# Patient Record
Sex: Male | Born: 1945 | Race: White | Hispanic: No | Marital: Married | Smoking: Former smoker
Health system: Southern US, Community
[De-identification: ages and names within clinical notes are randomized; demographics above are authoritative.]

## PROBLEM LIST (undated history)

## (undated) DIAGNOSIS — I2510 Atherosclerotic heart disease of native coronary artery without angina pectoris: Secondary | ICD-10-CM

## (undated) DIAGNOSIS — R739 Hyperglycemia, unspecified: Secondary | ICD-10-CM

## (undated) DIAGNOSIS — R7612 Nonspecific reaction to cell mediated immunity measurement of gamma interferon antigen response without active tuberculosis: Secondary | ICD-10-CM

## (undated) DIAGNOSIS — E785 Hyperlipidemia, unspecified: Secondary | ICD-10-CM

## (undated) DIAGNOSIS — R300 Dysuria: Secondary | ICD-10-CM

## (undated) DIAGNOSIS — M25552 Pain in left hip: Secondary | ICD-10-CM

## (undated) DIAGNOSIS — M353 Polymyalgia rheumatica: Secondary | ICD-10-CM

## (undated) DIAGNOSIS — E119 Type 2 diabetes mellitus without complications: Secondary | ICD-10-CM

## (undated) DIAGNOSIS — D689 Coagulation defect, unspecified: Principal | ICD-10-CM

## (undated) DIAGNOSIS — F419 Anxiety disorder, unspecified: Secondary | ICD-10-CM

## (undated) DIAGNOSIS — T7840XA Allergy, unspecified, initial encounter: Secondary | ICD-10-CM

## (undated) DIAGNOSIS — I82409 Acute embolism and thrombosis of unspecified deep veins of unspecified lower extremity: Secondary | ICD-10-CM

## (undated) DIAGNOSIS — Z7901 Long term (current) use of anticoagulants: Principal | ICD-10-CM

## (undated) DIAGNOSIS — G4700 Insomnia, unspecified: Secondary | ICD-10-CM

## (undated) DIAGNOSIS — G4730 Sleep apnea, unspecified: Secondary | ICD-10-CM

## (undated) DIAGNOSIS — E559 Vitamin D deficiency, unspecified: Secondary | ICD-10-CM

## (undated) DIAGNOSIS — E041 Nontoxic single thyroid nodule: Secondary | ICD-10-CM

## (undated) DIAGNOSIS — R05 Cough: Secondary | ICD-10-CM

## (undated) DIAGNOSIS — J3489 Other specified disorders of nose and nasal sinuses: Secondary | ICD-10-CM

## (undated) DIAGNOSIS — N529 Male erectile dysfunction, unspecified: Secondary | ICD-10-CM

## (undated) DIAGNOSIS — D6859 Other primary thrombophilia: Secondary | ICD-10-CM

## (undated) DIAGNOSIS — R3911 Hesitancy of micturition: Secondary | ICD-10-CM

## (undated) DIAGNOSIS — I739 Peripheral vascular disease, unspecified: Secondary | ICD-10-CM

## (undated) DIAGNOSIS — B029 Zoster without complications: Secondary | ICD-10-CM

## (undated) DIAGNOSIS — L089 Local infection of the skin and subcutaneous tissue, unspecified: Secondary | ICD-10-CM

## (undated) DIAGNOSIS — M359 Systemic involvement of connective tissue, unspecified: Secondary | ICD-10-CM

## (undated) DIAGNOSIS — G3184 Mild cognitive impairment, so stated: Secondary | ICD-10-CM

## (undated) DIAGNOSIS — K219 Gastro-esophageal reflux disease without esophagitis: Secondary | ICD-10-CM

## (undated) DIAGNOSIS — E039 Hypothyroidism, unspecified: Secondary | ICD-10-CM

## (undated) DIAGNOSIS — Z0000 Encounter for general adult medical examination without abnormal findings: Secondary | ICD-10-CM

## (undated) DIAGNOSIS — B958 Unspecified staphylococcus as the cause of diseases classified elsewhere: Secondary | ICD-10-CM

## (undated) HISTORY — DX: Unspecified staphylococcus as the cause of diseases classified elsewhere: B95.8

## (undated) HISTORY — DX: Other primary thrombophilia: D68.59

## (undated) HISTORY — DX: Hypothyroidism, unspecified: E03.9

## (undated) HISTORY — DX: Mild cognitive impairment, so stated: G31.84

## (undated) HISTORY — DX: Anxiety disorder, unspecified: F41.9

## (undated) HISTORY — DX: Nontoxic single thyroid nodule: E04.1

## (undated) HISTORY — DX: Insomnia, unspecified: G47.00

## (undated) HISTORY — DX: Polymyalgia rheumatica: M35.3

## (undated) HISTORY — DX: Coagulation defect, unspecified: D68.9

## (undated) HISTORY — DX: Dysuria: R30.0

## (undated) HISTORY — PX: OTHER SURGICAL HISTORY: SHX169

## (undated) HISTORY — DX: Pain in left hip: M25.552

## (undated) HISTORY — DX: Allergy, unspecified, initial encounter: T78.40XA

## (undated) HISTORY — DX: Nonspecific reaction to cell mediated immunity measurement of gamma interferon antigen response without active tuberculosis: R76.12

## (undated) HISTORY — DX: Hesitancy of micturition: R39.11

## (undated) HISTORY — DX: Hyperlipidemia, unspecified: E78.5

## (undated) HISTORY — DX: Zoster without complications: B02.9

## (undated) HISTORY — DX: Local infection of the skin and subcutaneous tissue, unspecified: L08.9

## (undated) HISTORY — DX: Encounter for general adult medical examination without abnormal findings: Z00.00

## (undated) HISTORY — DX: Cough: R05

## (undated) HISTORY — DX: Long term (current) use of anticoagulants: Z79.01

## (undated) HISTORY — DX: Acute embolism and thrombosis of unspecified deep veins of unspecified lower extremity: I82.409

## (undated) HISTORY — DX: Gastro-esophageal reflux disease without esophagitis: K21.9

## (undated) HISTORY — DX: Other specified disorders of nose and nasal sinuses: J34.89

## (undated) HISTORY — DX: Vitamin D deficiency, unspecified: E55.9

## (undated) HISTORY — PX: COLONOSCOPY: SHX174

## (undated) HISTORY — DX: Male erectile dysfunction, unspecified: N52.9

## (undated) HISTORY — PX: CATARACT EXTRACTION: SUR2

## (undated) HISTORY — DX: Hyperglycemia, unspecified: R73.9

---

## 1980-03-03 DIAGNOSIS — B029 Zoster without complications: Secondary | ICD-10-CM

## 1980-03-03 HISTORY — DX: Zoster without complications: B02.9

## 2003-02-27 ENCOUNTER — Ambulatory Visit (HOSPITAL_COMMUNITY): Admission: RE | Admit: 2003-02-27 | Discharge: 2003-02-27 | Payer: Self-pay | Admitting: Orthopedic Surgery

## 2003-03-23 ENCOUNTER — Ambulatory Visit (HOSPITAL_BASED_OUTPATIENT_CLINIC_OR_DEPARTMENT_OTHER): Admission: RE | Admit: 2003-03-23 | Discharge: 2003-03-23 | Payer: Self-pay | Admitting: Orthopedic Surgery

## 2003-03-23 ENCOUNTER — Ambulatory Visit (HOSPITAL_COMMUNITY): Admission: RE | Admit: 2003-03-23 | Discharge: 2003-03-23 | Payer: Self-pay | Admitting: Orthopedic Surgery

## 2003-03-24 ENCOUNTER — Encounter (INDEPENDENT_AMBULATORY_CARE_PROVIDER_SITE_OTHER): Payer: Self-pay | Admitting: *Deleted

## 2005-11-19 ENCOUNTER — Encounter: Admission: RE | Admit: 2005-11-19 | Discharge: 2005-11-19 | Payer: Self-pay | Admitting: Family Medicine

## 2007-11-16 ENCOUNTER — Encounter: Admission: RE | Admit: 2007-11-16 | Discharge: 2007-11-16 | Payer: Self-pay | Admitting: Unknown Physician Specialty

## 2008-08-29 ENCOUNTER — Encounter: Admission: RE | Admit: 2008-08-29 | Discharge: 2008-08-29 | Payer: Self-pay | Admitting: Orthopedic Surgery

## 2008-09-08 ENCOUNTER — Ambulatory Visit: Payer: Self-pay | Admitting: Oncology

## 2008-10-20 ENCOUNTER — Encounter: Admission: RE | Admit: 2008-10-20 | Discharge: 2008-10-20 | Payer: Self-pay | Admitting: Internal Medicine

## 2008-10-23 ENCOUNTER — Ambulatory Visit: Payer: Self-pay | Admitting: Oncology

## 2008-10-24 LAB — CBC & DIFF AND RETIC
Basophils Absolute: 0 10*3/uL (ref 0.0–0.1)
HCT: 44.3 % (ref 38.4–49.9)
HGB: 15.3 g/dL (ref 13.0–17.1)
Immature Retic Fract: 8.5 % (ref 0.00–13.40)
LYMPH%: 38.1 % (ref 14.0–49.0)
MCHC: 34.5 g/dL (ref 32.0–36.0)
MONO#: 0.3 10*3/uL (ref 0.1–0.9)
NEUT%: 54.9 % (ref 39.0–75.0)
Platelets: 180 10*3/uL (ref 140–400)
Retic Ct Abs: 54.17 10*3/uL (ref 24.10–77.50)
WBC: 5.6 10*3/uL (ref 4.0–10.3)
lymph#: 2.2 10*3/uL (ref 0.9–3.3)

## 2008-10-24 LAB — CHCC SMEAR

## 2008-10-24 LAB — MORPHOLOGY

## 2008-10-28 LAB — LUPUS ANTICOAGULANT PANEL
DRVVT 1:1 Mix: 38.7 secs (ref 36.1–47.0)
DRVVT: 75.7 secs — ABNORMAL HIGH (ref 36.1–47.0)
PTT Lupus Anticoagulant: 54.8 secs — ABNORMAL HIGH (ref 36.3–48.8)

## 2008-10-28 LAB — BETA-2 GLYCOPROTEIN ANTIBODIES: Beta-2-Glycoprotein I IgA: <10

## 2008-10-28 LAB — FACTOR 5 LEIDEN

## 2008-10-28 LAB — ANTITHROMBIN III: AntiThromb III Func: 102 % (ref 76–126)

## 2008-10-28 LAB — CARDIOLIPIN ANTIBODIES, IGG, IGM, IGA
Anticardiolipin IgA: <10
Anticardiolipin IgM: <10

## 2008-10-28 LAB — PROTHROMBIN GENE MUTATION

## 2009-04-19 ENCOUNTER — Ambulatory Visit: Payer: Self-pay | Admitting: Psychology

## 2009-04-20 ENCOUNTER — Ambulatory Visit: Payer: Self-pay | Admitting: Oncology

## 2009-04-24 LAB — CBC WITH DIFFERENTIAL/PLATELET
BASO%: 0.2 % (ref 0.0–2.0)
Basophils Absolute: 0 10*3/uL (ref 0.0–0.1)
EOS%: 2.1 % (ref 0.0–7.0)
Eosinophils Absolute: 0.1 10*3/uL (ref 0.0–0.5)
HCT: 45.2 % (ref 38.4–49.9)
HGB: 15.3 g/dL (ref 13.0–17.1)
LYMPH%: 19.6 % (ref 14.0–49.0)
MCH: 30.4 pg (ref 27.2–33.4)
MCHC: 33.7 g/dL (ref 32.0–36.0)
MCV: 90.1 fL (ref 79.3–98.0)
MONO#: 0.7 10*3/uL (ref 0.1–0.9)
MONO%: 9.5 % (ref 0.0–14.0)
NEUT#: 4.8 10*3/uL (ref 1.5–6.5)
NEUT%: 68.6 % (ref 39.0–75.0)
Platelets: 182 10*3/uL (ref 140–400)
RBC: 5.02 10*6/uL (ref 4.20–5.82)
RDW: 14.4 % (ref 11.0–14.6)
WBC: 7 10*3/uL (ref 4.0–10.3)
lymph#: 1.4 10*3/uL (ref 0.9–3.3)

## 2009-04-24 LAB — PROTIME-INR
INR: 2.7 (ref 2.00–3.50)
Protime: 32.4 Seconds — ABNORMAL HIGH (ref 10.6–13.4)

## 2009-04-25 LAB — FACTOR 8 ASSAY: Coagulation Factor VIII: 203 % — ABNORMAL HIGH (ref 73–140)

## 2009-04-25 LAB — D-DIMER, QUANTITATIVE: D-Dimer, Quant: 1.15 ug/mL-FEU — ABNORMAL HIGH (ref 0.00–0.48)

## 2009-06-21 ENCOUNTER — Ambulatory Visit: Payer: Self-pay | Admitting: Oncology

## 2009-06-22 LAB — CBC WITH DIFFERENTIAL/PLATELET
BASO%: 0.3 % (ref 0.0–2.0)
Basophils Absolute: 0 10*3/uL (ref 0.0–0.1)
EOS%: 3.2 % (ref 0.0–7.0)
Eosinophils Absolute: 0.2 10*3/uL (ref 0.0–0.5)
HCT: 45.8 % (ref 38.4–49.9)
HGB: 15.6 g/dL (ref 13.0–17.1)
LYMPH%: 28.7 % (ref 14.0–49.0)
MCH: 30.9 pg (ref 27.2–33.4)
MCHC: 34.1 g/dL (ref 32.0–36.0)
MCV: 90.7 fL (ref 79.3–98.0)
MONO#: 0.4 10*3/uL (ref 0.1–0.9)
MONO%: 5.5 % (ref 0.0–14.0)
NEUT#: 4.8 10*3/uL (ref 1.5–6.5)
NEUT%: 62.3 % (ref 39.0–75.0)
Platelets: 192 10*3/uL (ref 140–400)
RBC: 5.06 10*6/uL (ref 4.20–5.82)
RDW: 14.4 % (ref 11.0–14.6)
WBC: 7.7 10*3/uL (ref 4.0–10.3)
lymph#: 2.2 10*3/uL (ref 0.9–3.3)

## 2009-06-22 LAB — PROTIME-INR
INR: 2.3 (ref 2.00–3.50)
Protime: 27.6 Seconds — ABNORMAL HIGH (ref 10.6–13.4)

## 2009-06-23 LAB — D-DIMER, QUANTITATIVE: D-Dimer, Quant: 0.92 ug/mL-FEU — ABNORMAL HIGH (ref 0.00–0.48)

## 2009-07-23 ENCOUNTER — Ambulatory Visit: Payer: Self-pay | Admitting: Oncology

## 2009-07-24 LAB — PROTIME-INR
INR: 1.4 — ABNORMAL LOW (ref 2.00–3.50)
Protime: 16.8 Seconds — ABNORMAL HIGH (ref 10.6–13.4)

## 2009-08-02 LAB — PROTIME-INR
INR: 2 (ref 2.00–3.50)
Protime: 24 Seconds — ABNORMAL HIGH (ref 10.6–13.4)

## 2009-09-27 ENCOUNTER — Ambulatory Visit: Payer: Self-pay | Admitting: Oncology

## 2009-10-12 LAB — CBC WITH DIFFERENTIAL/PLATELET
BASO%: 0.4 % (ref 0.0–2.0)
Basophils Absolute: 0 10*3/uL (ref 0.0–0.1)
EOS%: 2.2 % (ref 0.0–7.0)
Eosinophils Absolute: 0.1 10*3/uL (ref 0.0–0.5)
HCT: 42.7 % (ref 38.4–49.9)
HGB: 14.5 g/dL (ref 13.0–17.1)
LYMPH%: 31.5 % (ref 14.0–49.0)
MCH: 31.5 pg (ref 27.2–33.4)
MCHC: 33.9 g/dL (ref 32.0–36.0)
MCV: 92.9 fL (ref 79.3–98.0)
MONO#: 0.4 10*3/uL (ref 0.1–0.9)
MONO%: 6.2 % (ref 0.0–14.0)
NEUT#: 3.7 10*3/uL (ref 1.5–6.5)
NEUT%: 59.7 % (ref 39.0–75.0)
Platelets: 182 10*3/uL (ref 140–400)
RBC: 4.6 10*6/uL (ref 4.20–5.82)
RDW: 14.6 % (ref 11.0–14.6)
WBC: 6.3 10*3/uL (ref 4.0–10.3)
lymph#: 2 10*3/uL (ref 0.9–3.3)

## 2009-10-16 LAB — FIBRINOGEN: Fibrinogen: 270 mg/dL (ref 204–475)

## 2009-10-16 LAB — D-DIMER, QUANTITATIVE: D-Dimer, Quant: 0.68 ug/mL-FEU — ABNORMAL HIGH (ref 0.00–0.48)

## 2009-10-26 ENCOUNTER — Ambulatory Visit (HOSPITAL_BASED_OUTPATIENT_CLINIC_OR_DEPARTMENT_OTHER): Admission: RE | Admit: 2009-10-26 | Discharge: 2009-10-26 | Payer: Self-pay | Admitting: Orthopedic Surgery

## 2009-11-07 ENCOUNTER — Ambulatory Visit: Payer: Self-pay | Admitting: Oncology

## 2009-11-16 LAB — PROTEIN C, TOTAL: Protein C, Total: 114 % (ref 70–140)

## 2009-11-16 LAB — PROTEIN C ACTIVITY: Protein C Activity: 154 % — ABNORMAL HIGH (ref 75–133)

## 2009-11-16 LAB — PROTEIN S, ANTIGEN, FREE: Protein S Ag, Free: 60 % normal (ref 57–171)

## 2009-11-16 LAB — PROTEIN S, TOTAL: Protein S Ag, Total: 82 % (ref 70–140)

## 2009-11-16 LAB — PROTEIN S ACTIVITY: Protein S Activity: 48 % — ABNORMAL LOW (ref 69–129)

## 2009-11-16 LAB — FACTOR 8 ASSAY: Coagulation Factor VIII: 201 % — ABNORMAL HIGH (ref 73–140)

## 2009-12-28 ENCOUNTER — Ambulatory Visit: Payer: Self-pay | Admitting: Oncology

## 2010-01-04 LAB — PROTEIN S, TOTAL: Protein S Ag, Total: 87 % (ref 70–140)

## 2010-01-04 LAB — PROTEIN S, ANTIGEN, FREE: Protein S Ag, Free: 60 % normal (ref 57–171)

## 2010-01-04 LAB — PROTEIN S ACTIVITY: Protein S Activity: 48 % — ABNORMAL LOW (ref 69–129)

## 2010-01-04 LAB — D-DIMER, QUANTITATIVE: D-Dimer, Quant: 1.15 ug/mL-FEU — ABNORMAL HIGH (ref 0.00–0.48)

## 2010-02-01 ENCOUNTER — Ambulatory Visit: Payer: Self-pay | Admitting: Oncology

## 2010-02-04 LAB — PROTIME-INR
INR: 1.4 — ABNORMAL LOW (ref 2.00–3.50)
Protime: 16.8 Seconds — ABNORMAL HIGH (ref 10.6–13.4)

## 2010-04-26 ENCOUNTER — Other Ambulatory Visit (HOSPITAL_COMMUNITY): Payer: Self-pay | Admitting: Orthopedic Surgery

## 2010-04-26 DIAGNOSIS — Q2730 Arteriovenous malformation, site unspecified: Secondary | ICD-10-CM

## 2010-05-01 ENCOUNTER — Ambulatory Visit (HOSPITAL_COMMUNITY)
Admission: RE | Admit: 2010-05-01 | Discharge: 2010-05-01 | Disposition: A | Discharge status: Home / Self Care | Payer: BC Managed Care – PPO | Source: Ambulatory Visit | Attending: Orthopedic Surgery | Admitting: Orthopedic Surgery

## 2010-05-01 DIAGNOSIS — I770 Arteriovenous fistula, acquired: Secondary | ICD-10-CM | POA: Insufficient documentation

## 2010-05-01 DIAGNOSIS — Q2730 Arteriovenous malformation, site unspecified: Secondary | ICD-10-CM

## 2010-05-01 DIAGNOSIS — S68118A Complete traumatic metacarpophalangeal amputation of other finger, initial encounter: Secondary | ICD-10-CM | POA: Insufficient documentation

## 2010-05-01 MED ORDER — GADOBENATE DIMEGLUMINE 529 MG/ML IV SOLN
10.0000 mL | Freq: Once | INTRAVENOUS | Status: AC
  Administered 2010-05-01: 10 mL via INTRAVENOUS

## 2010-05-17 LAB — PROTIME-INR
INR: 0.96 (ref 0.00–1.49)
Prothrombin Time: 13 seconds (ref 11.6–15.2)

## 2010-05-17 LAB — APTT: aPTT: 27 s (ref 24–37)

## 2010-05-17 LAB — POCT HEMOGLOBIN-HEMACUE: Hemoglobin: 15.6 g/dL (ref 13.0–17.0)

## 2010-07-05 ENCOUNTER — Other Ambulatory Visit: Payer: Self-pay | Admitting: Oncology

## 2010-07-05 ENCOUNTER — Ambulatory Visit (HOSPITAL_COMMUNITY)
Admission: RE | Admit: 2010-07-05 | Discharge: 2010-07-05 | Disposition: A | Discharge status: Home / Self Care | Payer: BC Managed Care – PPO | Source: Ambulatory Visit | Attending: Oncology | Admitting: Oncology

## 2010-07-05 ENCOUNTER — Encounter (HOSPITAL_BASED_OUTPATIENT_CLINIC_OR_DEPARTMENT_OTHER): Payer: BC Managed Care – PPO | Admitting: Oncology

## 2010-07-05 DIAGNOSIS — Z86718 Personal history of other venous thrombosis and embolism: Secondary | ICD-10-CM

## 2010-07-05 DIAGNOSIS — I82409 Acute embolism and thrombosis of unspecified deep veins of unspecified lower extremity: Secondary | ICD-10-CM

## 2010-07-05 DIAGNOSIS — M79609 Pain in unspecified limb: Secondary | ICD-10-CM | POA: Insufficient documentation

## 2010-07-05 DIAGNOSIS — Z7901 Long term (current) use of anticoagulants: Secondary | ICD-10-CM

## 2010-07-05 LAB — CBC WITH DIFFERENTIAL/PLATELET
BASO%: 0.3 % (ref 0.0–2.0)
Basophils Absolute: 0 10*3/uL (ref 0.0–0.1)
EOS%: 2.5 % (ref 0.0–7.0)
Eosinophils Absolute: 0.2 10*3/uL (ref 0.0–0.5)
HCT: 45.2 % (ref 38.4–49.9)
HGB: 15.3 g/dL (ref 13.0–17.1)
LYMPH%: 24.7 % (ref 14.0–49.0)
MCH: 31.2 pg (ref 27.2–33.4)
MCHC: 33.8 g/dL (ref 32.0–36.0)
MCV: 92.3 fL (ref 79.3–98.0)
MONO#: 0.3 10*3/uL (ref 0.1–0.9)
MONO%: 4.9 % (ref 0.0–14.0)
NEUT#: 4.1 10*3/uL (ref 1.5–6.5)
NEUT%: 67.6 % (ref 39.0–75.0)
Platelets: 186 10*3/uL (ref 140–400)
RBC: 4.9 10*6/uL (ref 4.20–5.82)
RDW: 14.1 % (ref 11.0–14.6)
WBC: 6.1 10*3/uL (ref 4.0–10.3)
lymph#: 1.5 10*3/uL (ref 0.9–3.3)

## 2010-07-05 LAB — PROTIME-INR
INR: 2.5 (ref 2.00–3.50)
Protime: 30 Seconds — ABNORMAL HIGH (ref 10.6–13.4)

## 2010-07-05 LAB — D-DIMER, QUANTITATIVE: D-Dimer, Quant: 1 ug/mL-FEU — ABNORMAL HIGH (ref 0.00–0.48)

## 2010-07-19 NOTE — Op Note (Signed)
NAMECLAIR, Paul Ryan                               ACCOUNT NO.:  [id number]   MEDICAL RECORD NO.:  [id number]                   PATIENT TYPE:  AMB   LOCATION:  DSC                                  FACILITY:  [facility]   PHYSICIAN:  Katy Fitch. Naaman Plummer., M.D.          DATE OF BIRTH:  [birth year]   DATE OF PROCEDURE:  03/23/2003  DATE OF DISCHARGE:                                 OPERATIVE REPORT   PREOPERATIVE DIAGNOSIS:  Persistent/recurrent arteriovenous malformation  left hand involving radial aspect of the long finger metacarpal phalangeal  joint and long thumb web space status post ray resection for the same lesion  performed in 1971 in [place], [place].   POSTOPERATIVE DIAGNOSIS:  Persistent/recurrent arteriovenous malformation  left hand involving radial aspect of the long finger metacarpal phalangeal  joint and long thumb web space status post ray resection for the same lesion  performed in 1971 in [place], [place], with identification of extensive  dermal infiltration.   PROCEDURE:  1. Subtotal resection of skin overlying AV malformation as well as excision     of a portion of the former ulnar proper digital artery to the excised     index finger as well as near total marginal resection of visible     arteriovenous malformation.  2. Multiple Z-plasties for thumb and long finger web deepening and release     of scar web contracture.   SURGEON:  Katy Fitch. Sypher, M.D.   ASSISTANT:  Annye Rusk, P.A.   ANESTHESIA:  Axillary block.   ANESTHESIOLOGIST:  Quita Skye. Krista Blue, M.D.   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  This apparently was a predicament that was growing and causing symptomatic  impairment of the left hand.  He was initially treated for this lesion in  [place], [place], with a ray  resection at the level of the diaphysis of his  left index finger performed in 1971.  In the early postoperative period, he  had good function of his hand.  During the ensuing 30 years, he began to  notice some discoloration of the skin on the radial aspect of the long  finger just distal to the metacarpal phalangeal joint.  During the past  year, he had noted the progressive enlargement of what appeared to be a  vascular soft tissue mass on the radial aspect of his long finger metacarpal  phalangeal joint and T1 segment.  This was nontender but pulsatile.  He  brought this to the attention of his family physician, Dr. Smith Mince, and is  referred for an upper extremity orthopedic consult.   On February 13, 2003, he was seen in consultation and noted to have what  appeared to be a complex arteriovenous malformation.  We  had a lengthy  discussion regarding the difficulties in managing these lesions.  They are  often highly infiltrative and essentially dispersed throughout a limb.  On  exam, his appeared to be fairly discrete but clearly involved the common  digital artery and proper digital artery region to the radial aspect of the  long finger.  He was referred for an MRI of the hand with contrast and an MR  angiogram.  These studies were interpreted by Dr. Jena Gauss, radiologist, on  February 27, 2003.  Dr. Jena Gauss noted a relatively well circumscribed  vascular lesion that had clear arterial supply from the palmar digital  artery on the radial aspect of the third metacarpal and also some  contributions from the dorsal and palmar digital arteries of the second  metacarpal region.  Prominent draining veins were noted.   Clinically, this appeared to be a lesion that did infiltrate the dermis,  however, it appeared that a portion of the skin on the palmar radial aspect  of the long finger could be sacrificed with the lesion and allow for a near  total resection.  After a lengthy discussion on  March 01, 2003, Paul Ryan  elects to come to the operating room at this time for an attempt to debulk  and/or excise his arteriovenous malformation.  After informed consent, we  proceed.   PROCEDURE:  Paul Ryan is brought to the operating room and placed on supine  position on the operating table.  Following induction of axillary block by  Dr. Krista Blue in the holding area, anesthesia was complete in the left arm.  Paul Ryan was transported to the operating room and placed in supine position  on the operating table.  The left arm was placed on an arm table and a  pneumatic tourniquet was applied to the proximal brachium.  1 gram of Ancef  was administered as an IV prophylactic antibiotic.  Following exsanguination  of the limb with an Esmarch bandage, the arterial tourniquet on the proximal  brachium was inflated to 220 mmHg.  The procedure commenced with planning an  elliptical skin excision overlying the mass in an effort to remove the  abnormal vessels that had grown within the dermis.  The proximal and distal  limits of the prior surgical incision were extended to provide dissection in  a virgin plane to identify the radial proper digital artery and nerve of the  long finger and the common digital artery serving the index and long fingers  in the palm.  The skin incisions were taken sharply and immediately it was  apparent that the AV malformation diffusely involved the deep surface of the  dermis on a relatively extensive portion of the palmar skin overlying the  long finger metacarpal.  The radial carpal digital artery and nerve were  identified in the long finger distally and the thumb long finger web space  was carefully explored with release of multiple scar webs and the ligament  remnants allowing identification of the common digital vessel.  A very dense  scar was meticulously dissected with fine tenotomy scissors, 64 beaver blade, loupe magnification, and considerable use of a fine  hemostat for  spreading.  I was ultimately able to follow the radial proper digital artery  and nerve of the long finger down to the branch point where the common  digital vessels formally extending to the index finger.  It is clear that  the arteriovenous malformation involved the bifurcation of this region  rendering complete  eradication not a technically feasible option unless  sacrifice of the digital artery to the radial aspect of the long finger was  anticipated or bypassed grafting with the vein graft anticipated.  Given the  fact that the bulk of the lesion appeared to be confined to the subcutaneous  space and the subdermal and intradermal area, I performed a near 100%  resection by undermining the dermis with sharp dissection and a rongeur  followed by careful stripping of all the abnormal appearing vessels leaving  the proper digital artery to the long finger intact.  There was a small  plexus of vessels adjacent to the lumbrical to the long finger that was  resected.  The proximal feeding vessels were identified and  electrocauterized.  After removal of the lesion, the thumb, long finger web  was rather tight.  A full flap Z-plasty was created deepening the web and  lengthening the scar.  A small dart was taken distally to smooth the  proximal finger flexion crease.  The Z-plasty flaps were transposed and the  tourniquet released.  There was good capillary refill in the skin as well as  minimal bleeding.  There was noted to be some bleeding from the marginal  resection along the palmar aspect of the long finger which was due to  abnormal vessels within the dermis.  In my judgment, sacrifice of the skin  was not indicated given the nature of this lesion.  Electrocautery was used  to obtain hemostasis and coagulation of the abnormal vessels was  accomplished.  After thorough irrigation of the wound with sterile saline,  no pathologic bleeding points were identified.  The wound was  then closed  with intradermal 3-0 Prolene along the radial mid lateral incision on the  long finger followed by inset of the Z-plasties with multiple mattress  sutures of 5-0 nylon.  A very satisfactory thumb long finger web deepening  was accomplished.  The wounds were dressed with Silvadene, Xeroform, sterile  gauze, acrylic fluff, and Ace wrap.  There were no apparent complications.   Paul Ryan is advised to return for follow up at our office in approximately  six days for dressing change and examination of his wound margins.  His  lesion was passed off in formalin for pathologic evaluation.  We will  converse with the pathologist regarding the nature of the arteriovenous  malformation and suggestions for further care.                                               Katy Fitch Naaman Plummer., M.D.    RVS/MEDQ  D:  03/23/2003  T:  03/23/2003  Job:  [id number]   cc:   Talmadge Coventry, M.D.  [address]  [place] [address]  Fax: [phone]

## 2010-10-11 ENCOUNTER — Other Ambulatory Visit: Payer: Self-pay | Admitting: Oncology

## 2010-10-11 ENCOUNTER — Encounter (HOSPITAL_BASED_OUTPATIENT_CLINIC_OR_DEPARTMENT_OTHER): Payer: BC Managed Care – PPO | Admitting: Oncology

## 2010-10-11 DIAGNOSIS — Z7901 Long term (current) use of anticoagulants: Secondary | ICD-10-CM

## 2010-10-11 DIAGNOSIS — I82409 Acute embolism and thrombosis of unspecified deep veins of unspecified lower extremity: Secondary | ICD-10-CM

## 2010-10-11 DIAGNOSIS — Z86718 Personal history of other venous thrombosis and embolism: Secondary | ICD-10-CM

## 2010-10-11 LAB — CBC WITH DIFFERENTIAL/PLATELET
BASO%: 0.3 % (ref 0.0–2.0)
Basophils Absolute: 0 10*3/uL (ref 0.0–0.1)
EOS%: 2.9 % (ref 0.0–7.0)
Eosinophils Absolute: 0.2 10*3/uL (ref 0.0–0.5)
HCT: 44 % (ref 38.4–49.9)
HGB: 14.8 g/dL (ref 13.0–17.1)
LYMPH%: 30.8 % (ref 14.0–49.0)
MCH: 30.7 pg (ref 27.2–33.4)
MCHC: 33.6 g/dL (ref 32.0–36.0)
MCV: 91.3 fL (ref 79.3–98.0)
MONO#: 0.5 10*3/uL (ref 0.1–0.9)
MONO%: 7.3 % (ref 0.0–14.0)
NEUT#: 3.6 10*3/uL (ref 1.5–6.5)
NEUT%: 58.7 % (ref 39.0–75.0)
Platelets: 173 10*3/uL (ref 140–400)
RBC: 4.82 10*6/uL (ref 4.20–5.82)
RDW: 14.3 % (ref 11.0–14.6)
WBC: 6.1 10*3/uL (ref 4.0–10.3)
lymph#: 1.9 10*3/uL (ref 0.9–3.3)
nRBC: 0 % (ref 0–0)

## 2010-10-11 LAB — PROTIME-INR
INR: 2 (ref 2.00–3.50)
Protime: 24 Seconds — ABNORMAL HIGH (ref 10.6–13.4)

## 2010-10-12 LAB — D-DIMER, QUANTITATIVE: D-Dimer, Quant: 0.89 ug/mL-FEU — ABNORMAL HIGH (ref 0.00–0.48)

## 2010-10-29 ENCOUNTER — Encounter (HOSPITAL_BASED_OUTPATIENT_CLINIC_OR_DEPARTMENT_OTHER): Payer: BC Managed Care – PPO | Admitting: Oncology

## 2010-10-29 ENCOUNTER — Other Ambulatory Visit: Payer: Self-pay | Admitting: Oncology

## 2010-10-29 DIAGNOSIS — Z86718 Personal history of other venous thrombosis and embolism: Secondary | ICD-10-CM

## 2010-10-29 DIAGNOSIS — Z7901 Long term (current) use of anticoagulants: Secondary | ICD-10-CM

## 2010-10-29 DIAGNOSIS — I82409 Acute embolism and thrombosis of unspecified deep veins of unspecified lower extremity: Secondary | ICD-10-CM

## 2010-10-29 LAB — PROTIME-INR

## 2010-11-08 ENCOUNTER — Encounter (HOSPITAL_BASED_OUTPATIENT_CLINIC_OR_DEPARTMENT_OTHER): Payer: BC Managed Care – PPO | Admitting: Oncology

## 2010-11-08 ENCOUNTER — Other Ambulatory Visit: Payer: Self-pay | Admitting: Oncology

## 2010-11-08 DIAGNOSIS — Z5181 Encounter for therapeutic drug level monitoring: Secondary | ICD-10-CM

## 2010-11-08 DIAGNOSIS — Z86718 Personal history of other venous thrombosis and embolism: Secondary | ICD-10-CM

## 2010-11-08 DIAGNOSIS — Z7901 Long term (current) use of anticoagulants: Secondary | ICD-10-CM

## 2010-11-08 LAB — PROTIME-INR: Protime: 21.6 Seconds — ABNORMAL HIGH (ref 10.6–13.4)

## 2010-11-29 ENCOUNTER — Other Ambulatory Visit: Payer: Self-pay | Admitting: Oncology

## 2010-11-29 ENCOUNTER — Encounter (HOSPITAL_BASED_OUTPATIENT_CLINIC_OR_DEPARTMENT_OTHER): Payer: BC Managed Care – PPO | Admitting: Oncology

## 2010-11-29 DIAGNOSIS — I82409 Acute embolism and thrombosis of unspecified deep veins of unspecified lower extremity: Secondary | ICD-10-CM

## 2010-11-29 DIAGNOSIS — Z7901 Long term (current) use of anticoagulants: Secondary | ICD-10-CM

## 2010-11-29 DIAGNOSIS — Z86718 Personal history of other venous thrombosis and embolism: Secondary | ICD-10-CM

## 2010-11-29 LAB — PROTIME-INR
INR: 2.8 (ref 2.00–3.50)
Protime: 33.6 Seconds — ABNORMAL HIGH (ref 10.6–13.4)

## 2010-12-17 ENCOUNTER — Encounter: Payer: Self-pay | Admitting: Family Medicine

## 2010-12-17 ENCOUNTER — Ambulatory Visit (INDEPENDENT_AMBULATORY_CARE_PROVIDER_SITE_OTHER): Payer: BC Managed Care – PPO | Admitting: Family Medicine

## 2010-12-17 VITALS — BP 98/62 | HR 102 | Temp 97.8°F | Ht 71.0 in | Wt 157.8 lb

## 2010-12-17 DIAGNOSIS — Z719 Counseling, unspecified: Secondary | ICD-10-CM | POA: Insufficient documentation

## 2010-12-17 DIAGNOSIS — B029 Zoster without complications: Secondary | ICD-10-CM | POA: Insufficient documentation

## 2010-12-17 DIAGNOSIS — T7840XA Allergy, unspecified, initial encounter: Secondary | ICD-10-CM

## 2010-12-17 DIAGNOSIS — M25552 Pain in left hip: Secondary | ICD-10-CM | POA: Insufficient documentation

## 2010-12-17 DIAGNOSIS — I82409 Acute embolism and thrombosis of unspecified deep veins of unspecified lower extremity: Secondary | ICD-10-CM

## 2010-12-17 DIAGNOSIS — Z23 Encounter for immunization: Secondary | ICD-10-CM

## 2010-12-17 DIAGNOSIS — E785 Hyperlipidemia, unspecified: Secondary | ICD-10-CM | POA: Insufficient documentation

## 2010-12-17 DIAGNOSIS — M25559 Pain in unspecified hip: Secondary | ICD-10-CM

## 2010-12-17 DIAGNOSIS — Z0000 Encounter for general adult medical examination without abnormal findings: Secondary | ICD-10-CM

## 2010-12-17 HISTORY — DX: Encounter for general adult medical examination without abnormal findings: Z00.00

## 2010-12-17 HISTORY — DX: Pain in left hip: M25.552

## 2010-12-17 MED ORDER — TETANUS-DIPHTH-ACELL PERTUSSIS 5-2.5-18.5 LF-MCG/0.5 IM SUSP: 0.5000 mL | Freq: Once | INTRAMUSCULAR | Status: DC

## 2010-12-17 NOTE — Assessment & Plan Note (Signed)
Had an episode years ago and since then has had a Zostavax, we will request records from his previous PMDs

## 2010-12-17 NOTE — Assessment & Plan Note (Signed)
Uses Zyrtec prn, may take daily and a second dose prn for severe allergies, saline nasal spray prn

## 2010-12-17 NOTE — Assessment & Plan Note (Signed)
Pain is worst when he sleeps on that hip, is pained for roughly an hour upon arising then dissipates for the day, likely related to arthritic changes. May consider Glucosamine Chondroitin and Tylenol prn

## 2010-12-17 NOTE — Assessment & Plan Note (Signed)
Has been maintained on Vytorin 10/40 for 20 years, his cholesterol was never strikingly hi, maybe 210 and his most recent cholesterol was 138 as a result he has dropped his Vytorin to 1/2 tab, we will check a lipid panel in 6-8 weeks, continue fish oil and avoid trans fats.

## 2010-12-17 NOTE — Progress Notes (Signed)
Paul Ryan [id number] 1945-07-19 12/17/2010      Progress Note New Patient  Subjective  Chief Complaint  Chief Complaint  Patient presents with  . Establish Care    new patient    HPI  Patient is a 65 yo male in today for new patient appt. He is doing well at the present time. He has not had any recent acute illness, fevers, chills, HA, CP, palpitations, GI or GU c/o. Had colonoscopy in 2007. He has been struggling with left hip pain in am when he lies on it, pain last about an hour and then resolves. 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 He had a congenital vascular malformation in his left 2nd finger which resulted in an amputation and then several vascular revisions at the stump. 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  Past Medical History  Diagnosis Date  . DVT (deep venous thrombosis)     takes coumadin  . Shingles 1982    right abdominal wall  . Allergy     seasonal  . Hyperlipidemia   . Left hip pain 12/17/2010  . Preventative health care 12/17/2010    Past Surgical History  Procedure Date  . 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 Cancer Mother     unknown- everywhere  . Cancer Sister     breast    History   Social History  . Marital Status: Married    Spouse Name: N/A    Number of Children: N/A  . Years of Education: N/A   Occupational History  . Not on file.   Social History Main Topics  . Smoking status: Former Smoker -- 1.0 packs/day for 20 years    Types: Cigarettes    Quit date: 03/03/1980  . Smokeless tobacco: Never Used  . Alcohol Use: No  . Drug Use: No  . Sexually Active: Yes -- Male partner(s)   Other Topics Concern  . Not on file   Social History Narrative  . No narrative on file    No current outpatient  prescriptions on file prior to visit.   No current facility-administered medications on file prior to visit.    No Known Allergies  Review of Systems  Review of Systems  Constitutional: Negative for fever, chills and malaise/fatigue.  HENT: Negative for hearing loss, nosebleeds and congestion.   Eyes: Negative for discharge.  Respiratory: Negative for cough, sputum production, shortness of breath and wheezing.   Cardiovascular: Negative for chest pain, palpitations, claudication and leg swelling.       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 Had a congenital abnormality of the vasculature in his left second finger at birth and has had 4 surgeries to amputate the finger and control varicosities.  Gastrointestinal: Negative for heartburn, nausea, vomiting, abdominal pain, diarrhea, constipation and blood in stool.  Genitourinary: Negative for dysuria, urgency, frequency and hematuria.  Musculoskeletal: Negative for myalgias, back pain and falls.  Skin: Negative for rash.  Neurological: Negative for dizziness, tremors, sensory change, focal weakness, loss of consciousness, weakness and headaches.  Endo/Heme/Allergies: Negative for polydipsia. Does not bruise/bleed easily.  Psychiatric/Behavioral: Negative for depression and suicidal ideas. The patient is not nervous/anxious and does not have insomnia.  Objective  BP 98/62  Pulse 102  Temp(Src) 97.8 F (36.6 C) (Oral)  Ht 5\' 11"  (1.803 m)  Wt 157 lb 12.8 oz (71.578 kg)  BMI 22.01 kg/m2  SpO2 97%  Physical Exam  Physical Exam  Constitutional: He is oriented to person, place, and time and well-developed, well-nourished, and in no distress. No distress.  HENT:  Head: Normocephalic and atraumatic.  Right Ear: External ear normal.  Left Ear: External ear normal.  Nose: Nose normal.  Mouth/Throat: Oropharynx is clear and moist. No oropharyngeal exudate.  Eyes: Conjunctivae and EOM are normal. Pupils are equal, round, and  reactive to light. Right eye exhibits no discharge. Left eye exhibits no discharge. No scleral icterus.  Neck: Neck supple. No thyromegaly present.  Cardiovascular: Normal rate, regular rhythm and normal heart sounds.   No murmur heard.      Nontender, soft, nonerythematous varicosities on arms and trunk  Pulmonary/Chest: Effort normal and breath sounds normal. No respiratory distress.  Abdominal: He exhibits no distension and no mass. There is no tenderness.  Musculoskeletal: Normal range of motion. He exhibits no edema and no tenderness.  Lymphadenopathy:    He has no cervical adenopathy.  Neurological: He is alert and oriented to person, place, and time. He has normal reflexes. He displays normal reflexes. No cranial nerve deficit. Gait normal. Coordination normal.  Skin: Skin is warm.  Psychiatric: Memory, affect and judgment normal.       Assessment & Plan  Preventative health care Tdap given today, patient refuses flu shot. Encouraged heart healthy diet such as DASH diet  Shingles Had an episode years ago and since then has had a Zostavax, we will request records from his previous PMDs  Allergic state Uses Zyrtec prn, may take daily and a second dose prn for severe allergies, saline nasal spray prn  Left hip pain Pain is worst when he sleeps on that hip, is pained for roughly an hour upon arising then dissipates for the day, likely related to arthritic changes. May consider Glucosamine Chondroitin and Tylenol prn  Hyperlipidemia Has been maintained on Vytorin 10/40 for 20 years, his cholesterol was never strikingly hi, maybe 210 and his most recent cholesterol was 138 as a result he has dropped his Vytorin to 1/2 tab, we will check a lipid panel in 6-8 weeks, continue fish oil and avoid trans fats.  DVT (deep venous thrombosis) Has been maintained on Coumadin for past 2 years s/p a right Femoral artery DVT after a long plane ride. He will continue to have his PT/INR checked  by Dr Ileene Hutchinson for now.

## 2010-12-17 NOTE — Patient Instructions (Addendum)
Preventative Care for Adults, Male MAINTAIN REGULAR HEALTH EXAMS  A routine yearly physical is a good way to check in with your primary care provider about your health and preventive screening. It is also an opportunity to share updates about your health and any concerns you have and receive a thorough all-over exam.   If you smoke or chew tobacco, find out from your caregiver how to quit. It can literally save your life, no matter how long you have been a tobacco user. If you do not use tobacco, never begin.   Maintain a healthy diet and normal weight. Increased weight leads to problems with blood pressure and diabetes. Decrease saturated fat in the diet and increase regular exercise. Get information about proper diet from your caregiver if necessary. Eat a variety of foods, including fruit, vegetables, animal or vegetable protein, such as meat, fish, chicken, and eggs, or beans, lentils, tofu, and grains, such as rice.   High blood pressure causes heart and blood vessel problems. Fat leaves deposits in your arteries that can block them. This causes heart disease and vessel disease elsewhere in your body. Check your blood pressure regularly and keep it within normal limits. Men over age 51 or those who have a family history of high blood pressure should have it checked at least every year.   Aerobic exercise helps maintain good heart health. 30 minutes of moderate-intensity exercise is recommended. For example, a brisk walk that increases your heart rate and breathing. This walk should be done on most days of the week. Persistent high blood pressure should be treated with medicine if weight loss and exercise do not work.   For many men aged 49 and older, having a cholesterol test of the blood every 5 years is recommended. If your cholesterol is found to be borderline high, or if you have heart disease or certain other medical conditions, then you may need to have it monitored more frequently.   Avoid  smoking, drinking alcohol in excess (more than two drinks per day) or use of street drugs. Do not share needles with anyone. Ask for professional help if you need assistance or instructions on stopping the use of alcohol, cigarettes, and/or drugs.   Maintain normal blood lipids and cholesterol by minimizing your intake of saturated fat. Eat a well rounded diet otherwise, with plenty of fruit and vegetables. The [employer] encourage men to eat 5-9 servings of fruit and vegetables each day. Your caregiver can help you keep your risk of heart disease or stroke at a lower level.   Ask your caregiver if you are in need of earlier testing because of: a strong family history of heart disease, or you have signs of elevated testosterone (male sex hormone) levels. This can predispose you to earlier heart disease. Ask if you should have a stress test if your history suggests this. A stress test is a test done on a treadmill that looks for heart disease. This test can find disease prior to there being a problem.   Diabetes screening assesses your blood sugar level after a fasting once every 3 years after age 53 if previous tests were normal.   Most routine colon cancer screening begins at the age of 67. On a yearly basis, doctors may provide special easy to use take-home tests to check for hidden blood in the stool. Sigmoidoscopy or colonoscopy can detect the earliest forms of colon cancer and is life saving. These test use a small camera at  the end of a tube to directly examine the colon. Speak to your caregiver about this at age 33, when routine screening begins (and is repeated every 5 years unless early forms of pre-cancerous polyps or small growths are found).   At the age of 43 men usually start screening for prostate cancer every year. Screening may begin at a younger age for those with higher risk. Those at higher risk include African-Americans or having a family history of prostate cancer.  There are two types of tests for prostate cancer:   Prostate-specific antigen (PSA) testing. Recent studies raise questions about prostate cancer using PSA and you should discuss this with your caregiver.   Digital rectal exam (in which your doctor's lubricated and gloved finger feels for enlargement of the prostate through the anus).   Practice safe sex. Use condoms. Condoms are used for birth control and to help reduce the spread of sexually transmitted infections (or STIs). Unsafe sex is having an unprotected physical relationship with someone who is bisexual, homosexual, uses intravenous street drugs, or going with someone who has sexual relations with high-risk groups. Practicing safe sex helps you avoid getting an STI. Some of the STIs are gonorrhea (the clap), chlamydia, syphilis, trichimonas, herpes, HPV (human papiloma virus) and HIV (human immunodeficiency virus) which causes AIDS. The herpes, HIV and HPV are viral illnesses that have no cure. These can result in disability, cancer and death.   It is not safe for someone who has AIDS or is HIV positive to have unprotected sex with someone else who is positive. The reason for this is the fact that there are many different strains of HIV. If you have a strain that is readily treated with medications and then suddenly introduce a strain from a partner that has no further treatment options, you may suddenly have a strain of HIV that is untreatable. Even if you are both positive for HIV, it is still necessary to practice safe sex.   Use sunscreen with a SPF (or skin protection factor) of 15 or greater. Apply sunscreen liberally and repeatedly throughout the day. Being outside in the sun when your shadow caused by the sun is shorter than you are, means you are being exposed to sun at greater intensity. Lighter skinned people are at a greater risk of skin cancer. Don't forget to also wear sunglasses in order to protect your eyes from too much damaging  sunlight. Damaging sunlight can accelerate cataract formation.   Once a month do a whole body skin exam or review, using a mirror to look at your back. Notify your caregivers of changes in moles, especially if there are changes in shapes, colors, a size larger than a pencil eraser, an irregular border, or development of new moles.   Keep carbon monoxide and smoke detectors in your home functioning at all times. Change the batteries every 6 months or use a model that plugs into the wall.   Do a monthly exam of your testicles. Gently roll each testicle between your thumb and fingers, feeling for any abnormal lumps. The best time to do this is after a hot shower or bath when the tissues are looser. Notify your caregivers of any lumps, tenderness or changes in size or shape immediately.   Stay up to date with your tetanus shots and other required immunizations. You should have a booster for tetanus every 10 years. Be sure to get your flu shot every year, since 5%-20% of the [place] population comes down with  the flu. The flu vaccine changes each year, so being vaccinated once is not enough. Get your shot in the fall, before the flu season peaks. The table below lists important vaccines to get. Other vaccines to consider include:   Hepatitis A virus (to prevent a form of infection of the liver by a virus acquired from food), Varicella Zoster (a virus that causes shingles).   Meningococcal (against bacteria which cause a form of meningitis).   Brush your teeth twice a day with fluoride toothpaste, and floss once a day. Good oral hygiene prevents tooth decay and gum disease. The problems can be painful, unattractive, and can cause other health problems. Visit your dentist for a routine oral and dental check up and preventive care every 6-12 months.   The Body Mass Index or BMI is a way of measuring how much of your body is fat. Having a BMI above 27 increases the risk of heart disease, diabetes, hypertension,  stroke and other problems related to obesity. Your caregiver can help determine your BMI and based on it develop an exercise and dietary program to help you achieve or maintain this important measurement at a healthful level.   Wear seat belts whenever in a vehicle, whether a passenger or driver, and even for very short drives of a few minutes.   If you bicycle, wear a helmet at all times.   Below is a summary of the most important preventative healthcare services that adult males should seek on a regular basis throughout their lives:  Preventative Care for Adult Males  Preventative Service Ages 74-39 Ages 38-64 Ages 68 and over  Schedule of medical visits Every 5 years Every 5 years   Schedule of dental visits Every 6-12 months Every 6-12 months   Health risk assessment and lifestyle counseling Every 3-5 years Every 3-5 years Every 3-5 years  Blood pressure check** Every 2 years Every 2 years Every 2 years  Total cholesterol check including HDL** Every 5 years beginning at age 69 Every 5 years Every 5 years through age 81, then optional.  Flexible sigmoidoscopy or colonoscopy**  Every 5 years beginning at age 76 Every 5 years through age 67, then optional.  Prostate screening  Every year beginning at age 60 Every Year  Testicular exam Monthly Monthly Monthly  FOBT (fecal occult blood test)  Every year beginning at age 13 Every year until 76, then optional.  Skin self-exam Monthly Monthly Monthly  Tetanus-diphtheria (Td) immunization Every 10 years Every 10 years Every 10 years  Influenza immunization** Every year Every year Every year  Pneumococcal immunization** Optional Optional Every 5 years  Hepatitis B immunization** Series of 3 immunizations (if not done previously, usually given at 0, 1 to 2, and 4 to 6 months) Check with your caregiver if vaccination not previously given.  Check with your caregiver if vaccination not previously given.   **Family history and personal history of risk  and conditions may change your physician's recommendations.  Document Released: 04/15/2001 Document Re-Released: 05/14/2009 [facility] Patient Information 2011 [place], [place].   For allergies try Zyrtec daily as needed and on a very bad day may take a second dose, nasal saline as needed

## 2010-12-17 NOTE — Assessment & Plan Note (Signed)
Has been maintained on Coumadin for past 2 years s/p a right Femoral artery DVT after a long plane ride. He will continue to have his PT/INR checked by Dr Ileene Hutchinson for now.

## 2010-12-17 NOTE — Assessment & Plan Note (Signed)
Tdap given today, patient refuses flu shot. Encouraged heart healthy diet such as DASH diet

## 2011-01-21 ENCOUNTER — Ambulatory Visit: Payer: BC Managed Care – PPO | Admitting: Family Medicine

## 2011-01-28 ENCOUNTER — Ambulatory Visit: Payer: BC Managed Care – PPO | Admitting: Family Medicine

## 2011-01-28 ENCOUNTER — Other Ambulatory Visit (INDEPENDENT_AMBULATORY_CARE_PROVIDER_SITE_OTHER): Payer: BC Managed Care – PPO

## 2011-01-28 DIAGNOSIS — Z0000 Encounter for general adult medical examination without abnormal findings: Secondary | ICD-10-CM

## 2011-01-28 DIAGNOSIS — E785 Hyperlipidemia, unspecified: Secondary | ICD-10-CM

## 2011-01-28 LAB — CBC
HCT: 45.1 % (ref 39.0–52.0)
RBC: 4.81 Mil/uL (ref 4.22–5.81)
WBC: 7.1 10*3/uL (ref 4.5–10.5)

## 2011-01-28 LAB — RENAL FUNCTION PANEL
Albumin: 3.9 g/dL (ref 3.5–5.2)
BUN: 20 mg/dL (ref 6–23)
CO2: 28 mEq/L (ref 19–32)
Calcium: 9.1 mg/dL (ref 8.4–10.5)
Creatinine, Ser: 1.1 mg/dL (ref 0.4–1.5)

## 2011-01-28 LAB — HEPATIC FUNCTION PANEL
AST: 29 U/L (ref 0–37)
Albumin: 3.9 g/dL (ref 3.5–5.2)
Alkaline Phosphatase: 44 U/L (ref 39–117)
Total Bilirubin: 0.8 mg/dL (ref 0.3–1.2)

## 2011-01-30 LAB — NMR LIPOPROFILE WITH LIPIDS
Cholesterol, Total: 224 mg/dL — ABNORMAL HIGH (ref <200)
LDL (calc): 155 mg/dL — ABNORMAL HIGH (ref <100)
LDL Particle Number: 1679 nmol/L — ABNORMAL HIGH (ref <1000)
LP-IR Score: 41 (ref <=45)

## 2011-01-31 ENCOUNTER — Other Ambulatory Visit: Payer: Self-pay | Admitting: Oncology

## 2011-01-31 ENCOUNTER — Other Ambulatory Visit (HOSPITAL_BASED_OUTPATIENT_CLINIC_OR_DEPARTMENT_OTHER): Payer: BC Managed Care – PPO | Admitting: Lab

## 2011-01-31 DIAGNOSIS — Z7901 Long term (current) use of anticoagulants: Secondary | ICD-10-CM

## 2011-01-31 DIAGNOSIS — Z86718 Personal history of other venous thrombosis and embolism: Secondary | ICD-10-CM

## 2011-01-31 DIAGNOSIS — I82409 Acute embolism and thrombosis of unspecified deep veins of unspecified lower extremity: Secondary | ICD-10-CM

## 2011-01-31 LAB — PROTIME-INR
INR: 2.8 (ref 2.00–3.50)
Protime: 33.6 Seconds — ABNORMAL HIGH (ref 10.6–13.4)

## 2011-02-04 ENCOUNTER — Encounter: Payer: Self-pay | Admitting: Family Medicine

## 2011-02-04 ENCOUNTER — Ambulatory Visit (INDEPENDENT_AMBULATORY_CARE_PROVIDER_SITE_OTHER): Payer: BC Managed Care – PPO | Admitting: Family Medicine

## 2011-02-04 VITALS — BP 97/60 | HR 52 | Temp 97.7°F | Ht 71.0 in | Wt 162.0 lb

## 2011-02-04 DIAGNOSIS — I82409 Acute embolism and thrombosis of unspecified deep veins of unspecified lower extremity: Secondary | ICD-10-CM

## 2011-02-04 DIAGNOSIS — E785 Hyperlipidemia, unspecified: Secondary | ICD-10-CM

## 2011-02-04 MED ORDER — ROSUVASTATIN CALCIUM 20 MG PO TABS: 20.0000 mg | ORAL_TABLET | Freq: Every day | ORAL | Status: DC

## 2011-02-04 NOTE — Progress Notes (Signed)
Paul Ryan [id number] [birth year] 02/04/2011      Progress Note-Follow Up  Subjective  Chief Complaint  Chief Complaint  Patient presents with  . Follow-up    follow up    HPI  Patient is a 65 year old male in today for followup. Overall he reports he's doing well but he is disturbed here his cholesterols come out. He reports having his cholesterol checked before she left [place] in August and his total cholesterol was 135. Cut back on his Vytorin to half a tablet and today his numbers are up once again. He denies any recent illness, fevers, chills, chest pain, palpitations, shortness of breath, GI or GU complaints. He continues to follow with hematology for Coumadin checks and reports that his INRs have been somewhat labile but they have thus far not need to change his medications.  Past Medical History  Diagnosis Date  . DVT (deep venous thrombosis)     takes coumadin  . Shingles 1982    right abdominal wall  . Allergy     seasonal  . Hyperlipidemia   . Left hip pain 12/17/2010  . Preventative health care 12/17/2010    Past Surgical History  Procedure Date  . 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 Cancer Mother     unknown- everywhere  . Cancer Sister     breast    History   Social History  . Marital Status: Married    Spouse Name: N/A    Number of Children: N/A  . Years of Education: N/A   Occupational History  . Not on file.   Social History Main Topics  . Smoking status: Former Smoker -- 1.0 packs/day for 20 years    Types: Cigarettes    Quit date: 03/03/1980  . Smokeless tobacco: Never Used  . Alcohol Use: No  . Drug Use: No  . Sexually Active: Yes -- Male partner(s)   Other Topics Concern  . Not on file   Social History Narrative  . No narrative on file    Current Outpatient Prescriptions on File Prior to Visit  Medication Sig Dispense Refill  . Ascorbic Acid (VITAMIN C)  1000 MG tablet Take 1,000 mg by mouth daily.        [name] Kitchen b complex vitamins tablet Take 1 tablet by mouth daily.        . Cholecalciferol (VITAMIN D3 SUPER STRENGTH) 2000 UNITS TABS Take 2 tablets by mouth daily.        . Coenzyme Q10 (CO Q10) 100 MG CAPS Take 2 capsules by mouth daily.        [name] Kitchen DHEA 10 MG CAPS Take 1 capsule by mouth daily.        [name] Kitchen ezetimibe-simvastatin (VYTORIN) 10-40 MG per tablet Take 1 tablet by mouth at bedtime.        . NON FORMULARY 2 capsules by Per post-pyloric tube route daily. Neuro ps gold       . Omega-3 Fatty Acids (FISH OIL) 1200 MG CAPS Take 1 capsule by mouth 3 (three) times daily.        [name] Kitchen thyroid (ARMOUR THYROID) 90 MG tablet Take 90 mg by mouth as directed. Alternate with the 60 mg       . thyroid (ARMOUR) 60 MG tablet Take 60 mg by mouth as directed. Alternate with the 90 mg       . warfarin (COUMADIN)  2.5 MG tablet Take 2.5 mg by mouth as directed. Monday and Friday        . warfarin (COUMADIN) 5 MG tablet Take 5 mg by mouth as directed. Except Monday and Friday is 2.5 mg         No Known Allergies  Review of Systems  Review of Systems  Constitutional: Negative for fever and malaise/fatigue.  HENT: Negative for congestion.   Eyes: Negative for discharge.  Respiratory: Negative for shortness of breath.   Cardiovascular: Negative for chest pain, palpitations and leg swelling.  Gastrointestinal: Negative for nausea, abdominal pain and diarrhea.  Genitourinary: Negative for dysuria.  Musculoskeletal: Negative for falls.  Skin: Negative for rash.  Neurological: Negative for loss of consciousness and headaches.  Endo/Heme/Allergies: Negative for polydipsia.  Psychiatric/Behavioral: Negative for depression and suicidal ideas. The patient is not nervous/anxious and does not have insomnia.     Objective  BP 97/60  Pulse 52  Temp(Src) 97.7 F (36.5 C) (Oral)  Ht 5\' 11"  (1.803 m)  Wt 162 lb (73.483 kg)  BMI 22.59 kg/m2  SpO2 99%  Physical  Exam  Physical Exam  Constitutional: He is oriented to person, place, and time and well-developed, well-nourished, and in no distress. No distress.  HENT:  Head: Normocephalic and atraumatic.  Eyes: Conjunctivae are normal.  Neck: Neck supple. No thyromegaly present.  Cardiovascular: Normal rate, regular rhythm and normal heart sounds.   No murmur heard. Pulmonary/Chest: Effort normal and breath sounds normal. No respiratory distress.  Abdominal: He exhibits no distension and no mass. There is no tenderness.  Musculoskeletal: He exhibits no edema.  Neurological: He is alert and oriented to person, place, and time.  Skin: Skin is warm.  Psychiatric: Memory, affect and judgment normal.    Lab Results  Component Value Date   TSH 2.21 01/28/2011   Lab Results  Component Value Date   WBC 7.1 01/28/2011   HGB 15.1 01/28/2011   HCT 45.1 01/28/2011   MCV 93.6 01/28/2011   PLT 192.0 01/28/2011   Lab Results  Component Value Date   CREATININE 1.1 01/28/2011   BUN 20 01/28/2011   NA 141 01/28/2011   K 5.0 01/28/2011   CL 107 01/28/2011   CO2 28 01/28/2011   Lab Results  Component Value Date   ALT 39 01/28/2011   AST 29 01/28/2011   ALKPHOS 44 01/28/2011   BILITOT 0.8 01/28/2011   No results found for this basename: CHOL   No results found for this basename: HDL   Lab Results  Component Value Date   LDLCALC 155* 01/28/2011   Lab Results  Component Value Date   TRIG 59 01/28/2011   No results found for this basename: CHOLHDL     Assessment & Plan  Hyperlipidemia Patient was taking 1/2 of his Vytorin 10/40 daily. Will d/c this and he is given samples of Crestor 20mg  to try daily, avoid trans fats and continue fish oil  DVT (deep venous thrombosis) Numbers followed at [facility] no recent med changes

## 2011-02-04 NOTE — Assessment & Plan Note (Signed)
Patient was taking 1/2 of his Vytorin 10/40 daily. Will d/c this and he is given samples of Crestor 20mg  to try daily, avoid trans fats and continue fish oil

## 2011-02-04 NOTE — Patient Instructions (Signed)

## 2011-02-04 NOTE — Assessment & Plan Note (Signed)
Numbers followed at [facility] no recent med changes

## 2011-02-27 ENCOUNTER — Other Ambulatory Visit: Payer: Self-pay | Admitting: *Deleted

## 2011-02-27 DIAGNOSIS — I82409 Acute embolism and thrombosis of unspecified deep veins of unspecified lower extremity: Secondary | ICD-10-CM

## 2011-02-28 ENCOUNTER — Other Ambulatory Visit (HOSPITAL_BASED_OUTPATIENT_CLINIC_OR_DEPARTMENT_OTHER): Payer: BC Managed Care – PPO | Admitting: Lab

## 2011-02-28 ENCOUNTER — Other Ambulatory Visit: Payer: Self-pay | Admitting: Oncology

## 2011-02-28 DIAGNOSIS — Z86718 Personal history of other venous thrombosis and embolism: Secondary | ICD-10-CM

## 2011-02-28 DIAGNOSIS — I82409 Acute embolism and thrombosis of unspecified deep veins of unspecified lower extremity: Secondary | ICD-10-CM

## 2011-02-28 DIAGNOSIS — Z7901 Long term (current) use of anticoagulants: Secondary | ICD-10-CM

## 2011-02-28 LAB — PROTIME-INR
INR: 2.9 (ref 2.00–3.50)
Protime: 34.8 Seconds — ABNORMAL HIGH (ref 10.6–13.4)

## 2011-03-05 ENCOUNTER — Other Ambulatory Visit: Payer: Self-pay

## 2011-03-05 ENCOUNTER — Telehealth: Payer: Self-pay

## 2011-03-05 DIAGNOSIS — I82409 Acute embolism and thrombosis of unspecified deep veins of unspecified lower extremity: Secondary | ICD-10-CM

## 2011-03-05 NOTE — Telephone Encounter (Signed)
Pt notified by phone per Dr Cyndie Chime to continue current Coumadin dose.  Pt is taking 2.5mg  on M & Friday, and 5mg  all other days. Schedulers to contact pt for recheck appt in 1 month.  dph

## 2011-03-15 ENCOUNTER — Telehealth: Payer: Self-pay | Admitting: Oncology

## 2011-03-15 NOTE — Telephone Encounter (Signed)
S/w pt today re appts for 2/26 and 3/15. Also confirmed 1/29.

## 2011-04-01 ENCOUNTER — Other Ambulatory Visit (HOSPITAL_BASED_OUTPATIENT_CLINIC_OR_DEPARTMENT_OTHER): Payer: BC Managed Care – PPO | Admitting: Lab

## 2011-04-01 DIAGNOSIS — Z7901 Long term (current) use of anticoagulants: Secondary | ICD-10-CM

## 2011-04-01 DIAGNOSIS — Z5181 Encounter for therapeutic drug level monitoring: Secondary | ICD-10-CM

## 2011-04-01 DIAGNOSIS — I82409 Acute embolism and thrombosis of unspecified deep veins of unspecified lower extremity: Secondary | ICD-10-CM

## 2011-04-02 ENCOUNTER — Other Ambulatory Visit: Payer: Self-pay | Admitting: *Deleted

## 2011-04-02 ENCOUNTER — Telehealth: Payer: Self-pay | Admitting: *Deleted

## 2011-04-02 ENCOUNTER — Other Ambulatory Visit: Payer: BC Managed Care – PPO

## 2011-04-02 DIAGNOSIS — I82409 Acute embolism and thrombosis of unspecified deep veins of unspecified lower extremity: Secondary | ICD-10-CM

## 2011-04-02 NOTE — Telephone Encounter (Signed)
Notified pt to stay on same dose of coumadin which is 2.5mg  M & W, 5 mg other days & repeat PT/INR/CBC in 2 mo.  Orders routed to Schedulers to cancel Feb appt & add lab to next MD visit in March.

## 2011-04-03 ENCOUNTER — Telehealth: Payer: Self-pay | Admitting: Oncology

## 2011-04-03 NOTE — Telephone Encounter (Signed)
Talked to pt, gave him appt on March 2013, lab and MD

## 2011-04-29 ENCOUNTER — Other Ambulatory Visit: Payer: BC Managed Care – PPO | Admitting: Lab

## 2011-05-16 ENCOUNTER — Telehealth: Payer: Self-pay | Admitting: Oncology

## 2011-05-16 ENCOUNTER — Encounter: Payer: Self-pay | Admitting: Oncology

## 2011-05-16 ENCOUNTER — Ambulatory Visit (HOSPITAL_BASED_OUTPATIENT_CLINIC_OR_DEPARTMENT_OTHER): Payer: BC Managed Care – PPO | Admitting: Oncology

## 2011-05-16 ENCOUNTER — Other Ambulatory Visit (HOSPITAL_BASED_OUTPATIENT_CLINIC_OR_DEPARTMENT_OTHER): Payer: BC Managed Care – PPO

## 2011-05-16 VITALS — BP 116/65 | HR 55 | Temp 97.1°F | Ht 71.0 in | Wt 163.5 lb

## 2011-05-16 DIAGNOSIS — D6859 Other primary thrombophilia: Secondary | ICD-10-CM

## 2011-05-16 DIAGNOSIS — D689 Coagulation defect, unspecified: Secondary | ICD-10-CM

## 2011-05-16 DIAGNOSIS — Z86718 Personal history of other venous thrombosis and embolism: Secondary | ICD-10-CM

## 2011-05-16 DIAGNOSIS — I82409 Acute embolism and thrombosis of unspecified deep veins of unspecified lower extremity: Secondary | ICD-10-CM

## 2011-05-16 DIAGNOSIS — H1130 Conjunctival hemorrhage, unspecified eye: Secondary | ICD-10-CM

## 2011-05-16 HISTORY — DX: Other primary thrombophilia: D68.59

## 2011-05-16 HISTORY — DX: Coagulation defect, unspecified: D68.9

## 2011-05-16 LAB — CBC WITH DIFFERENTIAL/PLATELET
Basophils Absolute: 0 10*3/uL (ref 0.0–0.1)
EOS%: 2.7 % (ref 0.0–7.0)
HCT: 45.6 % (ref 38.4–49.9)
HGB: 15.8 g/dL (ref 13.0–17.1)
MCH: 30.4 pg (ref 27.2–33.4)
MCV: 87.7 fL (ref 79.3–98.0)
MONO%: 5.9 % (ref 0.0–14.0)
NEUT%: 59.7 % (ref 39.0–75.0)
lymph#: 2.1 10*3/uL (ref 0.9–3.3)

## 2011-05-16 LAB — PROTIME-INR: INR: 2.3 (ref 2.00–3.50)

## 2011-05-16 NOTE — Telephone Encounter (Signed)
LAB/MD VISIT MADE FOR 11/2011 BUT LABS ARE 5/6 AND 8/13 AS PT WILL BE [place] OF [place] .

## 2011-05-16 NOTE — Progress Notes (Signed)
Hematology and Oncology Follow Up Visit  Paul Ryan [id number] 19-Jan-1946 66 y.o. 05/16/2011 2:42 PM   Principle Diagnosis: Encounter Diagnoses  Name Primary?  . Coagulopathy Yes  . DVT, lower extremity, distal   . 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  Preliminary hypercoagulation evaluation unremarkable, except for persistent elevation of D-dimer.  He was kept on Coumadin for about 1 year due to the fact that he travels frequently to [place].  When he stopped the Coumadin,   I checked protein S and C levels.  He had reproducible decrease in functional protein S with borderline decrease in free protein S.  I elected to put him back on Coumadin.  Current dose is 2. 5 mg Monday, Wednesday, Friday  and  5  mg  on the other days of the week.  he now has a reproducibly stable INR which is 2.3 today.  He has developed problems with recurrent bilateral sub-conjunctival hemorrhages.he has seen a eye doctor and was put on restasis drops.  Other than being  on the Coumadin he has no other risk factors for bleeding.over the last 6 months his INRs have been low therapeutic so it is not clear to me that it is the Coumadin causing the bleeding.      Medications: reviewed  Allergies: No Known Allergies  Review of Systems: Constitutional:    no constitutional symptoms  Respiratory: no cough or dyspnea  Cardiovascular:   no chest pain or palpitations  Gastrointestinal: no change in bowel habit no hematochezia or melena  Genito-Urinary:  no hematuria  Musculoskeletal: no muscle or bone pain  Neurologic: no change in vision no headache  Skin: no rash or ecchymosis Remaining ROS negative.  Physical Exam: Blood pressure 116/65, pulse 55, temperature 97.1 F (36.2 C), temperature source Oral, height 5\' 11"  (1.803 m), weight 163 lb 8 oz (74.163 kg). Wt  Readings from Last 3 Encounters:  05/16/11 163 lb 8 oz (74.163 kg)  02/04/11 162 lb (73.483 kg)  12/17/10 157 lb 12.8 oz (71.578 kg)     General appearance:  well-nourished  HENNT: Left conjunctival hemorrhage  Lymph nodes: no adenopathy  Breasts: Lungs clear to auscultation resonant to percussion: Heart: No murmur regular rhythm Abdomen: soft nontender  Extremities: previous amputation and left index finger  Vascular: no cyanosis  Neurologic: no focal deficit  Skin: no rash or ecchymosis  Lab Results: Lab Results  Component Value Date   WBC 6.6 05/16/2011   HGB 15.8 05/16/2011   HCT 45.6 05/16/2011   MCV 87.7 05/16/2011   PLT 183 05/16/2011     Chemistry      Component Value Date/Time   NA 141 01/28/2011 0758   K 5.0 01/28/2011 0758   CL 107 01/28/2011 0758   CO2 28 01/28/2011 0758   BUN 20 01/28/2011 0758   CREATININE 1.1 01/28/2011 0758      Component Value Date/Time   CALCIUM 9.1 01/28/2011 0758   ALKPHOS 44 01/28/2011 0758   AST 29 01/28/2011 0758   ALT 39 01/28/2011 0758   BILITOT 0.8 01/28/2011 0758       Impression and Plan: #1. 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 #3. Unexplained recurrent subconjunctival hemorrhages. He will schedule another followup with his eye doctor. For now I will  continue the Coumadin at his current dose. Continue to monitor lab every 2 months.    CC:. Dr. Shary Decamp; Josephine Igo; Sharrell Ku   Levert Feinstein, MD 3/15/20132:42 PM

## 2011-05-22 ENCOUNTER — Other Ambulatory Visit (INDEPENDENT_AMBULATORY_CARE_PROVIDER_SITE_OTHER): Payer: BC Managed Care – PPO

## 2011-05-22 DIAGNOSIS — E782 Mixed hyperlipidemia: Secondary | ICD-10-CM

## 2011-05-22 DIAGNOSIS — E079 Disorder of thyroid, unspecified: Secondary | ICD-10-CM

## 2011-05-22 LAB — T4, FREE: Free T4: 0.74 ng/dL (ref 0.60–1.60)

## 2011-05-22 LAB — RENAL FUNCTION PANEL
BUN: 17 mg/dL (ref 6–23)
CO2: 27 mEq/L (ref 19–32)
Calcium: 8.7 mg/dL (ref 8.4–10.5)
Creatinine, Ser: 1.1 mg/dL (ref 0.4–1.5)

## 2011-05-22 LAB — CBC
HCT: 45.8 % (ref 39.0–52.0)
Hemoglobin: 15 g/dL (ref 13.0–17.0)
MCHC: 32.8 g/dL (ref 30.0–36.0)
MCV: 93.1 fl (ref 78.0–100.0)
RDW: 14.1 % (ref 11.5–14.6)

## 2011-05-22 LAB — LIPID PANEL
HDL: 48.5 mg/dL (ref >39.00)
Triglycerides: 70 mg/dL (ref 0.0–149.0)

## 2011-05-22 LAB — HEPATIC FUNCTION PANEL
Albumin: 3.9 g/dL (ref 3.5–5.2)
Bilirubin, Direct: 0.1 mg/dL (ref 0.0–0.3)
Total Protein: 7.1 g/dL (ref 6.0–8.3)

## 2011-05-22 LAB — T3, FREE: T3, Free: 2.9 pg/mL (ref 2.3–4.2)

## 2011-05-25 LAB — NMR LIPOPROFILE WITH LIPIDS
HDL-C: 49 mg/dL (ref >=40)
LDL Particle Number: 1412 nmol/L — ABNORMAL HIGH (ref <1000)
LDL Size: 21.7 nm (ref >20.5)
Small LDL Particle Number: 471 nmol/L (ref <=527)

## 2011-05-29 ENCOUNTER — Ambulatory Visit (INDEPENDENT_AMBULATORY_CARE_PROVIDER_SITE_OTHER): Payer: BC Managed Care – PPO | Admitting: Family Medicine

## 2011-05-29 ENCOUNTER — Encounter: Payer: Self-pay | Admitting: Family Medicine

## 2011-05-29 VITALS — BP 111/69 | HR 57 | Temp 98.2°F | Ht 71.0 in | Wt 163.0 lb

## 2011-05-29 DIAGNOSIS — E785 Hyperlipidemia, unspecified: Secondary | ICD-10-CM

## 2011-05-29 DIAGNOSIS — Z0000 Encounter for general adult medical examination without abnormal findings: Secondary | ICD-10-CM

## 2011-05-29 DIAGNOSIS — E039 Hypothyroidism, unspecified: Secondary | ICD-10-CM

## 2011-05-29 DIAGNOSIS — T7840XA Allergy, unspecified, initial encounter: Secondary | ICD-10-CM

## 2011-05-29 DIAGNOSIS — D6859 Other primary thrombophilia: Secondary | ICD-10-CM

## 2011-05-29 MED ORDER — THYROID 90 MG PO TABS: 90.0000 mg | ORAL_TABLET | ORAL | Status: DC

## 2011-05-29 MED ORDER — THYROID 60 MG PO TABS: 60.0000 mg | ORAL_TABLET | ORAL | Status: DC

## 2011-05-29 NOTE — Patient Instructions (Signed)

## 2011-06-03 ENCOUNTER — Telehealth: Payer: Self-pay

## 2011-06-03 DIAGNOSIS — D689 Coagulation defect, unspecified: Secondary | ICD-10-CM

## 2011-06-03 MED ORDER — ENOXAPARIN SODIUM 100 MG/ML ~~LOC~~ SOLN: 100.0000 mg | Freq: Every day | SUBCUTANEOUS | Status: DC

## 2011-06-03 NOTE — Telephone Encounter (Signed)
Received VM from pt stating that he is scheduled for a colonoscopy on 4/25.  GI is recommending pt hold his Coumadin for 7 days prior to procedure.  Pt requests Dr Patsy Lager advice.    Note to Dr Cyndie Chime. dph

## 2011-06-03 NOTE — Telephone Encounter (Signed)
Per Dr Cyndie Chime - pt notified that he should hold his Coumadin x 3 days prior to colonoscopy, then resume the night off procedure.  Pt to take Lovenox 1.5mg /kg (100mg ) daily x 3 days beginning the day after procedure.  Script to [employer] per pt request.  Pt verifies that he is educated on self injections as he has used Lovenox in the past.   Pt verbalizes understanding and appreciation.  This note, along with Dr Patsy Lager written note faxed to Dr Carlota Raspberry office. dph

## 2011-06-10 ENCOUNTER — Encounter: Payer: Self-pay | Admitting: Family Medicine

## 2011-06-10 DIAGNOSIS — E039 Hypothyroidism, unspecified: Secondary | ICD-10-CM

## 2011-06-10 HISTORY — DX: Hypothyroidism, unspecified: E03.9

## 2011-06-10 NOTE — Assessment & Plan Note (Signed)
Avoid trans fats, tolerating Crestor continue this and recheck again in 4-6 months.

## 2011-06-10 NOTE — Progress Notes (Signed)
Patient ID: Paul Ryan, male   DOB: 1945-11-14, 66 y.o.   MRN: [id number] Ashlee Bewley [id number] 09-12-1945 06/10/2011      Progress Note-Follow Up  Subjective  Chief Complaint  Chief Complaint  Patient presents with  . Follow-up    4 month follow up    HPI  Patient is a 66 year old male in today for routine followup. Overall he is doing well. He reports he has been contacted by his gastroenterologist Dr. Kinnie Scales and is due for repeat colonoscopy. Denies any GI complaints. Overall he feels well. His Coumadin therapy has been stable. He still had a recent febrile illness. He denies any headaches, chest pain or palpitations, shortness of breath, GI or GU complaints. 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 DVT (deep venous thrombosis)     takes coumadin  . Shingles 1982    right abdominal wall  . Allergy     seasonal  . Hyperlipidemia   . Left hip pain 12/17/2010  . Preventative health care 12/17/2010  . 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 Hypothyroid 06/10/2011    Past Surgical History  Procedure Date  . 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 Cancer Mother     unknown- everywhere  . Cancer Sister     breast    History   Social History  . Marital Status: Married    Spouse Name: N/A    Number of Children: N/A  . Years of Education: N/A   Occupational History  . Not on file.   Social History Main Topics  . Smoking status: Former Smoker -- 1.0 packs/day for 20 years    Types: Cigarettes    Quit date: 03/03/1980  . Smokeless tobacco: Never Used  . Alcohol Use: No  . Drug Use: No  . Sexually Active: Yes -- Male partner(s)   Other Topics Concern  . Not on file   Social History Narrative  . No narrative on file    Current Outpatient Prescriptions on File Prior to Visit  Medication Sig Dispense  Refill  . Ascorbic Acid (VITAMIN C) 1000 MG tablet Take 1,000 mg by mouth daily.        [name] Kitchen b complex vitamins tablet Take 1 tablet by mouth daily.        . Cholecalciferol (VITAMIN D3 SUPER STRENGTH) 2000 UNITS TABS Take 2 tablets by mouth daily.        . Coenzyme Q10 (CO Q10) 100 MG CAPS Take 2 capsules by mouth daily.        . cycloSPORINE (RESTASIS) 0.05 % ophthalmic emulsion Place 1 drop into both eyes 2 (two) times daily.      [name] Kitchen DHEA 10 MG CAPS Take 1 capsule by mouth daily.        . NON FORMULARY Take 2 capsules by mouth daily. Neuro ps gold      . Omega-3 Fatty Acids (FISH OIL) 1200 MG CAPS Take 1 capsule by mouth 3 (three) times daily.        . rosuvastatin (CRESTOR) 20 MG tablet Take 1 tablet (20 mg total) by mouth at bedtime.  30 tablet  3  . thyroid (ARMOUR THYROID) 90 MG tablet Take 1 tablet (90 mg total) by mouth as directed. Alternate with the 60  mg  45 tablet  3  . warfarin (COUMADIN) 2.5 MG tablet Take 2.5 mg by mouth as directed. MondayWed and Friday       . warfarin (COUMADIN) 5 MG tablet Take 5 mg by mouth as directed. Except Monday,  Wed and Friday is 2.5 mg      . enoxaparin (LOVENOX) 100 MG/ML injection Inject 1 mL (100 mg total) into the skin daily.  5 mL  0    No Known Allergies  Review of Systems  Review of Systems  Constitutional: Negative for fever and malaise/fatigue.  HENT: Negative for congestion.   Eyes: Negative for discharge.  Respiratory: Negative for shortness of breath.   Cardiovascular: Negative for chest pain, palpitations and leg swelling.  Gastrointestinal: Negative for nausea, abdominal pain and diarrhea.  Genitourinary: Negative for dysuria.  Musculoskeletal: Negative for falls.  Skin: Negative for rash.  Neurological: Negative for loss of consciousness and headaches.  Endo/Heme/Allergies: Negative for polydipsia.  Psychiatric/Behavioral: Negative for depression and suicidal ideas. The patient is not nervous/anxious and does not have  insomnia.     Objective  BP 111/69  Pulse 57  Temp(Src) 98.2 F (36.8 C) (Temporal)  Ht 5\' 11"  (1.803 m)  Wt 163 lb (73.936 kg)  BMI 22.73 kg/m2  SpO2 95%  Physical Exam  Physical Exam  Constitutional: He is oriented to person, place, and time and well-developed, well-nourished, and in no distress. No distress.  HENT:  Head: Normocephalic and atraumatic.  Eyes: Conjunctivae are normal.  Neck: Neck supple. No thyromegaly present.  Cardiovascular: Normal rate, regular rhythm and normal heart sounds.   No murmur heard. Pulmonary/Chest: Effort normal and breath sounds normal. No respiratory distress.  Abdominal: He exhibits no distension and no mass. There is no tenderness.  Musculoskeletal: He exhibits no edema.  Neurological: He is alert and oriented to person, place, and time.  Skin: Skin is warm.  Psychiatric: Memory, affect and judgment normal.    Lab Results  Component Value Date   TSH 0.15* 05/22/2011   Lab Results  Component Value Date   WBC 5.4 05/22/2011   HGB 15.0 05/22/2011   HCT 45.8 05/22/2011   MCV 93.1 05/22/2011   PLT 179.0 05/22/2011   Lab Results  Component Value Date   CREATININE 1.1 05/22/2011   BUN 17 05/22/2011   NA 137 05/22/2011   K 4.3 05/22/2011   CL 102 05/22/2011   CO2 27 05/22/2011   Lab Results  Component Value Date   ALT 25 05/22/2011   AST 21 05/22/2011   ALKPHOS 38* 05/22/2011   BILITOT 1.0 05/22/2011   Lab Results  Component Value Date   CHOL 178 05/22/2011   Lab Results  Component Value Date   HDL 48.50 05/22/2011   Lab Results  Component Value Date   LDLCALC 113* 05/22/2011   LDLCALC 116* 05/22/2011   Lab Results  Component Value Date   TRIG 57 05/22/2011   TRIG 70.0 05/22/2011   Lab Results  Component Value Date   CHOLHDL 4 05/22/2011     Assessment & Plan  Hereditary protein S deficiency Doing well continues to follow with hematology and manages his Coumadin well  Hyperlipidemia Avoid trans fats, tolerating Crestor  continue this and recheck again in 4-6 months.  Allergic state No significant flares this year, may take OTC antihistamines as neede  Hypothyroid Has been stable on Armour Thyroid daily, will continue

## 2011-06-10 NOTE — Assessment & Plan Note (Signed)
No significant flares this year, may take OTC antihistamines as neede

## 2011-06-10 NOTE — Assessment & Plan Note (Signed)
Doing well continues to follow with hematology and manages his Coumadin well

## 2011-06-10 NOTE — Assessment & Plan Note (Signed)
Has been stable on Armour Thyroid daily, will continue

## 2011-06-11 ENCOUNTER — Telehealth: Payer: Self-pay | Admitting: *Deleted

## 2011-06-11 NOTE — Telephone Encounter (Signed)
Pt called stating that his family Dr. wants to switch him from fish oil to mega red & he just wanted to check with Dr. Cyndie Chime to see if this would be a problem with his coumadin.  Discussed with [place] Pharmacist & was told that it can increase bleeding but can't find any info stating that it effects INR.  Suggested that he have his PT/INR check 1 wk after starting it just to be safe.  This message was left on pt's home vm per his instructions.  He also wanted to verify how long he was to hold his coumadin & take lovenox after colonoscopy.  Read previous note to pt re instructions per Dr. Cyndie Chime.

## 2011-06-24 ENCOUNTER — Other Ambulatory Visit: Payer: Self-pay | Admitting: Family Medicine

## 2011-06-24 DIAGNOSIS — E785 Hyperlipidemia, unspecified: Secondary | ICD-10-CM

## 2011-06-24 MED ORDER — ROSUVASTATIN CALCIUM 20 MG PO TABS: 20.0000 mg | ORAL_TABLET | Freq: Every day | ORAL | Status: DC

## 2011-06-24 NOTE — Telephone Encounter (Signed)
RX sent to pharmacy  

## 2011-06-26 LAB — HM COLONOSCOPY

## 2011-07-02 ENCOUNTER — Telehealth: Payer: Self-pay | Admitting: Family Medicine

## 2011-07-04 NOTE — Telephone Encounter (Signed)
Patient informed. 

## 2011-07-04 NOTE — Telephone Encounter (Signed)
Tell him he was testing my old biochemistry knowledge. So I double checked it. Niacinamide is basically a form of Niacin which is vitamin B3. So his supplement sounds fine but could cause the flushing we discussed. Start with 500mg  as instructed and can titrate up to a max of 2000 mg as tolerated, qhs

## 2011-07-04 NOTE — Telephone Encounter (Signed)
Please advise 

## 2011-07-07 ENCOUNTER — Other Ambulatory Visit: Payer: Self-pay

## 2011-07-07 ENCOUNTER — Other Ambulatory Visit (HOSPITAL_BASED_OUTPATIENT_CLINIC_OR_DEPARTMENT_OTHER): Payer: BC Managed Care – PPO | Admitting: Lab

## 2011-07-07 DIAGNOSIS — D6859 Other primary thrombophilia: Secondary | ICD-10-CM

## 2011-07-07 DIAGNOSIS — D689 Coagulation defect, unspecified: Secondary | ICD-10-CM

## 2011-07-07 LAB — CBC WITH DIFFERENTIAL/PLATELET
BASO%: 0.3 % (ref 0.0–2.0)
Basophils Absolute: 0 10e3/uL (ref 0.0–0.1)
EOS%: 2.9 % (ref 0.0–7.0)
Eosinophils Absolute: 0.2 10e3/uL (ref 0.0–0.5)
HCT: 43.5 % (ref 38.4–49.9)
HGB: 15 g/dL (ref 13.0–17.1)
LYMPH%: 30.2 % (ref 14.0–49.0)
MCH: 30.1 pg (ref 27.2–33.4)
MCHC: 34.5 g/dL (ref 32.0–36.0)
MCV: 87.3 fL (ref 79.3–98.0)
MONO#: 0.4 10e3/uL (ref 0.1–0.9)
MONO%: 5.7 % (ref 0.0–14.0)
NEUT#: 4 10e3/uL (ref 1.5–6.5)
NEUT%: 60.9 % (ref 39.0–75.0)
Platelets: 174 10e3/uL (ref 140–400)
RBC: 4.98 10e6/uL (ref 4.20–5.82)
RDW: 13.8 % (ref 11.0–14.6)
WBC: 6.5 10e3/uL (ref 4.0–10.3)
lymph#: 2 10e3/uL (ref 0.9–3.3)
nRBC: 0 % (ref 0–0)

## 2011-07-07 LAB — PROTIME-INR
INR: 3 (ref 2.00–3.50)
Protime: 36 s — ABNORMAL HIGH (ref 10.6–13.4)

## 2011-10-08 ENCOUNTER — Other Ambulatory Visit: Payer: Self-pay | Admitting: Family Medicine

## 2011-10-14 ENCOUNTER — Other Ambulatory Visit (HOSPITAL_BASED_OUTPATIENT_CLINIC_OR_DEPARTMENT_OTHER): Payer: BC Managed Care – PPO | Admitting: Lab

## 2011-10-14 ENCOUNTER — Encounter: Payer: Self-pay | Admitting: Family Medicine

## 2011-10-14 ENCOUNTER — Ambulatory Visit (INDEPENDENT_AMBULATORY_CARE_PROVIDER_SITE_OTHER): Payer: BC Managed Care – PPO | Admitting: Family Medicine

## 2011-10-14 ENCOUNTER — Telehealth: Payer: Self-pay | Admitting: *Deleted

## 2011-10-14 VITALS — BP 119/80 | HR 53 | Temp 97.2°F | Ht 71.0 in | Wt 155.8 lb

## 2011-10-14 DIAGNOSIS — Z5181 Encounter for therapeutic drug level monitoring: Secondary | ICD-10-CM

## 2011-10-14 DIAGNOSIS — E039 Hypothyroidism, unspecified: Secondary | ICD-10-CM

## 2011-10-14 DIAGNOSIS — D689 Coagulation defect, unspecified: Secondary | ICD-10-CM

## 2011-10-14 DIAGNOSIS — D6859 Other primary thrombophilia: Secondary | ICD-10-CM

## 2011-10-14 DIAGNOSIS — Z7901 Long term (current) use of anticoagulants: Secondary | ICD-10-CM

## 2011-10-14 DIAGNOSIS — E559 Vitamin D deficiency, unspecified: Secondary | ICD-10-CM | POA: Insufficient documentation

## 2011-10-14 DIAGNOSIS — E785 Hyperlipidemia, unspecified: Secondary | ICD-10-CM

## 2011-10-14 DIAGNOSIS — D569 Thalassemia, unspecified: Secondary | ICD-10-CM

## 2011-10-14 DIAGNOSIS — I82409 Acute embolism and thrombosis of unspecified deep veins of unspecified lower extremity: Secondary | ICD-10-CM

## 2011-10-14 DIAGNOSIS — Z0000 Encounter for general adult medical examination without abnormal findings: Secondary | ICD-10-CM

## 2011-10-14 HISTORY — DX: Vitamin D deficiency, unspecified: E55.9

## 2011-10-14 LAB — CBC WITH DIFFERENTIAL/PLATELET
Basophils Absolute: 0 10*3/uL (ref 0.0–0.1)
EOS%: 3.7 % (ref 0.0–7.0)
Eosinophils Absolute: 0.2 10*3/uL (ref 0.0–0.5)
HCT: 45.4 % (ref 38.4–49.9)
HGB: 15.8 g/dL (ref 13.0–17.1)
MCH: 30.9 pg (ref 27.2–33.4)
NEUT#: 3 10*3/uL (ref 1.5–6.5)
NEUT%: 52.2 % (ref 39.0–75.0)
RDW: 14.1 % (ref 11.0–14.6)
lymph#: 2.2 10*3/uL (ref 0.9–3.3)

## 2011-10-14 LAB — CBC
Hemoglobin: 15.9 g/dL (ref 13.0–17.0)
MCHC: 32.7 g/dL (ref 30.0–36.0)
MCV: 94.3 fl (ref 78.0–100.0)
Platelets: 178 10*3/uL (ref 150.0–400.0)
RBC: 5.16 Mil/uL (ref 4.22–5.81)

## 2011-10-14 LAB — PROTIME-INR: INR: 2.6 (ref 2.00–3.50)

## 2011-10-14 MED ORDER — WARFARIN SODIUM 2.5 MG PO TABS: 2.5000 mg | ORAL_TABLET | ORAL | Status: DC

## 2011-10-14 MED ORDER — THYROID 60 MG PO TABS: 60.0000 mg | ORAL_TABLET | ORAL | Status: DC

## 2011-10-14 MED ORDER — NIACIN ER 500 MG PO CPCR: 500.0000 mg | ORAL_CAPSULE | Freq: Every day | ORAL | Status: DC

## 2011-10-14 MED ORDER — WARFARIN SODIUM 5 MG PO TABS: 5.0000 mg | ORAL_TABLET | ORAL | Status: DC

## 2011-10-14 MED ORDER — ROSUVASTATIN CALCIUM 20 MG PO TABS: 20.0000 mg | ORAL_TABLET | Freq: Every day | ORAL | Status: DC

## 2011-10-14 MED ORDER — THYROID 90 MG PO TABS: 90.0000 mg | ORAL_TABLET | ORAL | Status: DC

## 2011-10-14 NOTE — Assessment & Plan Note (Signed)
Alternates his Armour Thyroid 60 mg dose with 90 mg dose every other day and doing well

## 2011-10-14 NOTE — Progress Notes (Signed)
Patient ID: Paul Ryan, male   DOB: [birth year], 66 y.o.   MRN: [id number] Paul Ryan [id number] Sep 08, 1945 10/14/2011      Progress Note-Follow Up  Subjective  Chief Complaint  Chief Complaint  Patient presents with  . Follow-up    4 month    HPI  Patient is a 65 year old male who is in today for followup. 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 Says he feels well. No recent illness, fevers, chills, chest pain, palpitations, shortness of breath, GI or GU complaints noted today. Continues to follow with hematology for his coagulopathy and reports his INR is within normal limits in reviewing the chart confirms this. He denies any bloody or tarry stool or any other complaints at this time.  Past Medical History  Diagnosis Date  . DVT (deep venous thrombosis)     takes coumadin  . Shingles 1982    right abdominal wall  . Allergy     seasonal  . Hyperlipidemia   . Left hip pain 12/17/2010  . Preventative health care 12/17/2010  . 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 Hypothyroid 06/10/2011  . Vitamin d deficiency 10/14/2011    Past Surgical History  Procedure Date  . Left index finger amputated 67 yrs old    4 surgeries    Family History  Problem Relation Age of Onset  . Cancer Mother     unknown- everywhere  . Cancer Sister     breast    History   Social History  . Marital Status: Married    Spouse Name: N/A    Number of Children: N/A  . Years of Education: N/A   Occupational History  . Not on file.   Social History Main Topics  . Smoking status: Former Smoker -- 1.0 packs/day for 20 years    Types: Cigarettes    Quit date: 03/03/1980  . Smokeless tobacco: Never Used  . Alcohol Use: No  . Drug Use: No  . Sexually Active: Yes -- Male partner(s)   Other Topics Concern  . Not on file   Social History Narrative  . No narrative on file    Current Outpatient  Prescriptions on File Prior to Visit  Medication Sig Dispense Refill  . Ascorbic Acid (VITAMIN C) 1000 MG tablet Take 1,000 mg by mouth daily.        [name] Kitchen b complex vitamins tablet Take 1 tablet by mouth daily.        . Cholecalciferol (VITAMIN D3 SUPER STRENGTH) 2000 UNITS TABS Take 2 tablets by mouth daily.        . Coenzyme Q10 (CO Q10) 100 MG CAPS Take 2 capsules by mouth daily.        . cycloSPORINE (RESTASIS) 0.05 % ophthalmic emulsion Place 1 drop into both eyes 2 (two) times daily.      . Omega-3 Fatty Acids (FISH OIL) 1200 MG CAPS Take 1 capsule by mouth 3 (three) times daily.        [name] Kitchen DISCONTD: CRESTOR 20 MG tablet TAKE  ONE TABLET BY MOUTH NIGHTLY AT BEDTIME  90 each  1  . DISCONTD: thyroid (ARMOUR THYROID) 90 MG tablet Take 1 tablet (90 mg total) by mouth as directed. Alternate with the 60 mg  45 tablet  3  . DISCONTD: thyroid (ARMOUR) 60 MG tablet Take 1 tablet (60 mg total) by mouth as directed. Alternate with the 90 mg  45  tablet  3  . DISCONTD: warfarin (COUMADIN) 2.5 MG tablet Take 2.5 mg by mouth as directed. MondayWed and Friday       . DISCONTD: warfarin (COUMADIN) 5 MG tablet Take 5 mg by mouth as directed. Except Monday,  Wed and Friday is 2.5 mg        No Known Allergies  Review of Systems  Review of Systems  Constitutional: Negative for fever and malaise/fatigue.  HENT: Negative for congestion.   Eyes: Negative for discharge.  Respiratory: Negative for shortness of breath.   Cardiovascular: Negative for chest pain, palpitations and leg swelling.  Gastrointestinal: Negative for nausea, abdominal pain and diarrhea.  Genitourinary: Negative for dysuria.  Musculoskeletal: Negative for falls.  Skin: Negative for rash.  Neurological: Negative for loss of consciousness and headaches.  Endo/Heme/Allergies: Negative for polydipsia.  Psychiatric/Behavioral: Negative for depression and suicidal ideas. The patient is not nervous/anxious and does not have insomnia.      Objective  BP 119/80  Pulse 53  Temp 97.2 F (36.2 C) (Temporal)  Ht 5\' 11"  (1.803 m)  Wt 155 lb 12.8 oz (70.67 kg)  BMI 21.73 kg/m2  SpO2 98%  Physical Exam  Physical Exam  Constitutional: He is oriented to person, place, and time and well-developed, well-nourished, and in no distress. No distress.  HENT:  Head: Normocephalic and atraumatic.  Eyes: Conjunctivae are normal.  Neck: Neck supple. No thyromegaly present.  Cardiovascular: Normal rate, regular rhythm and normal heart sounds.   No murmur heard. Pulmonary/Chest: Effort normal and breath sounds normal. No respiratory distress.  Abdominal: He exhibits no distension and no mass. There is no tenderness.  Musculoskeletal: He exhibits no edema.  Neurological: He is alert and oriented to person, place, and time.  Skin: Skin is warm.  Psychiatric: Memory, affect and judgment normal.    Lab Results  Component Value Date   TSH 0.15* 05/22/2011   Lab Results  Component Value Date   WBC 5.7 10/14/2011   HGB 15.8 10/14/2011   HCT 45.4 10/14/2011   MCV 88.8 10/14/2011   PLT 165 10/14/2011   Lab Results  Component Value Date   CREATININE 1.1 05/22/2011   BUN 17 05/22/2011   NA 137 05/22/2011   K 4.3 05/22/2011   CL 102 05/22/2011   CO2 27 05/22/2011   Lab Results  Component Value Date   ALT 25 05/22/2011   AST 21 05/22/2011   ALKPHOS 38* 05/22/2011   BILITOT 1.0 05/22/2011   Lab Results  Component Value Date   CHOL 178 05/22/2011   Lab Results  Component Value Date   HDL 48.50 05/22/2011   Lab Results  Component Value Date   LDLCALC 113* 05/22/2011   LDLCALC 116* 05/22/2011   Lab Results  Component Value Date   TRIG 57 05/22/2011   TRIG 70.0 05/22/2011   Lab Results  Component Value Date   CHOLHDL 4 05/22/2011     Assessment & Plan  Vitamin d deficiency He is taking supplements, has had deficiency in past, will recheck level today  Preventative health care Discussed risk benefit of PSA testing and he  will proceed with this test today. Had colonoscopy last year with GI and reports no concerns. Otherwise doing well, exercising 2 or more times a week and eating a heart healthy diet  Hypothyroid Alternates his Armour Thyroid 60 mg dose with 90 mg dose every other day and doing well  Hyperlipidemia Tolerating Niacin without flushing, check levels today, He is going  to start MegaRed caps 2 weeks prior to his next Coumadin check  Hereditary protein S deficiency Follows with Dr Cyndie Chime and his INR is wnl.

## 2011-10-14 NOTE — Assessment & Plan Note (Signed)
He is taking supplements, has had deficiency in past, will recheck level today

## 2011-10-14 NOTE — Assessment & Plan Note (Signed)
Follows with Dr Cyndie Chime and his INR is wnl.

## 2011-10-14 NOTE — Assessment & Plan Note (Signed)
Discussed risk benefit of PSA testing and he will proceed with this test today. Had colonoscopy last year with GI and reports no concerns. Otherwise doing well, exercising 2 or more times a week and eating a heart healthy diet

## 2011-10-14 NOTE — Assessment & Plan Note (Signed)
Tolerating Niacin without flushing, check levels today, He is going to start MegaRed caps 2 weeks prior to his next Coumadin check

## 2011-10-14 NOTE — Telephone Encounter (Signed)
Spoke with pt confirmed that he takes "Coumadin 2.5mg  every MWF and then 5mg  all other days."  Per Dr. Cyndie Chime, pt informed to continue same dosage.  Pt verbalized understanding.

## 2011-10-14 NOTE — Patient Instructions (Addendum)

## 2011-10-15 LAB — RENAL FUNCTION PANEL
Albumin: 4.2 g/dL (ref 3.5–5.2)
BUN: 15 mg/dL (ref 6–23)
Calcium: 9.3 mg/dL (ref 8.4–10.5)
Chloride: 105 mEq/L (ref 96–112)
Phosphorus: 2.7 mg/dL (ref 2.3–4.6)

## 2011-10-15 LAB — LIPID PANEL
Cholesterol: 214 mg/dL — ABNORMAL HIGH (ref 0–200)
HDL: 54.1 mg/dL (ref >39.00)
Total CHOL/HDL Ratio: 4
Triglycerides: 85 mg/dL (ref 0.0–149.0)
VLDL: 17 mg/dL (ref 0.0–40.0)

## 2011-10-15 LAB — T3, FREE: T3, Free: 3.9 pg/mL (ref 2.3–4.2)

## 2011-10-15 LAB — HEPATIC FUNCTION PANEL
ALT: 39 U/L (ref 0–53)
Albumin: 4.2 g/dL (ref 3.5–5.2)
Alkaline Phosphatase: 50 U/L (ref 39–117)
Total Protein: 7.5 g/dL (ref 6.0–8.3)

## 2011-10-15 NOTE — Progress Notes (Signed)
Quick Note:  Patient Informed and voiced understanding ______ 

## 2011-10-15 NOTE — Addendum Note (Signed)
Addended by: Court Joy on: 10/15/2011 12:20 PM   Modules accepted: Orders

## 2011-10-16 ENCOUNTER — Other Ambulatory Visit: Payer: Self-pay

## 2011-10-16 DIAGNOSIS — I82409 Acute embolism and thrombosis of unspecified deep veins of unspecified lower extremity: Secondary | ICD-10-CM

## 2011-10-16 MED ORDER — WARFARIN SODIUM 5 MG PO TABS: 5.0000 mg | ORAL_TABLET | ORAL | Status: DC

## 2011-10-16 NOTE — Telephone Encounter (Signed)
Pt states that [facility] didn't have his Warfarin 5 mg.  I informed patient I will resend this

## 2011-10-17 LAB — VITAMIN D 1,25 DIHYDROXY: Vitamin D2 1, 25 (OH)2: <8 pg/mL

## 2011-10-21 NOTE — Progress Notes (Signed)
Quick Note:  Patient Informed and voiced understanding ______ 

## 2011-11-11 ENCOUNTER — Ambulatory Visit (HOSPITAL_BASED_OUTPATIENT_CLINIC_OR_DEPARTMENT_OTHER): Payer: BC Managed Care – PPO

## 2011-11-11 ENCOUNTER — Other Ambulatory Visit: Payer: BC Managed Care – PPO | Admitting: Lab

## 2011-11-11 ENCOUNTER — Ambulatory Visit (HOSPITAL_BASED_OUTPATIENT_CLINIC_OR_DEPARTMENT_OTHER): Payer: BC Managed Care – PPO | Admitting: Oncology

## 2011-11-11 ENCOUNTER — Telehealth: Payer: Self-pay | Admitting: Oncology

## 2011-11-11 VITALS — BP 101/64 | HR 56 | Temp 98.1°F | Resp 20 | Ht 71.0 in | Wt 158.2 lb

## 2011-11-11 DIAGNOSIS — D689 Coagulation defect, unspecified: Secondary | ICD-10-CM

## 2011-11-11 DIAGNOSIS — I82409 Acute embolism and thrombosis of unspecified deep veins of unspecified lower extremity: Secondary | ICD-10-CM

## 2011-11-11 DIAGNOSIS — D6859 Other primary thrombophilia: Secondary | ICD-10-CM

## 2011-11-11 DIAGNOSIS — Z7901 Long term (current) use of anticoagulants: Secondary | ICD-10-CM

## 2011-11-11 LAB — PROTIME-INR: INR: 2.2 (ref 2.00–3.50)

## 2011-11-11 NOTE — Progress Notes (Signed)
Hematology and Oncology Follow Up Visit  Paul Ryan [id number] November 16, 1945 66 y.o. 11/11/2011 2:15 PM   Principle Diagnosis: Encounter Diagnoses  Name Primary?  . Coagulopathy Yes  . DVT (deep venous thrombosis)   . 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 Preliminary hypercoagulation evaluation unremarkable, except for persistent elevation of D-dimer. He was kept on Coumadin for about 1 year due to the fact that he travels frequently to [place]. When he stopped the Coumadin, I checked protein S and C levels. He had reproducible decrease in functional protein S with borderline decrease in free protein S. I elected to put him back on Coumadin. Current dose is 2. 5 mg Monday, Wednesday, Friday and 5 mg on the other days of the week. he now has a reproducibly stable INR which is 2.4 today.  He has had no interim problems. No signs of bleeding on Coumadin. He denies epistaxis, melena, hematuria. No chest pain, dyspnea, or palpitations. No problems with leg pain or swelling. Chronic mild swelling of his right calf. 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    Medications: reviewed  Allergies: No Known Allergies  Review of Systems: Constitutional:   No constitutional symptoms Respiratory: See above Cardiovascular:  See above Gastrointestinal: See above Genito-Urinary: No urinary tract symptoms Musculoskeletal: No muscle or bone pain Neurologic: No headache or change in vision Skin: No rash or ecchymosis Remaining ROS negative.  Physical Exam: Blood pressure 101/64, pulse 56, temperature 98.1 F (36.7 C), temperature source Oral, resp. rate 20, height 5\' 11"  (1.803 m), weight 158 lb 3.2 oz (71.759 kg). Wt Readings from Last 3  Encounters:  11/11/11 158 lb 3.2 oz (71.759 kg)  10/14/11 155 lb 12.8 oz (70.67 kg)  05/29/11 163 lb (73.936 kg)     General appearance: Thin man HENNT: Pharynx no erythema or exudate Lymph nodes: No adenopathy Breasts: Lungs: Clear to auscultation resonant to percussion Heart: Regular rhythm no murmur Abdomen: Soft nontender no mass no organomegaly Extremities: No edema. No calf tenderness. Mild discrepancy calf girth right versus left Vascular: No cyanosis Neurologic: Motor strength 5 over 5, reflexes 1+ symmetric Skin: No rash or ecchymosis  Lab Results: Lab Results  Component Value Date   WBC 6.1 10/14/2011   HGB 15.9 10/14/2011   HCT 48.7 10/14/2011   MCV 94.3 10/14/2011   PLT 178.0 10/14/2011     Chemistry      Component Value Date/Time   NA 142 10/14/2011 1127   K 4.1 10/14/2011 1127   CL 105 10/14/2011 1127   CO2 28 10/14/2011 1127   BUN 15 10/14/2011 1127   CREATININE 0.9 10/14/2011 1127      Component Value Date/Time   CALCIUM 9.3 10/14/2011 1127   ALKPHOS 50 10/14/2011 1127   AST 29 10/14/2011 1127   ALT 39 10/14/2011 1127   BILITOT 0.7 10/14/2011 1127       Impression and Plan: #1. 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  He remains stable on chronic Coumadin anticoagulation. Plan: Continue Coumadin. We discussed again the new oral anticoagulants. They're still not ready for prime time and the majority of patients but I anticipate  that once the appropriate antigens are developed in the next few years we will change most patients to these drugs.   CC:. Dr. Joaquin Courts   Levert Feinstein, MD 9/10/20132:15 PM

## 2011-11-11 NOTE — Telephone Encounter (Signed)
Gave pt appt for lab in January 2014 then MD and lab in March 2014

## 2011-11-12 ENCOUNTER — Telehealth: Payer: Self-pay | Admitting: *Deleted

## 2011-11-12 NOTE — Telephone Encounter (Signed)
Called patient and let him know that INR was 2.2 yesterday.  Discussed his schedule.   He needs labs Nov '13 - CBC, PT/INR                            Jan - "14 - PT/INR                             March'14 - CBC, PT/INR  MD visit.    Will discuss with Rose.

## 2012-03-01 ENCOUNTER — Other Ambulatory Visit: Payer: Self-pay | Admitting: *Deleted

## 2012-03-01 ENCOUNTER — Ambulatory Visit (HOSPITAL_BASED_OUTPATIENT_CLINIC_OR_DEPARTMENT_OTHER): Payer: BC Managed Care – PPO | Admitting: Lab

## 2012-03-01 DIAGNOSIS — I82409 Acute embolism and thrombosis of unspecified deep veins of unspecified lower extremity: Secondary | ICD-10-CM

## 2012-03-01 LAB — CBC WITH DIFFERENTIAL/PLATELET
BASO%: 0.2 % (ref 0.0–2.0)
EOS%: 1.9 % (ref 0.0–7.0)
HGB: 15.5 g/dL (ref 13.0–17.1)
MCH: 30.8 pg (ref 27.2–33.4)
MCHC: 34.4 g/dL (ref 32.0–36.0)
MONO#: 0.3 10*3/uL (ref 0.1–0.9)
RDW: 13.7 % (ref 11.0–14.6)
WBC: 5.7 10*3/uL (ref 4.0–10.3)
lymph#: 1.7 10*3/uL (ref 0.9–3.3)

## 2012-03-01 LAB — PROTIME-INR: INR: 3.4 (ref 2.00–3.50)

## 2012-03-01 NOTE — Progress Notes (Signed)
Patient called.  He will be on a 15hr. Flight to [place], early on Wed. Am.  Hx of DVT, currently on coumadin.  Right leg/hamstring has had a dull ache for last 3 days,  This is sight of previous DVT.  Pt. Wanting to check PT/INR before flight.  Pt. Next lab appt is 03/12/12 and he will still be in [place].  Will have pt. Come in this am to have lab checked before flight on Wed.

## 2012-03-02 ENCOUNTER — Telehealth: Payer: Self-pay | Admitting: *Deleted

## 2012-03-02 ENCOUNTER — Other Ambulatory Visit: Payer: Self-pay | Admitting: *Deleted

## 2012-03-02 DIAGNOSIS — I82409 Acute embolism and thrombosis of unspecified deep veins of unspecified lower extremity: Secondary | ICD-10-CM

## 2012-03-02 NOTE — Telephone Encounter (Signed)
Message copied by Sabino Snipes on Tue Mar 02, 2012  3:17 PM ------      Message from: Levert Feinstein      Created: Mon Mar 01, 2012  3:29 PM       Call pt - INR running a little high at 3.4  Stay on same dose  Repeat PT/INR in 2 weeks

## 2012-03-02 NOTE — Telephone Encounter (Signed)
Talked to patient and gave him appt for lab on 03/15/12

## 2012-03-02 NOTE — Telephone Encounter (Signed)
Pt notified to stay on same dose of coumadin & repeat PT/INR in 2 wks.  He reports that he will be back in town 03/14/11 & would like to come on mon 03/15/12 & voices understanding of instructions.

## 2012-03-12 ENCOUNTER — Other Ambulatory Visit: Payer: BC Managed Care – PPO | Admitting: Lab

## 2012-03-15 ENCOUNTER — Telehealth: Payer: Self-pay | Admitting: *Deleted

## 2012-03-15 ENCOUNTER — Other Ambulatory Visit (HOSPITAL_BASED_OUTPATIENT_CLINIC_OR_DEPARTMENT_OTHER): Payer: BC Managed Care – PPO

## 2012-03-15 DIAGNOSIS — I82409 Acute embolism and thrombosis of unspecified deep veins of unspecified lower extremity: Secondary | ICD-10-CM

## 2012-03-15 LAB — PROTIME-INR
INR: 2 (ref 2.00–3.50)
Protime: 24 Seconds — ABNORMAL HIGH (ref 10.6–13.4)

## 2012-03-15 NOTE — Telephone Encounter (Signed)
Message copied by Sabino Snipes on Mon Mar 15, 2012  5:14 PM ------      Message from: Levert Feinstein      Created: Mon Mar 15, 2012  2:12 PM       Call pt INR 2.0  OK to continue current dose coumadin; check PT when he returns from [place]

## 2012-03-15 NOTE — Telephone Encounter (Signed)
Left message at home # to call back tomorrow.

## 2012-03-16 ENCOUNTER — Telehealth: Payer: Self-pay | Admitting: *Deleted

## 2012-03-16 ENCOUNTER — Other Ambulatory Visit: Payer: Self-pay | Admitting: Family Medicine

## 2012-03-16 NOTE — Telephone Encounter (Signed)
Called patient and let him know to stay on the same dose of coumadin.  He is back from [place] and had a good trip.   Dr. Cyndie Chime wants him to stay on the same dose of coumadin and we will recheck in 2 months.  He already has an appt. For March.  He will be out of town on 3/14 for the lab appt.  Will change lab appt. To 12noon on 3/18 right before his appt. At 1230 with Dr. Cyndie Chime.  Pt. Has CBC and PT ordered so results should be readily available.

## 2012-04-09 ENCOUNTER — Other Ambulatory Visit (INDEPENDENT_AMBULATORY_CARE_PROVIDER_SITE_OTHER): Payer: BC Managed Care – PPO

## 2012-04-09 ENCOUNTER — Telehealth: Payer: Self-pay

## 2012-04-09 DIAGNOSIS — E785 Hyperlipidemia, unspecified: Secondary | ICD-10-CM

## 2012-04-09 DIAGNOSIS — E039 Hypothyroidism, unspecified: Secondary | ICD-10-CM

## 2012-04-09 LAB — LIPID PANEL
Total CHOL/HDL Ratio: 5
VLDL: 27.4 mg/dL (ref 0.0–40.0)

## 2012-04-09 LAB — CBC
Hemoglobin: 15 g/dL (ref 13.0–17.0)
Platelets: 161 10*3/uL (ref 150.0–400.0)
RDW: 14.1 % (ref 11.5–14.6)
WBC: 6.1 10*3/uL (ref 4.5–10.5)

## 2012-04-09 LAB — HEPATIC FUNCTION PANEL
ALT: 30 U/L (ref 0–53)
AST: 23 U/L (ref 0–37)
Albumin: 3.9 g/dL (ref 3.5–5.2)
Total Protein: 7.1 g/dL (ref 6.0–8.3)

## 2012-04-09 LAB — RENAL FUNCTION PANEL
Creatinine, Ser: 1.1 mg/dL (ref 0.4–1.5)
GFR: 70.83 mL/min (ref >60.00)
Glucose, Bld: 84 mg/dL (ref 70–99)
Sodium: 139 mEq/L (ref 135–145)

## 2012-04-09 LAB — TSH: TSH: 0.39 u[IU]/mL (ref 0.35–5.50)

## 2012-04-09 NOTE — Telephone Encounter (Signed)
Message copied by Court Joy on Fri Apr 09, 2012 12:17 PM ------      Message from: Danise Edge A      Created: Fri Apr 09, 2012 12:02 PM      Regarding: RE: psa       Would not run PSA, insurance will only pay once a year. It is not due til August. Please let him know      ----- Message -----         From: Court Joy, RMA         Sent: 04/09/2012  11:05 AM           To: [name], MD      Subject: [name]: psa                                                              ----- Message -----         From: [name]         Sent: 04/09/2012  10:57 AM           To: Court Joy, RMA      Subject: psa                                                      Patient had labs today and is leaving the country until [place] wants his PSA checked. It was checked in Aug 2013 and appears to be normal, do you want this added? He was informed of last last date.

## 2012-04-09 NOTE — Telephone Encounter (Signed)
Patient informed. 

## 2012-04-13 ENCOUNTER — Encounter: Payer: Self-pay | Admitting: Family Medicine

## 2012-04-13 ENCOUNTER — Ambulatory Visit (INDEPENDENT_AMBULATORY_CARE_PROVIDER_SITE_OTHER): Payer: BC Managed Care – PPO | Admitting: Family Medicine

## 2012-04-13 VITALS — BP 110/70 | HR 56 | Temp 97.7°F | Ht 71.0 in | Wt 166.0 lb

## 2012-04-13 DIAGNOSIS — E039 Hypothyroidism, unspecified: Secondary | ICD-10-CM

## 2012-04-13 DIAGNOSIS — E785 Hyperlipidemia, unspecified: Secondary | ICD-10-CM

## 2012-04-13 MED ORDER — NIACIN ER 500 MG PO CPCR: 2000.0000 mg | ORAL_CAPSULE | Freq: Every day | ORAL | Status: AC

## 2012-04-13 NOTE — Patient Instructions (Addendum)
Next visit and labs after 10/13/12, labs include psa, lipid, renal, cbc, hepatic, tsh, free t4, free t3

## 2012-04-14 ENCOUNTER — Encounter: Payer: Self-pay | Admitting: Family Medicine

## 2012-04-15 ENCOUNTER — Ambulatory Visit: Payer: BC Managed Care – PPO | Admitting: Family Medicine

## 2012-04-17 ENCOUNTER — Other Ambulatory Visit: Payer: Self-pay

## 2012-04-18 ENCOUNTER — Encounter: Payer: Self-pay | Admitting: Family Medicine

## 2012-04-18 NOTE — Assessment & Plan Note (Signed)
Well treated on Armour thyroid no changes to dosing

## 2012-04-18 NOTE — Assessment & Plan Note (Signed)
Tolerating statin, continue to avoid trans fats and restart Krill oil caps. Maintain heart healthy diet and exercise

## 2012-04-18 NOTE — Progress Notes (Signed)
Patient ID: Paul Ryan, male   DOB: 10-Dec-1945, 67 y.o.   MRN: [id number] Carzell Saldivar [id number] 10-18-1945 04/18/2012      Progress Note-Follow Up  Subjective  Chief Complaint  Chief Complaint  Patient presents with  . Follow-up    6 month    HPI  Patient is a 67 year old male who is in today for followup. Overall he is doing well. He's just returned from a trip to [place] for an art instillation. Recent illness. No fevers, chills, headache, chest pain, palpitations, shortness of breath, GI or GU concerns. He does note he struggles with dyslexia and does feel as if his dyspepsia this memory are slightly worse at this time. No other acute complaints noted  Past Medical History  Diagnosis Date  . DVT (deep venous thrombosis)     takes coumadin  . Shingles 1982    right abdominal wall  . Allergy     seasonal  . Hyperlipidemia   . Left hip pain 12/17/2010  . Preventative health care 12/17/2010  . 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 Hypothyroid 06/10/2011  . Vitamin D deficiency 10/14/2011    Past Surgical History  Procedure Laterality Date  . 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 Cancer Mother     unknown- everywhere  . Cancer Sister     breast    History   Social History  . Marital Status: Married    Spouse Name: N/A    Number of Children: N/A  . Years of Education: N/A   Occupational History  . Not on file.   Social History Main Topics  . Smoking status: Former Smoker -- 1.00 packs/day for 20 years    Types: Cigarettes    Quit date: 03/03/1980  . Smokeless tobacco: Never Used  . Alcohol Use: No  . Drug Use: No  . Sexually Active: Yes -- Male partner(s)   Other Topics Concern  . Not on file   Social History Narrative  . No narrative on file    Current Outpatient Prescriptions on File Prior to Visit  Medication Sig Dispense Refill  . Ascorbic Acid  (VITAMIN C) 1000 MG tablet Take 1,000 mg by mouth daily.        [name] Kitchen b complex vitamins tablet Take 1 tablet by mouth daily.        . Cholecalciferol (VITAMIN D3 SUPER STRENGTH) 2000 UNITS TABS Take 2 tablets by mouth daily.        . Coenzyme Q10 (CO Q10) 100 MG CAPS Take 2 capsules by mouth daily.        . CRESTOR 20 MG tablet TAKE  ONE TABLET BY MOUTH NIGHTLY AT BEDTIME  90 tablet  0  . cycloSPORINE (RESTASIS) 0.05 % ophthalmic emulsion Place 1 drop into both eyes 2 (two) times daily.      . Omega-3 Fatty Acids (FISH OIL) 1200 MG CAPS Take 1 capsule by mouth 3 (three) times daily.        [name] Kitchen thyroid (ARMOUR THYROID) 90 MG tablet Take 1 tablet (90 mg total) by mouth as directed. Alternate with the 60 mg  45 tablet  3  . thyroid (ARMOUR) 60 MG tablet Take 1 tablet (60 mg total) by mouth as directed. Alternate with the 90 mg  45 tablet  3  . warfarin (COUMADIN) 2.5 MG tablet Take 1 tablet (2.5  mg total) by mouth as directed. MondayWed and Friday  45 tablet  3  . warfarin (COUMADIN) 5 MG tablet Take 1 tablet (5 mg total) by mouth as directed. Except Monday,  Wed and Friday is 2.5 mg  45 tablet  3   No current facility-administered medications on file prior to visit.    No Known Allergies  Review of Systems  Review of Systems  Constitutional: Negative for fever and malaise/fatigue.  HENT: Negative for congestion.   Eyes: Negative for discharge.  Respiratory: Negative for shortness of breath.   Cardiovascular: Negative for chest pain, palpitations and leg swelling.  Gastrointestinal: Negative for nausea, abdominal pain and diarrhea.  Genitourinary: Negative for dysuria.  Musculoskeletal: Negative for falls.  Skin: Negative for rash.  Neurological: Negative for loss of consciousness and headaches.  Endo/Heme/Allergies: Negative for polydipsia.  Psychiatric/Behavioral: Negative for depression and suicidal ideas. The patient is not nervous/anxious and does not have insomnia.      Objective  BP 110/70  Pulse 47  Temp(Src) 97.7 F (36.5 C) (Oral)  Ht 5\' 11"  (1.803 m)  Wt 166 lb 0.6 oz (75.315 kg)  BMI 23.17 kg/m2  SpO2 97%  Physical Exam  Physical Exam  Constitutional: He is oriented to person, place, and time and well-developed, well-nourished, and in no distress. No distress.  HENT:  Head: Normocephalic and atraumatic.  Eyes: Conjunctivae are normal.  Neck: Neck supple. No thyromegaly present.  Cardiovascular: Normal rate, regular rhythm and normal heart sounds.   No murmur heard. Pulmonary/Chest: Effort normal and breath sounds normal. No respiratory distress.  Abdominal: He exhibits no distension and no mass. There is no tenderness.  Musculoskeletal: He exhibits no edema.  Neurological: He is alert and oriented to person, place, and time.  Skin: Skin is warm.  Psychiatric: Memory, affect and judgment normal.    Lab Results  Component Value Date   TSH 0.39 04/09/2012   Lab Results  Component Value Date   WBC 6.1 04/09/2012   HGB 15.0 04/09/2012   HCT 45.2 04/09/2012   MCV 91.9 04/09/2012   PLT 161.0 04/09/2012   Lab Results  Component Value Date   CREATININE 1.1 04/09/2012   BUN 17 04/09/2012   NA 139 04/09/2012   K 4.1 04/09/2012   CL 108 04/09/2012   CO2 26 04/09/2012   Lab Results  Component Value Date   ALT 30 04/09/2012   AST 23 04/09/2012   ALKPHOS 35* 04/09/2012   BILITOT 0.8 04/09/2012   Lab Results  Component Value Date   CHOL 201* 04/09/2012   Lab Results  Component Value Date   HDL 38.10* 04/09/2012   Lab Results  Component Value Date   LDLCALC 113* 05/22/2011   LDLCALC 116* 05/22/2011   Lab Results  Component Value Date   TRIG 137.0 04/09/2012   Lab Results  Component Value Date   CHOLHDL 5 04/09/2012     Assessment & Plan  Hyperlipidemia Tolerating statin, continue to avoid trans fats and restart Krill oil caps. Maintain heart healthy diet and exercise  Hypothyroid Well treated on Armour thyroid no changes to dosing

## 2012-04-19 NOTE — Telephone Encounter (Signed)
Please advise 

## 2012-04-29 ENCOUNTER — Other Ambulatory Visit: Payer: Self-pay | Admitting: Family Medicine

## 2012-04-29 ENCOUNTER — Telehealth: Payer: Self-pay | Admitting: *Deleted

## 2012-04-29 NOTE — Telephone Encounter (Signed)
I went to refill the Coumadin and this message came up:  Significance: Major  Warning: The hypoprothrombinemic effect of warfarin may be increased by thyroid.  Please advise refill?

## 2012-04-29 NOTE — Telephone Encounter (Signed)
Patient's daughter is going to have a physical and has told her doctor her Dad has blood clots.  Asked if his disease is genetic in order for his daughter to notify her PCP.  Informed Paul Ryan he has a congenital protein S deficiency.  Paul Ryan will let his daughter know.

## 2012-05-12 ENCOUNTER — Telehealth: Payer: Self-pay | Admitting: Oncology

## 2012-05-12 NOTE — Telephone Encounter (Signed)
Pt called and r/s lab from 3/14 to 3/18

## 2012-05-14 ENCOUNTER — Other Ambulatory Visit: Payer: BC Managed Care – PPO | Admitting: Lab

## 2012-05-14 ENCOUNTER — Other Ambulatory Visit: Payer: Self-pay | Admitting: Family Medicine

## 2012-05-18 ENCOUNTER — Other Ambulatory Visit (HOSPITAL_BASED_OUTPATIENT_CLINIC_OR_DEPARTMENT_OTHER): Payer: BC Managed Care – PPO | Admitting: Lab

## 2012-05-18 ENCOUNTER — Ambulatory Visit (HOSPITAL_BASED_OUTPATIENT_CLINIC_OR_DEPARTMENT_OTHER): Payer: BC Managed Care – PPO | Admitting: Oncology

## 2012-05-18 ENCOUNTER — Telehealth: Payer: Self-pay | Admitting: Oncology

## 2012-05-18 VITALS — BP 125/79 | HR 57 | Temp 97.0°F | Resp 18 | Ht 71.0 in | Wt 167.7 lb

## 2012-05-18 DIAGNOSIS — I82409 Acute embolism and thrombosis of unspecified deep veins of unspecified lower extremity: Secondary | ICD-10-CM

## 2012-05-18 DIAGNOSIS — Z7901 Long term (current) use of anticoagulants: Secondary | ICD-10-CM

## 2012-05-18 DIAGNOSIS — D689 Coagulation defect, unspecified: Secondary | ICD-10-CM

## 2012-05-18 DIAGNOSIS — I82401 Acute embolism and thrombosis of unspecified deep veins of right lower extremity: Secondary | ICD-10-CM

## 2012-05-18 DIAGNOSIS — D6859 Other primary thrombophilia: Secondary | ICD-10-CM

## 2012-05-18 LAB — PROTIME-INR
INR: 2.6 (ref 2.00–3.50)
Protime: 31.2 Seconds — ABNORMAL HIGH (ref 10.6–13.4)

## 2012-05-18 NOTE — Telephone Encounter (Signed)
Gave pt appt for lab and MD on September 2014 °

## 2012-05-19 NOTE — Progress Notes (Signed)
Hematology and Oncology Follow Up Visit  Paul Ryan [id number] 1945/11/19 67 y.o. 05/19/2012 10:50 AM   Principle Diagnosis: Encounter Diagnoses  Name Primary?  . Hereditary protein S deficiency Yes  . 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 Preliminary hypercoagulation evaluation unremarkable, except for persistent elevation of D-dimer. He was kept on Coumadin for about 1 year due to the fact that he travels frequently to [place]. When he stopped the Coumadin, I checked protein S and C levels. He had reproducible decrease in functional protein S with borderline decrease in free protein S. I elected to put him back on Coumadin. Current dose is 2. 5 mg Monday, Wednesday, Friday and 5 mg on the other days of the week. he now has a reproducibly stable INR which is 2.6 today. He'll be traveling to [place] again next month for a prolonged 4 month stay. 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  He has had no interim medical problems. He denies any dyspnea or chest pain. No palpitations. No chronic leg swelling or pain.   Medications: reviewed  Allergies: No Known Allergies  Review of Systems: Constitutional:   No constitutional symptoms Respiratory: See above Cardiovascular:  See above Gastrointestinal: No change in bowel habit Genito-Urinary: Not questioned Musculoskeletal: See above Neurologic: No headache or change in vision Skin: No rash or ecchymosis Remaining ROS negative.  Physical Exam: Blood pressure 125/79, pulse 57, temperature 97 F (36.1 C), temperature source Oral, resp. rate 18, height 5\' 11"  (1.803 m), weight 167 lb 11.2 oz (76.068 kg). Wt Readings from Last 3 Encounters:  05/18/12 167 lb 11.2 oz (76.068 kg)  04/13/12 166 lb 0.6 oz (75.315 kg)  11/11/11 158 lb 3.2  oz (71.759 kg)     General appearance: Thin Asian man HENNT: Pharynx no erythema or exudate Lymph nodes: No adenopathy Breasts: Lungs: Clear to auscultation resonant to percussion Heart: Regular rhythm no murmur Abdomen: Soft, nontender, no mass, no organomegaly Extremities: No edema, no calf tenderness Vascular: No cyanosis Neurologic: Motor strength 5 over 5, reflexes 1+ symmetric Skin: No rash or ecchymosis  Lab Results: Lab Results  Component Value Date   WBC 6.1 04/09/2012   HGB 15.0 04/09/2012   HCT 45.2 04/09/2012   MCV 91.9 04/09/2012   PLT 161.0 04/09/2012     Chemistry      Component Value Date/Time   NA 139 04/09/2012 0938   K 4.1 04/09/2012 0938   CL 108 04/09/2012 0938   CO2 26 04/09/2012 0938   BUN 17 04/09/2012 0938   CREATININE 1.1 04/09/2012 0938      Component Value Date/Time   CALCIUM 8.8 04/09/2012 0938   ALKPHOS 35* 04/09/2012 0938   AST 23 04/09/2012 0938   ALT 30 04/09/2012 0938   BILITOT 0.8 04/09/2012 0938        Impression and Plan:  #1. 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  He remains stable on chronic Coumadin anticoagulation.  Plan: Continue Coumadin.  We  again discussed the potential use of one of the new oral anticoagulants. The drugs have now been approved for 2 years. We have more data from the [facility] trial which has lead to FDA approval of Xarelto for acute  DVT and PE. Some experts still recommend caution in using these drugs in the acute setting until there is longer followup data. However, for somebody like Paul Ryan, who will be on long-term anticoagulation, I think it would be reasonable to transition him to Xarelto. I will see him again in 6 months. I do want to make any changes just before he goes out of the country.   CC:. Dr. Gwendolyn Fill, MD 3/19/201410:50 AM

## 2012-05-29 ENCOUNTER — Other Ambulatory Visit: Payer: Self-pay | Admitting: Family Medicine

## 2012-05-31 NOTE — Telephone Encounter (Signed)
Rx request to pharmacy/[facility]  

## 2012-06-01 ENCOUNTER — Other Ambulatory Visit: Payer: Self-pay | Admitting: Family Medicine

## 2012-06-12 ENCOUNTER — Other Ambulatory Visit: Payer: Self-pay | Admitting: Family Medicine

## 2012-06-14 ENCOUNTER — Other Ambulatory Visit: Payer: Self-pay | Admitting: Family Medicine

## 2012-10-06 ENCOUNTER — Other Ambulatory Visit: Payer: Self-pay

## 2012-10-06 ENCOUNTER — Telehealth: Payer: Self-pay | Admitting: Family Medicine

## 2012-10-06 DIAGNOSIS — Z0000 Encounter for general adult medical examination without abnormal findings: Secondary | ICD-10-CM

## 2012-10-06 DIAGNOSIS — E039 Hypothyroidism, unspecified: Secondary | ICD-10-CM

## 2012-10-06 DIAGNOSIS — E785 Hyperlipidemia, unspecified: Secondary | ICD-10-CM

## 2012-10-06 NOTE — Telephone Encounter (Signed)
labs include psa, lipid, renal, cbc, hepatic, tsh, free t4, free t3   Patient states that he will be going to [facility] on [address] tomorrow morning.

## 2012-10-06 NOTE — Telephone Encounter (Signed)
Lab order placed and faxed.

## 2012-10-12 ENCOUNTER — Encounter: Payer: Self-pay | Admitting: Family Medicine

## 2012-10-12 ENCOUNTER — Ambulatory Visit (INDEPENDENT_AMBULATORY_CARE_PROVIDER_SITE_OTHER): Payer: BC Managed Care – PPO | Admitting: Family Medicine

## 2012-10-12 VITALS — BP 100/60 | HR 51 | Temp 98.0°F | Ht 71.0 in | Wt 158.0 lb

## 2012-10-12 DIAGNOSIS — D6859 Other primary thrombophilia: Secondary | ICD-10-CM

## 2012-10-12 DIAGNOSIS — Z0000 Encounter for general adult medical examination without abnormal findings: Secondary | ICD-10-CM

## 2012-10-12 DIAGNOSIS — E785 Hyperlipidemia, unspecified: Secondary | ICD-10-CM

## 2012-10-12 DIAGNOSIS — E039 Hypothyroidism, unspecified: Secondary | ICD-10-CM

## 2012-10-12 MED ORDER — THYROID 60 MG PO TABS: 60.0000 mg | ORAL_TABLET | Freq: Every day | ORAL | Status: DC

## 2012-10-12 MED ORDER — WARFARIN SODIUM 2.5 MG PO TABS: 2.5000 mg | ORAL_TABLET | ORAL | Status: DC

## 2012-10-12 MED ORDER — THYROID 90 MG PO TABS: 90.0000 mg | ORAL_TABLET | Freq: Every day | ORAL | Status: DC

## 2012-10-12 MED ORDER — WARFARIN SODIUM 5 MG PO TABS: 5.0000 mg | ORAL_TABLET | ORAL | Status: DC

## 2012-10-12 MED ORDER — KRILL OIL 300 MG PO CAPS: ORAL_CAPSULE | ORAL | Status: DC

## 2012-10-12 MED ORDER — ROSUVASTATIN CALCIUM 20 MG PO TABS: 20.0000 mg | ORAL_TABLET | Freq: Every day | ORAL | Status: DC

## 2012-10-12 NOTE — Progress Notes (Signed)
Patient ID: Paul Ryan, male   DOB: 16-Aug-1945, 67 y.o.   MRN: [id number] Paul Ryan [id number] 1945-03-05 10/12/2012      Progress Note-Follow Up  Subjective  Chief Complaint  Chief Complaint  Patient presents with  . Follow-up    6 month    HPI  Patient is a 67 year old male in today for followup. He is just returned from a summer in [place] and is feeling well. Has not had any recent illness. 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 Is taking medications as prescribed. No complaints of headache, chest pain, palpitations, shortness of breath, GI or GU complaints. Has an appointment with his hematologist next month his Coumadin therapy has been stable  Past Medical History  Diagnosis Date  . DVT (deep venous thrombosis)     takes coumadin  . Shingles 1982    right abdominal wall  . Allergy     seasonal  . Hyperlipidemia   . Left hip pain 12/17/2010  . Preventative health care 12/17/2010  . 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 Hypothyroid 06/10/2011  . Vitamin D deficiency 10/14/2011    Past Surgical History  Procedure Laterality Date  . 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 Cancer Mother     unknown- everywhere  . Cancer Sister     breast    History   Social History  . Marital Status: Married    Spouse Name: N/A    Number of Children: N/A  . Years of Education: N/A   Occupational History  . Not on file.   Social History Main Topics  . Smoking status: Former Smoker -- 1.00 packs/day for 20 years    Types: Cigarettes    Quit date: 03/03/1980  . Smokeless tobacco: Never Used  . Alcohol Use: No  . Drug Use: No  . Sexually Active: Yes -- Male partner(s)   Other Topics Concern  . Not on file   Social History Narrative  . No narrative on file    Current Outpatient Prescriptions on File Prior to Visit  Medication Sig Dispense Refill  . Ascorbic Acid  (VITAMIN C) 1000 MG tablet Take 1,000 mg by mouth daily.        [name] Kitchen b complex vitamins tablet Take 1 tablet by mouth daily.        . Cholecalciferol (VITAMIN D3 SUPER STRENGTH) 2000 UNITS TABS Take 2 tablets by mouth daily.        . Coenzyme Q10 (CO Q10) 100 MG CAPS Take 2 capsules by mouth daily.        . cycloSPORINE (RESTASIS) 0.05 % ophthalmic emulsion Place 1 drop into both eyes 2 (two) times daily.      . niacin 500 MG CR capsule Take 4 capsules (2,000 mg total) by mouth at bedtime.       No current facility-administered medications on file prior to visit.    No Known Allergies  Review of Systems  Review of Systems  Constitutional: Negative for fever and malaise/fatigue.  HENT: Negative for congestion.   Eyes: Negative for pain and discharge.  Respiratory: Negative for shortness of breath.   Cardiovascular: Negative for chest pain, palpitations and leg swelling.  Gastrointestinal: Negative for nausea, abdominal pain and diarrhea.  Genitourinary: Negative for dysuria.  Musculoskeletal: Negative for falls.  Skin: Negative for rash.  Neurological: Negative for loss of  consciousness and headaches.  Endo/Heme/Allergies: Negative for polydipsia.  Psychiatric/Behavioral: Negative for depression and suicidal ideas. The patient is not nervous/anxious and does not have insomnia.     Objective  BP 100/60  Pulse 51  Temp(Src) 98 F (36.7 C) (Oral)  Ht 5\' 11"  (1.803 m)  Wt 158 lb 0.6 oz (71.686 kg)  BMI 22.05 kg/m2  SpO2 97%  Physical Exam  Physical Exam  Lab Results  Component Value Date   TSH 0.39 04/09/2012   Lab Results  Component Value Date   WBC 6.1 04/09/2012   HGB 15.0 04/09/2012   HCT 45.2 04/09/2012   MCV 91.9 04/09/2012   PLT 161.0 04/09/2012   Lab Results  Component Value Date   CREATININE 1.1 04/09/2012   BUN 17 04/09/2012   NA 139 04/09/2012   K 4.1 04/09/2012   CL 108 04/09/2012   CO2 26 04/09/2012   Lab Results  Component Value Date   ALT 30 04/09/2012   AST 23  04/09/2012   ALKPHOS 35* 04/09/2012   BILITOT 0.8 04/09/2012   Lab Results  Component Value Date   CHOL 201* 04/09/2012   Lab Results  Component Value Date   HDL 38.10* 04/09/2012   Lab Results  Component Value Date   LDLCALC 113* 05/22/2011   LDLCALC 116* 05/22/2011   Lab Results  Component Value Date   TRIG 137.0 04/09/2012   Lab Results  Component Value Date   CHOLHDL 5 04/09/2012     Assessment & Plan  Preventative health care Needs immunizations updated, have requested shot record from previous PMD, recalls pneumonia and Zostavax being done  Hyperlipidemia Slightly improved, will continue Crestor and krill oil caps daily. Avoid trans fats and maintain activity level  Hereditary protein S deficiency Is still maintained on Coumadin but they are considering switching to a different medication in fall  Hypothyroid TSH mildly suppressed but T4 normal, no changes to therapy at this time.

## 2012-10-12 NOTE — Assessment & Plan Note (Signed)
Slightly improved, will continue Crestor and krill oil caps daily. Avoid trans fats and maintain activity level

## 2012-10-12 NOTE — Assessment & Plan Note (Signed)
Is still maintained on Coumadin but they are considering switching to a different medication in fall

## 2012-10-12 NOTE — Assessment & Plan Note (Signed)
Needs immunizations updated, have requested shot record from previous PMD, recalls pneumonia and Zostavax being done

## 2012-10-12 NOTE — Patient Instructions (Addendum)

## 2012-10-12 NOTE — Assessment & Plan Note (Signed)
TSH mildly suppressed but T4 normal, no changes to therapy at this time.

## 2012-10-26 ENCOUNTER — Encounter: Payer: Self-pay | Admitting: Family Medicine

## 2012-11-02 ENCOUNTER — Telehealth: Payer: Self-pay | Admitting: Family Medicine

## 2012-11-02 NOTE — Telephone Encounter (Signed)
Received medical records from Dr. Thayer Headings

## 2012-11-16 ENCOUNTER — Other Ambulatory Visit (HOSPITAL_BASED_OUTPATIENT_CLINIC_OR_DEPARTMENT_OTHER): Payer: BC Managed Care – PPO | Admitting: Lab

## 2012-11-16 ENCOUNTER — Telehealth: Payer: Self-pay | Admitting: Oncology

## 2012-11-16 ENCOUNTER — Ambulatory Visit (HOSPITAL_BASED_OUTPATIENT_CLINIC_OR_DEPARTMENT_OTHER): Payer: BC Managed Care – PPO | Admitting: Oncology

## 2012-11-16 VITALS — BP 110/63 | HR 64 | Temp 98.3°F | Resp 20 | Ht 71.0 in | Wt 160.4 lb

## 2012-11-16 DIAGNOSIS — D6859 Other primary thrombophilia: Secondary | ICD-10-CM

## 2012-11-16 DIAGNOSIS — I82402 Acute embolism and thrombosis of unspecified deep veins of left lower extremity: Secondary | ICD-10-CM

## 2012-11-16 DIAGNOSIS — D689 Coagulation defect, unspecified: Secondary | ICD-10-CM

## 2012-11-16 DIAGNOSIS — I82409 Acute embolism and thrombosis of unspecified deep veins of unspecified lower extremity: Secondary | ICD-10-CM

## 2012-11-16 DIAGNOSIS — I82401 Acute embolism and thrombosis of unspecified deep veins of right lower extremity: Secondary | ICD-10-CM

## 2012-11-16 LAB — CBC WITH DIFFERENTIAL/PLATELET
EOS%: 2.3 % (ref 0.0–7.0)
MCH: 30.9 pg (ref 27.2–33.4)
MCV: 93.5 fL (ref 79.3–98.0)
MONO%: 4.7 % (ref 0.0–14.0)
NEUT#: 5.1 10*3/uL (ref 1.5–6.5)
RBC: 5.03 10*6/uL (ref 4.20–5.82)
RDW: 13.9 % (ref 11.0–14.6)

## 2012-11-16 LAB — COMPREHENSIVE METABOLIC PANEL (CC13)
ALT: 40 U/L (ref 0–55)
Albumin: 3.8 g/dL (ref 3.5–5.0)
CO2: 27 mEq/L (ref 22–29)
Calcium: 9.3 mg/dL (ref 8.4–10.4)
Chloride: 109 mEq/L (ref 98–109)
Glucose: 99 mg/dl (ref 70–140)
Potassium: 4.4 mEq/L (ref 3.5–5.1)
Sodium: 143 mEq/L (ref 136–145)
Total Protein: 7.8 g/dL (ref 6.4–8.3)

## 2012-11-16 NOTE — Telephone Encounter (Signed)
Gave pt appt for labs every 3 months and see MD on September 2015

## 2012-11-18 NOTE — Progress Notes (Signed)
Hematology and Oncology Follow Up Visit  Paul Ryan [id number] [birth year] 67 y.o. 11/18/2012 3:06 PM   Principle Diagnosis: Encounter Diagnoses  Name Primary?  . DVT (deep venous thrombosis), left Yes  . Coagulopathy   . 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 Preliminary hypercoagulation evaluation unremarkable, except for persistent elevation of D-dimer. He was kept on Coumadin for about 1 year due to the fact that he travels frequently to [place]. When he stopped the Coumadin, I checked protein S and C levels. He had reproducible decrease in functional protein S with borderline decrease in free protein S. I elected to put him back on Coumadin. Current dose is 2. 5 mg Monday, Wednesday, Friday and 5 mg on the other days of the week. he now has a  stable INR. He is running a little low today at INR 1.9.  He's had no subsequent problems with his leg. He denies any cough, dyspnea, chest pain, or palpitations. He just got back from a long trip to [place].  His daughters wanted be getting married soon. She is a Engineer, production who makes specialty cakes. He has put in for his retirement. He will work for another year.    Medications: reviewed  Allergies: No Known Allergies  Review of Systems: Constitutional:   No constitutional symptoms HEENT no sore throat Respiratory: See above Cardiovascular: See above  Gastrointestinal: No change in bowel habit Genito-Urinary: No urinary tract symptoms Musculoskeletal: No muscle bone or joint pain Neurologic: No headache or change in vision Skin: No rash or ecchymosis  Remaining ROS negative.     Physical Exam: Blood pressure 110/63, pulse 64, temperature 98.3 F (36.8 C), temperature source Oral, resp. rate 20, height 5\' 11"  (1.803 m), weight 160 lb 6.4 oz (72.757 kg). Wt Readings from  Last 3 Encounters:  11/16/12 160 lb 6.4 oz (72.757 kg)  10/12/12 158 lb 0.6 oz (71.686 kg)  05/18/12 167 lb 11.2 oz (76.068 kg)     General appearance: Thin Asian man who is adequately nourished HENNT: Pharynx no erythema, exudate, ulcer, or mass Lymph nodes: No cervical, supraclavicular, or axillary adenopathy Breasts: Lungs: Clear to auscultation resonant to percussion Heart: Regular rhythm no murmur Abdomen: Soft, nontender, no mass, no organomegaly Extremities: No edema, no calf tenderness Musculoskeletal: No joint deformities GU: Vascular: No carotid bruits, no cyanosis Neurologic: Mental status intact, cranial nerves grossly normal, motor strength 5 over 5, reflexes 1+ symmetric Skin: No rash or ecchymosis  Lab Results: Lab Results  Component Value Date   WBC 7.3 11/16/2012   HGB 15.6 11/16/2012   HCT 47.0 11/16/2012   MCV 93.5 11/16/2012   PLT 185 11/16/2012     Chemistry      Component Value Date/Time   NA 143 11/16/2012 1145   NA 139 04/09/2012 0938   K 4.4 11/16/2012 1145   K 4.1 04/09/2012 0938   CL 108 04/09/2012 0938   CO2 27 11/16/2012 1145   CO2 26 04/09/2012 0938   BUN 16.2 11/16/2012 1145   BUN 17 04/09/2012 0938   CREATININE 1.1 11/16/2012 1145   CREATININE 1.1 04/09/2012 0938      Component Value Date/Time   CALCIUM 9.3 11/16/2012 1145   CALCIUM 8.8 04/09/2012 0938   ALKPHOS 48 11/16/2012 1145   ALKPHOS 35* 04/09/2012 0938   AST 23 11/16/2012 1145  AST 23 04/09/2012 0938   ALT 40 11/16/2012 1145   ALT 30 04/09/2012 0938   BILITOT 0.75 11/16/2012 1145   BILITOT 0.8 04/09/2012 4098      Impression: Hematology and Oncology Follow Up Visit  Paul Ryan  [id number]  April 10, 1945 67 y.o.  05/19/2012 10:50 AM  Principle Diagnosis:  Encounter Diagnoses   Name  Primary?   .  Hereditary protein S deficiency  Yes   .  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 Preliminary hypercoagulation evaluation unremarkable, except for persistent elevation of D-dimer. He was kept on Coumadin for about 1 year due to the fact that he travels frequently to [place]. When he stopped the Coumadin, I checked protein S and C levels. He had reproducible decrease in functional protein S with borderline decrease in free protein S. I elected to put him back on Coumadin. Current dose is 2. 5 mg Monday, Wednesday, Friday and 5 mg on the other days of the week. he now has a reproducibly stable INR which is 2.6 today.  He'll be traveling to [place] again next month for a prolonged 4 month stay. 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  He has had no interim medical problems.  He denies any dyspnea or chest pain. No palpitations. No chronic leg swelling or pain.  Medications: reviewed  Allergies: No Known Allergies  Review of Systems:  Constitutional: No constitutional symptoms  Respiratory: See above  Cardiovascular: See above  Gastrointestinal: No change in bowel habit  Genito-Urinary: Not questioned  Musculoskeletal: See above  Neurologic: No headache or change in vision  Skin: No rash or ecchymosis  Remaining ROS negative.  Physical Exam:  Blood pressure 125/79, pulse 57, temperature 97 F (36.1 C), temperature source Oral, resp. rate 18, height 5\' 11"  (1.803 m), weight 167 lb 11.2 oz (76.068 kg).  Wt Readings from Last 3 Encounters:   05/18/12  167 lb 11.2 oz (76.068 kg)   04/13/12  166 lb 0.6 oz (75.315 kg)   11/11/11  158 lb 3.2 oz (71.759 kg)   General appearance: Thin Asian man  HENNT: Pharynx no erythema or exudate  Lymph nodes: No adenopathy  Breasts:  Lungs: Clear to auscultation resonant to percussion  Heart: Regular rhythm no murmur  Abdomen: Soft, nontender, no mass, no organomegaly  Extremities: No edema, no calf tenderness  Vascular: No cyanosis  Neurologic: Motor strength 5  over 5, reflexes 1+ symmetric  Skin: No rash or ecchymosis    Lab Results:  Lab Results   Component  Value  Date    WBC  6.1  04/09/2012    HGB  15.0  04/09/2012    HCT  45.2  04/09/2012    MCV  91.9  04/09/2012    PLT  161.0  04/09/2012    Chemistry       Component  Value  Date/Time  Component  Value  Date/Time    NA  139  04/09/2012 0938   CALCIUM  8.8  04/09/2012 0938    K  4.1  04/09/2012 0938   ALKPHOS  35*  04/09/2012 0938    CL  108  04/09/2012 0938   AST  23  04/09/2012 0938    CO2  26  04/09/2012 0938   ALT  30  04/09/2012 0938    BUN  17  04/09/2012 0938   BILITOT  0.8  04/09/2012 0938    CREATININE  1.1  04/09/2012 0938      Impression and Plan:   #1. 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  He remains stable on chronic Coumadin anticoagulation.   [name] Kitchen    Levert Feinstein, MD 9/18/20143:06 PM

## 2012-12-09 ENCOUNTER — Telehealth: Payer: Self-pay | Admitting: *Deleted

## 2012-12-09 NOTE — Telephone Encounter (Signed)
Received message from pt requesting script for Xarelto.  Pt states at last office visit 11/16/12 MD spoke to him about changing from Coumadin to Xarelto.  Left vm on pt cell # that MD out of office until next week and will clarify with MD and call pt back next week; call office if any questions.  Note to MD.

## 2012-12-13 ENCOUNTER — Other Ambulatory Visit: Payer: Self-pay | Admitting: *Deleted

## 2012-12-13 MED ORDER — RIVAROXABAN 20 MG PO TABS: 20.0000 mg | ORAL_TABLET | Freq: Every day | ORAL | Status: DC

## 2012-12-13 NOTE — Telephone Encounter (Signed)
Notified pt that Xarelto script sent in to pharmacy per request with instructions from MD to start 2 days after stopping Coumadin.  Pt verbalized understanding of instructions.

## 2012-12-14 ENCOUNTER — Encounter: Payer: Self-pay | Admitting: Oncology

## 2012-12-15 ENCOUNTER — Telehealth: Payer: Self-pay | Admitting: *Deleted

## 2012-12-15 NOTE — Telephone Encounter (Signed)
Left pt a message to call Triage for response to his email question for Dr Cyndie Chime

## 2013-02-15 ENCOUNTER — Other Ambulatory Visit (HOSPITAL_BASED_OUTPATIENT_CLINIC_OR_DEPARTMENT_OTHER): Payer: BC Managed Care – PPO

## 2013-02-15 DIAGNOSIS — I82409 Acute embolism and thrombosis of unspecified deep veins of unspecified lower extremity: Secondary | ICD-10-CM

## 2013-02-15 DIAGNOSIS — I82402 Acute embolism and thrombosis of unspecified deep veins of left lower extremity: Secondary | ICD-10-CM

## 2013-02-15 DIAGNOSIS — D6859 Other primary thrombophilia: Secondary | ICD-10-CM

## 2013-02-15 DIAGNOSIS — D689 Coagulation defect, unspecified: Secondary | ICD-10-CM

## 2013-02-15 LAB — PROTIME-INR
INR: 2.9 (ref 2.00–3.50)
Protime: 34.8 Seconds — ABNORMAL HIGH (ref 10.6–13.4)

## 2013-02-22 ENCOUNTER — Other Ambulatory Visit (HOSPITAL_COMMUNITY)
Admission: RE | Admit: 2013-02-22 | Discharge: 2013-02-22 | Disposition: A | Discharge status: Home / Self Care | Payer: BC Managed Care – PPO | Source: Ambulatory Visit | Attending: Family Medicine | Admitting: Family Medicine

## 2013-02-22 ENCOUNTER — Encounter: Payer: Self-pay | Admitting: Family Medicine

## 2013-02-22 ENCOUNTER — Telehealth: Payer: Self-pay | Admitting: Family Medicine

## 2013-02-22 ENCOUNTER — Ambulatory Visit (INDEPENDENT_AMBULATORY_CARE_PROVIDER_SITE_OTHER): Payer: BC Managed Care – PPO | Admitting: Family Medicine

## 2013-02-22 VITALS — BP 108/70 | HR 65 | Temp 97.8°F | Ht 71.0 in | Wt 166.1 lb

## 2013-02-22 DIAGNOSIS — R81 Glycosuria: Secondary | ICD-10-CM

## 2013-02-22 DIAGNOSIS — E039 Hypothyroidism, unspecified: Secondary | ICD-10-CM

## 2013-02-22 DIAGNOSIS — R300 Dysuria: Secondary | ICD-10-CM

## 2013-02-22 DIAGNOSIS — R369 Urethral discharge, unspecified: Secondary | ICD-10-CM

## 2013-02-22 DIAGNOSIS — E785 Hyperlipidemia, unspecified: Secondary | ICD-10-CM

## 2013-02-22 DIAGNOSIS — R350 Frequency of micturition: Secondary | ICD-10-CM

## 2013-02-22 DIAGNOSIS — Z113 Encounter for screening for infections with a predominantly sexual mode of transmission: Secondary | ICD-10-CM | POA: Insufficient documentation

## 2013-02-22 DIAGNOSIS — Z0000 Encounter for general adult medical examination without abnormal findings: Secondary | ICD-10-CM

## 2013-02-22 DIAGNOSIS — E559 Vitamin D deficiency, unspecified: Secondary | ICD-10-CM

## 2013-02-22 LAB — POCT URINALYSIS DIPSTICK
Ketones, UA: NEGATIVE
Protein, UA: NEGATIVE
Spec Grav, UA: 1.01
Urobilinogen, UA: 0.2

## 2013-02-22 MED ORDER — TACROLIMUS 0.1 % EX OINT: TOPICAL_OINTMENT | Freq: Two times a day (BID) | CUTANEOUS | Status: DC

## 2013-02-22 NOTE — Telephone Encounter (Signed)
Lab order placed.

## 2013-02-22 NOTE — Progress Notes (Signed)
Pre visit review using our clinic review tool, if applicable. No additional management support is needed unless otherwise documented below in the visit note. 

## 2013-02-22 NOTE — Telephone Encounter (Signed)
Lab order week of 05-17-2013 at [facility] prior to visit, lipid, renal, cbc, tsh, hepatic, hgba1c

## 2013-02-22 NOTE — Patient Instructions (Signed)
Candida Infection, Adult A candida infection (also called yeast, fungus and Monilia infection) is an overgrowth of yeast that can occur anywhere on the body. A yeast infection commonly occurs in warm, moist body areas. Usually, the infection remains localized but can spread to become a systemic infection. A yeast infection may be a sign of a more severe disease such as diabetes, leukemia, or AIDS. A yeast infection can occur in both men and women. In women, Candida vaginitis is a vaginal infection. It is one of the most common causes of vaginitis. Men usually do not have symptoms or know they have an infection until other problems develop. Men may find out they have a yeast infection because their sex partner has a yeast infection. Uncircumcised men are more likely to get a yeast infection than circumcised men. This is because the uncircumcised glans is not exposed to air and does not remain as dry as that of a circumcised glans. Older adults may develop yeast infections around dentures. CAUSES  Women  Antibiotics.  Steroid medication taken for a long time.  Being overweight (obese).  Diabetes.  Poor immune condition.  Certain serious medical conditions.  Immune suppressive medications for organ transplant patients.  Chemotherapy.  Pregnancy.  Menstration.  Stress and fatigue.  Intravenous drug use.  Oral contraceptives.  Wearing tight-fitting clothes in the crotch area.  Catching it from a sex partner who has a yeast infection.  Spermicide.  Intravenous, urinary, or other catheters. Men  Catching it from a sex partner who has a yeast infection.  Having oral or anal sex with a person who has the infection.  Spermicide.  Diabetes.  Antibiotics.  Poor immune system.  Medications that suppress the immune system.  Intravenous drug use.  Intravenous, urinary, or other catheters. SYMPTOMS  Women  Thick, white vaginal discharge.  Vaginal itching.  Redness and  swelling in and around the vagina.  Irritation of the lips of the vagina and perineum.  Blisters on the vaginal lips and perineum.  Painful sexual intercourse.  Low blood sugar (hypoglycemia).  Painful urination.  Bladder infections.  Intestinal problems such as constipation, indigestion, bad breath, bloating, increase in gas, diarrhea, or loose stools. Men  Men may develop intestinal problems such as constipation, indigestion, bad breath, bloating, increase in gas, diarrhea, or loose stools.  Dry, cracked skin on the penis with itching or discomfort.  Jock itch.  Dry, flaky skin.  Athlete's foot.  Hypoglycemia. DIAGNOSIS  Women  A history and an exam are performed.  The discharge may be examined under a microscope.  A culture may be taken of the discharge. Men  A history and an exam are performed.  Any discharge from the penis or areas of cracked skin will be looked at under the microscope and cultured.  Stool samples may be cultured. TREATMENT  Women  Vaginal antifungal suppositories and creams.  Medicated creams to decrease irritation and itching on the outside of the vagina.  Warm compresses to the perineal area to decrease swelling and discomfort.  Oral antifungal medications.  Medicated vaginal suppositories or cream for repeated or recurrent infections.  Wash and dry the irritation areas before applying the cream.  Eating yogurt with lactobacillus may help with prevention and treatment.  Sometimes painting the vagina with gentian violet solution may help if creams and suppositories do not work. Men  Antifungal creams and oral antifungal medications.  Sometimes treatment must continue for 30 days after the symptoms go away to prevent recurrence. HOME CARE   INSTRUCTIONS  Women  Use cotton underwear and avoid tight-fitting clothing.  Avoid colored, scented toilet paper and deodorant tampons or pads.  Do not douche.  Keep your diabetes  under control.  Finish all the prescribed medications.  Keep your skin clean and dry.  Consume milk or yogurt with lactobacillus active culture regularly. If you get frequent yeast infections and think that is what the infection is, there are over-the-counter medications that you can get. If the infection does not show healing in 3 days, talk to your caregiver.  Tell your sex partner you have a yeast infection. Your partner may need treatment also, especially if your infection does not clear up or recurs. Men  Keep your skin clean and dry.  Keep your diabetes under control.  Finish all prescribed medications.  Tell your sex partner that you have a yeast infection so they can be treated if necessary. SEEK MEDICAL CARE IF:   Your symptoms do not clear up or worsen in one week after treatment.  You have an oral temperature above 102 F (38.9 C).  You have trouble swallowing or eating for a prolonged time.  You develop blisters on and around your vagina.  You develop vaginal bleeding and it is not your menstrual period.  You develop abdominal pain.  You develop intestinal problems as mentioned above.  You get weak or lightheaded.  You have painful or increased urination.  You have pain during sexual intercourse. MAKE SURE YOU:   Understand these instructions.  Will watch your condition.  Will get help right away if you are not doing well or get worse. Document Released: 03/27/2004 Document Revised: 05/12/2011 Document Reviewed: 07/09/2009 Exit[employer] Patient Information 2014 [employer].  

## 2013-02-23 ENCOUNTER — Encounter: Payer: Self-pay | Admitting: Family Medicine

## 2013-02-27 ENCOUNTER — Encounter: Payer: Self-pay | Admitting: Family Medicine

## 2013-02-27 DIAGNOSIS — B379 Candidiasis, unspecified: Secondary | ICD-10-CM | POA: Insufficient documentation

## 2013-02-27 DIAGNOSIS — R300 Dysuria: Secondary | ICD-10-CM

## 2013-02-27 HISTORY — DX: Dysuria: R30.0

## 2013-02-27 NOTE — Progress Notes (Signed)
Patient ID: Paul Ryan, male   DOB: [birth year], 67 y.o.   MRN: [id number] Paul Ryan [id number] 1945-04-17 02/27/2013      Progress Note-Follow Up  Subjective  Chief Complaint  Chief Complaint  Patient presents with  . Urinary Tract Infection    HPI  Is a 67 year old male who is in today for dysuria. Has a history of yeast in the past. Is describing some burning and frequency for the last week. No fevers or chills. No abdominal or back pain. No malaise, myalgias, anorexia, chest pain or palpitations  Past Medical History  Diagnosis Date  . DVT (deep venous thrombosis)     takes coumadin  . Shingles 1982    right abdominal wall  . Allergy     seasonal  . Hyperlipidemia   . Left hip pain 12/17/2010  . Preventative health care 12/17/2010  . 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 Hypothyroid 06/10/2011  . Vitamin D deficiency 10/14/2011  . Dysuria 02/27/2013    Past Surgical History  Procedure Laterality Date  . 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 Cancer Mother     unknown- everywhere  . Cancer Sister     breast    History   Social History  . Marital Status: Married    Spouse Name: N/A    Number of Children: N/A  . Years of Education: N/A   Occupational History  . Not on file.   Social History Main Topics  . Smoking status: Former Smoker -- 1.00 packs/day for 20 years    Types: Cigarettes    Quit date: 03/03/1980  . Smokeless tobacco: Never Used  . Alcohol Use: No  . Drug Use: No  . Sexual Activity: Yes    Partners: Female   Other Topics Concern  . Not on file   Social History Narrative  . No narrative on file    Current Outpatient Prescriptions on File Prior to Visit  Medication Sig Dispense Refill  . Ascorbic Acid (VITAMIN C) 1000 MG tablet Take 1,000 mg by mouth daily.        [name] Kitchen b complex vitamins tablet Take 1 tablet by mouth daily.        .  Cholecalciferol (VITAMIN D3 SUPER STRENGTH) 2000 UNITS TABS Take 2 tablets by mouth daily.        . Coenzyme Q10 (CO Q10) 100 MG CAPS Take 2 capsules by mouth daily.        Boris Lown Oil 300 MG CAPS 1 cap daily      . niacin 500 MG CR capsule Take 4 capsules (2,000 mg total) by mouth at bedtime.      . Rivaroxaban (XARELTO) 20 MG TABS tablet Take 1 tablet (20 mg total) by mouth daily.  30 tablet  6  . rosuvastatin (CRESTOR) 20 MG tablet Take 1 tablet (20 mg total) by mouth daily.  90 tablet  1  . thyroid (ARMOUR THYROID) 60 MG tablet Take 1 tablet (60 mg total) by mouth daily before breakfast.  45 tablet  2  . thyroid (ARMOUR THYROID) 90 MG tablet Take 1 tablet (90 mg total) by mouth daily.  45 tablet  2  . warfarin (COUMADIN) 2.5 MG tablet Take 1 tablet (2.5 mg total) by mouth as directed. On Monday, Wed and Friday  15 tablet  6  . warfarin (COUMADIN) 5  MG tablet Take 1 tablet (5 mg total) by mouth as directed. Tues, Thurs, Sat, Sun  20 tablet  6   No current facility-administered medications on file prior to visit.    No Known Allergies  Review of Systems  Review of Systems  Constitutional: Negative for fever and malaise/fatigue.  HENT: Negative for congestion.   Eyes: Negative for discharge.  Respiratory: Negative for shortness of breath.   Cardiovascular: Negative for chest pain, palpitations and leg swelling.  Gastrointestinal: Negative for nausea, abdominal pain and diarrhea.  Genitourinary: Positive for dysuria.  Musculoskeletal: Negative for falls.  Skin: Negative for rash.  Neurological: Negative for loss of consciousness and headaches.  Endo/Heme/Allergies: Negative for polydipsia.  Psychiatric/Behavioral: Negative for depression and suicidal ideas. The patient is not nervous/anxious and does not have insomnia.     Objective  BP 108/70  Pulse 65  Temp(Src) 97.8 F (36.6 C) (Oral)  Ht 5\' 11"  (1.803 m)  Wt 166 lb 1.3 oz (75.333 kg)  BMI 23.17 kg/m2  SpO2  98%  Physical Exam  Physical Exam  Constitutional: He is oriented to person, place, and time and well-developed, well-nourished, and in no distress. No distress.  HENT:  Head: Normocephalic and atraumatic.  Eyes: Conjunctivae are normal.  Neck: Neck supple. No thyromegaly present.  Cardiovascular: Normal rate, regular rhythm and normal heart sounds.   No murmur heard. Pulmonary/Chest: Effort normal and breath sounds normal. No respiratory distress.  Abdominal: He exhibits no distension and no mass. There is no tenderness.  Musculoskeletal: He exhibits no edema.  Neurological: He is alert and oriented to person, place, and time.  Skin: Skin is warm.  Psychiatric: Memory, affect and judgment normal.    Lab Results  Component Value Date   TSH 0.39 04/09/2012   Lab Results  Component Value Date   WBC 7.3 11/16/2012   HGB 15.6 11/16/2012   HCT 47.0 11/16/2012   MCV 93.5 11/16/2012   PLT 185 11/16/2012   Lab Results  Component Value Date   CREATININE 1.1 11/16/2012   BUN 16.2 11/16/2012   NA 143 11/16/2012   K 4.4 11/16/2012   CL 108 04/09/2012   CO2 27 11/16/2012   Lab Results  Component Value Date   ALT 40 11/16/2012   AST 23 11/16/2012   ALKPHOS 48 11/16/2012   BILITOT 0.75 11/16/2012   Lab Results  Component Value Date   CHOL 201* 04/09/2012   Lab Results  Component Value Date   HDL 38.10* 04/09/2012   Lab Results  Component Value Date   LDLCALC 113* 05/22/2011   LDLCALC 116* 05/22/2011   Lab Results  Component Value Date   TRIG 137.0 04/09/2012   Lab Results  Component Value Date   CHOLHDL 5 04/09/2012     Assessment & Plan  Dysuria uncirc male has had trouble with candida in past, has responded to Tacrolimus in past. Given refill, testing negative. Increase hydration and take daily antibiotic

## 2013-02-27 NOTE — Assessment & Plan Note (Signed)
uncirc male has had trouble with candida in past, has responded to Tacrolimus in past. Given refill, testing negative. Increase hydration and take daily antibiotic

## 2013-03-01 ENCOUNTER — Encounter: Payer: Self-pay | Admitting: Family Medicine

## 2013-03-01 DIAGNOSIS — Z0000 Encounter for general adult medical examination without abnormal findings: Secondary | ICD-10-CM

## 2013-03-01 DIAGNOSIS — Z113 Encounter for screening for infections with a predominantly sexual mode of transmission: Secondary | ICD-10-CM

## 2013-03-07 NOTE — Telephone Encounter (Signed)
Urinalysis was done on 02-22-13 and came back no growth?

## 2013-03-11 ENCOUNTER — Telehealth: Payer: Self-pay

## 2013-03-11 ENCOUNTER — Other Ambulatory Visit (INDEPENDENT_AMBULATORY_CARE_PROVIDER_SITE_OTHER): Payer: BC Managed Care – PPO

## 2013-03-11 DIAGNOSIS — Z0000 Encounter for general adult medical examination without abnormal findings: Secondary | ICD-10-CM

## 2013-03-11 DIAGNOSIS — E785 Hyperlipidemia, unspecified: Secondary | ICD-10-CM

## 2013-03-11 DIAGNOSIS — E039 Hypothyroidism, unspecified: Secondary | ICD-10-CM

## 2013-03-11 LAB — RENAL FUNCTION PANEL
ALBUMIN: 3.9 g/dL (ref 3.5–5.2)
BUN: 16 mg/dL (ref 6–23)
CALCIUM: 8.9 mg/dL (ref 8.4–10.5)
CHLORIDE: 107 meq/L (ref 96–112)
CO2: 28 meq/L (ref 19–32)
CREATININE: 1.2 mg/dL (ref 0.4–1.5)
GFR: 62.68 mL/min (ref >60.00)
Glucose, Bld: 107 mg/dL — ABNORMAL HIGH (ref 70–99)
Phosphorus: 2.6 mg/dL (ref 2.3–4.6)
Potassium: 4.5 mEq/L (ref 3.5–5.1)
Sodium: 140 mEq/L (ref 135–145)

## 2013-03-11 LAB — LDL CHOLESTEROL, DIRECT: LDL DIRECT: 158 mg/dL

## 2013-03-11 LAB — HEPATIC FUNCTION PANEL
ALBUMIN: 3.9 g/dL (ref 3.5–5.2)
ALK PHOS: 44 U/L (ref 39–117)
ALT: 33 U/L (ref 0–53)
AST: 23 U/L (ref 0–37)
Bilirubin, Direct: 0.1 mg/dL (ref 0.0–0.3)
TOTAL PROTEIN: 7.3 g/dL (ref 6.0–8.3)
Total Bilirubin: 0.8 mg/dL (ref 0.3–1.2)

## 2013-03-11 LAB — LIPID PANEL
CHOL/HDL RATIO: 5
Cholesterol: 218 mg/dL — ABNORMAL HIGH (ref 0–200)
HDL: 43.6 mg/dL (ref >39.00)
TRIGLYCERIDES: 80 mg/dL (ref 0.0–149.0)
VLDL: 16 mg/dL (ref 0.0–40.0)

## 2013-03-11 LAB — CBC
HEMATOCRIT: 45.9 % (ref 39.0–52.0)
HEMOGLOBIN: 15.4 g/dL (ref 13.0–17.0)
MCHC: 33.5 g/dL (ref 30.0–36.0)
MCV: 91 fl (ref 78.0–100.0)
PLATELETS: 190 10*3/uL (ref 150.0–400.0)
RBC: 5.04 Mil/uL (ref 4.22–5.81)
RDW: 14.3 % (ref 11.5–14.6)
WBC: 6.3 10*3/uL (ref 4.5–10.5)

## 2013-03-11 LAB — TSH: TSH: 0.59 u[IU]/mL (ref 0.35–5.50)

## 2013-03-11 LAB — HEMOGLOBIN A1C: Hgb A1c MFr Bld: 6.3 % (ref 4.6–6.5)

## 2013-03-11 NOTE — Telephone Encounter (Signed)
Labs ordered.

## 2013-03-12 LAB — RPR

## 2013-03-12 LAB — HIV ANTIBODY (ROUTINE TESTING W REFLEX): HIV: NONREACTIVE

## 2013-03-14 LAB — HSV(HERPES SIMPLEX VRS) I + II AB-IGG
HSV 1 GLYCOPROTEIN G AB, IGG: 7.99 IV — AB
HSV 2 Glycoprotein G Ab, IgG: <0.1 IV

## 2013-03-17 ENCOUNTER — Encounter: Payer: Self-pay | Admitting: Family Medicine

## 2013-03-17 ENCOUNTER — Other Ambulatory Visit: Payer: Self-pay | Admitting: Family Medicine

## 2013-03-17 DIAGNOSIS — E785 Hyperlipidemia, unspecified: Secondary | ICD-10-CM

## 2013-03-17 MED ORDER — ROSUVASTATIN CALCIUM 20 MG PO TABS: 20.0000 mg | ORAL_TABLET | Freq: Every day | ORAL | Status: DC

## 2013-03-18 NOTE — Telephone Encounter (Signed)
Please advise what is supposed to be sent in? And advise other mychart message

## 2013-03-19 ENCOUNTER — Other Ambulatory Visit: Payer: Self-pay | Admitting: Family Medicine

## 2013-03-19 DIAGNOSIS — B009 Herpesviral infection, unspecified: Secondary | ICD-10-CM

## 2013-03-19 MED ORDER — ACYCLOVIR 400 MG PO TABS: 400.0000 mg | ORAL_TABLET | Freq: Three times a day (TID) | ORAL | Status: DC

## 2013-04-06 ENCOUNTER — Telehealth: Payer: Self-pay | Admitting: *Deleted

## 2013-04-06 NOTE — Telephone Encounter (Addendum)
Received vm call from pt stating that he is having some hip pain & wants to see [name] but this is not an emergency but tues & thurs are good days for him. Returned call to pt & he states his right hip is bothering him off & on & mostly at night & wakes him up.  This has been going on for @ 6 mo.  There is no heat, redness, or swelling.  He describes as a dull ache.  He has tried heat & cold but this doesn't seem to help.  He is reluctant to take anything for pain & therefore has not.  He does have a PCP, Dr. Charlett Blake.  He would like to know who [name] would recommend if he needs to see an ortho MD.   Note to [name].  Reached pt today 04/07/13 & informed per [name] that he should really see his PCP for this problem & would probably need a regular x-ray of hip then decide if he need ortho referral but if needed, Dr. Tonita Cong, Dr Alvan Dame, & Dr. Maureen Ralphs were recommended @ [facility].

## 2013-04-11 ENCOUNTER — Other Ambulatory Visit: Payer: Self-pay | Admitting: Family Medicine

## 2013-04-12 ENCOUNTER — Encounter: Payer: BC Managed Care – PPO | Admitting: Family Medicine

## 2013-04-19 ENCOUNTER — Encounter: Payer: BC Managed Care – PPO | Admitting: Family Medicine

## 2013-04-27 ENCOUNTER — Encounter: Payer: Self-pay | Admitting: Family Medicine

## 2013-04-27 ENCOUNTER — Ambulatory Visit (INDEPENDENT_AMBULATORY_CARE_PROVIDER_SITE_OTHER): Payer: BC Managed Care – PPO | Admitting: Family Medicine

## 2013-04-27 VITALS — BP 112/80 | HR 57 | Temp 98.1°F | Ht 71.0 in | Wt 166.0 lb

## 2013-04-27 DIAGNOSIS — M25559 Pain in unspecified hip: Secondary | ICD-10-CM

## 2013-04-27 DIAGNOSIS — E785 Hyperlipidemia, unspecified: Secondary | ICD-10-CM

## 2013-04-27 DIAGNOSIS — Z0000 Encounter for general adult medical examination without abnormal findings: Secondary | ICD-10-CM

## 2013-04-27 DIAGNOSIS — B379 Candidiasis, unspecified: Secondary | ICD-10-CM

## 2013-04-27 DIAGNOSIS — M25551 Pain in right hip: Secondary | ICD-10-CM

## 2013-04-27 DIAGNOSIS — E039 Hypothyroidism, unspecified: Secondary | ICD-10-CM

## 2013-04-27 DIAGNOSIS — D689 Coagulation defect, unspecified: Secondary | ICD-10-CM

## 2013-04-27 MED ORDER — RIVAROXABAN 20 MG PO TABS: 20.0000 mg | ORAL_TABLET | Freq: Every day | ORAL | Status: DC

## 2013-04-27 NOTE — Progress Notes (Signed)
Pre visit review using our clinic review tool, if applicable. No additional management support is needed unless otherwise documented below in the visit note. 

## 2013-04-27 NOTE — Patient Instructions (Signed)

## 2013-04-28 ENCOUNTER — Encounter: Payer: BC Managed Care – PPO | Admitting: Family Medicine

## 2013-05-01 ENCOUNTER — Encounter: Payer: Self-pay | Admitting: Oncology

## 2013-05-01 DIAGNOSIS — M25551 Pain in right hip: Secondary | ICD-10-CM | POA: Insufficient documentation

## 2013-05-01 NOTE — Assessment & Plan Note (Signed)
Ongoing for years but worsening and more persistent. Referred to sports medicine for further consideration.

## 2013-05-01 NOTE — Progress Notes (Signed)
Patient ID: Paul Ryan, male   DOB: 05-25-1945, 68 y.o.   MRN: [id number] Paul Ryan [id number] [birth year] 05/01/2013      Progress Note-Follow Up  Subjective  Chief Complaint  Chief Complaint  Patient presents with  . Annual Exam    physical    HPI  Patient is a 68 year old male who is in today for annual exam. He is to return well but does have a few concerns. One is of some worsening right hip pain. This had minimal trouble off and on for years over the last several months it's become more persistent. Does have some intermittent low back pain as well. Denies any incontinence or recent injury. Continues to have some mild urinary symptoms. Notes it started with a cheesy substance unit he developed some irritation intermittently. STD testing has been negative. No GI complaints. No chest pain, palpitations or shortness of breath.  Past Medical History  Diagnosis Date  . DVT (deep venous thrombosis)     takes coumadin  . Shingles 1982    right abdominal wall  . Allergy     seasonal  . Hyperlipidemia   . Left hip pain 12/17/2010  . Preventative health care 12/17/2010  . 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 Hypothyroid 06/10/2011  . Vitamin D deficiency 10/14/2011  . Dysuria 02/27/2013    Past Surgical History  Procedure Laterality Date  . 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 Cancer Mother     unknown- everywhere  . Cancer Sister     breast    History   Social History  . Marital Status: Married    Spouse Name: N/A    Number of Children: N/A  . Years of Education: N/A   Occupational History  . Not on file.   Social History Main Topics  . Smoking status: Former Smoker -- 1.00 packs/day for 20 years    Types: Cigarettes    Quit date: 03/03/1980  . Smokeless tobacco: Never Used  . Alcohol Use: No  . Drug Use: No  . Sexual Activity: Yes    Partners: Female    Other Topics Concern  . Not on file   Social History Narrative  . No narrative on file    Current Outpatient Prescriptions on File Prior to Visit  Medication Sig Dispense Refill  . ARMOUR THYROID 90 MG tablet TAKE ONE TABLET BY MOUTH ONE TIME DAILY   45 tablet  1  . Ascorbic Acid (VITAMIN C) 1000 MG tablet Take 1,000 mg by mouth daily.        [name] Kitchen b complex vitamins tablet Take 1 tablet by mouth daily.        . Cholecalciferol (VITAMIN D3 SUPER STRENGTH) 2000 UNITS TABS Take 2 tablets by mouth daily.        . Coenzyme Q10 (CO Q10) 100 MG CAPS Take 2 capsules by mouth daily.        Javier Docker Oil 300 MG CAPS 1 cap daily      . Rivaroxaban (XARELTO) 20 MG TABS tablet Take 1 tablet (20 mg total) by mouth daily.  30 tablet  6  . rosuvastatin (CRESTOR) 20 MG tablet Take 1 tablet (20 mg total) by mouth daily.  90 tablet  1  . tacrolimus (PROTOPIC) 0.1 % ointment Apply topically 2 (two) times daily.  100 g  1  .  thyroid (ARMOUR THYROID) 60 MG tablet Take 1 tablet (60 mg total) by mouth daily before breakfast.  45 tablet  2  . warfarin (COUMADIN) 2.5 MG tablet Take 1 tablet (2.5 mg total) by mouth as directed. On Monday, Wed and Friday  15 tablet  6  . warfarin (COUMADIN) 5 MG tablet Take 1 tablet (5 mg total) by mouth as directed. Tues, Thurs, Sat, Sun  20 tablet  6   No current facility-administered medications on file prior to visit.    No Known Allergies  Review of Systems  Review of Systems  Constitutional: Negative for fever, chills and malaise/fatigue.  HENT: Negative for congestion, hearing loss and nosebleeds.   Eyes: Negative for discharge.  Respiratory: Negative for cough, sputum production, shortness of breath and wheezing.   Cardiovascular: Negative for chest pain, palpitations and leg swelling.  Gastrointestinal: Negative for heartburn, nausea, vomiting, abdominal pain, diarrhea, constipation and blood in stool.  Genitourinary: Negative for dysuria, urgency, frequency and  hematuria.  Musculoskeletal: Positive for joint pain. Negative for back pain, falls and myalgias.       Right hip pain  Skin: Negative for rash.  Neurological: Negative for dizziness, tremors, sensory change, focal weakness, loss of consciousness, weakness and headaches.  Endo/Heme/Allergies: Negative for polydipsia. Does not bruise/bleed easily.  Psychiatric/Behavioral: Negative for depression and suicidal ideas. The patient is not nervous/anxious and does not have insomnia.     Objective  BP 112/80  Pulse 57  Temp(Src) 98.1 F (36.7 C) (Oral)  Ht 5\' 11"  (1.803 m)  Wt 166 lb 0.6 oz (75.315 kg)  BMI 23.17 kg/m2  SpO2 98%  Physical Exam  Physical Exam  Constitutional: He is oriented to person, place, and time and well-developed, well-nourished, and in no distress. No distress.  HENT:  Head: Normocephalic and atraumatic.  Eyes: Conjunctivae are normal.  Neck: Neck supple. No thyromegaly present.  Cardiovascular: Normal rate, regular rhythm and normal heart sounds.   No murmur heard. Pulmonary/Chest: Effort normal and breath sounds normal. No respiratory distress.  Abdominal: He exhibits no distension and no mass. There is no tenderness.  Musculoskeletal: He exhibits no edema.  Neurological: He is alert and oriented to person, place, and time.  Skin: Skin is warm.  Psychiatric: Memory, affect and judgment normal.    Lab Results  Component Value Date   TSH 0.59 03/11/2013   Lab Results  Component Value Date   WBC 6.3 03/11/2013   HGB 15.4 03/11/2013   HCT 45.9 03/11/2013   MCV 91.0 03/11/2013   PLT 190.0 03/11/2013   Lab Results  Component Value Date   CREATININE 1.2 03/11/2013   BUN 16 03/11/2013   NA 140 03/11/2013   K 4.5 03/11/2013   CL 107 03/11/2013   CO2 28 03/11/2013   Lab Results  Component Value Date   ALT 33 03/11/2013   AST 23 03/11/2013   ALKPHOS 44 03/11/2013   BILITOT 0.8 03/11/2013   Lab Results  Component Value Date   CHOL 218* 03/11/2013   Lab Results  Component  Value Date   HDL 43.60 03/11/2013   Lab Results  Component Value Date   LDLCALC 113* 05/22/2011   LDLCALC 116* 05/22/2011   Lab Results  Component Value Date   TRIG 80.0 03/11/2013   Lab Results  Component Value Date   CHOLHDL 5 03/11/2013     Assessment & Plan  Coagulopathy Patient travels extensively each year and has trouble maintaining his Coumadin, given samples of Xarelto  20 mg daily, and given an rx to start after that.   Hyperlipidemia tolerating Crestor, avoid trans fats.  Preventative health care No recent illness, depression or falls, performing ADLs well. Eats a heart healthy diet, encouraged to stay physically active and maintain adequate sleep  Candida infection Minimize simple carbs, cleanse with witch hazel and mild soap. Add a probiotic  Hip pain, right Ongoing for years but worsening and more persistent. Referred to sports medicine for further consideration.   Hypothyroid Tolerating Armour thyroid

## 2013-05-01 NOTE — Assessment & Plan Note (Signed)
Tolerating Armour thyroid 

## 2013-05-01 NOTE — Assessment & Plan Note (Signed)
Minimize simple carbs, cleanse with witch hazel and mild soap. Add a probiotic

## 2013-05-01 NOTE — Assessment & Plan Note (Signed)
tolerating Crestor, avoid trans fats.

## 2013-05-01 NOTE — Assessment & Plan Note (Addendum)
No recent illness, depression or falls, performing ADLs well. Eats a heart healthy diet, encouraged to stay physically active and maintain adequate sleep

## 2013-05-01 NOTE — Assessment & Plan Note (Signed)
Patient travels extensively each year and has trouble maintaining his Coumadin, given samples of Xarelto 20 mg daily, and given an rx to start after that.

## 2013-05-06 ENCOUNTER — Telehealth: Payer: Self-pay | Admitting: *Deleted

## 2013-05-06 NOTE — Telephone Encounter (Signed)
Pt reports that his wife has been seeing a lot on TV about law suits with xarelto & he has some questions.  He wanted to know how he knows the drug is working & what does he do if he gets in an accident. Informed that [name] has discussed this drug with him & he knows what the possible side effects are & he has a choice to take it or not.  Reiterated importance of calling if he has any signs of bleeding or bruising.  Informed that if he were in an accident & had bleeding, he would be taken care of with IVF, plasma, platelets; whatever was needed to support him.  Informed that he could always go back on the coumadin if he has concerns/fears.  Discussed with [name] & although no antidote for these drugs at this time, studies are being done & should have an antidote soon.  The major difference is that you have to do PT/INR lab with coumadin. Per [name] states that we know what the therapeutic level is but with either drug there is still a 4% chance of a bleed. This pt also wanted to know about further appts with [name].  Informed that he can go with [name] to [facility] & the phone # for contact was given to him to touch base after 06/01/13.  He expressed understanding & appreciated answers to his questions.

## 2013-05-16 ENCOUNTER — Encounter: Payer: Self-pay | Admitting: Family Medicine

## 2013-05-16 ENCOUNTER — Other Ambulatory Visit (INDEPENDENT_AMBULATORY_CARE_PROVIDER_SITE_OTHER): Payer: BC Managed Care – PPO

## 2013-05-16 ENCOUNTER — Ambulatory Visit (INDEPENDENT_AMBULATORY_CARE_PROVIDER_SITE_OTHER): Payer: BC Managed Care – PPO | Admitting: Family Medicine

## 2013-05-16 VITALS — BP 102/68 | HR 63 | Wt 164.0 lb

## 2013-05-16 DIAGNOSIS — M7061 Trochanteric bursitis, right hip: Secondary | ICD-10-CM | POA: Insufficient documentation

## 2013-05-16 DIAGNOSIS — M25559 Pain in unspecified hip: Secondary | ICD-10-CM

## 2013-05-16 DIAGNOSIS — M25551 Pain in right hip: Secondary | ICD-10-CM

## 2013-05-16 DIAGNOSIS — M76899 Other specified enthesopathies of unspecified lower limb, excluding foot: Secondary | ICD-10-CM

## 2013-05-16 NOTE — Assessment & Plan Note (Signed)
Patient had injection as described above. Patient tolerated the procedure well with near complete resolution of pain immediately. Patient is a home exercise program working on muscle imbalances and working on hip abductor strengthening. Patient will try these interventions as well as icing after activity and come back and see me again in 4 weeks for further evaluation.

## 2013-05-16 NOTE — Patient Instructions (Signed)
Good to meet you Try exercises most days of the week.  Hip abductors exercise on side, bring leg up 6 inches hold 2 seconds down slow for count of 4 seconds, repeat 30 reps 1 set daily for first week then 2 sets daily for second week, then 3 sets daily.  Look at handout as well.  Turmeric 500mg  twice daily.  Come back in 3-4 weeks if not perfect

## 2013-05-16 NOTE — Progress Notes (Signed)
[name] [address], [place] [address] Phone: [phone] Subjective:    I'm seeing this patient by the request  of:  Penni Homans, MD   CC: Right hip pain  ELF:[id number] Paul Ryan is a 68 y.o. male coming in with complaint of right hip pain. Patient states he has had this pain intermittent for years but over the course of the last year it seems to be increasing in duration. Patient states that he notices it at night and it seems to wake him up. Patient states that it is always on the right side and on the lateral posterior aspect of the hip. Patient denies any radiation down the leg or any numbness. Patient describes as more of a dull aching sensation that can be sharp and seems to get better with ambulation. Patient describes the severity of sometimes getting as bad as 7/10. Patient has not tried any significant home remedies. 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     Past medical history, social, surgical and family history all reviewed in electronic medical record.   Review of Systems: No headache, visual changes, nausea, vomiting, diarrhea, constipation, dizziness, abdominal pain, skin rash, fevers, chills, night sweats, weight loss, swollen lymph nodes, body aches, joint swelling, muscle aches, chest pain, shortness of breath, mood changes.   Objective Blood pressure 102/68, pulse 63, weight 164 lb (74.39 kg), SpO2 96.00%.  General: No apparent distress alert and oriented x3 mood and affect normal, dressed appropriately.  HEENT: Pupils equal, extraocular movements intact  Respiratory: Patient's speak in full sentences and does not appear short of breath  Cardiovascular: No lower extremity edema, non tender, no erythema  Skin: Warm dry intact with no signs of infection or rash on extremities or on axial skeleton.  Abdomen: Soft nontender  Neuro: Cranial nerves II through XII are intact,  neurovascularly intact in all extremities with 2+ DTRs and 2+ pulses.  Lymph: No lymphadenopathy of posterior or anterior cervical chain or axillae bilaterally.  Gait normal with good balance and coordination.  MSK:  Non tender with full range of motion and good stability and symmetric strength and tone of shoulders, elbows, wrist,  knee and ankles bilaterally.  Hip: Right ROM IR: 35 Deg, ER: 45 Deg, Flexion: 120 Deg, Extension: 100 Deg, Abduction: 45 Deg, Adduction: 45 Deg Strength IR: 5/5, ER: 5/5, Flexion: 5/5, Extension: 5/5, Abduction: 3/5, Adduction: 5/5 Pelvic alignment unremarkable to inspection and palpation. Standing hip rotation and gait without trendelenburg sign / unsteadiness. Greater trochanter with tenderness posterior No tenderness over piriformis . No pain with FABER or FADIR. No SI joint tenderness and normal minimal SI movement. Contralateral hip unremarkable  MSK US performed of: Right This study was ordered, performed, and interpreted by Charlann Boxer D.O.  Hip: Trochanteric bursa with swelling. Patient's posterior greater trochanteric bursa though has the significant amount of swelling as well as calcific changes. Acetabular labrum visualized and without tears, displacement, or effusion in joint. Femoral neck appears unremarkable without increased power doppler signal along Cortex.  IMPRESSION:  Calcific greater trochanteric bursitis   Procedure: Real-time Ultrasound Guided Injection of right greater trochanteric bursitis secondary to patient's body habitus Device: GE Logiq E  Ultrasound guided injection is preferred based studies that show increased duration, increased effect, greater accuracy, decreased procedural pain, increased response rate, and decreased cost with ultrasound guided versus blind injection.  Verbal informed consent obtained.  Time-out conducted.  Noted no overlying erythema, induration, or other signs of local infection.  Skin prepped in a  sterile fashion.  Local anesthesia: Topical Ethyl chloride.  With sterile technique and under real time ultrasound guidance:  Greater trochanteric area was visualized and patient's bursa was noted. A 22-gauge 3 inch needle was inserted and 4 cc of 0.5% Marcaine and 1 cc of Kenalog 40 mg/dL was injected. Pictures taken Completed without difficulty  Pain immediately resolved suggesting accurate placement of the medication.  Advised to call if fevers/chills, erythema, induration, drainage, or persistent bleeding.  Images permanently stored and available for review in the ultrasound unit.  Impression: Technically successful ultrasound guided injection.    Impression and Recommendations:     This case required medical decision making of moderate complexity.

## 2013-05-17 ENCOUNTER — Other Ambulatory Visit (HOSPITAL_BASED_OUTPATIENT_CLINIC_OR_DEPARTMENT_OTHER): Payer: BC Managed Care – PPO

## 2013-05-17 DIAGNOSIS — D689 Coagulation defect, unspecified: Secondary | ICD-10-CM

## 2013-05-17 DIAGNOSIS — D6859 Other primary thrombophilia: Secondary | ICD-10-CM

## 2013-05-17 DIAGNOSIS — I82402 Acute embolism and thrombosis of unspecified deep veins of left lower extremity: Secondary | ICD-10-CM

## 2013-05-17 LAB — PROTIME-INR
INR: 1.3 — AB (ref 2.00–3.50)
Protime: 15.6 Seconds — ABNORMAL HIGH (ref 10.6–13.4)

## 2013-05-17 LAB — CBC WITH DIFFERENTIAL/PLATELET
BASO%: 0.1 % (ref 0.0–2.0)
BASOS ABS: 0 10*3/uL (ref 0.0–0.1)
EOS%: 0.1 % (ref 0.0–7.0)
Eosinophils Absolute: 0 10*3/uL (ref 0.0–0.5)
HEMATOCRIT: 44.6 % (ref 38.4–49.9)
HGB: 15.2 g/dL (ref 13.0–17.1)
LYMPH%: 16.3 % (ref 14.0–49.0)
MCH: 30.5 pg (ref 27.2–33.4)
MCHC: 34.1 g/dL (ref 32.0–36.0)
MCV: 89.4 fL (ref 79.3–98.0)
MONO#: 0.4 10*3/uL (ref 0.1–0.9)
MONO%: 3.4 % (ref 0.0–14.0)
NEUT#: 8.4 10*3/uL — ABNORMAL HIGH (ref 1.5–6.5)
NEUT%: 80.1 % — AB (ref 39.0–75.0)
Platelets: 181 10*3/uL (ref 140–400)
RBC: 4.99 10*6/uL (ref 4.20–5.82)
RDW: 13.7 % (ref 11.0–14.6)
WBC: 10.5 10*3/uL — ABNORMAL HIGH (ref 4.0–10.3)
lymph#: 1.7 10*3/uL (ref 0.9–3.3)
nRBC: 0 % (ref 0–0)

## 2013-05-18 ENCOUNTER — Other Ambulatory Visit: Payer: Self-pay | Admitting: Oncology

## 2013-05-20 ENCOUNTER — Other Ambulatory Visit (INDEPENDENT_AMBULATORY_CARE_PROVIDER_SITE_OTHER): Payer: BC Managed Care – PPO

## 2013-05-20 ENCOUNTER — Telehealth: Payer: Self-pay

## 2013-05-20 DIAGNOSIS — D6859 Other primary thrombophilia: Secondary | ICD-10-CM

## 2013-05-20 DIAGNOSIS — D689 Coagulation defect, unspecified: Secondary | ICD-10-CM

## 2013-05-20 DIAGNOSIS — I82402 Acute embolism and thrombosis of unspecified deep veins of left lower extremity: Secondary | ICD-10-CM

## 2013-05-20 LAB — CBC WITH DIFFERENTIAL/PLATELET
BASOS PCT: 0.2 % (ref 0.0–3.0)
Basophils Absolute: 0 10*3/uL (ref 0.0–0.1)
Eosinophils Absolute: 0 10*3/uL (ref 0.0–0.7)
Eosinophils Relative: 0 % (ref 0.0–5.0)
HCT: 47.8 % (ref 39.0–52.0)
HEMOGLOBIN: 15.9 g/dL (ref 13.0–17.0)
LYMPHS PCT: 16.8 % (ref 12.0–46.0)
Lymphs Abs: 2.1 10*3/uL (ref 0.7–4.0)
MCHC: 33.2 g/dL (ref 30.0–36.0)
MCV: 92.3 fl (ref 78.0–100.0)
Monocytes Absolute: 0.5 10*3/uL (ref 0.1–1.0)
Monocytes Relative: 4.1 % (ref 3.0–12.0)
Neutro Abs: 9.7 10*3/uL — ABNORMAL HIGH (ref 1.4–7.7)
Neutrophils Relative %: 78.9 % — ABNORMAL HIGH (ref 43.0–77.0)
Platelets: 223 10*3/uL (ref 150.0–400.0)
RBC: 5.18 Mil/uL (ref 4.22–5.81)
RDW: 14.6 % (ref 11.5–14.6)
WBC: 12.3 10*3/uL — ABNORMAL HIGH (ref 4.5–10.5)

## 2013-05-20 NOTE — Telephone Encounter (Signed)
Lab needed to be ordered under [place] [facility]

## 2013-05-24 ENCOUNTER — Telehealth: Payer: Self-pay | Admitting: *Deleted

## 2013-05-24 ENCOUNTER — Ambulatory Visit (INDEPENDENT_AMBULATORY_CARE_PROVIDER_SITE_OTHER): Payer: BC Managed Care – PPO | Admitting: Family Medicine

## 2013-05-24 ENCOUNTER — Encounter: Payer: Self-pay | Admitting: Family Medicine

## 2013-05-24 VITALS — BP 108/68 | HR 51 | Temp 98.5°F | Ht 71.0 in | Wt 163.0 lb

## 2013-05-24 DIAGNOSIS — B379 Candidiasis, unspecified: Secondary | ICD-10-CM

## 2013-05-24 DIAGNOSIS — M76899 Other specified enthesopathies of unspecified lower limb, excluding foot: Secondary | ICD-10-CM

## 2013-05-24 DIAGNOSIS — M7061 Trochanteric bursitis, right hip: Secondary | ICD-10-CM

## 2013-05-24 DIAGNOSIS — D6859 Other primary thrombophilia: Secondary | ICD-10-CM

## 2013-05-24 DIAGNOSIS — D72829 Elevated white blood cell count, unspecified: Secondary | ICD-10-CM

## 2013-05-24 MED ORDER — FLUCONAZOLE 150 MG PO TABS: ORAL_TABLET | ORAL | Status: DC

## 2013-05-24 NOTE — Progress Notes (Signed)
Pre visit review using our clinic review tool, if applicable. No additional management support is needed unless otherwise documented below in the visit note. 

## 2013-05-24 NOTE — Telephone Encounter (Signed)
Per Dr. Beryle Beams; confirmed with pt that he did receive MD message last night and stated he is taking his Xarelto 20 mg daily.  Note to Dr. Beryle Beams.

## 2013-05-24 NOTE — Patient Instructions (Addendum)
Encouraged increased hydration and fiber in diet. Daily probiotics. If bowels not moving can use MOM 2 tbls po in 4 oz of warm prune juice by mouth every 2-3 days. If no results then repeat in 4 hours with  Dulcolax suppository pr, may repeat again in 4 more hours as needed. Seek care if symptoms worsen. Consider daily Miralax and/or Dulcolax if symptoms persist.   Candida Infection, Adult A candida infection (also called yeast, fungus and Monilia infection) is an overgrowth of yeast that can occur anywhere on the body. A yeast infection commonly occurs in warm, moist body areas. Usually, the infection remains localized but can spread to become a systemic infection. A yeast infection may be a sign of a more severe disease such as diabetes, leukemia, or AIDS. A yeast infection can occur in both men and women. In women, Candida vaginitis is a vaginal infection. It is one of the most common causes of vaginitis. Men usually do not have symptoms or know they have an infection until other problems develop. Men may find out they have a yeast infection because their sex partner has a yeast infection. Uncircumcised men are more likely to get a yeast infection than circumcised men. This is because the uncircumcised glans is not exposed to air and does not remain as dry as that of a circumcised glans. Older adults may develop yeast infections around dentures. CAUSES  Women  Antibiotics.  Steroid medication taken for a long time.  Being overweight (obese).  Diabetes.  Poor immune condition.  Certain serious medical conditions.  Immune suppressive medications for organ transplant patients.  Chemotherapy.  Pregnancy.  Menstration.  Stress and fatigue.  Intravenous drug use.  Oral contraceptives.  Wearing tight-fitting clothes in the crotch area.  Catching it from a sex partner who has a yeast infection.  Spermicide.  Intravenous, urinary, or other catheters. Men  Catching it from a sex  partner who has a yeast infection.  Having oral or anal sex with a person who has the infection.  Spermicide.  Diabetes.  Antibiotics.  Poor immune system.  Medications that suppress the immune system.  Intravenous drug use.  Intravenous, urinary, or other catheters. SYMPTOMS  Women  Thick, white vaginal discharge.  Vaginal itching.  Redness and swelling in and around the vagina.  Irritation of the lips of the vagina and perineum.  Blisters on the vaginal lips and perineum.  Painful sexual intercourse.  Low blood sugar (hypoglycemia).  Painful urination.  Bladder infections.  Intestinal problems such as constipation, indigestion, bad breath, bloating, increase in gas, diarrhea, or loose stools. Men  Men may develop intestinal problems such as constipation, indigestion, bad breath, bloating, increase in gas, diarrhea, or loose stools.  Dry, cracked skin on the penis with itching or discomfort.  Jock itch.  Dry, flaky skin.  Athlete's foot.  Hypoglycemia. DIAGNOSIS  Women  A history and an exam are performed.  The discharge may be examined under a microscope.  A culture may be taken of the discharge. Men  A history and an exam are performed.  Any discharge from the penis or areas of cracked skin will be looked at under the microscope and cultured.  Stool samples may be cultured. TREATMENT  Women  Vaginal antifungal suppositories and creams.  Medicated creams to decrease irritation and itching on the outside of the vagina.  Warm compresses to the perineal area to decrease swelling and discomfort.  Oral antifungal medications.  Medicated vaginal suppositories or cream for repeated or  recurrent infections.  Wash and dry the irritation areas before applying the cream.  Eating yogurt with lactobacillus may help with prevention and treatment.  Sometimes painting the vagina with gentian violet solution may help if creams and suppositories do  not work. Men  Antifungal creams and oral antifungal medications.  Sometimes treatment must continue for 30 days after the symptoms go away to prevent recurrence. HOME CARE INSTRUCTIONS  Women  Use cotton underwear and avoid tight-fitting clothing.  Avoid colored, scented toilet paper and deodorant tampons or pads.  Do not douche.  Keep your diabetes under control.  Finish all the prescribed medications.  Keep your skin clean and dry.  Consume milk or yogurt with lactobacillus active culture regularly. If you get frequent yeast infections and think that is what the infection is, there are over-the-counter medications that you can get. If the infection does not show healing in 3 days, talk to your caregiver.  Tell your sex partner you have a yeast infection. Your partner may need treatment also, especially if your infection does not clear up or recurs. Men  Keep your skin clean and dry.  Keep your diabetes under control.  Finish all prescribed medications.  Tell your sex partner that you have a yeast infection so they can be treated if necessary. SEEK MEDICAL CARE IF:   Your symptoms do not clear up or worsen in one week after treatment.  You have an oral temperature above 102 F (38.9 C).  You have trouble swallowing or eating for a prolonged time.  You develop blisters on and around your vagina.  You develop vaginal bleeding and it is not your menstrual period.  You develop abdominal pain.  You develop intestinal problems as mentioned above.  You get weak or lightheaded.  You have painful or increased urination.  You have pain during sexual intercourse. MAKE SURE YOU:   Understand these instructions.  Will watch your condition.  Will get help right away if you are not doing well or get worse. Document Released: 03/27/2004 Document Revised: 05/12/2011 Document Reviewed: 07/09/2009 [facility] Patient Information 2014 [place].

## 2013-05-29 ENCOUNTER — Encounter: Payer: Self-pay | Admitting: Family Medicine

## 2013-05-29 DIAGNOSIS — D72829 Elevated white blood cell count, unspecified: Secondary | ICD-10-CM | POA: Insufficient documentation

## 2013-05-29 NOTE — Progress Notes (Signed)
Patient ID: Paul Ryan, male   DOB: 07/20/1945, 68 y.o.   MRN: [id number] Paul Ryan [id number] 1945/10/21 05/29/2013      Progress Note-Follow Up  Subjective  Chief Complaint  Chief Complaint  Patient presents with  . Follow-up    3 month    HPI  Patient is a 68 year old male in today for routine medical care. He notes his superficial irritation has improved somewhat with witch hazel cleansing but it does continue. No lesions or pain. No fevers or chills. No abdominal or back pain. His hip pain is greatly improved with a steroid injection and stretching. He offers no acute complaints. Denies any signs of upper respiratory infection with gastroenteritis. Denies CP/palp/SOB/HA/congestion/fevers/GI or GU c/o. Taking meds as prescribed  Past Medical History  Diagnosis Date  . DVT (deep venous thrombosis)     takes coumadin  . Shingles 1982    right abdominal wall  . Allergy     seasonal  . Hyperlipidemia   . Left hip pain 12/17/2010  . Preventative health care 12/17/2010  . 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 Hypothyroid 06/10/2011  . Vitamin D deficiency 10/14/2011  . Dysuria 02/27/2013    Past Surgical History  Procedure Laterality Date  . 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 Cancer Mother     unknown- everywhere  . Cancer Sister     breast    History   Social History  . Marital Status: Married    Spouse Name: N/A    Number of Children: N/A  . Years of Education: N/A   Occupational History  . Not on file.   Social History Main Topics  . Smoking status: Former Smoker -- 1.00 packs/day for 20 years    Types: Cigarettes    Quit date: 03/03/1980  . Smokeless tobacco: Never Used  . Alcohol Use: No  . Drug Use: No  . Sexual Activity: Yes    Partners: Female   Other Topics Concern  . Not on file   Social History Narrative  . No narrative on file     Current Outpatient Prescriptions on File Prior to Visit  Medication Sig Dispense Refill  . ARMOUR THYROID 90 MG tablet TAKE ONE TABLET BY MOUTH ONE TIME DAILY   45 tablet  1  . Ascorbic Acid (VITAMIN C) 1000 MG tablet Take 1,000 mg by mouth daily.        [name] Kitchen b complex vitamins tablet Take 1 tablet by mouth daily.        . Cholecalciferol (VITAMIN D3 SUPER STRENGTH) 2000 UNITS TABS Take 2 tablets by mouth daily.        . Coenzyme Q10 (CO Q10) 100 MG CAPS Take 2 capsules by mouth daily.        Javier Docker Oil 300 MG CAPS 1 cap daily      . Rivaroxaban (XARELTO) 20 MG TABS tablet Take 1 tablet (20 mg total) by mouth daily.  30 tablet  6  . Rivaroxaban (XARELTO) 20 MG TABS tablet Take 1 tablet (20 mg total) by mouth daily with supper.  30 tablet  0  . rosuvastatin (CRESTOR) 20 MG tablet Take 1 tablet (20 mg total) by mouth daily.  90 tablet  1  . tacrolimus (PROTOPIC) 0.1 % ointment Apply topically 2 (two) times daily.  100 g  1  .  thyroid (ARMOUR THYROID) 60 MG tablet Take 1 tablet (60 mg total) by mouth daily before breakfast.  45 tablet  2   No current facility-administered medications on file prior to visit.    No Known Allergies  Review of Systems  Review of Systems  Constitutional: Negative for fever and malaise/fatigue.  HENT: Negative for congestion.   Eyes: Negative for discharge.  Respiratory: Negative for shortness of breath.   Cardiovascular: Negative for chest pain, palpitations and leg swelling.  Gastrointestinal: Negative for nausea, abdominal pain and diarrhea.  Genitourinary: Negative for dysuria.  Musculoskeletal: Positive for joint pain. Negative for falls.  Skin: Negative for rash.  Neurological: Negative for loss of consciousness and headaches.  Endo/Heme/Allergies: Negative for polydipsia.  Psychiatric/Behavioral: Negative for depression and suicidal ideas. The patient is not nervous/anxious and does not have insomnia.     Objective  BP 108/68  Pulse 51   Temp(Src) 98.5 F (36.9 C) (Oral)  Ht 5\' 11"  (1.803 m)  Wt 163 lb (73.936 kg)  BMI 22.74 kg/m2  SpO2 98%  Physical Exam  Physical Exam  Constitutional: He is oriented to person, place, and time and well-developed, well-nourished, and in no distress. No distress.  HENT:  Head: Normocephalic and atraumatic.  Eyes: Conjunctivae are normal.  Neck: Neck supple. No thyromegaly present.  Cardiovascular: Normal rate, regular rhythm and normal heart sounds.   No murmur heard. Pulmonary/Chest: Effort normal and breath sounds normal. No respiratory distress.  Abdominal: He exhibits no distension and no mass. There is no tenderness.  Musculoskeletal: He exhibits no edema.  Neurological: He is alert and oriented to person, place, and time.  Skin: Skin is warm.  Psychiatric: Memory, affect and judgment normal.    Lab Results  Component Value Date   TSH 0.59 03/11/2013   Lab Results  Component Value Date   WBC 12.3* 05/20/2013   HGB 15.9 05/20/2013   HCT 47.8 05/20/2013   MCV 92.3 05/20/2013   PLT 223.0 05/20/2013   Lab Results  Component Value Date   CREATININE 1.2 03/11/2013   BUN 16 03/11/2013   NA 140 03/11/2013   K 4.5 03/11/2013   CL 107 03/11/2013   CO2 28 03/11/2013   Lab Results  Component Value Date   ALT 33 03/11/2013   AST 23 03/11/2013   ALKPHOS 44 03/11/2013   BILITOT 0.8 03/11/2013   Lab Results  Component Value Date   CHOL 218* 03/11/2013   Lab Results  Component Value Date   HDL 43.60 03/11/2013   Lab Results  Component Value Date   LDLCALC 113* 05/22/2011   LDLCALC 116* 05/22/2011   Lab Results  Component Value Date   TRIG 80.0 03/11/2013   Lab Results  Component Value Date   CHOLHDL 5 03/11/2013     Assessment & Plan  Hereditary protein S deficiency Is pleased with switch to Xarelto from Coumadin.   Greater trochanteric bursitis of right hip Good relief with steroid injection. Is working on new stretches and feels it is helping  Candida infection Encouraged  minimize carbohydrates and given rx for Diflucan  Leukocytosis, unspecified Likely exacerbated by recent steroid injection and candida, will monitor

## 2013-05-29 NOTE — Assessment & Plan Note (Signed)
Likely exacerbated by recent steroid injection and candida, will monitor

## 2013-05-29 NOTE — Assessment & Plan Note (Signed)
Encouraged minimize carbohydrates and given rx for Diflucan

## 2013-05-29 NOTE — Assessment & Plan Note (Signed)
Good relief with steroid injection. Is working on new stretches and feels it is helping

## 2013-05-29 NOTE — Assessment & Plan Note (Signed)
Is pleased with switch to Xarelto from Coumadin.

## 2013-06-01 ENCOUNTER — Other Ambulatory Visit: Payer: Self-pay | Admitting: Family Medicine

## 2013-06-01 ENCOUNTER — Encounter: Payer: Self-pay | Admitting: Family Medicine

## 2013-06-01 DIAGNOSIS — E785 Hyperlipidemia, unspecified: Secondary | ICD-10-CM

## 2013-06-01 MED ORDER — ROSUVASTATIN CALCIUM 20 MG PO TABS: 20.0000 mg | ORAL_TABLET | Freq: Every day | ORAL | Status: DC

## 2013-06-01 MED ORDER — RIVAROXABAN 20 MG PO TABS: 20.0000 mg | ORAL_TABLET | Freq: Every day | ORAL | Status: DC

## 2013-06-07 ENCOUNTER — Ambulatory Visit: Payer: BC Managed Care – PPO | Admitting: Family Medicine

## 2013-06-14 ENCOUNTER — Encounter: Payer: Self-pay | Admitting: Oncology

## 2013-06-14 ENCOUNTER — Ambulatory Visit (INDEPENDENT_AMBULATORY_CARE_PROVIDER_SITE_OTHER): Payer: BC Managed Care – PPO | Admitting: Oncology

## 2013-06-14 VITALS — BP 106/63 | HR 60 | Temp 98.3°F | Ht 71.0 in | Wt 161.7 lb

## 2013-06-14 DIAGNOSIS — I82409 Acute embolism and thrombosis of unspecified deep veins of unspecified lower extremity: Secondary | ICD-10-CM

## 2013-06-14 DIAGNOSIS — D689 Coagulation defect, unspecified: Secondary | ICD-10-CM

## 2013-06-14 DIAGNOSIS — D72829 Elevated white blood cell count, unspecified: Secondary | ICD-10-CM

## 2013-06-14 DIAGNOSIS — Z7901 Long term (current) use of anticoagulants: Secondary | ICD-10-CM

## 2013-06-14 HISTORY — DX: Long term (current) use of anticoagulants: Z79.01

## 2013-06-14 LAB — CBC WITH DIFFERENTIAL/PLATELET
BASOS PCT: 0 % (ref 0–1)
Basophils Absolute: 0 10*3/uL (ref 0.0–0.1)
EOS ABS: 0.1 10*3/uL (ref 0.0–0.7)
Eosinophils Relative: 1 % (ref 0–5)
HCT: 45 % (ref 39.0–52.0)
Hemoglobin: 15.5 g/dL (ref 13.0–17.0)
Lymphocytes Relative: 34 % (ref 12–46)
Lymphs Abs: 2.1 10*3/uL (ref 0.7–4.0)
MCH: 31.9 pg (ref 26.0–34.0)
MCHC: 34.4 g/dL (ref 30.0–36.0)
MCV: 92.6 fL (ref 78.0–100.0)
MONOS PCT: 8 % (ref 3–12)
Monocytes Absolute: 0.5 10*3/uL (ref 0.1–1.0)
NEUTROS PCT: 57 % (ref 43–77)
Neutro Abs: 3.5 10*3/uL (ref 1.7–7.7)
Platelets: 159 10*3/uL (ref 150–400)
RBC: 4.86 MIL/uL (ref 4.22–5.81)
RDW: 14.3 % (ref 11.5–15.5)
WBC: 6.1 10*3/uL (ref 4.0–10.5)

## 2013-06-14 LAB — SAVE SMEAR

## 2013-06-14 NOTE — Patient Instructions (Signed)
Continue Xarelto at current dose of 20 mg daily\ Blood count today to re-assess elevated white cell count Visit with Dr Darnell Level in 1 year 30 minutes  blood count day of visit Call if any planned surgical procedures for advice on what to do with blood thinner [employer]!!

## 2013-06-14 NOTE — Progress Notes (Signed)
Patient ID: Paul Ryan, male   DOB: 02-26-1946, 68 y.o.   MRN: [id number] Hematology and Oncology Follow Up Visit  Paul Ryan [id number] 05-17-1945 68 y.o. 06/14/2013 9:44 AM   Principle Diagnosis: Encounter Diagnoses  Name Primary?  . Chronic anticoagulation Yes  . 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 Preliminary hypercoagulation evaluation unremarkable, except for persistent elevation of D-dimer. He was kept on Coumadin for about 1 year due to the fact that he travels frequently to [place]. When he stopped the Coumadin, I checked protein S and C levels. He had a reproducible decrease in functional protein S with borderline decrease in free protein S. I elected to put him back on Coumadin.  We have had a number of  lengthy discussions about risks versus benefits of changing over to one of the new oral anticoagulants. Now that we have more experience with these agents and they appear to be as safe as Coumadin, despite the fact that we still don't have any specific antidotes, I recommended that he change over to Rivaroxaban and 20 mg daily. He made this transition about 2 months ago. So far he is tolerating the drug well. Curiously, when his blood counts were checked and his primary care physician's office back in March he had an unexplained mild elevation of his total white count 10,500 with a normal differential. Lab was repeated and white count was reproducibly high at 12,300 on March 20. Normal hemoglobin and platelet count. No interim infection. No other new medication. In fact he stopped tacrolimus eyedrops. He was on a brief course of Diflucan which he also stopped. He remains asymptomatic. He has had no interim medical problems. He is planning another long trip to [place] next month. He denies any dyspnea chest pain or palpitations. No  leg swelling or pain. His family is doing well. His daughter will be getting married next June in [place].     Medications: reviewed  Allergies: No Known Allergies  Review of Systems: Hematology:  No bleeding or bruising ENT ROS: No sore throat Breast ROS:  Respiratory ROS: No cough or dyspnea Cardiovascular ROS:  No chest pain or palpitations Gastrointestinal ROS:  No abdominal pain or change in bowel habit  Genito-Urinary ROS: Not questioned Musculoskeletal ROS: He still getting some occasional muscle cramps Neurological ROS: No headache or change in vision Dermatological ROS: No rash or ecchymosis Remaining ROS negative:   Physical Exam: Blood pressure 106/63, pulse 60, temperature 98.3 F (36.8 C), temperature source Oral, height 5\' 11"  (1.803 m), weight 161 lb 11.2 oz (73.347 kg), SpO2 97.00%. Wt Readings from Last 3 Encounters:  06/14/13 161 lb 11.2 oz (73.347 kg)  05/24/13 163 lb (73.936 kg)  05/16/13 164 lb (74.39 kg)     General appearance: Thin Asian man HENNT: Pharynx no erythema, exudate, mass, or ulcer. No thyromegaly or thyroid nodules Lymph nodes: No cervical, supraclavicular, or axillary lymphadenopathy Breasts:  Lungs: Clear to auscultation, resonant to percussion throughout Heart: Regular rhythm, no murmur, no gallop, no rub, no click, no edema Abdomen: Soft, nontender, normal bowel sounds, no mass, no organomegaly Extremities: No edema, no calf tenderness Musculoskeletal: no joint deformities GU:  Vascular: Carotid pulses 2+, no bruits,  Neurologic: Alert, oriented, PERRLA,  cranial nerves grossly normal, motor strength 5 over 5, reflexes 1+ symmetric, upper body coordination normal, gait normal, Skin:  No rash or ecchymosis  Lab Results: CBC W/Diff    Component Value Date/Time   WBC 12.3* 05/20/2013 1007   WBC 10.5* 05/17/2013 0902   RBC 5.18 05/20/2013 1007   RBC 4.99 05/17/2013 0902   HGB 15.9 05/20/2013 1007   HGB 15.2 05/17/2013 0902   HCT 47.8  05/20/2013 1007   HCT 44.6 05/17/2013 0902   PLT 223.0 05/20/2013 1007   PLT 181 05/17/2013 0902   MCV 92.3 05/20/2013 1007   MCV 89.4 05/17/2013 0902   MCH 30.5 05/17/2013 0902   MCHC 33.2 05/20/2013 1007   MCHC 34.1 05/17/2013 0902   RDW 14.6 05/20/2013 1007   RDW 13.7 05/17/2013 0902   LYMPHSABS 2.1 05/20/2013 1007   LYMPHSABS 1.7 05/17/2013 0902   MONOABS 0.5 05/20/2013 1007   MONOABS 0.4 05/17/2013 0902   EOSABS 0.0 05/20/2013 1007   EOSABS 0.0 05/17/2013 0902   BASOSABS 0.0 05/20/2013 1007   BASOSABS 0.0 05/17/2013 0902     Chemistry      Component Value Date/Time   NA 140 03/11/2013 0905   NA 143 11/16/2012 1145   K 4.5 03/11/2013 0905   K 4.4 11/16/2012 1145   CL 107 03/11/2013 0905   CO2 28 03/11/2013 0905   CO2 27 11/16/2012 1145   BUN 16 03/11/2013 0905   BUN 16.2 11/16/2012 1145   CREATININE 1.2 03/11/2013 0905   CREATININE 1.1 11/16/2012 1145      Component Value Date/Time   CALCIUM 8.9 03/11/2013 0905   CALCIUM 9.3 11/16/2012 1145   ALKPHOS 44 03/11/2013 0905   ALKPHOS 48 11/16/2012 1145   AST 23 03/11/2013 0905   AST 23 11/16/2012 1145   ALT 33 03/11/2013 0905   ALT 40 11/16/2012 1145   BILITOT 0.8 03/11/2013 0905   BILITOT 0.75 11/16/2012 1145       Radiological Studies: [place] Extrem Low Right Ltd  05/17/2013   [facility] US performed of: Right  This study was ordered, performed, and interpreted by Charlann Boxer D.O.  Hip:  Trochanteric bursa with swelling. Patient's posterior greater trochanteric  bursa though has the significant amount of swelling as well as calcific  changes.  Acetabular labrum visualized and without tears, displacement, or effusion  in joint.  Femoral neck appears unremarkable without increased power doppler signal  along Cortex.   05/17/2013   Calcific greater trochanteric bursitis    Procedure: Real-time Ultrasound Guided Injection of right greater  trochanteric bursitis secondary to patient's body habitus  Device: GE Logiq E  Ultrasound guided injection is preferred based studies that  show increased  duration, increased effect, greater accuracy, decreased procedural pain,  increased response rate, and decreased cost with ultrasound guided versus  blind injection.  Verbal informed consent obtained.  Time-out conducted.  Noted no overlying erythema, induration, or other signs of local  infection.  Skin prepped in a sterile fashion.  Local anesthesia: Topical Ethyl chloride.  With sterile technique and under real time ultrasound guidance: Greater  trochanteric area was visualized and patient's bursa was noted. A 22-gauge  3 inch needle was inserted and 4 cc of 0.5% Marcaine and 1 cc of Kenalog  40 mg/dL was injected. Pictures taken  Completed without difficulty  Pain immediately resolved suggesting accurate placement of the medication.   Advised to call if fevers/chills, erythema, induration, drainage, or  persistent bleeding.  Images permanently stored and available for review in the ultrasound unit.   Impression: Technically successful ultrasound guided injection.    Impression:  #  1. 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 Unprovoked right lower extremity DVT secondary to #1.  #3. Chronic anticoagulation now changed over to Xarelto.  #4. Nonspecific mild elevation of white blood cell count I will repeat a CBC today. If his white count has returned to normal, I'll see him again in one year, if not, repeat interim blood count and consider additional studies including PPD and chest x-ray.   CC: Patient Care Team: [name], MD as PCP - General (Family Medicine)   Annia Belt, MD 4/14/20159:44 AM

## 2013-06-15 ENCOUNTER — Telehealth: Payer: Self-pay | Admitting: *Deleted

## 2013-06-15 NOTE — Telephone Encounter (Signed)
Message copied by Ebbie Latus on Wed Jun 15, 2013 10:00 AM ------      Message from: Annia Belt      Created: Tue Jun 14, 2013  2:30 PM       Call pt: white count back to normal ------

## 2013-06-15 NOTE — Telephone Encounter (Signed)
Called pt - informed pt white count back to normal per [name].

## 2013-06-23 ENCOUNTER — Other Ambulatory Visit (INDEPENDENT_AMBULATORY_CARE_PROVIDER_SITE_OTHER): Payer: BC Managed Care – PPO

## 2013-06-23 DIAGNOSIS — D72829 Elevated white blood cell count, unspecified: Secondary | ICD-10-CM

## 2013-06-23 LAB — CBC
HCT: 46.1 % (ref 39.0–52.0)
HEMOGLOBIN: 15.3 g/dL (ref 13.0–17.0)
MCHC: 33.1 g/dL (ref 30.0–36.0)
MCV: 93.9 fl (ref 78.0–100.0)
Platelets: 204 10*3/uL (ref 150.0–400.0)
RBC: 4.91 Mil/uL (ref 4.22–5.81)
RDW: 15.1 % — ABNORMAL HIGH (ref 11.5–14.6)
WBC: 7.4 10*3/uL (ref 4.5–10.5)

## 2013-06-29 ENCOUNTER — Encounter: Payer: Self-pay | Admitting: Oncology

## 2013-06-29 ENCOUNTER — Encounter: Payer: Self-pay | Admitting: Family Medicine

## 2013-06-29 ENCOUNTER — Telehealth: Payer: Self-pay | Admitting: Family Medicine

## 2013-06-29 DIAGNOSIS — E039 Hypothyroidism, unspecified: Secondary | ICD-10-CM

## 2013-06-29 NOTE — Telephone Encounter (Signed)
Refill- armour

## 2013-06-30 ENCOUNTER — Other Ambulatory Visit: Payer: Self-pay | Admitting: Family Medicine

## 2013-06-30 MED ORDER — THYROID 60 MG PO TABS: 60.0000 mg | ORAL_TABLET | Freq: Every day | ORAL | Status: DC

## 2013-06-30 NOTE — Telephone Encounter (Signed)
Already done

## 2013-07-01 MED ORDER — THYROID 90 MG PO TABS: ORAL_TABLET | ORAL | Status: DC

## 2013-07-01 NOTE — Addendum Note (Signed)
Addended by: Varney Daily on: 07/01/2013 06:57 AM   Modules accepted: Orders

## 2013-08-16 ENCOUNTER — Other Ambulatory Visit: Payer: BC Managed Care – PPO

## 2013-09-26 ENCOUNTER — Other Ambulatory Visit: Payer: Self-pay | Admitting: Family Medicine

## 2013-10-10 ENCOUNTER — Encounter: Payer: Self-pay | Admitting: Family

## 2013-10-10 ENCOUNTER — Ambulatory Visit (INDEPENDENT_AMBULATORY_CARE_PROVIDER_SITE_OTHER): Payer: Medicare Other | Admitting: Family

## 2013-10-10 VITALS — BP 100/66 | HR 48 | Temp 97.7°F | Resp 16 | Ht 71.0 in | Wt 154.1 lb

## 2013-10-10 DIAGNOSIS — E039 Hypothyroidism, unspecified: Secondary | ICD-10-CM

## 2013-10-10 DIAGNOSIS — S0510XA Contusion of eyeball and orbital tissues, unspecified eye, initial encounter: Secondary | ICD-10-CM

## 2013-10-10 DIAGNOSIS — E785 Hyperlipidemia, unspecified: Secondary | ICD-10-CM

## 2013-10-10 DIAGNOSIS — S0591XA Unspecified injury of right eye and orbit, initial encounter: Secondary | ICD-10-CM | POA: Insufficient documentation

## 2013-10-10 DIAGNOSIS — S058X9A Other injuries of unspecified eye and orbit, initial encounter: Secondary | ICD-10-CM | POA: Diagnosis not present

## 2013-10-10 LAB — HEPATIC FUNCTION PANEL
ALT: 59 U/L — ABNORMAL HIGH (ref 0–53)
AST: 36 U/L (ref 0–37)
Albumin: 4.3 g/dL (ref 3.5–5.2)
Alkaline Phosphatase: 57 U/L (ref 39–117)
BILIRUBIN DIRECT: 0.1 mg/dL (ref 0.0–0.3)
BILIRUBIN TOTAL: 0.7 mg/dL (ref 0.2–1.2)
Indirect Bilirubin: 0.6 mg/dL (ref 0.2–1.2)
Total Protein: 7.1 g/dL (ref 6.0–8.3)

## 2013-10-10 LAB — CBC
HCT: 45.8 % (ref 39.0–52.0)
Hemoglobin: 15.8 g/dL (ref 13.0–17.0)
MCH: 31.1 pg (ref 26.0–34.0)
MCHC: 34.5 g/dL (ref 30.0–36.0)
MCV: 90.2 fL (ref 78.0–100.0)
Platelets: 202 10*3/uL (ref 150–400)
RBC: 5.08 MIL/uL (ref 4.22–5.81)
RDW: 13.8 % (ref 11.5–15.5)
WBC: 6.1 10*3/uL (ref 4.0–10.5)

## 2013-10-10 LAB — RENAL FUNCTION PANEL
Albumin: 4.3 g/dL (ref 3.5–5.2)
BUN: 13 mg/dL (ref 6–23)
CO2: 27 mEq/L (ref 19–32)
CREATININE: 1 mg/dL (ref 0.50–1.35)
Calcium: 9.5 mg/dL (ref 8.4–10.5)
Chloride: 106 mEq/L (ref 96–112)
Glucose, Bld: 92 mg/dL (ref 70–99)
PHOSPHORUS: 2.9 mg/dL (ref 2.3–4.6)
Potassium: 5.3 mEq/L (ref 3.5–5.3)
Sodium: 141 mEq/L (ref 135–145)

## 2013-10-10 LAB — LIPID PANEL
Cholesterol: 201 mg/dL — ABNORMAL HIGH (ref 0–200)
HDL: 50 mg/dL (ref >39)
LDL Cholesterol: 132 mg/dL — ABNORMAL HIGH (ref 0–99)
Total CHOL/HDL Ratio: 4 Ratio
Triglycerides: 96 mg/dL (ref <150)
VLDL: 19 mg/dL (ref 0–40)

## 2013-10-10 LAB — TSH: TSH: 1.229 u[IU]/mL (ref 0.350–4.500)

## 2013-10-10 NOTE — Progress Notes (Signed)
Subjective:    Patient ID: Paul Ryan, male    DOB: 1945/06/15, 68 y.o.   MRN: [id number]  HPI  Paul Ryan is a 68 yr old male who presents today with chief complaint of eye injury. Reports that on Friday a branch hit his eye on Friday while doing yard work. Reports that the eye continues to weep. + blurring.  + discomfort, "like something is in my eye."     Review of Systems    see HPI  Past Medical History  Diagnosis Date  . DVT (deep venous thrombosis)     takes coumadin  . Shingles 1982    right abdominal wall  . Allergy     seasonal  . Hyperlipidemia   . Left hip pain 12/17/2010  . Preventative health care 12/17/2010  . 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 Hypothyroid 06/10/2011  . Vitamin D deficiency 10/14/2011  . Dysuria 02/27/2013  . Chronic anticoagulation 06/14/2013    History   Social History  . Marital Status: Married    Spouse Name: N/A    Number of Children: N/A  . Years of Education: N/A   Occupational History  . Not on file.   Social History Main Topics  . Smoking status: Former Smoker -- 1.00 packs/day for 20 years    Types: Cigarettes    Quit date: 03/03/1980  . Smokeless tobacco: Never Used  . Alcohol Use: No  . Drug Use: No  . Sexual Activity: Not on file   Other Topics Concern  . Not on file   Social History Narrative  . No narrative on file    Past Surgical History  Procedure Laterality Date  . 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 Cancer Mother     unknown- everywhere  . Cancer Sister     breast    No Known Allergies  Current Outpatient Prescriptions on File Prior to Visit  Medication Sig Dispense Refill  . Ascorbic Acid (VITAMIN C) 1000 MG tablet Take 1,000 mg by mouth daily.        [name] Kitchen b complex vitamins tablet Take 1 tablet by mouth daily.        . Cholecalciferol (VITAMIN D3 SUPER STRENGTH) 2000 UNITS TABS Take 2  tablets by mouth daily.        . Coenzyme Q10 (CO Q10) 100 MG CAPS Take 2 capsules by mouth daily.        . CRESTOR 20 MG tablet TAKE ONE TABLET BY MOUTH ONE TIME DAILY   60 tablet  0  . Krill Oil 300 MG CAPS 1 cap daily      . Rivaroxaban (XARELTO) 20 MG TABS tablet Take 1 tablet (20 mg total) by mouth daily with supper.  90 tablet  1  . tacrolimus (PROTOPIC) 0.1 % ointment Apply topically 2 (two) times daily.  100 g  1  . thyroid (ARMOUR THYROID) 60 MG tablet Take 1 tablet (60 mg total) by mouth daily before breakfast.  45 tablet  1  . thyroid (ARMOUR THYROID) 90 MG tablet Alternate this with Armour 60 mg  45 tablet  1   No current facility-administered medications on file prior to visit.    BP 100/66  Pulse 48  Temp(Src) 97.7 F (36.5 C) (Oral)  Resp 16  Ht 5\' 11"  (1.803 m)  Wt 154 lb 1.9  oz (69.908 kg)  BMI 21.50 kg/m2  SpO2 99%    Objective:   Physical Exam  Constitutional: He appears well-developed and well-nourished. No distress.  HENT:  Head: Normocephalic and atraumatic.  Eyes: Pupils are equal, round, and reactive to light. Right eye exhibits no discharge. No foreign body present in the right eye. Right conjunctiva is injected. Left conjunctiva is not injected. No scleral icterus.  Right lid is mildly swollen.    Psychiatric: He has a normal mood and affect. His behavior is normal. Judgment and thought content normal.          Assessment & Plan:

## 2013-10-10 NOTE — Assessment & Plan Note (Signed)
Will refer to opthalmology today for further evaluation.

## 2013-10-10 NOTE — Patient Instructions (Signed)
Please stop by the front desk to obtain appointment info for the eye doctor today. Complete fasting labs prior to leaving. Keep upcoming appointment with Dr. Charlett Blake.

## 2013-10-10 NOTE — Progress Notes (Signed)
Pre visit review using our clinic review tool, if applicable. No additional management support is needed unless otherwise documented below in the visit note. 

## 2013-10-12 DIAGNOSIS — S058X9A Other injuries of unspecified eye and orbit, initial encounter: Secondary | ICD-10-CM | POA: Diagnosis not present

## 2013-10-21 DIAGNOSIS — S058X9A Other injuries of unspecified eye and orbit, initial encounter: Secondary | ICD-10-CM | POA: Diagnosis not present

## 2013-10-25 ENCOUNTER — Encounter: Payer: Self-pay | Admitting: Family Medicine

## 2013-10-25 ENCOUNTER — Ambulatory Visit (INDEPENDENT_AMBULATORY_CARE_PROVIDER_SITE_OTHER): Payer: Medicare Other | Admitting: Family Medicine

## 2013-10-25 VITALS — BP 110/62 | HR 59 | Temp 97.8°F | Ht 71.0 in | Wt 156.1 lb

## 2013-10-25 DIAGNOSIS — D6859 Other primary thrombophilia: Secondary | ICD-10-CM | POA: Diagnosis not present

## 2013-10-25 DIAGNOSIS — E038 Other specified hypothyroidism: Secondary | ICD-10-CM

## 2013-10-25 DIAGNOSIS — E785 Hyperlipidemia, unspecified: Secondary | ICD-10-CM | POA: Diagnosis not present

## 2013-10-25 DIAGNOSIS — Z23 Encounter for immunization: Secondary | ICD-10-CM | POA: Diagnosis not present

## 2013-10-25 DIAGNOSIS — Z7901 Long term (current) use of anticoagulants: Secondary | ICD-10-CM

## 2013-10-25 MED ORDER — ROSUVASTATIN CALCIUM 20 MG PO TABS: ORAL_TABLET | ORAL | Status: DC

## 2013-10-25 NOTE — Progress Notes (Signed)
Pre visit review using our clinic review tool, if applicable. No additional management support is needed unless otherwise documented below in the visit note. 

## 2013-10-25 NOTE — Patient Instructions (Addendum)
Needs medicare PSA, liver, acute hep panel in 1 month for elevated LFTs and preventative, labs always at [facility]  Curcumen/Turmeric daily for inflammation [email or web address], NOWCholesterol Cholesterol is a white, waxy, fat-like substance needed by your body in small amounts. The liver makes all the cholesterol you need. Cholesterol is carried from the liver by the blood through the blood vessels. Deposits of cholesterol (plaque) may build up on blood vessel walls. These make the arteries narrower and stiffer. Cholesterol plaques increase the risk for heart attack and stroke.  You cannot feel your cholesterol level even if it is very high. The only way to know it is high is with a blood test. Once you know your cholesterol levels, you should keep a record of the test results. Work with your health care provider to keep your levels in the desired range.  WHAT DO THE RESULTS MEAN?  Total cholesterol is a rough measure of all the cholesterol in your blood.   LDL is the so-called bad cholesterol. This is the type that deposits cholesterol in the walls of the arteries. You want this level to be low.   HDL is the good cholesterol because it cleans the arteries and carries the LDL away. You want this level to be high.  Triglycerides are fat that the body can either burn for energy or store. High levels are closely linked to heart disease.  WHAT ARE THE DESIRED LEVELS OF CHOLESTEROL?  Total cholesterol below 200.   LDL below 100 for people at risk, below 70 for those at very high risk.   HDL above 50 is good, above 60 is best.   Triglycerides below 150.  HOW CAN I LOWER MY CHOLESTEROL?  Diet. Follow your diet programs as directed by your health care provider.   Choose fish or white meat chicken and [place], roasted or baked. Limit fatty cuts of red meat, fried foods, and processed meats, such as sausage and lunch meats.   Eat lots of fresh fruits and vegetables.  Choose whole grains,  beans, pasta, potatoes, and cereals.   Use only small amounts of olive, corn, or canola oils.   Avoid butter, mayonnaise, shortening, or palm kernel oils.  Avoid foods with trans fats.   Drink skim or nonfat milk and eat low-fat or nonfat yogurt and cheeses. Avoid whole milk, cream, ice cream, egg yolks, and full-fat cheeses.   Healthy desserts include angel food cake, ginger snaps, animal crackers, hard candy, popsicles, and low-fat or nonfat frozen yogurt. Avoid pastries, cakes, pies, and cookies.   Exercise. Follow your exercise programs as directed by your health care provider.   A regular program helps decrease LDL and raise HDL.   A regular program helps with weight control.   Do things that increase your activity level like gardening, walking, or taking the stairs. Ask your health care provider about how you can be more active in your daily life.   Medicine. Take medicine only as directed by your health care provider.   Medicine may be prescribed by your health care provider to help lower cholesterol and decrease the risk for heart disease.   If you have several risk factors, you may need medicine even if your levels are normal. Document Released: 11/12/2000 Document Revised: 07/04/2013 Document Reviewed: 12/01/2012 [facility] Patient Information 2015 [place], [place]. This information is not intended to replace advice given to you by your health care provider. Make sure you discuss any questions you have with your health care provider.

## 2013-10-25 NOTE — Progress Notes (Signed)
Patient ID: Paul Ryan, male   DOB: 1945-04-18, 68 y.o.   MRN: [id number] Paul Ryan [id number] 1945/06/17 10/25/2013      Progress Note-Follow Up  Subjective  Chief Complaint  Chief Complaint  Patient presents with  . Follow-up    5 month  . Injections    prevnar and flu    HPI  Patient is a 68 year old male in today for routine medical care. He is doing well. Has just returned from a trip to [place]. No recent illness. Denies CP/palp/SOB/HA/congestion/fevers/GI or GU c/o. Taking meds as prescribed  Past Medical History  Diagnosis Date  . DVT (deep venous thrombosis)     takes coumadin  . Shingles 1982    right abdominal wall  . Allergy     seasonal  . Hyperlipidemia   . Left hip pain 12/17/2010  . Preventative health care 12/17/2010  . 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 Hypothyroid 06/10/2011  . Vitamin D deficiency 10/14/2011  . Dysuria 02/27/2013  . Chronic anticoagulation 06/14/2013    Past Surgical History  Procedure Laterality Date  . 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 Cancer Mother     unknown- everywhere  . Cancer Sister     breast    History   Social History  . Marital Status: Married    Spouse Name: N/A    Number of Children: N/A  . Years of Education: N/A   Occupational History  . Not on file.   Social History Main Topics  . Smoking status: Former Smoker -- 1.00 packs/day for 20 years    Types: Cigarettes    Quit date: 03/03/1980  . Smokeless tobacco: Never Used  . Alcohol Use: No  . Drug Use: No  . Sexual Activity: Not on file   Other Topics Concern  . Not on file   Social History Narrative  . No narrative on file    Current Outpatient Prescriptions on File Prior to Visit  Medication Sig Dispense Refill  . Ascorbic Acid (VITAMIN C) 1000 MG tablet Take 1,000 mg by mouth daily.        [name] Kitchen b complex vitamins tablet Take 1 tablet  by mouth daily.        . Cholecalciferol (VITAMIN D3 SUPER STRENGTH) 2000 UNITS TABS Take 2 tablets by mouth daily.        . Coenzyme Q10 (CO Q10) 100 MG CAPS Take 2 capsules by mouth daily.        . CRESTOR 20 MG tablet TAKE ONE TABLET BY MOUTH ONE TIME DAILY   60 tablet  0  . Krill Oil 300 MG CAPS 1 cap daily      . Rivaroxaban (XARELTO) 20 MG TABS tablet Take 1 tablet (20 mg total) by mouth daily with supper.  90 tablet  1  . tacrolimus (PROTOPIC) 0.1 % ointment Apply topically 2 (two) times daily.  100 g  1  . thyroid (ARMOUR THYROID) 60 MG tablet Take 1 tablet (60 mg total) by mouth daily before breakfast.  45 tablet  1  . thyroid (ARMOUR THYROID) 90 MG tablet Alternate this with Armour 60 mg  45 tablet  1   No current facility-administered medications on file prior to visit.    No Known Allergies  Review of Systems  Review of Systems  Constitutional: Negative for fever and  malaise/fatigue.  HENT: Negative for congestion.   Eyes: Negative for discharge.  Respiratory: Negative for shortness of breath.   Cardiovascular: Negative for chest pain, palpitations and leg swelling.  Gastrointestinal: Negative for nausea, abdominal pain and diarrhea.  Genitourinary: Negative for dysuria.  Musculoskeletal: Negative for falls.  Skin: Negative for rash.  Neurological: Negative for loss of consciousness and headaches.  Endo/Heme/Allergies: Negative for polydipsia.  Psychiatric/Behavioral: Negative for depression and suicidal ideas. The patient is not nervous/anxious and does not have insomnia.     Objective  BP 110/62  Pulse 59  Temp(Src) 97.8 F (36.6 C) (Oral)  Ht 5\' 11"  (1.803 m)  Wt 156 lb 1.9 oz (70.816 kg)  BMI 21.78 kg/m2  SpO2 96%  Physical Exam  Physical Exam  Constitutional: He is oriented to person, place, and time and well-developed, well-nourished, and in no distress. No distress.  HENT:  Head: Normocephalic and atraumatic.  Eyes: Conjunctivae are normal.  Neck:  Neck supple. No thyromegaly present.  Cardiovascular: Normal rate, regular rhythm and normal heart sounds.   No murmur heard. Pulmonary/Chest: Effort normal and breath sounds normal. No respiratory distress.  Abdominal: He exhibits no distension and no mass. There is no tenderness.  Musculoskeletal: He exhibits no edema.  Neurological: He is alert and oriented to person, place, and time.  Skin: Skin is warm.  Psychiatric: Memory, affect and judgment normal.    Lab Results  Component Value Date   TSH 1.229 10/10/2013   Lab Results  Component Value Date   WBC 6.1 10/10/2013   HGB 15.8 10/10/2013   HCT 45.8 10/10/2013   MCV 90.2 10/10/2013   PLT 202 10/10/2013   Lab Results  Component Value Date   CREATININE 1.00 10/10/2013   BUN 13 10/10/2013   NA 141 10/10/2013   K 5.3 10/10/2013   CL 106 10/10/2013   CO2 27 10/10/2013   Lab Results  Component Value Date   ALT 59* 10/10/2013   AST 36 10/10/2013   ALKPHOS 57 10/10/2013   BILITOT 0.7 10/10/2013   Lab Results  Component Value Date   CHOL 201* 10/10/2013   Lab Results  Component Value Date   HDL 50 10/10/2013   Lab Results  Component Value Date   LDLCALC 132* 10/10/2013   Lab Results  Component Value Date   TRIG 96 10/10/2013   Lab Results  Component Value Date   CHOLHDL 4.0 10/10/2013     Assessment & Plan  Hyperlipidemia Increase Crestor by 1 tab a week. Tolerating statin, encouraged heart healthy diet, avoid trans fats, minimize simple carbs and saturated fats. Increase exercise as tolerated  Hypothyroid On Levothyroxine, continue to monitor  Hereditary protein S deficiency Tolerating Xarelto, no changes

## 2013-10-30 ENCOUNTER — Encounter: Payer: Self-pay | Admitting: Family Medicine

## 2013-10-30 NOTE — Assessment & Plan Note (Signed)
On Levothyroxine, continue to monitor 

## 2013-10-30 NOTE — Assessment & Plan Note (Signed)
Increase Crestor by 1 tab a week. Tolerating statin, encouraged heart healthy diet, avoid trans fats, minimize simple carbs and saturated fats. Increase exercise as tolerated

## 2013-10-30 NOTE — Assessment & Plan Note (Signed)
Tolerating Xarelto, no changes 

## 2013-11-15 ENCOUNTER — Other Ambulatory Visit: Payer: BC Managed Care – PPO

## 2013-11-15 ENCOUNTER — Ambulatory Visit: Payer: BC Managed Care – PPO | Admitting: Oncology

## 2013-11-22 DIAGNOSIS — H11159 Pinguecula, unspecified eye: Secondary | ICD-10-CM | POA: Diagnosis not present

## 2013-11-22 DIAGNOSIS — H2510 Age-related nuclear cataract, unspecified eye: Secondary | ICD-10-CM | POA: Diagnosis not present

## 2013-11-22 DIAGNOSIS — H43819 Vitreous degeneration, unspecified eye: Secondary | ICD-10-CM | POA: Diagnosis not present

## 2013-12-02 ENCOUNTER — Encounter: Payer: Self-pay | Admitting: Family Medicine

## 2013-12-10 ENCOUNTER — Encounter: Payer: Self-pay | Admitting: Family Medicine

## 2013-12-27 ENCOUNTER — Ambulatory Visit (INDEPENDENT_AMBULATORY_CARE_PROVIDER_SITE_OTHER): Payer: Medicare Other | Admitting: Family Medicine

## 2013-12-27 ENCOUNTER — Encounter: Payer: Self-pay | Admitting: Family Medicine

## 2013-12-27 ENCOUNTER — Other Ambulatory Visit (INDEPENDENT_AMBULATORY_CARE_PROVIDER_SITE_OTHER): Payer: Medicare Other

## 2013-12-27 VITALS — BP 104/76 | HR 57 | Ht 71.0 in | Wt 157.0 lb

## 2013-12-27 DIAGNOSIS — M25551 Pain in right hip: Secondary | ICD-10-CM

## 2013-12-27 DIAGNOSIS — M7061 Trochanteric bursitis, right hip: Secondary | ICD-10-CM | POA: Diagnosis not present

## 2013-12-27 NOTE — Patient Instructions (Signed)
Good to see you New exercises 3 times a week, alternate the shoulder and the hip Ice 20 minutes 2 times daily. Usually after activity and before bed. Continue the over the counter vitamins Never have your hands to come out your peripheral vision.  See me again in 3 weeks.

## 2013-12-27 NOTE — Progress Notes (Signed)
[name] [place], [place] [address] Phone: [phone] Subjective:     CC: Right hip pain follow up  [id number] Paul Ryan is a 68 y.o. male coming in with complaint of right hip pain. Patient was seen previously and did have a calcific changes of the greater trochanteric bursa. Patient did have a steroid injection and states that he had multiple months of being pain-free. Patient was last seen in had the injection in March 2015. Patient states over the course last several weeks he started having increasing pain again. Patient does walk a lot and he states after walking and sitting a long amount of time he is severe pain on the lateral aspect of the hip. Denies any radiation down the leg or any numbness. Patient denies any weakness either, can wake him up at nighttime.     Past medical history, social, surgical and family history all reviewed in electronic medical record.   Review of Systems: No headache, visual changes, nausea, vomiting, diarrhea, constipation, dizziness, abdominal pain, skin rash, fevers, chills, night sweats, weight loss, swollen lymph nodes, body aches, joint swelling, muscle aches, chest pain, shortness of breath, mood changes.   Objective Blood pressure 104/76, pulse 57, height 5\' 11"  (1.803 m), weight 157 lb (71.215 kg), SpO2 96.00%.  General: No apparent distress alert and oriented x3 mood and affect normal, dressed appropriately.  HEENT: Pupils equal, extraocular movements intact  Respiratory: Patient's speak in full sentences and does not appear short of breath  Cardiovascular: No lower extremity edema, non tender, no erythema  Skin: Warm dry intact with no signs of infection or rash on extremities or on axial skeleton.  Abdomen: Soft nontender  Neuro: Cranial nerves II through XII are intact, neurovascularly intact in all extremities with 2+ DTRs and 2+ pulses.  Lymph: No lymphadenopathy of posterior or anterior  cervical chain or axillae bilaterally.  Gait normal with good balance and coordination.  MSK:  Non tender with full range of motion and good stability and symmetric strength and tone of shoulders, elbows, wrist,  knee and ankles bilaterally.  Hip: Right ROM IR: 35 Deg, ER: 45 Deg, Flexion: 120 Deg, Extension: 100 Deg, Abduction: 45 Deg, Adduction: 45 Deg Strength IR: 5/5, ER: 5/5, Flexion: 5/5, Extension: 5/5, Abduction: 3/5, Adduction: 5/5 Pelvic alignment unremarkable to inspection and palpation. Standing hip rotation and gait without trendelenburg sign / unsteadiness. Greater trochanter with tenderness posterior No tenderness over piriformis . No pain with FABER or FADIR. No SI joint tenderness and normal minimal SI movement. Contralateral hip unremarkable  MSK US performed of: Right This study was ordered, performed, and interpreted by Charlann Boxer D.O.  Hip: Trochanteric bursa with swelling. Patient's posterior greater trochanteric bursa though has the significant amount of swelling but less calcific changes as stated from previous imaging Acetabular labrum visualized and without tears, displacement, or effusion in joint. Femoral neck appears unremarkable without increased power doppler signal along Cortex.  IMPRESSION:  Decrease in calcific changes continued bursitis   Procedure: Real-time Ultrasound Guided Injection of right greater trochanteric bursitis secondary to patient's body habitus Device: GE Logiq E  Ultrasound guided injection is preferred based studies that show increased duration, increased effect, greater accuracy, decreased procedural pain, increased response rate, and decreased cost with ultrasound guided versus blind injection.  Verbal informed consent obtained.  Time-out conducted.  Noted no overlying erythema, induration, or other signs of local infection.  Skin prepped in a sterile fashion.  Local anesthesia:  Topical Ethyl chloride.  With sterile technique  and under real time ultrasound guidance:  Greater trochanteric area was visualized and patient's bursa was noted. A 22-gauge 3 inch needle was inserted and 4 cc of 0.5% Marcaine and 1 cc of Kenalog 40 mg/dL was injected. Pictures taken Completed without difficulty  Pain immediately resolved suggesting accurate placement of the medication.  Advised to call if fevers/chills, erythema, induration, drainage, or persistent bleeding.  Images permanently stored and available for review in the ultrasound unit.  Impression: Technically successful ultrasound guided injection.    Impression and Recommendations:     This case required medical decision making of moderate complexity.

## 2013-12-27 NOTE — Assessment & Plan Note (Addendum)
Patient was given another injection today. We discussed about 3 being the total amount in a calendar year. Patient encouraged to do the home exercises on a more regular basis and was given a trial of some topical anti-inflammatories but will avoid oral anti-inflammatories secondary to patient's chronic anticoagulation. We discussed other natural medications he can be beneficial as well. Encourage patient to do the exercises on a regular basis. Patient declined formal physical therapy. Patient will do and icing protocol. Patient and will come back and see me again in 3 weeks for further evaluation.  Spent greater than 25 minutes with patient face-to-face and had greater than 50% of counseling including as described above in assessment and plan.

## 2013-12-29 ENCOUNTER — Other Ambulatory Visit: Payer: Self-pay

## 2013-12-29 DIAGNOSIS — E039 Hypothyroidism, unspecified: Secondary | ICD-10-CM

## 2013-12-29 MED ORDER — THYROID 60 MG PO TABS: 60.0000 mg | ORAL_TABLET | Freq: Every day | ORAL | Status: DC

## 2013-12-29 MED ORDER — THYROID 90 MG PO TABS
ORAL_TABLET | ORAL | Status: DC
Start: 2013-12-29 — End: 2014-03-27

## 2014-01-01 ENCOUNTER — Encounter: Payer: Self-pay | Admitting: Family Medicine

## 2014-01-02 MED ORDER — RIVAROXABAN 20 MG PO TABS: 20.0000 mg | ORAL_TABLET | Freq: Every day | ORAL | Status: DC

## 2014-01-17 ENCOUNTER — Ambulatory Visit: Payer: Medicare Other | Admitting: Family Medicine

## 2014-02-21 ENCOUNTER — Encounter: Payer: Self-pay | Admitting: Family Medicine

## 2014-02-21 DIAGNOSIS — Z0000 Encounter for general adult medical examination without abnormal findings: Secondary | ICD-10-CM

## 2014-02-21 DIAGNOSIS — E039 Hypothyroidism, unspecified: Secondary | ICD-10-CM

## 2014-02-21 DIAGNOSIS — E785 Hyperlipidemia, unspecified: Secondary | ICD-10-CM

## 2014-03-01 ENCOUNTER — Ambulatory Visit (INDEPENDENT_AMBULATORY_CARE_PROVIDER_SITE_OTHER): Payer: Medicare Other | Admitting: Family Medicine

## 2014-03-01 ENCOUNTER — Encounter: Payer: Self-pay | Admitting: Family Medicine

## 2014-03-01 ENCOUNTER — Ambulatory Visit (INDEPENDENT_AMBULATORY_CARE_PROVIDER_SITE_OTHER)
Admission: RE | Admit: 2014-03-01 | Discharge: 2014-03-01 | Disposition: A | Discharge status: Home / Self Care | Payer: Medicare Other | Source: Ambulatory Visit | Attending: Family Medicine | Admitting: Family Medicine

## 2014-03-01 VITALS — BP 98/68 | HR 66 | Ht 71.0 in | Wt 161.0 lb

## 2014-03-01 DIAGNOSIS — M25551 Pain in right hip: Secondary | ICD-10-CM

## 2014-03-01 MED ORDER — TRAMADOL HCL 50 MG PO TABS: 50.0000 mg | ORAL_TABLET | Freq: Two times a day (BID) | ORAL | Status: DC | PRN

## 2014-03-01 NOTE — Assessment & Plan Note (Addendum)
I believe the patient's right hip pain is likely more secondary to hip arthritis. X-rays ordered today. Patient will be sent to formal physical therapy, we discussed home exercises, we discussed over-the-counter medication and tramadol prescription written for. Patient elected to.have a intra-articular injection today. Patient will come back in 2 weeks and if continuing to have difficulty will consider other treatment options. Patient's hip arthritis is severe hip replacement may be necessary. Differential also includes lumbar radiculopathy and we will wait for x-rays to further evaluate.  Spent greater than 25 minutes with patient face-to-face and had greater than 50% of counseling including as described above in assessment and plan.

## 2014-03-01 NOTE — Progress Notes (Signed)
  [name] [place], [place] [address] Phone: [phone] Subjective:     CC: Right hip pain follow up  [id number] Paul Ryan is a 68 y.o. male coming in with complaint of right hip pain. Patient was seen previously and did have a calcific changes of the greater trochanteric bursa. Patient did have a steroid injection and states that he had multiple months of being pain-free. Patient had an injection 2 months ago but states that this time and did not seem to be very beneficial. Patient states that he continues to have discomfort on the lateral aspect of his hip pain is worse with significant amount walking. Patient is also notices some more groin pain which is new. Denies any radiation down the leg but states that it does have almost a burning sensation from time to time. Patient is tried some of the over-the-counter medicines but not very regularly and has not been doing the home exercises regularly. [place] the [place] 3 [place] and continued to have worsening pain this is starting to stop him from some daily activities.      Past medical history, social, surgical and family history all reviewed in electronic medical record.   Review of Systems: No headache, visual changes, nausea, vomiting, diarrhea, constipation, dizziness, abdominal pain, skin rash, fevers, chills, night sweats, weight loss, swollen lymph nodes, body aches, joint swelling, muscle aches, chest pain, shortness of breath, mood changes.   Objective Blood pressure 98/68, pulse 66, height 5\' 11"  (1.803 m), weight 161 lb (73.029 kg), SpO2 99 %.  General: No apparent distress alert and oriented x3 mood and affect normal, dressed appropriately.  HEENT: Pupils equal, extraocular movements intact  Respiratory: Patient's speak in full sentences and does not appear short of breath  Cardiovascular: No lower extremity edema, non tender, no erythema  Skin: Warm dry  intact with no signs of infection or rash on extremities or on axial skeleton.  Abdomen: Soft nontender  Neuro: Cranial nerves II through XII are intact, neurovascularly intact in all extremities with 2+ DTRs and 2+ pulses.  Lymph: No lymphadenopathy of posterior or anterior cervical chain or axillae bilaterally.  Gait normal with good balance and coordination.  MSK:  Non tender with full range of motion and good stability and symmetric strength and tone of shoulders, elbows, wrist,  knee and ankles bilaterally.  Hip: Right ROM IR: 25 Deg, ER: 45 Deg, Flexion: 120 Deg, Extension: 100 Deg, Abduction: 45 Deg, Adduction: 45 Deg Strength IR: 5/5, ER: 5/5, Flexion: 5/5, Extension: 5/5, Abduction: 3/5, Adduction: 5/5 Pelvic alignment unremarkable to inspection and palpation. Standing hip rotation and gait without trendelenburg sign / unsteadiness. Greater trochanter with tenderness posterior No tenderness over piriformis . Severe pain with FABER and FADIR. No SI joint tenderness and normal minimal SI movement. Contralateral hip unremarkable   back exam shows some mild right-sided paraspinal musculature tenderness. Negative straight leg test.     Impression and Recommendations:     This case required medical decision making of moderate complexity.

## 2014-03-01 NOTE — Patient Instructions (Addendum)
Good to see you Tylenol 650 mg 3 times a day Tramadol 50mg  up to 2 times a day.  Physical therapy will be calling you.  Ice after activity When walkking try to walk on softer surfaces Get xrays downstairs today.  Turmeric 500mg  daily.  Glucosamine 1500mg  daily.  Saffron could help.  See me again in 2 weeks and we can consider an injection.

## 2014-03-02 ENCOUNTER — Ambulatory Visit: Payer: BC Managed Care – PPO | Admitting: Family Medicine

## 2014-03-08 ENCOUNTER — Telehealth: Payer: Self-pay | Admitting: *Deleted

## 2014-03-08 ENCOUNTER — Ambulatory Visit: Payer: Medicare Other | Attending: Family Medicine

## 2014-03-08 DIAGNOSIS — R29898 Other symptoms and signs involving the musculoskeletal system: Secondary | ICD-10-CM | POA: Insufficient documentation

## 2014-03-08 DIAGNOSIS — M25551 Pain in right hip: Secondary | ICD-10-CM | POA: Insufficient documentation

## 2014-03-08 DIAGNOSIS — M25651 Stiffness of right hip, not elsewhere classified: Secondary | ICD-10-CM

## 2014-03-08 NOTE — Therapy (Signed)
[place], [place], [address] Phone: [phone]   Fax:  [phone]  Physical Therapy Evaluation  Patient Details  Name: Paul Ryan MRN: [id number] Date of Birth: 28-Dec-1945  Encounter Date: 03/08/2014      PT End of Session - 03/08/14 1057    Visit Number 1   Number of Visits 12   Date for PT Re-Evaluation 04/20/14   PT Start Time 9242   PT Stop Time 1100   PT Time Calculation (min) 45 min   Activity Tolerance Patient tolerated treatment well   Behavior During Therapy [facility] for tasks assessed/performed      Past Medical History  Diagnosis Date  . DVT (deep venous thrombosis)     takes coumadin  . Shingles 1982    right abdominal wall  . Allergy     seasonal  . Hyperlipidemia   . Left hip pain 12/17/2010  . Preventative health care 12/17/2010  . 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 Hypothyroid 06/10/2011  . Vitamin D deficiency 10/14/2011  . Dysuria 02/27/2013  . Chronic anticoagulation 06/14/2013    Past Surgical History  Procedure Laterality Date  . 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  Visit Diagnosis:  Hip pain, right - Plan: PT plan of care cert/re-cert  Stiffness of right hip joint - Plan: PT plan of care cert/re-cert  Weakness of right hip - Plan: PT plan of care cert/re-cert      Subjective Assessment - 03/08/14 1020    Symptoms RT hip pain that he report is bursitis. He received Cortisone injection 12/2013 with benefit and received second injection 12/15 without benefit.    Pertinent History No specific injury.     Limitations --  He continues with all activity but with pain   Diagnostic tests xray: negative   Patient Stated Goals Eliminate pain   Currently in Pain? Yes   Pain Score 5    Pain Location Hip   Pain Orientation Right   Pain Descriptors / Indicators Nagging;Dull    Pain Type Chronic pain   Pain Onset More than a month ago   Pain Frequency Constant   Aggravating Factors  Wakes with pain , worse at night   Pain Relieving Factors Use cold occasionally   Multiple Pain Sites No          [facility] PT Assessment - 03/08/14 1025    Assessment   Medical Diagnosis RT hip pain   Onset Date --  6 months ago   Next MD Visit 03/15/14   Prior Therapy No   Precautions   Precautions None   Restrictions   Weight Bearing Restrictions No   Balance Screen   Has the patient fallen in the past 6 months No   Has the patient had a decrease in activity level because of a fear of falling?  No   Is the patient reluctant to leave their home because of a fear of falling?  No   AROM   Right Hip Flexion 120   Right Hip External Rotation  35   Right Hip Internal Rotation  28   Right Hip ABduction 43   Right Hip ADduction 25   Left Hip Flexion 135   Left Hip External Rotation  33   Left Hip Internal Rotation  23   Left Hip ABduction 40  Left Hip ADduction 35   Strength   Overall Strength Within functional limits for tasks performed  except gludeus medius 4/5 LT 4-/5 RT   Ambulation/Gait   Ambulation/Gait --  WNL today                          PT Education - Mar 16, 2014 1040    Education provided Yes   Education Details stretch and strength Rt hip rotation, use of cold   Person(s) Educated Patient   Methods Explanation;Handout   Comprehension Verbalized understanding;Returned demonstration          PT Short Term Goals - 2014-03-16 1100    PT SHORT TERM GOAL #1   Title He will be independent with initial HEP   Time 3   Period Weeks   Status New   PT SHORT TERM GOAL #2   Title He will report pain30% or mrore improved   Time 3   Period Weeks   Status New   PT SHORT TERM GOAL #3   Title Imporve RT hip flexion to 120 degrees passive   Time 3   Period Weeks   Status New           PT Long Term Goals - 03/16/14 1101    PT LONG  TERM GOAL #1   Title He will be independent in HEP issued as of last visit   Time 6   Period Weeks   Status New   PT LONG TERM GOAL #2   Title He will report 75% decr pain with exercise and home tasks   Time 6   Period Weeks   Status New               Plan - 03/16/14 1058    Clinical Impression Statement RT trochanteric hip pain with some mild hamstring and hip flexion/adduction tightness and glut med weakness.    Pt will benefit from skilled therapeutic intervention in order to improve on the following deficits Decreased strength;Decreased range of motion;Pain   Rehab Potential Good   PT Frequency 2x / week   PT Duration 6 weeks   PT Treatment/Interventions Cryotherapy;Ultrasound;Dry needling;Passive range of motion;Manual techniques;Moist Heat;Patient/family education;Therapeutic exercise   PT Next Visit Plan Review HEP and add hamstring stretch, modalities PRN   PT Home Exercise Plan steching and glute strength   Consulted and Agree with Plan of Care Patient          G-Codes - 2014/03/16 1107    Functional Assessment Tool Used clinical judgement   Functional Limitation Mobility: Walking and moving around   Mobility: Walking and Moving Around Current Status [phone]) At least 20 percent but less than 40 percent impaired, limited or restricted   Mobility: Walking and Moving Around Goal Status [phone]) At least 20 percent but less than 40 percent impaired, limited or restricted       Problem List Patient Active Problem List   Diagnosis Date Noted  . Right eye injury 10/10/2013  . Chronic anticoagulation 06/14/2013  . Leukocytosis, unspecified 05/29/2013  . Greater trochanteric bursitis of right hip 05/16/2013  . Hip pain, right 05/01/2013  . Candida infection 02/27/2013  . Vitamin d deficiency 10/14/2011  . Hypothyroid 06/10/2011  . 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 Left hip pain 12/17/2010  . Preventative health care  12/17/2010  . DVT (deep venous thrombosis)   . Allergic state   . Hyperlipidemia   . Shingles  Darrel Hoover PT 03/08/2014, 11:09 AM  [place], [place], [address] Phone: [phone]   Fax:  [phone]    By signing I understand that I am ordering/authorizing the use of Iontophoresis using 4 mg/mL of dexamethasone as a component of this plan of care.

## 2014-03-08 NOTE — Telephone Encounter (Signed)
appts made and printed...td 

## 2014-03-08 NOTE — Patient Instructions (Signed)
Extensors / Rotators, Supine   Lie supine, one leg straight, other leg bent, knee held by opposite hand. Gently pull knee toward opposite shoulder. Feel stretch in buttocks and outside of hip. Hold ___ seconds. Repeat ___ times per session. Do ___ sessions per day.  Copyright  VHI. All rights reserved.  Clam Shell 45 Degrees   Lying with hips and knees bent 45, one pillow between knees and ankles. Lift knee. Be sure pelvis does not roll backward. Do not arch back. Do ___ times, each leg, ___ times per day.  http://ss.exer.us/74   Copyright  VHI. All rights reserved.

## 2014-03-15 ENCOUNTER — Ambulatory Visit: Payer: Medicare Other | Admitting: Family Medicine

## 2014-03-17 ENCOUNTER — Ambulatory Visit: Payer: Medicare Other

## 2014-03-17 DIAGNOSIS — M25651 Stiffness of right hip, not elsewhere classified: Secondary | ICD-10-CM

## 2014-03-17 DIAGNOSIS — M25551 Pain in right hip: Secondary | ICD-10-CM

## 2014-03-17 DIAGNOSIS — R29898 Other symptoms and signs involving the musculoskeletal system: Secondary | ICD-10-CM

## 2014-03-17 NOTE — Patient Instructions (Signed)
Brettzel stretch with deep breathing and rotation back. Issued for HEP and pt agreed and demo understanding. 1-2 x/day 1-2 reps

## 2014-03-17 NOTE — Therapy (Addendum)
[facility] [address] [place], [place], [address] Phone: [phone]   Fax:  [phone]  Physical Therapy Treatment  Patient Details  Name: Paul Ryan MRN: [id number] Date of Birth: [birth year] Referring Provider:  [name], MD  Encounter Date: 03/17/2014      PT End of Session - 03/17/14 0849    Visit Number 2   Number of Visits 12   Date for PT Re-Evaluation 04/20/14   PT Start Time 0800   PT Stop Time 0845   PT Time Calculation (min) 45 min   Activity Tolerance Patient tolerated treatment well   Behavior During Therapy [facility] for tasks assessed/performed      Past Medical History  Diagnosis Date  . DVT (deep venous thrombosis)     takes coumadin  . Shingles 1982    right abdominal wall  . Allergy     seasonal  . Hyperlipidemia   . Left hip pain 12/17/2010  . Preventative health care 12/17/2010  . 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 Hypothyroid 06/10/2011  . Vitamin D deficiency 10/14/2011  . Dysuria 02/27/2013  . Chronic anticoagulation 06/14/2013    Past Surgical History  Procedure Laterality Date  . 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  Visit Diagnosis:  Hip pain, right  Stiffness of right hip joint  Weakness of right hip      Subjective Assessment - 03/17/14 0757    Currently in Pain? Yes   Pain Location --  He reports multiple locations now both hips and both shoulders. Diffcultu getting out of bed general difficulty getting out of bed and getting going in AM   Pain Type Acute pain;Chronic pain   Pain Onset In the past 7 days   Pain Frequency Constant   Aggravating Factors  He reports still able to work out with [place] [employer] and walking but pain seems worse   Pain Relieving Factors Gentle activity, heat   Effect of Pain on Daily Activities not limited except getting going in AM   Multiple Pain  Sites Yes                    [facility] Adult PT Treatment/Exercise - 03/17/14 0809    Lumbar Exercises: Stretches   Active Hamstring Stretch 5 reps  with pull down with red tubing   Passive Hamstring Stretch 2 reps;30 seconds   Lumbar Exercises: Aerobic   Stationary Bike L3 5 min    Knee/Hip Exercises: Stretches   Piriformis Stretch 2 reps;30 seconds   Knee/Hip Exercises: Sidelying   Hip ABduction AROM;1 set  12 reps, RT and LT   Clams 1 set x15 hold 2-3 sec   RT and LT   Other Sidelying Knee Exercises Brettzel stretch RT and LT    Modalities   Modalities Cryotherapy;Ultrasound                PT Education - 03/17/14 0849    Education provided Yes   Education Details [email or web address]   Person(s) Educated Patient   Methods Explanation;Demonstration;Verbal cues;Handout;Tactile cues   Comprehension Verbalized understanding;Returned demonstration          PT Short Term Goals - 03/08/14 1100    PT SHORT TERM GOAL #1   Title He will be independent with initial HEP   Time 3   Period Weeks   Status  New   PT SHORT TERM GOAL #2   Title He will report pain30% or mrore improved   Time 3   Period Weeks   Status New   PT SHORT TERM GOAL #3   Title Imporve RT hip flexion to 120 degrees passive   Time 3   Period Weeks   Status New           PT Long Term Goals - 03/08/14 1101    PT LONG TERM GOAL #1   Title He will be independent in HEP issued as of last visit   Time 6   Period Weeks   Status New   PT LONG TERM GOAL #2   Title He will report 75% decr pain with exercise and home tasks   Time 6   Period Weeks   Status New               Plan - 03/17/14 0849    Clinical Impression Statement Multiple pain areas today. No incr pain after session   Pt will benefit from skilled therapeutic intervention in order to improve on the following deficits Decreased strength;Decreased range of motion;Pain   Rehab Potential Good   PT Frequency 2x / week   PT  Duration --  5 weeks   PT Treatment/Interventions Cryotherapy;Ultrasound;Dry needling;Passive range of motion;Manual techniques;Moist Heat;Patient/family education;Therapeutic exercise   PT Next Visit Plan Review HEP and add hamstring stretch, modalities PRN   PT Home Exercise Plan steching and glute strength   Consulted and Agree with Plan of Care Patient        Problem List Patient Active Problem List   Diagnosis Date Noted  . Right eye injury 10/10/2013  . Chronic anticoagulation 06/14/2013  . Leukocytosis, unspecified 05/29/2013  . Greater trochanteric bursitis of right hip 05/16/2013  . Hip pain, right 05/01/2013  . Candida infection 02/27/2013  . Vitamin d deficiency 10/14/2011  . Hypothyroid 06/10/2011  . 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 Left hip pain 12/17/2010  . Preventative health care 12/17/2010  . DVT (deep venous thrombosis)   . Allergic state   . Hyperlipidemia   . Shingles     Darrel Hoover PT 03/17/2014, 8:51 AM  [facility] [address] [place], [place], [address] Phone: [phone]   Fax:  [phone]  PHYSICAL THERAPY DISCHARGE SUMMARY  Visits from Start of Care: 2  Current functional level related to goals / functional outcomes: Unknown as he did not return after second visit   Remaining deficits: Unknown   Education / Equipment: HEP Plan:                                                    Patient goals were not met. Patient is being discharged due to not returning since the last visit.  ?????

## 2014-03-21 ENCOUNTER — Encounter: Payer: Self-pay | Admitting: Oncology

## 2014-03-21 ENCOUNTER — Other Ambulatory Visit: Payer: Self-pay | Admitting: Oncology

## 2014-03-21 ENCOUNTER — Ambulatory Visit (INDEPENDENT_AMBULATORY_CARE_PROVIDER_SITE_OTHER): Payer: Medicare Other | Admitting: Oncology

## 2014-03-21 VITALS — BP 118/73 | HR 72 | Temp 98.2°F | Ht 71.0 in | Wt 159.0 lb

## 2014-03-21 DIAGNOSIS — Z7901 Long term (current) use of anticoagulants: Secondary | ICD-10-CM

## 2014-03-21 DIAGNOSIS — M353 Polymyalgia rheumatica: Secondary | ICD-10-CM

## 2014-03-21 HISTORY — DX: Polymyalgia rheumatica: M35.3

## 2014-03-21 LAB — CBC WITH DIFFERENTIAL/PLATELET
BASOS PCT: 0 % (ref 0–1)
Basophils Absolute: 0 10*3/uL (ref 0.0–0.1)
Eosinophils Absolute: 0.1 10*3/uL (ref 0.0–0.7)
Eosinophils Relative: 1 % (ref 0–5)
HEMATOCRIT: 44.3 % (ref 39.0–52.0)
HEMOGLOBIN: 15.5 g/dL (ref 13.0–17.0)
Lymphocytes Relative: 16 % (ref 12–46)
Lymphs Abs: 1.5 10*3/uL (ref 0.7–4.0)
MCH: 30.5 pg (ref 26.0–34.0)
MCHC: 35 g/dL (ref 30.0–36.0)
MCV: 87.2 fL (ref 78.0–100.0)
MONO ABS: 0.8 10*3/uL (ref 0.1–1.0)
MPV: 8.9 fL (ref 8.6–12.4)
Monocytes Relative: 9 % (ref 3–12)
NEUTROS ABS: 6.9 10*3/uL (ref 1.7–7.7)
NEUTROS PCT: 74 % (ref 43–77)
PLATELETS: 295 10*3/uL (ref 150–400)
RBC: 5.08 MIL/uL (ref 4.22–5.81)
RDW: 13.2 % (ref 11.5–15.5)
WBC: 9.3 10*3/uL (ref 4.0–10.5)

## 2014-03-21 LAB — COMPREHENSIVE METABOLIC PANEL
ALBUMIN: 4 g/dL (ref 3.5–5.2)
ALT: 20 U/L (ref 0–53)
AST: 18 U/L (ref 0–37)
Alkaline Phosphatase: 59 U/L (ref 39–117)
BILIRUBIN TOTAL: 0.9 mg/dL (ref 0.2–1.2)
BUN: 11 mg/dL (ref 6–23)
CHLORIDE: 102 meq/L (ref 96–112)
CO2: 27 mEq/L (ref 19–32)
CREATININE: 0.9 mg/dL (ref 0.50–1.35)
Calcium: 9.5 mg/dL (ref 8.4–10.5)
Glucose, Bld: 89 mg/dL (ref 70–99)
Potassium: 4.5 mEq/L (ref 3.5–5.3)
Sodium: 137 mEq/L (ref 135–145)
TOTAL PROTEIN: 7.7 g/dL (ref 6.0–8.3)

## 2014-03-21 LAB — RHEUMATOID FACTOR: Rheumatoid fact SerPl-aCnc: <10 IU/mL (ref <=14)

## 2014-03-21 LAB — CK: Total CK: 134 U/L (ref 7–232)

## 2014-03-21 NOTE — Patient Instructions (Signed)
To lab today Return 1 week to check coumadin level  Return visit with Dr Darnell Level 3-4 weeks to review results

## 2014-03-22 LAB — ALDOLASE

## 2014-03-22 LAB — SEDIMENTATION RATE: SED RATE: 84 mm/h — AB (ref 0–16)

## 2014-03-22 NOTE — Progress Notes (Signed)
Patient ID: Paul Ryan, male   DOB: [birth year], 69 y.o.   MRN: [id number] Hematology and Oncology Follow Up Visit  Paul Ryan [id number] Apr 19, 1945 69 y.o. 03/22/2014 4:49 PM   Principle Diagnosis: Encounter Diagnoses  Name Primary?  . Polymyalgia   . 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 Over the last 2-3 weeks C is experiencing generalized malaise and diffuse myalgias without any definite arthralgias except the symptoms seemed  to begin with a right trochanteric bursitis in March, 2015 with transient improvement after a steroid injection.. No fevers. No new medications. He is on a statin but has been on this for many years without any side effects. He frequently travels to [place] but does not remember contracting and the illness when he was there    Medications: reviewed  Allergies: No Known Allergies  Review of Systems: Hematology:  No bleeding or bruising ENT ROS: No sore throat Breast ROS:  Respiratory ROS: No cough or dyspnea Cardiovascular ROS:  No chest pain or palpitations Gastrointestinal ROS: No nausea vomiting abdominal pain or change in bowel habit   Genito-Urinary ROS: No urinary tract symptoms Musculoskeletal ROS: See above Neurological ROS: No headache or change in vision Dermatological ROS: No rash or ecchymosis Remaining ROS negative:   Physical Exam: Blood pressure 118/73, pulse 72, temperature 98.2 F (36.8 C), temperature source Oral, height 5\' 11"  (1.803 m), weight 159 lb (72.122 kg), SpO2 98 %. Wt Readings from Last 3 Encounters:  03/21/14 159 lb (72.122 kg)  03/01/14 161 lb (73.029 kg)  12/27/13 157 lb (71.215 kg)     General appearance: Thin Asian man HENNT: Pharynx no erythema, exudate, mass, or ulcer. No thyromegaly or thyroid  nodules Lymph nodes: No cervical, supraclavicular, or axillary lymphadenopathy Breasts: Lungs: Clear to auscultation, resonant to percussion throughout Heart: Regular rhythm, no murmur, no gallop, no rub, no click, no edema Abdomen: Soft, nontender, normal bowel sounds, no mass, no organomegaly Extremities: No edema, no calf tenderness Musculoskeletal: no joint deformities or swelling. No muscle soreness. Status post remote amputation second finger right hand GU:  Vascular: Carotid pulses 2+, no bruits,  Neurologic: Alert, oriented, PERRLA,, cranial nerves grossly normal, motor strength 5 over 5, reflexes 1+ symmetric, upper body coordination normal, gait normal, Skin: No rash or ecchymosis  Lab Results: CBC W/Diff    Component Value Date/Time   WBC 9.3 03/21/2014 1521   WBC 10.5* 05/17/2013 0902   RBC 5.08 03/21/2014 1521   RBC 4.99 05/17/2013 0902   HGB 15.5 03/21/2014 1521   HGB 15.2 05/17/2013 0902   HCT 44.3 03/21/2014 1521   HCT 44.6 05/17/2013 0902   PLT 295 03/21/2014 1521   PLT 181 05/17/2013 0902   MCV 87.2 03/21/2014 1521   MCV 89.4 05/17/2013 0902   MCH 30.5 03/21/2014 1521   MCH 30.5 05/17/2013 0902   MCHC 35.0 03/21/2014 1521   MCHC 34.1 05/17/2013 0902   RDW 13.2 03/21/2014 1521   RDW 13.7 05/17/2013 0902   LYMPHSABS 1.5 03/21/2014 1521   LYMPHSABS 1.7 05/17/2013 0902   MONOABS 0.8 03/21/2014 1521   MONOABS 0.4 05/17/2013 0902   EOSABS 0.1 03/21/2014 1521   EOSABS 0.0 05/17/2013 0902   BASOSABS 0.0 03/21/2014 1521   BASOSABS 0.0 05/17/2013 0902     Chemistry  Component Value Date/Time   NA 137 03/21/2014 1521   NA 143 11/16/2012 1145   K 4.5 03/21/2014 1521   K 4.4 11/16/2012 1145   CL 102 03/21/2014 1521   CO2 27 03/21/2014 1521   CO2 27 11/16/2012 1145   BUN 11 03/21/2014 1521   BUN 16.2 11/16/2012 1145   CREATININE 0.90 03/21/2014 1521   CREATININE 1.2 03/11/2013 0905   CREATININE 1.1 11/16/2012 1145      Component Value Date/Time    CALCIUM 9.5 03/21/2014 1521   CALCIUM 9.3 11/16/2012 1145   ALKPHOS 59 03/21/2014 1521   ALKPHOS 48 11/16/2012 1145   AST 18 03/21/2014 1521   AST 23 11/16/2012 1145   ALT 20 03/21/2014 1521   ALT 40 11/16/2012 1145   BILITOT 0.9 03/21/2014 1521   BILITOT 0.75 11/16/2012 1145       Radiological Studies: Dg Hip Complete Right  03/01/2014   CLINICAL DATA:  Chronic right hip and groin pain for the past year without known injury  EXAM: RIGHT HIP - COMPLETE 2+ VIEW  COMPARISON:  None.  FINDINGS: The bony pelvis is adequately mineralized. There is no lytic or blastic lesion. There are degenerative changes of the lower lumbar spine.  AP and lateral views of the right hip reveal the joint space to be preserved. The articular surfaces of the femoral head and acetabulum are smoothly rounded. There is no lytic or blastic lesion nor significant osteophyte formation. The soft tissues are unremarkable.  IMPRESSION: There is no acute or significant chronic bony abnormality of the right hip.   Electronically Signed   By: David  Martinique   On: 03/01/2014 08:48    Impression:  Sudden onset of idiopathic polymyalgia with no focal findings on exam  He has been on Xarelto for about 8 months now. I looked this drug up for another patient recently and there is about a 2% incidence of musculoskeletal complaints. I'm advising him to stop the Xarelto. We will put him back on his previous therapeutic dose of Coumadin 2.5 mg Monday Wednesday Friday, 5 mg other days of the week. I am going to check a chemistry profile, CBC, CK, aldolase, and C-reactive protein. I will call him with results. The Xarelto should get out of his system pretty quickly so we should know within a week or 2 whether his symptoms are medication related. We will have him come back to the clinic in one week to check a PT/INR back on Coumadin.   CC: Patient Care Team: [name], MD as PCP - General (Family Medicine)   Annia Belt,  MD 1/20/20164:49 PM

## 2014-03-26 ENCOUNTER — Encounter: Payer: Self-pay | Admitting: Family Medicine

## 2014-03-27 ENCOUNTER — Other Ambulatory Visit: Payer: Self-pay | Admitting: Family Medicine

## 2014-03-27 LAB — OTHER SOLSTAS TEST

## 2014-03-27 MED ORDER — THYROID 90 MG PO TABS: ORAL_TABLET | ORAL | Status: DC

## 2014-03-27 NOTE — Telephone Encounter (Signed)
Please advise.//AB/CMA 

## 2014-03-28 ENCOUNTER — Other Ambulatory Visit (INDEPENDENT_AMBULATORY_CARE_PROVIDER_SITE_OTHER): Payer: Medicare Other

## 2014-03-28 DIAGNOSIS — Z7901 Long term (current) use of anticoagulants: Secondary | ICD-10-CM

## 2014-03-28 LAB — PROTIME-INR
INR: 2.37 — ABNORMAL HIGH (ref 0.00–1.49)
PROTHROMBIN TIME: 26.1 s — AB (ref 11.6–15.2)

## 2014-03-28 NOTE — Addendum Note (Signed)
Addended by: Truddie Crumble on: 03/28/2014 10:18 AM   Modules accepted: Orders

## 2014-03-29 ENCOUNTER — Encounter: Payer: Self-pay | Admitting: Oncology

## 2014-03-29 ENCOUNTER — Ambulatory Visit (INDEPENDENT_AMBULATORY_CARE_PROVIDER_SITE_OTHER): Payer: Medicare Other | Admitting: Family Medicine

## 2014-03-29 ENCOUNTER — Encounter: Payer: Self-pay | Admitting: Family Medicine

## 2014-03-29 ENCOUNTER — Other Ambulatory Visit (INDEPENDENT_AMBULATORY_CARE_PROVIDER_SITE_OTHER): Payer: Medicare Other

## 2014-03-29 VITALS — BP 108/82 | HR 66 | Ht 71.0 in | Wt 160.0 lb

## 2014-03-29 DIAGNOSIS — M1990 Unspecified osteoarthritis, unspecified site: Secondary | ICD-10-CM

## 2014-03-29 DIAGNOSIS — M353 Polymyalgia rheumatica: Secondary | ICD-10-CM

## 2014-03-29 LAB — URINALYSIS
Bilirubin Urine: NEGATIVE
HGB URINE DIPSTICK: NEGATIVE
Ketones, ur: NEGATIVE
Leukocytes, UA: NEGATIVE
NITRITE: NEGATIVE
Specific Gravity, Urine: <=1.005 — AB (ref 1.000–1.030)
Total Protein, Urine: NEGATIVE
Urine Glucose: 100 — AB
Urobilinogen, UA: 0.2 (ref 0.0–1.0)
pH: 7 (ref 5.0–8.0)

## 2014-03-29 LAB — C-REACTIVE PROTEIN: CRP: 9.4 mg/dL (ref 0.5–20.0)

## 2014-03-29 NOTE — Patient Instructions (Signed)
We will get labs today and see what is going on.  We will check your urine today as well.  I will release the records when I get them We will either treat with prednisone or antibiotics  We will see Polymyalgia Rheumatica Polymyalgia rheumatica (also called PMR or polymyalgia) is a rheumatologic (arthritic) condition that causes pain and morning stiffness in your neck, shoulders, and hips. It is an inflammatory condition. In some people, inflammation of certain structures in the shoulder, hips, or other joints can be seen on special testing. It does not cause joint destruction, as occurs in other arthritic conditions. It usually occurs after 70 years of age, and is more common as you age. It can be confused with several other diseases, but it is usually easily treated. People with PMR often have, or can develop, a more severe rheumatologic condition called giant cell arteritis (also called CGA or temporal arteritis).  CAUSES  The exact cause of PMR is not known.   There are genetic factors involved.  Viruses have been suspected in the cause of PMR. This has not been proven. SYMPTOMS   Aching, pain, and morning stiffness your neck, both shoulders, or both hips.  Symptoms usually start slowly and build gradually.  Morning stiffness usually lasts at least 30 minutes.  Swelling and tenderness in other joints of the arms, hands, legs, and feet may occur.  Swelling and inflammation in the wrists can cause nerve inflammation at the wrist (carpal tunnel syndrome).  You may also have low grade fever, fatigue, weakness, decreased appetite and weight loss. DIAGNOSIS   Your caregiver may suspect that you have PMR based on your description of your symptoms and on your exam.  Your caregiver will examine you to be sure you do not have diseases that can be confused with PMR. These diseases include rheumatoid arthritis, fibromyalgia, or thyroid disease.  Your caregiver should check for signs of giant  cell arteritis. This can cause serious complications such as blindness.  Lab tests can help confirm that you have PMR and not other diseases, but are sometimes inconclusive.  X-rays cannot show PMR. However, it can identify other diseases like rheumatoid arthritis. Your caregiver may have you see a specialist in arthritis and inflammatory diseases (rheumatologist). TREATMENT  The goal of treatment is relief of symptoms. Treatment does not shorten the course of the illness or prevent complications. With proper treatment, you usually feel better almost right away.   The initial treatment of PMR is usually a cortisone (steroid) medication. Your caregiver will help determine a starting dose. The dose is gradually reduced every few weeks to months. Treatment usually lasts one to three years.  Other stronger medications are rarely needed. They will only be prescribed if your symptoms do not get better on cortisone medication alone, or if they recur as the dose is reduced.  Cortisone medication can have different side effects. With the doses of cortisone needed for PMR, the side effects can affect bones and joints, blood sugar control in diabetes, and mood changes. Discuss this with your caregiver.  Your caregiver will evaluate you regularly during your treatment. They will do this in order to assess progress and to check for complications of the illness or treatment.  Physical therapy is sometimes useful. This is especially true if your joints are still stiff after other symptoms have improved. HOME CARE INSTRUCTIONS   Follow your caregiver's instructions. Do not change your dose of cortisone medication on your own.  Keep your appointments  for follow-up lab tests and caregiver visits. Your lab tests need to be monitored. You must get checked periodically for giant cell arteritis.  Follow your caregiver's guidance regarding physical activity (usually no restrictions are needed) or physical  therapy.  Your caregiver may have instructions to prevent or check for side effects from cortisone medication (including bone density testing or treatment). Follow their instructions carefully. SEEK MEDICAL CARE IF:   You develop any side effects from treatment. Side effects can include:  Elevated blood pressure.  High blood sugar (or worsening of diabetes, if you are diabetic).  Difficulty fighting off infections.  Weight gain.  Weakness of the bones (osteoporosis).  Your aches, pains, morning stiffness, or other symptoms get worse with time. This is especially true after your dose of cortisone is reduced.  You develop new joint symptoms (pain, swelling, etc.) SEEK IMMEDIATE MEDICAL CARE IF:   You develop a severe headache.  You start vomiting.  You have problems with your vision.  You have an oral temperature above 102 F (38.9 C), not controlled by medicine. Document Released: 03/27/2004 Document Revised: 02/04/2012 Document Reviewed: 07/10/2008 [facility] Patient Information 2015 [place], [place]. This information is not intended to replace advice given to you by your health care provider. Make sure you discuss any questions you have with your health care provider.

## 2014-03-29 NOTE — Progress Notes (Signed)
[name] [address] [place], [place] [address] Phone: [phone] Subjective:     CC: Right hip pain follow up  JJO:[id number] Paul Ryan is a 69 y.o. male coming in with complaint of right hip pain. Patient was seen previously and did have a calcific changes of the greater trochanteric bursa. Patient did have a steroid injection and states that he had multiple months of being pain-free. Patient had an injection 2 months ago but states that this time and did not seem to be very beneficial. Patient was found to have decreased range of motion of the hip. There was concern the patient had more of a hip arthritis. X-rays ordered by me and did not show any significant bony abnormality. Patient was going to continue the tramadol, Tylenol, as well as going to physical therapy. Patient states recently though patient is a actually had a significant decrease in energy and more pain in all his joints. Patient states that he has more of a dull throbbing aching sensation in both hips as well as shoulders. Patient states intermittently his ankles and hands can be very painful as well. Patient did see his hematologist recently and some labs were ordered. Only unremarkable lab was his sedimentation rate which was significantly elevated 84. Patient states that he has had intermittent pain like this previously but this is been the worse is been a long time. Denies any fevers or chills but did have a episode for 2 days where he was having significantly darkened urine. Patient hasn't stopped a blood thinner and went back on his Coumadin recently. In addition of this patient has stopped his Crestor to see if this was be causing some the pain but he did not make any improvement. Patient feels like it is not getting better at this time. Patient has not been quite is hungry in he is accompanied with his wife who thinks there is a possibility for him losing weight recently.      Past medical  history, social, surgical and family history all reviewed in electronic medical record.   Review of Systems: No headache, visual changes, nausea, vomiting, diarrhea, constipation, dizziness, abdominal pain, skin rash, fevers, chills, night sweats, weight loss, swollen lymph nodes, body aches, joint swelling, muscle aches, chest pain, shortness of breath, mood changes.   Objective Blood pressure 108/82, pulse 66, height 5\' 11"  (1.803 m), weight 160 lb (72.576 kg), SpO2 96 %.  General: No apparent distress alert and oriented x3 mood and affect normal, dressed appropriately.  HEENT: Pupils equal, extraocular movements intact  Respiratory: Patient's speak in full sentences and does not appear short of breath  Cardiovascular: No lower extremity edema, non tender, no erythema  Skin: Warm dry intact with no signs of infection or rash on extremities or on axial skeleton.  Abdomen: Soft nontender  Neuro: Cranial nerves II through XII are intact, neurovascularly intact in all extremities with 2+ DTRs and 2+ pulses.  Lymph: No lymphadenopathy of posterior or anterior cervical chain or axillae bilaterally.  Gait normal with good balance and coordination.  MSK:  Non tender with full range of motion and good stability and symmetric strength and tone of shoulders, elbows, wrist,  knee and ankles bilaterally. Patient though does have significant weakness from getting up out of a chair more than previous exam. Patient also has weakness of some of the musculature such as a shoulders and hips bilaterally. Hip: Right ROM IR: 25 Deg, ER: 45 Deg, Flexion: 120 Deg,  Extension: 100 Deg, Abduction: 45 Deg, Adduction: 45 Deg Strength Strength is 4 out of 5 but symmetric bilaterally. This is worse than previous exam Pelvic alignment unremarkable to inspection and palpation. Standing hip rotation and gait without trendelenburg sign / unsteadiness. Greater trochanter with tenderness posterior No tenderness over piriformis  . Severe pain with FABER and FADIR. No SI joint tenderness and normal minimal SI movement. Contralateral hip unremarkable   back exam shows some mild right-sided paraspinal musculature tenderness. Negative straight leg test.     Impression and Recommendations:     This case required medical decision making of moderate complexity.

## 2014-03-29 NOTE — Progress Notes (Signed)
Pre visit review using our clinic review tool, if applicable. No additional management support is needed unless otherwise documented below in the visit note. 

## 2014-03-29 NOTE — Assessment & Plan Note (Signed)
Patient is having some polymyalgia in the differential is quite broad at this time. Patient did have an elevated ESR which does show chronic inflammation. I think there is a good possibility that this could be a underlying autoimmune disease such as polymyalgia rheumatica. Patient does fit the profile for this at this time. Differential also includes other autoimmune diseases and labs were ordered today. Infectious etiology cannot be ruled out at this time and urinalysis was collected. Depending on these findings we might adjust treatment accordingly. We discussed the possibility of starting patient on prednisone already but without as knowing that this is not an infectious etiology we are going to hold and see how patient responds. Depending on how patient does we may need to consider further workup including more imaging such as MRI of the back and hip to see if there is any nerve compression that could be giving him some difficulty but patient is also having shoulder discomfort and weakness which makes me think more of a systemic illness. We will see how patient responds and see what our labs show an patient will follow-up with me very closely in the next week or 2.  Spent  25 minutes with patient face-to-face and had greater than 50% of counseling including as described above in assessment and plan.

## 2014-03-29 NOTE — Progress Notes (Unsigned)
Patient ID: Paul Ryan, male   DOB: 24-Feb-1946, 69 y.o.   MRN: [id number] I called patient to discuss lab results. Over the last month he is experiencing polymyalgia. He has been having chronic problems with his hip. He is seeing a sports medicine physician as well as his family physician. He has already received a steroid injection. I check muscle enzymes CK and aldolase both normal. Rheumatoid factor negative. Chemistry profile including liver tests are normal. The only abnormality was a significant elevation of ESR at 84 mm. I reviewed with him that this is nonspecific but suggest some comment of a acute or chronic inflammatory process. We elected to stop Xarelto to see if this was causing his symptoms. He is back on Coumadin. INR done yesterday therapeutic at 2.4. He would also like to stop his Crestor temporarily to see if this is causing his symptoms. I think this is a good idea as well.  He has a follow-up appointment with me next month.

## 2014-03-30 ENCOUNTER — Encounter: Payer: Self-pay | Admitting: Family Medicine

## 2014-03-30 LAB — CYCLIC CITRUL PEPTIDE ANTIBODY, IGG

## 2014-03-30 LAB — ANA: Anti Nuclear Antibody(ANA): NEGATIVE

## 2014-03-30 LAB — SJOGRENS SYNDROME-A EXTRACTABLE NUCLEAR ANTIBODY: SSA (RO) (ENA) ANTIBODY, IGG: NEGATIVE

## 2014-03-30 LAB — ANGIOTENSIN CONVERTING ENZYME: Angiotensin-Converting Enzyme: 29 U/L (ref 8–52)

## 2014-03-31 ENCOUNTER — Encounter: Payer: Self-pay | Admitting: Family Medicine

## 2014-03-31 MED ORDER — PREDNISONE 20 MG PO TABS: ORAL_TABLET | ORAL | Status: DC

## 2014-04-10 ENCOUNTER — Encounter: Payer: Self-pay | Admitting: Family Medicine

## 2014-04-11 ENCOUNTER — Other Ambulatory Visit: Payer: Self-pay | Admitting: Family Medicine

## 2014-04-11 DIAGNOSIS — D582 Other hemoglobinopathies: Secondary | ICD-10-CM

## 2014-04-11 DIAGNOSIS — E039 Hypothyroidism, unspecified: Secondary | ICD-10-CM

## 2014-04-11 DIAGNOSIS — R7989 Other specified abnormal findings of blood chemistry: Secondary | ICD-10-CM

## 2014-04-11 DIAGNOSIS — E782 Mixed hyperlipidemia: Secondary | ICD-10-CM

## 2014-04-11 DIAGNOSIS — M353 Polymyalgia rheumatica: Secondary | ICD-10-CM

## 2014-04-11 DIAGNOSIS — E559 Vitamin D deficiency, unspecified: Secondary | ICD-10-CM

## 2014-04-11 DIAGNOSIS — R945 Abnormal results of liver function studies: Secondary | ICD-10-CM

## 2014-04-18 ENCOUNTER — Ambulatory Visit (INDEPENDENT_AMBULATORY_CARE_PROVIDER_SITE_OTHER): Payer: Medicare Other | Admitting: Oncology

## 2014-04-18 ENCOUNTER — Encounter: Payer: Self-pay | Admitting: Oncology

## 2014-04-18 VITALS — BP 104/68 | HR 68 | Temp 98.2°F | Ht 71.0 in | Wt 162.5 lb

## 2014-04-18 DIAGNOSIS — D689 Coagulation defect, unspecified: Secondary | ICD-10-CM

## 2014-04-18 DIAGNOSIS — M2550 Pain in unspecified joint: Secondary | ICD-10-CM | POA: Diagnosis not present

## 2014-04-18 DIAGNOSIS — I82402 Acute embolism and thrombosis of unspecified deep veins of left lower extremity: Secondary | ICD-10-CM

## 2014-04-18 DIAGNOSIS — D6859 Other primary thrombophilia: Secondary | ICD-10-CM

## 2014-04-18 DIAGNOSIS — Z86718 Personal history of other venous thrombosis and embolism: Secondary | ICD-10-CM | POA: Diagnosis not present

## 2014-04-18 DIAGNOSIS — Z7901 Long term (current) use of anticoagulants: Secondary | ICD-10-CM | POA: Diagnosis not present

## 2014-04-18 DIAGNOSIS — M353 Polymyalgia rheumatica: Secondary | ICD-10-CM

## 2014-04-18 NOTE — Progress Notes (Signed)
Patient ID: Paul Ryan, male   DOB: 1945-06-24, 69 y.o.   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 At time of a recent unscheduled visit on January 19, he had developed new symptoms with diffuse probably myalgias and arthralgias. No new changes on exam. I stopped his Xarelto since there is a about a 2% incidence of musculoskeletal side effects from the drug and put him back on Coumadin. He stopped his Crestor. I obtained a number of laboratory studies remarkable only for a significant elevation of ESR at 84 mm on 03/21/2014. ANA negative, rheumatoid factor negative, C-reactive protein normal range, liver functions normal, ANCA screen negative. CK muscle enzyme normal. He was also evaluated by sports medicine. Additional tests included ACE level which was normal, cyclic citrulline peptide antibodies undetectable. Sjogren antibodies undetectable. He did not improve off Xarelto or Crestor. Dr. Tamala Julian started him on a trial of prednisone 20 mg and he did have significant relief of his symptoms. He is now tapering down to 10 mg and symptoms are again flaring.  No exam today  Impression: #1. 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 Previous right lower extremity DVT secondary to #1 #3. Chronic anticoagulation secondary to #1 and 2. At this point I am going to stop the warfarin and put him back on Xarelto 20 mg daily which she will need to take on a long-term basis. #4. Polymyalgia/polyarthralgia. Elevated ESR. Rule out polymyalgia rheumatica versus seronegative rheumatoid arthritis. He will continue to follow with the sports medicine Dr. Hulan Saas.  100 percent of this visit spent with direct face-to-face patient contact to review laboratory data and make recommendations on his anticoagulants.

## 2014-04-18 NOTE — Patient Instructions (Signed)
Stop warfarin  Resume Xarelto 20 mg daily  Return visit 1 year Call for any interim problems

## 2014-04-25 ENCOUNTER — Ambulatory Visit (INDEPENDENT_AMBULATORY_CARE_PROVIDER_SITE_OTHER): Payer: Medicare Other | Admitting: Family Medicine

## 2014-04-25 ENCOUNTER — Encounter: Payer: Self-pay | Admitting: Family Medicine

## 2014-04-25 VITALS — BP 122/84 | HR 64 | Ht 71.0 in | Wt 163.0 lb

## 2014-04-25 DIAGNOSIS — M353 Polymyalgia rheumatica: Secondary | ICD-10-CM

## 2014-04-25 MED ORDER — PREDNISONE 20 MG PO TABS: ORAL_TABLET | ORAL | Status: DC

## 2014-04-25 NOTE — Assessment & Plan Note (Signed)
Patient's increasing ESR with no other findings in his lambs are most consistent with a polymyalgia rheumatica. Patient differential also includes a postviral polymyalgia but I do not think this is likely at this time. There is no signs of any type of drug related changes at this time. Patient will be put on another higher dose of prednisone and will titrate accordingly over the course the next 2 weeks to see if he makes any improvement. Patient will also be referred to rheumatology for further specialization. Patient and will come back and see me again in 2 weeks before he leaves for [place] so we can discuss medicine and get prescriptions for symptomatic relief. Patient does have tramadol for breakthrough pain.  Spent  25 minutes with patient face-to-face and had greater than 50% of counseling including as described above in assessment and plan.

## 2014-04-25 NOTE — Progress Notes (Signed)
Pre visit review using our clinic review tool, if applicable. No additional management support is needed unless otherwise documented below in the visit note. 

## 2014-04-25 NOTE — Progress Notes (Signed)
[name] [address] [place], [place] [address] Phone: [phone] Subjective:     CC: Right hip pain follow up, multiple joint pains  [id number] Paul Ryan is a 69 y.o. male coming in with complaint of right hip pain. Patient was seen previously and did have a calcific changes of the greater trochanteric bursa. Patient did have a steroid injection and states that he had multiple months of being pain-free.  Patient though unfortunately continued to have A weakness as well as pain of the hips as well as the shoulder girdles bilaterally. Patient was found to have a significant elevated ESR Lab Results  Component Value Date   ESRSEDRATE 84* 03/21/2014   patient did have other labs but all other autoimmune labs seem to be unremarkable.  patient was put on prednisone and states that he was making some improvement but then started having worsening pain when patient started titrating off the prednisone. Patient states that it is more in his hands, shoulders as well as hip still. Denies any radiation of pain. Seems to be localized and worse in the mornings. States that after 2-3 hours the pain starts to subside somewhat. Patient has changed his Coagulation medication and stopped Zocor but neither of these seem to make any significant change.      Past medical history, social, surgical and family history all reviewed in electronic medical record.   Review of Systems: No headache, visual changes, nausea, vomiting, diarrhea, constipation, dizziness, abdominal pain, skin rash, fevers, chills, night sweats, weight loss, swollen lymph nodes, body aches, joint swelling, muscle aches, chest pain, shortness of breath, mood changes.   Objective Blood pressure 122/84, pulse 64, height _0  (1.803 m), weight 163 lb (73.936 kg), SpO2 98 %.  General: No apparent distress alert and oriented x3 mood and affect normal, dressed appropriately.  HEENT: Pupils equal, extraocular  movements intact  Respiratory: Patient's speak in full sentences and does not appear short of breath  Cardiovascular: No lower extremity edema, non tender, no erythema  Skin: Warm dry intact with no signs of infection or rash on extremities or on axial skeleton.  Abdomen: Soft nontender  Neuro: Cranial nerves II through XII are intact, neurovascularly intact in all extremities with 2+ DTRs and 2+ pulses.  Lymph: No lymphadenopathy of posterior or anterior cervical chain or axillae bilaterally.  Gait normal with good balance and coordination.  MSK:  Non tender with full range of motion and good stability and symmetric strength and tone of shoulders, elbows, wrist,  knee and ankles bilaterally. Patient though does have significant weakness from getting up out of a chair more than previous exam. Patient also has weakness of some of the musculature such as a shoulders and hips bilaterally. Hip: Right ROM IR: 25 Deg, ER: 45 Deg, Flexion: 120 Deg, Extension: 100 Deg, Abduction: 45 Deg, Adduction: 45 Deg Strength Strength is 4 out of 5 but symmetric bilaterally. This is worse than previous exam Pelvic alignment unremarkable to inspection and palpation. Standing hip rotation and gait without trendelenburg sign / unsteadiness. Greater trochanter with tenderness posterior swelling is generalized tenderness to the hip girdle bilaterally No tenderness over piriformis . Less pain than previous exam with Corky Sox No SI joint tenderness and normal minimal SI movement. Contralateral hip unremarkable   back exam shows some mild right-sided paraspinal musculature tenderness. Negative straight leg test.     Impression and Recommendations:     This case required medical decision making of moderate complexity.

## 2014-04-25 NOTE — Patient Instructions (Addendum)
Good to see you This is polymyalgia rheumatica. Prednisone daily at 40mg  daily for 5 days then 20 mg daily for 5 days then 10 mg daily for 5 days.  No ibuprofen while on prednisone We will get you to see rheumatology See me again in 2-3 weeks.

## 2014-04-27 ENCOUNTER — Encounter: Payer: Self-pay | Admitting: Family Medicine

## 2014-04-27 ENCOUNTER — Other Ambulatory Visit (INDEPENDENT_AMBULATORY_CARE_PROVIDER_SITE_OTHER): Payer: Medicare Other

## 2014-04-27 DIAGNOSIS — D582 Other hemoglobinopathies: Secondary | ICD-10-CM

## 2014-04-27 DIAGNOSIS — E782 Mixed hyperlipidemia: Secondary | ICD-10-CM

## 2014-04-27 DIAGNOSIS — M353 Polymyalgia rheumatica: Secondary | ICD-10-CM

## 2014-04-27 DIAGNOSIS — E039 Hypothyroidism, unspecified: Secondary | ICD-10-CM

## 2014-04-27 DIAGNOSIS — E559 Vitamin D deficiency, unspecified: Secondary | ICD-10-CM | POA: Diagnosis not present

## 2014-04-27 DIAGNOSIS — R7989 Other specified abnormal findings of blood chemistry: Secondary | ICD-10-CM

## 2014-04-27 DIAGNOSIS — Z0000 Encounter for general adult medical examination without abnormal findings: Secondary | ICD-10-CM

## 2014-04-27 LAB — HEPATIC FUNCTION PANEL
ALT: 51 U/L (ref 0–53)
AST: 23 U/L (ref 0–37)
Albumin: 3.7 g/dL (ref 3.5–5.2)
Alkaline Phosphatase: 55 U/L (ref 39–117)
BILIRUBIN DIRECT: 0.1 mg/dL (ref 0.0–0.3)
TOTAL PROTEIN: 6.9 g/dL (ref 6.0–8.3)
Total Bilirubin: 0.5 mg/dL (ref 0.2–1.2)

## 2014-04-27 LAB — VITAMIN D 25 HYDROXY (VIT D DEFICIENCY, FRACTURES): VITD: 52.64 ng/mL (ref 30.00–100.00)

## 2014-04-27 LAB — COMPREHENSIVE METABOLIC PANEL
ALK PHOS: 55 U/L (ref 39–117)
ALT: 51 U/L (ref 0–53)
AST: 23 U/L (ref 0–37)
Albumin: 3.7 g/dL (ref 3.5–5.2)
BUN: 20 mg/dL (ref 6–23)
CO2: 27 mEq/L (ref 19–32)
CREATININE: 0.97 mg/dL (ref 0.40–1.50)
Calcium: 9.2 mg/dL (ref 8.4–10.5)
Chloride: 105 mEq/L (ref 96–112)
GFR: 81.4 mL/min (ref >60.00)
Glucose, Bld: 90 mg/dL (ref 70–99)
Potassium: 4.4 mEq/L (ref 3.5–5.1)
Sodium: 139 mEq/L (ref 135–145)
Total Bilirubin: 0.5 mg/dL (ref 0.2–1.2)
Total Protein: 6.9 g/dL (ref 6.0–8.3)

## 2014-04-27 LAB — TSH: TSH: 0.73 u[IU]/mL (ref 0.35–4.50)

## 2014-04-27 LAB — LIPID PANEL
CHOLESTEROL: 308 mg/dL — AB (ref 0–200)
HDL: 60.2 mg/dL (ref >39.00)
LDL Cholesterol: 225 mg/dL — ABNORMAL HIGH (ref 0–99)
NonHDL: 247.8
Total CHOL/HDL Ratio: 5
Triglycerides: 112 mg/dL (ref 0.0–149.0)
VLDL: 22.4 mg/dL (ref 0.0–40.0)

## 2014-04-27 LAB — CBC
HCT: 41.9 % (ref 39.0–52.0)
Hemoglobin: 14.2 g/dL (ref 13.0–17.0)
MCHC: 33.8 g/dL (ref 30.0–36.0)
MCV: 88.8 fl (ref 78.0–100.0)
Platelets: 255 10*3/uL (ref 150.0–400.0)
RBC: 4.72 Mil/uL (ref 4.22–5.81)
RDW: 15.2 % (ref 11.5–15.5)
WBC: 13 10*3/uL — AB (ref 4.0–10.5)

## 2014-04-27 LAB — SEDIMENTATION RATE: SED RATE: 46 mm/h — AB (ref 0–22)

## 2014-04-28 MED ORDER — TRAMADOL HCL 50 MG PO TABS: 50.0000 mg | ORAL_TABLET | Freq: Three times a day (TID) | ORAL | Status: DC | PRN

## 2014-04-28 NOTE — Telephone Encounter (Signed)
Refill done.  

## 2014-05-02 ENCOUNTER — Ambulatory Visit (INDEPENDENT_AMBULATORY_CARE_PROVIDER_SITE_OTHER): Payer: Medicare Other | Admitting: Family Medicine

## 2014-05-02 ENCOUNTER — Other Ambulatory Visit (HOSPITAL_COMMUNITY)
Admission: RE | Admit: 2014-05-02 | Discharge: 2014-05-02 | Disposition: A | Discharge status: Home / Self Care | Payer: Medicare Other | Source: Ambulatory Visit | Attending: Family Medicine | Admitting: Family Medicine

## 2014-05-02 ENCOUNTER — Encounter: Payer: Self-pay | Admitting: Family Medicine

## 2014-05-02 VITALS — BP 117/80 | HR 67 | Temp 98.5°F | Ht 71.0 in | Wt 161.2 lb

## 2014-05-02 DIAGNOSIS — R8290 Unspecified abnormal findings in urine: Secondary | ICD-10-CM | POA: Diagnosis not present

## 2014-05-02 DIAGNOSIS — E039 Hypothyroidism, unspecified: Secondary | ICD-10-CM

## 2014-05-02 DIAGNOSIS — R350 Frequency of micturition: Secondary | ICD-10-CM | POA: Diagnosis not present

## 2014-05-02 DIAGNOSIS — Z0000 Encounter for general adult medical examination without abnormal findings: Secondary | ICD-10-CM

## 2014-05-02 DIAGNOSIS — E785 Hyperlipidemia, unspecified: Secondary | ICD-10-CM

## 2014-05-02 DIAGNOSIS — Z113 Encounter for screening for infections with a predominantly sexual mode of transmission: Secondary | ICD-10-CM | POA: Diagnosis present

## 2014-05-02 DIAGNOSIS — M353 Polymyalgia rheumatica: Secondary | ICD-10-CM

## 2014-05-02 DIAGNOSIS — E782 Mixed hyperlipidemia: Secondary | ICD-10-CM

## 2014-05-02 DIAGNOSIS — M25551 Pain in right hip: Secondary | ICD-10-CM

## 2014-05-02 MED ORDER — THYROID 60 MG PO TABS: 60.0000 mg | ORAL_TABLET | Freq: Every day | ORAL | Status: DC

## 2014-05-02 MED ORDER — RIVAROXABAN 20 MG PO TABS: 20.0000 mg | ORAL_TABLET | Freq: Every day | ORAL | Status: DC

## 2014-05-02 MED ORDER — THYROID 90 MG PO TABS: ORAL_TABLET | ORAL | Status: DC

## 2014-05-02 NOTE — Progress Notes (Signed)
Pre visit review using our clinic review tool, if applicable. No additional management support is needed unless otherwise documented below in the visit note. 

## 2014-05-02 NOTE — Patient Instructions (Signed)

## 2014-05-03 ENCOUNTER — Encounter: Payer: Self-pay | Admitting: Family Medicine

## 2014-05-03 DIAGNOSIS — Z0000 Encounter for general adult medical examination without abnormal findings: Secondary | ICD-10-CM | POA: Insufficient documentation

## 2014-05-03 NOTE — Assessment & Plan Note (Signed)
Responded initially to steroid injections but then worsened with polymyalgia.

## 2014-05-03 NOTE — Assessment & Plan Note (Signed)
Encouraged heart healthy diet, increase exercise, avoid trans fats, consider a krill oil cap daily. Patient declines restarting statins due to his recent trouble with pain, he  Is going to try Red Yeast Rice for now and reevaluate when he returns from [place]

## 2014-05-03 NOTE — Progress Notes (Signed)
Paul Ryan  [id number] 12/10/1945 05/03/2014      Progress Note-Follow Up  Subjective  Chief Complaint  Chief Complaint  Patient presents with  . Follow-up    HPI  Patient is a 69 y.o. male in today for routine medical care. Patient is in today for follow-up. He is feeling well today. He is recently had a flare of polymyalgia rheumatica but is responding to steroids. His hip pain is improved. He is following with rheumatology, Dr. Amil Amen. Has tried just come off of steroids but his pain flares. No recent illness. Doing well at home no trouble with activities of daily living exercises regularly and eats a heart healthy diet. Is able to pay his bills and function well. Denies CP/palp/SOB/HA/congestion/fevers/GI or GU c/o. Taking meds as prescribed  Past Medical History  Diagnosis Date  . DVT (deep venous thrombosis)     takes coumadin  . Shingles 1982    right abdominal wall  . Allergy     seasonal  . Hyperlipidemia   . Left hip pain 12/17/2010  . Preventative health care 12/17/2010  . 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 Hypothyroid 06/10/2011  . Vitamin D deficiency 10/14/2011  . Dysuria 02/27/2013  . Chronic anticoagulation 06/14/2013  . Polymyalgia 03/21/2014    Past Surgical History  Procedure Laterality Date  . 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 Cancer Mother     unknown- everywhere  . Cancer Sister     breast    History   Social History  . Marital Status: Married    Spouse Name: N/A  . Number of Children: N/A  . Years of Education: N/A   Occupational History  . Not on file.   Social History Main Topics  . Smoking status: Former Smoker -- 1.00 packs/day for 20 years    Types: Cigarettes    Quit date: 03/03/1980  . Smokeless tobacco: Never Used  . Alcohol Use: No  . Drug Use: No  . Sexual Activity: Not on file   Other Topics Concern  . Not  on file   Social History Narrative    Current Outpatient Prescriptions on File Prior to Visit  Medication Sig Dispense Refill  . b complex vitamins tablet Take 1 tablet by mouth daily.      . Cholecalciferol (VITAMIN D3 SUPER STRENGTH) 2000 UNITS TABS Take 2 tablets by mouth daily.      . Coenzyme Q10 (CO Q10) 100 MG CAPS Take 2 capsules by mouth daily.      Javier Docker Oil 300 MG CAPS 1 cap daily    . predniSONE (DELTASONE) 20 MG tablet 40mg  daily for 5 days, then 20mg  for 5 days then 10 mg for 5 days. 20 tablet 1  . tacrolimus (PROTOPIC) 0.1 % ointment Apply topically 2 (two) times daily. 100 g 1  . traMADol (ULTRAM) 50 MG tablet Take 1 tablet (50 mg total) by mouth every 12 (twelve) hours as needed. 60 tablet 0  . traMADol (ULTRAM) 50 MG tablet Take 1 tablet (50 mg total) by mouth 3 (three) times daily as needed. 90 tablet 0   No current facility-administered medications on file prior to visit.    No Known Allergies  Review of Systems  Review of Systems  Constitutional: Negative for fever and malaise/fatigue.  HENT: Negative for congestion.   Eyes: Negative for  discharge.  Respiratory: Negative for shortness of breath.   Cardiovascular: Negative for chest pain, palpitations and leg swelling.  Gastrointestinal: Negative for nausea, abdominal pain and diarrhea.  Genitourinary: Negative for dysuria.  Musculoskeletal: Negative for falls.  Skin: Negative for rash.  Neurological: Negative for loss of consciousness and headaches.  Endo/Heme/Allergies: Negative for polydipsia.  Psychiatric/Behavioral: Negative for depression and suicidal ideas. The patient is not nervous/anxious and does not have insomnia.     Objective  BP 117/80 mmHg  Pulse 67  Temp(Src) 98.5 F (36.9 C) (Oral)  Ht 5\' 11"  (1.803 m)  Wt 161 lb 4 oz (73.143 kg)  BMI 22.50 kg/m2  SpO2 97%  Physical Exam  Physical Exam  Constitutional: He is oriented to person, place, and time and well-developed,  well-nourished, and in no distress. No distress.  HENT:  Head: Normocephalic and atraumatic.  Eyes: Conjunctivae are normal.  Neck: Neck supple. No thyromegaly present.  Cardiovascular: Normal rate, regular rhythm and normal heart sounds.   No murmur heard. Pulmonary/Chest: Effort normal and breath sounds normal. No respiratory distress.  Abdominal: Soft. Bowel sounds are normal. He exhibits no distension and no mass. There is no tenderness.  Musculoskeletal: He exhibits no edema.  Neurological: He is alert and oriented to person, place, and time.  Skin: Skin is warm.  Psychiatric: Memory, affect and judgment normal.    Lab Results  Component Value Date   TSH 0.73 04/27/2014   Lab Results  Component Value Date   WBC 13.0* 04/27/2014   HGB 14.2 04/27/2014   HCT 41.9 04/27/2014   MCV 88.8 04/27/2014   PLT 255.0 04/27/2014   Lab Results  Component Value Date   CREATININE 0.97 04/27/2014   BUN 20 04/27/2014   NA 139 04/27/2014   K 4.4 04/27/2014   CL 105 04/27/2014   CO2 27 04/27/2014   Lab Results  Component Value Date   ALT 51 04/27/2014   ALT 51 04/27/2014   AST 23 04/27/2014   AST 23 04/27/2014   ALKPHOS 55 04/27/2014   ALKPHOS 55 04/27/2014   BILITOT 0.5 04/27/2014   BILITOT 0.5 04/27/2014   Lab Results  Component Value Date   CHOL 308* 04/27/2014   Lab Results  Component Value Date   HDL 60.20 04/27/2014   Lab Results  Component Value Date   LDLCALC 225* 04/27/2014   Lab Results  Component Value Date   TRIG 112.0 04/27/2014   Lab Results  Component Value Date   CHOLHDL 5 04/27/2014     Assessment & Plan  Polymyalgia rheumatica Has responded to steroids, is likely going to stay on them while he is in [place] and then will try and taper off.    Hip pain, right Responded initially to steroid injections but then worsened with polymyalgia.    Hyperlipidemia Encouraged heart healthy diet, increase exercise, avoid trans fats, consider a krill  oil cap daily. Patient declines restarting statins due to his recent trouble with pain, he  Is going to try Red Yeast Rice for now and reevaluate when he returns from [place]   Medicare annual wellness visit, subsequent Patient denies any difficulties at home. No trouble with ADLs, depression or falls. No recent changes to vision or hearing. Is UTD with immunizations. Is UTD with screening. Discussed Advanced Directives, patient agrees to bring [place] copies of documents if can. Encouraged heart healthy diet, exercise as tolerated and adequate sleep. Follows with [name] of Hematology Follows with Dr Charlann Boxer of [facility] Colonoscopy in 2007, repeat in 10 years Lab work reviewed.

## 2014-05-03 NOTE — Assessment & Plan Note (Signed)
Patient denies any difficulties at home. No trouble with ADLs, depression or falls. No recent changes to vision or hearing. Is UTD with immunizations. Is UTD with screening. Discussed Advanced Directives, patient agrees to bring [place] copies of documents if can. Encouraged heart healthy diet, exercise as tolerated and adequate sleep. Follows with [name] of Hematology Follows with Dr Charlann Boxer of [facility] Colonoscopy in 2007, repeat in 10 years Lab work reviewed.

## 2014-05-03 NOTE — Assessment & Plan Note (Signed)
Has responded to steroids, is likely going to stay on them while he is in [place] and then will try and taper off.

## 2014-05-04 LAB — URINE CYTOLOGY ANCILLARY ONLY: TRICH (WINDOWPATH): NEGATIVE

## 2014-05-05 LAB — URINE CYTOLOGY ANCILLARY ONLY
BACTERIAL VAGINITIS: NEGATIVE
CANDIDA VAGINITIS: NEGATIVE

## 2014-05-06 ENCOUNTER — Encounter: Payer: Self-pay | Admitting: Family Medicine

## 2014-05-08 ENCOUNTER — Other Ambulatory Visit: Payer: Self-pay | Admitting: Family Medicine

## 2014-05-08 ENCOUNTER — Telehealth: Payer: Self-pay | Admitting: *Deleted

## 2014-05-08 MED ORDER — CIPROFLOXACIN HCL 500 MG PO TABS: 500.0000 mg | ORAL_TABLET | Freq: Two times a day (BID) | ORAL | Status: DC

## 2014-05-08 NOTE — Telephone Encounter (Signed)
PA approved effective through 05/08/2015. JG//CMA

## 2014-05-08 NOTE — Telephone Encounter (Signed)
Prior authorization for Xarelto initiated. Awaiting determination. JG//CMA  

## 2014-05-09 ENCOUNTER — Encounter: Payer: Self-pay | Admitting: Oncology

## 2014-05-12 ENCOUNTER — Encounter: Payer: Self-pay | Admitting: Family Medicine

## 2014-05-12 ENCOUNTER — Ambulatory Visit (INDEPENDENT_AMBULATORY_CARE_PROVIDER_SITE_OTHER): Payer: Medicare Other | Admitting: Family Medicine

## 2014-05-12 VITALS — BP 114/72 | HR 60 | Ht 71.0 in | Wt 162.0 lb

## 2014-05-12 DIAGNOSIS — M353 Polymyalgia rheumatica: Secondary | ICD-10-CM

## 2014-05-12 MED ORDER — HYDROCODONE-ACETAMINOPHEN 7.5-325 MG PO TABS: 1.0000 | ORAL_TABLET | Freq: Three times a day (TID) | ORAL | Status: DC | PRN

## 2014-05-12 MED ORDER — PREDNISONE 20 MG PO TABS: 20.0000 mg | ORAL_TABLET | Freq: Every day | ORAL | Status: DC

## 2014-05-12 NOTE — Progress Notes (Signed)
  [name] [place], [place] [address] Phone: [phone] Subjective:     CC: Right hip pain follow up, multiple joint pains  [id number] Paul Ryan is a 69 y.o. male coming in with complaint of right hip pain. Patient was seen previously and did have a calcific changes of the greater trochanteric bursa. Patient did have a steroid injection and states that he had multiple months of being pain-free.  Patient though unfortunately continued to have A weakness as well as pain of the hips as well as the shoulder girdles bilaterally. Patient was found to have a significant elevated ESR Lab Results  Component Value Date   ESRSEDRATE 46* 04/27/2014   patient did have other labs but all other autoimmune labs seem to be unremarkable.  Patient has been diagnosed with polymyalgia rheumatica and has now finished his last dose of prednisone. Patient states he is feeling much better. Continues the Tylenol on a regular basis and is only taking tramadol as needed. Patient feels like he is doing better overall. Patient though is traveling and will be out of the country for the next 2 months. Patient as well as make sure that he has medications in case he has any type of flare.      Past medical history, social, surgical and family history all reviewed in electronic medical record.   Review of Systems: No headache, visual changes, nausea, vomiting, diarrhea, constipation, dizziness, abdominal pain, skin rash, fevers, chills, night sweats, weight loss, swollen lymph nodes, body aches, joint swelling, muscle aches, chest pain, shortness of breath, mood changes.   Objective Blood pressure 114/72, pulse 60, height $RemoveBe'5\' 11"'[id number]$  (1.803 m), weight 162 lb (73.483 kg).  General: No apparent distress alert and oriented x3 mood and affect normal, dressed appropriately.  HEENT: Pupils equal, extraocular movements intact  Respiratory: Patient's speak in full sentences and does not  appear short of breath  Cardiovascular: No lower extremity edema, non tender, no erythema  Skin: Warm dry intact with no signs of infection or rash on extremities or on axial skeleton.  Abdomen: Soft nontender  Neuro: Cranial nerves II through XII are intact, neurovascularly intact in all extremities with 2+ DTRs and 2+ pulses.  Lymph: No lymphadenopathy of posterior or anterior cervical chain or axillae bilaterally.  Gait normal with good balance and coordination.  MSK:  Non tender with full range of motion and good stability and symmetric strength and tone of shoulders, elbows, wrist,  knee and ankles bilaterally. Patient though does have significant weakness from getting up out of a chair more than previous exam. Significant improvement in hip and shoulder girdle strength Hip: Right ROM IR: 25 Deg with pain, ER: 45 Deg, Flexion: 120 Deg, Extension: 100 Deg, Abduction: 45 Deg, Adduction: 45 Deg Strength Strength is 4 out of 5 but symmetric bilaterally. Stable Pelvic alignment unremarkable to inspection and palpation. Standing hip rotation and gait without trendelenburg sign / unsteadiness. Less tender then before.  No tenderness over piriformis . No SI joint tenderness and normal minimal SI movement. Contralateral hip unremarkable   back exam shows some mild right-sided paraspinal musculature tenderness. Negative straight leg test.      Impression and Recommendations:     This case required medical decision making of moderate complexity.

## 2014-05-12 NOTE — Progress Notes (Signed)
Pre visit review using our clinic review tool, if applicable. No additional management support is needed unless otherwise documented below in the visit note. 

## 2014-05-12 NOTE — Assessment & Plan Note (Signed)
Patient has responded very well to the prednisone burst. I think that this will likely be helpful when needed. Patient is referred to a rheumatologist and will be seen him when he comes back into the country. We discussed the home exercises in standing active. We discussed the importance of the Tylenol as well as tramadol on a fairly regular basis and patient was given a prescription for Norco for breakthrough pain. Patient given more prednisone in case he needs burst while he is out of the country. Patient will come back in 2 months when he returns to further discuss his treatment options. Patient does have known severe hip arthritis and we may need to consider injection at some point. He male replacement.

## 2014-05-12 NOTE — Patient Instructions (Signed)
Good to see you Continue the tylenol regularly Tramadol up to 3 times a day and works good with tylenol Continue the exercises and the vitamins Prednisone only if severe fatigue and pain and do same burst as previously. Hydrocodone as needed for severe pain See me when you get back and have a great trip!

## 2014-05-14 ENCOUNTER — Encounter: Payer: Self-pay | Admitting: Family Medicine

## 2014-06-12 ENCOUNTER — Encounter: Payer: Medicare Other | Admitting: Oncology

## 2014-07-21 ENCOUNTER — Other Ambulatory Visit: Payer: Self-pay | Admitting: Family Medicine

## 2014-07-21 DIAGNOSIS — E039 Hypothyroidism, unspecified: Secondary | ICD-10-CM

## 2014-07-21 MED ORDER — THYROID 60 MG PO TABS: 60.0000 mg | ORAL_TABLET | Freq: Every day | ORAL | Status: DC

## 2014-07-21 MED ORDER — RIVAROXABAN 20 MG PO TABS: 20.0000 mg | ORAL_TABLET | Freq: Every day | ORAL | Status: DC

## 2014-07-21 MED ORDER — THYROID 90 MG PO TABS: ORAL_TABLET | ORAL | Status: DC

## 2014-07-21 NOTE — Telephone Encounter (Signed)
Refilled medication per Dr. Frederik Pear instructions.

## 2014-07-26 ENCOUNTER — Encounter: Payer: Self-pay | Admitting: Family Medicine

## 2014-07-28 ENCOUNTER — Encounter: Payer: Self-pay | Admitting: Family Medicine

## 2014-07-28 ENCOUNTER — Ambulatory Visit (INDEPENDENT_AMBULATORY_CARE_PROVIDER_SITE_OTHER): Payer: Medicare Other | Admitting: Family Medicine

## 2014-07-28 VITALS — BP 108/68 | HR 68 | Temp 97.8°F | Ht 71.0 in | Wt 161.5 lb

## 2014-07-28 DIAGNOSIS — I82409 Acute embolism and thrombosis of unspecified deep veins of unspecified lower extremity: Secondary | ICD-10-CM | POA: Diagnosis not present

## 2014-07-28 DIAGNOSIS — Z87448 Personal history of other diseases of urinary system: Secondary | ICD-10-CM | POA: Diagnosis not present

## 2014-07-28 DIAGNOSIS — M353 Polymyalgia rheumatica: Secondary | ICD-10-CM | POA: Diagnosis not present

## 2014-07-28 DIAGNOSIS — E039 Hypothyroidism, unspecified: Secondary | ICD-10-CM

## 2014-07-28 DIAGNOSIS — Z87898 Personal history of other specified conditions: Secondary | ICD-10-CM

## 2014-07-28 DIAGNOSIS — Z0000 Encounter for general adult medical examination without abnormal findings: Secondary | ICD-10-CM

## 2014-07-28 DIAGNOSIS — E785 Hyperlipidemia, unspecified: Secondary | ICD-10-CM

## 2014-07-28 LAB — LIPID PANEL
Cholesterol: 231 mg/dL — ABNORMAL HIGH (ref 0–200)
HDL: 44.3 mg/dL (ref >39.00)
LDL Cholesterol: 172 mg/dL — ABNORMAL HIGH (ref 0–99)
NONHDL: 186.7
Total CHOL/HDL Ratio: 5
Triglycerides: 73 mg/dL (ref 0.0–149.0)
VLDL: 14.6 mg/dL (ref 0.0–40.0)

## 2014-07-28 LAB — TSH: TSH: 1.2 u[IU]/mL (ref 0.35–4.50)

## 2014-07-28 LAB — CBC
HEMATOCRIT: 46.5 % (ref 39.0–52.0)
HEMOGLOBIN: 15.4 g/dL (ref 13.0–17.0)
MCHC: 33.2 g/dL (ref 30.0–36.0)
MCV: 91.6 fl (ref 78.0–100.0)
Platelets: 195 10*3/uL (ref 150.0–400.0)
RBC: 5.08 Mil/uL (ref 4.22–5.81)
RDW: 15.4 % (ref 11.5–15.5)
WBC: 7.8 10*3/uL (ref 4.0–10.5)

## 2014-07-28 LAB — COMPREHENSIVE METABOLIC PANEL
ALT: 21 U/L (ref 0–53)
AST: 18 U/L (ref 0–37)
Albumin: 3.9 g/dL (ref 3.5–5.2)
Alkaline Phosphatase: 37 U/L — ABNORMAL LOW (ref 39–117)
BUN: 16 mg/dL (ref 6–23)
CALCIUM: 9.3 mg/dL (ref 8.4–10.5)
CO2: 30 mEq/L (ref 19–32)
Chloride: 106 mEq/L (ref 96–112)
Creatinine, Ser: 1.09 mg/dL (ref 0.40–1.50)
GFR: 71.1 mL/min (ref >60.00)
Glucose, Bld: 93 mg/dL (ref 70–99)
Potassium: 4.3 mEq/L (ref 3.5–5.1)
Sodium: 141 mEq/L (ref 135–145)
TOTAL PROTEIN: 7 g/dL (ref 6.0–8.3)
Total Bilirubin: 0.7 mg/dL (ref 0.2–1.2)

## 2014-07-28 LAB — SEDIMENTATION RATE: SED RATE: 15 mm/h (ref 0–22)

## 2014-07-28 MED ORDER — PREDNISONE 5 MG PO TABS: ORAL_TABLET | ORAL | Status: DC

## 2014-07-28 NOTE — Patient Instructions (Signed)

## 2014-07-28 NOTE — Progress Notes (Signed)
Pre visit review using our clinic review tool, if applicable. No additional management support is needed unless otherwise documented below in the visit note. 

## 2014-07-31 ENCOUNTER — Encounter: Payer: Self-pay | Admitting: Family Medicine

## 2014-08-06 ENCOUNTER — Encounter: Payer: Self-pay | Admitting: Family Medicine

## 2014-08-06 NOTE — Assessment & Plan Note (Signed)
On Armour Thyroid, continue to monitor  

## 2014-08-06 NOTE — Progress Notes (Signed)
Paul Ryan  [id number] 01/29/1946 08/06/2014      Progress Note-Follow Up  Subjective  Chief Complaint  Chief Complaint  Patient presents with  . Follow-up    HPI  Patient is a 70 y.o. male in today for routine medical care. Patient is in today for follow-up having just returned from [place]. He feels well. His polymyalgia rheumatica was well controlled with steroids and he is following with rheumatology now. He did have trouble with the right eye infection and received treatment in [place]. Is doing well status post the treatment and denies any visual loss or concerns. No other recent illness. Denies CP/palp/SOB/HA/congestion/fevers/GI or GU c/o. Taking meds as prescribed  Past Medical History  Diagnosis Date  . DVT (deep venous thrombosis)     takes coumadin  . Shingles 1982    right abdominal wall  . Allergy     seasonal  . Hyperlipidemia   . Left hip pain 12/17/2010  . Preventative health care 12/17/2010  . 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 Hypothyroid 06/10/2011  . Vitamin D deficiency 10/14/2011  . Dysuria 02/27/2013  . Chronic anticoagulation 06/14/2013  . Polymyalgia 03/21/2014    Past Surgical History  Procedure Laterality Date  . 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 Cancer Mother     unknown- everywhere  . Cancer Sister     breast    History   Social History  . Marital Status: Married    Spouse Name: N/A  . Number of Children: N/A  . Years of Education: N/A   Occupational History  . Not on file.   Social History Main Topics  . Smoking status: Former Smoker -- 1.00 packs/day for 20 years    Types: Cigarettes    Quit date: 03/03/1980  . Smokeless tobacco: Never Used  . Alcohol Use: No  . Drug Use: No  . Sexual Activity: Not on file   Other Topics Concern  . Not on file   Social History Narrative    Current Outpatient Prescriptions on  File Prior to Visit  Medication Sig Dispense Refill  . b complex vitamins tablet Take 1 tablet by mouth daily.      . Cholecalciferol (VITAMIN D3 SUPER STRENGTH) 2000 UNITS TABS Take 2 tablets by mouth daily.      . Coenzyme Q10 (CO Q10) 100 MG CAPS Take 2 capsules by mouth daily.      Javier Docker Oil 300 MG CAPS 1 cap daily    . rivaroxaban (XARELTO) 20 MG TABS tablet Take 1 tablet (20 mg total) by mouth daily with supper. 90 tablet 2  . tacrolimus (PROTOPIC) 0.1 % ointment Apply topically 2 (two) times daily. 100 g 1  . thyroid (ARMOUR THYROID) 60 MG tablet Take 1 tablet (60 mg total) by mouth daily before breakfast. 45 tablet 6  . thyroid (ARMOUR THYROID) 90 MG tablet Alternate this with Armour 60 mg 1 tab qod 45 tablet 6   No current facility-administered medications on file prior to visit.    No Known Allergies  Review of Systems  Review of Systems  Constitutional: Negative for fever and malaise/fatigue.  HENT: Negative for congestion.   Eyes: Negative for discharge.  Respiratory: Negative for shortness of breath.   Cardiovascular: Negative for chest pain, palpitations and leg swelling.  Gastrointestinal: Negative for nausea, abdominal pain and diarrhea.  Genitourinary: Negative for dysuria.  Musculoskeletal: Negative for falls.  Skin: Negative for rash.  Neurological: Negative for loss of consciousness and headaches.  Endo/Heme/Allergies: Negative for polydipsia.  Psychiatric/Behavioral: Negative for depression and suicidal ideas. The patient is not nervous/anxious and does not have insomnia.     Objective  BP 108/68 mmHg  Pulse 68  Temp(Src) 97.8 F (36.6 C) (Oral)  Ht 5\' 11"  (1.803 m)  Wt 161 lb 8 oz (73.256 kg)  BMI 22.53 kg/m2  SpO2 94%  Physical Exam  Physical Exam  Constitutional: He is oriented to person, place, and time and well-developed, well-nourished, and in no distress. No distress.  HENT:  Head: Normocephalic and atraumatic.  Eyes: Conjunctivae are  normal.  Neck: Neck supple. No thyromegaly present.  Cardiovascular: Normal rate, regular rhythm and normal heart sounds.   No murmur heard. Pulmonary/Chest: Effort normal and breath sounds normal. No respiratory distress.  Abdominal: He exhibits no distension and no mass. There is no tenderness.  Musculoskeletal: He exhibits no edema.  Neurological: He is alert and oriented to person, place, and time.  Skin: Skin is warm.  Psychiatric: Memory, affect and judgment normal.    Lab Results  Component Value Date   TSH 1.20 07/28/2014   Lab Results  Component Value Date   WBC 7.8 07/28/2014   HGB 15.4 07/28/2014   HCT 46.5 07/28/2014   MCV 91.6 07/28/2014   PLT 195.0 07/28/2014   Lab Results  Component Value Date   CREATININE 1.09 07/28/2014   BUN 16 07/28/2014   NA 141 07/28/2014   K 4.3 07/28/2014   CL 106 07/28/2014   CO2 30 07/28/2014   Lab Results  Component Value Date   ALT 21 07/28/2014   AST 18 07/28/2014   ALKPHOS 37* 07/28/2014   BILITOT 0.7 07/28/2014   Lab Results  Component Value Date   CHOL 231* 07/28/2014   Lab Results  Component Value Date   HDL 44.30 07/28/2014   Lab Results  Component Value Date   LDLCALC 172* 07/28/2014   Lab Results  Component Value Date   TRIG 73.0 07/28/2014   Lab Results  Component Value Date   CHOLHDL 5 07/28/2014     Assessment & Plan  Hypothyroid On Armour Thyroid, continue to monitor   DVT (deep venous thrombosis) Is on lifelong anticoagulation and doing well   Hyperlipidemia Just returned from [place] and feels well. Cholesterol still up but better. Will need to consider restarting a statin but did have significant pain flare on last dose   Polymyalgia rheumatica Is following with rheumatology and his pain is better on the prednisone

## 2014-08-06 NOTE — Assessment & Plan Note (Signed)
Is on lifelong anticoagulation and doing well

## 2014-08-06 NOTE — Assessment & Plan Note (Addendum)
Just returned from [place] and feels well. Cholesterol still up but better. Will need to consider restarting a statin but did have significant pain flare on last dose

## 2014-08-06 NOTE — Assessment & Plan Note (Signed)
Is following with rheumatology and his pain is better on the prednisone

## 2014-08-07 ENCOUNTER — Ambulatory Visit (HOSPITAL_BASED_OUTPATIENT_CLINIC_OR_DEPARTMENT_OTHER)
Admission: RE | Admit: 2014-08-07 | Discharge: 2014-08-07 | Disposition: A | Discharge status: Home / Self Care | Payer: Medicare Other | Source: Ambulatory Visit | Attending: Family | Admitting: Family

## 2014-08-07 ENCOUNTER — Ambulatory Visit (INDEPENDENT_AMBULATORY_CARE_PROVIDER_SITE_OTHER): Payer: Medicare Other | Admitting: Family

## 2014-08-07 ENCOUNTER — Encounter (HOSPITAL_BASED_OUTPATIENT_CLINIC_OR_DEPARTMENT_OTHER): Payer: Self-pay | Admitting: Emergency Medicine

## 2014-08-07 ENCOUNTER — Encounter (HOSPITAL_BASED_OUTPATIENT_CLINIC_OR_DEPARTMENT_OTHER): Payer: Self-pay

## 2014-08-07 ENCOUNTER — Encounter: Payer: Self-pay | Admitting: Family

## 2014-08-07 ENCOUNTER — Inpatient Hospital Stay (HOSPITAL_BASED_OUTPATIENT_CLINIC_OR_DEPARTMENT_OTHER)
Admission: EM | Admit: 2014-08-07 | Discharge: 2014-08-08 | DRG: 176 | Disposition: A | Discharge status: Home / Self Care | Payer: Medicare Other | Attending: Internal Medicine | Admitting: Internal Medicine

## 2014-08-07 VITALS — BP 100/70 | HR 67 | Temp 98.0°F | Resp 16 | Ht 71.0 in | Wt 160.8 lb

## 2014-08-07 DIAGNOSIS — Z79899 Other long term (current) drug therapy: Secondary | ICD-10-CM | POA: Diagnosis not present

## 2014-08-07 DIAGNOSIS — E039 Hypothyroidism, unspecified: Secondary | ICD-10-CM | POA: Diagnosis present

## 2014-08-07 DIAGNOSIS — E785 Hyperlipidemia, unspecified: Secondary | ICD-10-CM | POA: Diagnosis present

## 2014-08-07 DIAGNOSIS — R0602 Shortness of breath: Secondary | ICD-10-CM

## 2014-08-07 DIAGNOSIS — J9811 Atelectasis: Secondary | ICD-10-CM | POA: Diagnosis present

## 2014-08-07 DIAGNOSIS — Z89022 Acquired absence of left finger(s): Secondary | ICD-10-CM | POA: Diagnosis not present

## 2014-08-07 DIAGNOSIS — Z86718 Personal history of other venous thrombosis and embolism: Secondary | ICD-10-CM | POA: Diagnosis not present

## 2014-08-07 DIAGNOSIS — Z86711 Personal history of pulmonary embolism: Secondary | ICD-10-CM

## 2014-08-07 DIAGNOSIS — Z87891 Personal history of nicotine dependence: Secondary | ICD-10-CM | POA: Diagnosis not present

## 2014-08-07 DIAGNOSIS — J302 Other seasonal allergic rhinitis: Secondary | ICD-10-CM | POA: Diagnosis present

## 2014-08-07 DIAGNOSIS — I2699 Other pulmonary embolism without acute cor pulmonale: Secondary | ICD-10-CM | POA: Insufficient documentation

## 2014-08-07 DIAGNOSIS — D688 Other specified coagulation defects: Secondary | ICD-10-CM | POA: Diagnosis not present

## 2014-08-07 DIAGNOSIS — Z7952 Long term (current) use of systemic steroids: Secondary | ICD-10-CM

## 2014-08-07 DIAGNOSIS — M353 Polymyalgia rheumatica: Secondary | ICD-10-CM | POA: Diagnosis present

## 2014-08-07 DIAGNOSIS — D6859 Other primary thrombophilia: Secondary | ICD-10-CM | POA: Diagnosis present

## 2014-08-07 DIAGNOSIS — Z7901 Long term (current) use of anticoagulants: Secondary | ICD-10-CM | POA: Diagnosis not present

## 2014-08-07 DIAGNOSIS — I82409 Acute embolism and thrombosis of unspecified deep veins of unspecified lower extremity: Secondary | ICD-10-CM | POA: Diagnosis not present

## 2014-08-07 DIAGNOSIS — E559 Vitamin D deficiency, unspecified: Secondary | ICD-10-CM | POA: Diagnosis present

## 2014-08-07 LAB — BASIC METABOLIC PANEL
Anion gap: 9 (ref 5–15)
BUN: 17 mg/dL (ref 6–20)
CHLORIDE: 104 mmol/L (ref 101–111)
CO2: 25 mmol/L (ref 22–32)
Calcium: 9.3 mg/dL (ref 8.9–10.3)
Creatinine, Ser: 1.01 mg/dL (ref 0.61–1.24)
GFR calc Af Amer: >60 mL/min (ref >60)
Glucose, Bld: 116 mg/dL — ABNORMAL HIGH (ref 65–99)
POTASSIUM: 4.2 mmol/L (ref 3.5–5.1)
Sodium: 138 mmol/L (ref 135–145)

## 2014-08-07 LAB — PROTIME-INR
INR: 1.13 (ref 0.00–1.49)
Prothrombin Time: 14.7 seconds (ref 11.6–15.2)

## 2014-08-07 LAB — CBC WITH DIFFERENTIAL/PLATELET
BASOS PCT: 0 % (ref 0–1)
Basophils Absolute: 0 10*3/uL (ref 0.0–0.1)
EOS ABS: 0.1 10*3/uL (ref 0.0–0.7)
Eosinophils Relative: 1 % (ref 0–5)
HEMATOCRIT: 46.1 % (ref 39.0–52.0)
Hemoglobin: 15.5 g/dL (ref 13.0–17.0)
Lymphocytes Relative: 18 % (ref 12–46)
Lymphs Abs: 1.9 10*3/uL (ref 0.7–4.0)
MCH: 30.2 pg (ref 26.0–34.0)
MCHC: 33.6 g/dL (ref 30.0–36.0)
MCV: 89.9 fL (ref 78.0–100.0)
MONO ABS: 0.5 10*3/uL (ref 0.1–1.0)
MONOS PCT: 5 % (ref 3–12)
Neutro Abs: 8 10*3/uL — ABNORMAL HIGH (ref 1.7–7.7)
Neutrophils Relative %: 76 % (ref 43–77)
Platelets: 207 10*3/uL (ref 150–400)
RBC: 5.13 MIL/uL (ref 4.22–5.81)
RDW: 13.6 % (ref 11.5–15.5)
WBC: 10.4 10*3/uL (ref 4.0–10.5)

## 2014-08-07 LAB — APTT: aPTT: 36 seconds (ref 24–37)

## 2014-08-07 MED ORDER — HEPARIN (PORCINE) IN NACL 100-0.45 UNIT/ML-% IJ SOLN: 1250.0000 [IU]/h | INTRAMUSCULAR | Status: DC

## 2014-08-07 MED ORDER — ACETAMINOPHEN 650 MG RE SUPP: 650.0000 mg | Freq: Four times a day (QID) | RECTAL | Status: DC | PRN

## 2014-08-07 MED ORDER — ACETAMINOPHEN 325 MG PO TABS: 650.0000 mg | ORAL_TABLET | Freq: Four times a day (QID) | ORAL | Status: DC | PRN

## 2014-08-07 MED ORDER — PREDNISONE 5 MG PO TABS
5.0000 mg | ORAL_TABLET | Freq: Every day | ORAL | Status: DC
  Administered 2014-08-08: 5 mg via ORAL
  Filled 2014-08-07 (×2): qty 1

## 2014-08-07 MED ORDER — IOHEXOL 350 MG/ML SOLN
100.0000 mL | Freq: Once | INTRAVENOUS | Status: AC | PRN
Start: 2014-08-07 — End: 2014-08-07
  Administered 2014-08-07: 100 mL via INTRAVENOUS

## 2014-08-07 MED ORDER — THYROID 60 MG PO TABS: 90.0000 mg | ORAL_TABLET | ORAL | Status: DC

## 2014-08-07 MED ORDER — HEPARIN SODIUM (PORCINE) 5000 UNIT/ML IJ SOLN: 4000.0000 [IU] | Freq: Once | INTRAMUSCULAR | Status: DC

## 2014-08-07 MED ORDER — SODIUM CHLORIDE 0.9 % IJ SOLN: 3.0000 mL | Freq: Two times a day (BID) | INTRAMUSCULAR | Status: DC

## 2014-08-07 MED ORDER — HEPARIN (PORCINE) IN NACL 100-0.45 UNIT/ML-% IJ SOLN
1150.0000 [IU]/h | INTRAMUSCULAR | Status: DC
  Administered 2014-08-07: 1250 [IU]/h via INTRAVENOUS
  Filled 2014-08-07 (×3): qty 250

## 2014-08-07 MED ORDER — ONDANSETRON HCL 4 MG/2ML IJ SOLN: 4.0000 mg | Freq: Four times a day (QID) | INTRAMUSCULAR | Status: DC | PRN

## 2014-08-07 MED ORDER — ONDANSETRON HCL 4 MG PO TABS: 4.0000 mg | ORAL_TABLET | Freq: Four times a day (QID) | ORAL | Status: DC | PRN

## 2014-08-07 MED ORDER — THYROID 60 MG PO TABS
60.0000 mg | ORAL_TABLET | ORAL | Status: DC
  Administered 2014-08-08: 60 mg via ORAL
  Filled 2014-08-07: qty 1

## 2014-08-07 NOTE — ED Notes (Signed)
Patient states that he was going to the NP earlier today for a new "virus" with SOB  - the patient reports that he has been on Xarelto b/c history of blood clot 5 years ago. He was changed from coumadin about 1 year ago. Today went to NP and was scaned for PE - and the patient was found to have a PE

## 2014-08-07 NOTE — Progress Notes (Signed)
Pre visit review using our clinic review tool, if applicable. No additional management support is needed unless otherwise documented below in the visit note. 

## 2014-08-07 NOTE — ED Notes (Signed)
Lab called blue top already in lab is sufficient to run new test orders CBC PTT Heparin level. Blood was obtained prior to starting heparin gtt. And good for baseline

## 2014-08-07 NOTE — Assessment & Plan Note (Signed)
CTA is + for PE.  He reports + xarelto compliance.  Will refer to ED for further evaluation and possible admission. Will need alternate anticoagulation and possible IVC filter.

## 2014-08-07 NOTE — ED Notes (Signed)
Paul Ryan currently denies SOB or chest pain is alert x3 skin P/W/D and resting comfortably.

## 2014-08-07 NOTE — Progress Notes (Signed)
ANTICOAGULATION CONSULT NOTE - Initial Consult  Pharmacy Consult for Heparin Indication: pulmonary embolus  No Known Allergies  Patient Measurements: Height: 5\' 11"  (180.3 cm) Weight: 160 lb (72.576 kg) IBW/kg (Calculated) : 75.3 Heparin Dosing Weight: 72.5 kg  Vital Signs: Temp: 98.5 F (36.9 C) (06/06 1956) Temp Source: Oral (06/06 1956) BP: 160/97 mmHg (06/06 1956) Pulse Rate: 72 (06/06 1956)  Labs:  Recent Labs  08/07/14 2010  HGB 15.5  HCT 46.1  PLT 207    Estimated Creatinine Clearance: 65.7 mL/min (by C-G formula based on Cr of 1.09).   Medical History: Past Medical History  Diagnosis Date  . DVT (deep venous thrombosis)     takes coumadin  . Shingles 1982    right abdominal wall  . Allergy     seasonal  . Hyperlipidemia   . Left hip pain 12/17/2010  . Preventative health care 12/17/2010  . 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 Hypothyroid 06/10/2011  . Vitamin D deficiency 10/14/2011  . Dysuria 02/27/2013  . Chronic anticoagulation 06/14/2013  . Polymyalgia 03/21/2014    Medications:   (Not in a hospital admission) Scheduled:  . heparin  4,000 Units Intravenous Once   Infusions:  . heparin      Assessment: 69yo male with history of DVT on Xarelto and herefitary protein S deficiency presents to [facility] with SOB. Pharmacy is consulted to dose heparin for PE found in LLL on CTA chest. CBC wnl, INR 1.13, baseline aPTT 36, baseline HL ordered.  Pt took last Xarelto dose 08/06/14 at bedtime. Will hold heparin bolus and start infusion at bedtime tonight.  Goal of Therapy:  Heparin level 0.3-0.7 units/ml aPTT 66-102 seconds Monitor platelets by anticoagulation protocol: Yes   Plan:  Start heparin infusion at 1250 units/hr Check anti-Xa level in 6 hours and daily while on heparin Continue to monitor H&H and platelets  6h aPTT  Andrey Cota. Diona Foley, PharmD Clinical Pharmacist Pager [phone] 08/07/2014,8:34 PM

## 2014-08-07 NOTE — Progress Notes (Signed)
Patient arrived to unit from [place] via [place].  Patient alert, oriented and ambulatory. Admission weight, vitals and assessment completed.  Fall and safety plan reviewed with patient. Patient currently resting comfortably, denies any pain and discomfort. Admitting MD bedside. Will continue to monitor. Blood pressure 141/83, pulse 70, temperature 98.3 F (36.8 C), temperature source Oral, resp. rate 18, height 5\' 11"  (1.803 m), weight 70.852 kg (156 lb 3.2 oz), SpO2 99 %. Tresa Endo

## 2014-08-07 NOTE — Patient Instructions (Addendum)
Please proceed directly to the ED on the first floor.  

## 2014-08-07 NOTE — Progress Notes (Signed)
Subjective:    Patient ID: Paul Ryan, male    DOB: 1946/02/23, 69 y.o.   MRN: [id number]  HPI  Paul Ryan is a 69 yr old male who presents today with chief complaint of cough. Cough is described as dry and is associated with a dull headache and sore throat x 4 days.  Reports that he spent 3 months in [place] returned several weeks ago.  He reports that the flight was 14 hours.  5-6 weeks ago he had similar symptoms (cold chills, dry cough). Reports he had X ray and blood work performed abroad which he was told was normal.  Symptoms resolved.  He saw Dr. Charlett Blake on 5/27 and reports that he did not even mention it because he was feeling better. For the last 3-4 days he had a terrible cough, headaches, + sob.  He is maintained on xarelto for hx of DVT (lifelong anticoagulation). He is on prednisone for PMR.  Reports that he saw a rheumatologist last week who is not convinced that his symptoms are due to PMR. Plan is to taper off of prednisone when he returns form [place].  He denies fever or calf pain.   Review of Systems See HPI  Past Medical History  Diagnosis Date  . DVT (deep venous thrombosis)     takes coumadin  . Shingles 1982    right abdominal wall  . Allergy     seasonal  . Hyperlipidemia   . Left hip pain 12/17/2010  . Preventative health care 12/17/2010  . 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 Hypothyroid 06/10/2011  . Vitamin D deficiency 10/14/2011  . Dysuria 02/27/2013  . Chronic anticoagulation 06/14/2013  . Polymyalgia 03/21/2014    History   Social History  . Marital Status: Married    Spouse Name: N/A  . Number of Children: N/A  . Years of Education: N/A   Occupational History  . Not on file.   Social History Main Topics  . Smoking status: Former Smoker -- 1.00 packs/day for 20 years    Types: Cigarettes    Quit date: 03/03/1980  . Smokeless tobacco: Never Used  . Alcohol Use: No  . Drug Use: No  . Sexual Activity: Not on file    Other Topics Concern  . Not on file   Social History Narrative    Past Surgical History  Procedure Laterality Date  . 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 Cancer Mother     unknown- everywhere  . Cancer Sister     breast    No Known Allergies  Current Outpatient Prescriptions on File Prior to Visit  Medication Sig Dispense Refill  . b complex vitamins tablet Take 1 tablet by mouth daily.      . Cholecalciferol (VITAMIN D3 SUPER STRENGTH) 2000 UNITS TABS Take 2 tablets by mouth daily.      . Coenzyme Q10 (CO Q10) 100 MG CAPS Take 2 capsules by mouth daily.      Javier Docker Oil 300 MG CAPS 1 cap daily    . predniSONE (DELTASONE) 5 MG tablet 1 tab po daily x 10 days then decrease to 1/2 tab po daily x 20 days then stop 30 tablet 0  . rivaroxaban (XARELTO) 20 MG TABS tablet Take 1 tablet (20 mg total) by mouth daily with supper. 90 tablet 2  . tacrolimus (  PROTOPIC) 0.1 % ointment Apply topically 2 (two) times daily. 100 g 1  . thyroid (ARMOUR THYROID) 60 MG tablet Take 1 tablet (60 mg total) by mouth daily before breakfast. 45 tablet 6  . thyroid (ARMOUR THYROID) 90 MG tablet Alternate this with Armour 60 mg 1 tab qod 45 tablet 6   No current facility-administered medications on file prior to visit.    BP 100/70 mmHg  Pulse 67  Temp(Src) 98 F (36.7 C) (Oral)  Resp 16  Ht 5\' 11"  (1.803 m)  Wt 160 lb 12.8 oz (72.938 kg)  BMI 22.44 kg/m2  SpO2 99%       Objective:   Physical Exam  Constitutional: He is oriented to person, place, and time. He appears well-developed and well-nourished. No distress.  HENT:  Head: Normocephalic and atraumatic.  Right Ear: Tympanic membrane and ear canal normal.  Left Ear: Tympanic membrane and ear canal normal.  Mild oropharyngeal erythema  Cardiovascular: Normal rate and regular rhythm.   No murmur heard. Pulmonary/Chest: Effort normal and breath sounds normal. No  respiratory distress. He has no wheezes. He has no rales.  Musculoskeletal: He exhibits no edema.  Lymphadenopathy:    He has no cervical adenopathy.  Neurological: He is alert and oriented to person, place, and time.  Skin: Skin is warm and dry.  Psychiatric: He has a normal mood and affect. His behavior is normal. Thought content normal.          Assessment & Plan:  Report was given to [facility] charge nurse in the ED.

## 2014-08-07 NOTE — ED Provider Notes (Signed)
CSN: [id number]     Arrival date & time 08/07/14  1948 History  This chart was scribed for Alfonzo Beers, MD by Julien Nordmann, ED Scribe. This patient was seen in room MH07/MH07 and the patient's care was started at 8:23 PM.    Chief Complaint  Patient presents with  . Shortness of Breath     The history is provided by the patient. No language interpreter was used.    HPI Comments: Chrisangel Eskenazi is a 69 y.o. male who presents to the Emergency Department to be further seen for PE. Pt reports having sob earlier but it alleviated. 5 years ago, pt was on a 15 hour flight and was seen by an orthopaedic surgeon and was diagnosed with DVT.  Pt switched from coumadin to Xarelto one year ago- was tolerating coumadin well, but switched to xarelto for convenienc.  [name] Kitchen Pt was seen today due to having cough and shortness of breath and was found to have a PE diagnosed on CT scan.  . Pt is currently on prednisone for 4 months- for possible PMR.   Pt denies swelling in legs and aching in legs.  No chest pain. No fainting.  There are no other associated systemic symptoms, there are no other alleviating or modifying factors.   Past Medical History  Diagnosis Date  . DVT (deep venous thrombosis)     takes coumadin  . Shingles 1982    right abdominal wall  . Allergy     seasonal  . Hyperlipidemia   . Left hip pain 12/17/2010  . Preventative health care 12/17/2010  . 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 Hypothyroid 06/10/2011  . Vitamin D deficiency 10/14/2011  . Dysuria 02/27/2013  . Chronic anticoagulation 06/14/2013  . Polymyalgia 03/21/2014   Past Surgical History  Procedure Laterality Date  . 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 Cancer Mother     unknown- everywhere  . Cancer Sister     breast   History  Substance Use Topics  . Smoking status: Former Smoker -- 1.00 packs/day for 20 years     Types: Cigarettes    Quit date: 03/03/1980  . Smokeless tobacco: Never Used  . Alcohol Use: No    Review of Systems  All other systems reviewed and are negative. ROS reviewed and all otherwise negative except for mentioned in HPI    Allergies  Review of patient's allergies indicates no known allergies.  Home Medications   Prior to Admission medications   Medication Sig Start Date End Date Taking? Authorizing Provider  b complex vitamins tablet Take 1 tablet by mouth daily.     Yes Historical Provider, MD  Cholecalciferol 4000 UNITS TABS Take 1 tablet by mouth daily.   Yes Historical Provider, MD  Coenzyme Q10 (COQ10 PO) Take 2 tablets by mouth daily.   Yes Historical Provider, MD  HYDROcodone-acetaminophen (NORCO) 7.5-325 MG per tablet 1 tablet every 6 (six) hours as needed (pain).  05/13/14  Yes Historical Provider, MD  Astrid Drafts 300 MG CAPS 1 cap daily 10/12/12  Yes [name], MD  NIACIN PO Take 1 tablet by mouth 3 (three) times daily.    Yes Historical Provider, MD  tacrolimus (PROTOPIC) 0.1 % ointment Apply topically 2 (two) times daily. 02/22/13  Yes [name], MD  thyroid [facility] THYROID) 60 MG tablet Take 1 tablet (  60 mg total) by mouth daily before breakfast. Patient taking differently: Take 60 mg by mouth every other day. Alternates with 90mg  every other day. 07/21/14  Yes [name], MD  thyroid [facility] THYROID) 90 MG tablet Alternate this with Armour 60 mg 1 tab qod 07/21/14  Yes [name], MD  enoxaparin (LOVENOX) 100 MG/ML injection Inject 1 mL (100 mg total) into the skin daily. 08/08/14   Geradine Girt, DO  predniSONE (DELTASONE) 5 MG tablet Take 1 tablet (5 mg total) by mouth daily with breakfast. 08/08/14   Geradine Girt, DO  warfarin (COUMADIN) 5 MG tablet Take 1 tablet (5 mg total) by mouth daily at 6 PM. 08/08/14   Geradine Girt, DO   Triage vitals: BP 160/97 mmHg  Pulse 72  Temp(Src) 98.5 F (36.9 C) (Oral)  Resp 18  Ht 5\' 11"  (1.803 m)  Wt 160  lb (72.576 kg)  BMI 22.33 kg/m2  SpO2 97% Vitals reviewed Physical Exam  Physical Examination: General appearance - alert, well appearing, and in no distress Mental status - alert, oriented to person, place, and time Eyes - no conjunctival injection, no scleral icterus Mouth - mucous membranes moist, pharynx normal without lesions Chest - clear to auscultation, no wheezes, rales or rhonchi, symmetric air entry Heart - normal rate, regular rhythm, normal S1, S2, no murmurs, rubs, clicks or gallops Abdomen - soft, nontender, nondistended, no masses or organomegaly Neurological - alert, oriented, normal speech, no focal findings or movement disorder noted Extremities - peripheral pulses normal, no pedal edema, no clubbing or cyanosis, no calf tenderness Skin - normal coloration and turgor, no rashes  ED Course  Procedures  DIAGNOSTIC STUDIES: Oxygen Saturation is 97% on RA, normal by my interpretation.  COORDINATION OF CARE:  8:30 PM Discussed treatment plan which includes IV heparin with pt at bedside and pt agreed to plan.   Labs Review Labs Reviewed  CBC WITH DIFFERENTIAL/PLATELET - Abnormal; Notable for the following:    Neutro Abs 8.0 (*)    All other components within normal limits  BASIC METABOLIC PANEL - Abnormal; Notable for the following:    Glucose, Bld 116 (*)    All other components within normal limits  HEPARIN LEVEL (UNFRACTIONATED) - Abnormal; Notable for the following:    Heparin Unfractionated 1.26 (*)    All other components within normal limits  COMPREHENSIVE METABOLIC PANEL - Abnormal; Notable for the following:    Glucose, Bld 106 (*)    Total Protein 6.3 (*)    Albumin 3.1 (*)    All other components within normal limits  APTT - Abnormal; Notable for the following:    aPTT 91 (*)    All other components within normal limits  PROTIME-INR - Abnormal; Notable for the following:    Prothrombin Time 15.4 (*)    All other components within normal limits   APTT - Abnormal; Notable for the following:    aPTT 125 (*)    All other components within normal limits  HEPARIN LEVEL (UNFRACTIONATED) - Abnormal; Notable for the following:    Heparin Unfractionated 1.58 (*)    All other components within normal limits  APTT  PROTIME-INR  CBC    Imaging Review Ct Angio Chest W/cm &/or Wo Cm  08/07/2014   ADDENDUM REPORT: 08/07/2014 19:11  ADDENDUM: Critical Value/emergent results were called by telephone at the time of interpretation on 08/07/2014 at 7:11 pm to Dr. Debbrah Alar , who verbally acknowledged these results.  Electronically Signed   By: Kerby Moors M.D.   On: 08/07/2014 19:11   08/07/2014   CLINICAL DATA:  Chest pain and shortness of breath. Recent 14 hour plane ride  EXAM: CT ANGIOGRAPHY CHEST WITH CONTRAST  TECHNIQUE: Multidetector CT imaging of the chest was performed using the standard protocol during bolus administration of intravenous contrast. Multiplanar CT image reconstructions and MIPs were obtained to evaluate the vascular anatomy.  CONTRAST:  164mL OMNIPAQUE IOHEXOL 350 MG/ML SOLN  COMPARISON:  None.  FINDINGS: Mediastinum: Normal heart size. No pericardial effusion identified. No mediastinal or hilar adenopathy. The trachea appears patent and is midline. Normal appearance of the esophagus. The main pulmonary artery appears normal. Single filling defect is identified within left lower lobe pulmonary artery, image 61 of series 4 and image 71 of series 8.  Lungs/Pleura: No pleural effusion identified. There is no airspace consolidation. Patchy areas of ground-glass attenuation in subsegmental atelectasis noted in the left lower lobe.  Upper Abdomen: Focal area of low attenuation along the dome of liver measures 7 mm, image 90/series 4. This is too small to characterize but likely represents a small cyst. Visualized portions of the spleen are normal. The adrenal glands are unremarkable.  Musculoskeletal: Review of the visualized osseous  structures is negative. No aggressive lytic or sclerotic bone lesions noted.  Review of the MIP images confirms the above findings.  IMPRESSION: 1. Examination is positive for left lower lobe pulmonary embolus. There is associated patchy ground-glass attenuation and subsegmental atelectasis within the left lower lobe.  Electronically Signed: By: Kerby Moors M.D. On: 08/07/2014 19:02     EKG Interpretation None      MDM   Final diagnoses:  Pulmonary embolism    Pt presenting with c/o shortnress of breath, hx of DVT on xarelto- CT angio performed prior to arrival in the ED showed PE.  Pt started on heparin and transferred to [facility] for admission in stable condition  I personally performed the services described in this documentation, which was scribed in my presence. The recorded information has been reviewed and is accurate.   Alfonzo Beers, MD 08/08/14 2014

## 2014-08-07 NOTE — H&P (Signed)
[facility] History and Physical  Patient: Paul Ryan  MRN: [id number]  DOB: [birth year]  DOS: the patient was seen and examined on 08/07/2014 PCP: Penni Homans, MD  Referring physician: Dr. Canary Brim Chief Complaint: Cough and shortness of breath  HPI: Paul Ryan is a 69 y.o. male with Past medical history of protein S deficiency, dyslipidemia, hypothyroidism, prior DVT. The patient is presenting with complaints of cough and shortness of breath. The patient mentions back in 2010 was diagnosed with DVT. He has seen Dr. Jim Desanctis after that diagnosis and was on warfarin for 4 years. One year ago He was switched to Xarelto and he has been asymptomatic. He went to [place] 3 months ago and stayed there remain compliant with all his medications. 5 weeks ago he had upper respiratory symptoms which gradually improving. He returned to [place] 2 weeks ago and started having reoccurrence of cough which was dry and nonproductive as well as sharp chest pain. He also started having shortness of breath. He was treated symptomatically for a recurrence of upper respiratory symptoms but since that did not improve he went to see his PCP again and he had a CAT scan as an outpatient. The CAT scan was positive for small pulmonary embolism and therefore he was referred to admission. The patient at the time of my evaluation denies any complain. He continues to mention is compliant with all his medications last Xarelto was taken on 08/06/2014. He mentions he has to attend his daughter's wedding in [place] one week after.  The patient is coming from home. And at his baseline independent for most of his ADL.  Review of Systems: as mentioned in the history of present illness.  A comprehensive review of the other systems is negative.  Past Medical History  Diagnosis Date  . DVT (deep venous thrombosis)     takes coumadin  . Shingles 1982    right abdominal wall  . Allergy     seasonal  . Hyperlipidemia    . Left hip pain 12/17/2010  . Preventative health care 12/17/2010  . 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 Hypothyroid 06/10/2011  . Vitamin D deficiency 10/14/2011  . Dysuria 02/27/2013  . Chronic anticoagulation 06/14/2013  . Polymyalgia 03/21/2014   Past Surgical History  Procedure Laterality Date  . 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 His smoking use included Cigarettes. He has a 20 pack-year smoking history. He has never used smokeless tobacco. He reports that he does not drink alcohol or use illicit drugs.  No Known Allergies  Family History  Problem Relation Age of Onset  . Cancer Mother     unknown- everywhere  . Cancer Sister     breast    Prior to Admission medications   Medication Sig Start Date End Date Taking? Authorizing Provider  tacrolimus (PROTOPIC) 0.1 % ointment Apply topically 2 (two) times daily. 02/22/13  Yes [name], MD  b complex vitamins tablet Take 1 tablet by mouth daily.      Historical Provider, MD  Cholecalciferol (VITAMIN D3 SUPER STRENGTH) 2000 UNITS TABS Take 2 tablets by mouth daily.      Historical Provider, MD  Coenzyme Q10 (CO Q10) 100 MG CAPS Take 2 capsules by mouth daily.      Historical Provider, MD  Astrid Drafts 300 MG CAPS 1 cap daily 10/12/12  [name], MD  predniSONE (DELTASONE) 5 MG tablet 1 tab po daily x 10 days then decrease to 1/2 tab po daily x 20 days then stop 07/28/14   [name], MD  rivaroxaban (XARELTO) 20 MG TABS tablet Take 1 tablet (20 mg total) by mouth daily with supper. 07/21/14   [name], MD  thyroid [facility] THYROID) 60 MG tablet Take 1 tablet (60 mg total) by mouth daily before breakfast. 07/21/14   [name], MD  thyroid [facility] THYROID) 90 MG tablet Alternate this with Armour 60 mg 1 tab qod 07/21/14   [name], MD    Physical Exam: Filed Vitals:   08/07/14 2130  08/07/14 2142 08/07/14 2144 08/07/14 2324  BP: 122/73 128/80 128/80 141/83  Pulse: 65 69 66 70  Temp:    98.3 F (36.8 C)  TempSrc:    Oral  Resp:   18 18  Height:    5\' 11"  (1.803 m)  Weight:    70.852 kg (156 lb 3.2 oz)  SpO2: 99% 97% 99% 99%    General: Alert, Awake and Oriented to Time, Place and Person. Appear in mild distress Eyes: PERRL ENT: Oral Mucosa clear moist. Neck: no JVD Cardiovascular: S1 and S2 Present, no Murmur, Peripheral Pulses Present Respiratory: Bilateral Air entry equal and Decreased,  Clear to Auscultation, no Crackles, no wheezes Abdomen: Bowel Sound present, Soft and nono tender Skin: no Rash Extremities: no Pedal edema, no calf tenderness Neurologic: Grossly no focal neuro deficit.  Labs on Admission:  CBC:  Recent Labs Lab 08/07/14 2010  WBC 10.4  NEUTROABS 8.0*  HGB 15.5  HCT 46.1  MCV 89.9  PLT 207    CMP     Component Value Date/Time   NA 138 08/07/2014 2010   NA 143 11/16/2012 1145   K 4.2 08/07/2014 2010   K 4.4 11/16/2012 1145   CL 104 08/07/2014 2010   CO2 25 08/07/2014 2010   CO2 27 11/16/2012 1145   GLUCOSE 116* 08/07/2014 2010   GLUCOSE 99 11/16/2012 1145   BUN 17 08/07/2014 2010   BUN 16.2 11/16/2012 1145   CREATININE 1.01 08/07/2014 2010   CREATININE 0.90 03/21/2014 1521   CREATININE 1.1 11/16/2012 1145   CALCIUM 9.3 08/07/2014 2010   CALCIUM 9.3 11/16/2012 1145   PROT 7.0 07/28/2014 1120   PROT 7.8 11/16/2012 1145   ALBUMIN 3.9 07/28/2014 1120   ALBUMIN 3.8 11/16/2012 1145   AST 18 07/28/2014 1120   AST 23 11/16/2012 1145   ALT 21 07/28/2014 1120   ALT 40 11/16/2012 1145   ALKPHOS 37* 07/28/2014 1120   ALKPHOS 48 11/16/2012 1145   BILITOT 0.7 07/28/2014 1120   BILITOT 0.75 11/16/2012 1145   GFRNONAA >60 08/07/2014 2010   GFRAA >60 08/07/2014 2010    No results for input(s): LIPASE, AMYLASE in the last 168 hours.  No results for input(s): CKTOTAL, CKMB, CKMBINDEX, TROPONINI in the last 168 hours. BNP  (last 3 results) No results for input(s): BNP in the last 8760 hours.  ProBNP (last 3 results) No results for input(s): PROBNP in the last 8760 hours.   Radiological Exams on Admission: Ct Angio Chest W/cm &/or Wo Cm  08/07/2014   ADDENDUM REPORT: 08/07/2014 19:11  ADDENDUM: Critical Value/emergent results were called by telephone at the time of interpretation on 08/07/2014 at 7:11 pm to Dr. Debbrah Alar , who verbally acknowledged these results.   Electronically Signed   By: Lovena Le  Clovis Riley M.D.   On: 08/07/2014 19:11   08/07/2014   CLINICAL DATA:  Chest pain and shortness of breath. Recent 14 hour plane ride  EXAM: CT ANGIOGRAPHY CHEST WITH CONTRAST  TECHNIQUE: Multidetector CT imaging of the chest was performed using the standard protocol during bolus administration of intravenous contrast. Multiplanar CT image reconstructions and MIPs were obtained to evaluate the vascular anatomy.  CONTRAST:  165mL OMNIPAQUE IOHEXOL 350 MG/ML SOLN  COMPARISON:  None.  FINDINGS: Mediastinum: Normal heart size. No pericardial effusion identified. No mediastinal or hilar adenopathy. The trachea appears patent and is midline. Normal appearance of the esophagus. The main pulmonary artery appears normal. Single filling defect is identified within left lower lobe pulmonary artery, image 61 of series 4 and image 71 of series 8.  Lungs/Pleura: No pleural effusion identified. There is no airspace consolidation. Patchy areas of ground-glass attenuation in subsegmental atelectasis noted in the left lower lobe.  Upper Abdomen: Focal area of low attenuation along the dome of liver measures 7 mm, image 90/series 4. This is too small to characterize but likely represents a small cyst. Visualized portions of the spleen are normal. The adrenal glands are unremarkable.  Musculoskeletal: Review of the visualized osseous structures is negative. No aggressive lytic or sclerotic bone lesions noted.  Review of the MIP images confirms the  above findings.  IMPRESSION: 1. Examination is positive for left lower lobe pulmonary embolus. There is associated patchy ground-glass attenuation and subsegmental atelectasis within the left lower lobe.  Electronically Signed: By: Kerby Moors M.D. On: 08/07/2014 19:02   EKG: Independently reviewed. normal sinus rhythm, nonspecific ST and T waves changes.  Assessment/Plan Principal Problem:   Acute pulmonary embolism Active Problems:   DVT (deep venous thrombosis)   Hereditary protein S deficiency   Hypothyroid   Polymyalgia rheumatica   1. Acute pulmonary embolism The patient is presenting with recurrence of pulmonary embolism. He has been diagnosed with protein S deficiency by hematology in the past. He has been switched to Xarelto one year ago. Recently he has returned from [place] 2 weeks ago. Due to his recurrence of the PE despite being on Xarelto he should be switched to different anticoagulation. Currently he is on heparin. Will discuss with hematology in the morning. We will also discuss about need for IVC filter placement.  2. Polymyalgia rheumatica. Continue prednisone at home doses.  3. Hypothyroidism. Continuing home thyroid dose.  Advance goals of care discussion: Full code   on chronic therapeutic anticoagulation. Nutrition: Nothing by mouth after midnight  Disposition: Admitted as inpatient, telemetry unit.  Author: Berle Mull, MD [facility] Pager: [phone] 08/07/2014  If 7PM-7AM, please contact night-coverage [email or web address] Password [email or web address]

## 2014-08-07 NOTE — ED Notes (Signed)
Call from pharmacy who was notified that Mr Paul Ryan took his last dose of xaraltro last PM heparin gtt dosage adjusted appropriately.

## 2014-08-08 ENCOUNTER — Encounter: Payer: Self-pay | Admitting: Oncology

## 2014-08-08 ENCOUNTER — Inpatient Hospital Stay (HOSPITAL_COMMUNITY): Payer: Medicare Other

## 2014-08-08 DIAGNOSIS — I2699 Other pulmonary embolism without acute cor pulmonale: Principal | ICD-10-CM

## 2014-08-08 DIAGNOSIS — M353 Polymyalgia rheumatica: Secondary | ICD-10-CM

## 2014-08-08 DIAGNOSIS — E039 Hypothyroidism, unspecified: Secondary | ICD-10-CM

## 2014-08-08 DIAGNOSIS — D688 Other specified coagulation defects: Secondary | ICD-10-CM

## 2014-08-08 LAB — HEPARIN LEVEL (UNFRACTIONATED)
HEPARIN UNFRACTIONATED: 1.26 [IU]/mL — AB (ref 0.30–0.70)
HEPARIN UNFRACTIONATED: 1.58 [IU]/mL — AB (ref 0.30–0.70)

## 2014-08-08 LAB — PROTIME-INR
INR: 1.21 (ref 0.00–1.49)
Prothrombin Time: 15.4 seconds — ABNORMAL HIGH (ref 11.6–15.2)

## 2014-08-08 LAB — CBC
HEMATOCRIT: 40.9 % (ref 39.0–52.0)
Hemoglobin: 13.9 g/dL (ref 13.0–17.0)
MCH: 30.3 pg (ref 26.0–34.0)
MCHC: 34 g/dL (ref 30.0–36.0)
MCV: 89.3 fL (ref 78.0–100.0)
PLATELETS: 193 10*3/uL (ref 150–400)
RBC: 4.58 MIL/uL (ref 4.22–5.81)
RDW: 13.5 % (ref 11.5–15.5)
WBC: 9.8 10*3/uL (ref 4.0–10.5)

## 2014-08-08 LAB — COMPREHENSIVE METABOLIC PANEL
ALBUMIN: 3.1 g/dL — AB (ref 3.5–5.0)
ALK PHOS: 42 U/L (ref 38–126)
ALT: 20 U/L (ref 17–63)
AST: 16 U/L (ref 15–41)
Anion gap: 7 (ref 5–15)
BILIRUBIN TOTAL: 0.6 mg/dL (ref 0.3–1.2)
BUN: 14 mg/dL (ref 6–20)
CHLORIDE: 106 mmol/L (ref 101–111)
CO2: 28 mmol/L (ref 22–32)
Calcium: 9 mg/dL (ref 8.9–10.3)
Creatinine, Ser: 1.06 mg/dL (ref 0.61–1.24)
Glucose, Bld: 106 mg/dL — ABNORMAL HIGH (ref 65–99)
Potassium: 3.9 mmol/L (ref 3.5–5.1)
SODIUM: 141 mmol/L (ref 135–145)
Total Protein: 6.3 g/dL — ABNORMAL LOW (ref 6.5–8.1)

## 2014-08-08 LAB — APTT
APTT: 125 s — AB (ref 24–37)
aPTT: 91 seconds — ABNORMAL HIGH (ref 24–37)

## 2014-08-08 MED ORDER — COUMADIN BOOK
Freq: Once | Status: AC
  Administered 2014-08-08: 14:00:00
  Filled 2014-08-08: qty 1

## 2014-08-08 MED ORDER — PREDNISONE 5 MG PO TABS: 5.0000 mg | ORAL_TABLET | Freq: Every day | ORAL | Status: DC

## 2014-08-08 MED ORDER — WARFARIN - PHARMACIST DOSING INPATIENT
Freq: Every day | Status: DC
  Administered 2014-08-08: 18:00:00

## 2014-08-08 MED ORDER — ENOXAPARIN SODIUM 100 MG/ML ~~LOC~~ SOLN: 100.0000 mg | SUBCUTANEOUS | Status: DC

## 2014-08-08 MED ORDER — WARFARIN VIDEO: Freq: Once | Status: DC

## 2014-08-08 MED ORDER — ENOXAPARIN SODIUM 120 MG/0.8ML ~~LOC~~ SOLN
105.0000 mg | SUBCUTANEOUS | Status: DC
  Administered 2014-08-08: 105 mg via SUBCUTANEOUS
  Filled 2014-08-08 (×2): qty 0.8

## 2014-08-08 MED ORDER — WARFARIN SODIUM 5 MG PO TABS
5.0000 mg | ORAL_TABLET | Freq: Every day | ORAL | Status: DC
  Administered 2014-08-08: 5 mg via ORAL
  Filled 2014-08-08 (×2): qty 1

## 2014-08-08 MED ORDER — WARFARIN SODIUM 5 MG PO TABS: 5.0000 mg | ORAL_TABLET | Freq: Every day | ORAL | Status: DC

## 2014-08-08 NOTE — Progress Notes (Signed)
PROGRESS NOTE  Paul Ryan XBD:[id number] DOB: October 31, 1945 DOA: 08/07/2014 PCP: Penni Homans, MD  Assessment/Plan: Acute pulmonary embolism -recurrence of pulmonary embolism. - protein S deficiency  - has been on Xarelto for one yea. -returned from [place] 2 weeks ago. -Currently on heparin. -duplex pending -consult Dr. Beryle Beams  Polymyalgia rheumatica. Continue prednisone at home doses.  Hypothyroidism. Continue home thyroid dose.   Code Status: full Family Communication: patient Disposition Plan:    Consultants:  Hematology  Procedures:      HPI/Subjective: Feeling well, no SOB, no CP  Objective: Filed Vitals:   08/08/14 0450  BP: 110/66  Pulse: 56  Temp: 98.3 F (36.8 C)  Resp: 18    Intake/Output Summary (Last 24 hours) at 08/08/14 0837 Last data filed at 08/08/14 8341  Gross per 24 hour  Intake      0 ml  Output    875 ml  Net   -875 ml   Filed Weights   08/07/14 1956 08/07/14 2324  Weight: 72.576 kg (160 lb) 70.852 kg (156 lb 3.2 oz)    Exam:   General:  A+Ox3, NAD  Cardiovascular: rrr  Respiratory: clear, no wheezing  Abdomen: +BS, soft  Musculoskeletal: no edema   Data Reviewed: Basic Metabolic Panel:  Recent Labs Lab 08/07/14 2010 08/08/14 0426  NA 138 141  K 4.2 3.9  CL 104 106  CO2 25 28  GLUCOSE 116* 106*  BUN 17 14  CREATININE 1.01 1.06  CALCIUM 9.3 9.0   Liver Function Tests:  Recent Labs Lab 08/08/14 0426  AST 16  ALT 20  ALKPHOS 42  BILITOT 0.6  PROT 6.3*  ALBUMIN 3.1*   No results for input(s): LIPASE, AMYLASE in the last 168 hours. No results for input(s): AMMONIA in the last 168 hours. CBC:  Recent Labs Lab 08/07/14 2010 08/08/14 0426  WBC 10.4 9.8  NEUTROABS 8.0*  --   HGB 15.5 13.9  HCT 46.1 40.9  MCV 89.9 89.3  PLT 207 193   Cardiac Enzymes: No results for input(s): CKTOTAL, CKMB, CKMBINDEX, TROPONINI in the last 168 hours. BNP (last 3 results) No results for input(s): BNP in  the last 8760 hours.  ProBNP (last 3 results) No results for input(s): PROBNP in the last 8760 hours.  CBG: No results for input(s): GLUCAP in the last 168 hours.  No results found for this or any previous visit (from the past 240 hour(s)).   Studies: Ct Angio Chest W/cm &/or Wo Cm  08/07/2014   ADDENDUM REPORT: 08/07/2014 19:11  ADDENDUM: Critical Value/emergent results were called by telephone at the time of interpretation on 08/07/2014 at 7:11 pm to Dr. Debbrah Alar , who verbally acknowledged these results.   Electronically Signed   By: Kerby Moors M.D.   On: 08/07/2014 19:11   08/07/2014   CLINICAL DATA:  Chest pain and shortness of breath. Recent 14 hour plane ride  EXAM: CT ANGIOGRAPHY CHEST WITH CONTRAST  TECHNIQUE: Multidetector CT imaging of the chest was performed using the standard protocol during bolus administration of intravenous contrast. Multiplanar CT image reconstructions and MIPs were obtained to evaluate the vascular anatomy.  CONTRAST:  166mL OMNIPAQUE IOHEXOL 350 MG/ML SOLN  COMPARISON:  None.  FINDINGS: Mediastinum: Normal heart size. No pericardial effusion identified. No mediastinal or hilar adenopathy. The trachea appears patent and is midline. Normal appearance of the esophagus. The main pulmonary artery appears normal. Single filling defect is identified within left lower lobe pulmonary artery, image 61 of series  4 and image 71 of series 8.  Lungs/Pleura: No pleural effusion identified. There is no airspace consolidation. Patchy areas of ground-glass attenuation in subsegmental atelectasis noted in the left lower lobe.  Upper Abdomen: Focal area of low attenuation along the dome of liver measures 7 mm, image 90/series 4. This is too small to characterize but likely represents a small cyst. Visualized portions of the spleen are normal. The adrenal glands are unremarkable.  Musculoskeletal: Review of the visualized osseous structures is negative. No aggressive lytic or  sclerotic bone lesions noted.  Review of the MIP images confirms the above findings.  IMPRESSION: 1. Examination is positive for left lower lobe pulmonary embolus. There is associated patchy ground-glass attenuation and subsegmental atelectasis within the left lower lobe.  Electronically Signed: By: Kerby Moors M.D. On: 08/07/2014 19:02    Scheduled Meds: . predniSONE  5 mg Oral Q breakfast  . sodium chloride  3 mL Intravenous Q12H  . thyroid  60 mg Oral QODAY  . [START ON 08/09/2014] thyroid  90 mg Oral QODAY   Continuous Infusions: . heparin 1,250 Units/hr (08/07/14 2103)   Antibiotics Given (last 72 hours)    None      Principal Problem:   Acute pulmonary embolism Active Problems:   DVT (deep venous thrombosis)   Hereditary protein S deficiency   Hypothyroid   Polymyalgia rheumatica    Time spent: 25 min    Rana Hochstein  [facility] Pager [phone]. If 7PM-7AM, please contact night-coverage at [email or web address], password [facility] 08/08/2014, 8:37 AM  LOS: 1 day

## 2014-08-08 NOTE — Progress Notes (Signed)
ANTICOAGULATION CONSULT NOTE - Follow-up Consult  Pharmacy Consult for Heparin Indication: pulmonary embolus  No Known Allergies  Patient Measurements: Height: 5\' 11"  (180.3 cm) Weight: 156 lb 3.2 oz (70.852 kg) (Scale A) IBW/kg (Calculated) : 75.3 Heparin Dosing Weight: 72.5 kg  Vital Signs: Temp: 98.3 F (36.8 C) (06/07 0450) Temp Source: Oral (06/07 0450) BP: 110/66 mmHg (06/07 0450) Pulse Rate: 56 (06/07 0450)  Labs:  Recent Labs  08/07/14 2010 08/08/14 0033 08/08/14 0426  HGB 15.5  --  13.9  HCT 46.1  --  40.9  PLT 207  --  193  APTT 36  --  91*  LABPROT 14.7  --  15.4*  INR 1.13  --  1.21  HEPARINUNFRC  --  1.26*  --   CREATININE 1.01  --   --     Estimated Creatinine Clearance: 69.2 mL/min (by C-G formula based on Cr of 1.01).   Assessment: 69yo male with history of DVT on Xarelto PTA and hereditary protein S deficiency. Pt found to have PE in LLL on CTA chest.  Pt took last Xarelto dose 08/06/14 at bedtime. Baseline heparin level 1.26 (being affected by Xarelto). Baseline aPTT 36 sec. Heparin started for new PE. APTT 91 sec (therapeutic) on 1250 units/hr.  Currently using aPTT to monitor heparin since Xarelto still affecting heparin level.  Goal of Therapy:  Heparin level 0.3-0.7 units/ml aPTT 66-102 seconds Monitor platelets by anticoagulation protocol: Yes   Plan:  Continue heparin at 1250 units/hr Will f/u 6 hr aPTT to confirm therapeutic Daily heparin level, aPTT, and CBC  Sherlon Handing, PharmD, BCPS Clinical pharmacist, pager [phone] 08/08/2014,6:13 AM

## 2014-08-08 NOTE — Progress Notes (Signed)
Received call from attending physician who states that an order has been placed for Care management consult; orders will be written for Coumadin/Lovenox bridge per MD and patient has pending discharge until case management discusses medications with patient. Patient is currently in no distress or have any complaints at this time, will continue to monitor the patient. Ruben Im RN

## 2014-08-08 NOTE — Progress Notes (Addendum)
ANTICOAGULATION CONSULT NOTE - Follow Up Consult  Pharmacy Consult for Heparin>>Lovenox Indication: Recurrent VTE with Protein S def.  No Known Allergies  Patient Measurements: Height: 5\' 11"  (180.3 cm) Weight: 156 lb 3.2 oz (70.852 kg) (Scale A) IBW/kg (Calculated) : 75.3 Heparin Dosing Weight: 70.9  Vital Signs: Temp: 98.3 F (36.8 C) (06/07 0450) Temp Source: Oral (06/07 0450) BP: 110/66 mmHg (06/07 0450) Pulse Rate: 56 (06/07 0450)  Labs:  Recent Labs  08/07/14 2010 08/07/14 2049 08/08/14 0033 08/08/14 0426 08/08/14 1217  HGB 15.5  --   --  13.9  --   HCT 46.1  --   --  40.9  --   PLT 207  --   --  193  --   APTT 36  --   --  91* 125*  LABPROT 14.7  --   --  15.4*  --   INR 1.13  --   --  1.21  --   HEPARINUNFRC  --  1.58* 1.26*  --   --   CREATININE 1.01  --   --  1.06  --     Estimated Creatinine Clearance: 66 mL/min (by C-G formula based on Cr of 1.06).  Assessment: 69yo male with history of DVT on Xarelto and hereditary protein S deficiency presents to [facility] with SOB. Pharmacy is consulted to dose heparin for PE found in LLL on CTA chest. CBC wnl, INR 1.13, baseline aPTT 36, baseline HL 1.26  AC: h/o DVT, Protein S def, new PE (failed Xarelto) after a recent long trip. Now on heparin/Coumadin. INR 1.13>1.21. APTT 125 slightly greater than goal. CBC WNL. Change to Lovenox.  CV: BP ok but HR 56. No CV meds  Endo: Thyroid  Neuro: Polymyalgia rheumatica on chronic prednisone  Nephro: Scr 1.06  Pulm: RA  Hem/Onc: CBC WNL   PTA Med Issues: none   Goal of Therapy:  LMWH level 0.6-1.2 Monitor platelets by anticoagulation protocol: Yes   Plan:  D/c IV heparin. Start Lovenox 105mg  SQ once daily (1.5mg /kg/day)  Paul Ryan, PharmD, BCPS Clinical Staff Pharmacist Pager [phone]  Paul Ryan 08/08/2014,1:20 PM

## 2014-08-08 NOTE — Progress Notes (Signed)
CM CONSULT  For lovenox  Patient has private medical insurance with [facility] with prescription drug coverage; Paul Ryan [phone]

## 2014-08-08 NOTE — Progress Notes (Signed)
*  Preliminary Results* Bilateral lower extremity venous duplex completed. Bilateral lower extremities are negative for deep vein thrombosis. There is no evidence of Baker's cyst bilaterally.  08/08/2014  Maudry Mayhew, RVT, RDCS, RDMS

## 2014-08-08 NOTE — Progress Notes (Signed)
Patient's IV and telemetry has been discontinued, discharge instructions has been reviewed with the patient.  Patient has demonstrated how to properly use Lovenox injections.  The patient has no further questions at this time and is being discharged.  Ruben Im RN

## 2014-08-08 NOTE — Discharge Summary (Signed)
Physician Discharge Summary  [place] CWU:[id number] DOB: December 01, 1945 DOA: 08/07/2014  PCP: Penni Homans, MD  Admit date: 08/07/2014 Discharge date: 08/08/2014  Time spent: 35 minutes  Recommendations for Outpatient Follow-up:  1. PT/INR on Friday  Discharge Diagnoses:  Principal Problem:   Acute pulmonary embolism Active Problems:   DVT (deep venous thrombosis)   Hereditary protein S deficiency   Hypothyroid   Polymyalgia rheumatica   Discharge Condition: stable  Diet recommendation: regular  Filed Weights   08/07/14 1956 08/07/14 2324  Weight: 72.576 kg (160 lb) 70.852 kg (156 lb 3.2 oz)    History of present illness:  [place] is a 69 y.o. male with Past medical history of protein S deficiency, dyslipidemia, hypothyroidism, prior DVT. The patient is presenting with complaints of cough and shortness of breath. The patient mentions back in 2010 was diagnosed with DVT. He has seen Dr. Jim Desanctis after that diagnosis and was on warfarin for 4 years. One year ago He was switched to Xarelto and he has been asymptomatic. He went to [place] 3 months ago and stayed there remain compliant with all his medications. 5 weeks ago he had upper respiratory symptoms which gradually improving. He returned to [place] 2 weeks ago and started having reoccurrence of cough which was dry and nonproductive as well as sharp chest pain. He also started having shortness of breath. He was treated symptomatically for a recurrence of upper respiratory symptoms but since that did not improve he went to see his PCP again and he had a CAT scan as an outpatient. The CAT scan was positive for small pulmonary embolism and therefore he was referred to admission. The patient at the time of my evaluation denies any complain. He continues to mention is compliant with all his medications last Xarelto was taken on 08/06/2014. He mentions he has to attend his daughter's wedding in [place] one week  after.   Hospital Course:  Acute pulmonary embolism -recurrence of pulmonary embolism. - protein S deficiency  - has been on Xarelto for one year -returned from [place] 2 weeks ago. -duplex negative  -spoke with Dr. Beryle Beams- change from [place] back to coumadin- can be d/c'd on lovenox/coumadin bridge- ok to travel once anticoagulated - explained to patient and he says he has done lovenox before and is comfortable with the plan  Polymyalgia rheumatica. Continue prednisone at home doses.  Hypothyroidism. Continue home thyroid dose.    Procedures:  duplex  Consultations:  Hematology- phone  Discharge Exam: Filed Vitals:   08/08/14 1359  BP: 98/71  Pulse: 65  Temp: 98 F (36.7 C)  Resp: 20    General: A+Ox3, NAD Cardiovascular: rrr Respiratory: clear  Discharge Instructions   Discharge Instructions    Diet general    Complete by:  As directed      Discharge instructions    Complete by:  As directed   PT/INR on Friday with PCP-- goal 2-3     Increase activity slowly    Complete by:  As directed           Current Discharge Medication List    START taking these medications   Details  enoxaparin (LOVENOX) 100 MG/ML injection Inject 1 mL (100 mg total) into the skin daily. Qty: 7 Syringe, Refills: 0    warfarin (COUMADIN) 5 MG tablet Take 1 tablet (5 mg total) by mouth daily at 6 PM. Qty: 30 tablet, Refills: 0      CONTINUE these medications which have CHANGED  Details  predniSONE (DELTASONE) 5 MG tablet Take 1 tablet (5 mg total) by mouth daily with breakfast.      CONTINUE these medications which have NOT CHANGED   Details  b complex vitamins tablet Take 1 tablet by mouth daily.      Cholecalciferol 4000 UNITS TABS Take 1 tablet by mouth daily.    Coenzyme Q10 (COQ10 PO) Take 2 tablets by mouth daily.    HYDROcodone-acetaminophen (NORCO) 7.5-325 MG per tablet     Krill Oil 300 MG CAPS 1 cap daily   Associated Diagnoses: Other and  unspecified hyperlipidemia    NIACIN PO Take 1 tablet by mouth 3 (three) times daily.     tacrolimus (PROTOPIC) 0.1 % ointment Apply topically 2 (two) times daily. Qty: 100 g, Refills: 1   Associated Diagnoses: Urine frequency; Penile discharge    !! thyroid (ARMOUR THYROID) 60 MG tablet Take 1 tablet (60 mg total) by mouth daily before breakfast. Qty: 45 tablet, Refills: 6   Associated Diagnoses: Hypothyroidism, unspecified hypothyroidism type    !! thyroid (ARMOUR THYROID) 90 MG tablet Alternate this with Armour 60 mg 1 tab qod Qty: 45 tablet, Refills: 6     !! - Potential duplicate medications found. Please discuss with provider.    STOP taking these medications     rivaroxaban (XARELTO) 20 MG TABS tablet        No Known Allergies Follow-up Information    Please follow up.   Why:  patient has a scheduled appt on June 10th @ 2:15PM with Internal Medicine       The results of significant diagnostics from this hospitalization (including imaging, microbiology, ancillary and laboratory) are listed below for reference.    Significant Diagnostic Studies: Ct Angio Chest W/cm &/or Wo Cm  08/07/2014   ADDENDUM REPORT: 08/07/2014 19:11  ADDENDUM: Critical Value/emergent results were called by telephone at the time of interpretation on 08/07/2014 at 7:11 pm to Dr. Debbrah Alar , who verbally acknowledged these results.   Electronically Signed   By: Kerby Moors M.D.   On: 08/07/2014 19:11   08/07/2014   CLINICAL DATA:  Chest pain and shortness of breath. Recent 14 hour plane ride  EXAM: CT ANGIOGRAPHY CHEST WITH CONTRAST  TECHNIQUE: Multidetector CT imaging of the chest was performed using the standard protocol during bolus administration of intravenous contrast. Multiplanar CT image reconstructions and MIPs were obtained to evaluate the vascular anatomy.  CONTRAST:  182mL OMNIPAQUE IOHEXOL 350 MG/ML SOLN  COMPARISON:  None.  FINDINGS: Mediastinum: Normal heart size. No pericardial  effusion identified. No mediastinal or hilar adenopathy. The trachea appears patent and is midline. Normal appearance of the esophagus. The main pulmonary artery appears normal. Single filling defect is identified within left lower lobe pulmonary artery, image 61 of series 4 and image 71 of series 8.  Lungs/Pleura: No pleural effusion identified. There is no airspace consolidation. Patchy areas of ground-glass attenuation in subsegmental atelectasis noted in the left lower lobe.  Upper Abdomen: Focal area of low attenuation along the dome of liver measures 7 mm, image 90/series 4. This is too small to characterize but likely represents a small cyst. Visualized portions of the spleen are normal. The adrenal glands are unremarkable.  Musculoskeletal: Review of the visualized osseous structures is negative. No aggressive lytic or sclerotic bone lesions noted.  Review of the MIP images confirms the above findings.  IMPRESSION: 1. Examination is positive for left lower lobe pulmonary embolus. There is associated patchy ground-glass  attenuation and subsegmental atelectasis within the left lower lobe.  Electronically Signed: By: Kerby Moors M.D. On: 08/07/2014 19:02    Microbiology: No results found for this or any previous visit (from the past 240 hour(s)).   Labs: Basic Metabolic Panel:  Recent Labs Lab 08/07/14 2010 08/08/14 0426  NA 138 141  K 4.2 3.9  CL 104 106  CO2 25 28  GLUCOSE 116* 106*  BUN 17 14  CREATININE 1.01 1.06  CALCIUM 9.3 9.0   Liver Function Tests:  Recent Labs Lab 08/08/14 0426  AST 16  ALT 20  ALKPHOS 42  BILITOT 0.6  PROT 6.3*  ALBUMIN 3.1*   No results for input(s): LIPASE, AMYLASE in the last 168 hours. No results for input(s): AMMONIA in the last 168 hours. CBC:  Recent Labs Lab 08/07/14 2010 08/08/14 0426  WBC 10.4 9.8  NEUTROABS 8.0*  --   HGB 15.5 13.9  HCT 46.1 40.9  MCV 89.9 89.3  PLT 207 193   Cardiac Enzymes: No results for input(s):  CKTOTAL, CKMB, CKMBINDEX, TROPONINI in the last 168 hours. BNP: BNP (last 3 results) No results for input(s): BNP in the last 8760 hours.  ProBNP (last 3 results) No results for input(s): PROBNP in the last 8760 hours.  CBG: No results for input(s): GLUCAP in the last 168 hours.     SignedEulogio Bear  [facility] 08/08/2014, 3:53 PM

## 2014-08-08 NOTE — Progress Notes (Signed)
UR COMPLETED  

## 2014-08-08 NOTE — Progress Notes (Deleted)
*  Preliminary Results* Bilateral lower extremity venous duplex completed. Bilateral lower extremities are negative for deep vein thrombosis. There is no evidence of Baker's cyst bilaterally.  08/08/2014  Maudry Mayhew, RVT, RDCS, RDMS

## 2014-08-09 ENCOUNTER — Encounter: Payer: Self-pay | Admitting: Family Medicine

## 2014-08-09 ENCOUNTER — Encounter: Payer: Self-pay | Admitting: Oncology

## 2014-08-09 NOTE — Progress Notes (Signed)
Patient ID: [place], male   DOB: November 10, 1945, 69 y.o.   MRN: [id number] I was called by Dr Lucianne Lei, hospitalist, on 08/08/14 to inform me that pt was hospitalized overnight with new, low volume, left lung pulmonary embolism, with a single filling defect in a lower lobe artery while on therapeutic Xarelto.There is an area of patchy, ground glass, attenuation in same area but no consolidation.  Patient's wife currently being Rxd for pneumonia.  Pt developed cough & dyspnea.  He has a dx of Protein S deficiency & prior right lower extremity DVT in June, 2010. He was on coumadin until February, 2015 when he was electively changed to [place]. I recommended that he go back on coumadin at this time at previous therapeutic dose: 2.5 mg Mon/Wed/Fri, 5 mg other days of the week. He started back with 5 mg last PM 6/7. I instructed him to continue 5 mg daily until INR checked on 6/13 then go on the chronic, maintenance schedule above. He will continue therapeutic lovenox until coumadin therapeutic. He plans to travel to [place] for his daughter's wedding on 6/14 so we will be cutting it close with his coumadin dosing. He has my cell phone number to contact me if needed for any last minute changes.

## 2014-08-10 ENCOUNTER — Ambulatory Visit: Payer: Medicare Other | Admitting: Family Medicine

## 2014-08-11 ENCOUNTER — Ambulatory Visit (INDEPENDENT_AMBULATORY_CARE_PROVIDER_SITE_OTHER): Payer: Medicare Other | Admitting: *Deleted

## 2014-08-11 VITALS — BP 132/85 | HR 60 | Temp 98.1°F | Resp 16 | Ht 71.0 in | Wt 160.0 lb

## 2014-08-11 DIAGNOSIS — I2782 Chronic pulmonary embolism: Secondary | ICD-10-CM

## 2014-08-11 DIAGNOSIS — Z5181 Encounter for therapeutic drug level monitoring: Secondary | ICD-10-CM

## 2014-08-11 LAB — POCT INR: INR: 1.6

## 2014-08-11 NOTE — Patient Instructions (Signed)
Take 5mg  daily until your appointment Monday.  Continue with the Lovenox.

## 2014-08-11 NOTE — Progress Notes (Signed)
Pre visit review using our clinic review tool, if applicable. No additional management support is needed unless otherwise documented below in the visit note. 

## 2014-08-14 ENCOUNTER — Encounter: Payer: Self-pay | Admitting: Oncology

## 2014-08-14 ENCOUNTER — Ambulatory Visit (INDEPENDENT_AMBULATORY_CARE_PROVIDER_SITE_OTHER): Payer: Medicare Other | Admitting: *Deleted

## 2014-08-14 VITALS — BP 128/72 | HR 80 | Temp 98.6°F | Resp 16 | Ht 71.0 in | Wt 160.0 lb

## 2014-08-14 DIAGNOSIS — I2699 Other pulmonary embolism without acute cor pulmonale: Secondary | ICD-10-CM | POA: Diagnosis not present

## 2014-08-14 LAB — POCT INR: INR: 2.9

## 2014-08-14 NOTE — Patient Instructions (Signed)
Take 5mg  Monday, Wednesday, and Friday.  Other days take 2.5mg .

## 2014-08-14 NOTE — Progress Notes (Signed)
Pre visit review using our clinic review tool, if applicable. No additional management support is needed unless otherwise documented below in the visit note. 

## 2014-08-15 ENCOUNTER — Other Ambulatory Visit: Payer: Self-pay | Admitting: Family Medicine

## 2014-08-15 MED ORDER — WARFARIN SODIUM 5 MG PO TABS: 5.0000 mg | ORAL_TABLET | Freq: Every day | ORAL | Status: DC

## 2014-08-15 NOTE — Telephone Encounter (Signed)
Coumadin refilled to local pharmacy.  Patient going out of town and out.

## 2014-09-05 ENCOUNTER — Telehealth: Payer: Self-pay | Admitting: Family Medicine

## 2014-09-05 ENCOUNTER — Ambulatory Visit (INDEPENDENT_AMBULATORY_CARE_PROVIDER_SITE_OTHER): Payer: Medicare Other | Admitting: Oncology

## 2014-09-05 ENCOUNTER — Encounter: Payer: Self-pay | Admitting: Oncology

## 2014-09-05 ENCOUNTER — Other Ambulatory Visit: Payer: Medicare Other

## 2014-09-05 ENCOUNTER — Other Ambulatory Visit: Payer: Self-pay | Admitting: Family Medicine

## 2014-09-05 VITALS — BP 109/72 | HR 63 | Temp 97.7°F | Resp 20 | Ht 70.0 in | Wt 162.6 lb

## 2014-09-05 DIAGNOSIS — D689 Coagulation defect, unspecified: Secondary | ICD-10-CM | POA: Diagnosis not present

## 2014-09-05 DIAGNOSIS — Z86718 Personal history of other venous thrombosis and embolism: Secondary | ICD-10-CM

## 2014-09-05 DIAGNOSIS — Z7901 Long term (current) use of anticoagulants: Secondary | ICD-10-CM | POA: Diagnosis not present

## 2014-09-05 DIAGNOSIS — D6859 Other primary thrombophilia: Secondary | ICD-10-CM | POA: Diagnosis not present

## 2014-09-05 DIAGNOSIS — E039 Hypothyroidism, unspecified: Secondary | ICD-10-CM

## 2014-09-05 DIAGNOSIS — I2699 Other pulmonary embolism without acute cor pulmonale: Secondary | ICD-10-CM

## 2014-09-05 DIAGNOSIS — Z86711 Personal history of pulmonary embolism: Secondary | ICD-10-CM

## 2014-09-05 DIAGNOSIS — G7249 Other inflammatory and immune myopathies, not elsewhere classified: Secondary | ICD-10-CM

## 2014-09-05 DIAGNOSIS — I82401 Acute embolism and thrombosis of unspecified deep veins of right lower extremity: Secondary | ICD-10-CM

## 2014-09-05 LAB — POCT INR: INR: 4.2

## 2014-09-05 MED ORDER — THYROID 60 MG PO TABS: 60.0000 mg | ORAL_TABLET | ORAL | Status: DC

## 2014-09-05 NOTE — Patient Instructions (Signed)
Skip 1 doe of coumadin today then change to  5 mg on Mon/Wed/Fri and 2.5 mg on Tues/Thurs/Sat/Sun Return in 2 weeks to check INR  MD visit 4-5 months

## 2014-09-05 NOTE — Telephone Encounter (Signed)
I sent in the requested change to the 60 mg dosage. Please let him know

## 2014-09-05 NOTE — Progress Notes (Signed)
Patient ID: Paul Ryan, male   DOB: Jul 13, 1945, 69 y.o.   MRN: [id number] Hematology and Oncology Follow Up Visit  [place] [id number] 10-20-1945 69 y.o. 09/05/2014 1:34 PM   Principle Diagnosis: Encounter Diagnoses  Name Primary?  . Long-term (current) use of anticoagulants Yes  . Pulmonary embolism   . Coagulopathy   . Hereditary protein S deficiency   . DVT (deep venous thrombosis), right   . 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 He sustained a non-provoked right lower extremity DVT in June 2010. He was initially anticoagulate with Coumadin for 1 year due to the fact that he travels frequently to [place]. Protein S M protein C levels were checked when he was off Coumadin and he had a reproducible decrease in functional protein S with borderline decrease in free protein S. He was put back on Coumadin. He was changed to Xarelto in April 2015 and did well until June 2016. He developed a bronchitis with a dry cough while he was visiting [place] and then dyspnea and pleuritic chest pain. CT scan of the chest done 08/07/2014 positive for left lower lobe pulmonary embolus with a single filling defect in a left lower lobe pulmonary artery. I elected to put him back on Coumadin at that time and stop the Xarelto. Of note since January 2016 he began to develop polymyalgia. I initially thought this was from the [place] but there was no improvement when Xarelto was temporarily discontinued. He had a significant elevation of ESR 84 mm. ANA negative, Rheumatoid factor negative. Muscle enzymes normal. ANCA negative. ACE level normal. Cyclic citrulline peptide antibodies undetectable. Sjogren antibodies undetectable. No improvement in symptoms off Crestor. He was put on a trial of prednisone by sports medicine physician Dr. Gardenia Ryan. Symptoms did improve. Findings were not felt to be typical of polymyalgia rheumatica and he remains under evaluation.   Interim  History:   He is still having dyspnea on exertion. No dyspnea at rest. No chest pain. He is still having polymyalgia but much less on the steroids. He just returned from [place] where his daughter got married. No health issues while he was traveling. He is back on Coumadin.  Medications: reviewed  Allergies: No Known Allergies  Review of Systems: See HPI Remaining ROS negative:   Physical Exam: Blood pressure 109/72, pulse 63, temperature 97.7 F (36.5 C), temperature source Oral, resp. rate 20, height $RemoveBe'5\' 10"'InJAApLPW$  (1.778 m), weight 162 lb 9.6 oz (73.755 kg), SpO2 99 %. Wt Readings from Last 3 Encounters:  09/05/14 162 lb 9.6 oz (73.755 kg)  08/14/14 160 lb (72.576 kg)  08/11/14 160 lb (72.576 kg)     General appearance: Thin Asian man HENNT: Pharynx no erythema, exudate, mass, or ulcer. No thyromegaly or thyroid nodules Lymph nodes: No cervical, supraclavicular, or axillary lymphadenopathy Breasts:  Lungs: Clear to auscultation, resonant to percussion throughout Heart: Regular rhythm, no murmur, no gallop, no rub, no click, no edema Abdomen: Soft, nontender, normal bowel sounds, no mass, no organomegaly Extremities: No edema, no calf tenderness; surgically absent left index finger Musculoskeletal: no joint deformities GU:  Vascular: Carotid pulses 2+, no bruits, distal pulses: Dorsalis pedis 1+ symmetric Neurologic: Alert, oriented, PERRLA, optic discs sharp and vessels normal, no hemorrhage or exudate, cranial nerves grossly normal, motor strength 5 over 5, reflexes 1+ symmetric, upper body coordination normal, gait normal, Skin: No rash or ecchymosis  Lab Results: CBC W/Diff    Component Value Date/Time   WBC 9.8 08/08/2014  0426   WBC 10.5* 05/17/2013 0902   RBC 4.58 08/08/2014 0426   RBC 4.99 05/17/2013 0902   HGB 13.9 08/08/2014 0426   HGB 15.2 05/17/2013 0902   HCT 40.9 08/08/2014 0426   HCT 44.6 05/17/2013 0902   PLT 193 08/08/2014 0426   PLT 181 05/17/2013 0902   MCV  89.3 08/08/2014 0426   MCV 89.4 05/17/2013 0902   MCH 30.3 08/08/2014 0426   MCH 30.5 05/17/2013 0902   MCHC 34.0 08/08/2014 0426   MCHC 34.1 05/17/2013 0902   RDW 13.5 08/08/2014 0426   RDW 13.7 05/17/2013 0902   LYMPHSABS 1.9 08/07/2014 2010   LYMPHSABS 1.7 05/17/2013 0902   MONOABS 0.5 08/07/2014 2010   MONOABS 0.4 05/17/2013 0902   EOSABS 0.1 08/07/2014 2010   EOSABS 0.0 05/17/2013 0902   BASOSABS 0.0 08/07/2014 2010   BASOSABS 0.0 05/17/2013 0902     Chemistry      Component Value Date/Time   NA 141 08/08/2014 0426   NA 143 11/16/2012 1145   K 3.9 08/08/2014 0426   K 4.4 11/16/2012 1145   CL 106 08/08/2014 0426   CO2 28 08/08/2014 0426   CO2 27 11/16/2012 1145   BUN 14 08/08/2014 0426   BUN 16.2 11/16/2012 1145   CREATININE 1.06 08/08/2014 0426   CREATININE 0.90 03/21/2014 1521   CREATININE 1.1 11/16/2012 1145      Component Value Date/Time   CALCIUM 9.0 08/08/2014 0426   CALCIUM 9.3 11/16/2012 1145   ALKPHOS 42 08/08/2014 0426   ALKPHOS 48 11/16/2012 1145   AST 16 08/08/2014 0426   AST 23 11/16/2012 1145   ALT 20 08/08/2014 0426   ALT 40 11/16/2012 1145   BILITOT 0.6 08/08/2014 0426   BILITOT 0.75 11/16/2012 1145       Radiological Studies: Ct Angio Chest W/cm &/or Wo Cm  08/07/2014 : See report in chart   Impression:  #1. 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 Status post right lower extremity DVT June 2010. Status post pulmonary embolus to a single left lower lobe pulmonary artery branch June 2016.  #3. Chronic anticoagulation secondary to #1 and #2  #4. Probable Xarelto failure now back on Coumadin INR today supratherapeutic at 4.2. He is advised to hold one dose. I am changing his Coumadin dose to 5 mg Monday Wednesdays and Fridays, 2.5 mg other days of the week. Repeat PT/INR in 2 weeks. He really liked the convenience of the Xarelto. We discussed this at length today. I have not ruled out the possibility that we could go back on  one of the novel oral anticoagulants but if we do my preference would be to put him on Pradaxa since it inhibits a different clotting factor then Xarelto and in my opinion, although I don't have data to support it, this may be better than going on another Xa inhibitor if we believe his pulmonary embolus occurred while on Xarelto.  #5. Unexplained inflammatory polymyopathy   CC: Patient Care Team: [name], MD as PCP - General (Family Medicine)   Annia Belt, MD 7/5/20161:34 PM

## 2014-09-05 NOTE — Telephone Encounter (Signed)
Pharmacy states patient is requesting a prescription that says he takes it every other day (alternating with 90 mg)

## 2014-09-06 ENCOUNTER — Other Ambulatory Visit: Payer: Self-pay | Admitting: Oncology

## 2014-09-06 ENCOUNTER — Encounter: Payer: Self-pay | Admitting: Family Medicine

## 2014-09-06 MED ORDER — THYROID 90 MG PO TABS: ORAL_TABLET | ORAL | Status: DC

## 2014-09-06 MED ORDER — WARFARIN SODIUM 5 MG PO TABS: ORAL_TABLET | ORAL | Status: DC

## 2014-09-06 NOTE — Telephone Encounter (Signed)
Pt.notified

## 2014-09-11 ENCOUNTER — Emergency Department (HOSPITAL_COMMUNITY): Payer: Medicare Other

## 2014-09-11 ENCOUNTER — Encounter (HOSPITAL_COMMUNITY): Payer: Self-pay | Admitting: *Deleted

## 2014-09-11 ENCOUNTER — Emergency Department (HOSPITAL_COMMUNITY)
Admission: EM | Admit: 2014-09-11 | Discharge: 2014-09-11 | Disposition: A | Discharge status: Home / Self Care | Payer: Medicare Other | Attending: Emergency Medicine | Admitting: Emergency Medicine

## 2014-09-11 DIAGNOSIS — Z87891 Personal history of nicotine dependence: Secondary | ICD-10-CM | POA: Diagnosis not present

## 2014-09-11 DIAGNOSIS — E785 Hyperlipidemia, unspecified: Secondary | ICD-10-CM | POA: Insufficient documentation

## 2014-09-11 DIAGNOSIS — Z86718 Personal history of other venous thrombosis and embolism: Secondary | ICD-10-CM | POA: Insufficient documentation

## 2014-09-11 DIAGNOSIS — E039 Hypothyroidism, unspecified: Secondary | ICD-10-CM | POA: Diagnosis not present

## 2014-09-11 DIAGNOSIS — Z79899 Other long term (current) drug therapy: Secondary | ICD-10-CM | POA: Diagnosis not present

## 2014-09-11 DIAGNOSIS — R05 Cough: Secondary | ICD-10-CM | POA: Diagnosis not present

## 2014-09-11 DIAGNOSIS — Z7901 Long term (current) use of anticoagulants: Secondary | ICD-10-CM | POA: Insufficient documentation

## 2014-09-11 DIAGNOSIS — E559 Vitamin D deficiency, unspecified: Secondary | ICD-10-CM | POA: Insufficient documentation

## 2014-09-11 DIAGNOSIS — R059 Cough, unspecified: Secondary | ICD-10-CM

## 2014-09-11 DIAGNOSIS — Z7952 Long term (current) use of systemic steroids: Secondary | ICD-10-CM | POA: Insufficient documentation

## 2014-09-11 HISTORY — DX: Systemic involvement of connective tissue, unspecified: M35.9

## 2014-09-11 LAB — PROTIME-INR
INR: 2.49 — ABNORMAL HIGH (ref 0.00–1.49)
Prothrombin Time: 26.6 seconds — ABNORMAL HIGH (ref 11.6–15.2)

## 2014-09-11 LAB — BASIC METABOLIC PANEL
ANION GAP: 8 (ref 5–15)
BUN: 16 mg/dL (ref 6–20)
CALCIUM: 9.3 mg/dL (ref 8.9–10.3)
CHLORIDE: 104 mmol/L (ref 101–111)
CO2: 26 mmol/L (ref 22–32)
Creatinine, Ser: 1.38 mg/dL — ABNORMAL HIGH (ref 0.61–1.24)
GFR calc Af Amer: 58 mL/min — ABNORMAL LOW (ref >60)
GFR, EST NON AFRICAN AMERICAN: 50 mL/min — AB (ref >60)
Glucose, Bld: 102 mg/dL — ABNORMAL HIGH (ref 65–99)
Potassium: 4.3 mmol/L (ref 3.5–5.1)
Sodium: 138 mmol/L (ref 135–145)

## 2014-09-11 LAB — CBC
HCT: 45 % (ref 39.0–52.0)
HEMOGLOBIN: 15.1 g/dL (ref 13.0–17.0)
MCH: 30.7 pg (ref 26.0–34.0)
MCHC: 33.6 g/dL (ref 30.0–36.0)
MCV: 91.5 fL (ref 78.0–100.0)
Platelets: 206 10*3/uL (ref 150–400)
RBC: 4.92 MIL/uL (ref 4.22–5.81)
RDW: 14 % (ref 11.5–15.5)
WBC: 8.8 10*3/uL (ref 4.0–10.5)

## 2014-09-11 MED ORDER — ALBUTEROL SULFATE HFA 108 (90 BASE) MCG/ACT IN AERS: 2.0000 | INHALATION_SPRAY | RESPIRATORY_TRACT | Status: DC | PRN

## 2014-09-11 MED ORDER — IOHEXOL 350 MG/ML SOLN
80.0000 mL | Freq: Once | INTRAVENOUS | Status: AC | PRN
  Administered 2014-09-11: 80 mL via INTRAVENOUS

## 2014-09-11 MED ORDER — BENZONATATE 100 MG PO CAPS: 100.0000 mg | ORAL_CAPSULE | Freq: Three times a day (TID) | ORAL | Status: DC

## 2014-09-11 NOTE — ED Provider Notes (Signed)
CSN: [id number]     Arrival date & time 09/11/14  1746 History   First MD Initiated Contact with Patient 09/11/14 1944     Chief Complaint  Patient presents with  . 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 Nothing makes this better or worse, no associated swelling of the legs, INR is therapeutic.  Patient is a 69 y.o. male presenting with cough. The history is provided by the patient.  Cough   Past Medical History  Diagnosis Date  . DVT (deep venous thrombosis)     takes coumadin  . Shingles 1982    right abdominal wall  . Allergy     seasonal  . Hyperlipidemia   . Left hip pain 12/17/2010  . Preventative health care 12/17/2010  . 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 Hypothyroid 06/10/2011  . Vitamin D deficiency 10/14/2011  . Dysuria 02/27/2013  . Chronic anticoagulation 06/14/2013  . Polymyalgia 03/21/2014  . Collagen vascular disease    Past Surgical History  Procedure Laterality Date  . 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 Cancer Mother     unknown- everywhere  . Cancer Sister     breast   History  Substance Use Topics  . Smoking status: Former Smoker -- 1.00 packs/day for 20 years    Types: Cigarettes    Quit date: 03/03/1980  . Smokeless tobacco: Never Used  . Alcohol Use: No    Review of Systems  Respiratory: Positive for  cough.   All other systems reviewed and are negative.     Allergies  Review of patient's allergies indicates no known allergies.  Home Medications   Prior to Admission medications   Medication Sig Start Date End Date Taking? Authorizing Provider  albuterol (PROVENTIL HFA;VENTOLIN HFA) 108 (90 BASE) MCG/ACT inhaler Inhale 2 puffs into the lungs every 4 (four) hours as needed for wheezing or shortness of breath. 09/11/14   Noemi Chapel, MD  b complex vitamins tablet Take 1 tablet by mouth daily.      Historical Provider, MD  benzonatate (TESSALON) 100 MG capsule Take 1 capsule (100 mg total) by mouth every 8 (eight) hours. 09/11/14   Noemi Chapel, MD  Cholecalciferol 4000 UNITS TABS Take 1 tablet by mouth daily.    Historical Provider, MD  Coenzyme Q10 (COQ10 PO) Take 2 tablets by mouth daily.    Historical Provider, MD  enoxaparin (LOVENOX) 100 MG/ML injection Inject 1 mL (100 mg total) into the skin daily. 08/08/14   Geradine Girt, DO  HYDROcodone-acetaminophen (NORCO) 7.5-325 MG per tablet 1 tablet every 6 (six) hours as needed (pain).  05/13/14   Historical Provider, MD  Astrid Drafts 300 MG CAPS 1 cap daily 10/12/12   [name], MD  NIACIN PO Take 1 tablet by mouth 3 (three) times daily.     Historical Provider, MD  predniSONE (DELTASONE) 5  MG tablet Take 1 tablet (5 mg total) by mouth daily with breakfast. 08/08/14   Geradine Girt, DO  tacrolimus (PROTOPIC) 0.1 % ointment Apply topically 2 (two) times daily. 02/22/13   [name], MD  thyroid [facility] THYROID) 60 MG tablet Take 1 tablet (60 mg total) by mouth every other day. Alternates with 90mg  every other day. 09/05/14   [name], MD  thyroid [facility] THYROID) 90 MG tablet Alternate this with Armour 60 mg 1 tab qod 09/06/14   [name], MD  warfarin (COUMADIN) 5 MG tablet 5 mg Mon/Wed/Fri; 2.5 mg other days of the week 09/06/14   Annia Belt, MD   BP 117/82 mmHg  Pulse 62  Resp 16  Ht 5\' 11"  (8.144 m)  Wt 165 lb  (74.844 kg)  BMI 23.02 kg/m2  SpO2 95% Physical Exam  Constitutional: He appears well-developed and well-nourished. No distress.  HENT:  Head: Normocephalic and atraumatic.  Mouth/Throat: Oropharynx is clear and moist. No oropharyngeal exudate.  Eyes: Conjunctivae and EOM are normal. Pupils are equal, round, and reactive to light. Right eye exhibits no discharge. Left eye exhibits no discharge. No scleral icterus.  Neck: Normal range of motion. Neck supple. No JVD present. No thyromegaly present.  Cardiovascular: Normal rate, regular rhythm, normal heart sounds and intact distal pulses.  Exam reveals no gallop and no friction rub.   No murmur heard. Pulmonary/Chest: Effort normal and breath sounds normal. No respiratory distress. He has no wheezes. He has no rales.  Occasional dry cough throughout exam, no increased work of breathing  Abdominal: Soft. Bowel sounds are normal. He exhibits no distension and no mass. There is no tenderness.  Musculoskeletal: Normal range of motion. He exhibits no edema or tenderness.  Lymphadenopathy:    He has no cervical adenopathy.  Neurological: He is alert. Coordination normal.  Skin: Skin is warm and dry. No rash noted. No erythema.  Psychiatric: He has a normal mood and affect. His behavior is normal.  Nursing note and vitals reviewed.   ED Course  Procedures (including critical care time) Labs Review Labs Reviewed  BASIC METABOLIC PANEL - Abnormal; Notable for the following:    Glucose, Bld 102 (*)    Creatinine, Ser 1.38 (*)    GFR calc non Af Amer 50 (*)    GFR calc Af Amer 58 (*)    All other components within normal limits  PROTIME-INR - Abnormal; Notable for the following:    Prothrombin Time 26.6 (*)    INR 2.49 (*)    All other components within normal limits  CBC    Imaging Review Ct Angio Chest Pe W/cm &/or Wo Cm  09/11/2014   CLINICAL DATA:  69 year old male with history of PVT presenting with trying cough  EXAM: CT  ANGIOGRAPHY CHEST WITH CONTRAST  TECHNIQUE: Multidetector CT imaging of the chest was performed using the standard protocol during bolus administration of intravenous contrast. Multiplanar CT image reconstructions and MIPs were obtained to evaluate the vascular anatomy.  CONTRAST:  55mL OMNIPAQUE IOHEXOL 350 MG/ML SOLN  COMPARISON:  CT dated 08/07/2014  FINDINGS: Minimal bibasilar dependent atelectatic changes. No focal consolidation, pleural effusion, or pneumothorax. The central airways are patent.  The thoracic aorta is unremarkable. Stable appearing left lower lobes subsegmental pulmonary embolus. No new embolus identified. Top-normal cardiac size. No pericardial effusion. There is coronary vascular calcification. No hilar or mediastinal adenopathy. Mild degenerative changes of the spine.  Review of the MIP images  confirms the above findings.  IMPRESSION: Stable left lower lobe subsegmental pulmonary artery embolus. No immediate embolus identified.   Electronically Signed   By: Anner Crete M.D.   On: 09/11/2014 21:28     MDM   Final diagnoses:  Cough    Vital signs are unremarkable, the patient is concerned for worsening pulmonary embolism given his history, we will obtain CT scan of the chest to rule out larger bulky embolism though the patient is aware that if he does have a small embolism or unchanged and prior study that no medication changes would be likely. He is in total agreement with this plan.  CT unchanged - no pna - pt stable for d/c.  Meds given in ED:  Medications  iohexol (OMNIPAQUE) 350 MG/ML injection 80 mL (80 mLs Intravenous Contrast Given 09/11/14 2109)    New Prescriptions   ALBUTEROL (PROVENTIL HFA;VENTOLIN HFA) 108 (90 BASE) MCG/ACT INHALER    Inhale 2 puffs into the lungs every 4 (four) hours as needed for wheezing or shortness of breath.   BENZONATATE (TESSALON) 100 MG CAPSULE    Take 1 capsule (100 mg total) by mouth every 8 (eight) hours.      Noemi Chapel, MD 09/11/14 2239

## 2014-09-11 NOTE — ED Notes (Addendum)
Pt c/o dry cough for two days. Pt denies fever, chills, n/v/d. Pt was positive for a PE in right lung four weeks ago. Pt flew to [place] last week. Pt was taken off Xeralto and started on Coumadin 3 weeks ago

## 2014-09-11 NOTE — Discharge Instructions (Signed)
Please call your doctor for a followup appointment within 24-48 hours. When you talk to your doctor please let them know that you were seen in the emergency department and have them acquire all of your records so that they can discuss the findings with you and formulate a treatment plan to fully care for your new and ongoing problems. ° °

## 2014-09-15 ENCOUNTER — Encounter: Payer: Self-pay | Admitting: Family Medicine

## 2014-09-18 ENCOUNTER — Other Ambulatory Visit: Payer: Self-pay | Admitting: Family Medicine

## 2014-09-18 MED ORDER — MONTELUKAST SODIUM 10 MG PO TABS: 10.0000 mg | ORAL_TABLET | Freq: Every day | ORAL | Status: DC

## 2014-09-19 ENCOUNTER — Other Ambulatory Visit (INDEPENDENT_AMBULATORY_CARE_PROVIDER_SITE_OTHER): Payer: Medicare Other

## 2014-09-19 ENCOUNTER — Other Ambulatory Visit: Payer: Self-pay | Admitting: Family Medicine

## 2014-09-19 ENCOUNTER — Other Ambulatory Visit: Payer: Self-pay | Admitting: Oncology

## 2014-09-19 DIAGNOSIS — Z7901 Long term (current) use of anticoagulants: Secondary | ICD-10-CM

## 2014-09-19 DIAGNOSIS — D6859 Other primary thrombophilia: Secondary | ICD-10-CM

## 2014-09-19 DIAGNOSIS — I2699 Other pulmonary embolism without acute cor pulmonale: Secondary | ICD-10-CM | POA: Diagnosis not present

## 2014-09-19 DIAGNOSIS — I82401 Acute embolism and thrombosis of unspecified deep veins of right lower extremity: Secondary | ICD-10-CM

## 2014-09-19 DIAGNOSIS — D689 Coagulation defect, unspecified: Secondary | ICD-10-CM

## 2014-09-19 LAB — D-DIMER, QUANTITATIVE: D-Dimer, Quant: 1.03 ug/mL-FEU — ABNORMAL HIGH (ref 0.00–0.48)

## 2014-09-19 LAB — CBC WITH DIFFERENTIAL/PLATELET
BASOS ABS: 0 10*3/uL (ref 0.0–0.1)
BASOS PCT: 0 % (ref 0–1)
Eosinophils Absolute: 0.1 10*3/uL (ref 0.0–0.7)
Eosinophils Relative: 1 % (ref 0–5)
HEMATOCRIT: 44.2 % (ref 39.0–52.0)
HEMOGLOBIN: 15.1 g/dL (ref 13.0–17.0)
LYMPHS PCT: 25 % (ref 12–46)
Lymphs Abs: 2.2 10*3/uL (ref 0.7–4.0)
MCH: 31.1 pg (ref 26.0–34.0)
MCHC: 34.2 g/dL (ref 30.0–36.0)
MCV: 90.9 fL (ref 78.0–100.0)
MONOS PCT: 7 % (ref 3–12)
Monocytes Absolute: 0.6 10*3/uL (ref 0.1–1.0)
NEUTROS PCT: 67 % (ref 43–77)
Neutro Abs: 6.2 10*3/uL (ref 1.7–7.7)
Platelets: 213 10*3/uL (ref 150–400)
RBC: 4.86 MIL/uL (ref 4.22–5.81)
RDW: 13.8 % (ref 11.5–15.5)
WBC: 9.1 10*3/uL (ref 4.0–10.5)

## 2014-09-19 LAB — PROTIME-INR
INR: 3.17 — AB (ref 0.00–1.49)
Prothrombin Time: 31.9 seconds — ABNORMAL HIGH (ref 11.6–15.2)

## 2014-09-19 LAB — APTT: aPTT: 37 seconds (ref 24–37)

## 2014-09-22 ENCOUNTER — Telehealth: Payer: Self-pay | Admitting: *Deleted

## 2014-09-22 NOTE — Telephone Encounter (Signed)
-----   Message from Annia Belt, MD sent at 09/19/2014  5:09 PM EDT ----- Call pt: INR 3.2. Change coumadin to 5 mg on Mondays & Thursdays, 2.5 mg other days of the week. Repeat PT/INR here in 2 weeks on August 2

## 2014-09-22 NOTE — Telephone Encounter (Signed)
Pt called / informed INR is 3.2 and to change Coumadin to 5 mg on Monday/Thursday and 2.5 mg on the other days per [name]. Also need to repeat PT/INR on August 2; appt scheduled on this date @ 49AM.

## 2014-09-25 ENCOUNTER — Other Ambulatory Visit: Payer: Self-pay | Admitting: Oncology

## 2014-09-25 MED ORDER — WARFARIN SODIUM 5 MG PO TABS: ORAL_TABLET | ORAL | Status: DC

## 2014-10-03 ENCOUNTER — Other Ambulatory Visit (INDEPENDENT_AMBULATORY_CARE_PROVIDER_SITE_OTHER): Payer: Medicare Other

## 2014-10-03 DIAGNOSIS — Z7901 Long term (current) use of anticoagulants: Secondary | ICD-10-CM

## 2014-10-03 LAB — POCT INR: INR: 2.2

## 2014-10-04 ENCOUNTER — Telehealth: Payer: Self-pay | Admitting: *Deleted

## 2014-10-04 NOTE — Telephone Encounter (Signed)
Pt called / informed INR is 2.2 and to continue current dose of Coumadin per [name]. Lab appt scheduled on 9/2 @ 0930AM.

## 2014-10-04 NOTE — Telephone Encounter (Signed)
-----   Message from Annia Belt, MD sent at 10/03/2014  4:07 PM EDT ----- Call pt: INR 2.2  Stay on current dose of coumadin.  Check again in 1 month

## 2014-10-09 ENCOUNTER — Encounter: Payer: Self-pay | Admitting: Family Medicine

## 2014-10-11 ENCOUNTER — Other Ambulatory Visit: Payer: Self-pay | Admitting: Family Medicine

## 2014-10-11 MED ORDER — SCOPOLAMINE 1 MG/3DAYS TD PT72: 1.0000 | MEDICATED_PATCH | TRANSDERMAL | Status: DC

## 2014-10-26 ENCOUNTER — Encounter: Payer: Self-pay | Admitting: Family Medicine

## 2014-10-26 DIAGNOSIS — M353 Polymyalgia rheumatica: Secondary | ICD-10-CM

## 2014-11-03 ENCOUNTER — Telehealth: Payer: Self-pay | Admitting: *Deleted

## 2014-11-03 ENCOUNTER — Other Ambulatory Visit (INDEPENDENT_AMBULATORY_CARE_PROVIDER_SITE_OTHER): Payer: Medicare Other

## 2014-11-03 DIAGNOSIS — Z7901 Long term (current) use of anticoagulants: Secondary | ICD-10-CM | POA: Diagnosis not present

## 2014-11-03 LAB — POCT INR: INR: 2.8

## 2014-11-03 NOTE — Telephone Encounter (Signed)
Pt called / informed INR 2.8 and to continue current dose of Coumadin per [name]. He's going out of town x 3 weeks starting 9/25; appt scheduled 9/23 @ 0930AM.

## 2014-11-03 NOTE — Telephone Encounter (Signed)
-----   Message from Annia Belt, MD sent at 11/03/2014  3:17 PM EDT ----- Call pt: INR 2.8 continue current dose of coumadin Repeat lab 1 month

## 2014-11-07 ENCOUNTER — Encounter: Payer: Self-pay | Admitting: Family Medicine

## 2014-11-13 DIAGNOSIS — H2511 Age-related nuclear cataract, right eye: Secondary | ICD-10-CM | POA: Diagnosis not present

## 2014-11-16 ENCOUNTER — Encounter: Payer: Self-pay | Admitting: Family Medicine

## 2014-11-16 ENCOUNTER — Ambulatory Visit (INDEPENDENT_AMBULATORY_CARE_PROVIDER_SITE_OTHER): Payer: Medicare Other | Admitting: Family Medicine

## 2014-11-16 VITALS — BP 114/76 | HR 74 | Temp 99.7°F | Wt 162.4 lb

## 2014-11-16 DIAGNOSIS — J029 Acute pharyngitis, unspecified: Secondary | ICD-10-CM

## 2014-11-16 LAB — POCT RAPID STREP A (OFFICE): RAPID STREP A SCREEN: NEGATIVE

## 2014-11-16 MED ORDER — AMOXICILLIN 875 MG PO TABS: 875.0000 mg | ORAL_TABLET | Freq: Two times a day (BID) | ORAL | Status: DC

## 2014-11-16 MED ORDER — PREDNISONE 10 MG PO TABS: 10.0000 mg | ORAL_TABLET | Freq: Every day | ORAL | Status: DC

## 2014-11-16 NOTE — Progress Notes (Signed)
Pre visit review using our clinic review tool, if applicable. No additional management support is needed unless otherwise documented below in the visit note. 

## 2014-11-16 NOTE — Patient Instructions (Signed)

## 2014-11-16 NOTE — Progress Notes (Signed)
Patient ID: Paul Ryan, male   DOB: [birth year], 69 y.o.   MRN: [id number]   Subjective:    Patient ID: Paul Ryan, male    DOB: 1945/05/07, 69 y.o.   MRN: [id number]  Chief Complaint  Patient presents with  . Sore Throat    x's 1 day    HPI Patient is in today for sore throat.  He has a sharp pain that shoot up to ear with eating.  No fever.  No congestion.  Past Medical History  Diagnosis Date  . DVT (deep venous thrombosis)     takes coumadin  . Shingles 1982    right abdominal wall  . Allergy     seasonal  . Hyperlipidemia   . Left hip pain 12/17/2010  . Preventative health care 12/17/2010  . 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 Hypothyroid 06/10/2011  . Vitamin D deficiency 10/14/2011  . Dysuria 02/27/2013  . Chronic anticoagulation 06/14/2013  . Polymyalgia 03/21/2014  . Collagen vascular disease     Past Surgical History  Procedure Laterality Date  . 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 Cancer Mother     unknown- everywhere  . Cancer Sister     breast    Social History   Social History  . Marital Status: Married    Spouse Name: N/A  . Number of Children: N/A  . Years of Education: N/A   Occupational History  . Not on file.   Social History Main Topics  . Smoking status: Former Smoker -- 1.00 packs/day for 20 years    Types: Cigarettes    Quit date: 03/03/1980  . Smokeless tobacco: Never Used  . Alcohol Use: No  . Drug Use: No  . Sexual Activity: Not on file   Other Topics Concern  . Not on file   Social History Narrative    Outpatient Prescriptions Prior to Visit  Medication Sig Dispense Refill  . albuterol (PROVENTIL HFA;VENTOLIN HFA) 108 (90 BASE) MCG/ACT inhaler Inhale 2 puffs into the lungs every 4 (four) hours as needed for wheezing or shortness of breath. 1 Inhaler 3  . b complex vitamins tablet Take 1 tablet by mouth daily.      .  Cholecalciferol 4000 UNITS TABS Take 1 tablet by mouth daily.    . Coenzyme Q10 (COQ10 PO) Take 2 tablets by mouth daily.    [name] Kitchen HYDROcodone-acetaminophen (NORCO) 7.5-325 MG per tablet 1 tablet every 6 (six) hours as needed (pain).     Javier Docker Oil 300 MG CAPS 1 cap daily    . montelukast (SINGULAIR) 10 MG tablet Take 1 tablet (10 mg total) by mouth at bedtime. 30 tablet 3  . NIACIN PO Take 1 tablet by mouth 3 (three) times daily.     [name] Kitchen scopolamine (TRANSDERM-SCOP, 1.5 MG,) 1 MG/3DAYS Place 1 patch (1.5 mg total) onto the skin every 3 (three) days. 2 patch 0  . tacrolimus (PROTOPIC) 0.1 % ointment Apply topically 2 (two) times daily. 100 g 1  . thyroid (ARMOUR THYROID) 60 MG tablet Take 1 tablet (60 mg total) by mouth every other day. Alternates with 90mg  every other day. 45 tablet 1  . thyroid (ARMOUR THYROID) 90 MG tablet Alternate this with Armour 60 mg 1 tab qod 45 tablet 6  . warfarin (COUMADIN) 5 MG tablet TAKE 1 TABLET BY MOUTH EVERY DAY  AT 6 PM 30 tablet 0  . benzonatate (TESSALON) 100 MG capsule Take 1 capsule (100 mg total) by mouth every 8 (eight) hours. 21 capsule 0  . enoxaparin (LOVENOX) 100 MG/ML injection Inject 1 mL (100 mg total) into the skin daily. 7 Syringe 0  . predniSONE (DELTASONE) 5 MG tablet Take 1 tablet (5 mg total) by mouth daily with breakfast.    . warfarin (COUMADIN) 5 MG tablet 5 mg Mondays & Thursdays;  2.5 mg other days of the week 30 tablet 11   No facility-administered medications prior to visit.    No Known Allergies  Review of Systems  Constitutional: Negative for fever and malaise/fatigue.  HENT: Positive for ear pain and sore throat. Negative for congestion.   Eyes: Negative for discharge.  Respiratory: Negative for shortness of breath.   Cardiovascular: Negative for chest pain, palpitations and leg swelling.  Gastrointestinal: Negative for nausea and abdominal pain.  Genitourinary: Negative for dysuria.  Musculoskeletal: Negative for falls.  Skin:  Negative for rash.  Neurological: Negative for loss of consciousness and headaches.  Endo/Heme/Allergies: Negative for environmental allergies.  Psychiatric/Behavioral: Negative for depression. The patient is not nervous/anxious.        Objective:    Physical Exam  Constitutional: He is oriented to person, place, and time. Vital signs are normal. He appears well-developed and well-nourished. He is sleeping.  HENT:  Head: Normocephalic and atraumatic.  Right Ear: Hearing, tympanic membrane, external ear and ear canal normal.  Left Ear: Hearing, tympanic membrane, external ear and ear canal normal.  Nose: No rhinorrhea.  Mouth/Throat: Posterior oropharyngeal erythema present. No posterior oropharyngeal edema.  Eyes: EOM are normal. Pupils are equal, round, and reactive to light.  Neck: Normal range of motion. Neck supple. No thyromegaly present.  Cardiovascular: Normal rate and regular rhythm.   No murmur heard. Pulmonary/Chest: Effort normal and breath sounds normal. No respiratory distress. He has no wheezes. He has no rales. He exhibits no tenderness.  Musculoskeletal: He exhibits no edema or tenderness.  Lymphadenopathy:    He has cervical adenopathy.  Neurological: He is alert and oriented to person, place, and time.  Skin: Skin is warm and dry.  Psychiatric: He has a normal mood and affect. His behavior is normal. Judgment and thought content normal.    BP 114/76 mmHg  Pulse 74  Temp(Src) 99.7 F (37.6 C) (Oral)  Wt 162 lb 6.4 oz (73.664 kg)  SpO2 96% Wt Readings from Last 3 Encounters:  11/16/14 162 lb 6.4 oz (73.664 kg)  09/11/14 165 lb (74.844 kg)  09/05/14 162 lb 9.6 oz (73.755 kg)     Lab Results  Component Value Date   WBC 9.1 09/19/2014   HGB 15.1 09/19/2014   HCT 44.2 09/19/2014   PLT 213 09/19/2014   GLUCOSE 102* 09/11/2014   CHOL 231* 07/28/2014   TRIG 73.0 07/28/2014   HDL 44.30 07/28/2014   LDLDIRECT 158.0 03/11/2013   LDLCALC 172* 07/28/2014    ALT 20 08/08/2014   AST 16 08/08/2014   NA 138 09/11/2014   K 4.3 09/11/2014   CL 104 09/11/2014   CREATININE 1.38* 09/11/2014   BUN 16 09/11/2014   CO2 26 09/11/2014   TSH 1.20 07/28/2014   PSA 1.63 10/14/2011   INR 2.8 11/03/2014   HGBA1C 6.3 03/11/2013    Lab Results  Component Value Date   TSH 1.20 07/28/2014   Lab Results  Component Value Date   WBC 9.1 09/19/2014   HGB 15.1  09/19/2014   HCT 44.2 09/19/2014   MCV 90.9 09/19/2014   PLT 213 09/19/2014   Lab Results  Component Value Date   NA 138 09/11/2014   K 4.3 09/11/2014   CHLORIDE 109 11/16/2012   CO2 26 09/11/2014   GLUCOSE 102* 09/11/2014   BUN 16 09/11/2014   CREATININE 1.38* 09/11/2014   BILITOT 0.6 08/08/2014   ALKPHOS 42 08/08/2014   AST 16 08/08/2014   ALT 20 08/08/2014   PROT 6.3* 08/08/2014   ALBUMIN 3.1* 08/08/2014   CALCIUM 9.3 09/11/2014   ANIONGAP 8 09/11/2014   GFR 71.10 07/28/2014   Lab Results  Component Value Date   CHOL 231* 07/28/2014   Lab Results  Component Value Date   HDL 44.30 07/28/2014   Lab Results  Component Value Date   LDLCALC 172* 07/28/2014   Lab Results  Component Value Date   TRIG 73.0 07/28/2014   Lab Results  Component Value Date   CHOLHDL 5 07/28/2014   Lab Results  Component Value Date   HGBA1C 6.3 03/11/2013       Assessment & Plan:   Problem List Items Addressed This Visit    None    Visit Diagnoses    Sore throat    -  Primary    Relevant Orders    POCT rapid strep A (Completed)    Culture, Group A Strep     amoxicillin 875 bid x 10 days Antihistamine and flonase  I have discontinued Mr. Horrigan predniSONE, enoxaparin, and benzonatate. I am also having him start on amoxicillin and predniSONE. Additionally, I am having him maintain his b complex vitamins, Krill Oil, tacrolimus, NIACIN PO, HYDROcodone-acetaminophen, Cholecalciferol, Coenzyme Q10 (COQ10 PO), thyroid, thyroid, albuterol, montelukast, warfarin, scopolamine,  trimethoprim-polymyxin b, DUREZOL, and Bromfenac Sodium.  Meds ordered this encounter  Medications  . trimethoprim-polymyxin b (POLYTRIM) ophthalmic solution    Sig: INSTILL 1 DROP TO OPERATED EYE 3 TIMES DAILY STARTING THE DAY OF SURGERY. TAPER AS DIRECTED.    Refill:  2  . DUREZOL 0.05 % EMUL    Sig: INSTILL 1 DROP TO OPERATED EYE 3 TIMES DAILY STARTING THE DAY OF SURGERY. TAPER AS DIRECTED.    Refill:  2  . Bromfenac Sodium 0.09 % SOLN    Sig: INSTILL 1 DROP INTO OPERATIVE EYE 2 TIMES DAILY STARTING THE DAY OF SURGERY UNTIL BOTTLE IS EMPTY    Refill:  2  . amoxicillin (AMOXIL) 875 MG tablet    Sig: Take 1 tablet (875 mg total) by mouth 2 (two) times daily.    Dispense:  20 tablet    Refill:  0  . predniSONE (DELTASONE) 10 MG tablet    Sig: Take 1 tablet (10 mg total) by mouth daily with breakfast.    Dispense:  30 tablet    Refill:  0     Garnet Koyanagi, DO

## 2014-11-18 LAB — CULTURE, GROUP A STREP: Organism ID, Bacteria: NORMAL

## 2014-11-21 ENCOUNTER — Encounter: Payer: Self-pay | Admitting: Family Medicine

## 2014-11-21 ENCOUNTER — Ambulatory Visit (INDEPENDENT_AMBULATORY_CARE_PROVIDER_SITE_OTHER): Payer: Medicare Other | Admitting: Family Medicine

## 2014-11-21 VITALS — BP 118/78 | HR 63 | Temp 97.9°F | Ht 71.0 in | Wt 160.1 lb

## 2014-11-21 DIAGNOSIS — E785 Hyperlipidemia, unspecified: Secondary | ICD-10-CM

## 2014-11-21 DIAGNOSIS — R059 Cough, unspecified: Secondary | ICD-10-CM

## 2014-11-21 DIAGNOSIS — E041 Nontoxic single thyroid nodule: Secondary | ICD-10-CM

## 2014-11-21 DIAGNOSIS — I2699 Other pulmonary embolism without acute cor pulmonale: Secondary | ICD-10-CM

## 2014-11-21 DIAGNOSIS — R05 Cough: Secondary | ICD-10-CM

## 2014-11-21 DIAGNOSIS — E039 Hypothyroidism, unspecified: Secondary | ICD-10-CM | POA: Diagnosis not present

## 2014-11-21 DIAGNOSIS — R42 Dizziness and giddiness: Secondary | ICD-10-CM

## 2014-11-21 LAB — LIPID PANEL
CHOLESTEROL: 245 mg/dL — AB (ref 0–200)
HDL: 37.1 mg/dL — AB (ref >39.00)
LDL CALC: 191 mg/dL — AB (ref 0–99)
NONHDL: 208.27
Total CHOL/HDL Ratio: 7
Triglycerides: 86 mg/dL (ref 0.0–149.0)
VLDL: 17.2 mg/dL (ref 0.0–40.0)

## 2014-11-21 LAB — COMPREHENSIVE METABOLIC PANEL
ALBUMIN: 4 g/dL (ref 3.5–5.2)
ALK PHOS: 50 U/L (ref 39–117)
ALT: 28 U/L (ref 0–53)
AST: 19 U/L (ref 0–37)
BUN: 15 mg/dL (ref 6–23)
CALCIUM: 9.7 mg/dL (ref 8.4–10.5)
CHLORIDE: 103 meq/L (ref 96–112)
CO2: 27 mEq/L (ref 19–32)
Creatinine, Ser: 0.95 mg/dL (ref 0.40–1.50)
GFR: 83.25 mL/min (ref >60.00)
Glucose, Bld: 108 mg/dL — ABNORMAL HIGH (ref 70–99)
POTASSIUM: 3.7 meq/L (ref 3.5–5.1)
Sodium: 140 mEq/L (ref 135–145)
TOTAL PROTEIN: 8.2 g/dL (ref 6.0–8.3)
Total Bilirubin: 0.6 mg/dL (ref 0.2–1.2)

## 2014-11-21 LAB — CBC
HEMATOCRIT: 45.5 % (ref 39.0–52.0)
HEMOGLOBIN: 15.1 g/dL (ref 13.0–17.0)
MCHC: 33.2 g/dL (ref 30.0–36.0)
MCV: 92.1 fl (ref 78.0–100.0)
Platelets: 266 10*3/uL (ref 150.0–400.0)
RBC: 4.95 Mil/uL (ref 4.22–5.81)
RDW: 13.9 % (ref 11.5–15.5)
WBC: 8 10*3/uL (ref 4.0–10.5)

## 2014-11-21 LAB — SEDIMENTATION RATE: Sed Rate: 82 mm/hr — ABNORMAL HIGH (ref 0–22)

## 2014-11-21 LAB — PROTIME-INR
INR: 2.8 ratio — AB (ref 0.8–1.0)
Prothrombin Time: 30.7 s — ABNORMAL HIGH (ref 9.6–13.1)

## 2014-11-21 LAB — TSH: TSH: 0.47 u[IU]/mL (ref 0.35–4.50)

## 2014-11-21 MED ORDER — VALACYCLOVIR HCL 1 G PO TABS: 1000.0000 mg | ORAL_TABLET | Freq: Three times a day (TID) | ORAL | Status: DC

## 2014-11-21 MED ORDER — THYROID 90 MG PO TABS
ORAL_TABLET | ORAL | Status: DC
Start: 2014-11-21 — End: 2015-04-16

## 2014-11-21 MED ORDER — THYROID 60 MG PO TABS: 60.0000 mg | ORAL_TABLET | ORAL | Status: DC

## 2014-11-21 NOTE — Patient Instructions (Addendum)
Valtrex in case of another break out 1 pill 3 times a day for 10 days Tessalon Pearls can take up to twice a day to sooth the back of throat during coughs Zyrtec daily for 1 week as needed Singulair every day Vitamin C, zinc, echinacea and elderberry to help with immune system. Ginger chews from whole foods for nausea  Allergies Allergies may happen from anything your body is sensitive to. This may be food, medicines, pollens, chemicals, and nearly anything around you in everyday life that produces allergens. An allergen is anything that causes an allergy producing substance. Heredity is often a factor in causing these problems. This means you may have some of the same allergies as your parents. Food allergies happen in all age groups. Food allergies are some of the most severe and life threatening. Some common food allergies are cow's milk, seafood, eggs, nuts, wheat, and soybeans. SYMPTOMS   Swelling around the mouth.  An itchy red rash or hives.  Vomiting or diarrhea.  Difficulty breathing. SEVERE ALLERGIC REACTIONS ARE LIFE-THREATENING. This reaction is called anaphylaxis. It can cause the mouth and throat to swell and cause difficulty with breathing and swallowing. In severe reactions only a trace amount of food (for example, peanut oil in a salad) may cause death within seconds. Seasonal allergies occur in all age groups. These are seasonal because they usually occur during the same season every year. They may be a reaction to molds, grass pollens, or tree pollens. Other causes of problems are house dust mite allergens, pet dander, and mold spores. The symptoms often consist of nasal congestion, a runny itchy nose associated with sneezing, and tearing itchy eyes. There is often an associated itching of the mouth and ears. The problems happen when you come in contact with pollens and other allergens. Allergens are the particles in the air that the body reacts to with an allergic reaction.  This causes you to release allergic antibodies. Through a chain of events, these eventually cause you to release histamine into the blood stream. Although it is meant to be protective to the body, it is this release that causes your discomfort. This is why you were given anti-histamines to feel better. If you are unable to pinpoint the offending allergen, it may be determined by skin or blood testing. Allergies cannot be cured but can be controlled with medicine. Hay fever is a collection of all or some of the seasonal allergy problems. It may often be treated with simple over-the-counter medicine such as diphenhydramine. Take medicine as directed. Do not drink alcohol or drive while taking this medicine. Check with your caregiver or package insert for child dosages. If these medicines are not effective, there are many new medicines your caregiver can prescribe. Stronger medicine such as nasal spray, eye drops, and corticosteroids may be used if the first things you try do not work well. Other treatments such as immunotherapy or desensitizing injections can be used if all else fails. Follow up with your caregiver if problems continue. These seasonal allergies are usually not life threatening. They are generally more of a nuisance that can often be handled using medicine. HOME CARE INSTRUCTIONS   If unsure what causes a reaction, keep a diary of foods eaten and symptoms that follow. Avoid foods that cause reactions.  If hives or rash are present:  Take medicine as directed.  You may use an over-the-counter antihistamine (diphenhydramine) for hives and itching as needed.  Apply cold compresses (cloths) to the skin or  take baths in cool water. Avoid hot baths or showers. Heat will make a rash and itching worse.  If you are severely allergic:  Following a treatment for a severe reaction, hospitalization is often required for closer follow-up.  Wear a medic-alert bracelet or necklace stating the  allergy.  You and your family must learn how to give adrenaline or use an anaphylaxis kit.  If you have had a severe reaction, always carry your anaphylaxis kit or EpiPen with you. Use this medicine as directed by your caregiver if a severe reaction is occurring. Failure to do so could have a fatal outcome. SEEK MEDICAL CARE IF:  You suspect a food allergy. Symptoms generally happen within 30 minutes of eating a food.  Your symptoms have not gone away within 2 days or are getting worse.  You develop new symptoms.  You want to retest yourself or your child with a food or drink you think causes an allergic reaction. Never do this if an anaphylactic reaction to that food or drink has happened before. Only do this under the care of a caregiver. SEEK IMMEDIATE MEDICAL CARE IF:   You have difficulty breathing, are wheezing, or have a tight feeling in your chest or throat.  You have a swollen mouth, or you have hives, swelling, or itching all over your body.  You have had a severe reaction that has responded to your anaphylaxis kit or an EpiPen. These reactions may return when the medicine has worn off. These reactions should be considered life threatening. MAKE SURE YOU:   Understand these instructions.  Will watch your condition.  Will get help right away if you are not doing well or get worse. Document Released: 05/13/2002 Document Revised: 06/14/2012 Document Reviewed: 10/18/2007 [facility] Patient Information 2015 [place], [place]. This information is not intended to replace advice given to you by your health care provider. Make sure you discuss any questions you have with your health care provider.

## 2014-11-21 NOTE — Progress Notes (Signed)
Pre visit review using our clinic review tool, if applicable. No additional management support is needed unless otherwise documented below in the visit note. 

## 2014-11-21 NOTE — Progress Notes (Signed)
Patient ID: Paul Ryan, male   DOB: 1945/10/29, 69 y.o.   MRN: [id number]   Subjective:    Patient ID: Paul Ryan, male    DOB: 12/09/1945, 69 y.o.   MRN: [id number]  Chief Complaint  Patient presents with  . Sore Throat    HPI Patient is in today for evaluation of sore throat, pulmonary embolism and fatigue. He is about to fly to [place]. He said the sore throat for about 10 days improving but the pain is sharp as on the right side. Assessment of buttocks and felt that helped slightly he is currently coughing with yellow phlegm. He continues to take Steri-Strips for his polymyalgia rheumatica. No other recent illness. Denies CP/palp/SOB/HA/congestion/fevers/GI or GU c/o. Taking meds as prescribed  Past Medical History  Diagnosis Date  . DVT (deep venous thrombosis)     takes coumadin  . Shingles 1982    right abdominal wall  . Allergy     seasonal  . Hyperlipidemia   . Left hip pain 12/17/2010  . Preventative health care 12/17/2010  . 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 Hypothyroid 06/10/2011  . Vitamin D deficiency 10/14/2011  . Dysuria 02/27/2013  . Chronic anticoagulation 06/14/2013  . Polymyalgia 03/21/2014  . Collagen vascular disease     Past Surgical History  Procedure Laterality Date  . 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 Cancer Mother     unknown- everywhere  . Cancer Sister     breast    Social History   Social History  . Marital Status: Married    Spouse Name: N/A  . Number of Children: N/A  . Years of Education: N/A   Occupational History  . Not on file.   Social History Main Topics  . Smoking status: Former Smoker -- 1.00 packs/day for 20 years    Types: Cigarettes    Quit date: 03/03/1980  . Smokeless tobacco: Never Used  . Alcohol Use: No  . Drug Use: No  . Sexual Activity: Not on file   Other Topics Concern  . Not on file   Social  History Narrative    Outpatient Prescriptions Prior to Visit  Medication Sig Dispense Refill  . albuterol (PROVENTIL HFA;VENTOLIN HFA) 108 (90 BASE) MCG/ACT inhaler Inhale 2 puffs into the lungs every 4 (four) hours as needed for wheezing or shortness of breath. 1 Inhaler 3  . b complex vitamins tablet Take 1 tablet by mouth daily.      . Bromfenac Sodium 0.09 % SOLN INSTILL 1 DROP INTO OPERATIVE EYE 2 TIMES DAILY STARTING THE DAY OF SURGERY UNTIL BOTTLE IS EMPTY  2  . Cholecalciferol 4000 UNITS TABS Take 1 tablet by mouth daily.    . Coenzyme Q10 (COQ10 PO) Take 2 tablets by mouth daily.    . DUREZOL 0.05 % EMUL INSTILL 1 DROP TO OPERATED EYE 3 TIMES DAILY STARTING THE DAY OF SURGERY. TAPER AS DIRECTED.  2  . Krill Oil 300 MG CAPS 1 cap daily    . NIACIN PO Take 1 tablet by mouth 3 (three) times daily.     . predniSONE (DELTASONE) 10 MG tablet Take 1 tablet (10 mg total) by mouth daily with breakfast. 30 tablet 0  . tacrolimus (PROTOPIC) 0.1 % ointment Apply topically 2 (two) times daily. 100 g 1  . trimethoprim-polymyxin b (POLYTRIM) ophthalmic solution INSTILL  1 DROP TO OPERATED EYE 3 TIMES DAILY STARTING THE DAY OF SURGERY. TAPER AS DIRECTED.  2  . warfarin (COUMADIN) 5 MG tablet TAKE 1 TABLET BY MOUTH EVERY DAY AT 6 PM 30 tablet 0  . thyroid (ARMOUR THYROID) 60 MG tablet Take 1 tablet (60 mg total) by mouth every other day. Alternates with 90mg  every other day. 45 tablet 1  . thyroid (ARMOUR THYROID) 90 MG tablet Alternate this with Armour 60 mg 1 tab qod 45 tablet 6  . montelukast (SINGULAIR) 10 MG tablet Take 1 tablet (10 mg total) by mouth at bedtime. (Patient not taking: Reported on 11/21/2014) 30 tablet 3  . scopolamine (TRANSDERM-SCOP, 1.5 MG,) 1 MG/3DAYS Place 1 patch (1.5 mg total) onto the skin every 3 (three) days. (Patient not taking: Reported on 11/21/2014) 2 patch 0  . amoxicillin (AMOXIL) 875 MG tablet Take 1 tablet (875 mg total) by mouth 2 (two) times daily. 20 tablet 0  .  HYDROcodone-acetaminophen (NORCO) 7.5-325 MG per tablet 1 tablet every 6 (six) hours as needed (pain).      No facility-administered medications prior to visit.    No Known Allergies  Review of Systems  Constitutional: Negative for fever and malaise/fatigue.  HENT: Negative for congestion.   Eyes: Negative for discharge.  Respiratory: Negative for shortness of breath.   Cardiovascular: Positive for orthopnea. Negative for chest pain, palpitations and leg swelling.  Gastrointestinal: Negative for nausea and abdominal pain.  Genitourinary: Negative for dysuria.  Musculoskeletal: Negative for falls.  Skin: Negative for rash.  Neurological: Negative for loss of consciousness and headaches.  Endo/Heme/Allergies: Negative for environmental allergies.  Psychiatric/Behavioral: Negative for depression. The patient is not nervous/anxious.        Objective:    Physical Exam  Constitutional: He is oriented to person, place, and time. He appears well-developed and well-nourished. No distress.  HENT:  Head: Normocephalic and atraumatic.  Eyes: Conjunctivae are normal.  Neck: Neck supple. No thyromegaly present.  Cardiovascular: Normal rate, regular rhythm and normal heart sounds.   No murmur heard. Pulmonary/Chest: Effort normal and breath sounds normal. No respiratory distress. He has no wheezes.  Abdominal: Soft. Bowel sounds are normal. He exhibits no mass. There is no tenderness.  Musculoskeletal: He exhibits no edema.  Lymphadenopathy:    He has no cervical adenopathy.  Neurological: He is alert and oriented to person, place, and time.  Skin: Skin is warm and dry.  Psychiatric: He has a normal mood and affect. His behavior is normal.    BP 118/78 mmHg  Pulse 63  Temp(Src) 97.9 F (36.6 C) (Oral)  Ht 5\' 11"  (1.803 m)  Wt 160 lb 2 oz (72.632 kg)  BMI 22.34 kg/m2  SpO2 95% Wt Readings from Last 3 Encounters:  11/21/14 160 lb 2 oz (72.632 kg)  11/16/14 162 lb 6.4 oz (73.664  kg)  09/11/14 165 lb (74.844 kg)     Lab Results  Component Value Date   WBC 9.1 09/19/2014   HGB 15.1 09/19/2014   HCT 44.2 09/19/2014   PLT 213 09/19/2014   GLUCOSE 102* 09/11/2014   CHOL 231* 07/28/2014   TRIG 73.0 07/28/2014   HDL 44.30 07/28/2014   LDLDIRECT 158.0 03/11/2013   LDLCALC 172* 07/28/2014   ALT 20 08/08/2014   AST 16 08/08/2014   NA 138 09/11/2014   K 4.3 09/11/2014   CL 104 09/11/2014   CREATININE 1.38* 09/11/2014   BUN 16 09/11/2014   CO2 26 09/11/2014   TSH  1.20 07/28/2014   PSA 1.63 10/14/2011   INR 2.8 11/03/2014   HGBA1C 6.3 03/11/2013    Lab Results  Component Value Date   TSH 1.20 07/28/2014   Lab Results  Component Value Date   WBC 9.1 09/19/2014   HGB 15.1 09/19/2014   HCT 44.2 09/19/2014   MCV 90.9 09/19/2014   PLT 213 09/19/2014   Lab Results  Component Value Date   NA 138 09/11/2014   K 4.3 09/11/2014   CHLORIDE 109 11/16/2012   CO2 26 09/11/2014   GLUCOSE 102* 09/11/2014   BUN 16 09/11/2014   CREATININE 1.38* 09/11/2014   BILITOT 0.6 08/08/2014   ALKPHOS 42 08/08/2014   AST 16 08/08/2014   ALT 20 08/08/2014   PROT 6.3* 08/08/2014   ALBUMIN 3.1* 08/08/2014   CALCIUM 9.3 09/11/2014   ANIONGAP 8 09/11/2014   GFR 71.10 07/28/2014   Lab Results  Component Value Date   CHOL 231* 07/28/2014   Lab Results  Component Value Date   HDL 44.30 07/28/2014   Lab Results  Component Value Date   LDLCALC 172* 07/28/2014   Lab Results  Component Value Date   TRIG 73.0 07/28/2014   Lab Results  Component Value Date   CHOLHDL 5 07/28/2014   Lab Results  Component Value Date   HGBA1C 6.3 03/11/2013       Assessment & Plan:   Problem List Items Addressed This Visit      Endocrine   Hypothyroid - Primary   Relevant Medications   thyroid (ARMOUR THYROID) 90 MG tablet   thyroid (ARMOUR THYROID) 60 MG tablet      I have discontinued Mr. Bannan HYDROcodone-acetaminophen and amoxicillin. I am also having him maintain  his b complex vitamins, Krill Oil, tacrolimus, NIACIN PO, Cholecalciferol, Coenzyme Q10 (COQ10 PO), albuterol, montelukast, warfarin, scopolamine, trimethoprim-polymyxin b, DUREZOL, Bromfenac Sodium, predniSONE, thyroid, and thyroid.  Meds ordered this encounter  Medications  . thyroid (ARMOUR THYROID) 90 MG tablet    Sig: Alternate this with Armour 60 mg 1 tab qod    Dispense:  45 tablet    Refill:  6    D/C PREVIOUS SCRIPTS FOR THIS MEDICATION  . thyroid (ARMOUR THYROID) 60 MG tablet    Sig: Take 1 tablet (60 mg total) by mouth every other day. Alternates with 90mg  every other day.    Dispense:  45 tablet    Refill:  6    D/C PREVIOUS SCRIPTS FOR THIS MEDICATION      [place], LPN

## 2014-11-23 ENCOUNTER — Ambulatory Visit (HOSPITAL_BASED_OUTPATIENT_CLINIC_OR_DEPARTMENT_OTHER)
Admission: RE | Admit: 2014-11-23 | Discharge: 2014-11-23 | Disposition: A | Discharge status: Home / Self Care | Payer: Medicare Other | Source: Ambulatory Visit | Attending: Family Medicine | Admitting: Family Medicine

## 2014-11-23 DIAGNOSIS — E079 Disorder of thyroid, unspecified: Secondary | ICD-10-CM | POA: Insufficient documentation

## 2014-11-23 DIAGNOSIS — R42 Dizziness and giddiness: Secondary | ICD-10-CM | POA: Insufficient documentation

## 2014-11-23 DIAGNOSIS — I6523 Occlusion and stenosis of bilateral carotid arteries: Secondary | ICD-10-CM | POA: Insufficient documentation

## 2014-11-24 ENCOUNTER — Other Ambulatory Visit: Payer: Medicare Other

## 2014-11-29 ENCOUNTER — Encounter: Payer: Self-pay | Admitting: Family Medicine

## 2014-11-29 DIAGNOSIS — E041 Nontoxic single thyroid nodule: Secondary | ICD-10-CM

## 2014-11-29 HISTORY — DX: Nontoxic single thyroid nodule: E04.1

## 2014-11-29 NOTE — Assessment & Plan Note (Signed)
Stable through imaging

## 2014-11-29 NOTE — Assessment & Plan Note (Addendum)
encouraged heart healthy diet, avoid trans fats, minimize simple carbs and saturated fats. Increase exercise as tolerated 

## 2014-11-29 NOTE — Assessment & Plan Note (Signed)
Encouraged complete cessation. Discussed need to quit as relates to risk of numerous cancers, cardiac and pulmonary disease as well as neurologic complications. Counseled for greater than 3 minutes 

## 2014-11-29 NOTE — Assessment & Plan Note (Signed)
Has tolerated coumadin, asymptomatic

## 2014-12-05 ENCOUNTER — Encounter: Payer: Self-pay | Admitting: Family Medicine

## 2014-12-22 ENCOUNTER — Other Ambulatory Visit (INDEPENDENT_AMBULATORY_CARE_PROVIDER_SITE_OTHER): Payer: Medicare Other | Admitting: Pharmacist

## 2014-12-22 ENCOUNTER — Telehealth: Payer: Self-pay

## 2014-12-22 DIAGNOSIS — I2699 Other pulmonary embolism without acute cor pulmonale: Secondary | ICD-10-CM

## 2014-12-22 DIAGNOSIS — D6859 Other primary thrombophilia: Secondary | ICD-10-CM

## 2014-12-22 DIAGNOSIS — D689 Coagulation defect, unspecified: Secondary | ICD-10-CM

## 2014-12-22 DIAGNOSIS — Z7901 Long term (current) use of anticoagulants: Secondary | ICD-10-CM

## 2014-12-22 DIAGNOSIS — D688 Other specified coagulation defects: Secondary | ICD-10-CM

## 2014-12-22 LAB — POCT INR: INR: 2.3

## 2014-12-22 NOTE — Patient Instructions (Signed)
Patient instructed to take medications as defined in the Anti-coagulation Track section of this encounter.  Patient instructed to take today's dose.  Patient verbalized understanding of these instructions.    

## 2014-12-22 NOTE — Progress Notes (Signed)
Anticoagulation Management Paul Ryan is a 69 y.o. male who reports to the clinic for monitoring of warfarin treatment.    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 Duration: indefinite  Anticoagulation Clinic Visit History: Anticoagulation Episode Summary    Current INR goal 2.0-3.0  Next INR check 01/30/2015  INR from last check 2.3 (12/22/2014)  Weekly max dose   Target end date   INR check location   Preferred lab   Send INR reminders to    Indications  Chronic anticoagulation [Z79.01] Coagulopathy ([place]) [[name]] DVT (deep venous thrombosis) ([place]) [[name]] Pulmonary embolism ([place]) [[name]] Hereditary protein S deficiency ([place]) [[name]]        Comments        ASSESSMENT Recent Results: Recent results are below, the most recent result is correlated with a dose of 22.5 mg per week: Lab Results  Component Value Date   INR 2.3 12/22/2014   INR 2.8* 11/21/2014   INR 2.8 11/03/2014   PROTIME 15.6* 05/17/2013   Pt states has been traveling in [place] and has been eating increased green vegetables in his diet while traveling.  Pt is aware that needs to keep the diet consistent.  Pt denied s/sx of bleeding or clottting and denied medication changes.   INR today: Therapeutic  Anticoagulation Dosing: INR as of 12/22/2014 and Previous Dosing Information    INR Dt INR Goal Madilyn Fireman [name]   12/22/2014 2.3 2.0-3.0 35 mg 5 mg 5 mg 5 mg 5 mg 5 mg 5 mg 5 mg    Previous description        Take 5mg  Monday, Wednesday, and Friday.  Other days take 2.5mg .       Anticoagulation Dose Instructions as of 12/22/2014      Total [name]   New Dose 22.5 mg 2.5 mg 5 mg 2.5 mg 2.5 mg 5 mg 2.5 mg 2.5 mg     (5 mg x 0.5)  (5 mg x 1)  (5 mg x 0.5)  (5 mg x 0.5)  (5 mg x 1)  (5 mg x 0.5)  (5 mg x 0.5)                         Description        Continue Coumadin 5 mg on Monday  and Thursdays and 2.5 mg all other days      PLAN Will continue Warfarin 5 mg on Monday and Thursdays and 2.5 mg AOD.  Instructed pt to try to maintain consistency in his diet with vit K rich foods even while traveling.  Will f/u INR in 4 weeks.  Patient Instructions  Patient instructed to take medications as defined in the Anti-coagulation Track section of this encounter.  Patient instructed to take today's dose.  Patient verbalized understanding of these instructions.        Follow-up Return in 6 weeks (on 01/30/2015) for f/u INR on 01/30/15 at 9 am .  Bennye Alm, PharmD Pharmacy Resident [phone]  20 minutes spent face-to-face with the patient during the encounter. 50% of time spent on education. 50% of time was spent on assessment and plan.

## 2014-12-22 NOTE — Telephone Encounter (Signed)
Call to the patient that has been seen by Dr. Linna Darner; but it appears he is with Dr. Charlett Blake in [place];

## 2014-12-26 ENCOUNTER — Ambulatory Visit (INDEPENDENT_AMBULATORY_CARE_PROVIDER_SITE_OTHER): Payer: Medicare Other | Admitting: Family Medicine

## 2014-12-26 ENCOUNTER — Encounter: Payer: Self-pay | Admitting: Family Medicine

## 2014-12-26 VITALS — BP 114/76 | HR 65 | Temp 98.0°F | Ht 72.0 in | Wt 164.2 lb

## 2014-12-26 DIAGNOSIS — Z23 Encounter for immunization: Secondary | ICD-10-CM

## 2014-12-26 DIAGNOSIS — T7840XD Allergy, unspecified, subsequent encounter: Secondary | ICD-10-CM

## 2014-12-26 DIAGNOSIS — E785 Hyperlipidemia, unspecified: Secondary | ICD-10-CM | POA: Diagnosis not present

## 2014-12-26 DIAGNOSIS — D689 Coagulation defect, unspecified: Secondary | ICD-10-CM

## 2014-12-26 DIAGNOSIS — E039 Hypothyroidism, unspecified: Secondary | ICD-10-CM

## 2014-12-26 DIAGNOSIS — M353 Polymyalgia rheumatica: Secondary | ICD-10-CM

## 2014-12-26 DIAGNOSIS — R739 Hyperglycemia, unspecified: Secondary | ICD-10-CM

## 2014-12-26 DIAGNOSIS — I2699 Other pulmonary embolism without acute cor pulmonale: Secondary | ICD-10-CM

## 2014-12-26 DIAGNOSIS — E559 Vitamin D deficiency, unspecified: Secondary | ICD-10-CM

## 2014-12-26 HISTORY — DX: Hyperglycemia, unspecified: R73.9

## 2014-12-26 MED ORDER — MONTELUKAST SODIUM 10 MG PO TABS: 10.0000 mg | ORAL_TABLET | Freq: Every day | ORAL | Status: DC

## 2014-12-26 MED ORDER — WARFARIN SODIUM 5 MG PO TABS: ORAL_TABLET | ORAL | Status: DC

## 2014-12-26 MED ORDER — ROSUVASTATIN CALCIUM 20 MG PO TABS: 20.0000 mg | ORAL_TABLET | Freq: Every day | ORAL | Status: DC

## 2014-12-26 NOTE — Progress Notes (Signed)
Subjective:    Patient ID: Paul Ryan, male    DOB: 10-27-1945, 69 y.o.   MRN: [id number]  Chief Complaint  Patient presents with  . Follow-up    HPI Patient is in today for follow up. He is feeling well but does continue to have a mild cough. It is improved with Singulair. He notes his pain from polymyalgia rheumatica is improved with 10 mg of prednisone but when he tries to go lower it worsens again. He is in the process of changing rheumatologists. No recent illness. Denies CP/palp/SOB/HA/congestion/fevers/GI or GU c/o. Taking meds as prescribed  Past Medical History  Diagnosis Date  . DVT (deep venous thrombosis) ([name])     takes coumadin  . Shingles 1982    right abdominal wall  . Allergy     seasonal  . Hyperlipidemia   . Left hip pain 12/17/2010  . Preventative health care 12/17/2010  . Coagulopathy ([place]) 05/16/2011  . Hereditary protein S deficiency ([place]) 05/16/2011  . Hypothyroid 06/10/2011  . Vitamin D deficiency 10/14/2011  . Dysuria 02/27/2013  . Chronic anticoagulation 06/14/2013  . Polymyalgia ([place]) 03/21/2014  . Collagen vascular disease ([place])   . Thyroid nodule 11/29/2014  . Hyperglycemia 12/26/2014    Past Surgical History  Procedure Laterality Date  . 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 Cancer Mother     unknown- everywhere  . Cancer Sister     breast    Social History   Social History  . Marital Status: Married    Spouse Name: N/A  . Number of Children: N/A  . Years of Education: N/A   Occupational History  . Not on file.   Social History Main Topics  . Smoking status: Former Smoker -- 1.00 packs/day for 20 years    Types: Cigarettes    Quit date: 03/03/1980  . Smokeless tobacco: Never Used  . Alcohol Use: No  . Drug Use: No  . Sexual Activity: Not on file   Other Topics Concern  . Not on file   Social History Narrative    Outpatient Prescriptions Prior to Visit   Medication Sig Dispense Refill  . albuterol (PROVENTIL HFA;VENTOLIN HFA) 108 (90 BASE) MCG/ACT inhaler Inhale 2 puffs into the lungs every 4 (four) hours as needed for wheezing or shortness of breath. 1 Inhaler 3  . b complex vitamins tablet Take 1 tablet by mouth daily.      . Bromfenac Sodium 0.09 % SOLN INSTILL 1 DROP INTO OPERATIVE EYE 2 TIMES DAILY STARTING THE DAY OF SURGERY UNTIL BOTTLE IS EMPTY  2  . Cholecalciferol 4000 UNITS TABS Take 1 tablet by mouth daily.    . Coenzyme Q10 (COQ10 PO) Take 2 tablets by mouth daily.    . DUREZOL 0.05 % EMUL INSTILL 1 DROP TO OPERATED EYE 3 TIMES DAILY STARTING THE DAY OF SURGERY. TAPER AS DIRECTED.  2  . Krill Oil 300 MG CAPS 1 cap daily    . NIACIN PO Take 1 tablet by mouth 3 (three) times daily.     . predniSONE (DELTASONE) 10 MG tablet Take 1 tablet (10 mg total) by mouth daily with breakfast. 30 tablet 0  . scopolamine (TRANSDERM-SCOP, 1.5 MG,) 1 MG/3DAYS Place 1 patch (1.5 mg total) onto the skin every 3 (three) days. (Patient not taking: Reported on 12/29/2014) 2 patch 0  . tacrolimus (PROTOPIC) 0.1 %  ointment Apply topically 2 (two) times daily. 100 g 1  . thyroid (ARMOUR THYROID) 60 MG tablet Take 1 tablet (60 mg total) by mouth every other day. Alternates with 90mg  every other day. 45 tablet 6  . thyroid (ARMOUR THYROID) 90 MG tablet Alternate this with Armour 60 mg 1 tab qod 45 tablet 6  . trimethoprim-polymyxin b (POLYTRIM) ophthalmic solution INSTILL 1 DROP TO OPERATED EYE 3 TIMES DAILY STARTING THE DAY OF SURGERY. TAPER AS DIRECTED.  2  . valACYclovir (VALTREX) 1000 MG tablet Take 1 tablet (1,000 mg total) by mouth 3 (three) times daily. (Patient not taking: Reported on 12/29/2014) 30 tablet 0  . montelukast (SINGULAIR) 10 MG tablet Take 1 tablet (10 mg total) by mouth at bedtime. 30 tablet 3  . warfarin (COUMADIN) 5 MG tablet TAKE 1 TABLET BY MOUTH EVERY DAY AT 6 PM (Patient taking differently: No sig reported) 30 tablet 0   No  facility-administered medications prior to visit.    No Known Allergies  Review of Systems  Constitutional: Negative for fever and malaise/fatigue.  HENT: Negative for congestion.   Eyes: Negative for discharge.  Respiratory: Positive for cough. Negative for shortness of breath.   Cardiovascular: Negative for chest pain, palpitations and leg swelling.  Gastrointestinal: Negative for nausea and abdominal pain.  Genitourinary: Negative for dysuria.  Musculoskeletal: Negative for falls.  Skin: Negative for rash.  Neurological: Negative for loss of consciousness and headaches.  Endo/Heme/Allergies: Negative for environmental allergies.  Psychiatric/Behavioral: Negative for depression. The patient is not nervous/anxious.        Objective:    Physical Exam  Constitutional: He is oriented to person, place, and time. He appears well-developed and well-nourished. No distress.  HENT:  Head: Normocephalic and atraumatic.  Nose: Nose normal.  Eyes: Right eye exhibits no discharge. Left eye exhibits no discharge.  Neck: Normal range of motion. Neck supple.  Cardiovascular: Normal rate and regular rhythm.   No murmur heard. Pulmonary/Chest: Effort normal and breath sounds normal.  Abdominal: Soft. Bowel sounds are normal. There is no tenderness.  Musculoskeletal: He exhibits no edema.  Neurological: He is alert and oriented to person, place, and time.  Skin: Skin is warm and dry.  Psychiatric: He has a normal mood and affect.  Nursing note and vitals reviewed.   BP 114/76 mmHg  Pulse 65  Temp(Src) 98 F (36.7 C) (Oral)  Ht 6' (1.829 m)  Wt 164 lb 4 oz (74.503 kg)  BMI 22.27 kg/m2  SpO2 97% Wt Readings from Last 3 Encounters:  12/29/14 161 lb (73.029 kg)  12/26/14 164 lb 4 oz (74.503 kg)  11/21/14 160 lb 2 oz (72.632 kg)     Lab Results  Component Value Date   WBC 8.0 11/21/2014   HGB 15.1 11/21/2014   HCT 45.5 11/21/2014   PLT 266.0 11/21/2014   GLUCOSE 108* 11/21/2014    CHOL 245* 11/21/2014   TRIG 86.0 11/21/2014   HDL 37.10* 11/21/2014   LDLDIRECT 158.0 03/11/2013   LDLCALC 191* 11/21/2014   ALT 28 11/21/2014   AST 19 11/21/2014   NA 140 11/21/2014   K 3.7 11/21/2014   CL 103 11/21/2014   CREATININE 0.95 11/21/2014   BUN 15 11/21/2014   CO2 27 11/21/2014   TSH 0.47 11/21/2014   PSA 1.63 10/14/2011   INR 2.3 12/22/2014   HGBA1C 6.3 03/11/2013    Lab Results  Component Value Date   TSH 0.47 11/21/2014   Lab Results  Component Value  Date   WBC 8.0 11/21/2014   HGB 15.1 11/21/2014   HCT 45.5 11/21/2014   MCV 92.1 11/21/2014   PLT 266.0 11/21/2014   Lab Results  Component Value Date   NA 140 11/21/2014   K 3.7 11/21/2014   CHLORIDE 109 11/16/2012   CO2 27 11/21/2014   GLUCOSE 108* 11/21/2014   BUN 15 11/21/2014   CREATININE 0.95 11/21/2014   BILITOT 0.6 11/21/2014   ALKPHOS 50 11/21/2014   AST 19 11/21/2014   ALT 28 11/21/2014   PROT 8.2 11/21/2014   ALBUMIN 4.0 11/21/2014   CALCIUM 9.7 11/21/2014   ANIONGAP 8 09/11/2014   GFR 83.25 11/21/2014   Lab Results  Component Value Date   CHOL 245* 11/21/2014   Lab Results  Component Value Date   HDL 37.10* 11/21/2014   Lab Results  Component Value Date   LDLCALC 191* 11/21/2014   Lab Results  Component Value Date   TRIG 86.0 11/21/2014   Lab Results  Component Value Date   CHOLHDL 7 11/21/2014   Lab Results  Component Value Date   HGBA1C 6.3 03/11/2013       Assessment & Plan:   Problem List Items Addressed This Visit    Vitamin D deficiency   Relevant Orders   TSH   CBC   Hemoglobin A1c   Comprehensive metabolic panel   Lipid panel   Hepatitis C antibody   Vitamin D (25 hydroxy)   Polymyalgia rheumatica ([name])    Prednisone 10 mg daily. Doing well, no new concerns.       Hypothyroid    On Armour Thyroid, continue to monitor      Relevant Orders   TSH   CBC   Hemoglobin A1c   Comprehensive metabolic panel   Lipid panel   Hepatitis C  antibody   Hyperlipidemia    Tolerating statin, encouraged heart healthy diet, avoid trans fats, minimize simple carbs and saturated fats. Increase exercise as tolerated      Relevant Medications   warfarin (COUMADIN) 5 MG tablet   rosuvastatin (CRESTOR) 20 MG tablet   Other Relevant Orders   TSH   CBC   Hemoglobin A1c   Comprehensive metabolic panel   Lipid panel   Hepatitis C antibody   Hyperglycemia    minimize simple carbs. Increase exercise as tolerated.      Relevant Orders   TSH   CBC   Hemoglobin A1c   Comprehensive metabolic panel   Lipid panel   Hepatitis C antibody   Coagulopathy ([name])    Will need lifelong anticoagulation      Allergic state    Singulair 10 mg has been helpful      RESOLVED: Acute pulmonary embolism ([name])   Relevant Medications   warfarin (COUMADIN) 5 MG tablet   rosuvastatin (CRESTOR) 20 MG tablet   Other Relevant Orders   TSH   CBC   Hemoglobin A1c   Comprehensive metabolic panel   Lipid panel   Hepatitis C antibody    Other Visit Diagnoses    Encounter for immunization    -  Primary    Need for 23-polyvalent pneumococcal polysaccharide vaccine        Relevant Orders    Pneumococcal polysaccharide vaccine 23-valent greater than or equal to 2yo subcutaneous/IM (Completed)       I have changed Mr. Guster CRESTOR to rosuvastatin. I have also changed his warfarin. I am also having him maintain his b complex vitamins, Krill Oil,  tacrolimus, NIACIN PO, Cholecalciferol, Coenzyme Q10 (COQ10 PO), albuterol, scopolamine, trimethoprim-polymyxin b, DUREZOL, Bromfenac Sodium, predniSONE, thyroid, thyroid, valACYclovir, and montelukast.  Meds ordered this encounter  Medications  . montelukast (SINGULAIR) 10 MG tablet    Sig: Take 1 tablet (10 mg total) by mouth at bedtime.    Dispense:  90 tablet    Refill:  3  . warfarin (COUMADIN) 5 MG tablet    Sig: Take 5 mg Monday and Thursday, take 2.5 mg all other days.    Dispense:  90 tablet     Refill:  1  . rosuvastatin (CRESTOR) 20 MG tablet    Sig: Take 1 tablet (20 mg total) by mouth at bedtime.    Dispense:  90 tablet    Refill:  1     Penni Homans, MD

## 2014-12-26 NOTE — Progress Notes (Signed)
Pre visit review using our clinic review tool, if applicable. No additional management support is needed unless otherwise documented below in the visit note. 

## 2014-12-26 NOTE — Patient Instructions (Signed)

## 2014-12-28 ENCOUNTER — Institutional Professional Consult (permissible substitution): Payer: Medicare Other | Admitting: Pulmonary Disease

## 2014-12-29 ENCOUNTER — Ambulatory Visit (INDEPENDENT_AMBULATORY_CARE_PROVIDER_SITE_OTHER): Payer: Medicare Other | Admitting: Internal Medicine

## 2014-12-29 ENCOUNTER — Encounter: Payer: Self-pay | Admitting: Internal Medicine

## 2014-12-29 VITALS — BP 112/76 | HR 66 | Ht 71.0 in | Wt 161.0 lb

## 2014-12-29 DIAGNOSIS — R0600 Dyspnea, unspecified: Secondary | ICD-10-CM | POA: Insufficient documentation

## 2014-12-29 DIAGNOSIS — R05 Cough: Secondary | ICD-10-CM | POA: Diagnosis not present

## 2014-12-29 DIAGNOSIS — R058 Other specified cough: Secondary | ICD-10-CM

## 2014-12-29 NOTE — Progress Notes (Signed)
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  Admit date: 08/07/2014 Discharge date: 08/08/2014    Discharge Diagnoses:  Principal Problem:  Acute pulmonary embolism Active Problems:  DVT (deep venous thrombosis)  Hereditary protein S deficiency  Hypothyroid  Polymyalgia rheumatica > not confirmed by [facility] [employer]   08/07/14 1956 08/07/14 2324  Weight: 72.576 kg (160 lb) 70.852 kg (156 lb 3.2 oz)    History of present illness:  Paul Ryan is a 69 y.o. male with Past medical history of protein S deficiency, dyslipidemia, hypothyroidism, prior DVT. The patient is presenting with complaints of cough and shortness of breath. The patient mentions back in 2010 was diagnosed with DVT. He has seen Dr. Jim Desanctis after that diagnosis and was on warfarin for 4 years. One year ago He was switched to Xarelto and he has been asymptomatic. He went to [place] 3 months ago and stayed there remain compliant with all his medications. 5 weeks ago he had upper respiratory symptoms which gradually improving. He returned to [place] 2 weeks ago and started having reoccurrence of cough which was dry and nonproductive as well as sharp chest pain. He also started having shortness of breath. He was treated symptomatically for a recurrence of upper respiratory symptoms but since that did not improve he went to see his PCP again and he had a CAT scan as an outpatient. The CAT scan was positive for small pulmonary embolism and therefore he was referred to admission. The patient at the  time of my evaluation denies any complain. He continues to mention is compliant with all his medications last Xarelto was taken on 08/06/2014. He mentions he has to attend his daughter's wedding in [place] one week after.   Hospital Course:  Acute pulmonary embolism -recurrence of pulmonary embolism. - protein S deficiency  - has been on Xarelto for one year -returned from [place] 2 weeks ago. -duplex negative  -spoke with Dr. Beryle Beams- change from [place] back to coumadin- can be d/c'd on lovenox/coumadin bridge- ok to travel once anticoagulated - explained to patient and he says he has done lovenox before and is comfortable with the plan  Polymyalgia rheumatica. Continue prednisone at home doses.  Hypothyroidism. Continue home thyroid dose        12/29/2014 1st [facility] office visit/ Paul Ryan   Chief Complaint  Patient presents with  . Pulmonary Consult    Referred by Dr Gracelyn Nurse for increased SOB. Pt states "I have to make an effore just to take a breath" Pt states having PE 3-4 months ago that required anti-coag treatment . Pt denies cough, wheezing, chest congestion, PND, edema, fever and hemoptysis  onset sob/cough   08/2014 with "cold" persistent sense of resting paroxysms of sob since then but no trouble sleeping/ present at rest 30% of the day but varies some as does the cough/ always happens walking fast pace but can still do 3 miles including some inclines  Cough is not gone, still clearing the  throat and mod hoarse with voice fatigue   No obvious  patterns in day to day or daytime variabilty or assoc  cp or chest tightness, subjective wheeze overt sinus or hb symptoms. No unusual exp hx or h/o childhood pna/ asthma or knowledge of premature birth.  Sleeping ok without nocturnal  or early am exacerbation  of respiratory  c/o's or need for noct saba. Also denies any obvious fluctuation of symptoms with weather or environmental changes or other aggravating or alleviating  factors except as outlined above   Current Medications, Allergies, Complete Past Medical History, Past Surgical History, Family History, and Social History were reviewed in [employer] record.              Review of Systems  Constitutional: Negative for fever, chills, activity change, appetite change and unexpected weight change.  HENT: Negative for congestion, dental problem, postnasal drip, rhinorrhea, sneezing, sore throat, trouble swallowing and voice change.   Eyes: Negative for visual disturbance.  Respiratory: Positive for shortness of breath. Negative for cough and choking.   Cardiovascular: Negative for chest pain and leg swelling.  Gastrointestinal: Negative for nausea, vomiting and abdominal pain.  Genitourinary: Negative for difficulty urinating.  Musculoskeletal: Negative for arthralgias.  Skin: Negative for rash.  Psychiatric/Behavioral: Negative for behavioral problems and confusion.       Objective:   Physical Exam  amb wm with [place] accent and classic voice fatigue/ freq throat clearing   Wt Readings from Last 3 Encounters:  12/29/14 161 lb (73.029 kg)  12/26/14 164 lb 4 oz (74.503 kg)  11/21/14 160 lb 2 oz (72.632 kg)    Vital signs reviewed   HEENT: nl dentition, turbinates, and orophanx. Nl external ear canals without cough reflex   NECK :  without JVD/Nodes/TM/ nl carotid upstrokes bilaterally   LUNGS: no acc muscle use, clear to A and P bilaterally without cough on insp or exp maneuvers   CV:  RRR  no s3 or murmur or increase in P2, no edema   ABD:  soft and nontender with nl excursion in the supine position. No bruits or organomegaly, bowel sounds nl  MS:  warm without deformities, calf tenderness, cyanosis or clubbing  SKIN: warm and dry without lesions    NEURO:  alert, approp, no deficits      I personally reviewed images and agree with radiology impression as follows:  CTa 09/11/14 Stable left lower lobe  subsegmental pulmonary artery embolus. No immediate embolus identified.      Assessment & Plan:

## 2014-12-29 NOTE — Patient Instructions (Signed)
Your problem is still related to your cold (viral upper resp infection)  .That is, the more sensitive the epithelium becomes once it is damaged by the virus, the more the ensuing irritability> the more the cough, the more the secondary reflux (especially in those prone to reflux) the more the irritation of the sensitive mucosa and so on in a  Classic cyclical pattern that won't heal until you stop clearing your throat completely.   Try prilosec otc 20mg   Take 30-60 min before first meal of the day and Pepcid ac (famotidine) 20 mg one @  bedtime until cough is completely gone for at least a week without the need for cough suppression  If tickle persists:  Try  CHLORPHENIRAMINE  4 mg - take one every 4 hours as needed - available over the counter- may cause drowsiness so start with just a bedtime dose or two and see how you tolerate it before trying in daytime    GERD (REFLUX)  is an extremely common cause of respiratory symptoms just like yours , many times with no obvious heartburn at all.    It can be treated with medication, but also with lifestyle changes including elevation of the head of your bed (ideally with 6 inch  bed blocks),  Smoking cessation, avoidance of late meals, excessive alcohol, and avoid fatty foods, chocolate, peppermint, colas, red wine, and acidic juices such as orange juice.  NO MINT OR MENTHOL PRODUCTS SO NO COUGH DROPS  USE SUGARLESS CANDY INSTEAD (Jolley ranchers or Stover's or Life Savers) or even ice chips will also do - the key is to swallow to prevent all throat clearing. NO OIL BASED VITAMINS - use powdered substitutes - HOLD the Krill oil and eat more fish while you are still coughing/clearing your throat  Please see patient coordinator before you leave today  to schedule echo and I will call with results

## 2014-12-31 DIAGNOSIS — R058 Other specified cough: Secondary | ICD-10-CM | POA: Insufficient documentation

## 2014-12-31 DIAGNOSIS — R05 Cough: Secondary | ICD-10-CM | POA: Insufficient documentation

## 2014-12-31 NOTE — Assessment & Plan Note (Addendum)
-   12/29/2014  Walked RA x 3 laps @ 185 ft each stopped due to End of study, brisk  pace, no sob or desat   - max gerd rx 12/29/2014 >>>  - Echo 12/29/2014 ordered   Of all the presentations of PE,  Masquerading as an upper respiratory tract infection would have to be at the very bottom (fortunately) otherwise we'd be scanning millions and millions of pts on a yearly basis needlessly  ie   I don't think he had an acute PE in June 2016 but a chronic one and probably of very little consequence given the vascular reserve of the pulmonary vasculature and all he needs at this point is an echo fro baseline.    Which brings [place] back to the ddx of unexplained sob:  DDX of  difficult airways management all start with A and  include Adherence, Ace Inhibitors, Acid Reflux, Active Sinus Disease, Alpha 1 Antitripsin deficiency, Anxiety masquerading as Airways dz,  ABPA,  allergy(esp in young), Aspiration (esp in elderly), Adverse effects of meds,  Active smokers, A bunch of PE's (a small clot burden can't cause this syndrome unless there is already severe underlying pulm or vascular dz with poor reserve) plus two Bs  = Bronchiectasis and Beta blocker use..and one C= CHF   ? Acid (or non-acid) GERD > always difficult to exclude as up to 75% of pts in some series report no assoc GI/ Heartburn symptoms> rec max (24h)  acid suppression and diet restrictions/ reviewed and instructions given in writing. See uacs a/p  ? Allergy > very unlikely on singulair and prednisone but could be adding to uacs (see uacs  a/p and recs)   ? Anxiety > usually a dx of exclusion    ? A bunch of pe's > already excluded

## 2014-12-31 NOTE — Assessment & Plan Note (Signed)
Explained the natural history of uri (which is probably what happened in June 2016)  and why it's necessary in patients at risk to treat GERD aggressively - at least  short term -   to reduce risk of evolving cyclical cough initially  triggered by epithelial injury and a heightened sensitivty to the effects of any upper airway irritants,  most importantly acid - related - then perpetuated by epithelial injury related to the cough itself as the upper airway collapses on itself.  That is, the more sensitive the epithelium becomes once it is damaged by the virus, the more the ensuing irritability> the more the cough, the more the secondary reflux (especially in those prone to reflux) the more the irritation of the sensitive mucosa and so on in a  Classic cyclical pattern.    I had an extended discussion with the patient reviewing all relevant studies completed to date and  lasting 35 minutes of a 60 minute visit    Each maintenance medication was reviewed in detail including most importantly the difference between maintenance and prns and under what circumstances the prns are to be triggered using an action plan format that is not reflected in the computer generated alphabetically organized AVS.    Please see instructions for details which were reviewed in writing and the patient given a copy highlighting the part that I personally wrote and discussed at today's ov.

## 2015-01-07 NOTE — Assessment & Plan Note (Signed)
Tolerating statin, encouraged heart healthy diet, avoid trans fats, minimize simple carbs and saturated fats. Increase exercise as tolerated 

## 2015-01-07 NOTE — Assessment & Plan Note (Signed)
Will need lifelong anticoagulation 

## 2015-01-07 NOTE — Assessment & Plan Note (Signed)
On Armour Thyroid, continue to monitor  

## 2015-01-07 NOTE — Assessment & Plan Note (Signed)
Prednisone 10 mg daily. Doing well, no new concerns.

## 2015-01-07 NOTE — Assessment & Plan Note (Signed)
minimize simple carbs. Increase exercise as tolerated.  

## 2015-01-07 NOTE — Assessment & Plan Note (Signed)
Singulair 10 mg has been helpful

## 2015-01-08 ENCOUNTER — Ambulatory Visit: Payer: Medicare Other | Admitting: Oncology

## 2015-01-09 ENCOUNTER — Encounter: Payer: Self-pay | Admitting: Family Medicine

## 2015-01-09 ENCOUNTER — Ambulatory Visit (INDEPENDENT_AMBULATORY_CARE_PROVIDER_SITE_OTHER): Payer: Medicare Other | Admitting: Oncology

## 2015-01-09 ENCOUNTER — Encounter: Payer: Self-pay | Admitting: Oncology

## 2015-01-09 ENCOUNTER — Encounter: Payer: Medicare Other | Admitting: Oncology

## 2015-01-09 VITALS — BP 107/71 | HR 56 | Temp 97.7°F | Ht 70.0 in | Wt 167.2 lb

## 2015-01-09 DIAGNOSIS — I82401 Acute embolism and thrombosis of unspecified deep veins of right lower extremity: Secondary | ICD-10-CM

## 2015-01-09 DIAGNOSIS — I2782 Chronic pulmonary embolism: Secondary | ICD-10-CM

## 2015-01-09 DIAGNOSIS — D688 Other specified coagulation defects: Secondary | ICD-10-CM

## 2015-01-09 DIAGNOSIS — Z7901 Long term (current) use of anticoagulants: Secondary | ICD-10-CM | POA: Diagnosis not present

## 2015-01-09 DIAGNOSIS — D6859 Other primary thrombophilia: Secondary | ICD-10-CM

## 2015-01-09 DIAGNOSIS — D689 Coagulation defect, unspecified: Secondary | ICD-10-CM

## 2015-01-09 NOTE — Patient Instructions (Signed)
Standing lab every 3 months to start 03/27/15 MD visit 1 year

## 2015-01-09 NOTE — Progress Notes (Signed)
Patient ID: Paul Ryan, male   DOB: 07/02/1945, 69 y.o.   MRN: [id number] Hematology and Oncology Follow Up Visit  Paul Ryan [id number] 1945-06-17 69 y.o. 01/09/2015 1:14 PM   Principle Diagnosis: Encounter Diagnoses  Name Primary?  . Coagulopathy ([place]) Yes  . Hereditary protein S deficiency ([place])   . Deep vein thrombosis (DVT) of right lower extremity, unspecified chronicity, unspecified vein ([name])   . Chronic anticoagulation   . 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 He sustained a non-provoked right lower extremity DVT in June 2010. He was initially anticoagulated with Coumadin for 1 year due to the fact that he travels frequently to [place]. Protein S and protein C levels were checked when he was off Coumadin and he had a reproducible decrease in functional protein S with borderline decrease in free protein S. He was put back on Coumadin. He was changed to Xarelto in April 2015 and did well until June 2016. He developed a bronchitis with a dry cough while he was visiting [place] and then dyspnea and pleuritic chest pain. CT scan of the chest done 08/07/2014 positive for left lower lobe pulmonary embolus with a single filling defect in a left lower lobe pulmonary artery. I elected to put him back on Coumadin at that time and stop the Xarelto. Of note in January 2016 he began to develop polymyalgia. I initially thought this was from the [place] but there was no improvement when Xarelto was temporarily discontinued. He had a significant elevation of ESR 84 mm. ANA negative, Rheumatoid factor negative. Muscle enzymes normal. ANCA negative. ACE level normal. Cyclic citrulline peptide antibodies undetectable. Sjogren antibodies undetectable. No improvement in symptoms off Crestor. He was put on a trial of prednisone by sports medicine physician Dr. Gardenia Phlegm. Symptoms did improve. Findings were not felt to be typical of polymyalgia rheumatica  but he has seen a Rheumatologist and he remains on low dose prednisone.   Interim History:   He had 2 visits to the emergency department in July for persistent bronchitis. In view of his history instead of getting a chest x-ray a CT angiogram was done on 09/11/2014 which showed changes from previously diagnosed left lower lobe subsegmental pulmonary embolus but no new emboli. No infiltrate or effusions. He was referred to Dr. Melvyn Novas, pulmonary medicine. His impression was that the patient's dry cough was more likely due to reflux esophagitis than a primary pulmonary condition. He was started on antireflux treatment. He has had some improvement. He continues to have polymyalgias. These are partially relieved by low-dose prednisone current dose 7.5 mg daily. He will be getting a second opinion from another rheumatologist syndrome. He would like to have a treatment strategy going into the future. He has had no recurrent problems with his right leg site of previous DVT. 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 Family doing well. Daughter got married.  Medications: reviewed  Allergies: No Known Allergies  Review of Systems: See HPI Remaining ROS negative:   Physical Exam: Blood pressure 107/71, pulse 56, temperature 97.7 F (36.5 C), temperature source Oral, height _0  (1.778 m), weight 167 lb 3.2 oz (75.841 kg), SpO2 99 %. Wt Readings from Last 3 Encounters:  01/09/15 167 lb 3.2 oz (75.841 kg)  12/29/14 161 lb (73.029 kg)  12/26/14 164 lb 4 oz (74.503 kg)  General appearance: thin Asian man HENNT: Pharynx no erythema, exudate, mass, or ulcer. No thyromegaly or thyroid nodules Lymph nodes: No cervical, supraclavicular, or axillary lymphadenopathy Breasts:  Lungs: Clear to auscultation, resonant to percussion throughout Heart: Regular rhythm, no murmur, no gallop, no rub, no click,  no edema Abdomen: Soft, nontender, normal bowel sounds, no mass, no organomegaly Extremities: No edema, no calf tenderness Calf measurements: 37.5 cm right; 35 cm left Musculoskeletal: no joint deformities GU:  Vascular: Carotid pulses 2+, no bruits, Neurologic: Alert, oriented, PERRLA, , cranial nerves grossly normal, motor strength 5 over 5, reflexes 1+ symmetric, upper body coordination normal, gait normal, Skin: No rash or ecchymosis  Lab Results: CBC W/Diff    Component Value Date/Time   WBC 8.0 11/21/2014 1132   WBC 10.5* 05/17/2013 0902   RBC 4.95 11/21/2014 1132   RBC 4.99 05/17/2013 0902   HGB 15.1 11/21/2014 1132   HGB 15.2 05/17/2013 0902   HCT 45.5 11/21/2014 1132   HCT 44.6 05/17/2013 0902   PLT 266.0 11/21/2014 1132   PLT 181 05/17/2013 0902   MCV 92.1 11/21/2014 1132   MCV 89.4 05/17/2013 0902   MCH 31.1 09/19/2014 1135   MCH 30.5 05/17/2013 0902   MCHC 33.2 11/21/2014 1132   MCHC 34.1 05/17/2013 0902   RDW 13.9 11/21/2014 1132   RDW 13.7 05/17/2013 0902   LYMPHSABS 2.2 09/19/2014 1135   LYMPHSABS 1.7 05/17/2013 0902   MONOABS 0.6 09/19/2014 1135   MONOABS 0.4 05/17/2013 0902   EOSABS 0.1 09/19/2014 1135   EOSABS 0.0 05/17/2013 0902   BASOSABS 0.0 09/19/2014 1135   BASOSABS 0.0 05/17/2013 0902     Chemistry      Component Value Date/Time   NA 140 11/21/2014 1132   NA 143 11/16/2012 1145   K 3.7 11/21/2014 1132   K 4.4 11/16/2012 1145   CL 103 11/21/2014 1132   CO2 27 11/21/2014 1132   CO2 27 11/16/2012 1145   BUN 15 11/21/2014 1132   BUN 16.2 11/16/2012 1145   CREATININE 0.95 11/21/2014 1132   CREATININE 0.90 03/21/2014 1521   CREATININE 1.1 11/16/2012 1145      Component Value Date/Time   CALCIUM 9.7 11/21/2014 1132   CALCIUM 9.3 11/16/2012 1145   ALKPHOS 50 11/21/2014 1132   ALKPHOS 48 11/16/2012 1145   AST 19 11/21/2014 1132   AST 23 11/16/2012 1145   ALT 28 11/21/2014 1132   ALT 40 11/16/2012 1145   BILITOT 0.6 11/21/2014 1132    BILITOT 0.75 11/16/2012 1145       Radiological Studies: No results found.  Impression:  #1. 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 Status post right lower extremity DVT June 2010. Status post pulmonary embolus to a single left lower lobe pulmonary artery branch June 2016 occurring at time of a acute respiratory infection and while on Xarelto  #3. Chronic anticoagulation back on Coumadin in view of #2 above. We again discussed changing his anticoagulant. I feel it is probably safer for him to stay on the Coumadin. He is at low risk for bleeding and is not on multiple other medications. I feel comfortable change him to every 3 month pro times except when he plans to travel to [place] when I would back to get current lab a week or 2 before travel and to make sure that he is in the therapeutic range. He is again reminded that the main interaction with Coumadin is with antibiotics and extra Coumadin monitoring will  be necessary if he goes on a prolonged course of antibiotics.  #4. Probable polymyalgia rheumatica He continues on low-dose steroids and has rheumatology follow-up.  CC: Patient Care Team: [name], MD as PCP - General (Family Medicine) Roena Malady, MD (Ophthalmology)   Annia Belt, MD 11/8/20161:14 PM

## 2015-01-11 ENCOUNTER — Other Ambulatory Visit: Payer: Self-pay

## 2015-01-11 ENCOUNTER — Ambulatory Visit (HOSPITAL_COMMUNITY): Payer: Medicare Other | Attending: Internal Medicine

## 2015-01-11 DIAGNOSIS — Z87891 Personal history of nicotine dependence: Secondary | ICD-10-CM | POA: Insufficient documentation

## 2015-01-11 DIAGNOSIS — I340 Nonrheumatic mitral (valve) insufficiency: Secondary | ICD-10-CM | POA: Diagnosis not present

## 2015-01-11 DIAGNOSIS — R0600 Dyspnea, unspecified: Secondary | ICD-10-CM | POA: Insufficient documentation

## 2015-01-11 DIAGNOSIS — E785 Hyperlipidemia, unspecified: Secondary | ICD-10-CM | POA: Diagnosis not present

## 2015-01-11 DIAGNOSIS — I351 Nonrheumatic aortic (valve) insufficiency: Secondary | ICD-10-CM | POA: Insufficient documentation

## 2015-01-18 ENCOUNTER — Encounter: Payer: Self-pay | Admitting: Rheumatology

## 2015-01-30 ENCOUNTER — Ambulatory Visit: Payer: Medicare Other | Admitting: Pharmacist

## 2015-02-08 ENCOUNTER — Encounter: Payer: Self-pay | Admitting: Family Medicine

## 2015-02-08 ENCOUNTER — Other Ambulatory Visit (HOSPITAL_COMMUNITY): Payer: Self-pay | Admitting: Rheumatology

## 2015-02-08 ENCOUNTER — Ambulatory Visit (HOSPITAL_COMMUNITY)
Admission: RE | Admit: 2015-02-08 | Discharge: 2015-02-08 | Disposition: A | Discharge status: Home / Self Care | Payer: Medicare Other | Source: Ambulatory Visit | Attending: Rheumatology | Admitting: Rheumatology

## 2015-02-08 DIAGNOSIS — R7611 Nonspecific reaction to tuberculin skin test without active tuberculosis: Secondary | ICD-10-CM

## 2015-02-28 ENCOUNTER — Encounter: Payer: Self-pay | Admitting: Oncology

## 2015-03-02 ENCOUNTER — Encounter: Payer: Self-pay | Admitting: Oncology

## 2015-03-08 ENCOUNTER — Ambulatory Visit (INDEPENDENT_AMBULATORY_CARE_PROVIDER_SITE_OTHER): Payer: Medicare Other | Admitting: Pharmacist

## 2015-03-08 DIAGNOSIS — D688 Other specified coagulation defects: Secondary | ICD-10-CM | POA: Diagnosis not present

## 2015-03-08 DIAGNOSIS — I82409 Acute embolism and thrombosis of unspecified deep veins of unspecified lower extremity: Secondary | ICD-10-CM

## 2015-03-08 DIAGNOSIS — Z7901 Long term (current) use of anticoagulants: Secondary | ICD-10-CM | POA: Diagnosis not present

## 2015-03-08 DIAGNOSIS — D689 Coagulation defect, unspecified: Secondary | ICD-10-CM

## 2015-03-08 DIAGNOSIS — D6859 Other primary thrombophilia: Secondary | ICD-10-CM

## 2015-03-08 DIAGNOSIS — I2699 Other pulmonary embolism without acute cor pulmonale: Secondary | ICD-10-CM

## 2015-03-08 LAB — POCT INR: INR: 1.9

## 2015-03-08 NOTE — Progress Notes (Signed)
Anticoagulation Management Paul Ryan is a 70 y.o. male who reports to the clinic for monitoring of warfarin treatment.    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  Anticoagulation Clinic Visit History: Patient does not report signs/symptoms of bleeding or thromboembolism Other recent changes: antibiotics for TB as noted  Anticoagulation Episode Summary    Current INR goal 2.0-3.0  Next INR check 03/12/2015  INR from last check 1.9! (03/08/2015)  Weekly max dose   Target end date   INR check location   Preferred lab   Send INR reminders to    Indications  Chronic anticoagulation [Z79.01] Coagulopathy ([place]) [[name]] DVT (deep venous thrombosis) ([place]) [[name]] Pulmonary embolism ([place]) [[name]] Hereditary protein S deficiency ([place]) [[name]]        Comments        ASSESSMENT Recent Results: Recent results are below, the most recent result is correlated with a dose of 22.5 mg per week: Lab Results  Component Value Date   INR 1.9 03/08/2015   INR 2.3 12/22/2014   INR 2.8* 11/21/2014   PROTIME 15.6* 05/17/2013   INR today: Therapeutic  Anticoagulation Dosing: INR as of 03/08/2015 and Previous Dosing Information    INR Dt INR Goal [name] [name]   03/08/2015 1.9 2.0-3.0 22.5 mg 2.5 mg 5 mg 2.5 mg 2.5 mg 5 mg 2.5 mg 2.5 mg    Previous description        Continue Coumadin 5 mg on Monday and Thursdays and 2.5 mg all other days    Anticoagulation Dose Instructions as of 03/08/2015      Total [name]   New Dose 22.5 mg 2.5 mg 5 mg 2.5 mg 2.5 mg 5 mg 2.5 mg 2.5 mg     (5 mg x 0.5)  (5 mg x 1)  (5 mg x 0.5)  (5 mg x 0.5)  (5 mg x 1)  (5 mg x 0.5)  (5 mg x 0.5)                         Description        Continue Coumadin 5 mg on Monday and Thursdays and 2.5 mg all other days      PLAN Weekly dose was unchanged  Patient Instructions  Patient educated about medication as defined in this encounter and  verbalized understanding by repeating back instructions provided.   Follow-up Return in about 4 days (around 03/12/2015) for Follow up INR 03/12/15 at [facility]  30 minutes spent face-to-face with the patient during the encounter. 75% of time spent on education. 25% of time was spent on assessment and plan.

## 2015-03-08 NOTE — Patient Instructions (Addendum)
Patient educated about medication as defined in this encounter and verbalized understanding by repeating back instructions provided.   

## 2015-03-09 NOTE — Progress Notes (Signed)
Reviewed Thanks DrG 

## 2015-03-12 ENCOUNTER — Encounter: Payer: Self-pay | Admitting: Family Medicine

## 2015-03-14 ENCOUNTER — Ambulatory Visit (INDEPENDENT_AMBULATORY_CARE_PROVIDER_SITE_OTHER): Payer: Medicare Other | Admitting: Pharmacist

## 2015-03-14 VITALS — Wt 173.0 lb

## 2015-03-14 DIAGNOSIS — Z7901 Long term (current) use of anticoagulants: Secondary | ICD-10-CM | POA: Diagnosis not present

## 2015-03-14 DIAGNOSIS — D689 Coagulation defect, unspecified: Secondary | ICD-10-CM

## 2015-03-14 DIAGNOSIS — D688 Other specified coagulation defects: Secondary | ICD-10-CM | POA: Diagnosis not present

## 2015-03-14 DIAGNOSIS — D6859 Other primary thrombophilia: Secondary | ICD-10-CM

## 2015-03-14 DIAGNOSIS — I82409 Acute embolism and thrombosis of unspecified deep veins of unspecified lower extremity: Secondary | ICD-10-CM

## 2015-03-14 DIAGNOSIS — I2699 Other pulmonary embolism without acute cor pulmonale: Secondary | ICD-10-CM | POA: Diagnosis not present

## 2015-03-14 LAB — POCT INR: INR: 1.6

## 2015-03-14 NOTE — Patient Instructions (Addendum)
How and Where to Give Subcutaneous Enoxaparin Injections  Enoxaparin is an injectable medicine. It is used to help prevent blood clots from developing in your veins. Health care providers often use anticoagulants like enoxaparin to prevent clots following surgery. Enoxaparin is also used in combination with other medicines to treat blood clots and heart attacks. If blood clots are left untreated, they can be life threatening.   Enoxaparin comes in single-use syringes. You inject enoxaparin through a syringe into your belly (abdomen). You should change the injection site each time you give yourself a shot. Continue the enoxaparin injections as directed by your health care provider. Your health care provider will use blood clotting test results to decide when you can safely stop using enoxaparin injections. If your health care provider prescribes any additional medicines, use the medicines exactly as directed.  HOW DO I INJECT ENOXAPARIN?   1. Wash your hands with soap and water.  2. Clean the selected injection site as directed by your health care provider.  3. Remove the needle cap by pulling it straight off the syringe.  4. Hold the syringe like a pencil using your writing hand.  5. Use your other hand to pinch and hold an inch of the cleansed skin.  6. Insert the entire needle straight down into the fold of skin.  7. Push the plunger with your thumb until the syringe is empty.  8. Pull the needle straight out of your skin.  9. Enoxaparin injection prefilled syringes and graduated prefilled syringes are available with a system that shields the needle after injection. After you have completed your injection and removed the needle from your skin, firmly push down on the plunger. The protective sleeve will automatically cover the needle and you will hear a click. The click means the needle is safely covered.  10. Place the syringe in the nearest needle box, also called a sharps container. If you do not have a sharps  container, you can use a hard-sided plastic container with a secure lid, such as an empty laundry detergent bottle.  WHAT ELSE DO I NEED TO KNOW?   Do not use enoxaparin if:    You have allergies to heparin or pork products.    You have been diagnosed with a condition called thrombocytopenia.   Do not use the syringe or needle more than one time.   Use medicines only as directed by your health care provider.   Changes in medicines, supplements, diet, and illness can affect your anticoagulation therapy. Be sure to inform your health care provider of any of these changes.   It is important that you tell all of your health care providers and your dentist that you are taking an anticoagulant, especially if you are injured or plan to have any type of procedure.   While on anticoagulants, you will need to have blood tests done routinely as directed by your health care provider.   While using this medicine, avoid physical activities or sports that could result in a fall or cause injury.   Follow up with your laboratory test and health care provider appointments as directed. It is very important to keep your appointments. Not keeping appointments could result in a chronic or permanent injury, pain, or disability.   Before giving your medicine, you should make sure the injection is a clear and colorless or pale yellow solution. If your medicine becomes discolored or if there are particles in the syringe, do not use it and notify your health   care provider.   Keep your medicine safely stored at room temperature.  SEEK MEDICAL CARE IF:   You develop any rashes on your skin.   You have large areas of bruising on your skin.   You have any worsening of the condition for which you take Enoxaparin.   You develop a fever.  SEEK IMMEDIATE MEDICAL CARE IF:   You develop bleeding problems such as:    Bleeding from the gums or nose that does not stop quickly.    Vomiting blood or coughing up blood.    Blood in your  urine.    Blood in your stool, or stool that has a dark, tarry, or coffee grounds appearance.    A cut that does not stop bleeding within 10 minutes.  These symptoms may represent a serious problem that is an emergency. Do not wait to see if the symptoms will go away. Get medical help right away. Call your local emergency services (911 in the [place] Do not drive yourself to the hospital.      This information is not intended to replace advice given to you by your health care provider. Make sure you discuss any questions you have with your health care provider.     Document Released: 12/20/2003 Document Revised: 03/10/2014 Document Reviewed: 08/04/2013  Elsevier Interactive Patient Education 2016 [employer]

## 2015-03-14 NOTE — Progress Notes (Signed)
Reviewed Patient with recurrent DVT, Protein S deficiency. Recently started on anti-TB meds. Now subtherapeutic warfarin X 2 week. Please start lovenox bridge today and continue until warfarin therapeutic. Discussed with pharmacist DrG

## 2015-03-14 NOTE — Progress Notes (Addendum)
Anticoagulation Management Paul Ryan is a 70 y.o. male who reports to the clinic for monitoring of warfarin treatment.    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  Anticoagulation Clinic Visit History: Anticoagulation Episode Summary    Current INR goal 2.0-3.0  Next INR check 03/21/2015  INR from last check 1.6! 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]        Comments        ASSESSMENT Recent Results: Recent results are below, the most recent result is correlated with a dose of 22.5 mg per week: Lab Results  Component Value Date   INR 1.6 03/14/2015   INR 1.9 03/08/2015   INR 2.3 12/22/2014   PROTIME 15.6* 05/17/2013   INR today: Subtherapeutic  Anticoagulation Dosing: INR as of 03/14/2015 and Previous Dosing Information    INR Dt INR Goal [name] [name]   03/14/2015 1.6 2.0-3.0 22.5 mg 2.5 mg 5 mg 2.5 mg 2.5 mg 5 mg 2.5 mg 2.5 mg    Previous description        Continue Coumadin 5 mg on Monday and Thursdays and 2.5 mg all other days    Anticoagulation Dose Instructions as of 03/14/2015      Total [name]   New Dose 25 mg 2.5 mg 5 mg 2.5 mg 5 mg 2.5 mg 5 mg 2.5 mg     (5 mg x 0.5)  (5 mg x 1)  (5 mg x 0.5)  (5 mg x 1)  (5 mg x 0.5)  (5 mg x 1)  (5 mg x 0.5)                           PLAN Weekly dose was increased by 10% to 25 mg per week. Started patient on lovenox bridge 1.5 mg/kg/day (120mg /day).  Educated patient on injection technique including preparation, administration, and disposal. Reinforced importance of medication therapy including indication, dosing, and possible adverse effects. An education handout was  provided.  Patient did correctly demonstrate injection technique.   Patient was advised to follow up in 5-7 days or sooner if any changes in condition or questions regarding medications arise.   The patient verbalized understanding of information provided by repeating back concepts discussed.   Patient educated about medication as defined in this encounter and verbalized understanding by repeating back instructions provided.   Medication Samples have been provided to the patient.  Drug: enoxaparin Strength: 120 mg Qty: 10 [id number] Exp.Date: 12/2015  The patient has been instructed regarding the correct time, dose, and frequency of taking this medication, including desired effects and most common side effects.   Patient Instructions  Patient educated about medication as defined in this encounter and verbalized understanding by repeating back instructions provided.   Follow-up Return in about 1 week (around 03/21/2015).  Paul Ryan J  30 minutes spent face-to-face with the patient during the encounter. 50% of time spent on education. 50% of time was spent on assessment and plan.

## 2015-03-14 NOTE — Progress Notes (Signed)
Patient ID: Paul Ryan, male   DOB: Sep 04, 1945, 70 y.o.   MRN: [id number] Patient started on TB prophylaxis treatment with isoniazid and rifapentine ten days ago for a 12 week duration. INR 03/12/15 was 1.9 and no changes were made to his warfarin regimen. INR today (03/14/15) was 1.6. The patient's weekly warfarin dose was increased by 10% from 22.5mg /week to 25mg /week. New warfarin regimen as follows: 5mg  warfarin Mon, Wed, and Fri. 2.5mg  warfarin Sun, Tues, Thurs, and Sat. Will follow up with coagulation visit on 03/21/15.

## 2015-03-15 ENCOUNTER — Encounter: Payer: Self-pay | Admitting: Pharmacist

## 2015-03-15 ENCOUNTER — Encounter: Payer: Self-pay | Admitting: Family Medicine

## 2015-03-15 NOTE — Addendum Note (Signed)
Addended by: Forde Dandy on: 03/15/2015 02:36 PM   Modules accepted: Medications

## 2015-03-15 NOTE — Progress Notes (Signed)
Reviewed DrG 

## 2015-03-17 ENCOUNTER — Encounter: Payer: Self-pay | Admitting: Family Medicine

## 2015-03-19 ENCOUNTER — Other Ambulatory Visit: Payer: Self-pay | Admitting: Family Medicine

## 2015-03-19 DIAGNOSIS — J392 Other diseases of pharynx: Secondary | ICD-10-CM

## 2015-03-20 ENCOUNTER — Ambulatory Visit (INDEPENDENT_AMBULATORY_CARE_PROVIDER_SITE_OTHER): Payer: Medicare Other | Admitting: Pharmacist

## 2015-03-20 DIAGNOSIS — D6859 Other primary thrombophilia: Secondary | ICD-10-CM

## 2015-03-20 DIAGNOSIS — D689 Coagulation defect, unspecified: Secondary | ICD-10-CM

## 2015-03-20 DIAGNOSIS — Z7901 Long term (current) use of anticoagulants: Secondary | ICD-10-CM | POA: Diagnosis not present

## 2015-03-20 DIAGNOSIS — D688 Other specified coagulation defects: Secondary | ICD-10-CM

## 2015-03-20 DIAGNOSIS — I2699 Other pulmonary embolism without acute cor pulmonale: Secondary | ICD-10-CM

## 2015-03-20 LAB — POCT INR: INR: 1.6

## 2015-03-20 NOTE — Patient Instructions (Signed)
Patient educated about medication as defined in this encounter and verbalized understanding by repeating back instructions provided.   

## 2015-03-21 NOTE — Progress Notes (Signed)
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  INR Below target.  Agree with Ms. Makhlouf/Dr. [name]'s assessment and plan as documented.

## 2015-03-21 NOTE — Progress Notes (Signed)
Patient ID: Paul Ryan, male   DOB: June 10, 1945, 70 y.o.   MRN: [id number] Anticoagulation Management Paul Ryan is a 70 y.o. male who reports to the clinic for monitoring of warfarin treatment.    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  Anticoagulation Clinic Visit History: Patient does not report signs/symptoms of bleeding or thromboembolism  Other recent changes: began TB prophylaxis treatment with Isoniazid and Rifapentine  Anticoagulation Episode Summary    Current INR goal 2.0-3.0  Next INR check 03/23/2015  INR from last check 1.6! 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]        Comments        ASSESSMENT Recent Results: Recent results are below, the most recent result is correlated with a dose of 25 mg per week: Lab Results  Component Value Date   INR 1.6 03/20/2015   INR 1.6 03/14/2015   INR 1.9 03/08/2015   PROTIME 15.6* 05/17/2013    INR today: Subtherapeutic  Anticoagulation Dosing: INR as of 03/20/2015 and Previous Dosing Information    INR Dt INR Goal [name] [name]   03/20/2015 1.6 2.0-3.0 25 mg 2.5 mg 5 mg 2.5 mg 5 mg 2.5 mg 5 mg 2.5 mg    Anticoagulation Dose Instructions as of 03/20/2015      Total [name]   New Dose 30 mg 2.5 mg 5 mg 5 mg 5 mg 5 mg 5 mg 2.5 mg     (5 mg x 0.5)  (5 mg x 1)  (5 mg x 1)  (5 mg x 1)  (5 mg x 1)  (5 mg x 1)  (5 mg x 0.5)                           PLAN Weekly dose was increased by 10% to 30 mg per week  Patient Instructions  Patient educated about medication as defined in this encounter and verbalized understanding by repeating back instructions provided.   Patient will continue  to take lovenox injections qd until follow-up.  Follow-up Return in about 3 days (around 03/23/2015) for Follow up INR 03/23/15 at [place]  30 minutes spent face-to-face with the patient during the encounter. 75% of time spent on education. 25% of time was spent on assessment and plan.

## 2015-03-22 NOTE — Progress Notes (Signed)
Patient was seen in clinic with [name] Makhlouf, PharmD candidate. I agree with the assessment and plan of care documented by [name]. 

## 2015-03-23 ENCOUNTER — Telehealth: Payer: Self-pay | Admitting: Family Medicine

## 2015-03-23 ENCOUNTER — Encounter: Payer: Self-pay | Admitting: Family Medicine

## 2015-03-23 ENCOUNTER — Ambulatory Visit (INDEPENDENT_AMBULATORY_CARE_PROVIDER_SITE_OTHER): Payer: Medicare Other

## 2015-03-23 ENCOUNTER — Encounter (HOSPITAL_BASED_OUTPATIENT_CLINIC_OR_DEPARTMENT_OTHER): Payer: Self-pay

## 2015-03-23 ENCOUNTER — Ambulatory Visit (INDEPENDENT_AMBULATORY_CARE_PROVIDER_SITE_OTHER): Payer: Medicare Other | Admitting: Family Medicine

## 2015-03-23 ENCOUNTER — Ambulatory Visit (HOSPITAL_BASED_OUTPATIENT_CLINIC_OR_DEPARTMENT_OTHER)
Admission: RE | Admit: 2015-03-23 | Discharge: 2015-03-23 | Disposition: A | Discharge status: Home / Self Care | Payer: Medicare Other | Source: Ambulatory Visit | Attending: Family Medicine | Admitting: Family Medicine

## 2015-03-23 VITALS — BP 112/72 | HR 67 | Temp 98.6°F | Ht 71.0 in | Wt 165.5 lb

## 2015-03-23 DIAGNOSIS — M353 Polymyalgia rheumatica: Secondary | ICD-10-CM

## 2015-03-23 DIAGNOSIS — R112 Nausea with vomiting, unspecified: Secondary | ICD-10-CM | POA: Insufficient documentation

## 2015-03-23 DIAGNOSIS — I2699 Other pulmonary embolism without acute cor pulmonale: Secondary | ICD-10-CM | POA: Diagnosis not present

## 2015-03-23 DIAGNOSIS — D688 Other specified coagulation defects: Secondary | ICD-10-CM | POA: Insufficient documentation

## 2015-03-23 DIAGNOSIS — R1084 Generalized abdominal pain: Secondary | ICD-10-CM | POA: Insufficient documentation

## 2015-03-23 DIAGNOSIS — R05 Cough: Secondary | ICD-10-CM

## 2015-03-23 DIAGNOSIS — Z111 Encounter for screening for respiratory tuberculosis: Secondary | ICD-10-CM

## 2015-03-23 DIAGNOSIS — D6859 Other primary thrombophilia: Secondary | ICD-10-CM

## 2015-03-23 DIAGNOSIS — R058 Other specified cough: Secondary | ICD-10-CM

## 2015-03-23 DIAGNOSIS — Z7901 Long term (current) use of anticoagulants: Secondary | ICD-10-CM | POA: Insufficient documentation

## 2015-03-23 DIAGNOSIS — J9811 Atelectasis: Secondary | ICD-10-CM | POA: Insufficient documentation

## 2015-03-23 DIAGNOSIS — A150 Tuberculosis of lung: Secondary | ICD-10-CM | POA: Diagnosis not present

## 2015-03-23 DIAGNOSIS — I2782 Chronic pulmonary embolism: Secondary | ICD-10-CM | POA: Diagnosis not present

## 2015-03-23 DIAGNOSIS — R0600 Dyspnea, unspecified: Secondary | ICD-10-CM

## 2015-03-23 DIAGNOSIS — R0602 Shortness of breath: Secondary | ICD-10-CM | POA: Diagnosis not present

## 2015-03-23 DIAGNOSIS — E039 Hypothyroidism, unspecified: Secondary | ICD-10-CM | POA: Diagnosis not present

## 2015-03-23 DIAGNOSIS — E785 Hyperlipidemia, unspecified: Secondary | ICD-10-CM

## 2015-03-23 DIAGNOSIS — I82409 Acute embolism and thrombosis of unspecified deep veins of unspecified lower extremity: Secondary | ICD-10-CM

## 2015-03-23 DIAGNOSIS — I2510 Atherosclerotic heart disease of native coronary artery without angina pectoris: Secondary | ICD-10-CM | POA: Diagnosis not present

## 2015-03-23 DIAGNOSIS — R7612 Nonspecific reaction to cell mediated immunity measurement of gamma interferon antigen response without active tuberculosis: Secondary | ICD-10-CM

## 2015-03-23 DIAGNOSIS — A159 Respiratory tuberculosis unspecified: Secondary | ICD-10-CM

## 2015-03-23 DIAGNOSIS — R739 Hyperglycemia, unspecified: Secondary | ICD-10-CM

## 2015-03-23 DIAGNOSIS — D689 Coagulation defect, unspecified: Secondary | ICD-10-CM

## 2015-03-23 LAB — COMPREHENSIVE METABOLIC PANEL
ALK PHOS: 48 U/L (ref 39–117)
ALT: 87 U/L — AB (ref 0–53)
AST: 59 U/L — ABNORMAL HIGH (ref 0–37)
Albumin: 3.9 g/dL (ref 3.5–5.2)
BILIRUBIN TOTAL: 1.1 mg/dL (ref 0.2–1.2)
BUN: 14 mg/dL (ref 6–23)
CALCIUM: 9.1 mg/dL (ref 8.4–10.5)
CHLORIDE: 103 meq/L (ref 96–112)
CO2: 24 meq/L (ref 19–32)
Creatinine, Ser: 1.1 mg/dL (ref 0.40–1.50)
GFR: 70.22 mL/min (ref >60.00)
GLUCOSE: 119 mg/dL — AB (ref 70–99)
Potassium: 4.4 mEq/L (ref 3.5–5.1)
Sodium: 136 mEq/L (ref 135–145)
Total Protein: 7.6 g/dL (ref 6.0–8.3)

## 2015-03-23 LAB — CBC WITH DIFFERENTIAL/PLATELET
BASOS ABS: 0 10*3/uL (ref 0.0–0.1)
BASOS PCT: 0.4 % (ref 0.0–3.0)
EOS ABS: 0.2 10*3/uL (ref 0.0–0.7)
Eosinophils Relative: 2.6 % (ref 0.0–5.0)
HCT: 47.8 % (ref 39.0–52.0)
Hemoglobin: 15.9 g/dL (ref 13.0–17.0)
LYMPHS ABS: 0.9 10*3/uL (ref 0.7–4.0)
LYMPHS PCT: 12.9 % (ref 12.0–46.0)
MCHC: 33.3 g/dL (ref 30.0–36.0)
MCV: 90.2 fl (ref 78.0–100.0)
MONOS PCT: 17 % — AB (ref 3.0–12.0)
Monocytes Absolute: 1.2 10*3/uL — ABNORMAL HIGH (ref 0.1–1.0)
NEUTROS ABS: 4.8 10*3/uL (ref 1.4–7.7)
NEUTROS PCT: 67.1 % (ref 43.0–77.0)
PLATELETS: 166 10*3/uL (ref 150.0–400.0)
RBC: 5.3 Mil/uL (ref 4.22–5.81)
RDW: 14.2 % (ref 11.5–15.5)
WBC: 7.2 10*3/uL (ref 4.0–10.5)

## 2015-03-23 LAB — HEPATITIS PANEL, ACUTE
HCV Ab: NEGATIVE
HEP B S AG: NEGATIVE
Hep A IgM: NONREACTIVE
Hep B C IgM: NONREACTIVE

## 2015-03-23 LAB — SEDIMENTATION RATE: SED RATE: 39 mm/h — AB (ref 0–22)

## 2015-03-23 LAB — TSH: TSH: 1.06 u[IU]/mL (ref 0.35–4.50)

## 2015-03-23 LAB — PROTIME-INR
INR: 1.4 ratio — ABNORMAL HIGH (ref 0.8–1.0)
Prothrombin Time: 14.8 s — ABNORMAL HIGH (ref 9.6–13.1)

## 2015-03-23 MED ORDER — PROMETHAZINE HCL 25 MG PO TABS: 25.0000 mg | ORAL_TABLET | Freq: Three times a day (TID) | ORAL | Status: DC | PRN

## 2015-03-23 MED ORDER — IOHEXOL 350 MG/ML SOLN
100.0000 mL | Freq: Once | INTRAVENOUS | Status: AC | PRN
  Administered 2015-03-23: 100 mL via INTRAVENOUS

## 2015-03-23 MED ORDER — ONDANSETRON HCL 4 MG PO TABS: 4.0000 mg | ORAL_TABLET | Freq: Three times a day (TID) | ORAL | Status: DC | PRN

## 2015-03-23 MED ORDER — RANITIDINE HCL 300 MG PO TABS: 300.0000 mg | ORAL_TABLET | Freq: Every day | ORAL | Status: DC

## 2015-03-23 NOTE — Telephone Encounter (Signed)
The patients wife stated the patient is having terrible indigestion.  Advise on what to take, she stated he did mention it this morning at the appt.

## 2015-03-23 NOTE — Progress Notes (Signed)
Pre visit review using our clinic review tool, if applicable. No additional management support is needed unless otherwise documented below in the visit note. 

## 2015-03-23 NOTE — Progress Notes (Signed)
Patient was seen in clinic with Paul Ryan, PharmD candidate. I agree with the assessment and plan of care documented [name].

## 2015-03-23 NOTE — Progress Notes (Signed)
Subjective:    Patient ID: Paul Ryan, male    DOB: [birth year], 70 y.o.   MRN: [id number]  Chief Complaint  Patient presents with  . Medication Reaction    HPI Patient is in today for evaluation of numerous concerns.  Ordered by rheumatology prior to considering starting biologic agents for PMR. Was positive so patient referred to health department and started on Rifapentine and Isoniazid at 900 mg each he has undergone 3 doses and each time his side effects have become progressively worse. He endorses nausea, vomiting, lightheadedness, headache, disequilibrium, joint pains or myalgias. He is noting worsening shortness of breath and now also endorses fevers and vomiting. He is uninterested in further doses. Denies palp/HA/congestion/fevers/GI or GU c/o. Taking meds as prescribed Past Medical History  Diagnosis Date  . DVT (deep venous thrombosis) ([name])     takes coumadin  . Shingles 1982    right abdominal wall  . Allergy     seasonal  . Hyperlipidemia   . Left hip pain 12/17/2010  . Preventative health care 12/17/2010  . Coagulopathy ([place]) 05/16/2011  . Hereditary protein S deficiency ([place]) 05/16/2011  . Hypothyroid 06/10/2011  . Vitamin D deficiency 10/14/2011  . Dysuria 02/27/2013  . Chronic anticoagulation 06/14/2013  . Polymyalgia ([place]) 03/21/2014  . Collagen vascular disease ([place])   . Thyroid nodule 11/29/2014  . Hyperglycemia 12/26/2014    Past Surgical History  Procedure Laterality Date  . 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 Cancer Mother     unknown- everywhere  . Cancer Sister     breast    Social History   Social History  . Marital Status: Married    Spouse Name: N/A  . Number of Children: N/A  . Years of Education: N/A   Occupational History  . Not on file.   Social History Main Topics  . Smoking status: Former Smoker -- 1.00 packs/day for 20 years    Types: Cigarettes    Quit  date: 03/03/1980  . Smokeless tobacco: Never Used  . Alcohol Use: No  . Drug Use: No  . Sexual Activity: Not on file   Other Topics Concern  . Not on file   Social History Narrative    Outpatient Prescriptions Prior to Visit  Medication Sig Dispense Refill  . albuterol (PROVENTIL HFA;VENTOLIN HFA) 108 (90 BASE) MCG/ACT inhaler Inhale 2 puffs into the lungs every 4 (four) hours as needed for wheezing or shortness of breath. 1 Inhaler 3  . b complex vitamins tablet Take 1 tablet by mouth daily.      . Bromfenac Sodium 0.09 % SOLN INSTILL 1 DROP INTO OPERATIVE EYE 2 TIMES DAILY STARTING THE DAY OF SURGERY UNTIL BOTTLE IS EMPTY  2  . Cholecalciferol 4000 UNITS TABS Take 1 tablet by mouth daily.    . Coenzyme Q10 (COQ10 PO) Take 2 tablets by mouth daily.    . DUREZOL 0.05 % EMUL INSTILL 1 DROP TO OPERATED EYE 3 TIMES DAILY STARTING THE DAY OF SURGERY. TAPER AS DIRECTED.  2  . enoxaparin (LOVENOX) 120 MG/0.8ML injection Inject 120 mg into the skin daily.    Javier Docker Oil 300 MG CAPS 1 cap daily    . montelukast (SINGULAIR) 10 MG tablet Take 1 tablet (10 mg total) by mouth at bedtime. 90 tablet 3  . NIACIN PO Take 1 tablet by mouth 3 (three)  times daily.     . predniSONE (DELTASONE) 10 MG tablet Take 1 tablet (10 mg total) by mouth daily with breakfast. 30 tablet 0  . rosuvastatin (CRESTOR) 20 MG tablet Take 1 tablet (20 mg total) by mouth at bedtime. 90 tablet 1  . scopolamine (TRANSDERM-SCOP, 1.5 MG,) 1 MG/3DAYS Place 1 patch (1.5 mg total) onto the skin every 3 (three) days. 2 patch 0  . tacrolimus (PROTOPIC) 0.1 % ointment Apply topically 2 (two) times daily. 100 g 1  . thyroid (ARMOUR THYROID) 60 MG tablet Take 1 tablet (60 mg total) by mouth every other day. Alternates with 90mg  every other day. 45 tablet 6  . thyroid (ARMOUR THYROID) 90 MG tablet Alternate this with Armour 60 mg 1 tab qod 45 tablet 6  . trimethoprim-polymyxin b (POLYTRIM) ophthalmic solution INSTILL 1 DROP TO OPERATED EYE  3 TIMES DAILY STARTING THE DAY OF SURGERY. TAPER AS DIRECTED.  2  . valACYclovir (VALTREX) 1000 MG tablet Take 1 tablet (1,000 mg total) by mouth 3 (three) times daily. 30 tablet 0  . warfarin (COUMADIN) 5 MG tablet Take 5 mg Monday and Thursday, take 2.5 mg all other days. 90 tablet 1   No facility-administered medications prior to visit.    No Known Allergies  Review of Systems  Constitutional: Positive for fever and malaise/fatigue.  HENT: Negative for congestion.   Eyes: Negative for discharge.  Respiratory: Negative for shortness of breath.   Cardiovascular: Negative for chest pain, palpitations and leg swelling.  Gastrointestinal: Positive for heartburn, nausea and vomiting. Negative for abdominal pain.  Genitourinary: Negative for dysuria.  Musculoskeletal: Positive for myalgias and joint pain. Negative for falls.  Skin: Negative for rash.  Neurological: Negative for loss of consciousness and headaches.  Endo/Heme/Allergies: Negative for environmental allergies.  Psychiatric/Behavioral: Negative for depression. The patient is not nervous/anxious.        Objective:    Physical Exam  Constitutional: He is oriented to person, place, and time. He appears well-developed and well-nourished. No distress.  HENT:  Head: Normocephalic and atraumatic.  Nose: Nose normal.  Eyes: Right eye exhibits no discharge. Left eye exhibits no discharge.  Neck: Normal range of motion. Neck supple.  Cardiovascular: Normal rate and regular rhythm.   No murmur heard. Pulmonary/Chest: Effort normal and breath sounds normal.  Abdominal: Soft. Bowel sounds are normal. There is no tenderness.  Musculoskeletal: He exhibits no edema.  Neurological: He is alert and oriented to person, place, and time.  Skin: Skin is warm and dry.  Psychiatric: He has a normal mood and affect.  Nursing note and vitals reviewed.   BP 112/72 mmHg  Pulse 67  Temp(Src) 98.6 F (37 C) (Oral)  Ht 5\' 11"  (1.803 m)  Wt  165 lb 8 oz (75.07 kg)  BMI 23.09 kg/m2  SpO2 97% Wt Readings from Last 3 Encounters:  03/23/15 165 lb 8 oz (75.07 kg)  03/15/15 173 lb (78.472 kg)  01/09/15 167 lb 3.2 oz (75.841 kg)     Lab Results  Component Value Date   WBC 8.0 11/21/2014   HGB 15.1 11/21/2014   HCT 45.5 11/21/2014   PLT 266.0 11/21/2014   GLUCOSE 108* 11/21/2014   CHOL 245* 11/21/2014   TRIG 86.0 11/21/2014   HDL 37.10* 11/21/2014   LDLDIRECT 158.0 03/11/2013   LDLCALC 191* 11/21/2014   ALT 28 11/21/2014   AST 19 11/21/2014   NA 140 11/21/2014   K 3.7 11/21/2014   CL 103 11/21/2014  CREATININE 0.95 11/21/2014   BUN 15 11/21/2014   CO2 27 11/21/2014   TSH 0.47 11/21/2014   PSA 1.63 10/14/2011   INR 1.6 03/20/2015   HGBA1C 6.3 03/11/2013    Lab Results  Component Value Date   TSH 0.47 11/21/2014   Lab Results  Component Value Date   WBC 8.0 11/21/2014   HGB 15.1 11/21/2014   HCT 45.5 11/21/2014   MCV 92.1 11/21/2014   PLT 266.0 11/21/2014   Lab Results  Component Value Date   NA 140 11/21/2014   K 3.7 11/21/2014   CHLORIDE 109 11/16/2012   CO2 27 11/21/2014   GLUCOSE 108* 11/21/2014   BUN 15 11/21/2014   CREATININE 0.95 11/21/2014   BILITOT 0.6 11/21/2014   ALKPHOS 50 11/21/2014   AST 19 11/21/2014   ALT 28 11/21/2014   PROT 8.2 11/21/2014   ALBUMIN 4.0 11/21/2014   CALCIUM 9.7 11/21/2014   ANIONGAP 8 09/11/2014   GFR 83.25 11/21/2014   Lab Results  Component Value Date   CHOL 245* 11/21/2014   Lab Results  Component Value Date   HDL 37.10* 11/21/2014   Lab Results  Component Value Date   LDLCALC 191* 11/21/2014   Lab Results  Component Value Date   TRIG 86.0 11/21/2014   Lab Results  Component Value Date   CHOLHDL 7 11/21/2014   Lab Results  Component Value Date   HGBA1C 6.3 03/11/2013       Assessment & Plan:   Problem List Items Addressed This Visit    None      I am having Mr. Scalia maintain his b complex vitamins, Krill Oil, tacrolimus, NIACIN  PO, Cholecalciferol, Coenzyme Q10 (COQ10 PO), albuterol, scopolamine, trimethoprim-polymyxin b, DUREZOL, Bromfenac Sodium, predniSONE, thyroid, thyroid, valACYclovir, montelukast, warfarin, rosuvastatin, and enoxaparin.  No orders of the defined types were placed in this encounter.    Willette Alma, MD

## 2015-03-23 NOTE — Patient Instructions (Signed)
Patient educated about medication as defined in this encounter and verbalized understanding by repeating back instructions provided.   

## 2015-03-23 NOTE — Telephone Encounter (Signed)
Caller name: Jeani Hawking  Relationship to patient: Spouse   Can be reached: [phone]  Reason for call: Pt's spouse need to be seen next week per CMA. Could pt be seen on Monday at one of your closed slots? ( 9:15,11:15)    Please advise for scheduling.    Thanks.

## 2015-03-23 NOTE — Telephone Encounter (Signed)
Yes use either slot

## 2015-03-23 NOTE — Patient Instructions (Signed)

## 2015-03-23 NOTE — Progress Notes (Signed)
Reviewed Thanks DrG 

## 2015-03-23 NOTE — Telephone Encounter (Signed)
Start Ranitidine 300 mg tab 1 tab po daily, does not interact with coumadin. Disp #30 with 2 rf

## 2015-03-23 NOTE — Progress Notes (Signed)
Anticoagulation Management Paul Ryan is a 70 y.o. male who reports to the clinic for monitoring of warfarin treatment.    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  Anticoagulation Clinic Visit History: Patient does not report signs/symptoms of bleeding or thromboembolism  Other recent changes: has discontinued isoniazed and rifapentine due to adverse drug reaction  Anticoagulation Episode Summary    Current INR goal 2.0-3.0  Next INR check 03/26/2015  INR from last check 1.4! (03/23/2015)  Most recent INR 1.6! 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]        Comments        ASSESSMENT Recent Results: Recent results are below, the most recent result is correlated with a dose of 30 mg per week: Lab Results  Component Value Date   INR 1.4* 03/23/2015   INR 1.6 03/20/2015   INR 1.6 03/14/2015   PROTIME 15.6* 05/17/2013    INR today: Subtherapeutic  Anticoagulation Dosing: INR as of 03/23/2015 and Previous Dosing Information    INR Dt INR Goal [name] [name]   03/23/2015 1.4 2.0-3.0 30 mg 5 mg 5 mg 5 mg 5 mg 5 mg 5 mg 5 mg    Anticoagulation Dose Instructions as of 03/23/2015      Total [name]   New Dose 37.5 mg 5 mg  5 mg 5 mg 5 mg 5 mg 7.5 mg 5 mg     (5 mg x 1)  (5 mg x 1)  (5 mg x 1)  (5 mg x 1)  (5 mg x 1)  (5 mg x 1.5)  (5 mg x 1)                           PLAN Weekly dose was increased by 25% to 37.5 mg per week  The patient was seen by PCP due to flu-like symptoms. Chest CT negative for clots. INR was obtained by PCP and reported to clinic for dose adjustments. Provided the patient with more lovenox shots, which he will  continue to take qd until follow-up. Patient states that he will not continue to take rifapentine and isoniazid due to adverse drug effects.    Follow-up Will follow-up on Monday 03/26/15 in clinic.    Paul Ryan

## 2015-03-23 NOTE — Telephone Encounter (Signed)
Prescription sent in and patient/wife aware.

## 2015-03-24 LAB — QUANTIFERON TB GOLD ASSAY (BLOOD)
Interferon Gamma Release Assay: NEGATIVE
Mitogen value: 3.55 IU/mL
QUANTIFERON TB AG MINUS NIL: 0.11 [IU]/mL
Quantiferon Nil Value: 0.1 IU/mL
TB Ag value: 0.21 IU/mL

## 2015-03-26 ENCOUNTER — Telehealth: Payer: Self-pay | Admitting: Behavioral Health

## 2015-03-26 ENCOUNTER — Other Ambulatory Visit (INDEPENDENT_AMBULATORY_CARE_PROVIDER_SITE_OTHER): Payer: Medicare Other

## 2015-03-26 ENCOUNTER — Ambulatory Visit (INDEPENDENT_AMBULATORY_CARE_PROVIDER_SITE_OTHER): Payer: Medicare Other | Admitting: Pharmacist

## 2015-03-26 DIAGNOSIS — D688 Other specified coagulation defects: Secondary | ICD-10-CM

## 2015-03-26 DIAGNOSIS — D6859 Other primary thrombophilia: Secondary | ICD-10-CM

## 2015-03-26 DIAGNOSIS — Z7901 Long term (current) use of anticoagulants: Secondary | ICD-10-CM

## 2015-03-26 DIAGNOSIS — D689 Coagulation defect, unspecified: Secondary | ICD-10-CM

## 2015-03-26 DIAGNOSIS — I2782 Chronic pulmonary embolism: Secondary | ICD-10-CM

## 2015-03-26 DIAGNOSIS — I82401 Acute embolism and thrombosis of unspecified deep veins of right lower extremity: Secondary | ICD-10-CM

## 2015-03-26 LAB — TB SKIN TEST
INDURATION: 4 mm
TB Skin Test: NEGATIVE

## 2015-03-26 LAB — POCT INR: INR: 2

## 2015-03-26 MED ORDER — WARFARIN SODIUM 5 MG PO TABS: ORAL_TABLET | ORAL | Status: DC

## 2015-03-26 NOTE — Patient Instructions (Signed)
Patient educated about medication as defined in this encounter and verbalized understanding by repeating back instructions provided.   

## 2015-03-26 NOTE — Progress Notes (Signed)
Reviewed and agree Thanks Dr G 

## 2015-03-26 NOTE — Telephone Encounter (Signed)
So please call health department and let them know that he was very ill on Friday when we saw him so we rechecked a Quantiferon and a PPD skin test. Fax them results. Quantiferon was neg and PPD was only 4 mm with some mild surrounding redness. He is hesitant to restart treatment given how sick he was. I will happily refer him to ID if they think that would help please let me know their response.

## 2015-03-26 NOTE — Telephone Encounter (Signed)
LVM for pt to call back and schedule.

## 2015-03-26 NOTE — Progress Notes (Signed)
Patient was seen in clinic with [name] Makhlouf, PharmD candidate. I agree with the assessment and plan of care documented by [name]. 

## 2015-03-26 NOTE — Telephone Encounter (Signed)
Spoke to [employer] at [facility].  Informed of results per PCP instructions.  Will fax over results to [phone] .  RN at the health Dept. Did request MD to call PCP to discuss as patient was instructed to never get another PPD test due to his previous result being positive.  She stated he was told that he would always test positive in the future. RN requested PCP's contact information in order for the MD at the health department to contact. Informed RN PCP at this time was seeing patients and would give information to PCP. Call back number at health department [phone] or [place].

## 2015-03-26 NOTE — Progress Notes (Signed)
Patient ID: Paul Ryan, male   DOB: 02/27/1946, 70 y.o.   MRN: [id number] Anticoagulation Management Paul Ryan is a 70 y.o. male who reports to the clinic for monitoring of warfarin treatment.    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  Anticoagulation Clinic Visit History: Patient does not report signs/symptoms of bleeding or thromboembolism  Other recent changes: Patient has stopped rifapentine and isoniazid. Patient will no longer take lovenox injections.  Anticoagulation Episode Summary    Current INR goal 2.0-3.0  Next INR check 03/29/2015  INR from last check 1.4! 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]        Comments        ASSESSMENT Recent Results: Recent results are below, the most recent result is correlated with a dose of 32.5 mg per week: Lab Results  Component Value Date   INR 2.0 03/26/2015   INR 1.4* 03/23/2015   INR 1.6 03/20/2015   INR 1.6 03/14/2015   PROTIME 15.6* 05/17/2013    INR today: Therapeutic  Anticoagulation Dosing: INR as of 03/26/2015 and Previous Dosing Information    INR Dt INR Goal [name] [name]   03/26/2015 2.0 2.0-3.0 32.5 mg 5 mg 5 mg 5 mg 5 mg 5 mg 7.5 mg 5 mg    Anticoagulation Dose Instructions as of 03/26/2015      Total [name]   New Dose 32.5 mg 5 mg 5 mg 5 mg 5 mg 5 mg 7.5 mg 5 mg     (5 mg x 1)  (5 mg x 1)  (5 mg x 1)  (5 mg x 1)  (5 mg x 1)  (5 mg x 1.5)  (5 mg x 1)                           PLAN Weekly dose was unchanged by 0% to 32.5 mg per week  Patient has stopped lovenox injections and will no longer take antibiotics.  Follow-up Follow-up on 03/29/2015   [name]  Makhlouf  20 minutes spent face-to-face with the patient during the encounter. 50% of time spent on education. 50% of time was spent on plan and assessment.

## 2015-03-26 NOTE — Telephone Encounter (Signed)
Patient in office today to have PPD read per the provider's recommendations below.  Notes Recorded by [name], MD on 03/25/2015 at 8:19 AM His Quantiferon TB Ag test was negative on repeat we will need to contact health department to let them know after his TB test is read to help [place] plan his further care.  Site assessed and findings verified with PCP present. Results were as follow: 4 mm firmness (induration) and 20 mm (red). Dr. Charlett Blake informed patient that she will follow-up with the health department regarding his results. He understood. Other than the patient's concerns about medication treatment and future labwork, he did not have any further questions prior to leaving the office.

## 2015-03-27 ENCOUNTER — Encounter: Payer: Self-pay | Admitting: Family Medicine

## 2015-03-27 ENCOUNTER — Other Ambulatory Visit: Payer: Medicare Other

## 2015-03-27 LAB — CBC WITH DIFFERENTIAL/PLATELET
Basophils Absolute: 0 10*3/uL (ref 0.0–0.2)
Basos: 0 %
EOS (ABSOLUTE): 0.3 10*3/uL (ref 0.0–0.4)
Eos: 5 %
Hematocrit: 45.5 % (ref 37.5–51.0)
Hemoglobin: 15.6 g/dL (ref 12.6–17.7)
IMMATURE GRANULOCYTES: 0 %
Immature Grans (Abs): 0 10*3/uL (ref 0.0–0.1)
Lymphocytes Absolute: 1.9 10*3/uL (ref 0.7–3.1)
Lymphs: 34 %
MCH: 30.2 pg (ref 26.6–33.0)
MCHC: 34.3 g/dL (ref 31.5–35.7)
MCV: 88 fL (ref 79–97)
MONOS ABS: 0.4 10*3/uL (ref 0.1–0.9)
Monocytes: 7 %
NEUTROS PCT: 54 %
Neutrophils Absolute: 3 10*3/uL (ref 1.4–7.0)
PLATELETS: 219 10*3/uL (ref 150–379)
RBC: 5.17 x10E6/uL (ref 4.14–5.80)
RDW: 14 % (ref 12.3–15.4)
WBC: 5.6 10*3/uL (ref 3.4–10.8)

## 2015-03-27 NOTE — Telephone Encounter (Signed)
Spoke with Dr Santo Held at length and they have agreed that they may try and change his regimen due to his escalating symptoms with each treatment. Will contact patient

## 2015-03-27 NOTE — Telephone Encounter (Signed)
Spoke with patient explained the complexities of his case I recommended he call health department and consider a different course of treatment for his positive Quantiferon, he is in agreement.

## 2015-03-27 NOTE — Telephone Encounter (Signed)
Dr Patience Musca with [facility], wants to talk about repeating test, please call her asap @ [phone]

## 2015-03-29 ENCOUNTER — Ambulatory Visit (INDEPENDENT_AMBULATORY_CARE_PROVIDER_SITE_OTHER): Payer: Medicare Other | Admitting: Pharmacist

## 2015-03-29 DIAGNOSIS — I2699 Other pulmonary embolism without acute cor pulmonale: Secondary | ICD-10-CM

## 2015-03-29 DIAGNOSIS — D6859 Other primary thrombophilia: Secondary | ICD-10-CM

## 2015-03-29 DIAGNOSIS — Z7901 Long term (current) use of anticoagulants: Secondary | ICD-10-CM

## 2015-03-29 DIAGNOSIS — D689 Coagulation defect, unspecified: Secondary | ICD-10-CM

## 2015-03-29 DIAGNOSIS — D688 Other specified coagulation defects: Secondary | ICD-10-CM

## 2015-03-29 LAB — POCT INR: INR: 1.7

## 2015-03-29 NOTE — Progress Notes (Signed)
Patient was seen in clinic with [name] Makhlouf, PharmD candidate. I agree with the assessment and plan of care documented by [name]. 

## 2015-03-29 NOTE — Progress Notes (Signed)
Reviewed Thanks DrG 

## 2015-03-29 NOTE — Progress Notes (Signed)
Patient ID: Paul Ryan, male   DOB: 04-21-1945, 70 y.o.   MRN: [id number] Anticoagulation Management Paul Ryan is a 70 y.o. male who reports to the clinic for monitoring of warfarin treatment.    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  Anticoagulation Clinic Visit History: Patient does not report signs/symptoms of bleeding or thromboembolism  Other recent changes: recently began taking ranitidine for acid reflux  Anticoagulation Episode Summary    Current INR goal 2.0-3.0  Next INR check 04/03/2015  INR from last check 2.0 (03/26/2015)  Weekly max dose   Target end date   INR check location   Preferred lab   Send INR reminders to    Indications  Chronic anticoagulation [Z79.01] Coagulopathy ([place]) [[name]] DVT (deep venous thrombosis) ([place]) [[name]] Pulmonary embolism ([place]) [[name]] 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]        Comments        ASSESSMENT Recent Results: Recent results are below, the most recent result is correlated with a dose of 37.5 mg per week: Lab Results  Component Value Date   INR 1.7 03/29/2015   INR 2.0 03/26/2015   INR 1.4* 03/23/2015   INR 1.6 03/20/2015   PROTIME 15.6* 05/17/2013    INR today: Subtherapeutic  Anticoagulation Dosing: INR as of 03/29/2015 and Previous Dosing Information    INR Dt INR Goal [name] [name]   03/29/2015 1.7 2.0-3.0 37.5 mg 5 mg 5 mg 5 mg 5 mg 5 mg 7.5 mg 5 mg          Anticoagulation Dose Instructions as of 03/26/2015      Total [name]   New Dose 42.5 mg 5 mg 7.5 mg 5 mg 5 mg 7.5 mg 5 mg 7.5 mg     (5 mg x 1)  (5 mg x 1.5)  (5 mg x 1)  (5 mg x 1)  (5 mg x 1.5)  (5 mg x 1)  (5 mg x 1.5)                           PLAN Weekly dose was increased by 15% to 42.5 mg per week  The patient will take lovenox injections qd until next visit. The patient was able to repeat back correct dosing regimen. The at home INR company estimated  another two weeks of processing before the patient could receive the device.  Patient advised to contact clinic or seek medical attention if signs/symptoms of bleeding or thromboembolism occur.  Follow-up Follow-up on  04/03/2015 Paul Ryan  30 minutes spent face-to-face with the patient during the encounter. 50% of time spent on education. 50% of time was spent on assessment and plan.  Medication Samples have been provided to the patient.  Drug name: Lovenox injections 120mg /0.46ml  Qty: 6  LOT: [id number]  Exp.Date: 08/2016  The patient has been instructed regarding the correct time, dose, and frequency of taking this medication, including desired effects and most common side effects. The patient was able to correctly explain how to take the medication.   Paul Ryan 11:35 AM 03/29/2015

## 2015-03-29 NOTE — Patient Instructions (Signed)
Patient educated about medication as defined in this encounter and verbalized understanding by repeating back instructions provided.   Food Choices for Gastroesophageal Reflux Disease, Adult When you have gastroesophageal reflux disease (GERD), the foods you eat and your eating habits are very important. Choosing the right foods can help ease the discomfort of GERD. WHAT GENERAL GUIDELINES DO I NEED TO FOLLOW?  Choose fruits, vegetables, whole grains, low-fat dairy products, and low-fat meat, fish, and poultry.  Limit fats such as oils, salad dressings, butter, nuts, and avocado.  Keep a food diary to identify foods that cause symptoms.  Avoid foods that cause reflux. These may be different for different people.  Eat frequent small meals instead of three large meals each day.  Eat your meals slowly, in a relaxed setting.  Limit fried foods.  Cook foods using methods other than frying.  Avoid drinking alcohol.  Avoid drinking large amounts of liquids with your meals.  Avoid bending over or lying down until 2-3 hours after eating. WHAT FOODS ARE NOT RECOMMENDED? The following are some foods and drinks that may worsen your symptoms: Vegetables Tomatoes. Tomato juice. Tomato and spaghetti sauce. Chili peppers. Onion and garlic. Horseradish. Fruits Oranges, grapefruit, and lemon (fruit and juice). Meats High-fat meats, fish, and poultry. This includes hot dogs, ribs, ham, sausage, salami, and bacon. Dairy Whole milk and chocolate milk. Sour cream. Cream. Butter. Ice cream. Cream cheese.  Beverages Coffee and tea, with or without caffeine. Carbonated beverages or energy drinks. Condiments Hot sauce. Barbecue sauce.  Sweets/Desserts Chocolate and cocoa. Donuts. Peppermint and spearmint. Fats and Oils High-fat foods, including [place] fries and potato chips. Other Vinegar. Strong spices, such as black pepper, white pepper, red pepper, cayenne, curry powder, cloves, ginger, and  chili powder. The items listed above may not be a complete list of foods and beverages to avoid. Contact your dietitian for more information.   This information is not intended to replace advice given to you by your health care provider. Make sure you discuss any questions you have with your health care provider.   Document Released: 02/17/2005 Document Revised: 03/10/2014 Document Reviewed: 12/22/2012 Elsevier Interactive Patient Education [employer].

## 2015-03-30 ENCOUNTER — Other Ambulatory Visit (INDEPENDENT_AMBULATORY_CARE_PROVIDER_SITE_OTHER): Payer: Medicare Other

## 2015-03-30 ENCOUNTER — Encounter: Payer: Self-pay | Admitting: Oncology

## 2015-03-30 DIAGNOSIS — E559 Vitamin D deficiency, unspecified: Secondary | ICD-10-CM | POA: Diagnosis not present

## 2015-03-30 DIAGNOSIS — E785 Hyperlipidemia, unspecified: Secondary | ICD-10-CM

## 2015-03-30 DIAGNOSIS — I2699 Other pulmonary embolism without acute cor pulmonale: Secondary | ICD-10-CM | POA: Diagnosis not present

## 2015-03-30 DIAGNOSIS — E039 Hypothyroidism, unspecified: Secondary | ICD-10-CM | POA: Diagnosis not present

## 2015-03-30 DIAGNOSIS — R739 Hyperglycemia, unspecified: Secondary | ICD-10-CM

## 2015-03-30 LAB — COMPREHENSIVE METABOLIC PANEL
ALK PHOS: 41 U/L (ref 39–117)
ALT: 65 U/L — AB (ref 0–53)
AST: 31 U/L (ref 0–37)
Albumin: 3.8 g/dL (ref 3.5–5.2)
BILIRUBIN TOTAL: 0.5 mg/dL (ref 0.2–1.2)
BUN: 16 mg/dL (ref 6–23)
CO2: 30 meq/L (ref 19–32)
Calcium: 9.1 mg/dL (ref 8.4–10.5)
Chloride: 105 mEq/L (ref 96–112)
Creatinine, Ser: 0.98 mg/dL (ref 0.40–1.50)
GFR: 80.23 mL/min (ref >60.00)
GLUCOSE: 98 mg/dL (ref 70–99)
POTASSIUM: 4.1 meq/L (ref 3.5–5.1)
SODIUM: 140 meq/L (ref 135–145)
TOTAL PROTEIN: 7.1 g/dL (ref 6.0–8.3)

## 2015-03-30 LAB — LIPID PANEL
CHOL/HDL RATIO: 6
Cholesterol: 223 mg/dL — ABNORMAL HIGH (ref 0–200)
HDL: 39.8 mg/dL (ref >39.00)
LDL Cholesterol: 157 mg/dL — ABNORMAL HIGH (ref 0–99)
NONHDL: 183.66
Triglycerides: 134 mg/dL (ref 0.0–149.0)
VLDL: 26.8 mg/dL (ref 0.0–40.0)

## 2015-03-30 LAB — VITAMIN D 25 HYDROXY (VIT D DEFICIENCY, FRACTURES): VITD: 56.11 ng/mL (ref 30.00–100.00)

## 2015-03-30 LAB — HEMOGLOBIN A1C: HEMOGLOBIN A1C: 6.4 % (ref 4.6–6.5)

## 2015-03-30 LAB — CBC
HCT: 45.3 % (ref 39.0–52.0)
Hemoglobin: 14.8 g/dL (ref 13.0–17.0)
MCHC: 32.7 g/dL (ref 30.0–36.0)
MCV: 91.2 fl (ref 78.0–100.0)
Platelets: 291 10*3/uL (ref 150.0–400.0)
RBC: 4.97 Mil/uL (ref 4.22–5.81)
RDW: 13.8 % (ref 11.5–15.5)
WBC: 7.9 10*3/uL (ref 4.0–10.5)

## 2015-03-30 LAB — TSH: TSH: 1.26 u[IU]/mL (ref 0.35–4.50)

## 2015-03-31 LAB — HEPATITIS C ANTIBODY: HCV Ab: NEGATIVE

## 2015-04-01 ENCOUNTER — Encounter: Payer: Self-pay | Admitting: Family Medicine

## 2015-04-01 DIAGNOSIS — R7612 Nonspecific reaction to cell mediated immunity measurement of gamma interferon antigen response without active tuberculosis: Secondary | ICD-10-CM | POA: Insufficient documentation

## 2015-04-01 HISTORY — DX: Nonspecific reaction to cell mediated immunity measurement of gamma interferon antigen response without active tuberculosis: R76.12

## 2015-04-01 NOTE — Assessment & Plan Note (Signed)
He notes pain is notably better with prednisone taper

## 2015-04-01 NOTE — Assessment & Plan Note (Signed)
Ordered by rheumatology prior to considering starting biologic agents for PMR. Was positive so patient referred to health department and started on Rifapentine and Isoniazid at 900 mg each he has undergone 3 doses and each time his side effects have become progressively worse. He endorses nausea, vomiting, lightheadedness, headache, disequilibrium, joint pains or myalgias. He is noting worsening shortness of breath and now also endorses fevers and vomiting. He is uninterested in further doses. Repeat QuantiFeron is negative. After reviewing Up to Date a PPD is placed despite his being exposed to BCG in childhood. He is willing to consider a different treatment in future but not the same one. Given Zofran and Promethazine to use prn

## 2015-04-01 NOTE — Assessment & Plan Note (Signed)
Tolerating statin, encouraged heart healthy diet, avoid trans fats, minimize simple carbs and saturated fats. Increase exercise as tolerated 

## 2015-04-01 NOTE — Assessment & Plan Note (Signed)
Tolerating Warfarin

## 2015-04-01 NOTE — Assessment & Plan Note (Signed)
hgba1c acceptable, minimize simple carbs. Increase exercise as tolerated. Continue current meds 

## 2015-04-01 NOTE — Assessment & Plan Note (Signed)
On Armour Thyroid, continue to monitor  

## 2015-04-01 NOTE — Assessment & Plan Note (Signed)
Worsening over past couple of days. Repeat CT is ordered and does not show any worsening PE or new pulmonary concerns

## 2015-04-03 ENCOUNTER — Ambulatory Visit (INDEPENDENT_AMBULATORY_CARE_PROVIDER_SITE_OTHER): Payer: Medicare Other | Admitting: Pharmacist

## 2015-04-03 DIAGNOSIS — Z86718 Personal history of other venous thrombosis and embolism: Secondary | ICD-10-CM | POA: Diagnosis not present

## 2015-04-03 DIAGNOSIS — Z86711 Personal history of pulmonary embolism: Secondary | ICD-10-CM | POA: Diagnosis not present

## 2015-04-03 DIAGNOSIS — D6859 Other primary thrombophilia: Secondary | ICD-10-CM

## 2015-04-03 DIAGNOSIS — D688 Other specified coagulation defects: Secondary | ICD-10-CM | POA: Diagnosis not present

## 2015-04-03 DIAGNOSIS — Z7901 Long term (current) use of anticoagulants: Secondary | ICD-10-CM | POA: Diagnosis not present

## 2015-04-03 DIAGNOSIS — D689 Coagulation defect, unspecified: Secondary | ICD-10-CM

## 2015-04-03 LAB — POCT INR: INR: 4.4

## 2015-04-03 NOTE — Patient Instructions (Signed)
Patient educated about medication as defined in this encounter and verbalized understanding by repeating back instructions provided.   

## 2015-04-03 NOTE — Progress Notes (Signed)
Reviewed This is good news - he is finally therapeutic again Thx DrG

## 2015-04-03 NOTE — Progress Notes (Signed)
Anticoagulation Management Paul Ryan is a 70 y.o. male who reports to the clinic for monitoring of warfarin treatment.    Indication: DVT and PEhistory Duration: indefinite  Anticoagulation Clinic Visit History: Patient does not report signs/symptoms of bleeding or thromboembolism   Anticoagulation Episode Summary    Current INR goal 2.0-3.0  Next INR check 04/09/2015  INR from last check 4.4! 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]        Comments DR [id number]       ASSESSMENT Recent Results: Lab Results  Component Value Date   INR 4.4 04/03/2015   INR 1.7 03/29/2015   INR 2.0 03/26/2015   PROTIME 15.6* 05/17/2013   INR today: Supratherapeutic  Anticoagulation Dosing: INR as of 04/03/2015 and Previous Dosing Information    INR Dt INR Goal Wkly Tot [name]   04/03/2015 4.4 2.0-3.0 42.5 mg 5 mg 7.5 mg 5 mg 5 mg 7.5 mg 5 mg 7.5 mg    Anticoagulation Dose Instructions as of 04/03/2015      Total [name]   New Dose 27.5 mg 5 mg 5 mg 0 mg 2.5 mg 5 mg 5 mg 5 mg     (5 mg x 1)  (5 mg x 1)  -  (5 mg x 0.5)  (5 mg x 1)  (5 mg x 1)  (5 mg x 1)                           PLAN Weekly dose was decreased as noted above. Patient was on rifapentine for tuberculosis until it was stopped around 03/19/15 due to side effects (plan is to switch patient to rifampin). Due to the potential for interaction with rifapentine and upcoming initiation of rifampin, we are working to re-establish a therapeutic regimen with patient. The duration of therapy with rifampin is expected to be 4 months and therefore will require close monitoring during this time (rifampin can suppress effects  of warfarin by approximately 80%). Of note, working to obtain a home [place] XS monitor for patient in preparation for upcoming travel to [place] in March 2017. Will potentially attempt e-visits with patient while out of the country.  Patient Instructions  Patient educated about medication as defined in this encounter and verbalized understanding by repeating back instructions provided.   Patient advised to contact clinic or seek medical attention if signs/symptoms of bleeding or thromboembolism occur.  Follow-up Return in about 6 days (around 04/09/2015) for For follow up INR 04/09/2015 at [facility]  30 minutes spent face-to-face with the patient during the encounter. 75% of time spent on education. 25% of time was spent on assessment and plan.

## 2015-04-06 ENCOUNTER — Encounter: Payer: Self-pay | Admitting: Family Medicine

## 2015-04-06 ENCOUNTER — Ambulatory Visit (INDEPENDENT_AMBULATORY_CARE_PROVIDER_SITE_OTHER): Payer: Medicare Other | Admitting: Family Medicine

## 2015-04-06 VITALS — BP 108/62 | HR 59 | Temp 97.9°F | Ht 71.0 in | Wt 167.5 lb

## 2015-04-06 DIAGNOSIS — E785 Hyperlipidemia, unspecified: Secondary | ICD-10-CM

## 2015-04-06 DIAGNOSIS — R110 Nausea: Secondary | ICD-10-CM

## 2015-04-06 DIAGNOSIS — E039 Hypothyroidism, unspecified: Secondary | ICD-10-CM | POA: Diagnosis not present

## 2015-04-06 DIAGNOSIS — R700 Elevated erythrocyte sedimentation rate: Secondary | ICD-10-CM | POA: Diagnosis not present

## 2015-04-06 DIAGNOSIS — R739 Hyperglycemia, unspecified: Secondary | ICD-10-CM

## 2015-04-06 DIAGNOSIS — R7612 Nonspecific reaction to cell mediated immunity measurement of gamma interferon antigen response without active tuberculosis: Secondary | ICD-10-CM

## 2015-04-06 DIAGNOSIS — M353 Polymyalgia rheumatica: Secondary | ICD-10-CM

## 2015-04-06 DIAGNOSIS — E559 Vitamin D deficiency, unspecified: Secondary | ICD-10-CM

## 2015-04-06 DIAGNOSIS — Z7901 Long term (current) use of anticoagulants: Secondary | ICD-10-CM

## 2015-04-06 NOTE — Progress Notes (Signed)
Pre visit review using our clinic review tool, if applicable. No additional management support is needed unless otherwise documented below in the visit note. 

## 2015-04-06 NOTE — Progress Notes (Signed)
Subjective:    Patient ID: Paul Ryan, male    DOB: 18-Apr-1945, 70 y.o.   MRN: [id number]  Chief Complaint  Patient presents with  . Follow-up    HPI Patient is in today for follow-up. He feels well. His symptoms have improved greatly since stopping his TB treatment. He has agreed to a course of right hand pain to see if he tolerates that better. His polymyalgia continues to improve. No new or acute complaints. Denies CP/palp/SOB/HA/congestion/fevers/GI or GU c/o. Taking meds as prescribed  Past Medical History  Diagnosis Date  . DVT (deep venous thrombosis) ([name])     takes coumadin  . Shingles 1982    right abdominal wall  . Allergy     seasonal  . Hyperlipidemia   . Left hip pain 12/17/2010  . Preventative health care 12/17/2010  . Coagulopathy ([name]) 05/16/2011  . 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 Hypothyroid 06/10/2011  . Vitamin D deficiency 10/14/2011  . Dysuria 02/27/2013  . Chronic anticoagulation 06/14/2013  . Polymyalgia ([name]) 03/21/2014  . Collagen vascular disease ([name])   . Thyroid nodule 11/29/2014  . Hyperglycemia 12/26/2014  . 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    Past Surgical History  Procedure Laterality Date  . 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 Cancer Mother     unknown- everywhere  . Cancer Sister     breast    Social History   Social History  . Marital Status: Married    Spouse Name: N/A  . Number of Children: N/A  . Years of Education: N/A   Occupational History  . Not on file.   Social History Main Topics  . Smoking status: Former Smoker -- 1.00 packs/day for 20 years    Types: Cigarettes    Quit date: 03/03/1980  . Smokeless tobacco: Never Used  . Alcohol Use: No  . Drug Use: No  . Sexual Activity: Not on file   Other Topics Concern  . Not on file   Social History Narrative    Outpatient Prescriptions Prior to Visit    Medication Sig Dispense Refill  . albuterol (PROVENTIL HFA;VENTOLIN HFA) 108 (90 BASE) MCG/ACT inhaler Inhale 2 puffs into the lungs every 4 (four) hours as needed for wheezing or shortness of breath. 1 Inhaler 3  . b complex vitamins tablet Take 1 tablet by mouth daily.      . Cholecalciferol 4000 UNITS TABS Take 1 tablet by mouth daily.    . Coenzyme Q10 (COQ10 PO) Take 2 tablets by mouth daily.    [place] Oil 300 MG CAPS 1 cap daily    . montelukast (SINGULAIR) 10 MG tablet Take 1 tablet (10 mg total) by mouth at bedtime. 90 tablet 3  . NIACIN PO Take 1 tablet by mouth 3 (three) times daily.     . ondansetron (ZOFRAN) 4 MG tablet Take 1 tablet (4 mg total) by mouth every 8 (eight) hours as needed for nausea or vomiting. 40 tablet 1  . predniSONE (DELTASONE) 10 MG tablet Take 1 tablet (10 mg total) by mouth daily with breakfast. 30 tablet 0  . ranitidine (ZANTAC) 300 MG tablet Take 1 tablet (300 mg total) by mouth at bedtime. 30 tablet 2  . rosuvastatin (CRESTOR) 20 MG tablet Take 1 tablet (20 mg total) by mouth at bedtime. 90 tablet 1  . tacrolimus (PROTOPIC) 0.1 %  ointment Apply topically 2 (two) times daily. 100 g 1  . thyroid (ARMOUR THYROID) 60 MG tablet Take 1 tablet (60 mg total) by mouth every other day. Alternates with 57m every other day. 45 tablet 6  . thyroid (ARMOUR THYROID) 90 MG tablet Alternate this with Armour 60 mg 1 tab qod 45 tablet 6  . warfarin (COUMADIN) 5 MG tablet Take 5 mg daily unless otherwise directed by coumadin clinic 90 tablet 1  . Bromfenac Sodium 0.09 % SOLN INSTILL 1 DROP INTO OPERATIVE EYE 2 TIMES DAILY STARTING THE DAY OF SURGERY UNTIL BOTTLE IS EMPTY  2  . DUREZOL 0.05 % EMUL INSTILL 1 DROP TO OPERATED EYE 3 TIMES DAILY STARTING THE DAY OF SURGERY. TAPER AS DIRECTED.  2  . promethazine (PHENERGAN) 25 MG tablet Take 1 tablet (25 mg total) by mouth every 8 (eight) hours as needed for nausea or vomiting. 40 tablet 1  . scopolamine (TRANSDERM-SCOP, 1.5 MG,) 1  MG/3DAYS Place 1 patch (1.5 mg total) onto the skin every 3 (three) days. 2 patch 0  . trimethoprim-polymyxin b (POLYTRIM) ophthalmic solution INSTILL 1 DROP TO OPERATED EYE 3 TIMES DAILY STARTING THE DAY OF SURGERY. TAPER AS DIRECTED.  2  . valACYclovir (VALTREX) 1000 MG tablet Take 1 tablet (1,000 mg total) by mouth 3 (three) times daily. 30 tablet 0   No facility-administered medications prior to visit.    No Known Allergies  Review of Systems  Constitutional: Negative for fever and malaise/fatigue.  HENT: Negative for congestion.   Eyes: Negative for discharge.  Respiratory: Negative for shortness of breath.   Cardiovascular: Negative for chest pain, palpitations and leg swelling.  Gastrointestinal: Negative for nausea and abdominal pain.  Genitourinary: Negative for dysuria.  Musculoskeletal: Negative for falls.  Skin: Negative for rash.  Neurological: Negative for loss of consciousness and headaches.  Endo/Heme/Allergies: Negative for environmental allergies.  Psychiatric/Behavioral: Negative for depression. The patient is not nervous/anxious.        Objective:    Physical Exam  Constitutional: He is oriented to person, place, and time. He appears well-developed and well-nourished. No distress.  HENT:  Head: Normocephalic and atraumatic.  Nose: Nose normal.  Eyes: Right eye exhibits no discharge. Left eye exhibits no discharge.  Neck: Normal range of motion. Neck supple.  Cardiovascular: Normal rate and regular rhythm.   No murmur heard. Pulmonary/Chest: Effort normal and breath sounds normal.  Abdominal: Soft. Bowel sounds are normal. There is no tenderness.  Musculoskeletal: He exhibits no edema.  Neurological: He is alert and oriented to person, place, and time.  Skin: Skin is warm and dry.  Psychiatric: He has a normal mood and affect.  Nursing note and vitals reviewed.   BP 108/62 mmHg  Pulse 59  Temp(Src) 97.9 F (36.6 C) (Oral)  Ht _0  (1.803 m)  Wt  167 lb 8 oz (75.978 kg)  BMI 23.37 kg/m2  SpO2 93% Wt Readings from Last 3 Encounters:  04/06/15 167 lb 8 oz (75.978 kg)  03/23/15 165 lb 8 oz (75.07 kg)  03/15/15 173 lb (78.472 kg)     Lab Results  Component Value Date   WBC 7.9 03/30/2015   HGB 14.8 03/30/2015   HCT 45.3 03/30/2015   PLT 291.0 03/30/2015   GLUCOSE 98 03/30/2015   CHOL 223* 03/30/2015   TRIG 134.0 03/30/2015   HDL 39.80 03/30/2015   LDLDIRECT 158.0 03/11/2013   LDLCALC 157* 03/30/2015   ALT 65* 03/30/2015   AST 31 03/30/2015  NA 140 03/30/2015   K 4.1 03/30/2015   CL 105 03/30/2015   CREATININE 0.98 03/30/2015   BUN 16 03/30/2015   CO2 30 03/30/2015   TSH 1.26 03/30/2015   PSA 1.63 10/14/2011   INR 3.7 04/13/2015   HGBA1C 6.4 03/30/2015    Lab Results  Component Value Date   TSH 1.26 03/30/2015   Lab Results  Component Value Date   WBC 7.9 03/30/2015   HGB 14.8 03/30/2015   HCT 45.3 03/30/2015   MCV 91.2 03/30/2015   PLT 291.0 03/30/2015   Lab Results  Component Value Date   NA 140 03/30/2015   K 4.1 03/30/2015   CHLORIDE 109 11/16/2012   CO2 30 03/30/2015   GLUCOSE 98 03/30/2015   BUN 16 03/30/2015   CREATININE 0.98 03/30/2015   BILITOT 0.5 03/30/2015   ALKPHOS 41 03/30/2015   AST 31 03/30/2015   ALT 65* 03/30/2015   PROT 7.1 03/30/2015   ALBUMIN 3.8 03/30/2015   CALCIUM 9.1 03/30/2015   ANIONGAP 8 09/11/2014   GFR 80.23 03/30/2015   Lab Results  Component Value Date   CHOL 223* 03/30/2015   Lab Results  Component Value Date   HDL 39.80 03/30/2015   Lab Results  Component Value Date   LDLCALC 157* 03/30/2015   Lab Results  Component Value Date   TRIG 134.0 03/30/2015   Lab Results  Component Value Date   CHOLHDL 6 03/30/2015   Lab Results  Component Value Date   HGBA1C 6.4 03/30/2015       Assessment & Plan:   Problem List Items Addressed This Visit    Chronic anticoagulation    Tolerating Coumadin      Hyperglycemia   Relevant Orders    Hemoglobin A1C   Hyperlipidemia   Relevant Orders   Lipid panel   Hypothyroid    Requesting switch from Nature Thyroid to Armour Thyroid. Equivalent meds so will allow at same dosing. Continue to monitor      Relevant Orders   TSH   Polymyalgia rheumatica ([name]) - Primary    Is titrating down the Prednisone 3 mg daily will drop to 2 mg daily next week for one month      Positive QuantiFERON-TB Gold test    Initial test positive but repeat test is negative. Did not tolerate treatment at health department but agrees to Rifampin as an alternative      Vitamin D deficiency     Vitamin D 2000 IU daily. Level has normalized      Relevant Orders   VITAMIN D 25 Hydroxy (Vit-D Deficiency, Fractures)    Other Visit Diagnoses    ESR raised        Relevant Orders    Sedimentation rate    Nausea without vomiting        Relevant Orders    Comprehensive metabolic panel    CBC       I have discontinued Mr. Hires scopolamine, trimethoprim-polymyxin b, DUREZOL, Bromfenac Sodium, valACYclovir, and promethazine. I am also having him maintain his b complex vitamins, [place] Oil, tacrolimus, NIACIN PO, Cholecalciferol, Coenzyme Q10 (COQ10 PO), albuterol, predniSONE, thyroid, thyroid, montelukast, rosuvastatin, ondansetron, ranitidine, and warfarin.  No orders of the defined types were placed in this encounter.     Willette Alma, MD

## 2015-04-06 NOTE — Assessment & Plan Note (Signed)
Is titrating down the Prednisone 3 mg daily will drop to 2 mg daily next week for one month

## 2015-04-06 NOTE — Patient Instructions (Signed)
3-5 drops of hydrogen peroxide in ears once weekly as needed. Cholesterol  Cholesterol is a white, waxy, fat-like substance needed by your body in small amounts. The liver makes all the cholesterol you need. Cholesterol is carried from the liver by the blood through the blood vessels. Deposits of cholesterol (plaque) may build up on blood vessel walls. These make the arteries narrower and stiffer. Cholesterol plaques increase the risk for heart attack and stroke.  You cannot feel your cholesterol level even if it is very high. The only way to know it is high is with a blood test. Once you know your cholesterol levels, you should keep a record of the test results. Work with your health care provider to keep your levels in the desired range.  WHAT DO THE RESULTS MEAN?  Total cholesterol is a rough measure of all the cholesterol in your blood.   LDL is the so-called bad cholesterol. This is the type that deposits cholesterol in the walls of the arteries. You want this level to be low.   HDL is the good cholesterol because it cleans the arteries and carries the LDL away. You want this level to be high.  Triglycerides are fat that the body can either burn for energy or store. High levels are closely linked to heart disease.  WHAT ARE THE DESIRED LEVELS OF CHOLESTEROL?  Total cholesterol below 200.   LDL below 100 for people at risk, below 70 for those at very high risk.   HDL above 50 is good, above 60 is best.   Triglycerides below 150.  HOW CAN I LOWER MY CHOLESTEROL?  Diet. Follow your diet programs as directed by your health care provider.   Choose fish or white meat chicken and [place], roasted or baked. Limit fatty cuts of red meat, fried foods, and processed meats, such as sausage and lunch meats.   Eat lots of fresh fruits and vegetables.  Choose whole grains, beans, pasta, potatoes, and cereals.   Use only small amounts of olive, corn, or canola oils.   Avoid butter,  mayonnaise, shortening, or palm kernel oils.  Avoid foods with trans fats.   Drink skim or nonfat milk and eat low-fat or nonfat yogurt and cheeses. Avoid whole milk, cream, ice cream, egg yolks, and full-fat cheeses.   Healthy desserts include angel food cake, ginger snaps, animal crackers, hard candy, popsicles, and low-fat or nonfat frozen yogurt. Avoid pastries, cakes, pies, and cookies.   Exercise. Follow your exercise programs as directed by your health care provider.   A regular program helps decrease LDL and raise HDL.   A regular program helps with weight control.   Do things that increase your activity level like gardening, walking, or taking the stairs. Ask your health care provider about how you can be more active in your daily life.   Medicine. Take medicine only as directed by your health care provider.   Medicine may be prescribed by your health care provider to help lower cholesterol and decrease the risk for heart disease.   If you have several risk factors, you may need medicine even if your levels are normal.   This information is not intended to replace advice given to you by your health care provider. Make sure you discuss any questions you have with your health care provider.   Document Released: 11/12/2000 Document Revised: 03/10/2014 Document Reviewed: 12/01/2012 Elsevier Interactive Patient Education [employer].

## 2015-04-09 ENCOUNTER — Ambulatory Visit (INDEPENDENT_AMBULATORY_CARE_PROVIDER_SITE_OTHER): Payer: Medicare Other | Admitting: Pharmacist

## 2015-04-09 DIAGNOSIS — Z7901 Long term (current) use of anticoagulants: Secondary | ICD-10-CM

## 2015-04-09 DIAGNOSIS — I82409 Acute embolism and thrombosis of unspecified deep veins of unspecified lower extremity: Secondary | ICD-10-CM

## 2015-04-09 DIAGNOSIS — D6859 Other primary thrombophilia: Secondary | ICD-10-CM

## 2015-04-09 DIAGNOSIS — D688 Other specified coagulation defects: Secondary | ICD-10-CM

## 2015-04-09 DIAGNOSIS — D689 Coagulation defect, unspecified: Secondary | ICD-10-CM

## 2015-04-09 LAB — POCT INR: INR: 3.2

## 2015-04-09 NOTE — Progress Notes (Signed)
Patient was seen in clinic with [name] Makhlouf, PharmD candidate. I agree with the assessment and plan of care documented by [name]. 

## 2015-04-09 NOTE — Progress Notes (Signed)
Patient ID: Paul Ryan, male   DOB: 20-Feb-1946, 70 y.o.   MRN: [id number] Anticoagulation Management Paul Ryan is a 70 y.o. male who reports to the clinic for monitoring of warfarin treatment.    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  Anticoagulation Clinic Visit History: Patient does not report signs/symptoms of bleeding or thromboembolism  Other recent changes: will begin rifampin tx for four months on 04/10/15  Anticoagulation Episode Summary    Current INR goal 2.0-3.0  Next INR check 04/13/2015  INR from last check 4.4! 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]        Comments [id number]       ASSESSMENT Recent Results: Recent results are below, the most recent result is correlated with a dose of 27.5 mg per week: Lab Results  Component Value Date   INR 3.2 04/09/2015   INR 4.4 04/03/2015   INR 1.7 03/29/2015   INR 2.0 03/26/2015   PROTIME 15.6* 05/17/2013    Anticoagulation Dosing: INR as of 04/09/2015 and Previous Dosing Information    INR Dt INR Goal [name] [name]   04/09/2015 3.2 2.0-3.0 27.5 mg 5 mg 5 mg 0 mg 2.5 mg 5 mg 5 mg 5 mg    Anticoagulation Dose Instructions as of 04/03/2015      Total [name]   New Dose 35 mg 5 mg 5 mg 5 mg 5 mg 5 mg 5 mg 5 mg     (5 mg x 1)  (5 mg x 1)  (5 mg x 1)  (5 mg x 1)  (5 mg x 1)  (5 mg x 1)  (5 mg x 1)                           INR today: Supratherapeutic  PLAN Re-establishing weekly dose. Patient will begin therapy with Rifampin 600mg  qd for four months. Literature suggests concurrent administration of rifampin (600 mg/day) with therapeutic doses of warfarin for 21 days resulted  in an 85% decrease in warfarin plasma concentrations and an increase in prothrombin activity from 27% to 85% of normal. Anticipating a significant decrease in INR with therapy change, and did not decrease weekly dose from today's supratherapeutic result.  Patient advised to contact clinic or seek medical attention if signs/symptoms of bleeding or thromboembolism occur.  Follow-up Follow-up Friday 04/13/15 10am   [name]  30 minutes spent face-to-face with the patient during the encounter. 50% of time spent on education. 50% of time was spent on assessment and plan.

## 2015-04-09 NOTE — Progress Notes (Signed)
Reviewed Thanks DrG 

## 2015-04-09 NOTE — Patient Instructions (Signed)
Patient educated about medication as defined in this encounter and verbalized understanding by repeating back instructions provided.   

## 2015-04-13 ENCOUNTER — Ambulatory Visit (INDEPENDENT_AMBULATORY_CARE_PROVIDER_SITE_OTHER): Payer: Medicare Other | Admitting: Pharmacist

## 2015-04-13 ENCOUNTER — Encounter: Payer: Self-pay | Admitting: Family Medicine

## 2015-04-13 DIAGNOSIS — D689 Coagulation defect, unspecified: Secondary | ICD-10-CM

## 2015-04-13 DIAGNOSIS — D688 Other specified coagulation defects: Secondary | ICD-10-CM | POA: Diagnosis not present

## 2015-04-13 DIAGNOSIS — D6859 Other primary thrombophilia: Secondary | ICD-10-CM

## 2015-04-13 DIAGNOSIS — Z7901 Long term (current) use of anticoagulants: Secondary | ICD-10-CM | POA: Diagnosis not present

## 2015-04-13 DIAGNOSIS — I82409 Acute embolism and thrombosis of unspecified deep veins of unspecified lower extremity: Secondary | ICD-10-CM | POA: Diagnosis not present

## 2015-04-13 DIAGNOSIS — I2699 Other pulmonary embolism without acute cor pulmonale: Secondary | ICD-10-CM

## 2015-04-13 LAB — POCT INR: INR: 3.7

## 2015-04-13 NOTE — Patient Instructions (Signed)
Patient educated about medication as defined in this encounter and verbalized understanding by repeating back instructions provided.   

## 2015-04-13 NOTE — Progress Notes (Signed)
Anticoagulation Management Paul Ryan is a 70 y.o. male who reports to the clinic for monitoring of warfarin treatment.    Indication: DVT and PEhistory Duration: indefinite  Anticoagulation Clinic Visit History: Patient does not report signs/symptoms of bleeding or thromboembolism. 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]        Comments [id number]       ASSESSMENT Recent Results: The most recent result is correlated with 35 mg per week: Lab Results  Component Value Date   INR 3.7 04/13/2015   INR 3.2 04/09/2015   INR 4.4 04/03/2015   PROTIME 15.6* 05/17/2013   Anticoagulation Dosing: INR as of 04/13/2015 and Previous Dosing Information    INR Dt INR Goal Wkly Tot [name]     2.0-3.0 35 mg 5 mg 5 mg 5 mg 5 mg 5 mg 5 mg 5 mg    Anticoagulation Dose Instructions as of 04/13/2015      Total [name]   New Dose 30 mg 5 mg 5 mg 5 mg 5 mg 5 mg 2.5 mg 2.5 mg     (5 mg x 1)  (5 mg x 1)  (5 mg x 1)  (5 mg x 1)  (5 mg x 1)  (5 mg x 0.5)  (5 mg x 0.5)                           INR today: Supratherapeutic  PLAN Weekly dose was decreased by 14% to 30 mg per week  Patient Instructions  Patient educated about medication as defined in this encounter and verbalized understanding by repeating back instructions provided.    Patient advised to contact clinic or seek medical attention if signs/symptoms of bleeding or thromboembolism occur.  Follow-up 1 week, will need to monitor  closely due to rifampin interaction and to prepare for patient's potential upcoming international travel.  Paul Ryan  15 minutes spent face-to-face with the patient during the encounter. 50% of time spent on education. 50% of time was spent on assessment and plan.

## 2015-04-13 NOTE — Progress Notes (Signed)
Reviewed thx DrG 

## 2015-04-15 NOTE — Assessment & Plan Note (Signed)
Tolerating Coumadin 

## 2015-04-15 NOTE — Assessment & Plan Note (Signed)
Initial test positive but repeat test is negative. Did not tolerate treatment at [facility] but agrees to Rifampin as an alternative

## 2015-04-15 NOTE — Assessment & Plan Note (Signed)
Requesting switch from [facility] Thyroid to [facility] Thyroid. Equivalent meds so will allow at same dosing. Continue to monitor

## 2015-04-15 NOTE — Assessment & Plan Note (Addendum)
Vitamin D 2000 IU daily. Level has normalized

## 2015-04-16 ENCOUNTER — Telehealth: Payer: Self-pay | Admitting: Family Medicine

## 2015-04-16 ENCOUNTER — Other Ambulatory Visit: Payer: Self-pay | Admitting: Family Medicine

## 2015-04-16 DIAGNOSIS — E039 Hypothyroidism, unspecified: Secondary | ICD-10-CM

## 2015-04-16 MED ORDER — THYROID 60 MG PO TABS: 60.0000 mg | ORAL_TABLET | ORAL | Status: DC

## 2015-04-16 MED ORDER — THYROID 90 MG PO TABS: ORAL_TABLET | ORAL | Status: DC

## 2015-04-16 NOTE — Telephone Encounter (Signed)
-----   Message from [name], MD sent at 04/15/2015  1:25 PM EST ----- Please send 2 clarifying prescriptions to his pharmacy for him. They are telling him he needs a new prescription to change from armour thyroid to nature thyroid. They are essentially the same thing and when I enter nature thyroid in epic armour thyroid comes up. Please send is thyroid med 60 mg and 90 mg rx's with same sig, same number 5 rf each just put in next that we want patient to get the [facility] thyroid. His wife Sula Soda is requesting the same change but I have asked them to send a mychart message through her account to document this. Thanks  [employer]

## 2015-04-16 NOTE — Telephone Encounter (Signed)
Refilled thyroid medication as instructed.

## 2015-04-17 ENCOUNTER — Ambulatory Visit (INDEPENDENT_AMBULATORY_CARE_PROVIDER_SITE_OTHER): Payer: Medicare Other | Admitting: Pharmacist

## 2015-04-17 DIAGNOSIS — D688 Other specified coagulation defects: Secondary | ICD-10-CM | POA: Diagnosis not present

## 2015-04-17 DIAGNOSIS — D6859 Other primary thrombophilia: Secondary | ICD-10-CM

## 2015-04-17 DIAGNOSIS — Z7901 Long term (current) use of anticoagulants: Secondary | ICD-10-CM | POA: Diagnosis not present

## 2015-04-17 LAB — POCT INR: INR: 1.7

## 2015-04-18 MED ORDER — ENOXAPARIN SODIUM 120 MG/0.8ML ~~LOC~~ SOLN
120.0000 mg | SUBCUTANEOUS | Status: DC
Start: 2015-04-18 — End: 2015-09-07

## 2015-04-18 NOTE — Progress Notes (Signed)
Anticoagulation Management Paul Ryan is a 70 y.o. male who reports to the clinic for monitoring of warfarin treatment.    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  Anticoagulation Clinic Visit History: Patient does not report signs/symptoms of bleeding or thromboembolism  Anticoagulation Episode Summary    Current INR goal 2.0-3.0  Next INR check 04/20/2015  INR from last check 1.7! 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]        Comments DR [id number]       ASSESSMENT Recent Results: The most recent result is correlated with 32.5 mg per week: Lab Results  Component Value Date   INR 1.7 04/17/2015   INR 3.7 04/13/2015   INR 3.2 04/09/2015   PROTIME 15.6* 05/17/2013   Anticoagulation Dosing: INR as of 04/17/2015 and Previous Dosing Information    INR Dt INR Goal [name] [name]   04/17/2015 1.7 2.0-3.0 32.5 mg 5 mg 7.5 mg 5 mg 5 mg 5 mg 2.5 mg 2.5 mg    Anticoagulation Dose Instructions as of 04/17/2015      Total [name]   New Dose 40 mg 5 mg 7.5 mg 7.5 mg 7.5 mg 7.5 mg 2.5 mg 2.5 mg    INR today: Subtherapeutic  PLAN Weekly dose was increased by 23% to 40 mg per week. Patient has completed 1 week of rifampin therapy with a planned duration of 4 months. Also added enoxaparin 120 mg daily for bridging until therapeutic, although we did discuss with Dr. Beryle Beams and patient taking enoxaparin 120 mg daily monotherapy without warfarin while on rifampin due to warfarin-rifampin drug interaction. We will maintain warfarin + enoxaparin for now pending further discussion and consideration.  Medication Samples have been provided to the  patient.  Drug name: enoxaparin 120 mg Qty: 6 syringes LOT: [id number]  Exp.Date: 08/2016  The patient has been instructed regarding the correct time, dose, and frequency of taking this medication, including desired effects and most common side effects.   [name],[name] 4:44 PM 04/18/2015  Patient Instructions  Patient educated about medication as defined in this encounter and verbalized understanding by repeating back instructions provided.    Patient advised to contact clinic or seek medical attention if signs/symptoms of bleeding or thromboembolism occur.  Follow-up Return in about 3 days (around 04/20/2015).  [name],[name]  30 minutes spent face-to-face with the patient during the encounter. 50% of time spent on education. 50% of time was spent on assessment and plan.

## 2015-04-18 NOTE — Patient Instructions (Signed)
Patient educated about medication as defined in this encounter and verbalized understanding by repeating back instructions provided.   

## 2015-04-18 NOTE — Progress Notes (Signed)
Reviewed and discussed with pharmacist. Thanks DrG

## 2015-04-20 ENCOUNTER — Ambulatory Visit (INDEPENDENT_AMBULATORY_CARE_PROVIDER_SITE_OTHER): Payer: Medicare Other | Admitting: Pharmacist

## 2015-04-20 DIAGNOSIS — Z7901 Long term (current) use of anticoagulants: Secondary | ICD-10-CM | POA: Diagnosis not present

## 2015-04-20 DIAGNOSIS — D688 Other specified coagulation defects: Secondary | ICD-10-CM | POA: Diagnosis not present

## 2015-04-20 DIAGNOSIS — D6859 Other primary thrombophilia: Secondary | ICD-10-CM

## 2015-04-20 DIAGNOSIS — I82409 Acute embolism and thrombosis of unspecified deep veins of unspecified lower extremity: Secondary | ICD-10-CM

## 2015-04-20 DIAGNOSIS — I2699 Other pulmonary embolism without acute cor pulmonale: Secondary | ICD-10-CM

## 2015-04-20 DIAGNOSIS — D689 Coagulation defect, unspecified: Secondary | ICD-10-CM

## 2015-04-20 LAB — POCT INR: INR: 2.2

## 2015-04-20 NOTE — Progress Notes (Signed)
Patient was seen in clinic with Paul Ryan, PharmD candidate. I agree with the assessment and plan of care documented [name].

## 2015-04-20 NOTE — Progress Notes (Signed)
Patient ID: Paul Ryan, male   DOB: 11/08/1945, 70 y.o.   MRN: [id number] Anticoagulation Management Paul Ryan is a 70 y.o. male who reports to the clinic for monitoring of warfarin treatment.    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]        Comments DR [id number]       ASSESSMENT Recent Results: The most recent result is correlated with 35.5 mg per week: Lab Results  Component Value Date   INR 2.2 04/20/2015   INR 1.7 04/17/2015   INR 3.7 04/13/2015   INR 3.2 04/09/2015   PROTIME 15.6* 05/17/2013    Anticoagulation Dosing: INR as of 04/17/2015 and Previous Dosing Information    INR Dt INR Goal [name] [name]   04/20/2015 2.2 2.0-3.0 37.5 mg 5 mg 5 mg 7.5 mg 7.5 mg 7.5 mg 2.5 mg 2.5 mg    Anticoagulation Dose Instructions as of 04/17/2015      Total [name]   New Dose 37.5 mg 5 mg 5 mg 5 mg 5 mg 5 mg 7.5 mg 5 mg     (5 mg x 1)  (5 mg x 1)  (5 mg x 1)  (5 mg x 1)  (5 mg x 1)  (5 mg x 1.5)  (5 mg x 1)                           INR today: Therapeutic  PLAN Weekly dose was unchanged by 0% to 40 mg per week  Patient will check INR on Monday with home meter. Will d/c lovenox for now. Patient has been instructed to let us know his INR on Monday.  Patient advised to contact clinic or seek medical attention if signs/symptoms of bleeding or thromboembolism occur.  Patient  verbalized understanding by repeating back information and was advised to contact me if further medication-related questions arise. Patient was also provided an information handout.  Follow-up 04/23/15 Paul Ryan  30 minutes spent face-to-face with the patient during the encounter. 50% of time spent on education. 50% of time was spent on assessment and plan.

## 2015-04-20 NOTE — Progress Notes (Signed)
Reviewed Thanks DrG 

## 2015-04-20 NOTE — Patient Instructions (Signed)
Patient educated about medication as defined in this encounter and verbalized understanding by repeating back instructions provided.   

## 2015-04-23 ENCOUNTER — Ambulatory Visit (INDEPENDENT_AMBULATORY_CARE_PROVIDER_SITE_OTHER): Payer: Medicare Other | Admitting: Pharmacist

## 2015-04-23 DIAGNOSIS — I82409 Acute embolism and thrombosis of unspecified deep veins of unspecified lower extremity: Secondary | ICD-10-CM

## 2015-04-23 DIAGNOSIS — D688 Other specified coagulation defects: Secondary | ICD-10-CM

## 2015-04-23 DIAGNOSIS — I2699 Other pulmonary embolism without acute cor pulmonale: Secondary | ICD-10-CM

## 2015-04-23 DIAGNOSIS — Z7901 Long term (current) use of anticoagulants: Secondary | ICD-10-CM

## 2015-04-23 DIAGNOSIS — D689 Coagulation defect, unspecified: Secondary | ICD-10-CM

## 2015-04-23 DIAGNOSIS — D6859 Other primary thrombophilia: Secondary | ICD-10-CM

## 2015-04-23 LAB — POCT INR: INR: 1.5

## 2015-04-23 NOTE — Patient Instructions (Signed)
Patient educated about medication as defined in this encounter and verbalized understanding by repeating back instructions provided.   

## 2015-04-23 NOTE — Progress Notes (Signed)
Reviewed & agree with plan Thanks DrG

## 2015-04-23 NOTE — Progress Notes (Addendum)
Anticoagulation Management Paul Ryan is a 70 y.o. male who reports to the clinic for monitoring of warfarin treatment.  Patient has a scheduled follow up 04/27/15 however has received his home INR monitor and resulted with INR of 1.5 today (confirmed by [facility] patient services).  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  Anticoagulation Clinic Visit History: Patient does not report signs/symptoms of bleeding or thromboembolism  Anticoagulation Episode Summary    Current INR goal 2.0-3.0  Next INR check 04/27/2015  INR from last check 1.5! 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5 mg 7.5 mg 7.5 mg 5 mg     (5 mg x 1)  (5 mg x 1.5)  (5 mg x 1.5)  (5 mg x 1.5)  (5 mg x 1.5)  (5 mg x 1.5)  (5 mg x 1)                           INR today: Subtherapeutic  PLAN Weekly dose was increased by 6% to 47.5 mg per week, however anticipating we will need to increase further (~52.5 mg/week) based on follow up INR in 4 days. Notably, patient has started week 3 of rifampin treatment (total duration will be ~ 4  months). Patient was advised to initiate enoxaparin bridge, 120 mg SQ daily, starting today.  Patient Instructions  Patient educated about medication as defined in this encounter and verbalized understanding by repeating back instructions provided.   Patient advised to contact clinic or seek medical attention if signs/symptoms of bleeding or thromboembolism occur.  Patient verbalized understanding by repeating back information and was advised to contact me if further medication-related questions arise. Patient was also provided an information handout.  Follow-up 04/27/15 at 10 am  Dulce Martian J

## 2015-04-24 ENCOUNTER — Other Ambulatory Visit: Payer: Self-pay | Admitting: Family Medicine

## 2015-04-24 NOTE — Telephone Encounter (Signed)
Found a decent conversion table, switch his 90 mg tab to a 97.5 mg tab same sig ane the 60 to 65 mg tab Disp 30 d supply with 5 rf or #90 with 1 rf

## 2015-04-24 NOTE — Telephone Encounter (Signed)
Pharmacist needs to know what strength to fill for the [facility] thyroid sent in as strengths are different than the previous

## 2015-04-25 MED ORDER — THYROID 65 MG PO TABS: 65.0000 mg | ORAL_TABLET | Freq: Every day | ORAL | Status: DC

## 2015-04-25 MED ORDER — THYROID 97.5 MG PO TABS: 1.0000 | ORAL_TABLET | Freq: Every day | ORAL | Status: DC

## 2015-04-25 NOTE — Telephone Encounter (Signed)
Done as instructed. Medication list updated

## 2015-04-27 ENCOUNTER — Ambulatory Visit (INDEPENDENT_AMBULATORY_CARE_PROVIDER_SITE_OTHER): Payer: Medicare Other | Admitting: Pharmacist

## 2015-04-27 DIAGNOSIS — D688 Other specified coagulation defects: Secondary | ICD-10-CM | POA: Diagnosis not present

## 2015-04-27 DIAGNOSIS — Z7901 Long term (current) use of anticoagulants: Secondary | ICD-10-CM | POA: Diagnosis not present

## 2015-04-27 DIAGNOSIS — D689 Coagulation defect, unspecified: Secondary | ICD-10-CM

## 2015-04-27 DIAGNOSIS — D6859 Other primary thrombophilia: Secondary | ICD-10-CM

## 2015-04-27 LAB — POCT INR: INR: 1.8

## 2015-04-27 NOTE — Progress Notes (Signed)
Anticoagulation Management Paul Ryan is a 70 y.o. male who reports to the clinic for monitoring of warfarin treatment.    Indication: history DVT, PE and protein S deficiency Duration: indefinite  Anticoagulation Clinic Visit History: Patient does not report signs/symptoms of bleeding or thromboembolism  Anticoagulation Episode Summary    Current INR goal 2.0-3.0  Next INR check 04/30/2015  INR from last check 1.8! 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]        Comments DR [id number]       ASSESSMENT Recent Results: The most recent result is correlated with 47.5 mg per week: Lab Results  Component Value Date   INR 1.8 04/27/2015   INR 1.5 04/23/2015   INR 2.2 04/20/2015   PROTIME 15.6* 05/17/2013   Anticoagulation Dosing: INR as of 04/27/2015 and Previous Dosing Information    INR Dt INR Goal [name] [name]   04/27/2015 1.8 2.0-3.0 47.5 mg 5 mg 7.5 mg 7.5 mg 7.5 mg 7.5 mg 7.5 mg 5 mg    Anticoagulation Dose Instructions as of 04/27/2015      Total [name]   New Dose 57.5 mg 10 mg 7.5 mg 7.5 mg 7.5 mg 7.5 mg 10 mg 7.5 mg     (5 mg x 2)  (5 mg x 1.5)  (5 mg x 1.5)  (5 mg x 1.5)  (5 mg x 1.5)  (5 mg x 2)  (5 mg x 1.5)                           INR today: Subtherapeutic  PLAN Weekly dose was increased by 21% to 57.5 mg per week.  Patient Instructions  Patient educated about medication as defined in this encounter and verbalized understanding by repeating back instructions provided.   Patient advised to contact clinic or seek medical attention if signs/symptoms of bleeding or thromboembolism occur.  Patient verbalized understanding by repeating back  information and was advised to contact me if further medication-related questions arise. Patient was also provided an information handout.  Follow-up No Follow-up on file.  Takila Kronberg J  30 minutes spent face-to-face with the patient during the encounter. 50% of time spent on education. 50% of time was spent on assessment and plan.

## 2015-04-27 NOTE — Patient Instructions (Signed)
Patient educated about medication as defined in this encounter and verbalized understanding by repeating back instructions provided.   

## 2015-04-30 ENCOUNTER — Ambulatory Visit (INDEPENDENT_AMBULATORY_CARE_PROVIDER_SITE_OTHER): Payer: Medicare Other | Admitting: Pharmacist

## 2015-04-30 DIAGNOSIS — Z7901 Long term (current) use of anticoagulants: Secondary | ICD-10-CM | POA: Diagnosis not present

## 2015-04-30 DIAGNOSIS — D689 Coagulation defect, unspecified: Secondary | ICD-10-CM

## 2015-04-30 DIAGNOSIS — D688 Other specified coagulation defects: Secondary | ICD-10-CM

## 2015-04-30 DIAGNOSIS — I2699 Other pulmonary embolism without acute cor pulmonale: Secondary | ICD-10-CM | POA: Diagnosis not present

## 2015-04-30 DIAGNOSIS — D6859 Other primary thrombophilia: Secondary | ICD-10-CM

## 2015-04-30 LAB — POCT INR: INR: 2.1

## 2015-04-30 NOTE — Progress Notes (Signed)
Patient ID: Paul Ryan, male   DOB: 1945-07-22, 70 y.o.   MRN: [id number] Anticoagulation Management Paul Ryan is a 70 y.o. male who reports to the clinic for monitoring of warfarin treatment.    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]        Comments [id number]       ASSESSMENT Recent Results: The most recent result is correlated with 57.5 mg per week: Lab Results  Component Value Date   INR 2.1 04/30/2015   INR 1.8 04/27/2015   INR 1.5 04/23/2015   INR 2.2 04/20/2015   PROTIME 15.6* 05/17/2013    Anticoagulation Dosing: INR as of 04/30/2015 and Previous Dosing Information    INR Dt INR Goal [name] [name]   04/27/2015 2.1 2.0-3.0 57.5 mg 10 mg 7.5 mg 7.5 mg 7.5 mg 7.5 mg 10 mg 7.5 mg    Anticoagulation Dose Instructions as of 04/27/2015      Total [name]   New Dose 60 mg 7.5 mg 10 mg 7.5 mg 10 mg 7.5 mg 10 mg 7.5 mg     (5 mg x 1.5)  (5 mg x 2)  (5 mg x 1.5)  (5 mg x 2)  (5 mg x 1.5)  (5 mg x 2)  (5 mg x 1.5)                           INR today: Therapeutic  PLAN Weekly dose was increased by 4% to 60 mg per week  Patient assessed INR at home with home meter and reported result to clinic. The new dosing regimen was explained to the patient and the patient understands the new weekly dose. Patient advised to contact clinic or seek medical attention if  signs/symptoms of bleeding or thromboembolism occur.  Patient verbalized understanding by repeating back information and was advised to contact me if further medication-related questions arise. Patient was also provided an information handout.  Follow-up Friday 05/04/2015  Paul Ryan

## 2015-05-01 NOTE — Progress Notes (Addendum)
Patient was seen in clinic with Paul Ryan, PharmD candidate. I agree with the assessment and plan of care documented [name]. Patient advised to stop enoxaparin injections for now.

## 2015-05-01 NOTE — Patient Instructions (Signed)
Patient educated about medication as defined in this encounter and verbalized understanding by repeating back instructions provided.   

## 2015-05-01 NOTE — Progress Notes (Signed)
Reviewed Thanks DrG 

## 2015-05-03 ENCOUNTER — Other Ambulatory Visit: Payer: Self-pay | Admitting: Family Medicine

## 2015-05-03 ENCOUNTER — Encounter: Payer: Self-pay | Admitting: Family Medicine

## 2015-05-03 MED ORDER — ALBUTEROL SULFATE HFA 108 (90 BASE) MCG/ACT IN AERS: 2.0000 | INHALATION_SPRAY | RESPIRATORY_TRACT | Status: DC | PRN

## 2015-05-04 ENCOUNTER — Ambulatory Visit (INDEPENDENT_AMBULATORY_CARE_PROVIDER_SITE_OTHER): Payer: Medicare Other | Admitting: Pharmacist

## 2015-05-04 DIAGNOSIS — Z7901 Long term (current) use of anticoagulants: Secondary | ICD-10-CM | POA: Diagnosis not present

## 2015-05-04 DIAGNOSIS — D688 Other specified coagulation defects: Secondary | ICD-10-CM | POA: Diagnosis not present

## 2015-05-04 DIAGNOSIS — I2782 Chronic pulmonary embolism: Secondary | ICD-10-CM | POA: Diagnosis not present

## 2015-05-04 DIAGNOSIS — D6859 Other primary thrombophilia: Secondary | ICD-10-CM

## 2015-05-04 DIAGNOSIS — D689 Coagulation defect, unspecified: Secondary | ICD-10-CM

## 2015-05-04 LAB — POCT INR: INR: 2.5

## 2015-05-04 NOTE — Progress Notes (Signed)
Reviewed We got there! Thanks DrG

## 2015-05-04 NOTE — Patient Instructions (Signed)
Patient given warfarin handout

## 2015-05-04 NOTE — Progress Notes (Signed)
Patient was seen in clinic with Stephens November, PharmD, PGY1 pharmacy resident. I agree with the assessment and plan of care documented by Rob.

## 2015-05-04 NOTE — Progress Notes (Signed)
Anticoagulation Management Elester Dunman is a 70 y.o. male who reports to the clinic for monitoring of warfarin treatment.    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5 mg 10 mg 7.5 mg 10 mg 7.5 mg     (5 mg x 1.5)  (5 mg x 2)  (5 mg x 1.5)  (5 mg x 2)  (5 mg x 1.5)  (5 mg x 2)  (5 mg x 1.5)                           INR today: Therapeutic  PLAN Weekly dose was unchanged. Remains at 60 mg/wk.  Patient Instructions  Patient given warfarin handout   Patient advised to contact clinic or seek medical attention if signs/symptoms of bleeding or thromboembolism occur.  Patient verbalized understanding by repeating back information and was advised to contact me if further medication-related questions arise. Patient was also provided an  information handout.  Follow-up Return around 05/18/2015 for follow up INR check  Judieth Keens, PharmD Clinical Pharmacy Resident  15 minutes spent face-to-face with the patient during the encounter.

## 2015-05-11 ENCOUNTER — Ambulatory Visit (INDEPENDENT_AMBULATORY_CARE_PROVIDER_SITE_OTHER): Payer: Medicare Other | Admitting: Pharmacist

## 2015-05-11 DIAGNOSIS — Z7901 Long term (current) use of anticoagulants: Secondary | ICD-10-CM | POA: Diagnosis not present

## 2015-05-11 DIAGNOSIS — D688 Other specified coagulation defects: Secondary | ICD-10-CM | POA: Diagnosis not present

## 2015-05-11 DIAGNOSIS — I2782 Chronic pulmonary embolism: Secondary | ICD-10-CM | POA: Diagnosis not present

## 2015-05-11 DIAGNOSIS — D689 Coagulation defect, unspecified: Secondary | ICD-10-CM

## 2015-05-11 DIAGNOSIS — D6859 Other primary thrombophilia: Secondary | ICD-10-CM

## 2015-05-11 LAB — POCT INR: INR: 2.1

## 2015-05-11 NOTE — Progress Notes (Signed)
You might want to tweak dose a little if INR still borderline therapeutic when you check again on the 17th Thanks DrG

## 2015-05-11 NOTE — Progress Notes (Signed)
Anticoagulation Management Paul Ryan is a 70 y.o. male who reports to the clinic for monitoring of warfarin treatment.    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]        Comments [id number]       ASSESSMENT Recent Results: The most recent result is correlated with 60 mg per week: Lab Results  Component Value Date   INR 2.1 05/11/2015   INR 2.5 05/04/2015   INR 2.1 04/30/2015   PROTIME 15.6* 05/17/2013   Anticoagulation Dosing: INR as of 05/11/2015 and Previous Dosing Information    INR Dt INR Goal [name] [name]   05/11/2015 2.1 2.0-3.0 60 mg 7.5 mg 10 mg 7.5 mg 10 mg 7.5 mg 10 mg 7.5 mg    Anticoagulation Dose Instructions as of 05/11/2015      Total [name]   New Dose 60 mg 7.5 mg 10 mg 7.5 mg 10 mg 7.5 mg 10 mg 7.5 mg     (5 mg x 1.5)  (5 mg x 2)  (5 mg x 1.5)  (5 mg x 2)  (5 mg x 1.5)  (5 mg x 2)  (5 mg x 1.5)                           INR today: Therapeutic  PLAN Weekly dose was unchanged   Patient Instructions  Patient educated about medication as defined in this encounter and verbalized understanding by repeating back instructions provided.    Patient advised to contact clinic or seek medical attention if signs/symptoms of bleeding or thromboembolism occur.  Patient verbalized understanding by repeating back information and was advised to contact me  if further medication-related questions arise. Patient was also provided an information handout.  Follow-up Return in about 1 week (around 05/18/2015).  Paul Ryan J

## 2015-05-11 NOTE — Patient Instructions (Signed)
Patient educated about medication as defined in this encounter and verbalized understanding by repeating back instructions provided.   

## 2015-05-18 ENCOUNTER — Ambulatory Visit (INDEPENDENT_AMBULATORY_CARE_PROVIDER_SITE_OTHER): Payer: Medicare Other | Admitting: Pharmacist

## 2015-05-18 DIAGNOSIS — I2699 Other pulmonary embolism without acute cor pulmonale: Secondary | ICD-10-CM

## 2015-05-18 DIAGNOSIS — D688 Other specified coagulation defects: Secondary | ICD-10-CM | POA: Diagnosis not present

## 2015-05-18 DIAGNOSIS — D689 Coagulation defect, unspecified: Secondary | ICD-10-CM

## 2015-05-18 DIAGNOSIS — Z7901 Long term (current) use of anticoagulants: Secondary | ICD-10-CM | POA: Diagnosis not present

## 2015-05-18 DIAGNOSIS — I82409 Acute embolism and thrombosis of unspecified deep veins of unspecified lower extremity: Secondary | ICD-10-CM | POA: Diagnosis not present

## 2015-05-18 DIAGNOSIS — D6859 Other primary thrombophilia: Secondary | ICD-10-CM

## 2015-05-18 LAB — POCT INR: INR: 1.9

## 2015-05-18 NOTE — Progress Notes (Signed)
Anticoagulation Management Paul Ryan is a 70 y.o. male who reports to the clinic for monitoring of warfarin treatment.    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]        Comments [id number]       ASSESSMENT Recent Results: The most recent result is correlated with 60 mg per week: Lab Results  Component Value Date   INR 1.9 05/18/2015   INR 2.1 05/11/2015   INR 2.5 05/04/2015   PROTIME 15.6* 05/17/2013    Anticoagulation Dosing: INR as of 05/18/2015 and Previous Dosing Information    INR Dt INR Goal [name] [name]   05/18/2015 1.9 2.0-3.0 60 mg 7.5 mg 10 mg 7.5 mg 10 mg 7.5 mg 10 mg 7.5 mg    Anticoagulation Dose Instructions as of 05/18/2015      Total [name]   New Dose 65 mg 7.5 mg 10 mg 10 mg 10 mg 10 mg 10 mg 7.5 mg     (5 mg x 1.5)  (5 mg x 2)  (5 mg x 2)  (5 mg x 2)  (5 mg x 2)  (5 mg x 2)  (5 mg x 1.5)                           INR today: Subtherapeutic  PLAN Weekly dose was increased by 8% to 65 mg per week  Patient Instructions  DoseResponse handout given.  Patient advised to contact clinic or seek medical attention if signs/symptoms of bleeding or thromboembolism occur.  Patient verbalized understanding by repeating back information and was advised to contact me if further medication-related questions arise. Patient was also provided an information  handout.  Follow-up 06/01/2015  Judieth Keens, PharmD Clinical Pharmacy Resident  20 minutes spent face-to-face with the patient during the encounter.

## 2015-05-18 NOTE — Patient Instructions (Signed)
DoseResponse handout given.

## 2015-05-24 ENCOUNTER — Telehealth: Payer: Self-pay | Admitting: *Deleted

## 2015-05-24 NOTE — Progress Notes (Signed)
I have reviewed Dr. Julianne Rice note.  Patient is on [facility] for coagulopathy, INR low and coumadin increased.

## 2015-05-24 NOTE — Telephone Encounter (Signed)
INR 1.7 today

## 2015-05-25 ENCOUNTER — Ambulatory Visit (INDEPENDENT_AMBULATORY_CARE_PROVIDER_SITE_OTHER): Payer: Medicare Other | Admitting: Pharmacist

## 2015-05-25 DIAGNOSIS — D688 Other specified coagulation defects: Secondary | ICD-10-CM | POA: Diagnosis not present

## 2015-05-25 DIAGNOSIS — D6859 Other primary thrombophilia: Secondary | ICD-10-CM

## 2015-05-25 DIAGNOSIS — Z7901 Long term (current) use of anticoagulants: Secondary | ICD-10-CM | POA: Diagnosis not present

## 2015-05-25 DIAGNOSIS — I2782 Chronic pulmonary embolism: Secondary | ICD-10-CM

## 2015-05-25 DIAGNOSIS — D689 Coagulation defect, unspecified: Secondary | ICD-10-CM

## 2015-05-25 LAB — POCT INR: INR: 1.7

## 2015-05-25 NOTE — Patient Instructions (Signed)
Patient educated about medication as defined in this encounter and verbalized understanding by repeating back instructions provided.   

## 2015-05-25 NOTE — Progress Notes (Signed)
Anticoagulation Management Paul Ryan is a 70 y.o. male who reports to the clinic for monitoring of warfarin treatment.    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 Patient has 2 more months of rifampin therapy remaining. Patient has a home INR device and does have supply of enoxaparin ($10 copay).   Anticoagulation Episode Summary    Current INR goal 2.0-3.0  Next INR check 06/01/2015  INR from last check 1.7! 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5 mg 10 mg 12.5 mg 10 mg     (5 mg x 2)  (5 mg x 2)  (5 mg x 2)  (5 mg x 2.5)  (5 mg x 2)  (5 mg x 2.5)  (5 mg x 2)                         Description        Initiate enoxaparin injection daily until INR therapeutic      INR today: Subtherapeutic  PLAN Weekly dose was increased by 15% to 75 mg per week. Initiate enoxaparin 120 mg daily until INR therapeutic.  Patient Instructions  Patient educated about medication as defined in this  encounter and verbalized understanding by repeating back instructions provided.   Patient advised to contact clinic or seek medical attention if signs/symptoms of bleeding or thromboembolism occur.  Patient verbalized understanding by repeating back information and was advised to contact me if further medication-related questions arise. Patient was also provided an information handout.  Follow-up Return in about 1 week (around 06/01/2015).  [name],[name]

## 2015-05-25 NOTE — Progress Notes (Signed)
Reviewed Thanks drG 

## 2015-05-28 ENCOUNTER — Other Ambulatory Visit: Payer: Self-pay | Admitting: Family Medicine

## 2015-06-01 ENCOUNTER — Ambulatory Visit (INDEPENDENT_AMBULATORY_CARE_PROVIDER_SITE_OTHER): Payer: Medicare Other | Admitting: Pharmacist

## 2015-06-01 ENCOUNTER — Telehealth: Payer: Self-pay | Admitting: Pharmacist

## 2015-06-01 DIAGNOSIS — I2782 Chronic pulmonary embolism: Secondary | ICD-10-CM

## 2015-06-01 DIAGNOSIS — Z7901 Long term (current) use of anticoagulants: Secondary | ICD-10-CM

## 2015-06-01 DIAGNOSIS — D689 Coagulation defect, unspecified: Secondary | ICD-10-CM

## 2015-06-01 DIAGNOSIS — D6859 Other primary thrombophilia: Secondary | ICD-10-CM

## 2015-06-01 DIAGNOSIS — D688 Other specified coagulation defects: Secondary | ICD-10-CM | POA: Diagnosis not present

## 2015-06-01 LAB — POCT INR: INR: 2.6

## 2015-06-01 MED ORDER — WARFARIN SODIUM 5 MG PO TABS: ORAL_TABLET | ORAL | Status: DC

## 2015-06-01 NOTE — Patient Instructions (Signed)
DoseResponse handout given

## 2015-06-01 NOTE — Progress Notes (Signed)
Patient was seen in clinic by Stephens November, PharmD, PGY1 pharmacy resident. I agree with the assessment and plan of care documented.

## 2015-06-01 NOTE — Progress Notes (Signed)
Anticoagulation Management Paul Ryan is a 70 y.o. male who reports to the clinic for monitoring of warfarin treatment.    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]        Comments DR [id number]       ASSESSMENT Recent Results: The most recent result is correlated with 75 mg per week: Lab Results  Component Value Date   INR 2.6 06/01/2015   INR 1.7 05/25/2015   INR 1.9 05/18/2015   PROTIME 15.6* 05/17/2013    Anticoagulation Dosing: INR as of 06/01/2015 and Previous Dosing Information    INR Dt INR Goal [name] [name]   06/01/2015 2.6 2.0-3.0 75 mg 10 mg 10 mg 10 mg 12.5 mg 10 mg 12.5 mg 10 mg    Previous description        Initiate enoxaparin injection daily until INR therapeutic    Anticoagulation Dose Instructions as of 06/01/2015      Total [name]   New Dose 75 mg 10 mg 10 mg 10 mg 12.5 mg 10 mg 12.5 mg 10 mg     (5 mg x 2)  (5 mg x 2)  (5 mg x 2)  (5 mg x 2.5)  (5 mg x 2)  (5 mg x 2.5)  (5 mg x 2)                           INR today: Therapeutic  PLAN Weekly dose was unchanged. Pt travelling to [place] for approx. 2 months on 04/04. Will follow up on 04/03 prior to departure. Pt has a home supply of lovenox.  Patient Instructions  DoseResponse handout given  Patient advised to contact clinic or seek medical attention if signs/symptoms of bleeding or  thromboembolism occur.  Patient verbalized understanding by repeating back information and was advised to contact me if further medication-related questions arise. Patient was also provided an information handout.  Follow-up 06/04/15  Judieth Keens, PharmD  10 minutes spent face-to-face with the patient during the encounter. 10% of time spent on education. 90% of time was spent on assessment and plan.

## 2015-06-01 NOTE — Telephone Encounter (Signed)
Patient requested warfarin prescription refill

## 2015-06-04 ENCOUNTER — Telehealth: Payer: Self-pay | Admitting: Oncology

## 2015-06-04 MED ORDER — WARFARIN SODIUM 5 MG PO TABS: ORAL_TABLET | ORAL | Status: DC

## 2015-06-04 NOTE — Telephone Encounter (Signed)
[facility] gave [facility] the order to dispense 130 warafin 5mg , will ready for pt. Phones have been out of order today per pharmacist

## 2015-06-04 NOTE — Telephone Encounter (Signed)
Dr Maudie Mercury and the pharm resident have [facility] to [facility], i have called also going through the main store # and pharm #, evidently there is something wrong w/ the ph system, i have called pt and ask him to go by [facility] and call me when he arrives and i will verbally give the ok for 2 months of warafin

## 2015-06-04 NOTE — Addendum Note (Signed)
Addended by: Forde Dandy on: 06/04/2015 11:24 AM   Modules accepted: Orders, Medications

## 2015-06-04 NOTE — Telephone Encounter (Signed)
Pt needs 2 months worth today, he is going out of country for 2 months tomorrow and needs to pick 2 months worth up asap, i will be glad to call pharm, please advise

## 2015-06-04 NOTE — Telephone Encounter (Signed)
This is fine with me   

## 2015-06-04 NOTE — Telephone Encounter (Signed)
[facility]

## 2015-06-06 ENCOUNTER — Ambulatory Visit (INDEPENDENT_AMBULATORY_CARE_PROVIDER_SITE_OTHER): Payer: Medicare Other | Admitting: Pharmacist

## 2015-06-06 DIAGNOSIS — I2782 Chronic pulmonary embolism: Secondary | ICD-10-CM | POA: Diagnosis not present

## 2015-06-06 DIAGNOSIS — Z7901 Long term (current) use of anticoagulants: Secondary | ICD-10-CM | POA: Diagnosis not present

## 2015-06-06 DIAGNOSIS — D688 Other specified coagulation defects: Secondary | ICD-10-CM

## 2015-06-06 DIAGNOSIS — D689 Coagulation defect, unspecified: Secondary | ICD-10-CM

## 2015-06-06 DIAGNOSIS — D6859 Other primary thrombophilia: Secondary | ICD-10-CM

## 2015-06-06 LAB — POCT INR: INR: 1.7

## 2015-06-07 NOTE — Patient Instructions (Signed)
Patient educated about medication as defined in this encounter and verbalized understanding by repeating back instructions provided.   

## 2015-06-07 NOTE — Progress Notes (Signed)
Anticoagulation Management Paul Ryan is a 70 y.o. male who reports to the clinic for monitoring of warfarin treatment.    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]        Comments [id number]       ASSESSMENT Recent Results: The most recent result is correlated with 75 mg per week: Lab Results  Component Value Date   INR 1.7 06/06/2015   INR 2.6 06/01/2015   INR 1.7 05/25/2015   PROTIME 15.6* 05/17/2013   Anticoagulation Dosing: INR as of 06/06/2015 and Previous Dosing Information    INR Dt INR Goal [name] [name]   06/06/2015 1.7 2.0-3.0 75 mg 10 mg 10 mg 10 mg 12.5 mg 10 mg 12.5 mg 10 mg    Anticoagulation Dose Instructions as of 06/06/2015      Total [name]   New Dose 87.5 mg 12.5 mg 12.5 mg 12.5 mg 12.5 mg 12.5 mg 12.5 mg 12.5 mg     (5 mg x 2.5)  (5 mg x 2.5)  (5 mg x 2.5)  (5 mg x 2.5)  (5 mg x 2.5)  (5 mg x 2.5)  (5 mg x 2.5)                           INR today: Subtherapeutic  PLAN Weekly dose was increased by 17% to 87.5 mg per week, patient to reinitiate enoxaparin injections until INR therapeutic.  Patient Instructions  Patient educated about medication as defined in this encounter and verbalized understanding by repeating back instructions provided.    Patient advised to contact clinic or seek medical attention if signs/symptoms of bleeding or  thromboembolism occur.  Patient verbalized understanding by repeating back information and was advised to contact me if further medication-related questions arise. Patient was also provided an information handout.  Follow-up Return in about 1 week (around 06/13/2015).  [name],[name]

## 2015-06-07 NOTE — Progress Notes (Signed)
Reviewed Thanks Dr Darnell Level He leaves for [place] soon - need to get him therapeutic !

## 2015-06-25 ENCOUNTER — Telehealth: Payer: Self-pay | Admitting: Family Medicine

## 2015-06-25 ENCOUNTER — Other Ambulatory Visit: Payer: Self-pay | Admitting: Oncology

## 2015-06-25 DIAGNOSIS — D6859 Other primary thrombophilia: Secondary | ICD-10-CM

## 2015-06-25 DIAGNOSIS — D689 Coagulation defect, unspecified: Secondary | ICD-10-CM

## 2015-06-25 DIAGNOSIS — Z7901 Long term (current) use of anticoagulants: Secondary | ICD-10-CM

## 2015-06-25 NOTE — Telephone Encounter (Signed)
APPT. REMINDER CALL, LMTCB °

## 2015-06-26 ENCOUNTER — Other Ambulatory Visit: Payer: Medicare Other

## 2015-06-27 ENCOUNTER — Ambulatory Visit (INDEPENDENT_AMBULATORY_CARE_PROVIDER_SITE_OTHER): Payer: Medicare Other | Admitting: Pharmacist

## 2015-06-27 DIAGNOSIS — Z7901 Long term (current) use of anticoagulants: Secondary | ICD-10-CM | POA: Diagnosis not present

## 2015-06-27 DIAGNOSIS — I2782 Chronic pulmonary embolism: Secondary | ICD-10-CM | POA: Diagnosis not present

## 2015-06-27 DIAGNOSIS — D688 Other specified coagulation defects: Secondary | ICD-10-CM

## 2015-06-27 DIAGNOSIS — D6859 Other primary thrombophilia: Secondary | ICD-10-CM

## 2015-06-27 DIAGNOSIS — D689 Coagulation defect, unspecified: Secondary | ICD-10-CM

## 2015-06-27 LAB — POCT INR: INR: 2.3

## 2015-06-27 NOTE — Progress Notes (Signed)
Reviewed Thanks Finally therapeutic! DrG

## 2015-06-27 NOTE — Progress Notes (Signed)
Anticoagulation Management Paul Ryan is a 70 y.o. male who reports to the clinic for monitoring of warfarin treatment.    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]        Comments DR [id number]       ASSESSMENT Recent Results: The most recent result is correlated with 82.5 mg per week: Lab Results  Component Value Date   INR 2.3 06/27/2015   INR 1.7 06/06/2015   INR 2.6 06/01/2015   PROTIME 15.6* 05/17/2013   Anticoagulation Dosing: INR as of 06/27/2015 and Previous Dosing Information    INR Dt INR Goal Wkly Tot [name]     2.0-3.0 82.5 mg 10 mg 12.5 mg 12.5 mg 12.5 mg 12.5 mg 10 mg 12.5 mg    Anticoagulation Dose Instructions as of 06/27/2015      Total [name]   New Dose 82.5 mg 10 mg 12.5 mg 12.5 mg 12.5 mg 12.5 mg 10 mg 12.5 mg     (5 mg x 2)  (5 mg x 2.5)  (5 mg x 2.5)  (5 mg x 2.5)  (5 mg x 2.5)  (5 mg x 2)  (5 mg x 2.5)                           INR today: Therapeutic  PLAN Weekly dose was unchanged  Patient Instructions  Patient educated about medication as defined in this encounter and verbalized understanding by repeating back instructions provided.    Patient advised to contact clinic or seek medical attention if signs/symptoms of bleeding or thromboembolism occur.  Patient verbalized understanding by repeating back information and was advised to  contact me if further medication-related questions arise. Patient was also provided an information handout.  Follow-up Return in about 1 week (around 07/04/2015).  [name] J

## 2015-06-27 NOTE — Patient Instructions (Signed)
Patient educated about medication as defined in this encounter and verbalized understanding by repeating back instructions provided.   

## 2015-07-19 ENCOUNTER — Encounter: Payer: Self-pay | Admitting: Pharmacist

## 2015-07-19 NOTE — Progress Notes (Signed)
Anticoagulation Management Paul Ryan is a 70 y.o. male who reports to the clinic for monitoring of warfarin treatment.    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]        Comments [id number]       ASSESSMENT Recent Results: The most recent result is correlated with 87.5 mg per week: Lab Results  Component Value Date   INR 2.9 07/20/2015   INR 2.3 06/27/2015   INR 1.7 06/06/2015   PROTIME 15.6* 05/17/2013   Anticoagulation Dosing: INR as of 06/27/2015 and Previous Dosing Information    INR Dt INR Goal [name] [name]   06/27/2015 2.3 2.0-3.0 87.5 mg 12.5 mg 12.5 mg 12.5 mg 12.5 mg 12.5 mg 12.5 mg 12.5 mg    Anticoagulation Dose Instructions as of 06/27/2015      Total [name]   New Dose 82.5 mg 10 mg 12.5 mg 12.5 mg 12.5 mg 12.5 mg 10 mg 12.5 mg     (5 mg x 2)  (5 mg x 2.5)  (5 mg x 2.5)  (5 mg x 2.5)  (5 mg x 2.5)  (5 mg x 2)  (5 mg x 2.5)                           INR today: Therapeutic  PLAN Weekly dose was unchanged  There are no Patient Instructions on file for this visit. Patient advised to contact clinic or seek medical attention if signs/symptoms of bleeding or thromboembolism occur.  Patient verbalized understanding by repeating back information and was advised to contact me if further medication-related questions arise. Patient was also provided an  information handout.  Follow-up No Follow-up on file.  [name],[name]

## 2015-07-19 NOTE — Addendum Note (Signed)
Addended by: Forde Dandy on: 07/19/2015 03:50 PM   Modules accepted: Orders

## 2015-07-20 LAB — POCT INR: INR: 2.9

## 2015-07-25 ENCOUNTER — Encounter: Payer: Self-pay | Admitting: Pharmacist

## 2015-07-25 NOTE — Progress Notes (Signed)
Patient ID: Paul Ryan, male   DOB: 02-Aug-1945, 70 y.o.   MRN: [id number] Anticoagulation Management Paul Ryan is a 70 y.o. male who reports to the clinic for monitoring of warfarin treatment.    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  Anticoagulation Clinic Visit History: Patient does not report signs/symptoms of bleeding or thromboembolism and reports no changes  Anticoagulation Episode Summary    Current INR goal 2.0-3.0  Next INR check 07/04/2015  INR from last check 2.3 (06/27/2015)  Most recent INR 2.9 (07/20/2015)  Weekly max dose   [facility] end date   INR check location   Preferred lab   Send INR reminders to    Indications  Chronic anticoagulation [Z79.01] Coagulopathy ([place]) [[name]] DVT (deep venous thrombosis) ([place]) [[name]] Pulmonary embolism ([place]) [[name]] 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]        Comments [id number]       ASSESSMENT Recent Results: The most recent result is correlated with 85 mg per week: Lab Results  Component Value Date   INR 2.9 07/20/2015   INR 2.3 06/27/2015   INR 1.7 06/06/2015   PROTIME 15.6* 05/17/2013   Anticoagulation Dosing:    Total [name]   New Dose 85 mg 10 mg 12.5 mg 12.5 mg 12.5 mg 12.5 mg 12.5 mg 12.5 mg     (5 mg x 2)  (5 mg x 2.5)  (5 mg x 2.5)  (5 mg x 2.5)  (5 mg x 2.5)  (5 mg x 2.5)  (5 mg x 2.5)                           INR today: Therapeutic  PLAN Weekly dose was unchanged   Patient advised to contact clinic or seek medical attention if signs/symptoms of bleeding or thromboembolism occur.  Patient verbalized understanding by repeating back information and was advised to contact me if further medication-related questions arise. Patient was also provided an information handout.  Follow-up 1-2 weeks, patient returns from [place] next week and will be finishing course of rifampin   Kristyanna Barcelo J

## 2015-08-10 LAB — PROTIME-INR: INR: 2.2

## 2015-08-11 ENCOUNTER — Encounter: Payer: Self-pay | Admitting: Oncology

## 2015-08-15 ENCOUNTER — Ambulatory Visit (INDEPENDENT_AMBULATORY_CARE_PROVIDER_SITE_OTHER): Payer: Medicare Other | Admitting: Pharmacist

## 2015-08-15 ENCOUNTER — Other Ambulatory Visit (INDEPENDENT_AMBULATORY_CARE_PROVIDER_SITE_OTHER): Payer: Medicare Other

## 2015-08-15 DIAGNOSIS — D689 Coagulation defect, unspecified: Secondary | ICD-10-CM | POA: Diagnosis not present

## 2015-08-15 DIAGNOSIS — D6859 Other primary thrombophilia: Secondary | ICD-10-CM | POA: Diagnosis not present

## 2015-08-15 DIAGNOSIS — Z7901 Long term (current) use of anticoagulants: Secondary | ICD-10-CM

## 2015-08-15 DIAGNOSIS — I2782 Chronic pulmonary embolism: Secondary | ICD-10-CM | POA: Diagnosis not present

## 2015-08-15 LAB — POCT INR: INR: 3

## 2015-08-15 NOTE — Progress Notes (Signed)
Reviewed Thanks Fantastic job Monday! Dr Darnell Level

## 2015-08-15 NOTE — Patient Instructions (Signed)
Patient educated about medication as defined in this encounter and verbalized understanding by repeating back instructions provided.   

## 2015-08-15 NOTE — Progress Notes (Signed)
Anticoagulation Management Paul Ryan is a 70 y.o. male who reports to the clinic for monitoring of warfarin treatment.    Indication: DVT Duration: indefinite  Anticoagulation Clinic Visit History: Patient does not report signs/symptoms of bleeding or thromboembolism. He reports some mild cold intolerance which is not problematic. Patient stopped rifampin therapy 2 weeks ago, so will monitor response closely for anticipated dose reductions.  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]        Comments [id number]       ASSESSMENT Recent Results: The most recent result is correlated with 82.5 mg per week: Lab Results  Component Value Date   INR 3.0 08/15/2015   INR 2.9 07/20/2015   INR 2.2 07/11/2015   PROTIME 15.6* 05/17/2013   Anticoagulation Dosing: INR as of 08/15/2015 and Previous Dosing Information    INR Dt INR Goal [name] [name]   08/15/2015 3.0 2.0-3.0 85 mg 10 mg 12.5 mg 12.5 mg 12.5 mg 12.5 mg 12.5 mg 12.5 mg    Anticoagulation Dose Instructions as of 08/15/2015      Total [name]   New Dose 70 mg 10 mg 10 mg 10 mg 10 mg 10 mg 10 mg 10 mg     (5 mg x 2)  (5 mg x 2)  (5 mg x 2)  (5 mg x 2)  (5 mg x 2)  (5 mg x 2)  (5 mg x 2)                           INR today: Therapeutic  PLAN Weekly dose was decreased by 18% to 70 mg per week due to completion of rifampin therapy  Patient Instructions  Patient educated about medication as defined in this encounter and verbalized understanding by repeating back instructions provided.   Patient  advised to contact clinic or seek medical attention if signs/symptoms of bleeding or thromboembolism occur.  Patient verbalized understanding by repeating back information and was advised to contact me if further medication-related questions arise. Patient was also provided an information handout.  Follow-up No Follow-up on file.  Avelynn Sellin J  30 minutes spent face-to-face with the patient during the encounter. 50% of time spent on education. 50% of time was spent on assessment and plan.

## 2015-08-16 LAB — CBC WITH DIFFERENTIAL/PLATELET
BASOS ABS: 0 10*3/uL (ref 0.0–0.2)
BASOS: 0 %
EOS (ABSOLUTE): 0.1 10*3/uL (ref 0.0–0.4)
Eos: 2 %
Hematocrit: 42.9 % (ref 37.5–51.0)
Hemoglobin: 14.7 g/dL (ref 12.6–17.7)
Immature Grans (Abs): 0 10*3/uL (ref 0.0–0.1)
Immature Granulocytes: 0 %
LYMPHS: 31 %
Lymphocytes Absolute: 1.8 10*3/uL (ref 0.7–3.1)
MCH: 30.4 pg (ref 26.6–33.0)
MCHC: 34.3 g/dL (ref 31.5–35.7)
MCV: 89 fL (ref 79–97)
MONOS ABS: 0.4 10*3/uL (ref 0.1–0.9)
Monocytes: 7 %
NEUTROS ABS: 3.5 10*3/uL (ref 1.4–7.0)
Neutrophils: 60 %
PLATELETS: 178 10*3/uL (ref 150–379)
RBC: 4.83 x10E6/uL (ref 4.14–5.80)
RDW: 15.3 % (ref 12.3–15.4)
WBC: 5.8 10*3/uL (ref 3.4–10.8)

## 2015-08-22 ENCOUNTER — Telehealth: Payer: Self-pay | Admitting: Pharmacist

## 2015-08-22 NOTE — Telephone Encounter (Signed)
Patient called with home INR result of 6.9. Reports no signs/symptoms of bleeding or thromboembolism. No history of bleeding, low bleed risk according to RIETE criteria (score of 0), recent H/H WNL and stable. Patient advised to hold warfarin today and tomorrow, follow up INR on Friday in clinic. Patient also instructed to call or come to clinic sooner if any symptoms or concerns arise.  Patient verbalized understanding by repeat back.

## 2015-08-24 ENCOUNTER — Ambulatory Visit (INDEPENDENT_AMBULATORY_CARE_PROVIDER_SITE_OTHER): Payer: Medicare Other | Admitting: Pharmacist

## 2015-08-24 DIAGNOSIS — D688 Other specified coagulation defects: Secondary | ICD-10-CM | POA: Diagnosis not present

## 2015-08-24 DIAGNOSIS — D6859 Other primary thrombophilia: Secondary | ICD-10-CM

## 2015-08-24 DIAGNOSIS — I2782 Chronic pulmonary embolism: Secondary | ICD-10-CM

## 2015-08-24 DIAGNOSIS — Z7901 Long term (current) use of anticoagulants: Secondary | ICD-10-CM

## 2015-08-24 DIAGNOSIS — D689 Coagulation defect, unspecified: Secondary | ICD-10-CM

## 2015-08-24 LAB — POCT INR: INR: 3.3

## 2015-08-24 NOTE — Progress Notes (Signed)
Anticoagulation Management Paul Ryan is a 70 y.o. male who reports to the clinic for monitoring of warfarin treatment.    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  Anticoagulation Clinic Visit History: Patient does not report signs/symptoms of bleeding or thromboembolism   Anticoagulation Episode Summary    Current INR goal 2.0-3.0  Next INR check 08/29/2015  INR from last check 3.3! 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  Patient Instructions  Patient educated about medication as defined in this encounter and verbalized understanding by repeating back instructions provided.    Patient advised to contact clinic or seek medical attention if signs/symptoms of bleeding or thromboembolism occur.  Patient verbalized understanding by repeating back information and was advised to contact me if further medication-related questions arise. Patient was also provided an information handout.  Follow-up 08/29/15  Dayannara Pascal J

## 2015-08-24 NOTE — Patient Instructions (Signed)
Patient educated about medication as defined in this encounter and verbalized understanding by repeating back instructions provided.   

## 2015-08-26 NOTE — Progress Notes (Signed)
Reviewed Thanks DrG 

## 2015-08-29 ENCOUNTER — Encounter: Payer: Self-pay | Admitting: Pharmacist

## 2015-08-29 LAB — POCT INR: INR: 5.1

## 2015-08-29 NOTE — Progress Notes (Signed)
Patient ID: Paul Ryan, male   DOB: 1946/02/04, 70 y.o.   MRN: [id number] Anticoagulation Management [name] is a 70 y.o. male who reports to the clinic for monitoring of warfarin treatment.    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  Anticoagulation Clinic Visit History: Patient does not report signs/symptoms of bleeding or thromboembolism  Anticoagulation Episode Summary    Current INR goal 2.0-3.0  Next INR check 08/29/2015  INR from last check 3.3! (08/24/2015)  Most recent INR 5.1! 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  Patient advised to contact clinic or seek medical attention if signs/symptoms of bleeding or thromboembolism occur.  Patient verbalized understanding by repeating back information and was advised to contact me if further medication-related questions arise. Patient was also provided an information handout.  Follow-up 1 week  [name],[name]

## 2015-08-29 NOTE — Addendum Note (Signed)
Addended by: Forde Dandy on: 08/29/2015 02:04 PM   Modules accepted: Orders

## 2015-08-30 ENCOUNTER — Telehealth: Payer: Self-pay | Admitting: Oncology

## 2015-08-30 NOTE — Telephone Encounter (Signed)
Calling to give INR results 5.3 for 08/29/15

## 2015-09-05 ENCOUNTER — Telehealth (INDEPENDENT_AMBULATORY_CARE_PROVIDER_SITE_OTHER): Payer: Medicare Other | Admitting: Pharmacist

## 2015-09-05 DIAGNOSIS — I82509 Chronic embolism and thrombosis of unspecified deep veins of unspecified lower extremity: Secondary | ICD-10-CM

## 2015-09-05 DIAGNOSIS — D6859 Other primary thrombophilia: Secondary | ICD-10-CM | POA: Diagnosis not present

## 2015-09-05 DIAGNOSIS — Z7901 Long term (current) use of anticoagulants: Secondary | ICD-10-CM

## 2015-09-06 ENCOUNTER — Other Ambulatory Visit: Payer: Self-pay | Admitting: Family Medicine

## 2015-09-06 ENCOUNTER — Encounter: Payer: Self-pay | Admitting: Family Medicine

## 2015-09-06 DIAGNOSIS — Z79899 Other long term (current) drug therapy: Secondary | ICD-10-CM

## 2015-09-06 DIAGNOSIS — E785 Hyperlipidemia, unspecified: Secondary | ICD-10-CM

## 2015-09-06 MED ORDER — ROSUVASTATIN CALCIUM 20 MG PO TABS: ORAL_TABLET | ORAL | Status: DC

## 2015-09-07 ENCOUNTER — Ambulatory Visit: Payer: Medicare Other | Admitting: Pharmacist

## 2015-09-07 ENCOUNTER — Ambulatory Visit (INDEPENDENT_AMBULATORY_CARE_PROVIDER_SITE_OTHER): Payer: Medicare Other | Admitting: Pharmacist

## 2015-09-07 DIAGNOSIS — I2782 Chronic pulmonary embolism: Secondary | ICD-10-CM | POA: Diagnosis not present

## 2015-09-07 DIAGNOSIS — Z7901 Long term (current) use of anticoagulants: Secondary | ICD-10-CM | POA: Diagnosis not present

## 2015-09-07 DIAGNOSIS — D689 Coagulation defect, unspecified: Secondary | ICD-10-CM

## 2015-09-07 DIAGNOSIS — D6859 Other primary thrombophilia: Secondary | ICD-10-CM | POA: Diagnosis not present

## 2015-09-07 DIAGNOSIS — I82509 Chronic embolism and thrombosis of unspecified deep veins of unspecified lower extremity: Secondary | ICD-10-CM | POA: Diagnosis not present

## 2015-09-07 LAB — POCT INR
INR: 2.7
INR: 3.5

## 2015-09-07 NOTE — Progress Notes (Addendum)
Anticoagulation Management Paul Ryan is a 70 y.o. male who reports to the clinic for monitoring of warfarin treatment.    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  Anticoagulation Clinic Visit History: Patient does not report signs/symptoms of bleeding or thromboembolism  Anticoagulation Episode Summary    Current INR goal 2.0-3.0  Next INR check 09/12/2015  INR from last check 3.5! 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]        Comments [id number]       ASSESSMENT Recent Results: The most recent result is correlated with 35 mg per week: Lab Results  Component Value Date   INR 3.5 09/07/2015   INR 2.7 09/07/2015   INR 5.1 08/29/2015   PROTIME 15.6* 05/17/2013   Anticoagulation Dosing: INR as of 09/07/2015 and Previous Dosing Information    INR Dt INR Goal [name] [name]   09/07/2015 3.5 2.0-3.0 35 mg 5 mg 5 mg 5 mg 5 mg 5 mg 5 mg 5 mg    Anticoagulation Dose Instructions as of 09/07/2015      Total [name]   New Dose 27.5 mg 5 mg 2.5 mg 5 mg 2.5 mg 5 mg 2.5 mg 2.5 mg     (5 mg x 1)  (5 mg x 0.5)  (5 mg x 1)  (5 mg x 0.5)  (5 mg x 1)  (5 mg x 0.5)  (5 mg x 0.5)                           INR today: Supratherapeutic  PLAN Weekly dose was decreased 25 mg per week  Patient Instructions  Patient educated about medication as defined in this encounter and verbalized understanding by repeating back instructions provided.   Patient advised to contact clinic or seek medical attention if signs/symptoms of bleeding or thromboembolism occur.  Patient verbalized understanding by repeating back information and was advised to contact me  if further medication-related questions arise. Patient was also provided an information handout.  Follow-up 1 week  [name],[name]  15 minutes spent face-to-face with the patient during the encounter. 50% of time spent on education. 50% of time was spent on assessment and plan.

## 2015-09-07 NOTE — Telephone Encounter (Signed)
Anticoagulation Management Paul Ryan is a 70 y.o. male who reports to the clinic for monitoring of warfarin treatment.    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  Anticoagulation Clinic Visit History: Patient does not report signs/symptoms of bleeding or thromboembolism  ASSESSMENT Recent Results: The most recent result is correlated with 25 mg per week: Lab Results  Component Value Date   INR 2.7 09/07/2015   INR 5.1 08/29/2015   INR 3.3 08/24/2015   PROTIME 15.6* 05/17/2013   Anticoagulation Dosing:    Total [name]   Previous Dose 25 mg 5 mg 5 mg 5 mg 0 0 5 mg 5 mg              New Dose 27.5 mg 2.5 mg 5 mg 2.5 mg 5 mg 5 mg 2.5 mg 5 mg                INR today: Therapeutic  PLAN New dose as outlined above  Patient advised to contact clinic or seek medical attention if signs/symptoms of bleeding or thromboembolism occur.  Patient verbalized understanding by repeating back information and was advised to contact me if further medication-related questions arise. Patient was also provided an information handout.  Follow-up 1 week.  Paul Ryan J

## 2015-09-07 NOTE — Patient Instructions (Signed)
Patient educated about medication as defined in this encounter and verbalized understanding by repeating back instructions provided.   

## 2015-09-07 NOTE — Progress Notes (Signed)
Reviewed [place] all over the map! Thanks Dr Darnell Level

## 2015-09-07 NOTE — Telephone Encounter (Signed)
OK - thanks

## 2015-09-12 ENCOUNTER — Telehealth: Payer: Self-pay | Admitting: Pharmacist

## 2015-09-12 ENCOUNTER — Ambulatory Visit: Payer: Medicare Other | Admitting: Pharmacist

## 2015-09-12 NOTE — Telephone Encounter (Addendum)
Anticoagulation Management Paul Ryan is a 70 y.o. male who called for warfarin monitoring.    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 Patient finished rifampin therapy x approx. 1 month ago and now working on stabilizing INR.   ASSESSMENT Recent Results: The most recent result is correlated with 25 mg per week: Lab Results  Component Value Date   INR 2.7 09/12/2015   INR 3.5 09/07/2015   INR 5.1 08/29/2015   PROTIME 15.6* 05/17/2013   Anticoagulation Dosing:    Total [name]   New Dose 25 mg 5 mg 2.5 mg 5 mg 2.5 mg 5 mg 2.5 mg 2.5 mg     (5 mg x 1)  (5 mg x 0.5)  (5 mg x 1)  (5 mg x 0.5)  (5 mg x 1)  (5 mg x 0.5)  (5 mg x 0.5)                           INR today: Therapeutic  PLAN Weekly dose was unchanged   Patient advised to contact clinic or seek medical attention if signs/symptoms of bleeding or thromboembolism occur.  Patient verbalized understanding by repeating back information and was advised to contact me if further medication-related questions arise. Patient was also provided an information handout.  Follow-up 1 week, patient is away in [place]. Results above are from [place].  [name],[name]

## 2015-09-19 NOTE — Telephone Encounter (Signed)
LVM advising patient to call office and schedule appointment.

## 2015-09-20 ENCOUNTER — Encounter: Payer: Self-pay | Admitting: Family Medicine

## 2015-09-21 ENCOUNTER — Telehealth: Payer: Self-pay | Admitting: *Deleted

## 2015-09-21 NOTE — Telephone Encounter (Signed)
Out of range INR done 09/19/2015  INR= 1.7

## 2015-09-26 ENCOUNTER — Telehealth: Payer: Self-pay | Admitting: Oncology

## 2015-09-26 NOTE — Telephone Encounter (Signed)
Calling to give result tested today INR 1.7

## 2015-10-03 NOTE — Telephone Encounter (Signed)
Verbally discussed w/ [name]

## 2015-10-05 ENCOUNTER — Encounter: Payer: Self-pay | Admitting: Rheumatology

## 2015-10-05 ENCOUNTER — Encounter: Payer: Self-pay | Admitting: Family Medicine

## 2015-10-10 ENCOUNTER — Ambulatory Visit (INDEPENDENT_AMBULATORY_CARE_PROVIDER_SITE_OTHER): Payer: Medicare Other | Admitting: Pharmacist

## 2015-10-10 DIAGNOSIS — I82509 Chronic embolism and thrombosis of unspecified deep veins of unspecified lower extremity: Secondary | ICD-10-CM | POA: Diagnosis not present

## 2015-10-10 DIAGNOSIS — I2782 Chronic pulmonary embolism: Secondary | ICD-10-CM | POA: Diagnosis not present

## 2015-10-10 DIAGNOSIS — D689 Coagulation defect, unspecified: Secondary | ICD-10-CM

## 2015-10-10 DIAGNOSIS — D688 Other specified coagulation defects: Secondary | ICD-10-CM | POA: Diagnosis not present

## 2015-10-10 DIAGNOSIS — D6859 Other primary thrombophilia: Secondary | ICD-10-CM

## 2015-10-10 DIAGNOSIS — Z7901 Long term (current) use of anticoagulants: Secondary | ICD-10-CM | POA: Diagnosis not present

## 2015-10-10 LAB — POCT INR: INR: 5

## 2015-10-10 MED ORDER — WARFARIN SODIUM 4 MG PO TABS: 4.0000 mg | ORAL_TABLET | Freq: Every day | ORAL | 3 refills | Status: DC

## 2015-10-10 MED ORDER — WARFARIN SODIUM 5 MG PO TABS: ORAL_TABLET | ORAL | 3 refills | Status: DC

## 2015-10-10 NOTE — Progress Notes (Deleted)
Anticoagulation Management Paul Ryan is a 70 y.o. male who reports to the clinic for monitoring of warfarin treatment.    Indication: protein S deficiency and h/o VTE Duration: indefinite  Anticoagulation Clinic Visit History: Patient does not report signs/symptoms of bleeding or thromboembolism. Patient denies changes in diet, medications, lifestyle. Patient was taking Lovenox before flying, but stopped that back last Friday (8/4). He took rifampin for his trip abroad, last dose was ~1-2 months ago.     Anticoagulation Episode Summary    Current INR goal:   2.0-3.0  TTR:   60.2 % (1.1 y)  Next INR check:   09/12/2015  INR from last check:   3.5! (09/07/2015)  Most recent INR:    5.0! 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  INR today: Supratherapeutic  PLAN Weekly dose was decreased by 20% to 27.5 mg per week  Patient advised to contact clinic or seek medical attention if signs/symptoms of bleeding or thromboembolism occur.  Patient verbalized understanding by repeating back information and was advised to contact me if further medication-related questions arise. Patient was also  provided an information handout.  Follow-up Patient will check INR at home in 1 week, and we will have a f/u phone call to adjust dosing for next week.    Kai Levins, PharmD  45 minutes spent face-to-face with the patient during the encounter. 20% of time spent on education. 80% of time was spent on dosing adjustments/gathering information.

## 2015-10-10 NOTE — Addendum Note (Signed)
Addended by: Forde Dandy on: 10/10/2015 11:08 AM   Modules accepted: Orders

## 2015-10-10 NOTE — Progress Notes (Signed)
Anticoagulation Management Paul Ryan is a 70 y.o. male who reports to the clinic for monitoring of warfarin treatment.    Indication: protein S deficiency and h/o VTE Duration: indefinite  Anticoagulation Clinic Visit History: Patient does not report signs/symptoms of bleeding or thromboembolism. Patient denies changes in diet, medications, lifestyle. Patient was taking Lovenox before flying, but stopped that back last Friday (8/4). He took rifampin for his trip abroad, last dose was ~1-2 months ago.  Anticoagulation Episode Summary    Current INR goal:   2.0-3.0  TTR:   60.2 % (1.1 y)  Next INR check:   10/17/2015  INR from last check:   5.0! 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       Anticoagulation Dose Instructions as of 10/10/2015      Total [name]   New Dose 27.5 mg 5 mg 5 mg 5 mg 2.5 mg 2.5 mg 5 mg 2.5 mg     (5 mg x 1)  (5 mg x 1)  (5 mg x 1)  (5 mg x 0.5)  (5 mg x 0.5)  (5 mg x 1)  (5 mg x 0.5)                         Description   Take 2.5 mg (0.5 tab) WThSat, 5 mg (1 tab) FSunMTu     INR today: Supratherapeutic  PLAN Hold 2 doses. Weekly dose was  decreased by 20% to 28 mg per week  Patient advised to contact clinic or seek medical attention if signs/symptoms of bleeding or thromboembolism occur.  Patient verbalized understanding by repeating back information and was advised to contact me if further medication-related questions arise. Patient was also provided an information handout.  Follow-up Return in about 1 week (around 10/17/2015) for Telephone visit. Patient will check INR at home with POC INR machine  Belia Heman, PharmD PGY1 Pharmacy Resident [phone] (Pager) 10/10/2015 11:05 AM  45 minutes spent face-to-face with the patient during the encounter. 20% of time spent on education. 80% of time was spent on dosing instruction/gathering information.

## 2015-10-11 ENCOUNTER — Encounter: Payer: Self-pay | Admitting: Family Medicine

## 2015-10-12 ENCOUNTER — Other Ambulatory Visit: Payer: Self-pay | Admitting: Family Medicine

## 2015-10-12 DIAGNOSIS — E785 Hyperlipidemia, unspecified: Secondary | ICD-10-CM

## 2015-10-12 DIAGNOSIS — E559 Vitamin D deficiency, unspecified: Secondary | ICD-10-CM

## 2015-10-12 DIAGNOSIS — R739 Hyperglycemia, unspecified: Secondary | ICD-10-CM

## 2015-10-12 DIAGNOSIS — M353 Polymyalgia rheumatica: Secondary | ICD-10-CM

## 2015-10-15 ENCOUNTER — Other Ambulatory Visit (INDEPENDENT_AMBULATORY_CARE_PROVIDER_SITE_OTHER): Payer: Medicare Other

## 2015-10-15 DIAGNOSIS — M353 Polymyalgia rheumatica: Secondary | ICD-10-CM

## 2015-10-15 DIAGNOSIS — E559 Vitamin D deficiency, unspecified: Secondary | ICD-10-CM

## 2015-10-15 DIAGNOSIS — R739 Hyperglycemia, unspecified: Secondary | ICD-10-CM

## 2015-10-15 DIAGNOSIS — E785 Hyperlipidemia, unspecified: Secondary | ICD-10-CM | POA: Diagnosis not present

## 2015-10-15 LAB — SEDIMENTATION RATE: Sed Rate: 8 mm/hr (ref 0–20)

## 2015-10-15 LAB — LIPID PANEL
CHOL/HDL RATIO: 6
Cholesterol: 267 mg/dL — ABNORMAL HIGH (ref 0–200)
HDL: 42.9 mg/dL (ref >39.00)
LDL CALC: 203 mg/dL — AB (ref 0–99)
NONHDL: 224.16
Triglycerides: 106 mg/dL (ref 0.0–149.0)
VLDL: 21.2 mg/dL (ref 0.0–40.0)

## 2015-10-15 LAB — CBC WITH DIFFERENTIAL/PLATELET
Basophils Absolute: 0 10*3/uL (ref 0.0–0.1)
Basophils Relative: 0.4 % (ref 0.0–3.0)
EOS PCT: 2.5 % (ref 0.0–5.0)
Eosinophils Absolute: 0.2 10*3/uL (ref 0.0–0.7)
HEMATOCRIT: 44 % (ref 39.0–52.0)
HEMOGLOBIN: 15 g/dL (ref 13.0–17.0)
LYMPHS ABS: 2 10*3/uL (ref 0.7–4.0)
LYMPHS PCT: 32 % (ref 12.0–46.0)
MCHC: 34.1 g/dL (ref 30.0–36.0)
MCV: 90.1 fl (ref 78.0–100.0)
MONOS PCT: 8.7 % (ref 3.0–12.0)
Monocytes Absolute: 0.5 10*3/uL (ref 0.1–1.0)
NEUTROS PCT: 56.4 % (ref 43.0–77.0)
Neutro Abs: 3.4 10*3/uL (ref 1.4–7.7)
Platelets: 219 10*3/uL (ref 150.0–400.0)
RBC: 4.88 Mil/uL (ref 4.22–5.81)
RDW: 14 % (ref 11.5–15.5)
WBC: 6.1 10*3/uL (ref 4.0–10.5)

## 2015-10-15 LAB — COMPREHENSIVE METABOLIC PANEL
ALK PHOS: 44 U/L (ref 39–117)
ALT: 32 U/L (ref 0–53)
AST: 27 U/L (ref 0–37)
Albumin: 4.1 g/dL (ref 3.5–5.2)
BUN: 17 mg/dL (ref 6–23)
CALCIUM: 9.6 mg/dL (ref 8.4–10.5)
CO2: 28 mEq/L (ref 19–32)
Chloride: 105 mEq/L (ref 96–112)
Creatinine, Ser: 1.14 mg/dL (ref 0.40–1.50)
GFR: 67.28 mL/min (ref >60.00)
GLUCOSE: 98 mg/dL (ref 70–99)
POTASSIUM: 4.8 meq/L (ref 3.5–5.1)
Sodium: 139 mEq/L (ref 135–145)
Total Bilirubin: 0.7 mg/dL (ref 0.2–1.2)
Total Protein: 7.3 g/dL (ref 6.0–8.3)

## 2015-10-15 LAB — PROTIME-INR

## 2015-10-15 LAB — VITAMIN D 25 HYDROXY (VIT D DEFICIENCY, FRACTURES): VITD: 71.65 ng/mL (ref 30.00–100.00)

## 2015-10-15 LAB — TSH: TSH: 0.3 u[IU]/mL — ABNORMAL LOW (ref 0.35–4.50)

## 2015-10-15 LAB — HEMOGLOBIN A1C: Hgb A1c MFr Bld: 5.9 % (ref 4.6–6.5)

## 2015-10-17 ENCOUNTER — Encounter: Payer: Self-pay | Admitting: Rheumatology

## 2015-10-18 ENCOUNTER — Telehealth (INDEPENDENT_AMBULATORY_CARE_PROVIDER_SITE_OTHER): Payer: Medicare Other | Admitting: Pharmacist

## 2015-10-18 DIAGNOSIS — D689 Coagulation defect, unspecified: Secondary | ICD-10-CM

## 2015-10-18 DIAGNOSIS — I82509 Chronic embolism and thrombosis of unspecified deep veins of unspecified lower extremity: Secondary | ICD-10-CM

## 2015-10-18 DIAGNOSIS — Z7901 Long term (current) use of anticoagulants: Secondary | ICD-10-CM | POA: Diagnosis not present

## 2015-10-18 DIAGNOSIS — I2782 Chronic pulmonary embolism: Secondary | ICD-10-CM

## 2015-10-18 DIAGNOSIS — D688 Other specified coagulation defects: Secondary | ICD-10-CM | POA: Diagnosis not present

## 2015-10-18 DIAGNOSIS — D6859 Other primary thrombophilia: Secondary | ICD-10-CM

## 2015-10-18 LAB — POCT INR: INR: 2

## 2015-10-18 NOTE — Telephone Encounter (Signed)
Anticoagulation Management Carsen Shoda is a 70 y.o. male who reports to the clinic for monitoring of warfarin treatment.    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  Anticoagulation Clinic Visit History: Patient does not report signs/symptoms of bleeding or thromboembolism   ASSESSMENT Recent Results: Lab Results  Component Value Date   INR 2.0 10/18/2015   INR 5.0 10/10/2015   INR 3.5 09/07/2015   PROTIME 15.6 (H) 05/17/2013   Anticoagulation Dosing: INR as of 10/18/2015 and Previous Dosing Information    INR Dt INR Goal [name] [name]   10/18/2015 2.0 2.0-3.0 28 mg 4 mg 4 mg 4 mg 4 mg 4 mg 4 mg 4 mg    Previous description   HELD X 2 DOSES ON 10/10/15-10/11/15   Anticoagulation Dose Instructions as of 10/18/2015      Total [name]   New Dose 28 mg 4 mg 4 mg 4 mg 4 mg 4 mg 4 mg 4 mg     (4 mg x 1)  (4 mg x 1)  (4 mg x 1)  (4 mg x 1)  (4 mg x 1)  (4 mg x 1)  (4 mg x 1)                         Description        INR today: Therapeutic  PLAN Continue taking 4 mg daily (28 mg/week)  Patient advised to contact clinic or seek medical attention if signs/symptoms of bleeding or thromboembolism occur.  Patient verbalized understanding by repeating back information and was advised to contact me if further medication-related questions arise. Patient was also provided an information handout.  Follow-up 1 week  [name],[name]

## 2015-10-19 ENCOUNTER — Encounter: Payer: Self-pay | Admitting: Student-PharmD

## 2015-10-19 LAB — PROTIME-INR

## 2015-10-22 ENCOUNTER — Other Ambulatory Visit: Payer: Self-pay

## 2015-10-22 ENCOUNTER — Encounter: Payer: Self-pay | Admitting: Family Medicine

## 2015-10-22 ENCOUNTER — Ambulatory Visit (INDEPENDENT_AMBULATORY_CARE_PROVIDER_SITE_OTHER): Payer: Medicare Other | Admitting: Family Medicine

## 2015-10-22 VITALS — BP 118/72 | HR 93 | Temp 98.0°F | Ht 71.0 in | Wt 161.2 lb

## 2015-10-22 DIAGNOSIS — D688 Other specified coagulation defects: Secondary | ICD-10-CM

## 2015-10-22 DIAGNOSIS — E559 Vitamin D deficiency, unspecified: Secondary | ICD-10-CM

## 2015-10-22 DIAGNOSIS — Z0000 Encounter for general adult medical examination without abnormal findings: Secondary | ICD-10-CM | POA: Diagnosis not present

## 2015-10-22 DIAGNOSIS — E785 Hyperlipidemia, unspecified: Secondary | ICD-10-CM

## 2015-10-22 DIAGNOSIS — R739 Hyperglycemia, unspecified: Secondary | ICD-10-CM | POA: Diagnosis not present

## 2015-10-22 DIAGNOSIS — R7612 Nonspecific reaction to cell mediated immunity measurement of gamma interferon antigen response without active tuberculosis: Secondary | ICD-10-CM

## 2015-10-22 DIAGNOSIS — E039 Hypothyroidism, unspecified: Secondary | ICD-10-CM

## 2015-10-22 DIAGNOSIS — D689 Coagulation defect, unspecified: Secondary | ICD-10-CM

## 2015-10-22 DIAGNOSIS — M353 Polymyalgia rheumatica: Secondary | ICD-10-CM

## 2015-10-22 DIAGNOSIS — D6859 Other primary thrombophilia: Secondary | ICD-10-CM

## 2015-10-22 DIAGNOSIS — I82509 Chronic embolism and thrombosis of unspecified deep veins of unspecified lower extremity: Secondary | ICD-10-CM

## 2015-10-22 DIAGNOSIS — E038 Other specified hypothyroidism: Secondary | ICD-10-CM

## 2015-10-22 MED ORDER — THYROID 65 MG PO TABS: ORAL_TABLET | ORAL | 1 refills | Status: DC

## 2015-10-22 MED ORDER — THYROID 97.5 MG PO TABS: ORAL_TABLET | ORAL | 1 refills | Status: DC

## 2015-10-22 NOTE — Progress Notes (Signed)
Pre visit review using our clinic review tool, if applicable. No additional management support is needed unless otherwise documented below in the visit note. 

## 2015-10-22 NOTE — Patient Instructions (Signed)

## 2015-10-22 NOTE — Telephone Encounter (Signed)
Pt has a CPE scheduled for today.  Future labs ordered for visit today.

## 2015-10-30 ENCOUNTER — Ambulatory Visit (INDEPENDENT_AMBULATORY_CARE_PROVIDER_SITE_OTHER): Payer: Medicare Other | Admitting: Pharmacist

## 2015-10-30 DIAGNOSIS — D688 Other specified coagulation defects: Secondary | ICD-10-CM | POA: Diagnosis not present

## 2015-10-30 DIAGNOSIS — I82409 Acute embolism and thrombosis of unspecified deep veins of unspecified lower extremity: Secondary | ICD-10-CM

## 2015-10-30 DIAGNOSIS — I2699 Other pulmonary embolism without acute cor pulmonale: Secondary | ICD-10-CM | POA: Diagnosis not present

## 2015-10-30 DIAGNOSIS — Z7901 Long term (current) use of anticoagulants: Secondary | ICD-10-CM | POA: Diagnosis not present

## 2015-10-30 DIAGNOSIS — D6859 Other primary thrombophilia: Secondary | ICD-10-CM

## 2015-10-31 LAB — POCT INR: INR: 3.1

## 2015-10-31 LAB — PROTIME-INR

## 2015-10-31 NOTE — Assessment & Plan Note (Signed)
Asymptomatic, treatment completed

## 2015-10-31 NOTE — Assessment & Plan Note (Signed)
tolerating coumadin

## 2015-10-31 NOTE — Progress Notes (Signed)
Anticoagulation Management Paul Ryan is a 70 y.o. male who reports to the clinic for monitoring of warfarin treatment.    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  Anticoagulation Clinic Visit History: Patient does not report signs/symptoms of bleeding or thromboembolism   Anticoagulation Episode Summary    Current INR goal:   2.0-3.0  TTR:   60.7 % (1.2 y)  Next INR check:   10/24/2015  INR from last check:   2.0 (10/18/2015)  Most recent INR:    3.1! 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]       Comments:   [id number]       ASSESSMENT Recent Results: The most recent result is correlated with 28 mg per week: Lab Results  Component Value Date   INR 3.1 10/31/2015   INR 2.0 10/18/2015   INR 5.0 10/10/2015   PROTIME 15.6 (H) 05/17/2013   Anticoagulation Dosing: INR as of 10/18/2015 and Previous Dosing Information    INR Dt INR Goal [name] [name]   10/18/2015 2.0 2.0-3.0 28 mg 4 mg 4 mg 4 mg 4 mg 4 mg 4 mg 4 mg    Previous description   Hold x 2 doses   Anticoagulation Dose Instructions as of 10/18/2015      Total [name]   New Dose 28 mg 4 mg 4 mg 4 mg 4 mg 4 mg 4 mg 4 mg     (4 mg x 1)  (4 mg x 1)  (4 mg x 1)  (4 mg x 1)  (4 mg x 1)  (4 mg x 1)  (4 mg x 1)                         Description   Hold x 2 doses     INR today: Therapeutic  PLAN Weekly dose was unchanged. Patient advised to contact clinic or seek medical attention if signs/symptoms of bleeding or thromboembolism occur. Patient was also educated about dyslipidemia and benefits of statin therapy (patient states he prefers not to take a  statin and would rather try red yeast rice for now). Will re-visit with patient in the future.  Patient verbalized understanding by repeating back information and was advised to contact me if further medication-related questions arise. Patient was also provided an information handout.  Follow-up 1 week  [name],[name]  15 minutes spent face-to-face with the patient during the encounter. 50% of time spent on education. 50% of time was spent on assessment and plan.

## 2015-10-31 NOTE — Assessment & Plan Note (Signed)
Patient encouraged to maintain heart healthy diet, regular exercise, adequate sleep. Consider daily probiotics. Take medications as prescribed. Given and reviewed copy of ACP documents from [employer] and encouraged to complete and return. He is considering moving out of state but not for at least several months.

## 2015-10-31 NOTE — Progress Notes (Signed)
Reviewed Thanks DrG 

## 2015-10-31 NOTE — Progress Notes (Signed)
Patient ID: Paul Ryan, male   DOB: 09-Mar-1945, 70 y.o.   MRN: [id number]   Subjective:    Patient ID: Paul Ryan, male    DOB: 03-19-1945, 70 y.o.   MRN: [id number]  Chief Complaint  Patient presents with  . Annual Exam    HPI Patient is in today for annual exam and follow up on numerous medical concerns including hyperlipidemia, hypothyroidism, coagulation defect, hyperglycemia and pain. His diagnosis of PMR is being questioned by rheumatology and he is feeling better regarding pain. Is off of steroids. No recent hospitalization or acute concerns. Continues to struggle with fatigue and frustration regarding his health concerns this year. Is eating well and staying active. Denies CP/palp/SOB/HA/congestion/fevers/GI or GU c/o. Taking meds as prescribed  Past Medical History:  Diagnosis Date  . Allergy    seasonal  . Chronic anticoagulation 06/14/2013  . Coagulopathy ([place]) 05/16/2011  . Collagen vascular disease ([place])   . DVT (deep venous thrombosis) ([name])    takes coumadin  . Dysuria 02/27/2013  . 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 Hyperglycemia 12/26/2014  . Hyperlipidemia   . Hypothyroid 06/10/2011  . Left hip pain 12/17/2010  . Polymyalgia ([place]) 03/21/2014  . 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 Preventative health care 12/17/2010  . Shingles 1982   right abdominal wall  . Thyroid nodule 11/29/2014  . Vitamin D deficiency 10/14/2011    Past Surgical History:  Procedure Laterality Date  . 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 Cancer Mother     unknown- everywhere  . Cancer Sister     breast    Social History   Social History  . Marital status: Married    Spouse name: N/A  . Number of children: N/A  . Years of education: N/A   Occupational History  . Not on file.   Social History Main Topics  . Smoking status: Former Smoker    Packs/day: 1.00    Years: 20.00    Types:  Cigarettes    Quit date: 03/03/1980  . Smokeless tobacco: Never Used  . Alcohol use No  . Drug use: No  . Sexual activity: Not on file   Other Topics Concern  . Not on file   Social History Narrative  . No narrative on file    Outpatient Medications Prior to Visit  Medication Sig Dispense Refill  . albuterol (PROVENTIL HFA;VENTOLIN HFA) 108 (90 Base) MCG/ACT inhaler Inhale 2 puffs into the lungs every 4 (four) hours as needed for wheezing or shortness of breath. 1 Inhaler 3  . b complex vitamins tablet Take 1 tablet by mouth daily.      . Cholecalciferol 4000 UNITS TABS Take 1 tablet by mouth daily.    . Coenzyme Q10 (COQ10 PO) Take 2 tablets by mouth daily.    Javier Docker Oil 300 MG CAPS 1 cap daily    . NIACIN PO Take 1 tablet by mouth 3 (three) times daily.     . rosuvastatin (CRESTOR) 20 MG tablet TAKE 1 TABLET (20 MG TOTAL) BY MOUTH AT BEDTIME. 90 tablet 1  . tacrolimus (PROTOPIC) 0.1 % ointment Apply topically 2 (two) times daily. 100 g 1  . warfarin (COUMADIN) 4 MG tablet Take 1 tablet (4 mg total) by mouth daily. 30 tablet 3  . ranitidine (ZANTAC) 300 MG tablet Take 1 tablet (300 mg total) by mouth at  bedtime. 30 tablet 2  . thyroid (NATURE-THROID) 65 MG tablet Take 1 tablet (65 mg total) by mouth daily. 90 tablet 1  . Thyroid (NATURE-THROID) 97.5 MG TABS Take 1 tablet (97.5 mg total) by mouth daily. 90 tablet 1  . montelukast (SINGULAIR) 10 MG tablet Take 1 tablet (10 mg total) by mouth at bedtime. 90 tablet 3  . ondansetron (ZOFRAN) 4 MG tablet Take 1 tablet (4 mg total) by mouth every 8 (eight) hours as needed for nausea or vomiting. 40 tablet 1  . predniSONE (DELTASONE) 10 MG tablet Take 1 tablet (10 mg total) by mouth daily with breakfast. 30 tablet 0   No facility-administered medications prior to visit.     No Known Allergies  Review of Systems  Constitutional: Positive for malaise/fatigue. Negative for fever.  HENT: Negative for congestion.   Eyes: Negative for  blurred vision.  Respiratory: Negative for shortness of breath.   Cardiovascular: Negative for chest pain, palpitations and leg swelling.  Gastrointestinal: Negative for abdominal pain, blood in stool and nausea.  Genitourinary: Negative for dysuria and frequency.  Musculoskeletal: Negative for falls.  Skin: Negative for rash.  Neurological: Negative for dizziness, loss of consciousness and headaches.  Endo/Heme/Allergies: Negative for environmental allergies.  Psychiatric/Behavioral: Negative for depression. The patient is not nervous/anxious.        Objective:    Physical Exam  Constitutional: He is oriented to person, place, and time. He appears well-developed and well-nourished. No distress.  HENT:  Head: Normocephalic and atraumatic.  Eyes: Conjunctivae are normal.  Neck: Neck supple. No thyromegaly present.  Cardiovascular: Normal rate, regular rhythm and normal heart sounds.   No murmur heard. Pulmonary/Chest: Effort normal and breath sounds normal. No respiratory distress. He has no wheezes.  Abdominal: Soft. Bowel sounds are normal. He exhibits no mass. There is no tenderness.  Musculoskeletal: He exhibits no edema.  Lymphadenopathy:    He has no cervical adenopathy.  Neurological: He is alert and oriented to person, place, and time.  Skin: Skin is warm and dry.  Psychiatric: He has a normal mood and affect. His behavior is normal.    BP 118/72 (BP Location: Left Arm, Patient Position: Sitting, Cuff Size: Normal)   Pulse 93   Temp 98 F (36.7 C) (Oral)   Ht 5\' 11"  (1.803 m)   Wt 161 lb 4 oz (73.1 kg)   SpO2 97%   BMI 22.49 kg/m  Wt Readings from Last 3 Encounters:  10/22/15 161 lb 4 oz (73.1 kg)  04/06/15 167 lb 8 oz (76 kg)  03/23/15 165 lb 8 oz (75.1 kg)     Lab Results  Component Value Date   WBC 6.1 10/15/2015   HGB 15.0 10/15/2015   HCT 44.0 10/15/2015   PLT 219.0 10/15/2015   GLUCOSE 98 10/15/2015   CHOL 267 (H) 10/15/2015   TRIG 106.0  10/15/2015   HDL 42.90 10/15/2015   LDLDIRECT 158.0 03/11/2013   LDLCALC 203 (H) 10/15/2015   ALT 32 10/15/2015   AST 27 10/15/2015   NA 139 10/15/2015   K 4.8 10/15/2015   CL 105 10/15/2015   CREATININE 1.14 10/15/2015   BUN 17 10/15/2015   CO2 28 10/15/2015   TSH 0.30 (L) 10/15/2015   PSA 1.63 10/14/2011   INR 2.0 10/18/2015   HGBA1C 5.9 10/15/2015    Lab Results  Component Value Date   TSH 0.30 (L) 10/15/2015   Lab Results  Component Value Date   WBC 6.1 10/15/2015  HGB 15.0 10/15/2015   HCT 44.0 10/15/2015   MCV 90.1 10/15/2015   PLT 219.0 10/15/2015   Lab Results  Component Value Date   NA 139 10/15/2015   K 4.8 10/15/2015   CHLORIDE 109 11/16/2012   CO2 28 10/15/2015   GLUCOSE 98 10/15/2015   BUN 17 10/15/2015   CREATININE 1.14 10/15/2015   BILITOT 0.7 10/15/2015   ALKPHOS 44 10/15/2015   AST 27 10/15/2015   ALT 32 10/15/2015   PROT 7.3 10/15/2015   ALBUMIN 4.1 10/15/2015   CALCIUM 9.6 10/15/2015   ANIONGAP 8 09/11/2014   GFR 67.28 10/15/2015   Lab Results  Component Value Date   CHOL 267 (H) 10/15/2015   Lab Results  Component Value Date   HDL 42.90 10/15/2015   Lab Results  Component Value Date   LDLCALC 203 (H) 10/15/2015   Lab Results  Component Value Date   TRIG 106.0 10/15/2015   Lab Results  Component Value Date   CHOLHDL 6 10/15/2015   Lab Results  Component Value Date   HGBA1C 5.9 10/15/2015       Assessment & Plan:   Problem List Items Addressed This Visit    DVT (deep venous thrombosis) ([name])    tolerating coumadin      Hyperlipidemia    Tolerating statin, encouraged heart healthy diet, avoid trans fats, minimize simple carbs and saturated fats. Increase exercise as tolerated      Relevant Orders   Lipid panel   Preventative health care    Patient encouraged to maintain heart healthy diet, regular exercise, adequate sleep. Consider daily probiotics. Take medications as prescribed. Given and reviewed copy of ACP  documents from [employer] and encouraged to complete and return. He is considering moving out of state but not for at least several months.      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 His TSH is mildly suppressed but he is asymptomatic. He is aware his current supplement is not as well regulated as Levothyroxine but chooses to continue. He will report any concerning symptoms and we will recheck labs in 12 weeks.      Relevant Medications   thyroid (NATURE-THROID) 65 MG tablet   Thyroid (NATURE-THROID) 97.5 MG TABS   Other Relevant Orders   TSH   T4, free   Vitamin D deficiency - Primary    Vitamin D level is wnl. Continue daily supplements      Polymyalgia rheumatica ([name])    Continues to feel better s/p course of steroids and this diagnosis is being called into question. Will continue to follow along with rheumatology.      Hyperglycemia   Relevant Orders   Hemoglobin A1c   Comprehensive metabolic panel   Positive QuantiFERON-TB Gold test    Asymptomatic, treatment completed       Other Visit Diagnoses   None.     I have discontinued Mr. Weckwerth predniSONE, montelukast, ondansetron, and ranitidine. I have also changed his thyroid and Thyroid. Additionally, I am having him maintain his b complex vitamins, Krill Oil, tacrolimus, NIACIN PO, Cholecalciferol, Coenzyme Q10 (COQ10 PO), albuterol, rosuvastatin, warfarin, and famotidine.  Meds ordered this encounter  Medications  . famotidine (PEPCID) 40 MG tablet    Sig: Take 40 mg by mouth daily.  [name] Kitchen thyroid (NATURE-THROID) 65 MG tablet  Sig: 1 tab po M, W, F, Sat    Dispense:  90 tablet    Refill:  1  . Thyroid (NATURE-THROID) 97.5 MG TABS    Sig: 1 tab po T, Th, Sun    Dispense:  90 tablet    Refill:  1     Penni Homans, MD

## 2015-10-31 NOTE — Assessment & Plan Note (Signed)
Tolerating statin, encouraged heart healthy diet, avoid trans fats, minimize simple carbs and saturated fats. Increase exercise as tolerated 

## 2015-10-31 NOTE — Assessment & Plan Note (Signed)
Continues to feel better s/p course of steroids and this diagnosis is being called into question. Will continue to follow along with rheumatology.

## 2015-10-31 NOTE — Assessment & Plan Note (Signed)
Vitamin D level is wnl. Continue daily supplements

## 2015-10-31 NOTE — Assessment & Plan Note (Signed)
Patient feeling well on nature throid. His TSH is mildly suppressed but he is asymptomatic. He is aware his current supplement is not as well regulated as Levothyroxine but chooses to continue. He will report any concerning symptoms and we will recheck labs in 12 weeks.

## 2015-11-01 ENCOUNTER — Ambulatory Visit: Payer: Medicare Other | Admitting: Pharmacist

## 2015-11-02 ENCOUNTER — Other Ambulatory Visit: Payer: Self-pay | Admitting: Family Medicine

## 2015-11-05 ENCOUNTER — Encounter (HOSPITAL_BASED_OUTPATIENT_CLINIC_OR_DEPARTMENT_OTHER): Payer: Self-pay | Admitting: Emergency Medicine

## 2015-11-05 ENCOUNTER — Emergency Department (HOSPITAL_BASED_OUTPATIENT_CLINIC_OR_DEPARTMENT_OTHER): Payer: Medicare Other

## 2015-11-05 ENCOUNTER — Emergency Department (HOSPITAL_BASED_OUTPATIENT_CLINIC_OR_DEPARTMENT_OTHER)
Admission: EM | Admit: 2015-11-05 | Discharge: 2015-11-05 | Disposition: A | Discharge status: Home / Self Care | Payer: Medicare Other | Attending: Emergency Medicine | Admitting: Emergency Medicine

## 2015-11-05 DIAGNOSIS — Z7901 Long term (current) use of anticoagulants: Secondary | ICD-10-CM | POA: Diagnosis not present

## 2015-11-05 DIAGNOSIS — S6991XA Unspecified injury of right wrist, hand and finger(s), initial encounter: Secondary | ICD-10-CM | POA: Insufficient documentation

## 2015-11-05 DIAGNOSIS — E039 Hypothyroidism, unspecified: Secondary | ICD-10-CM | POA: Diagnosis not present

## 2015-11-05 DIAGNOSIS — W228XXA Striking against or struck by other objects, initial encounter: Secondary | ICD-10-CM | POA: Insufficient documentation

## 2015-11-05 DIAGNOSIS — Y939 Activity, unspecified: Secondary | ICD-10-CM | POA: Diagnosis not present

## 2015-11-05 DIAGNOSIS — Y929 Unspecified place or not applicable: Secondary | ICD-10-CM | POA: Diagnosis not present

## 2015-11-05 DIAGNOSIS — Z87891 Personal history of nicotine dependence: Secondary | ICD-10-CM | POA: Insufficient documentation

## 2015-11-05 DIAGNOSIS — Y999 Unspecified external cause status: Secondary | ICD-10-CM | POA: Diagnosis not present

## 2015-11-05 LAB — PROTIME-INR
INR: 3.05
PROTHROMBIN TIME: 32.2 s — AB (ref 11.4–15.2)

## 2015-11-05 NOTE — ED Triage Notes (Signed)
Patient states that he hit his right hand yesterday with some hedge clippers. The patient reports that he is on blood thinners and is concerned about the swelling

## 2015-11-05 NOTE — Discharge Instructions (Signed)
There were no abnormalities on x-ray. Activity with the right hand as tolerated. Your INR was 3.05 today. You will not need to get this checked for another week. Follow-up with your PCP as needed. Tylenol as needed for pain.

## 2015-11-05 NOTE — ED Provider Notes (Signed)
[facility] DEPT Provider Note   CSN: [id number] Arrival date & time: 11/05/15  1044     History   Chief Complaint Chief Complaint  Patient presents with  . Hand Injury    HPI Paul Ryan is a 70 y.o. male.  HPI   Paul Ryan is a 70 y.o. male, with a history of DVT and hereditary protein S deficiency, presenting to the ED with Right hand swelling that began this morning. Patient states he struck his hand with the wooden handle of a garden tool yesterday. Patient has minor throbbing pain in the right hand. Swelling is mostly noted to the right third and fourth digits. Denies numbness, weakness, other injuries, or any other complaints.   Last INR was 3.1 on Aug 31. Patient is on Coumadin due to his predisposition for DVT.   Past Medical History:  Diagnosis Date  . Allergy    seasonal  . Chronic anticoagulation 06/14/2013  . Coagulopathy ([place]) 05/16/2011  . Collagen vascular disease ([place])   . DVT (deep venous thrombosis) ([name])    takes coumadin  . Dysuria 02/27/2013  . 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 Hyperglycemia 12/26/2014  . Hyperlipidemia   . Hypothyroid 06/10/2011  . Left hip pain 12/17/2010  . Polymyalgia ([place]) 03/21/2014  . 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 Preventative health care 12/17/2010  . Shingles 1982   right abdominal wall  . Thyroid nodule 11/29/2014  . Vitamin D deficiency 10/14/2011    Patient Active Problem List   Diagnosis Date Noted  . 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 Upper airway cough syndrome 12/31/2014  . Dyspnea 12/29/2014  . Hyperglycemia 12/26/2014  . Pulmonary embolism ([place]) 08/07/2014  . Medicare annual wellness visit, subsequent 05/03/2014  . Polymyalgia rheumatica ([place]) 04/25/2014  . Chronic anticoagulation 06/14/2013  . Leukocytosis, unspecified 05/29/2013  . Greater trochanteric bursitis of right hip 05/16/2013  . Hip pain, right 05/01/2013  . Vitamin D deficiency 10/14/2011  .  Hypothyroid 06/10/2011  . Coagulopathy ([place]) 05/16/2011  . 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 Left hip pain 12/17/2010  . Preventative health care 12/17/2010  . DVT (deep venous thrombosis) ([place])   . Allergic state   . Hyperlipidemia     Past Surgical History:  Procedure Laterality Date  . 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 Authorizing Provider  albuterol (PROVENTIL HFA;VENTOLIN HFA) 108 (90 Base) MCG/ACT inhaler Inhale 2 puffs into the lungs every 4 (four) hours as needed for wheezing or shortness of breath. 05/03/15   [name], MD  b complex vitamins tablet Take 1 tablet by mouth daily.      Historical Provider, MD  Cholecalciferol 4000 UNITS TABS Take 1 tablet by mouth daily.    Historical Provider, MD  Coenzyme Q10 (COQ10 PO) Take 2 tablets by mouth daily.    Historical Provider, MD  famotidine (PEPCID) 40 MG tablet Take 40 mg by mouth daily.    Historical Provider, MD  Astrid Drafts 300 MG CAPS 1 cap daily 10/12/12   [name], MD  NATURE-THROID 97.5 MG TABS TAKE 1 TABLET (97.5 MG TOTAL) BY MOUTH DAILY. 11/02/15   [name], MD  NIACIN PO Take 1 tablet by mouth 3 (three) times daily.     Historical Provider, MD  rosuvastatin (CRESTOR)  20 MG tablet TAKE 1 TABLET (20 MG TOTAL) BY MOUTH AT BEDTIME. 09/06/15   [name], MD  tacrolimus (PROTOPIC) 0.1 % ointment Apply topically 2 (two) times daily. 02/22/13   [name], MD  thyroid (NATURE-THROID) 65 MG tablet 1 tab po M, W, F, Sat 10/22/15   [name], MD  Thyroid (NATURE-THROID) 97.5 MG TABS 1 tab po T, Th, Sun 10/22/15   [name], MD  warfarin (COUMADIN) 4 MG tablet Take 1 tablet (4 mg total) by mouth daily. 10/10/15   Annia Belt, MD    Family History Family History  Problem Relation Age of Onset  . Cancer Mother     unknown- everywhere  . Cancer Sister       breast    Social History Social History  Substance Use Topics  . Smoking status: Former Smoker    Packs/day: 1.00    Years: 20.00    Types: Cigarettes    Quit date: 03/03/1980  . Smokeless tobacco: Never Used  . Alcohol use No     Allergies   Review of patient's allergies indicates no known allergies.   Review of Systems Review of Systems  Constitutional: Negative for chills and fever.  Musculoskeletal: Positive for arthralgias and joint swelling.  Neurological: Negative for weakness and numbness.     Physical Exam Updated Vital Signs BP 111/64 (BP Location: Left Arm)   Pulse (!) 55   Temp 97.7 F (36.5 C) (Oral)   Resp 18   Ht 5\' 11"  (1.803 m)   Wt 74.4 kg   SpO2 100%   BMI 22.87 kg/m   Physical Exam  Constitutional: He appears well-developed and well-nourished. No distress.  HENT:  Head: Normocephalic and atraumatic.  Eyes: Conjunctivae are normal.  Neck: Neck supple.  Cardiovascular: Normal rate and regular rhythm.   Pulmonary/Chest: Effort normal.  Musculoskeletal: Normal range of motion. He exhibits edema.  Minor swelling noted to the right third and fourth digits of the hand. Full range of motion in the patient's right hand and fingers.  Neurological: He is alert.  No sensory deficits in the hands. Strength is 5 out of 5 bilaterally.  Skin: Skin is warm and dry. Capillary refill takes less than 2 seconds. He is not diaphoretic.  Psychiatric: He has a normal mood and affect. His behavior is normal.  Nursing note and vitals reviewed.    ED Treatments / Results  Labs (all labs ordered are listed, but only abnormal results are displayed) Labs Reviewed  PROTIME-INR - Abnormal; Notable for the following:       Result Value   Prothrombin Time 32.2 (*)    All other components within normal limits    EKG  EKG Interpretation None       Radiology Dg Hand Complete Right  Result Date: 11/05/2015 CLINICAL DATA:  70 year old male with a history  of injury and swelling. EXAM: RIGHT HAND - COMPLETE 3+ VIEW COMPARISON:  None. FINDINGS: No acute fracture identified. No focal soft tissue swelling. Mild degenerative changes of the interphalangeal joints. Joints are congruent. No radiopaque foreign body. IMPRESSION: Negative for acute bony abnormality. Signed, Dulcy Fanny. Earleen Newport, DO [facility] Radiology Electronically Signed   By: Corrie Mckusick D.O.   On: 11/05/2015 11:18    Procedures Procedures (including critical care time)  Medications Ordered in ED Medications - No data to display   Initial Impression / Assessment and Plan / ED Course  I  have reviewed the triage vital signs and the nursing notes.  Pertinent labs & imaging results that were available during my care of the patient were reviewed by me and considered in my medical decision making (see chart for details).  Clinical Course    Patient with right hand swelling following an injury sustained yesterday. No neuro or functional deficits. No abnormalities on x-ray. INR slightly above therapeutic. Patient advised to follow-up with his PCP on this matter.  Findings and plan of care discussed with Blanchie Dessert, MD. Dr. Maryan Rued personally evaluated and examined this patient.     Final Clinical Impressions(s) / ED Diagnoses   Final diagnoses:  Hand injury, right, initial encounter    New Prescriptions Discharge Medication List as of 11/05/2015 12:05 PM       Lorayne Bender, PA-C 11/06/15 [place], MD 11/06/15 1540

## 2015-11-06 LAB — PROTIME-INR: INR: 2.5

## 2015-11-08 ENCOUNTER — Telehealth (INDEPENDENT_AMBULATORY_CARE_PROVIDER_SITE_OTHER): Payer: Medicare Other | Admitting: Pharmacist

## 2015-11-08 DIAGNOSIS — I2782 Chronic pulmonary embolism: Secondary | ICD-10-CM

## 2015-11-08 DIAGNOSIS — D688 Other specified coagulation defects: Secondary | ICD-10-CM | POA: Diagnosis not present

## 2015-11-08 DIAGNOSIS — I82509 Chronic embolism and thrombosis of unspecified deep veins of unspecified lower extremity: Secondary | ICD-10-CM | POA: Diagnosis not present

## 2015-11-08 DIAGNOSIS — Z7901 Long term (current) use of anticoagulants: Secondary | ICD-10-CM

## 2015-11-08 DIAGNOSIS — D689 Coagulation defect, unspecified: Secondary | ICD-10-CM

## 2015-11-08 DIAGNOSIS — D6859 Other primary thrombophilia: Secondary | ICD-10-CM

## 2015-11-08 LAB — POCT INR: INR: 2.6

## 2015-11-08 NOTE — Progress Notes (Signed)
Anticoagulation Management Paul Ryan is a 70 y.o. male who reports to the clinic for monitoring of warfarin treatment.    Indication: DVT, PE and protein S deficiency  Duration: indefinite  Anticoagulation Clinic Visit History: Patient does not report signs/symptoms of bleeding or thromboembolism, patient uses home meter through [employer].  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[place]) [[name]]       Comments:   DR [id number]       ASSESSMENT Recent Results: The most recent result is correlated with 28 mg per week: Lab Results  Component Value Date   INR 2.6 11/08/2015   INR 3.05 11/05/2015   INR 3.1 10/31/2015   PROTIME 15.6 (H) 05/17/2013   Anticoagulation Dosing: INR as of 11/08/2015 and Previous Dosing Information    INR Dt INR Goal Wkly Tot [name]     2.0-3.0 28 mg 4 mg 4 mg 4 mg 4 mg 4 mg 4 mg 4 mg    Previous description   Hold x 2 doses   Anticoagulation Dose Instructions as of 11/08/2015      Total [name]   New Dose 28 mg 4 mg 4 mg 4 mg 4 mg 4 mg 4 mg 4 mg     (4 mg x 1)  (4 mg x 1)  (4 mg x 1)  (4 mg x 1)  (4 mg x 1)  (4 mg x 1)  (4 mg x 1)                           INR today: Therapeutic  PLAN Weekly dose was unchanged  Patient advised to contact clinic or seek medical attention if signs/symptoms of bleeding or thromboembolism occur.  Patient verbalized understanding by repeating back information and was advised to contact me if further medication-related questions arise. Patient was also provided an  information handout.  Follow-up 1 week  [name],[name]

## 2015-11-13 ENCOUNTER — Telehealth (INDEPENDENT_AMBULATORY_CARE_PROVIDER_SITE_OTHER): Payer: Medicare Other | Admitting: Pharmacist

## 2015-11-13 DIAGNOSIS — I82509 Chronic embolism and thrombosis of unspecified deep veins of unspecified lower extremity: Secondary | ICD-10-CM

## 2015-11-13 DIAGNOSIS — Z7901 Long term (current) use of anticoagulants: Secondary | ICD-10-CM | POA: Diagnosis not present

## 2015-11-13 DIAGNOSIS — D688 Other specified coagulation defects: Secondary | ICD-10-CM

## 2015-11-13 DIAGNOSIS — I2782 Chronic pulmonary embolism: Secondary | ICD-10-CM

## 2015-11-13 DIAGNOSIS — D6859 Other primary thrombophilia: Secondary | ICD-10-CM

## 2015-11-13 DIAGNOSIS — D689 Coagulation defect, unspecified: Secondary | ICD-10-CM

## 2015-11-14 LAB — POCT INR: INR: 3.5

## 2015-11-14 NOTE — Progress Notes (Signed)
Anticoagulation Management Paul Ryan is a 70 y.o. male who reports to the clinic for monitoring of warfarin treatment.    Indication: history of DVT, PE and protein S deficiency Duration: indefinite  Anticoagulation Clinic Visit History: Patient does not report signs/symptoms of bleeding or thromboembolism. Of note, patient started taking Wobenzyme supplement for pain which includes papaya extract and may increase warfarin effects  Anticoagulation Episode Summary    Current INR goal:   2.0-3.0  TTR:   60.0 % (1.2 y)  Next INR check:   11/20/2015  INR from last check:   2.6 (11/08/2015)  Most recent INR:    3.5! 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[place]) [[name]]       Comments:   DR [id number]       ASSESSMENT Recent Results: Lab Results  Component Value Date   INR 3.5 11/14/2015   INR 2.6 11/08/2015   INR 3.05 11/05/2015   PROTIME 15.6 (H) 05/17/2013   Anticoagulation Dosing: INR as of 11/13/2015 and Previous Dosing Information    INR Dt INR Goal Wkly Tot [name]   11/08/2015 2.6 2.0-3.0 28 mg 4 mg 4 mg 4 mg 4 mg 4 mg 4 mg 4 mg    Anticoagulation Dose Instructions as of 11/13/2015      Total [name]   New Dose 24 mg 4 mg 4 mg 2 mg 4 mg 2 mg 4 mg 4 mg     (4 mg x 1)  (4 mg x 1)  (4 mg x 0.5)  (4 mg x 1)  (4 mg x 0.5)  (4 mg x 1)  (4 mg x 1)                           INR today: Supratherapeutic  PLAN Weekly dose was decreased by 14% to 24 mg per week  Patient advised to contact clinic or seek medical attention if signs/symptoms of bleeding or thromboembolism occur.  Patient verbalized understanding by repeating back information and was advised to  contact me if further medication-related questions arise. Patient was also provided an information handout.  Follow-up 1 week  [name],[name]

## 2015-11-14 NOTE — Telephone Encounter (Signed)
Reviewed  Thanks. Given wide fluctuations, this INR OK & I would continue current dose DrG

## 2015-11-20 ENCOUNTER — Ambulatory Visit: Payer: Medicare Other | Admitting: Pharmacist

## 2015-11-20 DIAGNOSIS — I2782 Chronic pulmonary embolism: Secondary | ICD-10-CM

## 2015-11-20 DIAGNOSIS — D6859 Other primary thrombophilia: Secondary | ICD-10-CM

## 2015-11-20 DIAGNOSIS — D689 Coagulation defect, unspecified: Secondary | ICD-10-CM

## 2015-11-20 DIAGNOSIS — Z7901 Long term (current) use of anticoagulants: Secondary | ICD-10-CM

## 2015-11-20 DIAGNOSIS — I82509 Chronic embolism and thrombosis of unspecified deep veins of unspecified lower extremity: Secondary | ICD-10-CM

## 2015-11-20 NOTE — Progress Notes (Signed)
Anticoagulation Management Paul Ryan is a 70 y.o. male who reports to the clinic for monitoring of warfarin treatment.    Indication: protein S deficiencyand h/o VTE Duration: indefinite  Anticoagulation Clinic Visit History: Patient does not report signs/symptoms of bleeding or thromboembolism  No recent changes in diet, medications, lifestyle Anticoagulation Episode Summary    Current INR goal:   2.0-3.0  TTR:   60.0 % (1.2 y)  Next INR check:   11/20/2015  INR from last check:   2.6 (11/08/2015)  Most recent INR:    3.5! 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]       Comments:   DR [id number]       ASSESSMENT Recent Results: The most recent result is correlated with 24 mg per week: Lab Results  Component Value Date   INR 3.5 11/14/2015   INR 2.6 11/08/2015   INR 3.05 11/05/2015   PROTIME 15.6 (H) 05/17/2013    Anticoagulation Dosing: INR as of 11/13/2015 and Previous Dosing Information    INR Dt INR Goal [name] [name]   11/08/2015 2.6 2.0-3.0 28 mg 4 mg 4 mg 4 mg 4 mg 4 mg 4 mg 4 mg    Anticoagulation Dose Instructions as of 11/13/2015      Total [name]   New Dose 24 mg 4 mg 4 mg 2 mg 4 mg 2 mg 4 mg 4 mg     (4 mg x 1)  (4 mg x 1)  (4 mg x 0.5)  (4 mg x 1)  (4 mg x 0.5)  (4 mg x 1)  (4 mg x 1)                           INR today: Therapeutic  PLAN Weekly dose was unchanged. Continue current regimen (4 mg every day except 2 mg on T/Th). Pt started Wobenzym N for joint pain and red yeast rice ~2 weeks ago. Instructed patient to notify pharmacist if starting any new herbals or natural medicines.  Patient advised to contact clinic  or seek medical attention if signs/symptoms of bleeding or thromboembolism occur.  Patient verbalized understanding by repeating back information and was advised to contact me if further medication-related questions arise. Patient was also provided an information handout.  Follow-up Pt will f/u via telephone in 1 week.  Terald Sleeper PharmD Candidate, c/o 2019 11/20/2015 10:21 AM  10 minutes spent face-to-face with the patient during the encounter. 25% of time spent on education. 75% of time was spent on INR check and dose adjustment.

## 2015-11-20 NOTE — Progress Notes (Signed)
Patient was seen in clinic with Terald Sleeper, PharmD candidate. I agree with the assessment and plan of care documented.

## 2015-11-27 NOTE — Progress Notes (Signed)
  [facility] [address] [place], [place] [address] Phone: [phone] Subjective:     CC: Right hip pain follow up, multiple joint pains Finger injury  [id number]  Paul Ryan is a 70 y.o. male coming in with complaint of right hip pain. Patient was seen previously and did have a calcific changes of the greater trochanteric bursa. Patient did have a steroid injection and states that he had multiple months of being pain-free.  Patient though unfortunately continued to have A weakness as well as pain of the hips as well as the shoulder girdles bilaterally. Patient was found to have a significant elevated ESR Went to rheumatology may continue to try to put him on Plaquenil which he does not want to do.  Patient is complaining of a finger injury. Patient was seen in the emergency department on 11/05/2015. Patient had swelling in the third and fourth fingers. With a wooden handled garden tool  had enough swelling that he was unable to get his clothes off. Had to cut it off. Patient did have x-rays when he was in the emergency room. This was independently visualized by me showingsignificant bony normality. Has been 3 weeks since injury and has been doing better. Still cannot close the fourth finger all the way.     Past medical history, social, surgical and family history all reviewed in electronic medical record.   Review of Systems: No headache, visual changes, nausea, vomiting, diarrhea, constipation, dizziness, abdominal pain, skin rash, fevers, chills, night sweats, weight loss, swollen lymph nodes, body aches, joint swelling, muscle aches, chest pain, shortness of breath, mood changes.   Objective  Blood pressure 122/72, pulse 61, weight 161 lb (73 kg), SpO2 98 %.  General: No apparent distress alert and oriented x3 mood and affect normal, dressed appropriately.  HEENT: Pupils equal, extraocular movements intact  Respiratory: Patient's speak in full  sentences and does not appear short of breath  Cardiovascular: No lower extremity edema, non tender, no erythema  Skin: Warm dry intact with no signs of infection or rash on extremities or on axial skeleton.  Abdomen: Soft nontender  Neuro: Cranial nerves II through XII are intact, neurovascularly intact in all extremities with 2+ DTRs and 2+ pulses.  Lymph: No lymphadenopathy of posterior or anterior cervical chain or axillae bilaterally.  Gait normal with good balance and coordination.  MSK:  Non tender with full range of motion and good stability and symmetric strength and tone of shoulders, elbows, wrist,  knee and ankles bilaterally. Patient though does have significant weakness from getting up out of a chair more than previous exam.  Hand exam on the left hand shows the patient is missing index finger. Right hand fourth finger does have some mild discoloration noted. Neurovascular intact. Patient does have full passive flexion but does have some mild swelling. Pain with active flexion at the PIP.  Limited musculoskeletal ultrasound was performed and interpreted by Lyndal Pulley  Limited ultrasound shows the patient did have what appeared to be a proximal phalanx fracture. Callus formation noted. Next articular. No Displacement noted. Impression: Healing x-ray articular phalanx fracture      Impression and Recommendations:     This case required medical decision making of moderate complexity.

## 2015-11-28 ENCOUNTER — Encounter: Payer: Self-pay | Admitting: Family Medicine

## 2015-11-28 ENCOUNTER — Ambulatory Visit (INDEPENDENT_AMBULATORY_CARE_PROVIDER_SITE_OTHER): Payer: Medicare Other | Admitting: Family Medicine

## 2015-11-28 ENCOUNTER — Other Ambulatory Visit: Payer: Self-pay

## 2015-11-28 VITALS — BP 122/72 | HR 61 | Wt 161.0 lb

## 2015-11-28 DIAGNOSIS — M79644 Pain in right finger(s): Secondary | ICD-10-CM | POA: Diagnosis not present

## 2015-11-28 DIAGNOSIS — S62634A Displaced fracture of distal phalanx of right ring finger, initial encounter for closed fracture: Secondary | ICD-10-CM | POA: Diagnosis not present

## 2015-11-28 DIAGNOSIS — IMO0001 Reserved for inherently not codable concepts without codable children: Secondary | ICD-10-CM | POA: Insufficient documentation

## 2015-11-28 NOTE — Patient Instructions (Signed)
Good to see you  The finger will heal.  Ice can help Vitamin D 5000 IU daily for 2 weeks.  See me again when you need me.

## 2015-11-28 NOTE — Assessment & Plan Note (Signed)
Noted on ultrasound today. Does show good healing. Encourage him to take the vitamin D regularly. We discussed potential splinting. Patient will likely heal over the course the next 6 weeks. Patient will follow-up in 6 weeks if not better. New problem.

## 2015-11-29 ENCOUNTER — Telehealth (INDEPENDENT_AMBULATORY_CARE_PROVIDER_SITE_OTHER): Payer: Medicare Other | Admitting: Pharmacist

## 2015-11-29 ENCOUNTER — Encounter: Payer: Self-pay | Admitting: Family Medicine

## 2015-11-29 DIAGNOSIS — D688 Other specified coagulation defects: Secondary | ICD-10-CM

## 2015-11-29 DIAGNOSIS — D689 Coagulation defect, unspecified: Secondary | ICD-10-CM

## 2015-11-29 DIAGNOSIS — I82509 Chronic embolism and thrombosis of unspecified deep veins of unspecified lower extremity: Secondary | ICD-10-CM | POA: Diagnosis not present

## 2015-11-29 DIAGNOSIS — I2782 Chronic pulmonary embolism: Secondary | ICD-10-CM

## 2015-11-29 DIAGNOSIS — Z7901 Long term (current) use of anticoagulants: Secondary | ICD-10-CM

## 2015-11-29 DIAGNOSIS — D6859 Other primary thrombophilia: Secondary | ICD-10-CM

## 2015-11-29 LAB — PROTIME-INR
INR: 2.6
INR: 3.1

## 2015-11-29 LAB — POCT INR: INR: 2

## 2015-11-29 NOTE — Patient Instructions (Signed)
Patient educated about medication as defined in this encounter and verbalized understanding by repeating back instructions provided.   

## 2015-11-29 NOTE — Progress Notes (Signed)
Anticoagulation Management Paul Ryan is a 70 y.o. male who reports to the clinic for monitoring of warfarin treatment.    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]       Comments:   DR [id number]       ASSESSMENT Recent Results: The most recent result is correlated with 24 mg per week: Lab Results  Component Value Date   INR 2.0 11/29/2015   INR 3.5 11/14/2015   INR 2.6 11/08/2015   PROTIME 15.6 (H) 05/17/2013    Anticoagulation Dosing: INR as of 11/29/2015 and Previous Dosing Information    INR Dt INR Goal [name] [name]   11/29/2015 2.0 2.0-3.0 24 mg 4 mg 4 mg 2 mg 4 mg 2 mg 4 mg 4 mg    Anticoagulation Dose Instructions as of 11/29/2015      Total [name]   New Dose 24 mg 4 mg 4 mg 4 mg 2 mg 4 mg 2 mg 4 mg     (4 mg x 1)  (4 mg x 1)  (4 mg x 1)  (4 mg x 0.5)  (4 mg x 1)  (4 mg x 0.5)  (4 mg x 1)                           INR today: Therapeutic  PLAN Weekly dose was unchanged   Patient Instructions  Patient educated about medication as defined in this encounter and verbalized understanding by repeating back instructions provided.   Patient advised to contact clinic or seek medical attention if signs/symptoms of bleeding or thromboembolism occur.  Patient verbalized understanding by repeating back information and was advised to contact me if further medication-related  questions arise. Patient was also provided an information handout.  Follow-up 1-2 weeks  [name],[name]

## 2015-11-30 ENCOUNTER — Other Ambulatory Visit: Payer: Self-pay | Admitting: Student-PharmD

## 2015-11-30 ENCOUNTER — Other Ambulatory Visit: Payer: Self-pay | Admitting: Pharmacist

## 2015-11-30 DIAGNOSIS — D689 Coagulation defect, unspecified: Secondary | ICD-10-CM

## 2015-11-30 DIAGNOSIS — I82509 Chronic embolism and thrombosis of unspecified deep veins of unspecified lower extremity: Secondary | ICD-10-CM

## 2015-11-30 DIAGNOSIS — I2782 Chronic pulmonary embolism: Secondary | ICD-10-CM

## 2015-11-30 DIAGNOSIS — D6859 Other primary thrombophilia: Secondary | ICD-10-CM

## 2015-11-30 DIAGNOSIS — Z7901 Long term (current) use of anticoagulants: Secondary | ICD-10-CM

## 2015-11-30 MED ORDER — WARFARIN SODIUM 4 MG PO TABS: 4.0000 mg | ORAL_TABLET | Freq: Every day | ORAL | 3 refills | Status: DC

## 2015-11-30 NOTE — Progress Notes (Signed)
Refill request for warfarin. Prescription submitted to [facility]

## 2015-12-01 NOTE — Progress Notes (Signed)
Reviewed Thanks DrG 

## 2015-12-08 ENCOUNTER — Encounter: Payer: Self-pay | Admitting: Family Medicine

## 2015-12-10 MED ORDER — ALENDRONATE SODIUM 70 MG PO TABS: 70.0000 mg | ORAL_TABLET | ORAL | 11 refills | Status: DC

## 2015-12-10 NOTE — Addendum Note (Signed)
Addended by: Lyndal Pulley on: 12/10/2015 10:12 AM   Modules accepted: Orders

## 2015-12-13 ENCOUNTER — Telehealth (INDEPENDENT_AMBULATORY_CARE_PROVIDER_SITE_OTHER): Payer: Medicare Other | Admitting: Pharmacist

## 2015-12-13 DIAGNOSIS — I2782 Chronic pulmonary embolism: Secondary | ICD-10-CM | POA: Diagnosis not present

## 2015-12-13 DIAGNOSIS — D688 Other specified coagulation defects: Secondary | ICD-10-CM | POA: Diagnosis not present

## 2015-12-13 DIAGNOSIS — D689 Coagulation defect, unspecified: Secondary | ICD-10-CM

## 2015-12-13 DIAGNOSIS — D6859 Other primary thrombophilia: Secondary | ICD-10-CM

## 2015-12-13 DIAGNOSIS — I82509 Chronic embolism and thrombosis of unspecified deep veins of unspecified lower extremity: Secondary | ICD-10-CM

## 2015-12-13 DIAGNOSIS — Z7901 Long term (current) use of anticoagulants: Secondary | ICD-10-CM

## 2015-12-13 LAB — POCT INR: INR: 2.4

## 2015-12-13 NOTE — Progress Notes (Signed)
Anticoagulation Management Daniel Tienken is a 70 y.o. male who reports to the clinic for monitoring of warfarin treatment.    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] Duration: indefinite  Anticoagulation Clinic Visit History: Patient does not report signs/symptoms of bleeding or thromboembolism  Anticoagulation Episode Summary    Current INR goal:   2.0-3.0  TTR:   61.0 % (1.3 y)  Next INR check:   12/26/2015  INR from last check:   2.4 (12/13/2015)  Weekly max dose:     Target end date:     INR check location:     Preferred lab:     Send INR reminders to:      Indications   Chronic anticoagulation [Z79.01] Coagulopathy ([place]) [[name]] DVT (deep venous thrombosis) ([name]) [[name]] Pulmonary embolism ([name]) [[name]] 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]       Comments:   DR [id number]       ASSESSMENT Recent Results: The most recent result is correlated with 24 mg per week: Lab Results  Component Value Date   INR 2.4 12/13/2015   INR 2.0 11/29/2015   INR 3.5 11/14/2015   PROTIME 15.6 (H) 05/17/2013   Anticoagulation Dosing: INR as of 12/13/2015 and Previous Dosing Information    INR Dt INR Goal [name] [name]   12/13/2015 2.4 2.0-3.0 24 mg 4 mg 4 mg 4 mg 2 mg 4 mg 2 mg 4 mg    Anticoagulation Dose Instructions as of 12/13/2015      Total [name]   New Dose 24 mg 4 mg 4 mg 4 mg 2 mg 4 mg 2 mg 4 mg     (4 mg x 1)  (4 mg x 1)  (4 mg x 1)  (4 mg x 0.5)  (4 mg x 1)  (4 mg x 0.5)  (4 mg x 1)                           INR today: Therapeutic  PLAN Weekly dose was unchanged   Patient Instructions  Patient educated about medication as defined in this encounter and verbalized understanding by repeating back instructions provided.   Patient advised to contact  clinic or seek medical attention if signs/symptoms of bleeding or thromboembolism occur.  Patient verbalized understanding by repeating back information and was advised to contact me if further medication-related questions arise. Patient was also provided an information handout.  Follow-up 2 weeks  Jesaiah Fabiano J

## 2015-12-13 NOTE — Telephone Encounter (Signed)
Reviewed Thanks DrG 

## 2015-12-13 NOTE — Patient Instructions (Signed)
Patient educated about medication as defined in this encounter and verbalized understanding by repeating back instructions provided.   

## 2015-12-20 ENCOUNTER — Ambulatory Visit (INDEPENDENT_AMBULATORY_CARE_PROVIDER_SITE_OTHER): Payer: Medicare Other | Admitting: Family Medicine

## 2015-12-20 ENCOUNTER — Encounter: Payer: Self-pay | Admitting: Family Medicine

## 2015-12-20 DIAGNOSIS — M19041 Primary osteoarthritis, right hand: Secondary | ICD-10-CM | POA: Diagnosis not present

## 2015-12-20 NOTE — Assessment & Plan Note (Signed)
Patient seems to be doing relatively well over all but I do think the patient is going to have some post-arthritic changes of the finger. We discussed possible injections if necessary. No need for bracing. Follow-up as needed

## 2015-12-20 NOTE — Progress Notes (Signed)
[name] [place],  [address] Phone: [phone] Subjective:     CC: Right hip pain follow up, multiple joint pains Finger injury  [id number]  Paul Ryan is a 70 y.o. male coming in with complaint of right hip pain. Patient was seen previously and did have a calcific changes of the greater trochanteric bursa. Patient did have a steroid injection and states that he had multiple months of being pain-free.  Patient though unfortunately continued to have A weakness as well as pain of the hips as well as the shoulder girdles bilaterally. Patient was found to have a significant elevated ESR Went to rheumatology may continue to try to put him on Plaquenil which he does not want to do.Seems stable around all load this time  Patient is complaining of a finger injury. Patient was seen in the emergency department on 11/05/2015. Patient had swelling in the third and fourth fingers. Patient sent have a little phalanx fracture. Patient decided to do bracing. States that overall he is not having any pain but he does not have full range of motion. Continues to give him trouble in the fourth finger. 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  Past Medical History:  Diagnosis Date  . Allergy    seasonal  . Chronic anticoagulation 06/14/2013  . Coagulopathy ([place]) 05/16/2011  . Collagen vascular disease ([place])   . DVT (deep venous thrombosis) ([name])    takes coumadin  . Dysuria 02/27/2013  . 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 Hyperglycemia 12/26/2014  . Hyperlipidemia   . Hypothyroid 06/10/2011  . Left hip pain 12/17/2010  . Polymyalgia ([place]) 03/21/2014  . 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 Preventative health care 12/17/2010  . Shingles 1982   right abdominal wall  . Thyroid nodule 11/29/2014  . Vitamin D deficiency 10/14/2011   Past Surgical History:  Procedure  Laterality Date  . 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 Smoking status: Former Smoker    Packs/day: 1.00    Years: 20.00    Types: Cigarettes    Quit date: 03/03/1980  . Smokeless tobacco: Never Used  . Alcohol use No   No Known Allergies Family History  Problem Relation Age of Onset  . Cancer Mother     unknown- everywhere  . Cancer Sister     breast          Past medical history, social, surgical and family history all reviewed in electronic medical record.   Review of Systems: No headache, visual changes, nausea, vomiting, diarrhea, constipation, dizziness, abdominal pain, skin rash, fevers, chills, night sweats, weight loss, swollen lymph nodes chest pain, shortness of breath, mood changes.   Objective  Blood pressure 112/72, pulse 70, SpO2 97 %.  General: No apparent distress alert and oriented x3 mood and affect normal, dressed appropriately.  HEENT: Pupils equal, extraocular movements intact  Respiratory: Patient's speak in full sentences and does not appear short of breath  Cardiovascular: No lower extremity edema, non tender, no erythema  Skin: Warm dry intact with no signs of infection or rash on extremities or on axial skeleton.  Abdomen: Soft nontender  Neuro: Cranial nerves II through XII are intact, neurovascularly intact in all extremities with 2+ DTRs and 2+ pulses.  Lymph: No lymphadenopathy of posterior or  anterior cervical chain or axillae bilaterally.  Gait normal with good balance and coordination.  MSK:  Non tender with full range of motion and good stability and symmetric strength and tone of shoulders, elbows, wrist,  knee and ankles bilaterally. Patient though does have significant weakness from getting up out of a chair more than previous exam.  Hand exam on the left hand shows the patient is missing index finger. Right hand fourth finger does have some mild discoloration noted still.  Neurovascular intact. Patient does have full passive flexion but does have some mild swelling. No pain on exam which is improvement.        Impression and Recommendations:     This case required medical decision making of moderate complexity.

## 2015-12-20 NOTE — Patient Instructions (Addendum)
Good to see you  We tried an injection in the finger today

## 2015-12-23 ENCOUNTER — Encounter: Payer: Self-pay | Admitting: Oncology

## 2015-12-25 LAB — PROTIME-INR

## 2015-12-27 ENCOUNTER — Telehealth (INDEPENDENT_AMBULATORY_CARE_PROVIDER_SITE_OTHER): Payer: Medicare Other | Admitting: Pharmacist

## 2015-12-27 ENCOUNTER — Encounter: Payer: Self-pay | Admitting: Family Medicine

## 2015-12-27 DIAGNOSIS — Z7901 Long term (current) use of anticoagulants: Secondary | ICD-10-CM | POA: Diagnosis not present

## 2015-12-27 DIAGNOSIS — I2782 Chronic pulmonary embolism: Secondary | ICD-10-CM | POA: Diagnosis not present

## 2015-12-27 DIAGNOSIS — D689 Coagulation defect, unspecified: Secondary | ICD-10-CM

## 2015-12-27 DIAGNOSIS — D6859 Other primary thrombophilia: Secondary | ICD-10-CM

## 2015-12-27 DIAGNOSIS — I82509 Chronic embolism and thrombosis of unspecified deep veins of unspecified lower extremity: Secondary | ICD-10-CM | POA: Diagnosis not present

## 2015-12-27 DIAGNOSIS — D688 Other specified coagulation defects: Secondary | ICD-10-CM

## 2015-12-27 LAB — POCT INR: INR: 2.4

## 2015-12-27 MED ORDER — WARFARIN SODIUM 4 MG PO TABS: 4.0000 mg | ORAL_TABLET | Freq: Every day | ORAL | 3 refills | Status: DC

## 2015-12-27 NOTE — Telephone Encounter (Signed)
Lab reviewed. INR therapeutic. Thanks DrG

## 2015-12-27 NOTE — Patient Instructions (Signed)
Patient educated about medication as defined in this encounter and verbalized understanding by repeating back instructions provided.   

## 2015-12-27 NOTE — Progress Notes (Signed)
Anticoagulation Management Paul Ryan is a 70 y.o. male who was contacted for monitoring of warfarin treatment.    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  Anticoagulation Clinic Visit History: Patient does not report signs/symptoms of bleeding or thromboembolism   Anticoagulation Episode Summary    Current INR goal:   2.0-3.0  TTR:   62.1 % (1.4 y)  Next INR check:   01/28/2016  INR from last check:   2.4 (12/27/2015)  Weekly max dose:     Target end date:     INR check location:     Preferred lab:     Send INR reminders to:      Indications   Chronic anticoagulation [Z79.01] Coagulopathy ([place]) [[name]] DVT (deep venous thrombosis) ([name]) [[name]] Pulmonary embolism ([name]) [[name]] 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]       Comments:   DR [id number]       ASSESSMENT Recent Results: The most recent result is correlated with 24 mg per week: Lab Results  Component Value Date   INR 2.4 12/27/2015   INR 2.4 12/13/2015   INR 2.0 11/29/2015   PROTIME 15.6 (H) 05/17/2013   Anticoagulation Dosing: INR as of 12/27/2015 and Previous Dosing Information    INR Dt INR Goal [name] [name]   12/27/2015 2.4 2.0-3.0 24 mg 4 mg 4 mg 4 mg 2 mg 4 mg 2 mg 4 mg    Anticoagulation Dose Instructions as of 12/27/2015      Total [name]   New Dose 24 mg 4 mg 4 mg 4 mg 2 mg 4 mg 4 mg 2 mg     (4 mg x 1)  (4 mg x 1)  (4 mg x 1)  (4 mg x 0.5)  (4 mg x 1)  (4 mg x 1)  (4 mg x 0.5)                           INR today: Therapeutic  PLAN Weekly dose was unchanged   Patient Instructions  Patient educated about medication as defined in this encounter and verbalized understanding by repeating back instructions provided.   Patient advised to contact clinic or seek medical attention if signs/symptoms of bleeding or thromboembolism occur.  Patient verbalized understanding by repeating back information and was  advised to contact me if further medication-related questions arise. Patient was also provided an information handout.  Follow-up 4 weeks  [name],[name]

## 2015-12-28 ENCOUNTER — Other Ambulatory Visit: Payer: Self-pay | Admitting: Family Medicine

## 2015-12-28 LAB — PROTIME-INR

## 2015-12-28 MED ORDER — THYROID 90 MG PO TABS: ORAL_TABLET | ORAL | 2 refills | Status: DC

## 2015-12-28 MED ORDER — THYROID 60 MG PO TABS: ORAL_TABLET | ORAL | 2 refills | Status: DC

## 2015-12-31 ENCOUNTER — Encounter: Payer: Self-pay | Admitting: Family Medicine

## 2015-12-31 DIAGNOSIS — M19041 Primary osteoarthritis, right hand: Secondary | ICD-10-CM

## 2016-01-01 LAB — PROTIME-INR

## 2016-01-05 ENCOUNTER — Encounter: Payer: Self-pay | Admitting: Family Medicine

## 2016-01-07 ENCOUNTER — Ambulatory Visit (INDEPENDENT_AMBULATORY_CARE_PROVIDER_SITE_OTHER): Payer: Medicare Other | Admitting: Oncology

## 2016-01-07 ENCOUNTER — Encounter: Payer: Self-pay | Admitting: Oncology

## 2016-01-07 VITALS — BP 105/59 | HR 52 | Temp 98.0°F | Ht 70.0 in | Wt 163.0 lb

## 2016-01-07 DIAGNOSIS — D689 Coagulation defect, unspecified: Secondary | ICD-10-CM

## 2016-01-07 DIAGNOSIS — Z89022 Acquired absence of left finger(s): Secondary | ICD-10-CM

## 2016-01-07 DIAGNOSIS — D72829 Elevated white blood cell count, unspecified: Secondary | ICD-10-CM

## 2016-01-07 DIAGNOSIS — Z7901 Long term (current) use of anticoagulants: Secondary | ICD-10-CM

## 2016-01-07 DIAGNOSIS — I2782 Chronic pulmonary embolism: Secondary | ICD-10-CM | POA: Diagnosis not present

## 2016-01-07 DIAGNOSIS — D688 Other specified coagulation defects: Secondary | ICD-10-CM | POA: Diagnosis not present

## 2016-01-07 DIAGNOSIS — D6859 Other primary thrombophilia: Secondary | ICD-10-CM

## 2016-01-07 DIAGNOSIS — Z79891 Long term (current) use of opiate analgesic: Secondary | ICD-10-CM

## 2016-01-07 DIAGNOSIS — I82509 Chronic embolism and thrombosis of unspecified deep veins of unspecified lower extremity: Secondary | ICD-10-CM

## 2016-01-07 DIAGNOSIS — Z8611 Personal history of tuberculosis: Secondary | ICD-10-CM

## 2016-01-07 DIAGNOSIS — M79641 Pain in right hand: Secondary | ICD-10-CM

## 2016-01-07 NOTE — Progress Notes (Signed)
Hematology and Oncology Follow Up Visit  Paul Ryan [id number] October 23, 1945 70 y.o. 01/07/2016 7:28 PM   Principle Diagnosis: Encounter Diagnoses  Name Primary?  . Coagulopathy ([name]) Yes  . 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 Leukocytosis, unspecified type   . Other chronic pulmonary embolism without acute cor pulmonale (Paul Ryan)   . 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 He sustained a non-provoked right lower extremity DVT in June 2010. He was initially anticoagulated with Coumadin for 1 year due to the fact that he travels frequently to [place]. Protein S and protein C levels were checked when he was off Coumadin and he had a reproducible decrease in functional protein S with borderline decrease in free protein S. He was put back on Coumadin. He was changed to Xarelto in April 2015 and did well until June 2016. He developed a bronchitis with a dry cough while he was visiting [place] and then dyspnea and pleuritic chest pain. CT scan of the chest done 08/07/2014 positive for left lower lobe pulmonary embolus with a single filling defect in a left lower lobe pulmonary artery. I elected to put him back on Coumadin at that time and stop the Xarelto. Of note in January 2016 he began to develop polymyalgia. I initially thought this was from the [name] but there was no improvement when Xarelto was temporarily discontinued. He had a significant elevation of ESR 84 mm. ANA negative, Rheumatoid factor negative. Muscle enzymes normal. ANCA negative. ACE level normal. Cyclic citrulline peptide antibodies undetectable. Sjogren antibodies undetectable. No improvement in symptoms off Crestor. He was put on a trial of prednisone by sports medicine physician Dr. Gardenia Phlegm. Symptoms did improve. Findings were not felt to be typical of polymyalgia rheumatica but he  has seen a Rheumatologist.  He is currently off all prednisone. Although I have not seen him for a formal evaluation, he keeps in touch with me on a regular basis. Since visit with me last year, he had to go on treatment for latent tuberculosis with [name] and rifampin beginning in January 2017. This destabilized his Coumadin levels. He is currently back on track.   Interim History:   He struck his fingers with a gardening tool back in June and has had persistent pain and decreased mobility of the fourth digit of his right hand. Regular x-rays did not show a fracture. He was evaluated by his sports medicine physician. No specific treatment was recommended. 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  He has had no problems with dyspnea, chest pain, palpitations, leg swelling or pain.  Medications: reviewed  Allergies: No Known Allergies  Review of Systems: See interim history Remaining ROS negative:   Physical Exam: Blood pressure (!) 105/59, pulse (!) 52, temperature 98 F (36.7 C), temperature source Oral, height '5\' 10"'$  (1.778 m), weight 163 lb (73.9 kg), SpO2 100 %. Wt Readings from Last 3 Encounters:  01/07/16 163 lb (73.9 kg)  11/28/15 161 lb (73 kg)  11/05/15 164 lb (74.4 kg)     General appearance: Thin Asian man HENNT: Pharynx no erythema, exudate, mass, or ulcer. No thyromegaly or thyroid nodules Lymph nodes: No cervical, supraclavicular, or axillary lymphadenopathy Breasts:  Lungs: Clear to auscultation, resonant to percussion throughout Heart:  Regular rhythm, no murmur, no gallop, no rub, no click, no edema Abdomen: Soft, nontender, normal bowel sounds, no mass, no organomegaly Extremities: No edema, no calf tenderness. Asymmetric mild swelling of the left calf compared with the right from previous DVTs. Musculoskeletal: no joint deformities. Some  difficulty flexing the fourth finger on his right hand. GU:  Vascular: Carotid pulses 2+, no bruits, Neurologic: Alert, oriented, PERRLA, optic discs sharp and vessels normal, no hemorrhage or exudate, cranial nerves grossly normal, motor strength 5 over 5, reflexes 1+ symmetric, upper body coordination normal, gait normal, Skin: No rash or ecchymosis  Lab Results: CBC W/Diff    Component Value Date/Time   WBC 6.1 10/15/2015 0753   RBC 4.88 10/15/2015 0753   HGB 15.0 10/15/2015 0753   HGB 15.2 05/17/2013 0902   HCT 44.0 10/15/2015 0753   HCT 42.9 08/15/2015 1050   HCT 44.6 05/17/2013 0902   PLT 219.0 10/15/2015 0753   PLT 178 08/15/2015 1050   MCV 90.1 10/15/2015 0753   MCV 89 08/15/2015 1050   MCV 89.4 05/17/2013 0902   MCH 30.4 08/15/2015 1050   MCH 31.1 09/19/2014 1135   MCHC 34.1 10/15/2015 0753   RDW 14.0 10/15/2015 0753   RDW 15.3 08/15/2015 1050   RDW 13.7 05/17/2013 0902   LYMPHSABS 2.0 10/15/2015 0753   LYMPHSABS 1.8 08/15/2015 1050   LYMPHSABS 1.7 05/17/2013 0902   MONOABS 0.5 10/15/2015 0753   MONOABS 0.4 05/17/2013 0902   EOSABS 0.2 10/15/2015 0753   EOSABS 0.1 08/15/2015 1050   BASOSABS 0.0 10/15/2015 0753   BASOSABS 0.0 08/15/2015 1050   BASOSABS 0.0 05/17/2013 0902     Chemistry      Component Value Date/Time   NA 139 10/15/2015 0753   NA 143 11/16/2012 1145   K 4.8 10/15/2015 0753   K 4.4 11/16/2012 1145   CL 105 10/15/2015 0753   CO2 28 10/15/2015 0753   CO2 27 11/16/2012 1145   BUN 17 10/15/2015 0753   BUN 16.2 11/16/2012 1145   CREATININE 1.14 10/15/2015 0753   CREATININE 0.90 03/21/2014 1521   CREATININE 1.1 11/16/2012 1145      Component Value Date/Time   CALCIUM 9.6 10/15/2015 0753   CALCIUM 9.3 11/16/2012 1145   ALKPHOS 44 10/15/2015 0753   ALKPHOS 48 11/16/2012 1145   AST 27 10/15/2015 0753   AST 23 11/16/2012 1145   ALT 32 10/15/2015 0753   ALT 40 11/16/2012 1145   BILITOT 0.7 10/15/2015 0753   BILITOT 0.75 11/16/2012 1145        Radiological Studies: No results found.  Impression:  #1. 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 Status post right lower extremity DVT June 2010. Status post pulmonary embolus to a single left lower lobe pulmonary artery branch June 2016 occurring at time of a acute respiratory infection and while on Xarelto  #3. Chronic anticoagulation back on Coumadin in view of #2 above.  #4. Probable polymyalgia rheumatica Initial course of low-dose prednisone. Currently off all steroids.    #5. Treated latent tuberculosis January through June 2017  #6. Blunt trauma to right hand. Persistent pain. Hand surgery consultation pending.  CC: Patient Care Team: [name], MD as PCP - General (Family Medicine) Roena Malady, MD (Ophthalmology) Forde Dandy, [facility] as Pharmacist (Pharmacist)   Annia Belt, MD 11/6/20177:28 PM

## 2016-01-07 NOTE — Patient Instructions (Signed)
Continue current dose of coumadin Return visit 6 months  Lab 1 week before visit

## 2016-01-08 ENCOUNTER — Other Ambulatory Visit: Payer: Self-pay | Admitting: Family Medicine

## 2016-01-08 DIAGNOSIS — M79641 Pain in right hand: Secondary | ICD-10-CM

## 2016-01-15 ENCOUNTER — Encounter: Payer: Self-pay | Admitting: Family Medicine

## 2016-01-15 ENCOUNTER — Other Ambulatory Visit: Payer: Self-pay | Admitting: Family Medicine

## 2016-01-15 DIAGNOSIS — R739 Hyperglycemia, unspecified: Secondary | ICD-10-CM

## 2016-01-15 DIAGNOSIS — E785 Hyperlipidemia, unspecified: Secondary | ICD-10-CM

## 2016-01-15 DIAGNOSIS — D72829 Elevated white blood cell count, unspecified: Secondary | ICD-10-CM

## 2016-01-15 DIAGNOSIS — E039 Hypothyroidism, unspecified: Secondary | ICD-10-CM

## 2016-01-17 ENCOUNTER — Other Ambulatory Visit: Payer: Self-pay | Admitting: Pharmacist

## 2016-01-17 ENCOUNTER — Ambulatory Visit: Payer: Medicare Other | Admitting: Pharmacist

## 2016-01-17 MED ORDER — WARFARIN SODIUM 4 MG PO TABS: 4.0000 mg | ORAL_TABLET | Freq: Every day | ORAL | 3 refills | Status: DC

## 2016-01-18 ENCOUNTER — Other Ambulatory Visit (INDEPENDENT_AMBULATORY_CARE_PROVIDER_SITE_OTHER): Payer: Medicare Other

## 2016-01-18 DIAGNOSIS — R739 Hyperglycemia, unspecified: Secondary | ICD-10-CM | POA: Diagnosis not present

## 2016-01-18 DIAGNOSIS — E785 Hyperlipidemia, unspecified: Secondary | ICD-10-CM

## 2016-01-18 DIAGNOSIS — E559 Vitamin D deficiency, unspecified: Secondary | ICD-10-CM

## 2016-01-18 DIAGNOSIS — D72829 Elevated white blood cell count, unspecified: Secondary | ICD-10-CM

## 2016-01-18 DIAGNOSIS — D688 Other specified coagulation defects: Secondary | ICD-10-CM | POA: Diagnosis not present

## 2016-01-18 DIAGNOSIS — E039 Hypothyroidism, unspecified: Secondary | ICD-10-CM

## 2016-01-18 DIAGNOSIS — D6859 Other primary thrombophilia: Secondary | ICD-10-CM

## 2016-01-18 DIAGNOSIS — D689 Coagulation defect, unspecified: Secondary | ICD-10-CM | POA: Diagnosis not present

## 2016-01-18 LAB — LIPID PANEL
CHOLESTEROL: 235 mg/dL — AB (ref 0–200)
HDL: 50.2 mg/dL (ref >39.00)
LDL CALC: 165 mg/dL — AB (ref 0–99)
NonHDL: 184.64
TRIGLYCERIDES: 97 mg/dL (ref 0.0–149.0)
Total CHOL/HDL Ratio: 5
VLDL: 19.4 mg/dL (ref 0.0–40.0)

## 2016-01-18 LAB — COMPREHENSIVE METABOLIC PANEL
ALBUMIN: 4.2 g/dL (ref 3.5–5.2)
ALK PHOS: 44 U/L (ref 39–117)
ALT: 29 U/L (ref 0–53)
AST: 24 U/L (ref 0–37)
BUN: 18 mg/dL (ref 6–23)
CALCIUM: 9.3 mg/dL (ref 8.4–10.5)
CHLORIDE: 105 meq/L (ref 96–112)
CO2: 27 mEq/L (ref 19–32)
CREATININE: 1.03 mg/dL (ref 0.40–1.50)
GFR: 75.58 mL/min (ref >60.00)
Glucose, Bld: 93 mg/dL (ref 70–99)
POTASSIUM: 4.9 meq/L (ref 3.5–5.1)
Sodium: 139 mEq/L (ref 135–145)
TOTAL PROTEIN: 7.2 g/dL (ref 6.0–8.3)
Total Bilirubin: 0.8 mg/dL (ref 0.2–1.2)

## 2016-01-18 LAB — CBC
HEMATOCRIT: 46.2 % (ref 39.0–52.0)
Hemoglobin: 15.3 g/dL (ref 13.0–17.0)
MCHC: 33 g/dL (ref 30.0–36.0)
MCV: 89.6 fl (ref 78.0–100.0)
PLATELETS: 211 10*3/uL (ref 150.0–400.0)
RBC: 5.15 Mil/uL (ref 4.22–5.81)
RDW: 14.1 % (ref 11.5–15.5)
WBC: 7.5 10*3/uL (ref 4.0–10.5)

## 2016-01-18 LAB — T4, FREE: Free T4: 0.64 ng/dL (ref 0.60–1.60)

## 2016-01-18 LAB — TSH: TSH: 0.21 u[IU]/mL — AB (ref 0.35–4.50)

## 2016-01-18 LAB — HEMOGLOBIN A1C: HEMOGLOBIN A1C: 6.1 % (ref 4.6–6.5)

## 2016-01-18 LAB — VITAMIN D 25 HYDROXY (VIT D DEFICIENCY, FRACTURES): VITD: 70.61 ng/mL (ref 30.00–100.00)

## 2016-01-22 ENCOUNTER — Ambulatory Visit (INDEPENDENT_AMBULATORY_CARE_PROVIDER_SITE_OTHER): Payer: Medicare Other | Admitting: Family Medicine

## 2016-01-22 VITALS — BP 98/64 | HR 56 | Temp 97.7°F | Wt 163.4 lb

## 2016-01-22 DIAGNOSIS — D689 Coagulation defect, unspecified: Secondary | ICD-10-CM

## 2016-01-22 DIAGNOSIS — E559 Vitamin D deficiency, unspecified: Secondary | ICD-10-CM

## 2016-01-22 DIAGNOSIS — R7612 Nonspecific reaction to cell mediated immunity measurement of gamma interferon antigen response without active tuberculosis: Secondary | ICD-10-CM

## 2016-01-22 DIAGNOSIS — M353 Polymyalgia rheumatica: Secondary | ICD-10-CM

## 2016-01-22 DIAGNOSIS — E785 Hyperlipidemia, unspecified: Secondary | ICD-10-CM | POA: Diagnosis not present

## 2016-01-22 DIAGNOSIS — I2782 Chronic pulmonary embolism: Secondary | ICD-10-CM

## 2016-01-22 DIAGNOSIS — R739 Hyperglycemia, unspecified: Secondary | ICD-10-CM

## 2016-01-22 DIAGNOSIS — E039 Hypothyroidism, unspecified: Secondary | ICD-10-CM | POA: Diagnosis not present

## 2016-01-22 DIAGNOSIS — K219 Gastro-esophageal reflux disease without esophagitis: Secondary | ICD-10-CM

## 2016-01-22 MED ORDER — THYROID 60 MG PO TABS: ORAL_TABLET | ORAL | 2 refills | Status: DC

## 2016-01-22 MED ORDER — THYROID 90 MG PO TABS: ORAL_TABLET | ORAL | 2 refills | Status: DC

## 2016-01-22 NOTE — Progress Notes (Signed)
Pre visit review using our clinic review tool, if applicable. No additional management support is needed unless otherwise documented below in the visit note. 

## 2016-01-22 NOTE — Progress Notes (Signed)
Patient ID: Paul Ryan, male   DOB: 02-Mar-1946, 70 y.o.   MRN: [id number]   Subjective:    Patient ID: Paul Ryan, male    DOB: 1945-12-23, 70 y.o.   MRN: [id number]  Chief Complaint  Patient presents with  . Follow-up    HPI Patient is in today for follow up. He feels well today. No recent hospitalizations. He continues to struggle with fatigue and some pain but his PMR is greatly improved. No new pain or joint concerns. Is noting some dyspepsia. Is following with a gastroenterologist in [place]. Denies CP/palp/SOB/HA/congestion/fevers/GI or GU c/o. Taking meds as prescribed  Past Medical History:  Diagnosis Date  . Acid reflux 02/03/2016  . Allergy    seasonal  . Chronic anticoagulation 06/14/2013  . Coagulopathy ([place]) 05/16/2011  . Collagen vascular disease ([place])   . DVT (deep venous thrombosis) ([name])    takes coumadin  . Dysuria 02/27/2013  . 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 Hyperglycemia 12/26/2014  . Hyperlipidemia   . Hypothyroid 06/10/2011  . Left hip pain 12/17/2010  . Polymyalgia ([place]) 03/21/2014  . 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 Preventative health care 12/17/2010  . Shingles 1982   right abdominal wall  . Thyroid nodule 11/29/2014  . Vitamin D deficiency 10/14/2011    Past Surgical History:  Procedure Laterality Date  . 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 Cancer Mother     unknown- everywhere  . Cancer Sister     breast    Social History   Social History  . Marital status: Married    Spouse name: N/A  . Number of children: N/A  . Years of education: N/A   Occupational History  . Not on file.   Social History Main Topics  . Smoking status: Former Smoker    Packs/day: 1.00    Years: 20.00    Types: Cigarettes    Quit date: 03/03/1980  . Smokeless tobacco: Never Used  . Alcohol use No  . Drug use: No  . Sexual activity: Not on file   Other Topics  Concern  . Not on file   Social History Narrative  . No narrative on file    Outpatient Medications Prior to Visit  Medication Sig Dispense Refill  . albuterol (PROVENTIL HFA;VENTOLIN HFA) 108 (90 Base) MCG/ACT inhaler Inhale 2 puffs into the lungs every 4 (four) hours as needed for wheezing or shortness of breath. 1 Inhaler 3  . alendronate (FOSAMAX) 70 MG tablet Take 1 tablet (70 mg total) by mouth every 7 (seven) days. Take with a full glass of water on an empty stomach. (Patient not taking: Reported on 01/29/2016) 4 tablet 11  . b complex vitamins tablet Take 1 tablet by mouth daily.      . Cholecalciferol 4000 UNITS TABS Take 1 tablet by mouth daily.    . Coenzyme Q10 (COQ10 PO) Take 2 tablets by mouth daily.    . famotidine (PEPCID) 40 MG tablet Take 40 mg by mouth daily.    Javier Docker Oil 300 MG CAPS 1 cap daily    . NIACIN PO Take 1 tablet by mouth 3 (three) times daily.     . rosuvastatin (CRESTOR) 20 MG tablet TAKE 1 TABLET (20 MG TOTAL) BY MOUTH AT BEDTIME. (Patient not taking: Reported on 01/29/2016) 90 tablet 1  . tacrolimus (PROTOPIC) 0.1 % ointment Apply  topically 2 (two) times daily. 100 g 1  . warfarin (COUMADIN) 4 MG tablet Take 1 tablet (4 mg total) by mouth daily. Take 1/2 tablet Wednesdays and Saturdays 54 tablet 3  . thyroid (ARMOUR THYROID) 60 MG tablet Take 1 tablet by mouth as directed alternating with the 90 mg 45 tablet 2  . thyroid (ARMOUR THYROID) 90 MG tablet Take 1 tablet by mouth as directed alternating with the 60 mg 45 tablet 2   No facility-administered medications prior to visit.     No Known Allergies  Review of Systems  Constitutional: Positive for malaise/fatigue. Negative for fever.  HENT: Negative for congestion and nosebleeds.   Eyes: Negative for blurred vision.  Respiratory: Negative for cough and shortness of breath.   Cardiovascular: Negative for chest pain and palpitations.  Gastrointestinal: Positive for heartburn. Negative for blood in  stool, constipation, melena and vomiting.  Genitourinary: Negative for dysuria.  Musculoskeletal: Positive for joint pain. Negative for back pain.  Skin: Negative for rash.  Neurological: Negative for tingling, loss of consciousness and headaches.       Objective:    Physical Exam  Constitutional: He is oriented to person, place, and time. He appears well-developed and well-nourished. No distress.  HENT:  Head: Normocephalic and atraumatic.  Eyes: Conjunctivae are normal.  Neck: Normal range of motion. No thyromegaly present.  Cardiovascular: Normal rate and regular rhythm.   Pulmonary/Chest: Effort normal and breath sounds normal. He has no wheezes.  Abdominal: Soft. Bowel sounds are normal. He exhibits no mass. There is no tenderness. There is no rebound and no guarding.  Musculoskeletal: Normal range of motion. He exhibits no edema or deformity.  Neurological: He is alert and oriented to person, place, and time. He displays normal reflexes. No cranial nerve deficit. Coordination normal.  Skin: Skin is warm and dry. No rash noted. He is not diaphoretic. No erythema.  Psychiatric: He has a normal mood and affect.    BP 98/64 (BP Location: Left Arm, Patient Position: Sitting, Cuff Size: Normal)   Pulse (!) 56   Temp 97.7 F (36.5 C) (Oral)   Wt 163 lb 6.4 oz (74.1 kg)   SpO2 98%   BMI 23.45 kg/m  Wt Readings from Last 3 Encounters:  01/29/16 160 lb (72.6 kg)  01/22/16 163 lb 6.4 oz (74.1 kg)  01/07/16 163 lb (73.9 kg)     Lab Results  Component Value Date   WBC 7.5 01/18/2016   HGB 15.3 01/18/2016   HCT 46.2 01/18/2016   PLT 211.0 01/18/2016   GLUCOSE 93 01/18/2016   CHOL 235 (H) 01/18/2016   TRIG 97.0 01/18/2016   HDL 50.20 01/18/2016   LDLDIRECT 158.0 03/11/2013   LDLCALC 165 (H) 01/18/2016   ALT 29 01/18/2016   AST 24 01/18/2016   NA 139 01/18/2016   K 4.9 01/18/2016   CL 105 01/18/2016   CREATININE 1.03 01/18/2016   BUN 18 01/18/2016   CO2 27 01/18/2016     TSH 0.21 (L) 01/18/2016   PSA 1.63 10/14/2011   INR 1.9 01/29/2016   HGBA1C 6.1 01/18/2016    Lab Results  Component Value Date   TSH 0.21 (L) 01/18/2016   Lab Results  Component Value Date   WBC 7.5 01/18/2016   HGB 15.3 01/18/2016   HCT 46.2 01/18/2016   MCV 89.6 01/18/2016   PLT 211.0 01/18/2016   Lab Results  Component Value Date   NA 139 01/18/2016   K 4.9 01/18/2016  CHLORIDE 109 11/16/2012   CO2 27 01/18/2016   GLUCOSE 93 01/18/2016   BUN 18 01/18/2016   CREATININE 1.03 01/18/2016   BILITOT 0.8 01/18/2016   ALKPHOS 44 01/18/2016   AST 24 01/18/2016   ALT 29 01/18/2016   PROT 7.2 01/18/2016   ALBUMIN 4.2 01/18/2016   CALCIUM 9.3 01/18/2016   ANIONGAP 8 09/11/2014   GFR 75.58 01/18/2016   Lab Results  Component Value Date   CHOL 235 (H) 01/18/2016   Lab Results  Component Value Date   HDL 50.20 01/18/2016   Lab Results  Component Value Date   LDLCALC 165 (H) 01/18/2016   Lab Results  Component Value Date   TRIG 97.0 01/18/2016   Lab Results  Component Value Date   CHOLHDL 5 01/18/2016   Lab Results  Component Value Date   HGBA1C 6.1 01/18/2016       Assessment & Plan:   Problem List Items Addressed This Visit    Hyperlipidemia    Tolerating statin, encouraged heart healthy diet, avoid trans fats, minimize simple carbs and saturated fats. Increase exercise as tolerated      Relevant Orders   Lipid panel   Coagulopathy ([place])   Relevant Orders   CBC   Hypothyroid    Recently switched from [place] thyroid to Armour thyroid.       Relevant Medications   thyroid (ARMOUR THYROID) 90 MG tablet   thyroid (ARMOUR THYROID) 60 MG tablet   Other Relevant Orders   TSH   T4, free   Vitamin D deficiency    Continue supplements      Relevant Orders   VITAMIN D 25 Hydroxy (Vit-D Deficiency, Fractures)   Polymyalgia rheumatica ([name])    Is doing better off of steroids at the present time      Relevant Orders   CBC   Sed Rate (ESR)    Pulmonary embolism ([name])    Tolerating Coumadin. Doing well      Hyperglycemia - Primary    minimize simple carbs. Increase exercise as tolerated.       Relevant Orders   Comprehensive metabolic panel   Hemoglobin A1c   Positive QuantiFERON-TB Gold test   Acid reflux    Avoid offending foods, start probiotics. Do not eat large meals in late evening and consider raising head of bed. Is following with gastroenterology in [place]         I am having Mr. Buttery maintain his b complex vitamins, Krill Oil, tacrolimus, NIACIN PO, Cholecalciferol, Coenzyme Q10 (COQ10 PO), albuterol, rosuvastatin, famotidine, alendronate, warfarin, Red Yeast Rice Extract, thyroid, and thyroid.  Meds ordered this encounter  Medications  . Red Yeast Rice Extract 600 MG CAPS    Sig: Take 3 capsules by mouth daily.  [name] Kitchen thyroid (ARMOUR THYROID) 90 MG tablet    Sig: Take 1 tablet by mouth as directed alternating with the 60 mg    Dispense:  45 tablet    Refill:  2  . thyroid (ARMOUR THYROID) 60 MG tablet    Sig: Take 1 tablet by mouth as directed alternating with the 90 mg    Dispense:  45 tablet    Refill:  2   CMA functioned as scribe during this visit and I have reviewed the documentation and agree with documentation.  Penni Homans, MD

## 2016-01-22 NOTE — Patient Instructions (Signed)
Carbohydrate Counting for Diabetes Mellitus, Adult Carbohydrate counting is a method for keeping track of how many carbohydrates you eat. Eating carbohydrates naturally increases the amount of sugar (glucose) in the blood. Counting how many carbohydrates you eat helps keep your blood glucose within normal limits, which helps you manage your diabetes (diabetes mellitus). It is important to know how many carbohydrates you can safely have in each meal. This is different for every person. A diet and nutrition specialist (registered dietitian) can help you make a meal plan and calculate how many carbohydrates you should have at each meal and snack. Carbohydrates are found in the following foods:  Grains, such as breads and cereals.  Dried beans and soy products.  Starchy vegetables, such as potatoes, peas, and corn.  Fruit and fruit juices.  Milk and yogurt.  Sweets and snack foods, such as cake, cookies, candy, chips, and soft drinks. How do I count carbohydrates? There are two ways to count carbohydrates in food. You can use either of the methods or a combination of both. Reading "Nutrition Facts" on packaged food  The "Nutrition Facts" list is included on the labels of almost all packaged foods and beverages in the [place] It includes:  The serving size.  Information about nutrients in each serving, including the grams (g) of carbohydrate per serving. To use the "Nutrition Facts":  Decide how many servings you will have.  Multiply the number of servings by the number of carbohydrates per serving.  The resulting number is the total amount of carbohydrates that you will be having. Learning standard serving sizes of other foods  When you eat foods containing carbohydrates that are not packaged or do not include "Nutrition Facts" on the label, you need to measure the servings in order to count the amount of carbohydrates:  Measure the foods that you will eat with a food scale or measuring  cup, if needed.  Decide how many standard-size servings you will eat.  Multiply the number of servings by 15. Most carbohydrate-rich foods have about 15 g of carbohydrates per serving.  For example, if you eat 8 oz (170 g) of strawberries, you will have eaten 2 servings and 30 g of carbohydrates (2 servings x 15 g = 30 g).  For foods that have more than one food mixed, such as soups and casseroles, you must count the carbohydrates in each food that is included. The following list contains standard serving sizes of common carbohydrate-rich foods. Each of these servings has about 15 g of carbohydrates:   hamburger bun or  English muffin.   oz (15 mL) syrup.   oz (14 g) jelly.  1 slice of bread.  1 six-inch tortilla.  3 oz (85 g) cooked rice or pasta.  4 oz (113 g) cooked dried beans.  4 oz (113 g) starchy vegetable, such as peas, corn, or potatoes.  4 oz (113 g) hot cereal.  4 oz (113 g) mashed potatoes or  of a large baked potato.  4 oz (113 g) canned or frozen fruit.  4 oz (120 mL) fruit juice.  4-6 crackers.  6 chicken nuggets.  6 oz (170 g) unsweetened dry cereal.  6 oz (170 g) plain fat-free yogurt or yogurt sweetened with artificial sweeteners.  8 oz (240 mL) milk.  8 oz (170 g) fresh fruit or one small piece of fruit.  24 oz (680 g) popped popcorn. Example of carbohydrate counting Sample meal  3 oz (85 g) chicken breast.  6 oz (  170 g) brown rice.  4 oz (113 g) corn.  8 oz (240 mL) milk.  8 oz (170 g) strawberries with sugar-free whipped topping. Carbohydrate calculation 1. Identify the foods that contain carbohydrates:  Rice.  Corn.  Milk.  Strawberries. 2. Calculate how many servings you have of each food:  2 servings rice.  1 serving corn.  1 serving milk.  1 serving strawberries. 3. Multiply each number of servings by 15 g:  2 servings rice x 15 g = 30 g.  1 serving corn x 15 g = 15 g.  1 serving milk x 15 g = 15  g.  1 serving strawberries x 15 g = 15 g. 4. Add together all of the amounts to find the total grams of carbohydrates eaten:  30 g + 15 g + 15 g + 15 g = 75 g of carbohydrates total. This information is not intended to replace advice given to you by your health care provider. Make sure you discuss any questions you have with your health care provider. Document Released: 02/17/2005 Document Revised: 09/07/2015 Document Reviewed: 08/01/2015 Elsevier Interactive Patient Education  2017 Elsevier Inc.  

## 2016-01-23 LAB — PROTIME-INR: INR: 2.5 — AB (ref 0.9–1.1)

## 2016-01-25 ENCOUNTER — Other Ambulatory Visit: Payer: Self-pay | Admitting: Oncology

## 2016-01-25 DIAGNOSIS — I82509 Chronic embolism and thrombosis of unspecified deep veins of unspecified lower extremity: Secondary | ICD-10-CM

## 2016-01-25 DIAGNOSIS — I2782 Chronic pulmonary embolism: Secondary | ICD-10-CM

## 2016-01-25 DIAGNOSIS — D6859 Other primary thrombophilia: Secondary | ICD-10-CM

## 2016-01-25 DIAGNOSIS — Z7901 Long term (current) use of anticoagulants: Secondary | ICD-10-CM

## 2016-01-25 DIAGNOSIS — D689 Coagulation defect, unspecified: Secondary | ICD-10-CM

## 2016-01-28 ENCOUNTER — Encounter: Payer: Self-pay | Admitting: Family Medicine

## 2016-01-29 ENCOUNTER — Ambulatory Visit (INDEPENDENT_AMBULATORY_CARE_PROVIDER_SITE_OTHER): Payer: Medicare Other | Admitting: Pharmacist

## 2016-01-29 ENCOUNTER — Ambulatory Visit (INDEPENDENT_AMBULATORY_CARE_PROVIDER_SITE_OTHER): Payer: Medicare Other | Admitting: Internal Medicine

## 2016-01-29 ENCOUNTER — Encounter: Payer: Self-pay | Admitting: Internal Medicine

## 2016-01-29 DIAGNOSIS — I82509 Chronic embolism and thrombosis of unspecified deep veins of unspecified lower extremity: Secondary | ICD-10-CM | POA: Diagnosis not present

## 2016-01-29 DIAGNOSIS — D688 Other specified coagulation defects: Secondary | ICD-10-CM

## 2016-01-29 DIAGNOSIS — I2782 Chronic pulmonary embolism: Secondary | ICD-10-CM | POA: Diagnosis not present

## 2016-01-29 DIAGNOSIS — Z7901 Long term (current) use of anticoagulants: Secondary | ICD-10-CM

## 2016-01-29 DIAGNOSIS — J329 Chronic sinusitis, unspecified: Secondary | ICD-10-CM | POA: Insufficient documentation

## 2016-01-29 DIAGNOSIS — D689 Coagulation defect, unspecified: Secondary | ICD-10-CM

## 2016-01-29 DIAGNOSIS — J0110 Acute frontal sinusitis, unspecified: Secondary | ICD-10-CM

## 2016-01-29 DIAGNOSIS — D6859 Other primary thrombophilia: Secondary | ICD-10-CM

## 2016-01-29 LAB — POCT INR: INR: 1.9

## 2016-01-29 MED ORDER — DOXYCYCLINE HYCLATE 100 MG PO TABS: 100.0000 mg | ORAL_TABLET | Freq: Two times a day (BID) | ORAL | 0 refills | Status: DC

## 2016-01-29 NOTE — Progress Notes (Signed)
Reviewed Thanks DrG 

## 2016-01-29 NOTE — Progress Notes (Signed)
Anticoagulation Management Paul Ryan is a 70 y.o. male who reports to the clinic for monitoring of warfarin treatment.    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 Patient states he will be starting doxycycline today.  Anticoagulation Episode Summary    Current INR goal:   2.0-3.0  TTR:   64.1 % (1.4 y)  Next INR check:   01/31/2016  INR from last check:   1.9! 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[place]) [[name]]       Comments:   DR [id number]       ASSESSMENT Recent Results: The most recent result is correlated with 24 mg per week: Lab Results  Component Value Date   INR 1.9 01/29/2016   INR 2.5 (A) 01/17/2016   INR 2.4 12/27/2015   PROTIME 15.6 (H) 05/17/2013   Anticoagulation Dosing: INR as of 01/29/2016 and Previous Dosing Information    INR Dt INR Goal [name] [name]   01/29/2016 1.9 2.0-3.0 24 mg 4 mg 4 mg 4 mg 2 mg 4 mg 4 mg 2 mg    Anticoagulation Dose Instructions as of 01/29/2016      Total [name]   New Dose 26 mg 4 mg 4 mg 4 mg 4 mg 4 mg 4 mg 2 mg     (4 mg x 1)  (4 mg x 1)  (4 mg x 1)  (4 mg x 1)  (4 mg x 1)  (4 mg x 1)  (4 mg x 0.5)                           INR today: Subtherapeutic  PLAN Weekly dose was increased by 8% to 26 mg per week  Patient Instructions  Patient educated about medication as defined in this encounter and verbalized understanding by repeating back instructions provided.   Patient advised to contact clinic or seek medical attention if signs/symptoms of bleeding or  thromboembolism occur.  Patient verbalized understanding by repeating back information and was advised to contact me if further medication-related questions arise. Patient was also provided an information handout.  Follow-up 01/31/16 due to antibiotic start (doxycycline)  Paul Ryan  15 minutes spent face-to-face with the patient during the encounter. 50% of time spent on education. 50% of time was spent on assessment.

## 2016-01-29 NOTE — Patient Instructions (Signed)
Patient educated about medication as defined in this encounter and verbalized understanding by repeating back instructions provided.   

## 2016-01-29 NOTE — Progress Notes (Signed)
Pre visit review using our clinic review tool, if applicable. No additional management support is needed unless otherwise documented below in the visit note. 

## 2016-01-29 NOTE — Progress Notes (Signed)
   Subjective:    Patient ID: Paul Ryan, male    DOB: [birth year], 70 y.o.   MRN: [id number]  HPI The patient is a 70 YO man coming in for sinus problems. He is leaving out of country starting Friday and is concerned about becoming more ill while there. He is also taking warfarin for past PE. Denies SOB or cough. Congestion and nasal drainage and pressure for the last 7-10 days. Overall stable but not improving. Trying several herbal remedies and they are not helping.   Review of Systems  Constitutional: Positive for activity change. Negative for appetite change, chills, fatigue and fever.  HENT: Positive for congestion, ear pain, postnasal drip, rhinorrhea, sinus pain and sinus pressure. Negative for ear discharge, sore throat and trouble swallowing.   Eyes: Negative.   Respiratory: Negative for cough, chest tightness, shortness of breath and wheezing.   Cardiovascular: Negative.   Gastrointestinal: Negative.   Musculoskeletal: Negative.   Neurological: Positive for light-headedness.      Objective:   Physical Exam  Constitutional: He is oriented to person, place, and time. He appears well-developed and well-nourished.  HENT:  Head: Normocephalic and atraumatic.  Ears with fluid bulging TM, oropharynx with redness and clear drainage, nose with crusting, frontal sinus pressure.   Eyes: EOM are normal.  Neck: Normal range of motion.  Cardiovascular: Normal rate and regular rhythm.   Pulmonary/Chest: Effort normal and breath sounds normal. No respiratory distress. He has no wheezes. He has no rales.  Abdominal: Soft. He exhibits no distension. There is no tenderness. There is no rebound.  Neurological: He is alert and oriented to person, place, and time.  Skin: Skin is warm and dry.   Vitals:   01/29/16 1352  BP: 108/66  Pulse: 64  Resp: 18  Temp: 97.7 F (36.5 C)  TempSrc: Oral  SpO2: 98%  Weight: 160 lb (72.6 kg)  Height: 5\' 11"  (1.803 m)      Assessment & Plan:

## 2016-01-29 NOTE — Patient Instructions (Signed)
We have sent in doxycycline for the sinuses. Take 1 pill twice a day for 5 days (we have sent in 20 pills so take the rest with you and take another course if you get worse while traveling).  I recommend to half your dose of warfarin while taking this medication. So if you take 1 pill daily take 1/2 pill daily.

## 2016-01-29 NOTE — Assessment & Plan Note (Signed)
Rx for doxycycline for the sinuses. He is advised to half his warfarin dosing while taking this medication due to interaction and he would like to verify with his coumadin prescriber which is fine.

## 2016-02-03 ENCOUNTER — Encounter: Payer: Self-pay | Admitting: Family Medicine

## 2016-02-03 DIAGNOSIS — K219 Gastro-esophageal reflux disease without esophagitis: Secondary | ICD-10-CM | POA: Insufficient documentation

## 2016-02-03 HISTORY — DX: Gastro-esophageal reflux disease without esophagitis: K21.9

## 2016-02-03 NOTE — Assessment & Plan Note (Signed)
Tolerating Coumadin. Doing well

## 2016-02-03 NOTE — Assessment & Plan Note (Signed)
Recently switched from [employer] thyroid to Armour thyroid.

## 2016-02-03 NOTE — Assessment & Plan Note (Signed)
Avoid offending foods, start probiotics. Do not eat large meals in late evening and consider raising head of bed. Is following with gastroenterology in [place]

## 2016-02-03 NOTE — Assessment & Plan Note (Signed)
Is doing better off of steroids at the present time

## 2016-02-03 NOTE — Assessment & Plan Note (Signed)
Continue supplements

## 2016-02-03 NOTE — Assessment & Plan Note (Signed)
Tolerating statin, encouraged heart healthy diet, avoid trans fats, minimize simple carbs and saturated fats. Increase exercise as tolerated 

## 2016-02-03 NOTE — Assessment & Plan Note (Signed)
minimize simple carbs. Increase exercise as tolerated.  

## 2016-02-04 ENCOUNTER — Telehealth (INDEPENDENT_AMBULATORY_CARE_PROVIDER_SITE_OTHER): Payer: Medicare Other | Admitting: Pharmacist

## 2016-02-04 DIAGNOSIS — Z7901 Long term (current) use of anticoagulants: Secondary | ICD-10-CM | POA: Diagnosis not present

## 2016-02-04 DIAGNOSIS — I82509 Chronic embolism and thrombosis of unspecified deep veins of unspecified lower extremity: Secondary | ICD-10-CM

## 2016-02-04 DIAGNOSIS — D689 Coagulation defect, unspecified: Secondary | ICD-10-CM

## 2016-02-04 DIAGNOSIS — D6859 Other primary thrombophilia: Secondary | ICD-10-CM

## 2016-02-04 DIAGNOSIS — D688 Other specified coagulation defects: Secondary | ICD-10-CM

## 2016-02-04 DIAGNOSIS — I2782 Chronic pulmonary embolism: Secondary | ICD-10-CM

## 2016-02-04 DIAGNOSIS — I2699 Other pulmonary embolism without acute cor pulmonale: Secondary | ICD-10-CM | POA: Diagnosis not present

## 2016-02-04 LAB — POCT INR: INR: 2.3

## 2016-02-04 NOTE — Patient Instructions (Signed)
Patient educated about medication as defined in this encounter and verbalized understanding by repeating back instructions provided.   

## 2016-02-04 NOTE — Progress Notes (Signed)
Anticoagulation Management Shorty Schoene is a 70 y.o. male who reports to the clinic for monitoring of warfarin treatment.    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]       Comments:   DR [id number]       ASSESSMENT Recent Results: The most recent result is correlated with 26 mg per week: Lab Results  Component Value Date   INR 2.3 02/04/2016   INR 1.9 01/29/2016   INR 2.5 (A) 01/17/2016   PROTIME 15.6 (H) 05/17/2013   Anticoagulation Dosing: INR as of 02/04/2016 and Previous Dosing Information    INR Dt INR Goal [name] [name]   02/04/2016 2.3 2.0-3.0 26 mg 4 mg 4 mg 4 mg 4 mg 4 mg 4 mg 2 mg    Anticoagulation Dose Instructions as of 02/04/2016      Total [name]   New Dose 26 mg 4 mg 4 mg 4 mg 4 mg 4 mg 4 mg 2 mg     (4 mg x 1)  (4 mg x 1)  (4 mg x 1)  (4 mg x 1)  (4 mg x 1)  (4 mg x 1)  (4 mg x 0.5)                           INR today: Therapeutic  PLAN Weekly dose was unchanged   Patient Instructions  Patient educated about medication as defined in this encounter and verbalized understanding by repeating back instructions provided.   Patient advised to contact clinic or seek medical attention if signs/symptoms of bleeding or thromboembolism occur.  Patient verbalized  understanding by repeating back information and was advised to contact me if further medication-related questions arise. Patient was also provided an information handout.  Follow-up 2 days due to antibiotic course  Amaan Meyer J

## 2016-02-19 ENCOUNTER — Telehealth (INDEPENDENT_AMBULATORY_CARE_PROVIDER_SITE_OTHER): Payer: Medicare Other | Admitting: Pharmacist

## 2016-02-19 DIAGNOSIS — I82509 Chronic embolism and thrombosis of unspecified deep veins of unspecified lower extremity: Secondary | ICD-10-CM | POA: Diagnosis not present

## 2016-02-19 DIAGNOSIS — D688 Other specified coagulation defects: Secondary | ICD-10-CM | POA: Diagnosis not present

## 2016-02-19 DIAGNOSIS — I2782 Chronic pulmonary embolism: Secondary | ICD-10-CM | POA: Diagnosis not present

## 2016-02-19 DIAGNOSIS — Z7901 Long term (current) use of anticoagulants: Secondary | ICD-10-CM

## 2016-02-19 DIAGNOSIS — D6859 Other primary thrombophilia: Secondary | ICD-10-CM

## 2016-02-19 DIAGNOSIS — D689 Coagulation defect, unspecified: Secondary | ICD-10-CM

## 2016-02-19 LAB — POCT INR: INR: 2.5

## 2016-02-19 NOTE — Progress Notes (Signed)
Anticoagulation Management Paul Ryan is a 70 y.o. male who reports to the clinic for monitoring of warfarin treatment.    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  Anticoagulation Clinic Visit History: Patient does not report signs/symptoms of bleeding or thromboembolism   Anticoagulation Episode Summary    Current INR goal:   2.0-3.0  TTR:   65.2 % (1.5 y)  Next INR check:   03/04/2016  INR from last check:   2.5 (02/19/2016)  Weekly max dose:     Target end date:     INR check location:     Preferred lab:     Send INR reminders to:      Indications   Chronic anticoagulation [Z79.01] Coagulopathy ([place]) [[name]] DVT (deep venous thrombosis) ([name]) [[name]] Pulmonary embolism ([name]) [[name]] 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       Anticoagulation Dose Instructions as of 02/19/2016      Total [name]   New Dose 28 mg 4 mg 4 mg 4 mg 4 mg 4 mg 4 mg 4 mg     (4 mg x 1)  (4 mg x 1)  (4 mg x 1)  (4 mg x 1)  (4 mg x 1)  (4 mg x 1)  (4 mg x 1)                           INR today: Therapeutic  PLAN Weekly dose was unchanged  Patient Instructions  Patient educated about medication as defined in this encounter and verbalized understanding by repeating back instructions provided.   Patient advised to contact clinic or seek medical attention if signs/symptoms of bleeding or thromboembolism occur.  Patient verbalized  understanding by repeating back information and was advised to contact me if further medication-related questions arise. Patient was also provided an information handout.  Follow-up 2 weeks  Paul Ryan

## 2016-02-19 NOTE — Patient Instructions (Signed)
Patient educated about medication as defined in this encounter and verbalized understanding by repeating back instructions provided.   

## 2016-03-12 ENCOUNTER — Telehealth (INDEPENDENT_AMBULATORY_CARE_PROVIDER_SITE_OTHER): Payer: Medicare Other | Admitting: Pharmacist

## 2016-03-12 DIAGNOSIS — I2782 Chronic pulmonary embolism: Secondary | ICD-10-CM | POA: Diagnosis not present

## 2016-03-12 DIAGNOSIS — I82509 Chronic embolism and thrombosis of unspecified deep veins of unspecified lower extremity: Secondary | ICD-10-CM | POA: Diagnosis not present

## 2016-03-12 DIAGNOSIS — Z7901 Long term (current) use of anticoagulants: Secondary | ICD-10-CM | POA: Diagnosis not present

## 2016-03-12 DIAGNOSIS — D688 Other specified coagulation defects: Secondary | ICD-10-CM | POA: Diagnosis not present

## 2016-03-12 DIAGNOSIS — D6859 Other primary thrombophilia: Secondary | ICD-10-CM

## 2016-03-12 DIAGNOSIS — D689 Coagulation defect, unspecified: Secondary | ICD-10-CM

## 2016-03-12 LAB — POCT INR: INR: 3.3

## 2016-03-12 NOTE — Telephone Encounter (Signed)
Reviewed Thanks DrG 

## 2016-03-12 NOTE — Progress Notes (Signed)
Anticoagulation Management Paul Ryan is a 71 y.o. male who reports to the clinic for monitoring of warfarin treatment.    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 Supervising Physician: Murriel Hopper  Anticoagulation Clinic Visit History: Patient does not report signs/symptoms of thromboembolism. Patient does report bruising described as   Anticoagulation Episode Summary    Current INR goal:   2.0-3.0  TTR:   65.1 % (1.6 y)  Next INR check:   03/19/2016  INR from last check:   3.3! 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       Anticoagulation Dose Instructions as of 03/12/2016      Total [name]   New Dose 22 mg 4 mg 4 mg 4 mg 2 mg 2 mg 4 mg 2 mg     (4 mg x 1)  (4 mg x 1)  (4 mg x 1)  (4 mg x 0.5)  (4 mg x 0.5)  (4 mg x 1)  (4 mg x 0.5)                           INR today: Supratherapeutic  PLAN Weekly dose was decreased by 8% to 22 mg per week due to patient-reported bruising  Patient Instructions  Patient educated about medication as defined in this encounter and verbalized understanding by repeating back instructions provided.    Patient advised to contact clinic or seek medical attention if signs/symptoms of bleeding or thromboembolism occur.  Patient verbalized understanding by repeating back information and was advised to contact me if further medication-related questions arise. Patient was also provided an information handout.  Follow-up Office visit in 1 week  [place]

## 2016-03-12 NOTE — Patient Instructions (Signed)
Patient educated about medication as defined in this encounter and verbalized understanding by repeating back instructions provided.   

## 2016-03-19 ENCOUNTER — Ambulatory Visit: Payer: Medicare Other | Admitting: Pharmacist

## 2016-03-25 ENCOUNTER — Ambulatory Visit (INDEPENDENT_AMBULATORY_CARE_PROVIDER_SITE_OTHER): Payer: Medicare Other | Admitting: Pharmacist

## 2016-03-25 DIAGNOSIS — D6859 Other primary thrombophilia: Secondary | ICD-10-CM

## 2016-03-25 DIAGNOSIS — D689 Coagulation defect, unspecified: Secondary | ICD-10-CM

## 2016-03-25 DIAGNOSIS — D688 Other specified coagulation defects: Secondary | ICD-10-CM

## 2016-03-25 DIAGNOSIS — I82509 Chronic embolism and thrombosis of unspecified deep veins of unspecified lower extremity: Secondary | ICD-10-CM | POA: Diagnosis not present

## 2016-03-25 DIAGNOSIS — Z7901 Long term (current) use of anticoagulants: Secondary | ICD-10-CM

## 2016-03-25 LAB — POCT INR: INR: 2.2

## 2016-03-25 NOTE — Patient Instructions (Signed)
Patient educated about medication as defined in this encounter and verbalized understanding by repeating back instructions provided.   

## 2016-03-26 ENCOUNTER — Ambulatory Visit: Payer: Medicare Other | Admitting: Pharmacist

## 2016-03-29 ENCOUNTER — Encounter: Payer: Self-pay | Admitting: Family Medicine

## 2016-04-13 ENCOUNTER — Encounter: Payer: Self-pay | Admitting: Family Medicine

## 2016-04-18 ENCOUNTER — Encounter: Payer: Self-pay | Admitting: Pharmacist

## 2016-04-18 DIAGNOSIS — D6859 Other primary thrombophilia: Secondary | ICD-10-CM

## 2016-04-18 DIAGNOSIS — I2699 Other pulmonary embolism without acute cor pulmonale: Secondary | ICD-10-CM

## 2016-04-18 DIAGNOSIS — I82509 Chronic embolism and thrombosis of unspecified deep veins of unspecified lower extremity: Secondary | ICD-10-CM

## 2016-04-18 DIAGNOSIS — D689 Coagulation defect, unspecified: Secondary | ICD-10-CM

## 2016-04-18 DIAGNOSIS — Z7901 Long term (current) use of anticoagulants: Secondary | ICD-10-CM

## 2016-04-18 LAB — POCT INR: INR: 2.7

## 2016-04-18 NOTE — Patient Instructions (Signed)
Patient educated about medication as defined in this encounter and verbalized understanding by repeating back instructions provided.   

## 2016-04-18 NOTE — Progress Notes (Signed)
Anticoagulation Management Paul Ryan is a 71 y.o. male who reports to the clinic for monitoring of warfarin treatment.    Indication: DVT and PE history, protein S deficiency Duration: indefinite Supervising physician: Murriel Hopper  Anticoagulation Clinic Visit History: Patient does not report signs/symptoms of bleeding or thromboembolism   Anticoagulation Episode Summary    Current INR goal:   2.0-3.0  TTR:   66.7 % (1.7 y)  Next INR check:   05/16/2016  INR from last check:   2.7 (04/18/2016)  Weekly max dose:     Target end date:     INR check location:     Preferred lab:     Send INR reminders to:      Indications   Chronic anticoagulation [Z79.01] Coagulopathy ([place]) [[name]] DVT (deep venous thrombosis) ([name]) [[name]] Pulmonary embolism ([name]) [[name]] 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]       Comments:   DR [id number]       ASSESSMENT Recent Results: The most recent result is correlated with 24 mg per week: Lab Results  Component Value Date   INR 2.7 04/18/2016   INR 2.2 03/25/2016   INR 3.3 03/12/2016   PROTIME 15.6 (H) 05/17/2013   Anticoagulation Dosing: INR as of 04/18/2016 and Previous Dosing Information    INR Dt INR Goal [name] [name]   04/18/2016 2.7 2.0-3.0 24 mg 4 mg 4 mg 4 mg 2 mg 4 mg 4 mg 2 mg    Anticoagulation Dose Instructions as of 04/18/2016      Total [name]   New Dose 24 mg 4 mg 4 mg 4 mg 2 mg 4 mg 4 mg 2 mg     (4 mg x 1)  (4 mg x 1)  (4 mg x 1)  (4 mg x 0.5)  (4 mg x 1)  (4 mg x 1)  (4 mg x 0.5)                           INR today: Therapeutic  PLAN Weekly dose was unchanged  Patient Instructions  Patient educated about medication as defined in this encounter and verbalized understanding by repeating back instructions provided.   Patient advised to contact clinic or seek medical attention if signs/symptoms of bleeding or thromboembolism occur.  Patient verbalized  understanding by repeating back information and was advised to contact me if further medication-related questions arise. Patient was also provided an information handout.  Follow-up 4 weeks  Flossie Paul Ryan

## 2016-04-21 ENCOUNTER — Other Ambulatory Visit (INDEPENDENT_AMBULATORY_CARE_PROVIDER_SITE_OTHER): Payer: Medicare Other

## 2016-04-21 DIAGNOSIS — R739 Hyperglycemia, unspecified: Secondary | ICD-10-CM

## 2016-04-21 DIAGNOSIS — E559 Vitamin D deficiency, unspecified: Secondary | ICD-10-CM

## 2016-04-21 DIAGNOSIS — E039 Hypothyroidism, unspecified: Secondary | ICD-10-CM

## 2016-04-21 DIAGNOSIS — D689 Coagulation defect, unspecified: Secondary | ICD-10-CM | POA: Diagnosis not present

## 2016-04-21 DIAGNOSIS — M353 Polymyalgia rheumatica: Secondary | ICD-10-CM | POA: Diagnosis not present

## 2016-04-21 DIAGNOSIS — E785 Hyperlipidemia, unspecified: Secondary | ICD-10-CM | POA: Diagnosis not present

## 2016-04-21 LAB — COMPREHENSIVE METABOLIC PANEL
ALK PHOS: 46 U/L (ref 39–117)
ALT: 23 U/L (ref 0–53)
AST: 22 U/L (ref 0–37)
Albumin: 4 g/dL (ref 3.5–5.2)
BILIRUBIN TOTAL: 0.6 mg/dL (ref 0.2–1.2)
BUN: 14 mg/dL (ref 6–23)
CALCIUM: 9.1 mg/dL (ref 8.4–10.5)
CO2: 27 mEq/L (ref 19–32)
Chloride: 105 mEq/L (ref 96–112)
Creatinine, Ser: 0.98 mg/dL (ref 0.40–1.50)
GFR: 79.99 mL/min (ref >60.00)
Glucose, Bld: 91 mg/dL (ref 70–99)
POTASSIUM: 4 meq/L (ref 3.5–5.1)
Sodium: 140 mEq/L (ref 135–145)
TOTAL PROTEIN: 7.3 g/dL (ref 6.0–8.3)

## 2016-04-21 LAB — CBC
HEMATOCRIT: 45.7 % (ref 39.0–52.0)
HEMOGLOBIN: 15.4 g/dL (ref 13.0–17.0)
MCHC: 33.8 g/dL (ref 30.0–36.0)
MCV: 90.1 fl (ref 78.0–100.0)
PLATELETS: 219 10*3/uL (ref 150.0–400.0)
RBC: 5.07 Mil/uL (ref 4.22–5.81)
RDW: 14.3 % (ref 11.5–15.5)
WBC: 6.2 10*3/uL (ref 4.0–10.5)

## 2016-04-21 LAB — SEDIMENTATION RATE: Sed Rate: 10 mm/hr (ref 0–20)

## 2016-04-21 LAB — LIPID PANEL
CHOLESTEROL: 210 mg/dL — AB (ref 0–200)
HDL: 37.9 mg/dL — ABNORMAL LOW (ref >39.00)
LDL Cholesterol: 141 mg/dL — ABNORMAL HIGH (ref 0–99)
NonHDL: 172.12
TRIGLYCERIDES: 157 mg/dL — AB (ref 0.0–149.0)
Total CHOL/HDL Ratio: 6
VLDL: 31.4 mg/dL (ref 0.0–40.0)

## 2016-04-21 LAB — T4, FREE: FREE T4: 0.77 ng/dL (ref 0.60–1.60)

## 2016-04-21 LAB — HEMOGLOBIN A1C: Hgb A1c MFr Bld: 6.2 % (ref 4.6–6.5)

## 2016-04-21 LAB — VITAMIN D 25 HYDROXY (VIT D DEFICIENCY, FRACTURES): VITD: 66.61 ng/mL (ref 30.00–100.00)

## 2016-04-21 LAB — TSH: TSH: 0.11 u[IU]/mL — AB (ref 0.35–4.50)

## 2016-04-21 NOTE — Progress Notes (Signed)
Reviewed Thanks DrG 

## 2016-04-24 ENCOUNTER — Encounter: Payer: Self-pay | Admitting: Family Medicine

## 2016-04-24 ENCOUNTER — Other Ambulatory Visit (HOSPITAL_COMMUNITY)
Admission: RE | Admit: 2016-04-24 | Discharge: 2016-04-24 | Disposition: A | Discharge status: Home / Self Care | Payer: Medicare Other | Source: Ambulatory Visit | Attending: Family Medicine | Admitting: Family Medicine

## 2016-04-24 ENCOUNTER — Ambulatory Visit (INDEPENDENT_AMBULATORY_CARE_PROVIDER_SITE_OTHER): Payer: Medicare Other | Admitting: Family Medicine

## 2016-04-24 VITALS — BP 108/60 | HR 65 | Temp 97.6°F | Wt 166.0 lb

## 2016-04-24 DIAGNOSIS — R350 Frequency of micturition: Secondary | ICD-10-CM | POA: Diagnosis not present

## 2016-04-24 DIAGNOSIS — Z7901 Long term (current) use of anticoagulants: Secondary | ICD-10-CM

## 2016-04-24 DIAGNOSIS — R300 Dysuria: Secondary | ICD-10-CM

## 2016-04-24 DIAGNOSIS — E1065 Type 1 diabetes mellitus with hyperglycemia: Secondary | ICD-10-CM

## 2016-04-24 DIAGNOSIS — E559 Vitamin D deficiency, unspecified: Secondary | ICD-10-CM | POA: Diagnosis not present

## 2016-04-24 DIAGNOSIS — E785 Hyperlipidemia, unspecified: Secondary | ICD-10-CM

## 2016-04-24 DIAGNOSIS — R739 Hyperglycemia, unspecified: Secondary | ICD-10-CM | POA: Diagnosis not present

## 2016-04-24 DIAGNOSIS — Z113 Encounter for screening for infections with a predominantly sexual mode of transmission: Secondary | ICD-10-CM | POA: Insufficient documentation

## 2016-04-24 DIAGNOSIS — E039 Hypothyroidism, unspecified: Secondary | ICD-10-CM

## 2016-04-24 DIAGNOSIS — M19041 Primary osteoarthritis, right hand: Secondary | ICD-10-CM

## 2016-04-24 DIAGNOSIS — G4700 Insomnia, unspecified: Secondary | ICD-10-CM | POA: Insufficient documentation

## 2016-04-24 HISTORY — DX: Insomnia, unspecified: G47.00

## 2016-04-24 NOTE — Assessment & Plan Note (Signed)
Doing well asymptomatic on Armour Thyroid, tsh suppressed but free T4 at low end of normal so will continue to monitor without change in dosing today

## 2016-04-24 NOTE — Assessment & Plan Note (Signed)
Encouraged good sleep hygiene such as dark, quiet room. No blue/green glowing lights such as computer screens in bedroom. No alcohol or stimulants in evening. Cut down on caffeine as able. Regular exercise is helpful but not just prior to bed time. May try Melatonin and/or L Tryptophan, call if worsens.

## 2016-04-24 NOTE — Assessment & Plan Note (Signed)
Continues on Coumadin, follows with Hematology and doing well

## 2016-04-24 NOTE — Assessment & Plan Note (Signed)
Improved check cbc with next blood draw

## 2016-04-24 NOTE — Assessment & Plan Note (Signed)
Tolerating statin, encouraged heart healthy diet, avoid trans fats, minimize simple carbs and saturated fats. Increase exercise as tolerated 

## 2016-04-24 NOTE — Progress Notes (Signed)
Pre visit review using our clinic review tool, if applicable. No additional management support is needed unless otherwise documented below in the visit note. 

## 2016-04-24 NOTE — Progress Notes (Signed)
Patient ID: Paul Ryan, male   DOB: [birth year], 71 y.o.   MRN: [id number]   Subjective:    Patient ID: Paul Ryan, male    DOB: May 03, 1945, 71 y.o.   MRN: [id number]  Chief Complaint  Patient presents with  . Hyperglycemia    Hyperglycemia  This is a chronic problem. Pertinent negatives include no chest pain, congestion, coughing, fever, headaches, rash or vomiting. He has tried rest, relaxation and eating for the symptoms.    Patient is in today for a follow up on hyperglycemia. Patient also has a Hx of DVT, acid reflux, hypothyroid. Patient has no acute concerns noted at this time. 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 He had to return early from his work in [place] due to his wife's new diagnosis of appendiceal cancer. No other acute complaints or recent hospitalizations. Denies CP/palp/SOB/HA/congestion/fevers/GI or GU c/o. Taking meds as prescribed  I acted as a Education administrator for Penni Homans, [place], [place]   Past Medical History:  Diagnosis Date  . Acid reflux 02/03/2016  . Allergy    seasonal  . Chronic anticoagulation 06/14/2013  . Coagulopathy ([place]) 05/16/2011  . Collagen vascular disease ([place])   . DVT (deep venous thrombosis) ([name])    takes coumadin  . Dysuria 02/27/2013  . 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 Hyperglycemia 12/26/2014  . Hyperlipidemia   . Hypothyroid 06/10/2011  . Left hip pain 12/17/2010  . Polymyalgia ([place]) 03/21/2014  . 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 Preventative health care 12/17/2010  . Shingles 1982   right abdominal wall  . Thyroid nodule 11/29/2014  . Vitamin D deficiency 10/14/2011    Past Surgical History:  Procedure Laterality Date  . 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 Cancer Mother    unknown- everywhere  . Cancer Sister     breast    Social History   Social History  . Marital status: Married    Spouse name: N/A  . Number of children: N/A  . Years of education: N/A   Occupational History  . Not on file.   Social History Main Topics  . Smoking status: Former Smoker    Packs/day: 1.00    Years: 20.00    Types: Cigarettes    Quit date: 03/03/1980  . Smokeless tobacco: Never Used  . Alcohol use No  . Drug use: No  . Sexual activity: Not on file   Other Topics Concern  . Not on file   Social History Narrative  . No narrative on file    Outpatient Medications Prior to Visit  Medication Sig Dispense Refill  . albuterol (PROVENTIL HFA;VENTOLIN HFA) 108 (90 Base) MCG/ACT inhaler Inhale 2 puffs into the lungs every 4 (four) hours as needed for wheezing or shortness of breath. 1 Inhaler 3  . alendronate (FOSAMAX) 70 MG tablet Take 1 tablet (70 mg total) by mouth every 7 (seven) days. Take with a full glass of water on an empty stomach. 4 tablet 11  . b complex vitamins tablet Take 1 tablet by mouth daily.      . Cholecalciferol 4000 UNITS TABS Take 1 tablet by mouth daily.    . Coenzyme Q10 (COQ10 PO)  Take 2 tablets by mouth daily.    [name] Kitchen doxycycline (VIBRA-TABS) 100 MG tablet Take 1 tablet (100 mg total) by mouth 2 (two) times daily. 20 tablet 0  . famotidine (PEPCID) 40 MG tablet Take 40 mg by mouth daily.    Javier Docker Oil 300 MG CAPS 1 cap daily    . NIACIN PO Take 1 tablet by mouth 3 (three) times daily.     . Red Yeast Rice Extract 600 MG CAPS Take 3 capsules by mouth daily.    . rosuvastatin (CRESTOR) 20 MG tablet TAKE 1 TABLET (20 MG TOTAL) BY MOUTH AT BEDTIME. 90 tablet 1  . tacrolimus (PROTOPIC) 0.1 % ointment Apply topically 2 (two) times daily. 100 g 1  . thyroid (ARMOUR THYROID) 60 MG tablet Take 1 tablet by mouth as directed alternating with the 90 mg 45 tablet 2  . thyroid (ARMOUR THYROID) 90 MG tablet Take 1 tablet by mouth as directed alternating  with the 60 mg 45 tablet 2  . warfarin (COUMADIN) 4 MG tablet Take 1 tablet (4 mg total) by mouth daily. Take 1/2 tablet Wednesdays and Saturdays 54 tablet 3   No facility-administered medications prior to visit.     No Known Allergies  Review of Systems  Constitutional: Negative for fever and malaise/fatigue.  HENT: Negative for congestion.   Eyes: Negative for blurred vision.  Respiratory: Negative for cough and shortness of breath.   Cardiovascular: Negative for chest pain, palpitations and leg swelling.  Gastrointestinal: Negative for vomiting.  Musculoskeletal: Positive for joint pain. Negative for back pain.  Skin: Negative for rash.  Neurological: Negative for loss of consciousness and headaches.  Psychiatric/Behavioral: The patient is nervous/anxious.        Objective:    Physical Exam  Constitutional: He is oriented to person, place, and time. He appears well-developed and well-nourished. No distress.  HENT:  Head: Normocephalic and atraumatic.  Eyes: Conjunctivae are normal.  Neck: Normal range of motion. No thyromegaly present.  Cardiovascular: Normal rate.   Pulmonary/Chest: Effort normal and breath sounds normal. He has no wheezes.  Abdominal: Soft. Bowel sounds are normal. There is no tenderness.  Musculoskeletal: He exhibits no edema or deformity.  Neurological: He is alert and oriented to person, place, and time.  Skin: Skin is warm and dry. He is not diaphoretic.  4th finger casted on right  Psychiatric: He has a normal mood and affect.    There were no vitals taken for this visit. Wt Readings from Last 3 Encounters:  01/29/16 160 lb (72.6 kg)  01/22/16 163 lb 6.4 oz (74.1 kg)  01/07/16 163 lb (73.9 kg)     Lab Results  Component Value Date   WBC 6.2 04/21/2016   HGB 15.4 04/21/2016   HCT 45.7 04/21/2016   PLT 219.0 04/21/2016   GLUCOSE 91 04/21/2016   CHOL 210 (H) 04/21/2016   TRIG 157.0 (H) 04/21/2016   HDL 37.90 (L) 04/21/2016   LDLDIRECT  158.0 03/11/2013   LDLCALC 141 (H) 04/21/2016   ALT 23 04/21/2016   AST 22 04/21/2016   NA 140 04/21/2016   K 4.0 04/21/2016   CL 105 04/21/2016   CREATININE 0.98 04/21/2016   BUN 14 04/21/2016   CO2 27 04/21/2016   TSH 0.11 (L) 04/21/2016   PSA 1.63 10/14/2011   INR 2.7 04/18/2016   HGBA1C 6.2 04/21/2016    Lab Results  Component Value Date   TSH 0.11 (L) 04/21/2016   Lab Results  Component  Value Date   WBC 6.2 04/21/2016   HGB 15.4 04/21/2016   HCT 45.7 04/21/2016   MCV 90.1 04/21/2016   PLT 219.0 04/21/2016   Lab Results  Component Value Date   NA 140 04/21/2016   K 4.0 04/21/2016   CHLORIDE 109 11/16/2012   CO2 27 04/21/2016   GLUCOSE 91 04/21/2016   BUN 14 04/21/2016   CREATININE 0.98 04/21/2016   BILITOT 0.6 04/21/2016   ALKPHOS 46 04/21/2016   AST 22 04/21/2016   ALT 23 04/21/2016   PROT 7.3 04/21/2016   ALBUMIN 4.0 04/21/2016   CALCIUM 9.1 04/21/2016   ANIONGAP 8 09/11/2014   GFR 79.99 04/21/2016   Lab Results  Component Value Date   CHOL 210 (H) 04/21/2016   Lab Results  Component Value Date   HDL 37.90 (L) 04/21/2016   Lab Results  Component Value Date   LDLCALC 141 (H) 04/21/2016   Lab Results  Component Value Date   TRIG 157.0 (H) 04/21/2016   Lab Results  Component Value Date   CHOLHDL 6 04/21/2016   Lab Results  Component Value Date   HGBA1C 6.2 04/21/2016       Assessment & Plan:   Problem List Items Addressed This Visit    None      I am having Mr. Bate maintain his b complex vitamins, Krill Oil, tacrolimus, NIACIN PO, Cholecalciferol, Coenzyme Q10 (COQ10 PO), albuterol, rosuvastatin, famotidine, alendronate, warfarin, Red Yeast Rice Extract, thyroid, thyroid, and doxycycline.  No orders of the defined types were placed in this encounter.   CMA served as Education administrator during this visit. History, Physical and Plan performed by medical provider. Documentation and orders reviewed and attested to.  Shan Levans, CMA

## 2016-04-24 NOTE — Patient Instructions (Addendum)
Melatonin 2 to 10 mg at bed L Tryptophan at bed Insomnia Insomnia is a sleep disorder that makes it difficult to fall asleep or to stay asleep. Insomnia can cause tiredness (fatigue), low energy, difficulty concentrating, mood swings, and poor performance at work or school. There are three different ways to classify insomnia:  Difficulty falling asleep.  Difficulty staying asleep.  Waking up too early in the morning. Any type of insomnia can be long-term (chronic) or short-term (acute). Both are common. Short-term insomnia usually lasts for three months or less. Chronic insomnia occurs at least three times a week for longer than three months. What are the causes? Insomnia may be caused by another condition, situation, or substance, such as:  Anxiety.  Certain medicines.  Gastroesophageal reflux disease (GERD) or other gastrointestinal conditions.  Asthma or other breathing conditions.  Restless legs syndrome, sleep apnea, or other sleep disorders.  Chronic pain.  Menopause. This may include hot flashes.  Stroke.  Abuse of alcohol, tobacco, or illegal drugs.  Depression.  Caffeine.  Neurological disorders, such as Alzheimer disease.  An overactive thyroid (hyperthyroidism). The cause of insomnia may not be known. What increases the risk? Risk factors for insomnia include:  Gender. Women are more commonly affected than men.  Age. Insomnia is more common as you get older.  Stress. This may involve your professional or personal life.  Income. Insomnia is more common in people with lower income.  Lack of exercise.  Irregular work schedule or night shifts.  Traveling between different time zones. What are the signs or symptoms? If you have insomnia, trouble falling asleep or trouble staying asleep is the main symptom. This may lead to other symptoms, such as:  Feeling fatigued.  Feeling nervous about going to sleep.  Not feeling rested in the  morning.  Having trouble concentrating.  Feeling irritable, anxious, or depressed. How is this treated? Treatment for insomnia depends on the cause. If your insomnia is caused by an underlying condition, treatment will focus on addressing the condition. Treatment may also include:  Medicines to help you sleep.  Counseling or therapy.  Lifestyle adjustments. Follow these instructions at home:  Take medicines only as directed by your health care provider.  Keep regular sleeping and waking hours. Avoid naps.  Keep a sleep diary to help you and your health care provider figure out what could be causing your insomnia. Include:  When you sleep.  When you wake up during the night.  How well you sleep.  How rested you feel the next day.  Any side effects of medicines you are taking.  What you eat and drink.  Make your bedroom a comfortable place where it is easy to fall asleep:  Put up shades or special blackout curtains to block light from outside.  Use a white noise machine to block noise.  Keep the temperature cool.  Exercise regularly as directed by your health care provider. Avoid exercising right before bedtime.  Use relaxation techniques to manage stress. Ask your health care provider to suggest some techniques that may work well for you. These may include:  Breathing exercises.  Routines to release muscle tension.  Visualizing peaceful scenes.  Cut back on alcohol, caffeinated beverages, and cigarettes, especially close to bedtime. These can disrupt your sleep.  Do not overeat or eat spicy foods right before bedtime. This can lead to digestive discomfort that can make it hard for you to sleep.  Limit screen use before bedtime. This includes:  Watching TV.  Using your smartphone, tablet, and computer.  Stick to a routine. This can help you fall asleep faster. Try to do a quiet activity, brush your teeth, and go to bed at the same time each night.  Get out  of bed if you are still awake after 15 minutes of trying to sleep. Keep the lights down, but try reading or doing a quiet activity. When you feel sleepy, go back to bed.  Make sure that you drive carefully. Avoid driving if you feel very sleepy.  Keep all follow-up appointments as directed by your health care provider. This is important. Contact a health care provider if:  You are tired throughout the day or have trouble in your daily routine due to sleepiness.  You continue to have sleep problems or your sleep problems get worse. Get help right away if:  You have serious thoughts about hurting yourself or someone else. This information is not intended to replace advice given to you by your health care provider. Make sure you discuss any questions you have with your health care provider. Document Released: 02/15/2000 Document Revised: 07/20/2015 Document Reviewed: 11/18/2013 Elsevier Interactive Patient Education  2017 [employer]   Cholesterol Cholesterol is a white, waxy, fat-like substance that is needed by the human body in small amounts. The liver makes all the cholesterol we need. Cholesterol is carried from the liver by the blood through the blood vessels. Deposits of cholesterol (plaques) may build up on blood vessel (artery) walls. Plaques make the arteries narrower and stiffer. Cholesterol plaques increase the risk for heart attack and stroke. You cannot feel your cholesterol level even if it is very high. The only way to know that it is high is to have a blood test. Once you know your cholesterol levels, you should keep a record of the test results. Work with your health care provider to keep your levels in the desired range. What do the results mean?  Total cholesterol is a rough measure of all the cholesterol in your blood.  LDL (low-density lipoprotein) is the "bad" cholesterol. This is the type that causes plaque to build up on the artery walls. You want this level to be  low.  HDL (high-density lipoprotein) is the "good" cholesterol because it cleans the arteries and carries the LDL away. You want this level to be high.  Triglycerides are fat that the body can either burn for energy or store. High levels are closely linked to heart disease. What are the desired levels of cholesterol?  Total cholesterol below 200.  LDL below 100 for people who are at risk, below 70 for people at very high risk.  HDL above 40 is good. A level of 60 or higher is considered to be protective against heart disease.  Triglycerides below 150. How can I lower my cholesterol? Diet  Follow your diet program as told by your health care provider.  Choose fish or white meat chicken and [place], roasted or baked. Limit fatty cuts of red meat, fried foods, and processed meats, such as sausage and lunch meats.  Eat lots of fresh fruits and vegetables.  Choose whole grains, beans, pasta, potatoes, and cereals.  Choose olive oil, corn oil, or canola oil, and use only small amounts.  Avoid butter, mayonnaise, shortening, or palm kernel oils.  Avoid foods with trans fats.  Drink skim or nonfat milk and eat low-fat or nonfat yogurt and cheeses. Avoid whole milk, cream, ice cream, egg yolks, and full-fat cheeses.  Healthier desserts  include angel food cake, ginger snaps, animal crackers, hard candy, popsicles, and low-fat or nonfat frozen yogurt. Avoid pastries, cakes, pies, and cookies. Exercise  Follow your exercise program as told by your health care provider. A regular program:  Helps to decrease LDL and raise HDL.  Helps with weight control.  Do things that increase your activity level, such as gardening, walking, and taking the stairs.  Ask your health care provider about ways that you can be more active in your daily life. Medicine  Take over-the-counter and prescription medicines only as told by your health care provider.  Medicine may be prescribed by your health  care provider to help lower cholesterol and decrease the risk for heart disease. This is usually done if diet and exercise have failed to bring down cholesterol levels.  If you have several risk factors, you may need medicine even if your levels are normal. This information is not intended to replace advice given to you by your health care provider. Make sure you discuss any questions you have with your health care provider. Document Released: 11/12/2000 Document Revised: 09/15/2015 Document Reviewed: 08/18/2015 Elsevier Interactive Patient Education  2017 [employer].

## 2016-04-24 NOTE — Assessment & Plan Note (Signed)
hgba1c acceptable, minimize simple carbs. Increase exercise as tolerated. Continue current meds 

## 2016-04-24 NOTE — Assessment & Plan Note (Signed)
WNL, continue supplements 

## 2016-04-24 NOTE — Assessment & Plan Note (Signed)
Is now following with Dr Amedeo Plenty with a cast in place. Try Lidocaine gel and massage bid when cast is off

## 2016-04-28 LAB — URINE CYTOLOGY ANCILLARY ONLY
Chlamydia: NEGATIVE
Neisseria Gonorrhea: NEGATIVE
TRICH (WINDOWPATH): NEGATIVE

## 2016-04-28 LAB — PROTIME-INR

## 2016-04-29 ENCOUNTER — Telehealth: Payer: Self-pay | Admitting: *Deleted

## 2016-04-29 NOTE — Telephone Encounter (Signed)
AWV scheduled for 07/22/16 @4 .

## 2016-04-30 LAB — URINE CYTOLOGY ANCILLARY ONLY
Bacterial vaginitis: NEGATIVE
Candida vaginitis: NEGATIVE

## 2016-05-01 ENCOUNTER — Encounter: Payer: Self-pay | Admitting: Family Medicine

## 2016-05-02 ENCOUNTER — Other Ambulatory Visit: Payer: Self-pay | Admitting: Family Medicine

## 2016-05-02 MED ORDER — CEFDINIR 300 MG PO CAPS: 300.0000 mg | ORAL_CAPSULE | Freq: Two times a day (BID) | ORAL | 0 refills | Status: DC

## 2016-05-07 ENCOUNTER — Telehealth (INDEPENDENT_AMBULATORY_CARE_PROVIDER_SITE_OTHER): Payer: Medicare Other | Admitting: Pharmacist

## 2016-05-07 DIAGNOSIS — D689 Coagulation defect, unspecified: Secondary | ICD-10-CM

## 2016-05-07 DIAGNOSIS — I2699 Other pulmonary embolism without acute cor pulmonale: Secondary | ICD-10-CM

## 2016-05-07 DIAGNOSIS — I82509 Chronic embolism and thrombosis of unspecified deep veins of unspecified lower extremity: Secondary | ICD-10-CM | POA: Diagnosis not present

## 2016-05-07 DIAGNOSIS — D688 Other specified coagulation defects: Secondary | ICD-10-CM

## 2016-05-07 DIAGNOSIS — D6859 Other primary thrombophilia: Secondary | ICD-10-CM

## 2016-05-07 DIAGNOSIS — Z7901 Long term (current) use of anticoagulants: Secondary | ICD-10-CM

## 2016-05-07 LAB — POCT INR: INR: 3

## 2016-05-07 NOTE — Patient Instructions (Signed)
Patient educated about medication as defined in this encounter and verbalized understanding by repeating back instructions provided.   

## 2016-05-07 NOTE — Progress Notes (Signed)
Anticoagulation Management Paul Ryan is a 71 y.o. male who is being followed up for warfarin treatment.    Indication: DVT, PE and protein S deficiency  Duration: indefinite Supervising physician: Murriel Hopper  Anticoagulation Clinic Visit History: Patient does not report signs/symptoms of bleeding or thromboembolism. 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]       Comments:   DR [id number]       ASSESSMENT Recent Results: The most recent result is correlated with 24 mg per week: Lab Results  Component Value Date   INR 3.0 05/07/2016   INR 2.7 04/18/2016   INR 2.2 03/25/2016   PROTIME 15.6 (H) 05/17/2013   Anticoagulation Dosing: INR as of 05/07/2016 and Previous Dosing Information    INR Dt INR Goal [name] [name]   05/07/2016 3.0 2.0-3.0 24 mg 4 mg 4 mg 4 mg 2 mg 4 mg 4 mg 2 mg    Anticoagulation Dose Instructions as of 05/07/2016      Total [name]   New Dose 24 mg 4 mg 4 mg 4 mg 2 mg 4 mg 4 mg 2 mg     (4 mg x 1)  (4 mg x 1)  (4 mg x 1)  (4 mg x 0.5)  (4 mg x 1)  (4 mg x 1)  (4 mg x 0.5)                           INR today: Therapeutic  PLAN Weekly dose was unchanged but patient was advised to follow up within a few days due to being on cefdinir  There are no Patient Instructions on file for this visit. Patient advised to contact clinic or seek medical attention if signs/symptoms of bleeding or thromboembolism occur.  Patient verbalized  understanding by repeating back information and was advised to contact me if further medication-related questions arise. Patient was also provided an information handout.  Follow-up 2 days  [name]

## 2016-05-08 NOTE — Telephone Encounter (Signed)
INR 3 Continue same dose Thx DrG

## 2016-05-12 ENCOUNTER — Telehealth (INDEPENDENT_AMBULATORY_CARE_PROVIDER_SITE_OTHER): Payer: Medicare Other | Admitting: Pharmacist

## 2016-05-12 DIAGNOSIS — I2699 Other pulmonary embolism without acute cor pulmonale: Secondary | ICD-10-CM

## 2016-05-12 DIAGNOSIS — I82509 Chronic embolism and thrombosis of unspecified deep veins of unspecified lower extremity: Secondary | ICD-10-CM

## 2016-05-12 DIAGNOSIS — D6859 Other primary thrombophilia: Secondary | ICD-10-CM | POA: Diagnosis not present

## 2016-05-12 DIAGNOSIS — Z7901 Long term (current) use of anticoagulants: Secondary | ICD-10-CM

## 2016-05-12 DIAGNOSIS — D689 Coagulation defect, unspecified: Secondary | ICD-10-CM

## 2016-05-12 LAB — POCT INR: INR: 2.4

## 2016-05-12 NOTE — Progress Notes (Signed)
Anticoagulation Management Paul Ryan is a 71 y.o. male who reports to the clinic for monitoring of warfarin treatment.    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 Supervising physician: Murriel Hopper  Anticoagulation Clinic Visit History: Patient does not report signs/symptoms of bleeding or thromboembolism   Anticoagulation Episode Summary    Current INR goal:   2.0-3.0  TTR:   67.9 % (1.7 y)  Next INR check:   06/09/2016  INR from last check:   2.4 (05/12/2016)  Weekly max dose:     Target end date:     INR check location:     Preferred lab:     Send INR reminders to:      Indications   Chronic anticoagulation [Z79.01] Coagulopathy ([place]) [[name]] DVT (deep venous thrombosis) ([name]) [[name]] Pulmonary embolism ([name]) [[name]] 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]       Comments:   DR [id number]       ASSESSMENT Recent Results: The most recent result is correlated with 24 mg per week: Lab Results  Component Value Date   INR 2.4 05/12/2016   INR 3.0 05/07/2016   INR 2.7 04/18/2016   PROTIME 15.6 (H) 05/17/2013   Anticoagulation Dosing: INR as of 05/12/2016 and Previous Dosing Information    INR Dt INR Goal [name] [name]   05/12/2016 2.4 2.0-3.0 24 mg 4 mg 4 mg 4 mg 2 mg 4 mg 4 mg 2 mg    Anticoagulation Dose Instructions as of 05/12/2016      Total [name]   New Dose 24 mg 4 mg 4 mg 4 mg 2 mg 4 mg 4 mg 2 mg     (4 mg x 1)  (4 mg x 1)  (4 mg x 1)  (4 mg x 0.5)  (4 mg x 1)  (4 mg x 1)  (4 mg x 0.5)                           INR today: Therapeutic  PLAN Weekly dose was unchanged  Patient Instructions  Patient educated about medication as defined in this encounter and verbalized understanding by repeating back instructions provided.   Patient advised to contact clinic or seek medical attention if signs/symptoms of bleeding or thromboembolism occur.  Patient verbalized  understanding by repeating back information and was advised to contact me if further medication-related questions arise. Patient was also provided an information handout.  Follow-up Return in about 4 weeks (around 06/09/2016).  [name]

## 2016-05-12 NOTE — Patient Instructions (Signed)
Patient educated about medication as defined in this encounter and verbalized understanding by repeating back instructions provided.   

## 2016-05-27 ENCOUNTER — Encounter: Payer: Self-pay | Admitting: Family Medicine

## 2016-06-01 ENCOUNTER — Encounter: Payer: Self-pay | Admitting: Family Medicine

## 2016-06-04 ENCOUNTER — Encounter: Payer: Self-pay | Admitting: Family Medicine

## 2016-06-10 ENCOUNTER — Other Ambulatory Visit (INDEPENDENT_AMBULATORY_CARE_PROVIDER_SITE_OTHER): Payer: Medicare Other

## 2016-06-10 ENCOUNTER — Ambulatory Visit (INDEPENDENT_AMBULATORY_CARE_PROVIDER_SITE_OTHER): Payer: Medicare Other | Admitting: Pharmacist

## 2016-06-10 DIAGNOSIS — D6859 Other primary thrombophilia: Secondary | ICD-10-CM

## 2016-06-10 DIAGNOSIS — I82509 Chronic embolism and thrombosis of unspecified deep veins of unspecified lower extremity: Secondary | ICD-10-CM

## 2016-06-10 DIAGNOSIS — I2699 Other pulmonary embolism without acute cor pulmonale: Secondary | ICD-10-CM

## 2016-06-10 DIAGNOSIS — I2782 Chronic pulmonary embolism: Secondary | ICD-10-CM

## 2016-06-10 DIAGNOSIS — Z7901 Long term (current) use of anticoagulants: Secondary | ICD-10-CM | POA: Diagnosis not present

## 2016-06-10 DIAGNOSIS — D688 Other specified coagulation defects: Secondary | ICD-10-CM | POA: Diagnosis not present

## 2016-06-10 DIAGNOSIS — D689 Coagulation defect, unspecified: Secondary | ICD-10-CM

## 2016-06-10 DIAGNOSIS — D72829 Elevated white blood cell count, unspecified: Secondary | ICD-10-CM

## 2016-06-10 LAB — POCT INR: INR: 2.1

## 2016-06-10 NOTE — Progress Notes (Signed)
Reviewed thx drG 

## 2016-06-10 NOTE — Progress Notes (Signed)
Anticoagulation Management Paul Ryan is a 71 y.o. male who reports to the clinic for monitoring of warfarin treatment.    Indication: DVT, PE and protein C deficiency Duration: indefinite Supervising physician: Murriel Hopper  Anticoagulation Clinic Visit History: Patient does not report signs/symptoms of bleeding or thromboembolism. Patient has an upcoming colonoscopy scheduled on 06/19/16.  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]       Comments:   DR [id number]       ASSESSMENT Recent Results: The most recent result is correlated with 24 mg per week: Lab Results  Component Value Date   INR 2.1 06/10/2016   INR 2.4 05/12/2016   INR 3.0 05/07/2016   PROTIME 15.6 (H) 05/17/2013   Anticoagulation Dosing: INR as of 06/10/2016 and Previous Dosing Information    INR Dt INR Goal [name] [name]   06/10/2016 2.1 2.0-3.0 24 mg 4 mg 4 mg 4 mg 2 mg 4 mg 4 mg 2 mg    Anticoagulation Dose Instructions as of 06/10/2016      Total [name]   New Dose 24 mg 4 mg 4 mg 4 mg 2 mg 4 mg 4 mg 2 mg     (4 mg x 1)  (4 mg x 1)  (4 mg x 1)  (4 mg x 0.5)  (4 mg x 1)  (4 mg x 1)  (4 mg x 0.5)                           INR today: Therapeutic  PLAN Weekly dose was unchanged. Patient was provided bridging instructions to stop warfarin on 06/16/16, start enoxaparin 80 mg BID on 06/19/16, last dose of enoxaparin on the morning prior to coloscopy, re-start enoxaparin and warfarin bridge post colonoscopy same evening. Plan to  follow up with patient immediately after colonoscopy and throughout bridging process. Communication has also been faxed to Dr. Richmond Campbell with [facility], who is assigned to patient for coloscopy.  There are no Patient Instructions on file for this visit. Patient advised to contact clinic or seek medical attention if signs/symptoms of bleeding or thromboembolism occur.  Patient verbalized understanding by repeating back information and was advised to contact me if further medication-related questions arise. Patient was also provided an information handout.  Follow-up Return in about 3 weeks (around 07/01/2016). for office visit. In the meantime, will follow up with patient via home INR monitoring and plan to have patient seen in clinic sooner if needed.  Paul Ryan

## 2016-06-11 LAB — CBC WITH DIFFERENTIAL/PLATELET
BASOS ABS: 0 10*3/uL (ref 0.0–0.2)
BASOS: 0 %
EOS (ABSOLUTE): 0.2 10*3/uL (ref 0.0–0.4)
Eos: 3 %
HEMATOCRIT: 45 % (ref 37.5–51.0)
HEMOGLOBIN: 15.3 g/dL (ref 13.0–17.7)
Immature Grans (Abs): 0 10*3/uL (ref 0.0–0.1)
Immature Granulocytes: 0 %
LYMPHS ABS: 2.2 10*3/uL (ref 0.7–3.1)
Lymphs: 33 %
MCH: 30.5 pg (ref 26.6–33.0)
MCHC: 34 g/dL (ref 31.5–35.7)
MCV: 90 fL (ref 79–97)
MONOCYTES: 7 %
Monocytes Absolute: 0.5 10*3/uL (ref 0.1–0.9)
NEUTROS ABS: 3.9 10*3/uL (ref 1.4–7.0)
Neutrophils: 57 %
Platelets: 216 10*3/uL (ref 150–379)
RBC: 5.01 x10E6/uL (ref 4.14–5.80)
RDW: 14.7 % (ref 12.3–15.4)
WBC: 6.7 10*3/uL (ref 3.4–10.8)

## 2016-06-12 ENCOUNTER — Encounter: Payer: Self-pay | Admitting: Family Medicine

## 2016-06-19 LAB — HM COLONOSCOPY

## 2016-06-26 ENCOUNTER — Telehealth (INDEPENDENT_AMBULATORY_CARE_PROVIDER_SITE_OTHER): Payer: Medicare Other | Admitting: Pharmacist

## 2016-06-26 ENCOUNTER — Encounter: Payer: Self-pay | Admitting: Family Medicine

## 2016-06-26 DIAGNOSIS — D6859 Other primary thrombophilia: Secondary | ICD-10-CM

## 2016-06-26 DIAGNOSIS — Z7901 Long term (current) use of anticoagulants: Secondary | ICD-10-CM | POA: Diagnosis not present

## 2016-06-26 DIAGNOSIS — D689 Coagulation defect, unspecified: Secondary | ICD-10-CM

## 2016-06-26 DIAGNOSIS — D688 Other specified coagulation defects: Secondary | ICD-10-CM

## 2016-06-26 DIAGNOSIS — I82509 Chronic embolism and thrombosis of unspecified deep veins of unspecified lower extremity: Secondary | ICD-10-CM

## 2016-06-26 DIAGNOSIS — I2699 Other pulmonary embolism without acute cor pulmonale: Secondary | ICD-10-CM

## 2016-06-26 LAB — POCT INR: INR: 2

## 2016-06-26 NOTE — Patient Instructions (Signed)
Patient educated about medication as defined in this encounter and verbalized understanding by repeating back instructions provided.   

## 2016-06-26 NOTE — Progress Notes (Signed)
Anticoagulation Management Paul Ryan is a 71 y.o. male who reports to the clinic for monitoring of warfarin treatment.    Indication: DVT, PE and protein S deficiency  Duration: indefinite Supervising physician: Murriel Hopper  Anticoagulation Clinic Visit History: Patient does not report signs/symptoms of bleeding or thromboembolism. Patient underwent colonoscopy 06/19/16, and was bridged with enoxaparin.  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]       Comments:   DR [id number]       ASSESSMENT Recent Results: The most recent result is correlated with 24 mg per week: Lab Results  Component Value Date   INR 2.0 06/26/2016   INR 2.1 06/10/2016   INR 2.4 05/12/2016   PROTIME 15.6 (H) 05/17/2013   Anticoagulation Dosing: INR as of 06/26/2016 and Previous Dosing Information    INR Dt INR Goal [name] [name]   06/26/2016 2.0 2.0-3.0 24 mg 4 mg 4 mg 4 mg 2 mg 4 mg 4 mg 2 mg    Anticoagulation Dose Instructions as of 06/26/2016      Total [name]   New Dose 24 mg 4 mg 4 mg 4 mg 2 mg 4 mg 4 mg 2 mg     (4 mg x 1)  (4 mg x 1)  (4 mg x 1)  (4 mg x 0.5)  (4 mg x 1)  (4 mg x 1)  (4 mg x 0.5)                           INR today: Therapeutic  PLAN Weekly dose was unchanged. D/c enoxaparin.  There are no Patient Instructions on file for this visit. Patient advised to contact clinic or seek medical attention if signs/symptoms of bleeding or thromboembolism occur.  Patient verbalized understanding by  repeating back information and was advised to contact me if further medication-related questions arise. Patient was also provided an information handout.  Follow-up Return in about 1 week (around 07/03/2016).  [name]

## 2016-07-08 ENCOUNTER — Telehealth (INDEPENDENT_AMBULATORY_CARE_PROVIDER_SITE_OTHER): Payer: Medicare Other | Admitting: Pharmacist

## 2016-07-08 DIAGNOSIS — Z7901 Long term (current) use of anticoagulants: Secondary | ICD-10-CM

## 2016-07-08 DIAGNOSIS — D6859 Other primary thrombophilia: Secondary | ICD-10-CM

## 2016-07-08 DIAGNOSIS — I2699 Other pulmonary embolism without acute cor pulmonale: Secondary | ICD-10-CM

## 2016-07-08 DIAGNOSIS — D688 Other specified coagulation defects: Secondary | ICD-10-CM | POA: Diagnosis not present

## 2016-07-08 DIAGNOSIS — I82509 Chronic embolism and thrombosis of unspecified deep veins of unspecified lower extremity: Secondary | ICD-10-CM

## 2016-07-08 DIAGNOSIS — D689 Coagulation defect, unspecified: Secondary | ICD-10-CM

## 2016-07-08 LAB — POCT INR: INR: 2

## 2016-07-08 NOTE — Patient Instructions (Signed)
Patient educated about medication as defined in this encounter and verbalized understanding by repeating back instructions provided.   

## 2016-07-08 NOTE — Progress Notes (Signed)
Anticoagulation Management Paul Ryan is a 71 y.o. male who reports to the clinic for monitoring of warfarin treatment.    Indication: DVT and PE history, Protein S deficiency Duration: indefinite Supervising physician: Murriel Hopper  Anticoagulation Clinic Visit History: Patient does not report signs/symptoms of bleeding or thromboembolism  Anticoagulation Episode Summary    Current INR goal:   2.0-3.0  TTR:   70.6 % (1.9 y)  Next INR check:   07/22/2016  INR from last check:   2.0 (07/08/2016)  Weekly max dose:     Target end date:     INR check location:     Preferred lab:     Send INR reminders to:      Indications   Chronic anticoagulation [Z79.01] Coagulopathy ([place]) [[name]] DVT (deep venous thrombosis) ([name]) [[name]] Pulmonary embolism ([name]) [[name]] 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]       Comments:   DR [id number]       ASSESSMENT Recent Results: The most recent result is correlated with 24 mg per week: Lab Results  Component Value Date   INR 2.0 07/08/2016   INR 2.0 06/26/2016   INR 2.1 06/10/2016   PROTIME 15.6 (H) 05/17/2013   Anticoagulation Dosing: INR as of 07/08/2016 and Previous Dosing Information    INR Dt INR Goal [name] [name]   07/08/2016 2.0 2.0-3.0 24 mg 4 mg 4 mg 4 mg 2 mg 4 mg 4 mg 2 mg    Anticoagulation Dose Instructions as of 07/08/2016      Total [name]   New Dose 24 mg 4 mg 4 mg 4 mg 2 mg 4 mg 4 mg 2 mg     (4 mg x 1)  (4 mg x 1)  (4 mg x 1)  (4 mg x 0.5)  (4 mg x 1)  (4 mg x 1)  (4 mg x 0.5)                           INR today: Therapeutic  PLAN Weekly dose was unchanged  Patient Instructions  Patient educated about medication as defined in this encounter and verbalized understanding by repeating back instructions provided.   Patient advised to contact clinic or seek medical attention if signs/symptoms of bleeding or thromboembolism occur.  Patient verbalized  understanding by repeating back information and was advised to contact me if further medication-related questions arise. Patient was also provided an information handout.  Follow-up Return in about 2 weeks (around 07/22/2016).  [name]

## 2016-07-09 LAB — PROTIME-INR

## 2016-07-14 NOTE — Telephone Encounter (Signed)
Reviewed Thanks DrG 

## 2016-07-18 ENCOUNTER — Other Ambulatory Visit (INDEPENDENT_AMBULATORY_CARE_PROVIDER_SITE_OTHER): Payer: Medicare Other

## 2016-07-18 DIAGNOSIS — E785 Hyperlipidemia, unspecified: Secondary | ICD-10-CM | POA: Diagnosis not present

## 2016-07-18 DIAGNOSIS — E039 Hypothyroidism, unspecified: Secondary | ICD-10-CM | POA: Diagnosis not present

## 2016-07-18 DIAGNOSIS — R300 Dysuria: Secondary | ICD-10-CM | POA: Diagnosis not present

## 2016-07-18 DIAGNOSIS — R739 Hyperglycemia, unspecified: Secondary | ICD-10-CM

## 2016-07-18 DIAGNOSIS — R350 Frequency of micturition: Secondary | ICD-10-CM

## 2016-07-18 LAB — CBC
HCT: 45.3 % (ref 39.0–52.0)
Hemoglobin: 15 g/dL (ref 13.0–17.0)
MCHC: 33.2 g/dL (ref 30.0–36.0)
MCV: 90.7 fl (ref 78.0–100.0)
PLATELETS: 195 10*3/uL (ref 150.0–400.0)
RBC: 4.99 Mil/uL (ref 4.22–5.81)
RDW: 14.2 % (ref 11.5–15.5)
WBC: 6.8 10*3/uL (ref 4.0–10.5)

## 2016-07-18 LAB — COMPREHENSIVE METABOLIC PANEL
ALBUMIN: 4.1 g/dL (ref 3.5–5.2)
ALK PHOS: 54 U/L (ref 39–117)
ALT: 21 U/L (ref 0–53)
AST: 19 U/L (ref 0–37)
BILIRUBIN TOTAL: 0.4 mg/dL (ref 0.2–1.2)
BUN: 22 mg/dL (ref 6–23)
CO2: 29 mEq/L (ref 19–32)
Calcium: 9.3 mg/dL (ref 8.4–10.5)
Chloride: 107 mEq/L (ref 96–112)
Creatinine, Ser: 1.1 mg/dL (ref 0.40–1.50)
GFR: 69.96 mL/min (ref >60.00)
GLUCOSE: 109 mg/dL — AB (ref 70–99)
Potassium: 4.6 mEq/L (ref 3.5–5.1)
Sodium: 142 mEq/L (ref 135–145)
TOTAL PROTEIN: 7.1 g/dL (ref 6.0–8.3)

## 2016-07-18 LAB — T4, FREE: Free T4: 0.56 ng/dL — ABNORMAL LOW (ref 0.60–1.60)

## 2016-07-18 LAB — LIPID PANEL
Cholesterol: 237 mg/dL — ABNORMAL HIGH (ref 0–200)
HDL: 40.5 mg/dL (ref >39.00)
LDL CALC: 158 mg/dL — AB (ref 0–99)
NONHDL: 196.05
Total CHOL/HDL Ratio: 6
Triglycerides: 191 mg/dL — ABNORMAL HIGH (ref 0.0–149.0)
VLDL: 38.2 mg/dL (ref 0.0–40.0)

## 2016-07-18 LAB — PSA: PSA: 1.53 ng/mL (ref 0.10–4.00)

## 2016-07-18 LAB — TSH: TSH: 1.38 u[IU]/mL (ref 0.35–4.50)

## 2016-07-18 LAB — HEMOGLOBIN A1C: Hgb A1c MFr Bld: 6.3 % (ref 4.6–6.5)

## 2016-07-21 NOTE — Progress Notes (Signed)
Subjective:   Paul Ryan is a 71 y.o. male who presents for Medicare Annual/Subsequent preventive examination.  Review of Systems:  No ROS.  Medicare Wellness Visit.    Sleep patterns:   Home Safety/Smoke Alarms:   Living environment; residence and Firearm Safety:  Seat Belt Safety/Bike Helmet: Wears seat belt.   Counseling:   Eye Exam-  Dental-  Male:   CCS- Last 06/19/16: diverticulosis. Repeat in 5 yrs. PSA-  Lab Results  Component Value Date   PSA 1.53 07/18/2016   PSA 1.63 10/14/2011       Objective:    Vitals: There were no vitals taken for this visit.  There is no height or weight on file to calculate BMI.  Tobacco History  Smoking Status  . Former Smoker  . Packs/day: 1.00  . Years: 20.00  . Types: Cigarettes  . Quit date: 03/03/1980  Smokeless Tobacco  . Never Used     Counseling given: Not Answered   Past Medical History:  Diagnosis Date  . Acid reflux 02/03/2016  . Allergy    seasonal  . Chronic anticoagulation 06/14/2013  . Coagulopathy ([place]) 05/16/2011  . Collagen vascular disease ([place])   . DVT (deep venous thrombosis) ([name])    takes coumadin  . Dysuria 02/27/2013  . 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 Hyperglycemia 12/26/2014  . Hyperlipidemia   . Hypothyroid 06/10/2011  . Insomnia 04/24/2016  . Left hip pain 12/17/2010  . Polymyalgia ([place]) 03/21/2014  . 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 Preventative health care 12/17/2010  . Shingles 1982   right abdominal wall  . Thyroid nodule 11/29/2014  . Vitamin D deficiency 10/14/2011   Past Surgical History:  Procedure Laterality Date  . 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 Cancer Mother        unknown- everywhere  . Cancer Sister        breast   History  Sexual Activity  . Sexual activity: Not on file    Outpatient Encounter Prescriptions as of 07/22/2016  Medication Sig  . albuterol  (PROVENTIL HFA;VENTOLIN HFA) 108 (90 Base) MCG/ACT inhaler Inhale 2 puffs into the lungs every 4 (four) hours as needed for wheezing or shortness of breath.  [name] Kitchen alendronate (FOSAMAX) 70 MG tablet Take 1 tablet (70 mg total) by mouth every 7 (seven) days. Take with a full glass of water on an empty stomach.  [name] Kitchen b complex vitamins tablet Take 1 tablet by mouth daily.    . cefdinir (OMNICEF) 300 MG capsule Take 1 capsule (300 mg total) by mouth 2 (two) times daily. Take for 7 days  . Cholecalciferol 4000 UNITS TABS Take 1 tablet by mouth daily.  . Coenzyme Q10 (COQ10 PO) Take 2 tablets by mouth daily.  [name] Kitchen doxycycline (VIBRA-TABS) 100 MG tablet Take 1 tablet (100 mg total) by mouth 2 (two) times daily.  . famotidine (PEPCID) 40 MG tablet Take 40 mg by mouth daily.  Javier Docker Oil 300 MG CAPS 1 cap daily  . NIACIN PO Take 1 tablet by mouth 3 (three) times daily.   . Red Yeast Rice Extract 600 MG CAPS Take 3 capsules by mouth daily.  . rosuvastatin (CRESTOR) 20 MG tablet TAKE 1 TABLET (20 MG TOTAL) BY MOUTH AT BEDTIME.  [name] Kitchen tacrolimus (PROTOPIC) 0.1 % ointment Apply topically 2 (two) times daily.  [name] Kitchen thyroid (ARMOUR THYROID) 60 MG  tablet Take 1 tablet by mouth as directed alternating with the 90 mg  . thyroid (ARMOUR THYROID) 90 MG tablet Take 1 tablet by mouth as directed alternating with the 60 mg  . warfarin (COUMADIN) 4 MG tablet Take 1 tablet (4 mg total) by mouth daily. Take 1/2 tablet Wednesdays and Saturdays   No facility-administered encounter medications on file as of 07/22/2016.     Activities of Daily Living In your present state of health, do you have any difficulty performing the following activities: 01/07/2016  Hearing? N  Vision? N  Difficulty concentrating or making decisions? N  Walking or climbing stairs? N  Dressing or bathing? N  Doing errands, shopping? N  Some recent data might be hidden    Patient Care Team: [name], MD as PCP - General (Family Medicine) Roena Malady, MD (Ophthalmology) Forde Dandy, PharmD as Pharmacist (Pharmacist)   Assessment:    Physical assessment deferred to PCP.  Exercise Activities and Dietary recommendations   Diet (meal preparation, eat out, water intake, caffeinated beverages, dairy products, fruits and vegetables): well balanced Breakfast: Lunch:  Dinner:      Goals    None     Fall Risk Fall Risk  01/07/2016 01/09/2015 09/05/2014 04/18/2014 03/21/2014  Falls in the past year? No No No No No   Depression Screen PHQ 2/9 Scores 01/07/2016 01/09/2015 09/05/2014 04/18/2014  PHQ - 2 Score 0 0 0 0    Cognitive Function        Immunization History  Administered Date(s) Administered  . Hepatitis A 11/09/1998  . IPV 11/09/1998  . Influenza Split 11/22/2015  . Influenza Whole 12/13/2012  . Influenza,inj,Quad PF,36+ Mos 10/25/2013, 12/26/2014  . Influenza-Unspecified 12/02/2011  . PPD Test 03/23/2015  . Pneumococcal Conjugate-13 10/25/2013  . Pneumococcal Polysaccharide-23 06/19/2009, 12/26/2014  . Td 03/03/1990, 07/01/2001  . Tdap 12/17/2010  . Typhoid Inactivated 11/09/1998, 03/09/2001  . Zoster 08/01/2008   Screening Tests Health Maintenance  Topic Date Due  . FOOT EXAM  08/24/1954  . OPHTHALMOLOGY EXAM  08/24/1954  . URINE MICROALBUMIN  08/24/1954  . INFLUENZA VACCINE  10/01/2016  . HEMOGLOBIN A1C  01/18/2017  . TETANUS/TDAP  12/16/2020  . COLONOSCOPY  06/20/2026  . Hepatitis C Screening  Completed  . PNA vac Low Risk Adult  Completed      Plan:     I have personally reviewed and noted the following in the patient's chart:   . Medical and social history . Use of alcohol, tobacco or illicit drugs  . Current medications and supplements . Functional ability and status . Nutritional status . Physical activity . Advanced directives . List of other physicians . Hospitalizations, surgeries, and ER visits in previous 12 months . Vitals . Screenings to include cognitive, depression,  and falls . Referrals and appointments  In addition, I have reviewed and discussed with patient certain preventive protocols, quality metrics, and best practice recommendations. A written personalized care plan for preventive services as well as general preventive health recommendations were provided to patient.     Shela Nevin, [place]  07/21/2016   Medical screening examination was performed by Health Coach and as supervising physician I was immediately available for consultation/collaboration. I have reviewed documentation and agree with assessment and plan.  Penni Homans, MD

## 2016-07-22 ENCOUNTER — Ambulatory Visit (INDEPENDENT_AMBULATORY_CARE_PROVIDER_SITE_OTHER): Payer: Medicare Other | Admitting: Family Medicine

## 2016-07-22 ENCOUNTER — Encounter: Payer: Self-pay | Admitting: Pharmacist

## 2016-07-22 ENCOUNTER — Encounter: Payer: Self-pay | Admitting: Family Medicine

## 2016-07-22 VITALS — BP 118/76 | HR 65 | Temp 98.6°F | Wt 164.8 lb

## 2016-07-22 DIAGNOSIS — E039 Hypothyroidism, unspecified: Secondary | ICD-10-CM

## 2016-07-22 DIAGNOSIS — J3489 Other specified disorders of nose and nasal sinuses: Secondary | ICD-10-CM

## 2016-07-22 DIAGNOSIS — Z7901 Long term (current) use of anticoagulants: Secondary | ICD-10-CM

## 2016-07-22 DIAGNOSIS — G3184 Mild cognitive impairment, so stated: Secondary | ICD-10-CM

## 2016-07-22 DIAGNOSIS — D6859 Other primary thrombophilia: Secondary | ICD-10-CM

## 2016-07-22 DIAGNOSIS — E559 Vitamin D deficiency, unspecified: Secondary | ICD-10-CM

## 2016-07-22 DIAGNOSIS — D689 Coagulation defect, unspecified: Secondary | ICD-10-CM

## 2016-07-22 DIAGNOSIS — M353 Polymyalgia rheumatica: Secondary | ICD-10-CM

## 2016-07-22 DIAGNOSIS — D688 Other specified coagulation defects: Secondary | ICD-10-CM

## 2016-07-22 DIAGNOSIS — I2699 Other pulmonary embolism without acute cor pulmonale: Secondary | ICD-10-CM

## 2016-07-22 DIAGNOSIS — L089 Local infection of the skin and subcutaneous tissue, unspecified: Secondary | ICD-10-CM

## 2016-07-22 DIAGNOSIS — B958 Unspecified staphylococcus as the cause of diseases classified elsewhere: Secondary | ICD-10-CM

## 2016-07-22 DIAGNOSIS — I82509 Chronic embolism and thrombosis of unspecified deep veins of unspecified lower extremity: Secondary | ICD-10-CM

## 2016-07-22 DIAGNOSIS — R739 Hyperglycemia, unspecified: Secondary | ICD-10-CM | POA: Diagnosis not present

## 2016-07-22 DIAGNOSIS — E785 Hyperlipidemia, unspecified: Secondary | ICD-10-CM | POA: Diagnosis not present

## 2016-07-22 HISTORY — DX: Local infection of the skin and subcutaneous tissue, unspecified: L08.9

## 2016-07-22 HISTORY — DX: Other specified disorders of nose and nasal sinuses: J34.89

## 2016-07-22 HISTORY — DX: Mild cognitive impairment of uncertain or unknown etiology: G31.84

## 2016-07-22 HISTORY — DX: Unspecified staphylococcus as the cause of diseases classified elsewhere: B95.8

## 2016-07-22 LAB — POCT INR: INR: 1.7

## 2016-07-22 MED ORDER — MUPIROCIN 2 % EX OINT: 1.0000 "application " | TOPICAL_OINTMENT | Freq: Two times a day (BID) | CUTANEOUS | 0 refills | Status: DC

## 2016-07-22 MED ORDER — CEFDINIR 300 MG PO CAPS: 300.0000 mg | ORAL_CAPSULE | Freq: Two times a day (BID) | ORAL | 0 refills | Status: DC

## 2016-07-22 MED ORDER — ROSUVASTATIN CALCIUM 40 MG PO TABS: 40.0000 mg | ORAL_TABLET | Freq: Every day | ORAL | 1 refills | Status: DC

## 2016-07-22 MED ORDER — NYSTATIN-TRIAMCINOLONE 100000-0.1 UNIT/GM-% EX OINT: 1.0000 "application " | TOPICAL_OINTMENT | Freq: Two times a day (BID) | CUTANEOUS | 0 refills | Status: AC

## 2016-07-22 NOTE — Patient Instructions (Signed)
Patient educated about medication as defined in this encounter and verbalized understanding by repeating back instructions provided.   

## 2016-07-22 NOTE — Progress Notes (Signed)
Reviewed Thanks DrG 

## 2016-07-22 NOTE — Assessment & Plan Note (Signed)
Asymptomatic , tolerating Coumadin

## 2016-07-22 NOTE — Assessment & Plan Note (Signed)
He notes worsening memory over past 2 years. Is taking Vit D, Curcumen, fish or krill oile and probiotics, increase exercise and continue a heart healthy diet. Consider a referral to neurology for further work up

## 2016-07-22 NOTE — Progress Notes (Signed)
Pre visit review using our clinic review tool, if applicable. No additional management support is needed unless otherwise documented below in the visit note. 

## 2016-07-22 NOTE — Assessment & Plan Note (Signed)
No recurrent blood clots this year.

## 2016-07-22 NOTE — Progress Notes (Signed)
Patient ID: Paul Ryan, male   DOB: October 21, 1945, 71 y.o.   MRN: [id number]   Subjective:  I acted as a Education administrator for Penni Homans, [place], [place]   Patient ID: Paul Ryan, male    DOB: 1945-04-30, 71 y.o.   MRN: [id number]  Chief Complaint  Patient presents with  . Follow-up    anitcoagulation, 24-month F/U.    HPI  Patient is in today for a 72-month follow up for anticoagulation. Patient wants to discuss the spots on his scalp that are still there. Patient has a Hx of DVT, hypothyroid, coagulopathy, hyperlipidemia, Vitamin D deficiency. Patient is noting slow but progressive trouble with his memory is noting some trouble with word finding at times and forgetting where he left things. He is also noting some tender lesions on his scalp with small pustules on top. They come and go. He also notes some tender lesions in his nares. Denies CP/palp/SOB/HA/congestion/fevers/GI or GU c/o. Taking meds as prescribed  Patient Care Team: [name], MD as PCP - General (Family Medicine) Roena Malady, MD (Ophthalmology) Forde Dandy, PharmD as Pharmacist (Pharmacist)   Past Medical History:  Diagnosis Date  . Acid reflux 02/03/2016  . Allergy    seasonal  . Chronic anticoagulation 06/14/2013  . Coagulopathy ([place]) 05/16/2011  . Collagen vascular disease ([place])   . DVT (deep venous thrombosis) ([name])    takes coumadin  . Dysuria 02/27/2013  . 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 Hyperglycemia 12/26/2014  . Hyperlipidemia   . Hypothyroid 06/10/2011  . Insomnia 04/24/2016  . Left hip pain 12/17/2010  . MCI (mild cognitive impairment) 07/22/2016  . Nasal lesion 07/22/2016  . Polymyalgia ([place]) 03/21/2014  . 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 Preventative health care 12/17/2010  . Shingles 1982   right abdominal wall  . Staph skin infection 07/22/2016  . Thyroid nodule 11/29/2014  . Vitamin D deficiency 10/14/2011    Past Surgical History:  Procedure Laterality  Date  . 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 Cancer Mother        unknown- everywhere  . Cancer Sister        breast    Social History   Social History  . Marital status: Married    Spouse name: N/A  . Number of children: N/A  . Years of education: N/A   Occupational History  . Not on file.   Social History Main Topics  . Smoking status: Former Smoker    Packs/day: 1.00    Years: 20.00    Types: Cigarettes    Quit date: 03/03/1980  . Smokeless tobacco: Never Used  . Alcohol use No  . Drug use: No  . Sexual activity: Not on file   Other Topics Concern  . Not on file   Social History Narrative  . No narrative on file    Outpatient Medications Prior to Visit  Medication Sig Dispense Refill  . albuterol (PROVENTIL HFA;VENTOLIN HFA) 108 (90 Base) MCG/ACT inhaler Inhale 2 puffs into the lungs every 4 (four) hours as needed for wheezing or shortness of breath. 1 Inhaler 3  . b complex vitamins tablet Take 1 tablet by mouth daily.      . Cholecalciferol 4000 UNITS TABS Take 1 tablet by mouth daily.    . Coenzyme Q10 (COQ10 PO) Take 2 tablets by mouth daily.    [name] Kitchen  famotidine (PEPCID) 40 MG tablet Take 40 mg by mouth daily.    Javier Docker Oil 300 MG CAPS 1 cap daily    . NIACIN PO Take 1 tablet by mouth 3 (three) times daily.     . tacrolimus (PROTOPIC) 0.1 % ointment Apply topically 2 (two) times daily. 100 g 1  . thyroid (ARMOUR THYROID) 60 MG tablet Take 1 tablet by mouth as directed alternating with the 90 mg 45 tablet 2  . thyroid (ARMOUR THYROID) 90 MG tablet Take 1 tablet by mouth as directed alternating with the 60 mg 45 tablet 2  . warfarin (COUMADIN) 4 MG tablet Take 1 tablet (4 mg total) by mouth daily. Take 1/2 tablet Wednesdays and Saturdays 54 tablet 3  . Red Yeast Rice Extract 600 MG CAPS Take 3 capsules by mouth daily.    . rosuvastatin (CRESTOR) 20 MG tablet TAKE 1 TABLET (20 MG TOTAL) BY  MOUTH AT BEDTIME. 90 tablet 1  . alendronate (FOSAMAX) 70 MG tablet Take 1 tablet (70 mg total) by mouth every 7 (seven) days. Take with a full glass of water on an empty stomach. (Patient not taking: Reported on 07/22/2016) 4 tablet 11  . cefdinir (OMNICEF) 300 MG capsule Take 1 capsule (300 mg total) by mouth 2 (two) times daily. Take for 7 days (Patient not taking: Reported on 07/22/2016) 14 capsule 0  . doxycycline (VIBRA-TABS) 100 MG tablet Take 1 tablet (100 mg total) by mouth 2 (two) times daily. (Patient not taking: Reported on 07/22/2016) 20 tablet 0   No facility-administered medications prior to visit.     No Known Allergies  Review of Systems  Constitutional: Negative for fever and malaise/fatigue.  HENT: Negative for congestion, nosebleeds and sinus pain.   Eyes: Negative for blurred vision.  Respiratory: Negative for cough and shortness of breath.   Cardiovascular: Negative for chest pain, palpitations and leg swelling.  Gastrointestinal: Negative for vomiting.  Musculoskeletal: Negative for back pain.  Skin: Negative for rash.  Neurological: Negative for loss of consciousness and headaches.  Psychiatric/Behavioral: Positive for memory loss.       Objective:    Physical Exam  Constitutional: He is oriented to person, place, and time. He appears well-developed and well-nourished. No distress.  HENT:  Head: Normocephalic and atraumatic.  Eyes: Conjunctivae are normal.  Neck: Normal range of motion. No thyromegaly present.  Cardiovascular: Normal rate and regular rhythm.   Pulmonary/Chest: Effort normal and breath sounds normal. He has no wheezes.  Abdominal: Soft. Bowel sounds are normal. There is no tenderness.  Musculoskeletal: He exhibits no edema or deformity.  Neurological: He is alert and oriented to person, place, and time.  Skin: Skin is warm and dry. He is not diaphoretic.  Scattered papular lesions on scalp with pustular tops.   Psychiatric: He has a normal  mood and affect.    BP 118/76 (BP Location: Left Arm, Patient Position: Sitting, Cuff Size: Normal)   Pulse 65   Temp 98.6 F (37 C) (Oral)   Wt 164 lb 12.8 oz (74.8 kg)   SpO2 99% Comment: RA  BMI 22.98 kg/m  Wt Readings from Last 3 Encounters:  07/22/16 164 lb 12.8 oz (74.8 kg)  04/24/16 166 lb (75.3 kg)  01/29/16 160 lb (72.6 kg)   BP Readings from Last 3 Encounters:  07/22/16 118/76  04/24/16 108/60  01/29/16 108/66     Immunization History  Administered Date(s) Administered  . Hepatitis A 11/09/1998  . IPV 11/09/1998  .  Influenza Split 11/22/2015  . Influenza Whole 12/13/2012  . Influenza,inj,Quad PF,36+ Mos 10/25/2013, 12/26/2014  . Influenza-Unspecified 12/02/2011  . PPD Test 03/23/2015  . Pneumococcal Conjugate-13 10/25/2013  . Pneumococcal Polysaccharide-23 06/19/2009, 12/26/2014  . Td 03/03/1990, 07/01/2001  . Tdap 12/17/2010  . Typhoid Inactivated 11/09/1998, 03/09/2001  . Zoster 08/01/2008    Health Maintenance  Topic Date Due  . FOOT EXAM  08/24/1954  . OPHTHALMOLOGY EXAM  08/24/1954  . URINE MICROALBUMIN  08/24/1954  . INFLUENZA VACCINE  10/01/2016  . HEMOGLOBIN A1C  01/18/2017  . TETANUS/TDAP  12/16/2020  . COLONOSCOPY  06/20/2026  . Hepatitis C Screening  Completed  . PNA vac Low Risk Adult  Completed    Lab Results  Component Value Date   WBC 6.8 07/18/2016   HGB 15.0 07/18/2016   HCT 45.3 07/18/2016   PLT 195.0 07/18/2016   GLUCOSE 109 (H) 07/18/2016   CHOL 237 (H) 07/18/2016   TRIG 191.0 (H) 07/18/2016   HDL 40.50 07/18/2016   LDLDIRECT 158.0 03/11/2013   LDLCALC 158 (H) 07/18/2016   ALT 21 07/18/2016   AST 19 07/18/2016   NA 142 07/18/2016   K 4.6 07/18/2016   CL 107 07/18/2016   CREATININE 1.10 07/18/2016   BUN 22 07/18/2016   CO2 29 07/18/2016   TSH 1.38 07/18/2016   PSA 1.53 07/18/2016   INR 1.7 07/22/2016   HGBA1C 6.3 07/18/2016    Lab Results  Component Value Date   TSH 1.38 07/18/2016   Lab Results  Component  Value Date   WBC 6.8 07/18/2016   HGB 15.0 07/18/2016   HCT 45.3 07/18/2016   MCV 90.7 07/18/2016   PLT 195.0 07/18/2016   Lab Results  Component Value Date   NA 142 07/18/2016   K 4.6 07/18/2016   CHLORIDE 109 11/16/2012   CO2 29 07/18/2016   GLUCOSE 109 (H) 07/18/2016   BUN 22 07/18/2016   CREATININE 1.10 07/18/2016   BILITOT 0.4 07/18/2016   ALKPHOS 54 07/18/2016   AST 19 07/18/2016   ALT 21 07/18/2016   PROT 7.1 07/18/2016   ALBUMIN 4.1 07/18/2016   CALCIUM 9.3 07/18/2016   ANIONGAP 8 09/11/2014   GFR 69.96 07/18/2016   Lab Results  Component Value Date   CHOL 237 (H) 07/18/2016   Lab Results  Component Value Date   HDL 40.50 07/18/2016   Lab Results  Component Value Date   LDLCALC 158 (H) 07/18/2016   Lab Results  Component Value Date   TRIG 191.0 (H) 07/18/2016   Lab Results  Component Value Date   CHOLHDL 6 07/18/2016   Lab Results  Component Value Date   HGBA1C 6.3 07/18/2016         Assessment & Plan:   Problem List Items Addressed This Visit    Hyperlipidemia - Primary   Relevant Medications   rosuvastatin (CRESTOR) 40 MG tablet   Other Relevant Orders   CBC   Comprehensive metabolic panel   Lipid panel   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      Relevant Orders   Sedimentation rate   Hypothyroid    On Levothyroxine, continue to monitor      Relevant Orders   TSH   Vitamin D deficiency    Encouraged to continue daily vitamin d 4000 IU daily      Polymyalgia rheumatica ([name])    Asymptomatic except occasional right elbow pain at times.  Relevant Orders   Sedimentation rate   Pulmonary embolism ([name])    Asymptomatic , tolerating Coumadin      Relevant Medications   rosuvastatin (CRESTOR) 40 MG tablet   Hyperglycemia    hgba1c acceptable, minimize simple carbs. Increase exercise as tolerated.       Relevant Orders   Microalbumin, urine   Nasal lesion    Recurrent in  nose and on scalp started on Mupirocin      Relevant Medications   mupirocin ointment (BACTROBAN) 2 %   MCI (mild cognitive impairment)    He notes worsening memory over past 2 years. Is taking Vit D, Curcumen, fish or krill oile and probiotics, increase exercise and continue a heart healthy diet. Consider a referral to neurology for further work up      Staph skin infection    Scalp lesions cleanse with H2O2 and then apply Mupirocin ointment daily and started on Cefdinir      Relevant Medications   mupirocin ointment (BACTROBAN) 2 %   nystatin-triamcinolone ointment (MYCOLOG)      I have discontinued Mr. Dorey rosuvastatin, alendronate, Red Yeast Rice Extract, doxycycline, and cefdinir. I am also having him start on mupirocin ointment, rosuvastatin, and nystatin-triamcinolone ointment. Additionally, I am having him maintain his b complex vitamins, Krill Oil, tacrolimus, NIACIN PO, Cholecalciferol, Coenzyme Q10 (COQ10 PO), albuterol, famotidine, warfarin, thyroid, and thyroid.  Meds ordered this encounter  Medications  . mupirocin ointment (BACTROBAN) 2 %    Sig: Place 1 application into the nose 2 (two) times daily.    Dispense:  22 g    Refill:  0  . DISCONTD: cefdinir (OMNICEF) 300 MG capsule    Sig: Take 1 capsule (300 mg total) by mouth 2 (two) times daily. Take for 7 days    Dispense:  20 capsule    Refill:  0  . rosuvastatin (CRESTOR) 40 MG tablet    Sig: Take 1 tablet (40 mg total) by mouth daily.    Dispense:  90 tablet    Refill:  1  . nystatin-triamcinolone ointment (MYCOLOG)    Sig: Apply 1 application topically 2 (two) times daily.    Dispense:  30 g    Refill:  0    CMA served as scribe during this visit. History, Physical and Plan performed by medical provider. Documentation and orders reviewed and attested to.   Penni Homans, MD

## 2016-07-22 NOTE — Progress Notes (Signed)
Anticoagulation Management Paul Ryan is a 71 y.o. male who reports to the clinic for monitoring of warfarin treatment.    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 Supervising physician: Murriel Hopper  Anticoagulation Clinic Visit History: Patient does not report signs/symptoms of bleeding or thromboembolism   Anticoagulation Episode Summary    Current INR goal:   2.0-3.0  TTR:   69.1 % (1.9 y)  Next INR check:   07/29/2016  INR from last check:   1.7! 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[place]) [[name]]       Comments:   DR [id number]       ASSESSMENT Recent Results: The most recent result is correlated with 24 mg per week: Lab Results  Component Value Date   INR 1.7 07/22/2016   INR 2.0 07/08/2016   INR 2.0 06/26/2016   PROTIME 15.6 (H) 05/17/2013    Anticoagulation Dosing: INR as of 07/22/2016 and Previous Warfarin Dosing Information    INR Dt INR Goal [name] [name]   07/22/2016 1.7 2.0-3.0 24 mg 4 mg 4 mg 4 mg 2 mg 4 mg 4 mg 2 mg    Anticoagulation Warfarin Dose Instructions as of 07/22/2016      Total [name]   New Dose 26 mg 4 mg 4 mg 4 mg 4 mg 4 mg 4 mg 2 mg     (4 mg x 1)  (4 mg x 1)  (4 mg x 1)  (4 mg x 1)  (4 mg x 1)  (4 mg x 1)  (4 mg x 0.5)                         Description   Dr. Beryle Beams patient Take enoxaparin 120 mg daily     INR today: Subtherapeutic  PLAN Weekly dose was increased by 10% to 26 mg per week, take enoxaparin 120 mg daily,   Patient Instructions  Patient educated about medication as defined in this encounter and verbalized understanding by repeating back instructions  provided.   Patient advised to contact clinic or seek medical attention if signs/symptoms of bleeding or thromboembolism occur.  Patient verbalized understanding by repeating back information and was advised to contact me if further medication-related questions arise. Patient was also provided an information handout.  Follow-up Return in about 3 days (around 07/25/2016).  [name]

## 2016-07-22 NOTE — Assessment & Plan Note (Addendum)
Recurrent in nose and on scalp started on Mupirocin

## 2016-07-22 NOTE — Assessment & Plan Note (Signed)
Scalp lesions cleanse with H2O2 and then apply Mupirocin ointment daily and started on Cefdinir

## 2016-07-22 NOTE — Patient Instructions (Signed)
MIND diet  DASH Eating Plan DASH stands for "Dietary Approaches to Stop Hypertension." The DASH eating plan is a healthy eating plan that has been shown to reduce high blood pressure (hypertension). It may also reduce your risk for type 2 diabetes, heart disease, and stroke. The DASH eating plan may also help with weight loss. What are tips for following this plan? General guidelines  Avoid eating more than 2,300 mg (milligrams) of salt (sodium) a day. If you have hypertension, you may need to reduce your sodium intake to 1,500 mg a day.  Limit alcohol intake to no more than 1 drink a day for nonpregnant women and 2 drinks a day for men. One drink equals 12 oz of beer, 5 oz of wine, or 1 oz of hard liquor.  Work with your health care provider to maintain a healthy body weight or to lose weight. Ask what an ideal weight is for you.  Get at least 30 minutes of exercise that causes your heart to beat faster (aerobic exercise) most days of the week. Activities may include walking, swimming, or biking.  Work with your health care provider or diet and nutrition specialist (dietitian) to adjust your eating plan to your individual calorie needs. Reading food labels  Check food labels for the amount of sodium per serving. Choose foods with less than 5 percent of the Daily Value of sodium. Generally, foods with less than 300 mg of sodium per serving fit into this eating plan.  To find whole grains, look for the word "whole" as the first word in the ingredient list. Shopping  Buy products labeled as "low-sodium" or "no salt added."  Buy fresh foods. Avoid canned foods and premade or frozen meals. Cooking  Avoid adding salt when cooking. Use salt-free seasonings or herbs instead of table salt or sea salt. Check with your health care provider or pharmacist before using salt substitutes.  Do not fry foods. Cook foods using healthy methods such as baking, boiling, grilling, and broiling  instead.  Cook with heart-healthy oils, such as olive, canola, soybean, or sunflower oil. Meal planning   Eat a balanced diet that includes: ? 5 or more servings of fruits and vegetables each day. At each meal, try to fill half of your plate with fruits and vegetables. ? Up to 6-8 servings of whole grains each day. ? Less than 6 oz of lean meat, poultry, or fish each day. A 3-oz serving of meat is about the same size as a deck of cards. One egg equals 1 oz. ? 2 servings of low-fat dairy each day. ? A serving of nuts, seeds, or beans 5 times each week. ? Heart-healthy fats. Healthy fats called Omega-3 fatty acids are found in foods such as flaxseeds and coldwater fish, like sardines, salmon, and mackerel.  Limit how much you eat of the following: ? Canned or prepackaged foods. ? Food that is high in trans fat, such as fried foods. ? Food that is high in saturated fat, such as fatty meat. ? Sweets, desserts, sugary drinks, and other foods with added sugar. ? Full-fat dairy products.  Do not salt foods before eating.  Try to eat at least 2 vegetarian meals each week.  Eat more home-cooked food and less restaurant, buffet, and fast food.  When eating at a restaurant, ask that your food be prepared with less salt or no salt, if possible. What foods are recommended? The items listed may not be a complete list. Talk with your   dietitian about what dietary choices are best for you. Grains Whole-grain or whole-wheat bread. Whole-grain or whole-wheat pasta. Brown rice. Oatmeal. Quinoa. Bulgur. Whole-grain and low-sodium cereals. Pita bread. Low-fat, low-sodium crackers. Whole-wheat flour tortillas. Vegetables Fresh or frozen vegetables (raw, steamed, roasted, or grilled). Low-sodium or reduced-sodium tomato and vegetable juice. Low-sodium or reduced-sodium tomato sauce and tomato paste. Low-sodium or reduced-sodium canned vegetables. Fruits All fresh, dried, or frozen fruit. Canned fruit in  natural juice (without added sugar). Meat and other protein foods Skinless chicken or turkey. Ground chicken or turkey. Pork with fat trimmed off. Fish and seafood. Egg whites. Dried beans, peas, or lentils. Unsalted nuts, nut butters, and seeds. Unsalted canned beans. Lean cuts of beef with fat trimmed off. Low-sodium, lean deli meat. Dairy Low-fat (1%) or fat-free (skim) milk. Fat-free, low-fat, or reduced-fat cheeses. Nonfat, low-sodium ricotta or cottage cheese. Low-fat or nonfat yogurt. Low-fat, low-sodium cheese. Fats and oils Soft margarine without trans fats. Vegetable oil. Low-fat, reduced-fat, or light mayonnaise and salad dressings (reduced-sodium). Canola, safflower, olive, soybean, and sunflower oils. Avocado. Seasoning and other foods Herbs. Spices. Seasoning mixes without salt. Unsalted popcorn and pretzels. Fat-free sweets. What foods are not recommended? The items listed may not be a complete list. Talk with your dietitian about what dietary choices are best for you. Grains Baked goods made with fat, such as croissants, muffins, or some breads. Dry pasta or rice meal packs. Vegetables Creamed or fried vegetables. Vegetables in a cheese sauce. Regular canned vegetables (not low-sodium or reduced-sodium). Regular canned tomato sauce and paste (not low-sodium or reduced-sodium). Regular tomato and vegetable juice (not low-sodium or reduced-sodium). Pickles. Olives. Fruits Canned fruit in a light or heavy syrup. Fried fruit. Fruit in cream or butter sauce. Meat and other protein foods Fatty cuts of meat. Ribs. Fried meat. Bacon. Sausage. Bologna and other processed lunch meats. Salami. Fatback. Hotdogs. Bratwurst. Salted nuts and seeds. Canned beans with added salt. Canned or smoked fish. Whole eggs or egg yolks. Chicken or turkey with skin. Dairy Whole or 2% milk, cream, and half-and-half. Whole or full-fat cream cheese. Whole-fat or sweetened yogurt. Full-fat cheese. Nondairy  creamers. Whipped toppings. Processed cheese and cheese spreads. Fats and oils Butter. Stick margarine. Lard. Shortening. Ghee. Bacon fat. Tropical oils, such as coconut, palm kernel, or palm oil. Seasoning and other foods Salted popcorn and pretzels. Onion salt, garlic salt, seasoned salt, table salt, and sea salt. Worcestershire sauce. Tartar sauce. Barbecue sauce. Teriyaki sauce. Soy sauce, including reduced-sodium. Steak sauce. Canned and packaged gravies. Fish sauce. Oyster sauce. Cocktail sauce. Horseradish that you find on the shelf. Ketchup. Mustard. Meat flavorings and tenderizers. Bouillon cubes. Hot sauce and Tabasco sauce. Premade or packaged marinades. Premade or packaged taco seasonings. Relishes. Regular salad dressings. Where to find more information:  National Heart, Lung, and Blood Institute: www.nhlbi.nih.gov  American Heart Association: www.heart.org Summary  The DASH eating plan is a healthy eating plan that has been shown to reduce high blood pressure (hypertension). It may also reduce your risk for type 2 diabetes, heart disease, and stroke.  With the DASH eating plan, you should limit salt (sodium) intake to 2,300 mg a day. If you have hypertension, you may need to reduce your sodium intake to 1,500 mg a day.  When on the DASH eating plan, aim to eat more fresh fruits and vegetables, whole grains, lean proteins, low-fat dairy, and heart-healthy fats.  Work with your health care provider or diet and nutrition specialist (dietitian) to adjust your eating plan   to your individual calorie needs. This information is not intended to replace advice given to you by your health care provider. Make sure you discuss any questions you have with your health care provider. Document Released: 02/06/2011 Document Revised: 02/11/2016 Document Reviewed: 02/11/2016 Elsevier Interactive Patient Education  2017 Elsevier Inc.  

## 2016-07-22 NOTE — Assessment & Plan Note (Addendum)
Encouraged to continue daily vitamin d 4000 IU daily

## 2016-07-22 NOTE — Assessment & Plan Note (Signed)
Asymptomatic except occasional right elbow pain at times.

## 2016-07-22 NOTE — Assessment & Plan Note (Signed)
hgba1c acceptable, minimize simple carbs. Increase exercise as tolerated.  

## 2016-07-22 NOTE — Assessment & Plan Note (Signed)
On Levothyroxine, continue to monitor 

## 2016-07-23 ENCOUNTER — Telehealth: Payer: Self-pay | Admitting: Family Medicine

## 2016-07-23 ENCOUNTER — Other Ambulatory Visit: Payer: Self-pay | Admitting: *Deleted

## 2016-07-23 DIAGNOSIS — L089 Local infection of the skin and subcutaneous tissue, unspecified: Principal | ICD-10-CM

## 2016-07-23 DIAGNOSIS — B958 Unspecified staphylococcus as the cause of diseases classified elsewhere: Secondary | ICD-10-CM

## 2016-07-23 MED ORDER — CEFDINIR 300 MG PO CAPS: 300.0000 mg | ORAL_CAPSULE | Freq: Two times a day (BID) | ORAL | 0 refills | Status: DC

## 2016-07-23 NOTE — Telephone Encounter (Signed)
[facility], [place] [phone] (Phone) [phone] (Fax)    Reason for call:  Pharmacy in need of clarification regarding cefdinir (OMNICEF) 300 MG capsule

## 2016-07-23 NOTE — Telephone Encounter (Signed)
New rx sent in for 7 days only

## 2016-07-24 ENCOUNTER — Other Ambulatory Visit: Payer: Self-pay | Admitting: Family Medicine

## 2016-07-24 ENCOUNTER — Encounter: Payer: Self-pay | Admitting: Family Medicine

## 2016-07-24 DIAGNOSIS — L089 Local infection of the skin and subcutaneous tissue, unspecified: Principal | ICD-10-CM

## 2016-07-24 DIAGNOSIS — B958 Unspecified staphylococcus as the cause of diseases classified elsewhere: Secondary | ICD-10-CM

## 2016-07-24 MED ORDER — CEFDINIR 300 MG PO CAPS: 300.0000 mg | ORAL_CAPSULE | Freq: Two times a day (BID) | ORAL | 0 refills | Status: DC

## 2016-07-25 ENCOUNTER — Encounter: Payer: Self-pay | Admitting: Pharmacist

## 2016-07-25 DIAGNOSIS — D6859 Other primary thrombophilia: Secondary | ICD-10-CM

## 2016-07-25 DIAGNOSIS — I82509 Chronic embolism and thrombosis of unspecified deep veins of unspecified lower extremity: Secondary | ICD-10-CM

## 2016-07-25 DIAGNOSIS — D689 Coagulation defect, unspecified: Secondary | ICD-10-CM

## 2016-07-25 DIAGNOSIS — I2699 Other pulmonary embolism without acute cor pulmonale: Secondary | ICD-10-CM

## 2016-07-25 DIAGNOSIS — Z7901 Long term (current) use of anticoagulants: Secondary | ICD-10-CM

## 2016-07-25 LAB — POCT INR: INR: 2.3

## 2016-07-25 NOTE — Patient Instructions (Signed)
Patient educated about medication as defined in this encounter and verbalized understanding by repeating back instructions provided.   

## 2016-07-25 NOTE — Progress Notes (Signed)
Reviewed thx DrG 

## 2016-07-25 NOTE — Progress Notes (Signed)
Anticoagulation Management Paul Ryan is a 71 y.o. male who was contacted for monitoring of warfarin treatment.    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 Supervising physician: Murriel Hopper  Anticoagulation Clinic Visit History: Patient does not report signs/symptoms of bleeding or thromboembolism  Anticoagulation Episode Summary    Current INR goal:   2.0-3.0  TTR:   69.1 % (1.9 y)  Next INR check:   07/29/2016  INR from last check:   2.3 (07/25/2016)  Weekly max warfarin dose:     Target end date:     INR check location:     Preferred lab:     Send INR reminders to:      Indications   Chronic anticoagulation [Z79.01] Coagulopathy ([place]) [[name]] DVT (deep venous thrombosis) ([name]) [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 Beryle Beams patient Take enoxaparin 120 mg daily   Anticoagulation Warfarin Dose Instructions as of 07/25/2016      Total [name]   New Dose 26 mg 4 mg 4 mg 4 mg 4 mg 4 mg 4 mg 2 mg     (4 mg x 1)  (4 mg x 1)  (4 mg x 1)  (4 mg x 1)  (4 mg x 1)  (4 mg x 1)  (4 mg x 0.5)                         Description   Dr. Beryle Beams patient     INR today: Therapeutic  PLAN Weekly dose was unchanged, follow up next week due to patient starting a 10-day course of cefdinir  There are no Patient Instructions on file for this visit. Patient advised to contact clinic  or seek medical attention if signs/symptoms of bleeding or thromboembolism occur.  Patient verbalized understanding by repeating back information and was advised to contact me if further medication-related questions arise. Patient was also provided an information handout.  Follow-up Return in about 4 days (around 07/29/2016).  Paul Ryan

## 2016-08-04 ENCOUNTER — Ambulatory Visit: Payer: Medicare Other

## 2016-08-04 DIAGNOSIS — D6859 Other primary thrombophilia: Secondary | ICD-10-CM

## 2016-08-04 DIAGNOSIS — I2699 Other pulmonary embolism without acute cor pulmonale: Secondary | ICD-10-CM

## 2016-08-04 DIAGNOSIS — I82509 Chronic embolism and thrombosis of unspecified deep veins of unspecified lower extremity: Secondary | ICD-10-CM

## 2016-08-04 DIAGNOSIS — Z7901 Long term (current) use of anticoagulants: Secondary | ICD-10-CM

## 2016-08-04 DIAGNOSIS — D689 Coagulation defect, unspecified: Secondary | ICD-10-CM

## 2016-08-04 LAB — POCT INR: INR: 2.1

## 2016-08-04 NOTE — Patient Instructions (Addendum)
Anticoagulation Management Paul Ryan is a 71 y.o. male who reports to the clinic for monitoring of warfarin treatment.     Indication: Protein S deficiency  Duration: indefinite Supervising physician: Murriel Hopper  Anticoagulation Clinic Visit History: Patient does not report signs/symptoms of bleeding or thromboembolism   @ANTICOAGSUMMARY @ ASSESSMENT Recent Results: The most recent result is correlated with 28 mg per week: Lab Results  Component Value Date   INR 2.1 08/04/2016   INR 2.3 07/25/2016   INR 1.7 07/22/2016   PROTIME 15.6 (H) 05/17/2013    Anticoagulation Dosing: INR as of 08/04/2016 and Previous Warfarin Dosing Information    INR Dt INR Goal [name] [name]   08/04/2016 2.1 2.0-3.0 26 mg 4 mg 4 mg 4 mg 4 mg 4 mg 4 mg 2 mg    Previous description   Dr. Beryle Beams patient   Anticoagulation Warfarin Dose Instructions as of 08/04/2016      Total [name]   New Dose 28 mg 4 mg 4 mg 4 mg 4 mg 4 mg 4 mg 4 mg     (4 mg x 1)  (4 mg x 1)  (4 mg x 1)  (4 mg x 1)  (4 mg x 1)  (4 mg x 1)  (4 mg x 1)                         Description   Dr. Beryle Beams patient     INR today: Therapeutic  PLAN Weekly dose was unchanged  @PATINSTR @ Patient advised to contact clinic or seek medical attention if signs/symptoms of bleeding or thromboembolism occur.  Patient verbalized understanding by repeating back information and was advised to contact me if further medication-related questions arise. Patient was also provided an information handout.  Follow-up Return in about 1 week (around 08/11/2016) for INR follow up.   Gwenlyn Perking, PharmD PGY1 Pharmacy Resident 08/04/2016 9:32 AM

## 2016-08-04 NOTE — Progress Notes (Signed)
Patient was seen in clinic with Gwenlyn Perking, PharmD, PGY1 pharmacy resident. I agree with the assessment and plan of care documented.

## 2016-08-04 NOTE — Progress Notes (Signed)
Reviewed Thanks DrG 

## 2016-08-12 ENCOUNTER — Encounter: Payer: Self-pay | Admitting: Pharmacist

## 2016-08-12 DIAGNOSIS — D689 Coagulation defect, unspecified: Secondary | ICD-10-CM

## 2016-08-12 DIAGNOSIS — I82509 Chronic embolism and thrombosis of unspecified deep veins of unspecified lower extremity: Secondary | ICD-10-CM

## 2016-08-12 DIAGNOSIS — I2699 Other pulmonary embolism without acute cor pulmonale: Secondary | ICD-10-CM

## 2016-08-12 DIAGNOSIS — D6859 Other primary thrombophilia: Secondary | ICD-10-CM

## 2016-08-12 DIAGNOSIS — Z7901 Long term (current) use of anticoagulants: Secondary | ICD-10-CM

## 2016-08-12 LAB — POCT INR: INR: 2.8

## 2016-08-12 NOTE — Patient Instructions (Signed)
Patient educated about medication as defined in this encounter and verbalized understanding by repeating back instructions provided.   

## 2016-08-12 NOTE — Progress Notes (Signed)
Anticoagulation Management Paul Ryan is a 71 y.o. male who was contacted for monitoring of warfarin treatment. Patient used home coaguchek XS machine for INR.   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 Supervising physician: Murriel Hopper  Anticoagulation Clinic Visit History: Patient does not report signs/symptoms of bleeding or thromboembolism. 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 Beryle Beams patient   Anticoagulation Warfarin Dose Instructions as of 08/12/2016      Total [name]   New Dose 32 mg 4 mg 4 mg 4 mg 4 mg 4 mg 4 mg 8 mg     (4 mg x 1)  (4 mg x 1)  (4 mg x 1)  (4 mg x 1)  (4 mg x 1)  (4 mg x 1)  (4 mg x 2)                         Description   Dr. Beryle Beams patient     INR today: Therapeutic  PLAN Resume dose taken prior to antibiotic therapy (28 mg), re-check 1 week  Patient Instructions  Patient educated about  medication as defined in this encounter and verbalized understanding by repeating back instructions provided.   Patient advised to contact clinic or seek medical attention if signs/symptoms of bleeding or thromboembolism occur.  Patient verbalized understanding by repeating back information and was advised to contact me if further medication-related questions arise. Patient was also provided an information handout.  Follow-up Return in about 1 week (around 08/19/2016).  [name]

## 2016-08-12 NOTE — Progress Notes (Signed)
Reviewed thx DrG 

## 2016-08-19 LAB — PROTIME-INR

## 2016-08-29 ENCOUNTER — Encounter: Payer: Self-pay | Admitting: Pharmacist

## 2016-08-29 DIAGNOSIS — D689 Coagulation defect, unspecified: Secondary | ICD-10-CM

## 2016-08-29 DIAGNOSIS — D6859 Other primary thrombophilia: Secondary | ICD-10-CM

## 2016-08-29 DIAGNOSIS — Z7901 Long term (current) use of anticoagulants: Secondary | ICD-10-CM

## 2016-08-29 DIAGNOSIS — I2699 Other pulmonary embolism without acute cor pulmonale: Secondary | ICD-10-CM

## 2016-08-29 DIAGNOSIS — I82509 Chronic embolism and thrombosis of unspecified deep veins of unspecified lower extremity: Secondary | ICD-10-CM

## 2016-08-29 LAB — POCT INR: INR: 2.5

## 2016-08-29 NOTE — Progress Notes (Signed)
Reviewed thx DrG 

## 2016-08-29 NOTE — Patient Instructions (Signed)
Patient educated about medication as defined in this encounter and verbalized understanding by repeating back instructions provided.   

## 2016-08-29 NOTE — Progress Notes (Signed)
Anticoagulation Management Erek Kowal is a 71 y.o. male who was contacted for monitoring of warfarin treatment. Patient used home coaguchek XS machine for INR.   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 Supervising physician: Murriel Hopper  Anticoagulation Clinic Visit History: Patient does not report signs/symptoms of bleeding or thromboembolism. 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 Beryle Beams patient   Anticoagulation Warfarin Dose Instructions as of 08/29/2016      Total [name]   New Dose 32 mg 4 mg 4 mg 4 mg 4 mg 4 mg 4 mg 8 mg     (4 mg x 1)  (4 mg x 1)  (4 mg x 1)  (4 mg x 1)  (4 mg x 1)  (4 mg x 1)  (4 mg x 2)                         Description   Dr. Beryle Beams patient     INR today: Therapeutic  PLAN Weekly dose was unchanged   Patient Instructions  Patient educated about medication as defined in this encounter and  verbalized understanding by repeating back instructions provided.   Patient advised to contact clinic or seek medical attention if signs/symptoms of bleeding or thromboembolism occur.  Patient verbalized understanding by repeating back information and was advised to contact me if further medication-related questions arise. Patient was also provided an information handout.  Follow-up 2 weeks  [name]

## 2016-09-04 LAB — PROTIME-INR

## 2016-09-16 ENCOUNTER — Encounter: Payer: Self-pay | Admitting: Pharmacist

## 2016-09-16 ENCOUNTER — Telehealth: Payer: Self-pay

## 2016-09-16 DIAGNOSIS — D689 Coagulation defect, unspecified: Secondary | ICD-10-CM

## 2016-09-16 DIAGNOSIS — I2699 Other pulmonary embolism without acute cor pulmonale: Secondary | ICD-10-CM

## 2016-09-16 DIAGNOSIS — I82509 Chronic embolism and thrombosis of unspecified deep veins of unspecified lower extremity: Secondary | ICD-10-CM

## 2016-09-16 DIAGNOSIS — Z7901 Long term (current) use of anticoagulants: Secondary | ICD-10-CM

## 2016-09-16 DIAGNOSIS — D6859 Other primary thrombophilia: Secondary | ICD-10-CM

## 2016-09-16 LAB — POCT INR: INR: 1.7

## 2016-09-16 NOTE — Telephone Encounter (Signed)
Coagucheck called to report out of range INR 1.7 drawn 09/16/2016 verbal results given to Morrison Old RN

## 2016-09-16 NOTE — Patient Instructions (Signed)
Patient educated about medication as defined in this encounter and verbalized understanding by repeating back instructions provided.   

## 2016-09-16 NOTE — Progress Notes (Signed)
Anticoagulation Management Paul Ryan a 71 y.o.malewho was contactedfor monitoring of warfarintreatment. Patient used home coaguchek XS machine for INR.  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 Supervising physician: Paul Ryan  Anticoagulation Clinic Visit History: Patient does notreport signs/symptoms of bleeding or thromboembolism. Patient reports completing a course of cefdinir therapy  Anticoagulation Episode Summary    Current INR goal:   2.0-3.0  TTR:   70.3 % (2.1 y)  Next INR check:   09/19/2016  INR from last check:   1.7! 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       Previous description   Dr. Beryle Ryan patient   Anticoagulation Warfarin Dose Instructions as of 09/16/2016      Total [name]   New Dose 28 mg 4 mg 4 mg 4 mg 4 mg 4 mg 4 mg 4 mg     (4 mg x 1)  (4 mg x 1)  (4 mg x 1)  (4 mg x 1)  (4 mg x 1)  (4 mg x 1)  (4 mg x 1)   Description   Dr. Beryle Ryan patient     INR today: Subtherapeutic  PLAN Start enoxaparin injections 1.5 mg daily. Take 6 mg today, then change weekly dose to 28 mg (4 mg daily). Re-check INR 3 days  Patient Instructions   Patient educated about medication as defined in this encounter and verbalized understanding by repeating back instructions provided.    Patient advised to contact clinic or seek medical attention if signs/symptoms of bleeding or thromboembolism occur.  Patient verbalized understanding by repeating back information and was advised to contact me if further medication-related questions arise. Patient was also provided an information handout.  Follow-up Return in about 3 days (around 09/19/2016).  Paul Ryan

## 2016-09-16 NOTE — Telephone Encounter (Signed)
Pt stated he called and talked to Dr Maudie Mercury this morning about INR 1.7 and gave him instructions.

## 2016-09-17 NOTE — Progress Notes (Signed)
Reviewed Thanks DrG 

## 2016-09-19 ENCOUNTER — Ambulatory Visit (INDEPENDENT_AMBULATORY_CARE_PROVIDER_SITE_OTHER): Payer: Medicare Other | Admitting: Pharmacist

## 2016-09-19 DIAGNOSIS — I2699 Other pulmonary embolism without acute cor pulmonale: Secondary | ICD-10-CM

## 2016-09-19 DIAGNOSIS — I82509 Chronic embolism and thrombosis of unspecified deep veins of unspecified lower extremity: Secondary | ICD-10-CM | POA: Diagnosis not present

## 2016-09-19 DIAGNOSIS — D6859 Other primary thrombophilia: Secondary | ICD-10-CM

## 2016-09-19 DIAGNOSIS — Z7901 Long term (current) use of anticoagulants: Secondary | ICD-10-CM | POA: Diagnosis not present

## 2016-09-19 DIAGNOSIS — D688 Other specified coagulation defects: Secondary | ICD-10-CM

## 2016-09-19 DIAGNOSIS — D689 Coagulation defect, unspecified: Secondary | ICD-10-CM

## 2016-09-19 LAB — POCT INR: INR: 2.1

## 2016-09-19 NOTE — Patient Instructions (Signed)
Patient educated about medication as defined in this encounter and verbalized understanding by repeating back instructions provided.   

## 2016-09-19 NOTE — Progress Notes (Signed)
Reviewed Thanks DrG 

## 2016-09-19 NOTE — Progress Notes (Signed)
Anticoagulation Management Paul Ryan a 71 y.o.malewho was contactedfor monitoring of warfarintreatment. Patient used home coaguchek XS machine for INR.  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 Supervising physician: Murriel Hopper  Anticoagulation Clinic Visit History: Patient does notreport signs/symptoms of bleeding or thromboembolism.   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 Beryle Beams patient   Anticoagulation Warfarin Dose Instructions as of 09/19/2016      Total [name]   New Dose 28 mg 4 mg 4 mg 4 mg 4 mg 4 mg 4 mg 4 mg     (4 mg x 1)  (4 mg x 1)  (4 mg x 1)  (4 mg x 1)  (4 mg x 1)  (4 mg x 1)  (4 mg x 1)                         Description   Dr. Beryle Beams patient     INR today: Therapeutic  PLAN Weekly dose was unchanged  Patient Instructions  Patient educated about medication as defined in this encounter and verbalized understanding by repeating back instructions  provided.   Patient advised to contact clinic or seek medical attention if signs/symptoms of bleeding or thromboembolism occur.  Patient verbalized understanding by repeating back information and was advised to contact me if further medication-related questions arise. Patient was also provided an information handout.  Follow-up Return in about 1 week (around 09/26/2016).  [name]

## 2016-09-25 LAB — PROTIME-INR

## 2016-09-26 ENCOUNTER — Other Ambulatory Visit: Payer: Medicare Other

## 2016-09-26 ENCOUNTER — Telehealth: Payer: Self-pay | Admitting: Family Medicine

## 2016-09-26 NOTE — Telephone Encounter (Signed)
Recd call from [facility] lab that Paul Ryan was in office for labs before visit 09/30/16. I was advised by lab tech no orders are in. Shirlean Mylar was unable to find documentation that Paul Ryan is due for labs prior to f/u visit schedule. Paul Ryan requesting a mychart msg to let him know if he needs to come back in for labs prior to appt or if he should come to appt and have labs after.

## 2016-09-30 ENCOUNTER — Encounter: Payer: Self-pay | Admitting: Family Medicine

## 2016-09-30 ENCOUNTER — Ambulatory Visit (INDEPENDENT_AMBULATORY_CARE_PROVIDER_SITE_OTHER): Payer: Medicare Other | Admitting: Family Medicine

## 2016-09-30 DIAGNOSIS — E039 Hypothyroidism, unspecified: Secondary | ICD-10-CM | POA: Diagnosis not present

## 2016-09-30 DIAGNOSIS — R3911 Hesitancy of micturition: Secondary | ICD-10-CM

## 2016-09-30 DIAGNOSIS — R739 Hyperglycemia, unspecified: Secondary | ICD-10-CM | POA: Diagnosis not present

## 2016-09-30 DIAGNOSIS — I82509 Chronic embolism and thrombosis of unspecified deep veins of unspecified lower extremity: Secondary | ICD-10-CM | POA: Diagnosis not present

## 2016-09-30 DIAGNOSIS — E785 Hyperlipidemia, unspecified: Secondary | ICD-10-CM | POA: Diagnosis not present

## 2016-09-30 HISTORY — DX: Hesitancy of micturition: R39.11

## 2016-09-30 MED ORDER — THYROID 60 MG PO TABS: ORAL_TABLET | ORAL | 2 refills | Status: DC

## 2016-09-30 MED ORDER — THYROID 90 MG PO TABS: ORAL_TABLET | ORAL | 2 refills | Status: DC

## 2016-09-30 NOTE — Assessment & Plan Note (Signed)
Tolerating statin, encouraged heart healthy diet, avoid trans fats, minimize simple carbs and saturated fats. Increase exercise as tolerated 

## 2016-09-30 NOTE — Assessment & Plan Note (Signed)
Tolerating /armour Thyroid

## 2016-09-30 NOTE — Patient Instructions (Addendum)
Labs at [facility] 9/24. Then appt with Dr B Shingrix new shingles shot 2 shots over 6 months to prevent shingles  Carbohydrate Counting for Diabetes Mellitus, Adult Carbohydrate counting is a method for keeping track of how many carbohydrates you eat. Eating carbohydrates naturally increases the amount of sugar (glucose) in the blood. Counting how many carbohydrates you eat helps keep your blood glucose within normal limits, which helps you manage your diabetes (diabetes mellitus). It is important to know how many carbohydrates you can safely have in each meal. This is different for every person. A diet and nutrition specialist (registered dietitian) can help you make a meal plan and calculate how many carbohydrates you should have at each meal and snack. Carbohydrates are found in the following foods:  Grains, such as breads and cereals.  Dried beans and soy products.  Starchy vegetables, such as potatoes, peas, and corn.  Fruit and fruit juices.  Milk and yogurt.  Sweets and snack foods, such as cake, cookies, candy, chips, and soft drinks.  How do I count carbohydrates? There are two ways to count carbohydrates in food. You can use either of the methods or a combination of both. Reading "Nutrition Facts" on packaged food The "Nutrition Facts" list is included on the labels of almost all packaged foods and beverages in the [place] It includes:  The serving size.  Information about nutrients in each serving, including the grams (g) of carbohydrate per serving.  To use the "Nutrition Facts":  Decide how many servings you will have.  Multiply the number of servings by the number of carbohydrates per serving.  The resulting number is the total amount of carbohydrates that you will be having.  Learning standard serving sizes of other foods When you eat foods containing carbohydrates that are not packaged or do not include "Nutrition Facts" on the label, you need to measure the servings in  order to count the amount of carbohydrates:  Measure the foods that you will eat with a food scale or measuring cup, if needed.  Decide how many standard-size servings you will eat.  Multiply the number of servings by 15. Most carbohydrate-rich foods have about 15 g of carbohydrates per serving. ? For example, if you eat 8 oz (170 g) of strawberries, you will have eaten 2 servings and 30 g of carbohydrates (2 servings x 15 g = 30 g).  For foods that have more than one food mixed, such as soups and casseroles, you must count the carbohydrates in each food that is included.  The following list contains standard serving sizes of common carbohydrate-rich foods. Each of these servings has about 15 g of carbohydrates:   hamburger bun or  English muffin.   oz (15 mL) syrup.   oz (14 g) jelly.  1 slice of bread.  1 six-inch tortilla.  3 oz (85 g) cooked rice or pasta.  4 oz (113 g) cooked dried beans.  4 oz (113 g) starchy vegetable, such as peas, corn, or potatoes.  4 oz (113 g) hot cereal.  4 oz (113 g) mashed potatoes or  of a large baked potato.  4 oz (113 g) canned or frozen fruit.  4 oz (120 mL) fruit juice.  4-6 crackers.  6 chicken nuggets.  6 oz (170 g) unsweetened dry cereal.  6 oz (170 g) plain fat-free yogurt or yogurt sweetened with artificial sweeteners.  8 oz (240 mL) milk.  8 oz (170 g) fresh fruit or one small piece of  fruit.  24 oz (680 g) popped popcorn.  Example of carbohydrate counting Sample meal  3 oz (85 g) chicken breast.  6 oz (170 g) brown rice.  4 oz (113 g) corn.  8 oz (240 mL) milk.  8 oz (170 g) strawberries with sugar-free whipped topping. Carbohydrate calculation 1. Identify the foods that contain carbohydrates: ? Rice. ? Corn. ? Milk. ? Strawberries. 2. Calculate how many servings you have of each food: ? 2 servings rice. ? 1 serving corn. ? 1 serving milk. ? 1 serving strawberries. 3. Multiply each number of  servings by 15 g: ? 2 servings rice x 15 g = 30 g. ? 1 serving corn x 15 g = 15 g. ? 1 serving milk x 15 g = 15 g. ? 1 serving strawberries x 15 g = 15 g. 4. Add together all of the amounts to find the total grams of carbohydrates eaten: ? 30 g + 15 g + 15 g + 15 g = 75 g of carbohydrates total. This information is not intended to replace advice given to you by your health care provider. Make sure you discuss any questions you have with your health care provider. Document Released: 02/17/2005 Document Revised: 09/07/2015 Document Reviewed: 08/01/2015 Elsevier Interactive Patient Education  [employer].

## 2016-09-30 NOTE — Progress Notes (Signed)
Subjective:  I acted as a Education administrator for Dr. Charlett Blake. [place], [place]  Patient ID: Paul Ryan, male    DOB: Dec 19, 1945, 71 y.o.   MRN: [id number]  No chief complaint on file.   HPI  Patient is in today for a 3 month follow up. Patient has no acute concerns. He is struggling with the stress his wife's cancer treatments but is managing is OK. He still notes some trouble with his memory but he thinks it is stress. No recent febrile illness or recent hospitalization. Denies CP/palp/SOB/HA/congestion/fevers/GI or GU c/o. Taking meds as prescribed  Patient Care Team: [name], MD as PCP - General (Family Medicine) Roena Malady, MD (Ophthalmology) Forde Dandy, PharmD as Pharmacist (Pharmacist)   Past Medical History:  Diagnosis Date  . Acid reflux 02/03/2016  . Allergy    seasonal  . Chronic anticoagulation 06/14/2013  . Coagulopathy ([place]) 05/16/2011  . Collagen vascular disease ([place])   . DVT (deep venous thrombosis) ([name])    takes coumadin  . Dysuria 02/27/2013  . 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 Hyperglycemia 12/26/2014  . Hyperlipidemia   . Hypothyroid 06/10/2011  . Insomnia 04/24/2016  . Left hip pain 12/17/2010  . MCI (mild cognitive impairment) 07/22/2016  . Nasal lesion 07/22/2016  . Polymyalgia ([place]) 03/21/2014  . 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 Preventative health care 12/17/2010  . Shingles 1982   right abdominal wall  . Staph skin infection 07/22/2016  . Thyroid nodule 11/29/2014  . Urinary hesitancy 09/30/2016  . Vitamin D deficiency 10/14/2011    Past Surgical History:  Procedure Laterality Date  . 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 Cancer Mother        unknown- everywhere  . Cancer Sister        breast    Social History   Social History  . Marital status: Married    Spouse name: N/A  . Number of children: N/A  . Years of education: N/A    Occupational History  . Not on file.   Social History Main Topics  . Smoking status: Former Smoker    Packs/day: 1.00    Years: 20.00    Types: Cigarettes    Quit date: 03/03/1980  . Smokeless tobacco: Never Used  . Alcohol use No  . Drug use: No  . Sexual activity: Not on file   Other Topics Concern  . Not on file   Social History Narrative  . No narrative on file    Outpatient Medications Prior to Visit  Medication Sig Dispense Refill  . albuterol (PROVENTIL HFA;VENTOLIN HFA) 108 (90 Base) MCG/ACT inhaler Inhale 2 puffs into the lungs every 4 (four) hours as needed for wheezing or shortness of breath. 1 Inhaler 3  . b complex vitamins tablet Take 1 tablet by mouth daily.      . Cholecalciferol 4000 UNITS TABS Take 1 tablet by mouth daily.    . Coenzyme Q10 (COQ10 PO) Take 2 tablets by mouth daily.    . famotidine (PEPCID) 40 MG tablet Take 40 mg by mouth daily.    Javier Docker Oil 300 MG CAPS 1 cap daily    . mupirocin ointment (BACTROBAN) 2 % Place 1 application into the nose 2 (two) times daily. 22 g 0  . NIACIN PO Take 1 tablet by mouth 3 (three) times daily.     [name] Kitchen  rosuvastatin (CRESTOR) 40 MG tablet Take 1 tablet (40 mg total) by mouth daily. 90 tablet 1  . tacrolimus (PROTOPIC) 0.1 % ointment Apply topically 2 (two) times daily. 100 g 1  . warfarin (COUMADIN) 4 MG tablet Take 1 tablet (4 mg total) by mouth daily. Take 1/2 tablet Wednesdays and Saturdays (Patient taking differently: Take 4 mg by mouth daily. ) 54 tablet 3  . thyroid (ARMOUR THYROID) 60 MG tablet Take 1 tablet by mouth as directed alternating with the 90 mg (Patient taking differently: Take 1 tablet by mouth (3 days a week) as directed alternating with the 90 mg) 45 tablet 2  . thyroid (ARMOUR THYROID) 90 MG tablet Take 1 tablet by mouth as directed alternating with the 60 mg (Patient taking differently: Take 1 tablet by mouth (4 days a week) as directed alternating with the 60 mg) 45 tablet 2  . cefdinir  (OMNICEF) 300 MG capsule Take 1 capsule (300 mg total) by mouth 2 (two) times daily. Take for 7 days 14 capsule 0   No facility-administered medications prior to visit.     No Known Allergies  Review of Systems  Constitutional: Negative for fever and malaise/fatigue.  HENT: Negative for congestion.   Eyes: Negative for blurred vision.  Respiratory: Negative for shortness of breath.   Cardiovascular: Negative for chest pain, palpitations and leg swelling.  Gastrointestinal: Negative for abdominal pain, blood in stool and nausea.  Genitourinary: Negative for dysuria and frequency.  Musculoskeletal: Negative for falls.  Skin: Negative for rash.  Neurological: Negative for dizziness, loss of consciousness and headaches.  Endo/Heme/Allergies: Negative for environmental allergies.  Psychiatric/Behavioral: Negative for depression. The patient is not nervous/anxious.        Objective:    Physical Exam  Constitutional: He is oriented to person, place, and time. He appears well-developed and well-nourished. No distress.  HENT:  Head: Normocephalic and atraumatic.  Nose: Nose normal.  Eyes: Right eye exhibits no discharge. Left eye exhibits no discharge.  Neck: Normal range of motion. Neck supple.  Cardiovascular: Normal rate and regular rhythm.   No murmur heard. Pulmonary/Chest: Effort normal and breath sounds normal.  Abdominal: Soft. Bowel sounds are normal. There is no tenderness.  Musculoskeletal: He exhibits no edema.  Neurological: He is alert and oriented to person, place, and time.  Skin: Skin is warm and dry.  Psychiatric: He has a normal mood and affect.  Nursing note and vitals reviewed.   There were no vitals taken for this visit. Wt Readings from Last 3 Encounters:  07/22/16 164 lb 12.8 oz (74.8 kg)  04/24/16 166 lb (75.3 kg)  01/29/16 160 lb (72.6 kg)   BP Readings from Last 3 Encounters:  07/22/16 118/76  04/24/16 108/60  01/29/16 108/66     Immunization  History  Administered Date(s) Administered  . Hepatitis A 11/09/1998  . IPV 11/09/1998  . Influenza Split 11/22/2015  . Influenza Whole 12/13/2012  . Influenza,inj,Quad PF,36+ Mos 10/25/2013, 12/26/2014  . Influenza-Unspecified 12/02/2011  . PPD Test 03/23/2015  . Pneumococcal Conjugate-13 10/25/2013  . Pneumococcal Polysaccharide-23 06/19/2009, 12/26/2014  . Td 03/03/1990, 07/01/2001  . Tdap 12/17/2010  . Typhoid Inactivated 11/09/1998, 03/09/2001  . Zoster 08/01/2008    Health Maintenance  Topic Date Due  . FOOT EXAM  08/24/1954  . OPHTHALMOLOGY EXAM  08/24/1954  . URINE MICROALBUMIN  08/24/1954  . INFLUENZA VACCINE  10/01/2016  . HEMOGLOBIN A1C  01/18/2017  . TETANUS/TDAP  12/16/2020  . COLONOSCOPY  06/20/2026  .  Hepatitis C Screening  Completed  . PNA vac Low Risk Adult  Completed    Lab Results  Component Value Date   WBC 6.8 07/18/2016   HGB 15.0 07/18/2016   HCT 45.3 07/18/2016   PLT 195.0 07/18/2016   GLUCOSE 109 (H) 07/18/2016   CHOL 237 (H) 07/18/2016   TRIG 191.0 (H) 07/18/2016   HDL 40.50 07/18/2016   LDLDIRECT 158.0 03/11/2013   LDLCALC 158 (H) 07/18/2016   ALT 21 07/18/2016   AST 19 07/18/2016   NA 142 07/18/2016   K 4.6 07/18/2016   CL 107 07/18/2016   CREATININE 1.10 07/18/2016   BUN 22 07/18/2016   CO2 29 07/18/2016   TSH 1.38 07/18/2016   PSA 1.53 07/18/2016   INR 2.1 09/19/2016   HGBA1C 6.3 07/18/2016    Lab Results  Component Value Date   TSH 1.38 07/18/2016   Lab Results  Component Value Date   WBC 6.8 07/18/2016   HGB 15.0 07/18/2016   HCT 45.3 07/18/2016   MCV 90.7 07/18/2016   PLT 195.0 07/18/2016   Lab Results  Component Value Date   NA 142 07/18/2016   K 4.6 07/18/2016   CHLORIDE 109 11/16/2012   CO2 29 07/18/2016   GLUCOSE 109 (H) 07/18/2016   BUN 22 07/18/2016   CREATININE 1.10 07/18/2016   BILITOT 0.4 07/18/2016   ALKPHOS 54 07/18/2016   AST 19 07/18/2016   ALT 21 07/18/2016   PROT 7.1 07/18/2016   ALBUMIN  4.1 07/18/2016   CALCIUM 9.3 07/18/2016   ANIONGAP 8 09/11/2014   GFR 69.96 07/18/2016   Lab Results  Component Value Date   CHOL 237 (H) 07/18/2016   Lab Results  Component Value Date   HDL 40.50 07/18/2016   Lab Results  Component Value Date   LDLCALC 158 (H) 07/18/2016   Lab Results  Component Value Date   TRIG 191.0 (H) 07/18/2016   Lab Results  Component Value Date   CHOLHDL 6 07/18/2016   Lab Results  Component Value Date   HGBA1C 6.3 07/18/2016         Assessment & Plan:   Problem List Items Addressed This Visit    DVT (deep venous thrombosis) ([place])    No recent concerning symptoms. Tolerating Warfarin       Hyperlipidemia    Tolerating statin, encouraged heart healthy diet, avoid trans fats, minimize simple carbs and saturated fats. Increase exercise as tolerated      Relevant Orders   Lipid panel   Hypothyroid    Tolerating /armour Thyroid      Relevant Medications   thyroid (ARMOUR THYROID) 60 MG tablet   thyroid (ARMOUR THYROID) 90 MG tablet   Other Relevant Orders   TSH   Hyperglycemia    hgba1c acceptable, minimize simple carbs. Increase exercise as tolerated.       Relevant Orders   Comprehensive metabolic panel   Hemoglobin A1c   Urinary hesitancy    Agrees to see urology due to recent dysuria and hesitancy.       Relevant Orders   CBC   TSH      I have discontinued Mr. Vogelsang cefdinir. I am also having him maintain his b complex vitamins, Krill Oil, tacrolimus, NIACIN PO, Cholecalciferol, Coenzyme Q10 (COQ10 PO), albuterol, famotidine, warfarin, mupirocin ointment, rosuvastatin, thyroid, and thyroid.  Meds ordered this encounter  Medications  . thyroid (ARMOUR THYROID) 60 MG tablet    Sig: Take 1 tablet by mouth as directed  alternating with the 90 mg    Dispense:  45 tablet    Refill:  2  . thyroid (ARMOUR THYROID) 90 MG tablet    Sig: Take 1 tablet by mouth as directed alternating with the 60 mg    Dispense:  50 tablet     Refill:  2    CMA served as scribe during this visit. History, Physical and Plan performed by medical provider. Documentation and orders reviewed and attested to.  Penni Homans, MD

## 2016-09-30 NOTE — Assessment & Plan Note (Signed)
Agrees to see urology due to recent dysuria and hesitancy.

## 2016-09-30 NOTE — Assessment & Plan Note (Addendum)
No recent concerning symptoms. Tolerating Warfarin

## 2016-09-30 NOTE — Assessment & Plan Note (Signed)
hgba1c acceptable, minimize simple carbs. Increase exercise as tolerated.  

## 2016-10-01 NOTE — Telephone Encounter (Signed)
He has been seen and labs ordered

## 2016-10-06 ENCOUNTER — Other Ambulatory Visit: Payer: Self-pay | Admitting: Family Medicine

## 2016-10-06 ENCOUNTER — Telehealth (INDEPENDENT_AMBULATORY_CARE_PROVIDER_SITE_OTHER): Payer: Medicare Other | Admitting: Pharmacist

## 2016-10-06 ENCOUNTER — Encounter: Payer: Self-pay | Admitting: Family Medicine

## 2016-10-06 DIAGNOSIS — I2699 Other pulmonary embolism without acute cor pulmonale: Secondary | ICD-10-CM

## 2016-10-06 DIAGNOSIS — Z7901 Long term (current) use of anticoagulants: Secondary | ICD-10-CM

## 2016-10-06 DIAGNOSIS — R3911 Hesitancy of micturition: Secondary | ICD-10-CM

## 2016-10-06 DIAGNOSIS — D6859 Other primary thrombophilia: Secondary | ICD-10-CM

## 2016-10-06 DIAGNOSIS — D689 Coagulation defect, unspecified: Secondary | ICD-10-CM

## 2016-10-06 DIAGNOSIS — D688 Other specified coagulation defects: Secondary | ICD-10-CM | POA: Diagnosis not present

## 2016-10-06 DIAGNOSIS — I82509 Chronic embolism and thrombosis of unspecified deep veins of unspecified lower extremity: Secondary | ICD-10-CM | POA: Diagnosis not present

## 2016-10-06 LAB — POCT INR: INR: 2.6

## 2016-10-06 MED ORDER — WARFARIN SODIUM 4 MG PO TABS: 4.0000 mg | ORAL_TABLET | Freq: Every day | ORAL | 3 refills | Status: DC

## 2016-10-06 NOTE — Telephone Encounter (Signed)
Reviewed thx DrG 

## 2016-10-06 NOTE — Telephone Encounter (Signed)
Anticoagulation Management Paul Ryan a 71 y.o.malewho was contactedfor monitoring of warfarintreatment. Patient used home coaguchek XS machine for INR.  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 Supervising physician: Murriel Hopper  Anticoagulation Clinic Visit History: Patient does not report signs/symptoms of bleeding or thromboembolism  No Known Allergies Medication Sig  albuterol (PROVENTIL HFA;VENTOLIN HFA) 108 (90 Base) MCG/ACT inhaler Inhale 2 puffs into the lungs every 4 (four) hours as needed for wheezing or shortness of breath.  b complex vitamins tablet Take 1 tablet by mouth daily.    Cholecalciferol 4000 UNITS TABS Take 1 tablet by mouth daily.  Coenzyme Q10 (COQ10 PO) Take 2 tablets by mouth daily.  famotidine (PEPCID) 40 MG tablet Take 40 mg by mouth daily.  Krill Oil 300 MG CAPS 1 cap daily  mupirocin ointment (BACTROBAN) 2 % Place 1 application into the nose 2 (two) times daily.  NIACIN PO Take 1 tablet by mouth 3 (three) times daily.   rosuvastatin (CRESTOR) 40 MG tablet Take 1 tablet (40 mg total) by mouth daily.  tacrolimus (PROTOPIC) 0.1 % ointment Apply topically 2 (two) times daily.  thyroid (ARMOUR THYROID) 60 MG tablet Take 1 tablet by mouth as directed alternating with the 90 mg  thyroid (ARMOUR THYROID) 90 MG tablet Take 1 tablet by mouth as directed alternating with the 60 mg  warfarin (COUMADIN) 4 MG tablet Take 1 tablet (4 mg total) by mouth daily.   Past Medical History:  Diagnosis Date  . Acid reflux 02/03/2016  . Allergy    seasonal  . Chronic anticoagulation 06/14/2013  . Coagulopathy ([place]) 05/16/2011  . Collagen vascular disease ([place])   . DVT (deep venous thrombosis) ([name])    takes coumadin  . Dysuria 02/27/2013  . 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 Hyperglycemia 12/26/2014  . Hyperlipidemia   . Hypothyroid 06/10/2011  . Insomnia 04/24/2016  . Left hip pain 12/17/2010  . MCI (mild cognitive  impairment) 07/22/2016  . Nasal lesion 07/22/2016  . Polymyalgia ([place]) 03/21/2014  . 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 Preventative health care 12/17/2010  . Shingles 1982   right abdominal wall  . Staph skin infection 07/22/2016  . Thyroid nodule 11/29/2014  . Urinary hesitancy 09/30/2016  . Vitamin D deficiency 10/14/2011   Social History   Social History  . Marital status: Married    Spouse name: N/A  . Number of children: N/A  . Years of education: N/A   Social History Main Topics  . Smoking status: Former Smoker    Packs/day: 1.00    Years: 20.00    Types: Cigarettes    Quit date: 03/03/1980  . Smokeless tobacco: Never Used  . Alcohol use No  . Drug use: No  . Sexual activity: Not on file   Other Topics Concern  . Not on file   Social History Narrative  . No narrative on file   Family History  Problem Relation Age of Onset  . Cancer Mother        unknown- everywhere  . Cancer Sister        breast   ASSESSMENT Recent Results: Lab Results  Component Value Date   INR 2.6 10/06/2016   INR 2.1 09/19/2016   INR 1.7 09/16/2016   PROTIME 15.6 (H) 05/17/2013   Anticoagulation Dosing: INR as of 10/06/2016 and Previous Warfarin Dosing Information    INR Dt INR Goal [name] [name]   10/06/2016 2.6  2.0-3.0 28 mg 4 mg 4 mg 4 mg 4 mg 4 mg 4 mg 4 mg    Previous description   Dr. Beryle Beams patient   Anticoagulation Warfarin Dose Instructions as of 10/06/2016      Total [name]   New Dose 28 mg 4 mg 4 mg 4 mg 4 mg 4 mg 4 mg 4 mg     (4 mg x 1)  (4 mg x 1)  (4 mg x 1)  (4 mg x 1)  (4 mg x 1)  (4 mg x 1)  (4 mg x 1)                         Description   Dr. Beryle Beams patient     INR today: Therapeutic  PLAN Weekly dose was unchanged  Patient Instructions  Patient educated about medication as defined in this encounter and verbalized understanding by repeating back instructions provided.   Patient  advised to contact clinic or seek medical attention if signs/symptoms of bleeding or thromboembolism occur.  Patient verbalized understanding by repeating back information and was advised to contact me if further medication-related questions arise. Patient was also provided an information handout.  Follow-up Return in about 2 weeks (around 10/20/2016).  [name]

## 2016-10-06 NOTE — Patient Instructions (Signed)
Patient educated about medication as defined in this encounter and verbalized understanding by repeating back instructions provided.   

## 2016-10-21 ENCOUNTER — Encounter: Payer: Self-pay | Admitting: Pharmacist

## 2016-10-21 DIAGNOSIS — I2699 Other pulmonary embolism without acute cor pulmonale: Secondary | ICD-10-CM

## 2016-10-21 DIAGNOSIS — Z7901 Long term (current) use of anticoagulants: Secondary | ICD-10-CM

## 2016-10-21 DIAGNOSIS — D689 Coagulation defect, unspecified: Secondary | ICD-10-CM

## 2016-10-21 DIAGNOSIS — D6859 Other primary thrombophilia: Secondary | ICD-10-CM

## 2016-10-21 DIAGNOSIS — I82509 Chronic embolism and thrombosis of unspecified deep veins of unspecified lower extremity: Secondary | ICD-10-CM

## 2016-10-21 LAB — POCT INR: INR: 2.6

## 2016-10-21 NOTE — Progress Notes (Signed)
Anticoagulation Management Paul Ryan a 71 y.o.malewho was contactedfor monitoring of warfarintreatment. Patient used home coaguchek XS machine for INR.  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 Supervising physician: Murriel Hopper  Anticoagulation Clinic Visit History: Patient does not report signs/symptoms of bleeding or thromboembolism  Anticoagulation Episode Summary    Current INR goal:   2.0-3.0  TTR:   71.4 % (2.2 y)  Next INR check:   11/04/2016  INR from last check:   2.4 (10/21/2016)  Weekly max warfarin dose:     Target end date:     INR check location:     Preferred lab:     Send INR reminders to:      Indications   Chronic anticoagulation [Z79.01] Coagulopathy ([place]) [[name]] DVT (deep venous thrombosis) ([place]) [[name]] Pulmonary embolism ([name]) [[name]] 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  b complex vitamins tablet Take 1 tablet by mouth daily.    Cholecalciferol 4000 UNITS TABS Take 1 tablet by mouth daily.  Coenzyme Q10 (COQ10 PO) Take 2 tablets by mouth daily.  famotidine (PEPCID) 40 MG tablet Take 40 mg by mouth daily.  Krill Oil 300 MG CAPS 1 cap daily  mupirocin ointment (BACTROBAN) 2 % Place 1 application into the nose 2 (two) times daily.  NIACIN PO Take 1 tablet by mouth 3 (three) times daily.   rosuvastatin (CRESTOR) 40 MG tablet Take 1 tablet (40 mg total) by mouth daily.  tacrolimus (PROTOPIC) 0.1 % ointment Apply topically 2 (two) times daily.  thyroid (ARMOUR THYROID) 60 MG tablet Take 1 tablet by mouth as directed alternating with the 90 mg  thyroid (ARMOUR THYROID) 90 MG tablet Take 1 tablet by mouth as directed alternating with the 60 mg  warfarin (COUMADIN) 4  MG tablet Take 1 tablet (4 mg total) by mouth daily.   Past Medical History:  Diagnosis Date  . Acid reflux 02/03/2016  . Allergy    seasonal  . Chronic anticoagulation 06/14/2013  . Coagulopathy ([place]) 05/16/2011  . Collagen vascular disease ([place])   . DVT (deep venous thrombosis) ([name])    takes coumadin  . Dysuria 02/27/2013  . 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 Hyperglycemia 12/26/2014  . Hyperlipidemia   . Hypothyroid 06/10/2011  . Insomnia 04/24/2016  . Left hip pain 12/17/2010  . MCI (mild cognitive impairment) 07/22/2016  . Nasal lesion 07/22/2016  . Polymyalgia ([place]) 03/21/2014  . 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 Preventative health care 12/17/2010  . Shingles 1982   right abdominal wall  . Staph skin infection 07/22/2016  . Thyroid nodule 11/29/2014  . Urinary hesitancy 09/30/2016  . Vitamin D deficiency 10/14/2011   Social History   Social History  . Marital status: Married    Spouse name: N/A  . Number of children: N/A  . Years of education: N/A   Social History Main Topics  . Smoking status: Former Smoker    Packs/day: 1.00    Years: 20.00    Types: Cigarettes    Quit date: 03/03/1980  . Smokeless tobacco: Never Used  . Alcohol use No  . Drug use: No  . Sexual activity: Not on file   Other Topics Concern  .  Not on file   Social History Narrative  . No narrative on file   Family History  Problem Relation Age of Onset  . Cancer Mother        unknown- everywhere  . Cancer Sister        breast    ASSESSMENT Recent Results: Lab Results  Component Value Date   INR 2.4 10/21/2016   INR 2.6 10/06/2016   INR 2.1 09/19/2016   PROTIME 15.6 (H) 05/17/2013   Anticoagulation Dosing: INR as of 10/21/2016 and Previous Warfarin Dosing Information    INR Dt INR Goal [name] [name]   10/21/2016 2.4 2.0-3.0 28 mg 4 mg 4 mg 4 mg 4 mg 4 mg 4 mg 4 mg    Previous description   Dr. Beryle Beams patient    Anticoagulation Warfarin Dose Instructions as of 10/21/2016      Total [name]   New Dose 28 mg 4 mg 4 mg 4 mg 4 mg 4 mg 4 mg 4 mg     (4 mg x 1)  (4 mg x 1)  (4 mg x 1)  (4 mg x 1)  (4 mg x 1)  (4 mg x 1)  (4 mg x 1)                         Description   Dr. Beryle Beams patient     INR today: Therapeutic  PLAN Weekly dose was unchanged  There are no Patient Instructions on file for this visit. Patient advised to contact clinic or seek medical attention if signs/symptoms of bleeding or thromboembolism occur.  Patient verbalized understanding by repeating back information and was advised to contact me if further medication-related questions arise. Patient was also provided an information handout.  Follow-up 1-2 weeks per home meter requirements  [name]

## 2016-10-21 NOTE — Patient Instructions (Signed)
Patient educated about medication as defined in this encounter and verbalized understanding by repeating back instructions provided.   

## 2016-10-21 NOTE — Progress Notes (Signed)
Reviewed thx DrG 

## 2016-10-22 LAB — PROTIME-INR

## 2016-10-28 LAB — PROTIME-INR

## 2016-10-31 ENCOUNTER — Ambulatory Visit: Payer: Medicare Other

## 2016-10-31 ENCOUNTER — Telehealth: Payer: Self-pay

## 2016-10-31 DIAGNOSIS — Z23 Encounter for immunization: Secondary | ICD-10-CM

## 2016-10-31 NOTE — Telephone Encounter (Signed)
Patient came in for FLu shot.  Patient tolerated shot very well.

## 2016-11-03 ENCOUNTER — Other Ambulatory Visit: Payer: Self-pay | Admitting: Family Medicine

## 2016-11-06 ENCOUNTER — Encounter: Payer: Self-pay | Admitting: Pharmacist

## 2016-11-06 DIAGNOSIS — I82509 Chronic embolism and thrombosis of unspecified deep veins of unspecified lower extremity: Secondary | ICD-10-CM

## 2016-11-06 DIAGNOSIS — I2699 Other pulmonary embolism without acute cor pulmonale: Secondary | ICD-10-CM

## 2016-11-06 DIAGNOSIS — D6859 Other primary thrombophilia: Secondary | ICD-10-CM

## 2016-11-06 DIAGNOSIS — D689 Coagulation defect, unspecified: Secondary | ICD-10-CM

## 2016-11-06 DIAGNOSIS — Z7901 Long term (current) use of anticoagulants: Secondary | ICD-10-CM

## 2016-11-06 LAB — POCT INR: INR: 2.3

## 2016-11-06 NOTE — Patient Instructions (Signed)
Patient educated about medication as defined in this encounter and verbalized understanding by repeating back instructions provided.   

## 2016-11-06 NOTE — Progress Notes (Signed)
Reviewed thx DrG 

## 2016-11-06 NOTE — Progress Notes (Signed)
Anticoagulation Management Paul Ryan a 71 y.o.malewho was contactedfor monitoring of warfarintreatment. Patient used home coaguchek XS machine for INR.  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 Supervising physician: Murriel Hopper  Anticoagulation Clinic Visit History: Patient does notreport signs/symptoms of bleeding or thromboembolism Anticoagulation Episode Summary    Current INR goal:   2.0-3.0  TTR:   71.9 % (2.2 y)  Next INR check:   11/20/2016  INR from last check:   2.3 (11/06/2016)  Weekly max warfarin dose:     Target end date:     INR check location:     Preferred lab:     Send INR reminders to:      Indications   Chronic anticoagulation [Z79.01] Coagulopathy ([place]) [[name]] DVT (deep venous thrombosis) ([name]) [[name]] Pulmonary embolism ([name]) [[name]] 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  ARMOUR THYROID 90 MG tablet TAKE 1 TABLET BY MOUTH AS DIRECTED ALTERNATING WITH THE 60 MG  b complex vitamins tablet Take 1 tablet by mouth daily.    Cholecalciferol 4000 UNITS TABS Take 1 tablet by mouth daily.  Coenzyme Q10 (COQ10 PO) Take 2 tablets by mouth daily.  famotidine (PEPCID) 40 MG tablet Take 40 mg by mouth daily.  Krill Oil 300 MG CAPS 1 cap daily  mupirocin ointment (BACTROBAN) 2 % Place 1 application into the nose 2 (two) times daily.  NIACIN PO Take 1 tablet by mouth 3 (three) times daily.   rosuvastatin (CRESTOR) 40 MG tablet Take 1 tablet (40 mg total) by mouth daily.  tacrolimus (PROTOPIC) 0.1 % ointment Apply topically 2 (two) times daily.  thyroid (ARMOUR THYROID) 60 MG tablet Take 1 tablet by mouth as directed alternating with the 90 mg  thyroid (ARMOUR THYROID) 90 MG  tablet Take 1 tablet by mouth as directed alternating with the 60 mg  warfarin (COUMADIN) 4 MG tablet Take 1 tablet (4 mg total) by mouth daily.   Past Medical History:  Diagnosis Date  . Acid reflux 02/03/2016  . Allergy    seasonal  . Chronic anticoagulation 06/14/2013  . Coagulopathy ([place]) 05/16/2011  . Collagen vascular disease ([place])   . DVT (deep venous thrombosis) ([name])    takes coumadin  . Dysuria 02/27/2013  . 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 Hyperglycemia 12/26/2014  . Hyperlipidemia   . Hypothyroid 06/10/2011  . Insomnia 04/24/2016  . Left hip pain 12/17/2010  . MCI (mild cognitive impairment) 07/22/2016  . Nasal lesion 07/22/2016  . Polymyalgia ([place]) 03/21/2014  . 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 Preventative health care 12/17/2010  . Shingles 1982   right abdominal wall  . Staph skin infection 07/22/2016  . Thyroid nodule 11/29/2014  . Urinary hesitancy 09/30/2016  . Vitamin D deficiency 10/14/2011   Social History   Social History  . Marital status: Married    Spouse name: N/A  . Number of children: N/A  . Years of education: N/A   Social History Main Topics  . Smoking status: Former Smoker    Packs/day: 1.00    Years: 20.00    Types: Cigarettes    Quit date: 03/03/1980  . Smokeless tobacco: Never Used  . Alcohol use No  .  Drug use: No  . Sexual activity: Not on file   Other Topics Concern  . Not on file   Social History Narrative  . No narrative on file   Family History  Problem Relation Age of Onset  . Cancer Mother        unknown- everywhere  . Cancer Sister        breast   ASSESSMENT Recent Results: Lab Results  Component Value Date   INR 2.3 11/06/2016   INR 2.6 10/21/2016   INR 2.6 10/06/2016   PROTIME 15.6 (H) 05/17/2013   Anticoagulation Dosing: INR as of 11/06/2016 and Previous Warfarin Dosing Information    INR Dt INR Goal [name] [name]   11/06/2016 2.3 2.0-3.0 28 mg 4 mg 4 mg 4 mg 4  mg 4 mg 4 mg 4 mg    Previous description   Dr. Beryle Beams patient   Anticoagulation Warfarin Dose Instructions as of 11/06/2016      Total [name]   New Dose 28 mg 4 mg 4 mg 4 mg 4 mg 4 mg 4 mg 4 mg     (4 mg x 1)  (4 mg x 1)  (4 mg x 1)  (4 mg x 1)  (4 mg x 1)  (4 mg x 1)  (4 mg x 1)                         Description   Dr. Beryle Beams patient     INR today: Therapeutic  PLAN Weekly dose was unchanged  Patient Instructions  Patient educated about medication as defined in this encounter and verbalized understanding by repeating back instructions provided.   Patient advised to contact clinic or seek medical attention if signs/symptoms of bleeding or thromboembolism occur.  Patient verbalized understanding by repeating back information and was advised to contact me if further medication-related questions arise. Patient was also provided an information handout.  Follow-up Return in about 2 weeks (around 11/20/2016).   [name]

## 2016-11-23 ENCOUNTER — Other Ambulatory Visit: Payer: Self-pay | Admitting: Family Medicine

## 2016-12-01 ENCOUNTER — Encounter: Payer: Self-pay | Admitting: Family Medicine

## 2016-12-01 ENCOUNTER — Ambulatory Visit: Payer: Medicare Other | Admitting: Family Medicine

## 2016-12-01 ENCOUNTER — Ambulatory Visit (INDEPENDENT_AMBULATORY_CARE_PROVIDER_SITE_OTHER): Payer: Medicare Other | Admitting: Family Medicine

## 2016-12-01 VITALS — BP 116/67 | HR 61 | Temp 97.9°F | Ht 72.0 in | Wt 159.0 lb

## 2016-12-01 DIAGNOSIS — R3911 Hesitancy of micturition: Secondary | ICD-10-CM

## 2016-12-01 DIAGNOSIS — F419 Anxiety disorder, unspecified: Secondary | ICD-10-CM

## 2016-12-01 DIAGNOSIS — E559 Vitamin D deficiency, unspecified: Secondary | ICD-10-CM

## 2016-12-01 DIAGNOSIS — R059 Cough, unspecified: Secondary | ICD-10-CM | POA: Insufficient documentation

## 2016-12-01 DIAGNOSIS — R1310 Dysphagia, unspecified: Secondary | ICD-10-CM

## 2016-12-01 DIAGNOSIS — R05 Cough: Secondary | ICD-10-CM

## 2016-12-01 DIAGNOSIS — R739 Hyperglycemia, unspecified: Secondary | ICD-10-CM

## 2016-12-01 DIAGNOSIS — E785 Hyperlipidemia, unspecified: Secondary | ICD-10-CM

## 2016-12-01 HISTORY — DX: Cough, unspecified: R05.9

## 2016-12-01 HISTORY — DX: Anxiety disorder, unspecified: F41.9

## 2016-12-01 MED ORDER — FAMOTIDINE 40 MG PO TABS: 40.0000 mg | ORAL_TABLET | Freq: Every day | ORAL | 5 refills | Status: DC | PRN

## 2016-12-01 MED ORDER — MONTELUKAST SODIUM 10 MG PO TABS: 10.0000 mg | ORAL_TABLET | Freq: Every day | ORAL | 3 refills | Status: DC

## 2016-12-01 MED ORDER — CETIRIZINE HCL 10 MG PO TABS: 10.0000 mg | ORAL_TABLET | Freq: Every day | ORAL | 5 refills | Status: DC | PRN

## 2016-12-01 MED ORDER — CEFDINIR 300 MG PO CAPS: 300.0000 mg | ORAL_CAPSULE | Freq: Two times a day (BID) | ORAL | 0 refills | Status: AC

## 2016-12-01 NOTE — Assessment & Plan Note (Signed)
Is having increased anxiety as his and his wife's health declines. Is leading to some trouble with sleeping. He will let us know if he would like to start medications.

## 2016-12-01 NOTE — Assessment & Plan Note (Signed)
Was seen by [facility], Dr Gloriann Loan and noted to have BPH, medications were offered but patient declines for now. Will let us know if he would like to try Flomax

## 2016-12-01 NOTE — Progress Notes (Signed)
Subjective:    Patient ID: Paul Ryan, male    DOB: 1945-09-13, 70 y.o.   MRN: [id number]  Chief Complaint  Patient presents with  . Cough    Pt states he has a history of clotting and is concerned so he wanted to come in and see Dr. Charlett Blake. Pt states he's had a cough for about 3 weeks and it could be linked to allergies.     HPI  Patient is in today for complaint of a cough that began three weeks ago. The patient notes that he has had 2-3 episodes per day of a dry cough without any specific triggers, which he believes is likely allergic, but given his history of a PE, which presented with a similar cough, as well as a wife with cancer, he felt that he ought to have the cough checked out. He endorses some slight shortness of breath while he takes his 2-3 mile walk in the morning, but denies SOB at baselines. He denies any recent travel, fever, chills, or illness. He endorses a runny nose with clear discharge, but denies any nasal or eye itchiness. His cough never wakes him at night. His cough is not productive. He has not tried any medication for his cough, and notes that sometimes he drinks a glass of water to help the cough go away. He notes that he has some difficulty swallowing dry foods like toast without also drinking a glass of water with them. He has a history of GERD, but is currently not on any medication and denies any specific foods that trigger his cough. He has albuterol but has not used it in a year. He occasionally takes Zyrtec to help him sleep but has not taken it regularly enough over the last several weeks to notice if it has made a difference with his cough.   Patient Care Team: [name], MD as PCP - General (Family Medicine) Roena Malady, MD (Ophthalmology) Forde Dandy, PharmD as Pharmacist (Pharmacist)   Past Medical History:  Diagnosis Date  . Acid reflux 02/03/2016  . Allergy    seasonal  . Anxiety 12/01/2016  . Chronic anticoagulation 06/14/2013  .  Coagulopathy ([place]) 05/16/2011  . Collagen vascular disease ([place])   . Cough 12/01/2016  . DVT (deep venous thrombosis) ([name])    takes coumadin  . Dysuria 02/27/2013  . 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 Hyperglycemia 12/26/2014  . Hyperlipidemia   . Hypothyroid 06/10/2011  . Insomnia 04/24/2016  . Left hip pain 12/17/2010  . MCI (mild cognitive impairment) 07/22/2016  . Nasal lesion 07/22/2016  . Polymyalgia ([place]) 03/21/2014  . 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 Preventative health care 12/17/2010  . Shingles 1982   right abdominal wall  . Staph skin infection 07/22/2016  . Thyroid nodule 11/29/2014  . Urinary hesitancy 09/30/2016  . Vitamin D deficiency 10/14/2011    Past Surgical History:  Procedure Laterality Date  . 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 Cancer Mother        unknown- everywhere  . Cancer Sister        breast    Social History   Social History  . Marital status: Married    Spouse name: N/A  . Number of children: N/A  . Years of education: N/A   Occupational History  . Not on file.   Social  History Main Topics  . Smoking status: Former Smoker    Packs/day: 1.00    Years: 20.00    Types: Cigarettes    Quit date: 03/03/1980  . Smokeless tobacco: Never Used  . Alcohol use No  . Drug use: No  . Sexual activity: Not on file   Other Topics Concern  . Not on file   Social History Narrative  . No narrative on file    Outpatient Medications Prior to Visit  Medication Sig Dispense Refill  . albuterol (PROVENTIL HFA;VENTOLIN HFA) 108 (90 Base) MCG/ACT inhaler Inhale 2 puffs into the lungs every 4 (four) hours as needed for wheezing or shortness of breath. 1 Inhaler 3  . ARMOUR THYROID 60 MG tablet TAKE 1 TABLET BY MOUTH AS DIRECTED ALTERNATING WITH THE 90 MG 45 tablet 2  . ARMOUR THYROID 90 MG tablet TAKE 1 TABLET BY MOUTH AS DIRECTED ALTERNATING WITH THE 60  MG 45 tablet 2  . b complex vitamins tablet Take 1 tablet by mouth daily.      . Cholecalciferol 4000 UNITS TABS Take 1 tablet by mouth daily.    . Coenzyme Q10 (COQ10 PO) Take 2 tablets by mouth daily.    Javier Docker Oil 300 MG CAPS 1 cap daily    . NIACIN PO Take 1 tablet by mouth 3 (three) times daily.     . rosuvastatin (CRESTOR) 40 MG tablet Take 1 tablet (40 mg total) by mouth daily. 90 tablet 1  . tacrolimus (PROTOPIC) 0.1 % ointment Apply topically 2 (two) times daily. 100 g 1  . warfarin (COUMADIN) 4 MG tablet Take 1 tablet (4 mg total) by mouth daily. 90 tablet 3  . famotidine (PEPCID) 40 MG tablet Take 40 mg by mouth daily.    . mupirocin ointment (BACTROBAN) 2 % Place 1 application into the nose 2 (two) times daily. 22 g 0  . thyroid (ARMOUR THYROID) 60 MG tablet Take 1 tablet by mouth as directed alternating with the 90 mg 45 tablet 2  . thyroid (ARMOUR THYROID) 90 MG tablet Take 1 tablet by mouth as directed alternating with the 60 mg 50 tablet 2   No facility-administered medications prior to visit.     No Known Allergies  Review of Systems  Constitutional: Positive for malaise/fatigue. Negative for fever.  HENT: Positive for congestion.   Eyes: Negative for blurred vision.  Respiratory: Positive for cough. Negative for shortness of breath.   Cardiovascular: Negative for chest pain, palpitations and leg swelling.  Gastrointestinal: Negative for abdominal pain, blood in stool and nausea.  Genitourinary: Negative for dysuria and frequency.  Musculoskeletal: Negative for falls.  Skin: Negative for rash.  Neurological: Negative for dizziness, loss of consciousness and headaches.  Endo/Heme/Allergies: Negative for environmental allergies.  Psychiatric/Behavioral: Negative for depression. The patient is nervous/anxious.        Objective:    Physical Exam  Constitutional: He is oriented to person, place, and time. He appears well-developed and well-nourished. No distress.    HENT:  Head: Normocephalic and atraumatic.  Nose: Nose normal.  Eyes: Right eye exhibits no discharge. Left eye exhibits no discharge.  Neck: Normal range of motion. Neck supple.  Cardiovascular: Normal rate and regular rhythm.   No murmur heard. Pulmonary/Chest: Effort normal and breath sounds normal.  Abdominal: Soft. Bowel sounds are normal. There is no tenderness.  Musculoskeletal: He exhibits no edema.  Neurological: He is alert and oriented to person, place, and time.  Skin: Skin is warm  and dry.  Psychiatric: He has a normal mood and affect.  Nursing note and vitals reviewed.   BP 116/67   Pulse 61   Temp 97.9 F (36.6 C) (Oral)   Ht 6' (1.829 m)   Wt 159 lb (72.1 kg)   SpO2 98%   BMI 21.56 kg/m  Wt Readings from Last 3 Encounters:  12/01/16 159 lb (72.1 kg)  07/22/16 164 lb 12.8 oz (74.8 kg)  04/24/16 166 lb (75.3 kg)   BP Readings from Last 3 Encounters:  12/01/16 116/67  07/22/16 118/76  04/24/16 108/60     Immunization History  Administered Date(s) Administered  . Hepatitis A 11/09/1998  . IPV 11/09/1998  . Influenza Split 11/22/2015  . Influenza Whole 12/13/2012  . Influenza, High Dose Seasonal PF 10/31/2016  . Influenza,inj,Quad PF,6+ Mos 10/25/2013, 12/26/2014  . Influenza-Unspecified 12/02/2011  . PPD Test 03/23/2015  . Pneumococcal Conjugate-13 10/25/2013  . Pneumococcal Polysaccharide-23 06/19/2009, 12/26/2014  . Td 03/03/1990, 07/01/2001  . Tdap 12/17/2010  . Typhoid Inactivated 11/09/1998, 03/09/2001  . Zoster 08/01/2008    Health Maintenance  Topic Date Due  . FOOT EXAM  08/[birth year]/1954  . OPHTHALMOLOGY EXAM  08/24/1954  . URINE MICROALBUMIN  08/24/1954  . HEMOGLOBIN A1C  01/18/2017  . TETANUS/TDAP  12/16/2020  . COLONOSCOPY  06/20/2026  . INFLUENZA VACCINE  Addressed  . Hepatitis C Screening  Completed  . PNA vac Low Risk Adult  Completed    Lab Results  Component Value Date   WBC 6.8 07/18/2016   HGB 15.0 07/18/2016   HCT 45.3  07/18/2016   PLT 195.0 07/18/2016   GLUCOSE 109 (H) 07/18/2016   CHOL 237 (H) 07/18/2016   TRIG 191.0 (H) 07/18/2016   HDL 40.50 07/18/2016   LDLDIRECT 158.0 03/11/2013   LDLCALC 158 (H) 07/18/2016   ALT 21 07/18/2016   AST 19 07/18/2016   NA 142 07/18/2016   K 4.6 07/18/2016   CL 107 07/18/2016   CREATININE 1.10 07/18/2016   BUN 22 07/18/2016   CO2 29 07/18/2016   TSH 1.38 07/18/2016   PSA 1.53 07/18/2016   INR 2.3 11/06/2016   HGBA1C 6.3 07/18/2016    Lab Results  Component Value Date   TSH 1.38 07/18/2016   Lab Results  Component Value Date   WBC 6.8 07/18/2016   HGB 15.0 07/18/2016   HCT 45.3 07/18/2016   MCV 90.7 07/18/2016   PLT 195.0 07/18/2016   Lab Results  Component Value Date   NA 142 07/18/2016   K 4.6 07/18/2016   CHLORIDE 109 11/16/2012   CO2 29 07/18/2016   GLUCOSE 109 (H) 07/18/2016   BUN 22 07/18/2016   CREATININE 1.10 07/18/2016   BILITOT 0.4 07/18/2016   ALKPHOS 54 07/18/2016   AST 19 07/18/2016   ALT 21 07/18/2016   PROT 7.1 07/18/2016   ALBUMIN 4.1 07/18/2016   CALCIUM 9.3 07/18/2016   ANIONGAP 8 09/11/2014   GFR 69.96 07/18/2016   Lab Results  Component Value Date   CHOL 237 (H) 07/18/2016   Lab Results  Component Value Date   HDL 40.50 07/18/2016   Lab Results  Component Value Date   LDLCALC 158 (H) 07/18/2016   Lab Results  Component Value Date   TRIG 191.0 (H) 07/18/2016   Lab Results  Component Value Date   CHOLHDL 6 07/18/2016   Lab Results  Component Value Date   HGBA1C 6.3 07/18/2016         Assessment & Plan:  Problem List Items Addressed This Visit    Hyperlipidemia    Encouraged heart healthy diet, increase exercise, avoid trans fats, consider a krill oil cap daily      Relevant Orders   Lipid panel   TSH   Vitamin D deficiency    Check Vitamin D leel today      Relevant Orders   VITAMIN D 25 Hydroxy (Vit-D Deficiency, Fractures)   Hyperglycemia    hgba1c acceptable, minimize simple  carbs. Increase exercise as tolerated. Continue current meds      Relevant Orders   Hemoglobin A1c   Comprehensive metabolic panel   TSH   Urinary hesitancy    [facility] by [facility], Dr Gloriann Loan and noted to have BPH, medications were offered but patient declines for now. Will let us know if he would like to try Flomax      Cough    Unclear etiology, consider infectious, hematologic or reflux. Does not awaken him, no obvious pattern. Nonproductive. No fevers. Had a similar cough a year ago. Has  A slight runny nose. No itchy. No eye symptoms. Given a copy for cefdinir and Singulair to use if symptoms worsen      Relevant Orders   CBC with Differential/Platelet   TSH   Anxiety    Is having increased anxiety as his and his wife's health declines. Is leading to some trouble with sleeping. He will let us know if he would like to start medications.        Other Visit Diagnoses    Dysphagia, unspecified type    -  Primary   Relevant Orders   Ambulatory referral to Gastroenterology      I have discontinued Mr. Clason famotidine, mupirocin ointment, thyroid, and thyroid. I am also having him start on famotidine, cetirizine, montelukast, and cefdinir. Additionally, I am having him maintain his b complex vitamins, Krill Oil, tacrolimus, NIACIN PO, Cholecalciferol, Coenzyme Q10 (COQ10 PO), albuterol, rosuvastatin, warfarin, ARMOUR THYROID, and ARMOUR THYROID.  Meds ordered this encounter  Medications  . famotidine (PEPCID) 40 MG tablet    Sig: Take 1 tablet (40 mg total) by mouth daily as needed for heartburn or indigestion.    Dispense:  30 tablet    Refill:  5  . cetirizine (ZYRTEC) 10 MG tablet    Sig: Take 1 tablet (10 mg total) by mouth daily as needed for allergies.    Dispense:  30 tablet    Refill:  5  . montelukast (SINGULAIR) 10 MG tablet    Sig: Take 1 tablet (10 mg total) by mouth at bedtime.    Dispense:  30 tablet    Refill:  3  . cefdinir (OMNICEF) 300 MG capsule      Sig: Take 1 capsule (300 mg total) by mouth 2 (two) times daily.    Dispense:  20 capsule    Refill:  0     Penni Homans, MD

## 2016-12-01 NOTE — Assessment & Plan Note (Addendum)
Unclear etiology, consider infectious, hematologic or reflux. Does not awaken him, no obvious pattern. Nonproductive. No fevers. Had a similar cough a year ago. Has  A slight runny nose. No itchy. No eye symptoms. Given a copy for cefdinir and Singulair to use if symptoms worsen

## 2016-12-01 NOTE — Patient Instructions (Signed)

## 2016-12-01 NOTE — Assessment & Plan Note (Signed)
Encouraged heart healthy diet, increase exercise, avoid trans fats, consider a krill oil cap daily 

## 2016-12-01 NOTE — Progress Notes (Addendum)
Subjective:  Medical student functioned as scribe  Patient ID: Paul Ryan, male    DOB: [birth year], 71 y.o.   MRN: [id number]  Chief Complaint  Patient presents with  . Cough    Pt states he has a history of clotting and is concerned so he wanted to come in and see Dr. Charlett Blake. Pt states he's had a cough for about 3 weeks and it could be linked to allergies.     HPI  Patient is in today for complaint of a cough that began three weeks ago. The patient notes that he has had 2-3 episodes per day of a dry cough without any specific triggers, which he believes is likely allergic, but given his history of a PE, which presented with a similar cough, as well as a wife with cancer, he felt that he ought to have the cough checked out. He endorses some slight shortness of breath while he takes his 2-3 mile walk in the morning, but denies SOB at baselines. He denies any recent travel, fever, chills, or illness. He endorses a runny nose with clear discharge, but denies any nasal or eye itchiness. His cough never wakes him at night. His cough is not productive. He has not tried any medication for his cough, and notes that sometimes he drinks a glass of water to help the cough go away. He notes that he has some difficulty swallowing dry foods like toast without also drinking a glass of water with them. He has a history of GERD, but is currently not on any medication and denies any specific foods that trigger his cough. He has albuterol but has not used it in a year. He occasionally takes Zyrtec to help him sleep but has not taken it regularly enough over the last several weeks to notice if it has made a difference with his cough.   Patient Care Team: [name], MD as PCP - General (Family Medicine) Roena Malady, MD (Ophthalmology) Forde Dandy, PharmD as Pharmacist (Pharmacist)   Past Medical History:  Diagnosis Date  . Acid reflux 02/03/2016  . Allergy    seasonal  . Anxiety 12/01/2016  .  Chronic anticoagulation 06/14/2013  . Coagulopathy ([place]) 05/16/2011  . Collagen vascular disease ([place])   . Cough 12/01/2016  . DVT (deep venous thrombosis) ([name])    takes coumadin  . Dysuria 02/27/2013  . 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 Hyperglycemia 12/26/2014  . Hyperlipidemia   . Hypothyroid 06/10/2011  . Insomnia 04/24/2016  . Left hip pain 12/17/2010  . MCI (mild cognitive impairment) 07/22/2016  . Nasal lesion 07/22/2016  . Polymyalgia ([place]) 03/21/2014  . 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 Preventative health care 12/17/2010  . Shingles 1982   right abdominal wall  . Staph skin infection 07/22/2016  . Thyroid nodule 11/29/2014  . Urinary hesitancy 09/30/2016  . Vitamin D deficiency 10/14/2011    Past Surgical History:  Procedure Laterality Date  . 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 Cancer Mother        unknown- everywhere  . Cancer Sister        breast    Social History   Social History  . Marital status: Married    Spouse name: N/A  . Number of children: N/A  . Years of education: N/A   Occupational History  . Not on  file.   Social History Main Topics  . Smoking status: Former Smoker    Packs/day: 1.00    Years: 20.00    Types: Cigarettes    Quit date: 03/03/1980  . Smokeless tobacco: Never Used  . Alcohol use No  . Drug use: No  . Sexual activity: Not on file   Other Topics Concern  . Not on file   Social History Narrative  . No narrative on file    Outpatient Medications Prior to Visit  Medication Sig Dispense Refill  . albuterol (PROVENTIL HFA;VENTOLIN HFA) 108 (90 Base) MCG/ACT inhaler Inhale 2 puffs into the lungs every 4 (four) hours as needed for wheezing or shortness of breath. 1 Inhaler 3  . ARMOUR THYROID 60 MG tablet TAKE 1 TABLET BY MOUTH AS DIRECTED ALTERNATING WITH THE 90 MG 45 tablet 2  . ARMOUR THYROID 90 MG tablet TAKE 1 TABLET BY  MOUTH AS DIRECTED ALTERNATING WITH THE 60 MG 45 tablet 2  . b complex vitamins tablet Take 1 tablet by mouth daily.      . Cholecalciferol 4000 UNITS TABS Take 1 tablet by mouth daily.    . Coenzyme Q10 (COQ10 PO) Take 2 tablets by mouth daily.    Javier Docker Oil 300 MG CAPS 1 cap daily    . NIACIN PO Take 1 tablet by mouth 3 (three) times daily.     . rosuvastatin (CRESTOR) 40 MG tablet Take 1 tablet (40 mg total) by mouth daily. 90 tablet 1  . tacrolimus (PROTOPIC) 0.1 % ointment Apply topically 2 (two) times daily. 100 g 1  . warfarin (COUMADIN) 4 MG tablet Take 1 tablet (4 mg total) by mouth daily. 90 tablet 3  . famotidine (PEPCID) 40 MG tablet Take 40 mg by mouth daily.    . mupirocin ointment (BACTROBAN) 2 % Place 1 application into the nose 2 (two) times daily. 22 g 0  . thyroid (ARMOUR THYROID) 60 MG tablet Take 1 tablet by mouth as directed alternating with the 90 mg 45 tablet 2  . thyroid (ARMOUR THYROID) 90 MG tablet Take 1 tablet by mouth as directed alternating with the 60 mg 50 tablet 2   No facility-administered medications prior to visit.     No Known Allergies  Review of Systems  Constitutional: Positive for malaise/fatigue. Negative for chills and fever.  HENT: Positive for congestion.   Eyes: Negative for discharge.  Respiratory: Positive for cough. Negative for sputum production and wheezing.   Cardiovascular: Negative for chest pain, orthopnea and leg swelling.  Gastrointestinal: Negative for abdominal pain, heartburn, nausea and vomiting.  Skin: Negative for itching and rash.  Neurological: Negative for dizziness, weakness and headaches.  Psychiatric/Behavioral: The patient is nervous/anxious.        Objective:    Physical Exam  Constitutional: He appears well-developed and well-nourished. No distress.  HENT:  Head: Normocephalic and atraumatic.  Right Ear: External ear normal.  Left Ear: External ear normal.  Eyes: Pupils are equal, round, and reactive to  light.  Neck: Normal range of motion. Neck supple.  Cardiovascular: Normal rate, regular rhythm, normal heart sounds and intact distal pulses.   No murmur heard. Pulmonary/Chest: Effort normal and breath sounds normal. No respiratory distress. He has no wheezes. He has no rales. He exhibits no tenderness.  Abdominal: Soft. Bowel sounds are normal.  Skin: Skin is warm and dry. He is not diaphoretic.    BP 116/67   Pulse 61   Temp 97.9 F (  36.6 C) (Oral)   Ht 6' (1.829 m)   Wt 159 lb (72.1 kg)   SpO2 98%   BMI 21.56 kg/m  Wt Readings from Last 3 Encounters:  12/01/16 159 lb (72.1 kg)  07/22/16 164 lb 12.8 oz (74.8 kg)  04/24/16 166 lb (75.3 kg)   BP Readings from Last 3 Encounters:  12/01/16 116/67  07/22/16 118/76  04/24/16 108/60     Immunization History  Administered Date(s) Administered  . Hepatitis A 11/09/1998  . IPV 11/09/1998  . Influenza Split 11/22/2015  . Influenza Whole 12/13/2012  . Influenza, High Dose Seasonal PF 10/31/2016  . Influenza,inj,Quad PF,6+ Mos 10/25/2013, 12/26/2014  . Influenza-Unspecified 12/02/2011  . PPD Test 03/23/2015  . Pneumococcal Conjugate-13 10/25/2013  . Pneumococcal Polysaccharide-23 06/19/2009, 12/26/2014  . Td 03/03/1990, 07/01/2001  . Tdap 12/17/2010  . Typhoid Inactivated 11/09/1998, 03/09/2001  . Zoster 08/01/2008    Health Maintenance  Topic Date Due  . FOOT EXAM  08/24/1954  . OPHTHALMOLOGY EXAM  08/24/1954  . URINE MICROALBUMIN  08/24/1954  . HEMOGLOBIN A1C  01/18/2017  . TETANUS/TDAP  12/16/2020  . COLONOSCOPY  06/20/2026  . INFLUENZA VACCINE  Addressed  . Hepatitis C Screening  Completed  . PNA vac Low Risk Adult  Completed    Lab Results  Component Value Date   WBC 6.8 07/18/2016   HGB 15.0 07/18/2016   HCT 45.3 07/18/2016   PLT 195.0 07/18/2016   GLUCOSE 109 (H) 07/18/2016   CHOL 237 (H) 07/18/2016   TRIG 191.0 (H) 07/18/2016   HDL 40.50 07/18/2016   LDLDIRECT 158.0 03/11/2013   LDLCALC 158 (H)  07/18/2016   ALT 21 07/18/2016   AST 19 07/18/2016   NA 142 07/18/2016   K 4.6 07/18/2016   CL 107 07/18/2016   CREATININE 1.10 07/18/2016   BUN 22 07/18/2016   CO2 29 07/18/2016   TSH 1.38 07/18/2016   PSA 1.53 07/18/2016   INR 2.3 11/06/2016   HGBA1C 6.3 07/18/2016    Lab Results  Component Value Date   TSH 1.38 07/18/2016   Lab Results  Component Value Date   WBC 6.8 07/18/2016   HGB 15.0 07/18/2016   HCT 45.3 07/18/2016   MCV 90.7 07/18/2016   PLT 195.0 07/18/2016   Lab Results  Component Value Date   NA 142 07/18/2016   K 4.6 07/18/2016   CHLORIDE 109 11/16/2012   CO2 29 07/18/2016   GLUCOSE 109 (H) 07/18/2016   BUN 22 07/18/2016   CREATININE 1.10 07/18/2016   BILITOT 0.4 07/18/2016   ALKPHOS 54 07/18/2016   AST 19 07/18/2016   ALT 21 07/18/2016   PROT 7.1 07/18/2016   ALBUMIN 4.1 07/18/2016   CALCIUM 9.3 07/18/2016   ANIONGAP 8 09/11/2014   GFR 69.96 07/18/2016   Lab Results  Component Value Date   CHOL 237 (H) 07/18/2016   Lab Results  Component Value Date   HDL 40.50 07/18/2016   Lab Results  Component Value Date   LDLCALC 158 (H) 07/18/2016   Lab Results  Component Value Date   TRIG 191.0 (H) 07/18/2016   Lab Results  Component Value Date   CHOLHDL 6 07/18/2016   Lab Results  Component Value Date   HGBA1C 6.3 07/18/2016         Assessment & Plan:   Problem List Items Addressed This Visit    Hyperlipidemia    Encouraged heart healthy diet, increase exercise, avoid trans fats, consider a krill oil cap daily  Relevant Orders   Lipid panel   TSH   Vitamin D deficiency    Check Vitamin D leel today      Relevant Orders   VITAMIN D 25 Hydroxy (Vit-D Deficiency, Fractures)   Hyperglycemia    hgba1c acceptable, minimize simple carbs. Increase exercise as tolerated. Continue current meds      Relevant Orders   Hemoglobin A1c   Comprehensive metabolic panel   TSH   Urinary hesitancy    Was seen by [facility],  Dr Gloriann Loan and noted to have BPH, medications were offered but patient declines for now. Will let us know if he would like to try Flomax      Cough    Unclear etiology, consider infectious, hematologic or reflux. Does not awaken him, no obvious pattern. Nonproductive. No fevers. Had a similar cough a year ago. Has  A slight runny nose. No itchy. No eye symptoms. Given a copy for cefdinir and Singulair to use if symptoms worsen      Relevant Orders   CBC with Differential/Platelet   TSH   Anxiety    Is having increased anxiety as his and his wife's health declines. Is leading to some trouble with sleeping. He will let us know if he would like to start medications.        Other Visit Diagnoses    Dysphagia, unspecified type    -  Primary   Relevant Orders   Ambulatory referral to Gastroenterology      1. Cough Cough of unclear origin or etiology. Denies recent illness, fever, chills, chest pain, or production of sputum. No chest pain or wheezing. Endorses some shortness of breath on exertion but denies SOB at baseline. Endorses occasional nasal congestion with clear mucus production. Denies awakening at night due to cough. Notes that he has some difficulty swallowing dry foods like toast without drinking water. Cough is likely allergic or due to reflux. Given history of reflux as well as dysphagia with dry foods, a referral to GI for evaluation is warranted. Patient should also take famotidine and cetirizine, which will help with a cough of allergic or reflux origin. Patient was counseled to stop his Krill oil pill as this can cause reflux in some patients with the option to restart it if he feels that he has not improved. Patient has also been given a paper prescription for montelukast to take daily if the cough worsens. Lower on the differential is a cough of infectious origin. He has no fever or constitutional signs of infection. Given his wife's cancer status, the patient has been given a paper  prescription for cefdinir to be filled and taken if his cough becomes productive or he starts to have a fever. PE ought to be considered given the patient's history of PE, but a lack of chest pain or elevated heart rate, as well as treatment with warfarin with a an INR of 2.3 from several weeks ago makes this outcome unlikely.   2. Urinary Retention and Discharge with odor Patient notes that he has difficulty initiating a stream early in the morning, requiring around 5 minutes before he is able to urinate. Saw a urologist, who noted some prostate gland enlargement, but felt that this appointment was unhelpful. The urologist recommended that he begin treatment with Flomax, but the patient did not want to start it. Counseled patient that we can start the Flomax if he feels like the retention is worsened. He notes that he has discharge with odor occasionally,  which has been relieved by 10 day course of cefdinir in the past, so prescription for respiratory symptoms can also treat odor. Denies frequency or pain on urination.  3. Preventative and Health Maintenance Patient has an appointment December 29, 2016. Blood for routine CBC, lipid panel, CMP, TSH, Vitamin D and HgA1c should be drawn the week prior to his appointment. Labs from 07/18/2016 demonstrated acceptable cholesterol, glucose, and HgA1c. Encouraged to maintain current path with healthy eating, exercise, and medication adherence.  I have discontinued Mr. Treiber famotidine, mupirocin ointment, thyroid, and thyroid. I am also having him start on famotidine, cetirizine, montelukast, and cefdinir. Additionally, I am having him maintain his b complex vitamins, Krill Oil, tacrolimus, NIACIN PO, Cholecalciferol, Coenzyme Q10 (COQ10 PO), albuterol, rosuvastatin, warfarin, ARMOUR THYROID, and ARMOUR THYROID.  Meds ordered this encounter  Medications  . famotidine (PEPCID) 40 MG tablet    Sig: Take 1 tablet (40 mg total) by mouth daily as needed for heartburn  or indigestion.    Dispense:  30 tablet    Refill:  5  . cetirizine (ZYRTEC) 10 MG tablet    Sig: Take 1 tablet (10 mg total) by mouth daily as needed for allergies.    Dispense:  30 tablet    Refill:  5  . montelukast (SINGULAIR) 10 MG tablet    Sig: Take 1 tablet (10 mg total) by mouth at bedtime.    Dispense:  30 tablet    Refill:  3  . cefdinir (OMNICEF) 300 MG capsule    Sig: Take 1 capsule (300 mg total) by mouth 2 (two) times daily.    Dispense:  20 capsule    Refill:  0    Patient seen with and examined with student.  Agree with documentation    Penni Homans, MD   [name], MD

## 2016-12-01 NOTE — Assessment & Plan Note (Signed)
Check Vitamin D leel today

## 2016-12-01 NOTE — Assessment & Plan Note (Signed)
hgba1c acceptable, minimize simple carbs. Increase exercise as tolerated. Continue current meds 

## 2016-12-02 ENCOUNTER — Encounter: Payer: Self-pay | Admitting: Family Medicine

## 2016-12-02 ENCOUNTER — Ambulatory Visit (INDEPENDENT_AMBULATORY_CARE_PROVIDER_SITE_OTHER): Payer: Medicare Other | Admitting: Pharmacist

## 2016-12-02 DIAGNOSIS — D688 Other specified coagulation defects: Secondary | ICD-10-CM

## 2016-12-02 DIAGNOSIS — Z7901 Long term (current) use of anticoagulants: Secondary | ICD-10-CM

## 2016-12-02 DIAGNOSIS — I82509 Chronic embolism and thrombosis of unspecified deep veins of unspecified lower extremity: Secondary | ICD-10-CM

## 2016-12-02 DIAGNOSIS — D6859 Other primary thrombophilia: Secondary | ICD-10-CM

## 2016-12-02 DIAGNOSIS — I2699 Other pulmonary embolism without acute cor pulmonale: Secondary | ICD-10-CM | POA: Diagnosis not present

## 2016-12-02 DIAGNOSIS — D689 Coagulation defect, unspecified: Secondary | ICD-10-CM

## 2016-12-02 LAB — POCT INR: INR: 3.1

## 2016-12-02 NOTE — Progress Notes (Signed)
Anticoagulation Management Paul Ryan is a 71 y.o. male who reports to the clinic for monitoring of warfarin treatment.    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 Supervising physician: Murriel Hopper  Anticoagulation Clinic Visit History: Patient does not report signs/symptoms of bleeding or thromboembolism  Other recent changes: none Anticoagulation Episode Summary    Current INR goal:   2.0-3.0  TTR:   72.4 % (2.3 y)  Next INR check:   12/02/2016  INR from last check:   3.1! 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  ARMOUR THYROID 60 MG tablet TAKE 1 TABLET BY MOUTH AS DIRECTED ALTERNATING WITH THE 90 MG  ARMOUR THYROID 90 MG tablet TAKE 1 TABLET BY MOUTH AS DIRECTED ALTERNATING WITH THE 60 MG  b complex vitamins tablet Take 1 tablet by mouth daily.    cefdinir (OMNICEF) 300 MG capsule Take 1 capsule (300 mg total) by mouth 2 (two) times daily.  cetirizine (ZYRTEC) 10 MG tablet Take 1 tablet (10 mg total) by mouth daily as needed for allergies.  Cholecalciferol 4000 UNITS TABS Take 1 tablet by mouth daily.  Coenzyme Q10 (COQ10 PO) Take 2 tablets by mouth daily.  famotidine (PEPCID) 40 MG tablet Take 1 tablet (40 mg total) by mouth daily as needed for heartburn or indigestion.  Krill Oil 300 MG CAPS 1 cap daily  montelukast (SINGULAIR) 10 MG tablet Take 1 tablet (10 mg total) by mouth at bedtime.  NIACIN PO Take 1  tablet by mouth 3 (three) times daily.   rosuvastatin (CRESTOR) 40 MG tablet Take 1 tablet (40 mg total) by mouth daily.  tacrolimus (PROTOPIC) 0.1 % ointment Apply topically 2 (two) times daily.  warfarin (COUMADIN) 4 MG tablet Take 1 tablet (4 mg total) by mouth daily.   Past Medical History:  Diagnosis Date  . Acid reflux 02/03/2016  . Allergy    seasonal  . Anxiety 12/01/2016  . Chronic anticoagulation 06/14/2013  . Coagulopathy ([place]) 05/16/2011  . Collagen vascular disease ([place])   . Cough 12/01/2016  . DVT (deep venous thrombosis) ([name])    takes coumadin  . Dysuria 02/27/2013  . 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 Hyperglycemia 12/26/2014  . Hyperlipidemia   . Hypothyroid 06/10/2011  . Insomnia 04/24/2016  . Left hip pain 12/17/2010  . MCI (mild cognitive impairment) 07/22/2016  . Nasal lesion 07/22/2016  . Polymyalgia ([place]) 03/21/2014  . 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 Preventative health care 12/17/2010  . Shingles 1982   right abdominal wall  . Staph skin infection 07/22/2016  . Thyroid nodule 11/29/2014  . Urinary hesitancy 09/30/2016  . Vitamin D deficiency 10/14/2011   Social History   Social History  . Marital status: Married    Spouse name: N/A  . Number of children: N/A  . Years of education: N/A   Social  History Main Topics  . Smoking status: Former Smoker    Packs/day: 1.00    Years: 20.00    Types: Cigarettes    Quit date: 03/03/1980  . Smokeless tobacco: Never Used  . Alcohol use No  . Drug use: No  . Sexual activity: Not on file   Other Topics Concern  . Not on file   Social History Narrative  . No narrative on file   Family History  Problem Relation Age of Onset  . Cancer Mother        unknown- everywhere  . Cancer Sister        breast    ASSESSMENT Recent Results: The most recent result is correlated with 28 mg per week: Lab Results  Component Value Date   INR 3.1 12/02/2016   INR 2.3 11/06/2016   INR 2.6  10/21/2016   PROTIME 15.6 (H) 05/17/2013    Anticoagulation Dosing: INR as of 12/02/2016 and Previous Warfarin Dosing Information    INR Dt INR Goal [name] [name]   12/02/2016 3.1 2.0-3.0 28 mg 4 mg 4 mg 4 mg 4 mg 4 mg 4 mg 4 mg    Previous description   Dr. Beryle Beams patient   Anticoagulation Warfarin Dose Instructions as of 12/02/2016      Total [name]   New Dose 28 mg 4 mg 4 mg 4 mg 4 mg 4 mg 4 mg 4 mg     (4 mg x 1)  (4 mg x 1)  (4 mg x 1)  (4 mg x 1)  (4 mg x 1)  (4 mg x 1)  (4 mg x 1)                         Description   Dr. Beryle Beams patient     INR today: Slightly supratherapeutic   PLAN Weekly dose was unchanged   Patient Instructions  Patient educated about medication as defined in this encounter and verbalized understanding by repeating back instructions provided.   Patient advised to contact clinic or seek medical attention if signs/symptoms of bleeding or thromboembolism occur.  Patient verbalized understanding by repeating back information and was advised to contact me if further medication-related questions arise. Patient was also provided an information handout.  Follow-up Return in about 1 week (around 12/09/2016).  [place]  15 minutes spent face-to-face with the patient during the encounter. 100% of time spent on education.

## 2016-12-02 NOTE — Progress Notes (Signed)
Patient was seen in clinic with Lubertha Sayres, PharmD candidate. I agree with the assessment and plan of care documented.

## 2016-12-02 NOTE — Patient Instructions (Signed)
Patient educated about medication as defined in this encounter and verbalized understanding by repeating back instructions provided.   

## 2016-12-09 ENCOUNTER — Encounter: Payer: Self-pay | Admitting: Family Medicine

## 2016-12-23 ENCOUNTER — Encounter: Payer: Self-pay | Admitting: Pharmacist

## 2016-12-23 ENCOUNTER — Other Ambulatory Visit (INDEPENDENT_AMBULATORY_CARE_PROVIDER_SITE_OTHER): Payer: Medicare Other

## 2016-12-23 DIAGNOSIS — D689 Coagulation defect, unspecified: Secondary | ICD-10-CM

## 2016-12-23 DIAGNOSIS — D6859 Other primary thrombophilia: Secondary | ICD-10-CM

## 2016-12-23 DIAGNOSIS — E559 Vitamin D deficiency, unspecified: Secondary | ICD-10-CM

## 2016-12-23 DIAGNOSIS — I2699 Other pulmonary embolism without acute cor pulmonale: Secondary | ICD-10-CM

## 2016-12-23 DIAGNOSIS — R739 Hyperglycemia, unspecified: Secondary | ICD-10-CM | POA: Diagnosis not present

## 2016-12-23 DIAGNOSIS — I82509 Chronic embolism and thrombosis of unspecified deep veins of unspecified lower extremity: Secondary | ICD-10-CM

## 2016-12-23 DIAGNOSIS — E785 Hyperlipidemia, unspecified: Secondary | ICD-10-CM

## 2016-12-23 DIAGNOSIS — Z7901 Long term (current) use of anticoagulants: Secondary | ICD-10-CM

## 2016-12-23 DIAGNOSIS — R059 Cough, unspecified: Secondary | ICD-10-CM

## 2016-12-23 DIAGNOSIS — R05 Cough: Secondary | ICD-10-CM | POA: Diagnosis not present

## 2016-12-23 LAB — CBC WITH DIFFERENTIAL/PLATELET
BASOS ABS: 0 10*3/uL (ref 0.0–0.1)
Basophils Relative: 0.4 % (ref 0.0–3.0)
EOS ABS: 0.2 10*3/uL (ref 0.0–0.7)
Eosinophils Relative: 2.8 % (ref 0.0–5.0)
HEMATOCRIT: 46.6 % (ref 39.0–52.0)
HEMOGLOBIN: 15.2 g/dL (ref 13.0–17.0)
LYMPHS PCT: 31.5 % (ref 12.0–46.0)
Lymphs Abs: 1.9 10*3/uL (ref 0.7–4.0)
MCHC: 32.6 g/dL (ref 30.0–36.0)
MCV: 92.3 fl (ref 78.0–100.0)
MONO ABS: 0.5 10*3/uL (ref 0.1–1.0)
Monocytes Relative: 8.1 % (ref 3.0–12.0)
Neutro Abs: 3.4 10*3/uL (ref 1.4–7.7)
Neutrophils Relative %: 57.2 % (ref 43.0–77.0)
Platelets: 226 10*3/uL (ref 150.0–400.0)
RBC: 5.05 Mil/uL (ref 4.22–5.81)
RDW: 14.3 % (ref 11.5–15.5)
WBC: 6 10*3/uL (ref 4.0–10.5)

## 2016-12-23 LAB — COMPREHENSIVE METABOLIC PANEL
ALT: 19 U/L (ref 0–53)
AST: 19 U/L (ref 0–37)
Albumin: 3.8 g/dL (ref 3.5–5.2)
Alkaline Phosphatase: 49 U/L (ref 39–117)
BUN: 16 mg/dL (ref 6–23)
CHLORIDE: 105 meq/L (ref 96–112)
CO2: 28 meq/L (ref 19–32)
Calcium: 9.3 mg/dL (ref 8.4–10.5)
Creatinine, Ser: 1.12 mg/dL (ref 0.40–1.50)
GFR: 68.43 mL/min (ref >60.00)
GLUCOSE: 99 mg/dL (ref 70–99)
POTASSIUM: 4.6 meq/L (ref 3.5–5.1)
SODIUM: 139 meq/L (ref 135–145)
TOTAL PROTEIN: 7.1 g/dL (ref 6.0–8.3)
Total Bilirubin: 0.6 mg/dL (ref 0.2–1.2)

## 2016-12-23 LAB — LIPID PANEL
CHOL/HDL RATIO: 5
Cholesterol: 212 mg/dL — ABNORMAL HIGH (ref 0–200)
HDL: 42.3 mg/dL (ref >39.00)
LDL CALC: 148 mg/dL — AB (ref 0–99)
NONHDL: 170.06
Triglycerides: 108 mg/dL (ref 0.0–149.0)
VLDL: 21.6 mg/dL (ref 0.0–40.0)

## 2016-12-23 LAB — POCT INR: INR: 2.5

## 2016-12-23 LAB — VITAMIN D 25 HYDROXY (VIT D DEFICIENCY, FRACTURES): VITD: 71.25 ng/mL (ref 30.00–100.00)

## 2016-12-23 LAB — HEMOGLOBIN A1C: HEMOGLOBIN A1C: 6.1 % (ref 4.6–6.5)

## 2016-12-23 LAB — TSH: TSH: 0.35 u[IU]/mL (ref 0.35–4.50)

## 2016-12-23 NOTE — Patient Instructions (Signed)
Patient educated about medication as defined in this encounter and verbalized understanding by repeating back instructions provided.   

## 2016-12-23 NOTE — Progress Notes (Addendum)
Anticoagulation Management Paul Ryan is a 71 y.o. male who is on warfarin and has a home INR meter.    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 Supervising physician: Murriel Hopper  Anticoagulation Clinic Visit History: Patient does not report signs/symptoms of bleeding or thromboembolism  Other recent changes: none  Anticoagulation Episode Summary    Current INR goal:   2.0-3.0  TTR:   72.7 % (2.3 y)  Next INR check:   01/06/2017  INR from last check:   2.5 (12/23/2016)  Weekly max warfarin dose:     Target end date:     INR check location:     Preferred lab:     Send INR reminders to:      Indications   Chronic anticoagulation [Z79.01] Coagulopathy ([place]) [[name]] DVT (deep venous thrombosis) ([name]) [[name]] Pulmonary embolism ([name]) [[name]] 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  ARMOUR THYROID 60 MG tablet TAKE 1 TABLET BY MOUTH AS DIRECTED ALTERNATING WITH THE 90 MG  ARMOUR THYROID 90 MG tablet TAKE 1 TABLET BY MOUTH AS DIRECTED ALTERNATING WITH THE 60 MG  b complex vitamins tablet Take 1 tablet by mouth daily.    cetirizine (ZYRTEC) 10 MG tablet Take 1 tablet (10 mg total) by mouth daily as needed for allergies.  Cholecalciferol 4000 UNITS TABS Take 1 tablet by mouth daily.  Coenzyme Q10 (COQ10 PO) Take 2 tablets by mouth daily.  famotidine (PEPCID) 40 MG tablet Take 1 tablet (40 mg total) by mouth daily as needed for heartburn or indigestion.  Krill Oil 300 MG CAPS 1 cap daily  montelukast (SINGULAIR) 10 MG tablet Take 1 tablet (10 mg total) by mouth at bedtime.  NIACIN PO Take 1 tablet by mouth 3 (three) times daily.   rosuvastatin (CRESTOR) 40 MG tablet Take 1 tablet (40 mg total) by mouth  daily.  tacrolimus (PROTOPIC) 0.1 % ointment Apply topically 2 (two) times daily.  warfarin (COUMADIN) 4 MG tablet Take 1 tablet (4 mg total) by mouth daily.   Past Medical History:  Diagnosis Date  . Acid reflux 02/03/2016  . Allergy    seasonal  . Anxiety 12/01/2016  . Chronic anticoagulation 06/14/2013  . Coagulopathy ([place]) 05/16/2011  . Collagen vascular disease ([place])   . Cough 12/01/2016  . DVT (deep venous thrombosis) ([name])    takes coumadin  . Dysuria 02/27/2013  . 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 Hyperglycemia 12/26/2014  . Hyperlipidemia   . Hypothyroid 06/10/2011  . Insomnia 04/24/2016  . Left hip pain 12/17/2010  . MCI (mild cognitive impairment) 07/22/2016  . Nasal lesion 07/22/2016  . Polymyalgia ([name]) 03/21/2014  . 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 Preventative health care 12/17/2010  . Shingles 1982   right abdominal wall  . Staph skin infection 07/22/2016  . Thyroid nodule 11/29/2014  . Urinary hesitancy 09/30/2016  . Vitamin D deficiency 10/14/2011   Social History   Social History  . Marital status: Married    Spouse name: N/A  . Number of children: N/A  . Years of education: N/A   Social History Main Topics  . Smoking status: Former Smoker    Packs/day: 1.00    Years:  20.00    Types: Cigarettes    Quit date: 03/03/1980  . Smokeless tobacco: Never Used  . Alcohol use No  . Drug use: No  . Sexual activity: Not on file   Other Topics Concern  . Not on file   Social History Narrative  . No narrative on file   Family History  Problem Relation Age of Onset  . Cancer Mother        unknown- everywhere  . Cancer Sister        breast   ASSESSMENT Recent Results: Lab Results  Component Value Date   INR 2.5 12/23/2016   INR 3.1 12/02/2016   INR 2.3 11/06/2016   PROTIME 15.6 (H) 05/17/2013   Anticoagulation Dosing: INR as of 12/23/2016 and Previous Warfarin Dosing Information    INR Dt INR Goal [name] Sun Mon Tue Wed  Thu Fri Sat   12/23/2016 2.5 2.0-3.0 28 mg 4 mg 4 mg 4 mg 4 mg 4 mg 4 mg 4 mg    Previous description   Dr. Beryle Beams patient   Anticoagulation Warfarin Dose Instructions as of 12/23/2016      Total [name]   New Dose 28 mg 4 mg 4 mg 4 mg 4 mg 4 mg 4 mg 4 mg     (4 mg x 1)  (4 mg x 1)  (4 mg x 1)  (4 mg x 1)  (4 mg x 1)  (4 mg x 1)  (4 mg x 1)                         Description   Dr. Beryle Beams patient     INR today: Therapeutic  PLAN Weekly dose was unchanged  Patient Instructions  Patient educated about medication as defined in this encounter and verbalized understanding by repeating back instructions provided.   Patient advised to contact clinic or seek medical attention if signs/symptoms of bleeding or thromboembolism occur.  Patient verbalized understanding by repeating back information and was advised to contact me if further medication-related questions arise. Patient was also provided an information handout.  Follow-up Return in about 2 weeks (around 01/06/2017).  [name]

## 2016-12-25 ENCOUNTER — Encounter: Payer: Self-pay | Admitting: Family Medicine

## 2016-12-25 ENCOUNTER — Ambulatory Visit (INDEPENDENT_AMBULATORY_CARE_PROVIDER_SITE_OTHER): Payer: Medicare Other | Admitting: Family Medicine

## 2016-12-25 VITALS — BP 112/62 | HR 53 | Temp 98.0°F | Resp 18 | Wt 159.4 lb

## 2016-12-25 DIAGNOSIS — D6859 Other primary thrombophilia: Secondary | ICD-10-CM

## 2016-12-25 DIAGNOSIS — M353 Polymyalgia rheumatica: Secondary | ICD-10-CM

## 2016-12-25 DIAGNOSIS — E039 Hypothyroidism, unspecified: Secondary | ICD-10-CM | POA: Diagnosis not present

## 2016-12-25 DIAGNOSIS — R350 Frequency of micturition: Secondary | ICD-10-CM | POA: Diagnosis not present

## 2016-12-25 DIAGNOSIS — N529 Male erectile dysfunction, unspecified: Secondary | ICD-10-CM | POA: Diagnosis not present

## 2016-12-25 DIAGNOSIS — D688 Other specified coagulation defects: Secondary | ICD-10-CM

## 2016-12-25 DIAGNOSIS — E785 Hyperlipidemia, unspecified: Secondary | ICD-10-CM | POA: Diagnosis not present

## 2016-12-25 DIAGNOSIS — R739 Hyperglycemia, unspecified: Secondary | ICD-10-CM

## 2016-12-25 DIAGNOSIS — R3911 Hesitancy of micturition: Secondary | ICD-10-CM

## 2016-12-25 MED ORDER — SULFAMETHOXAZOLE-TRIMETHOPRIM 800-160 MG PO TABS: 1.0000 | ORAL_TABLET | Freq: Two times a day (BID) | ORAL | 0 refills | Status: DC

## 2016-12-25 MED ORDER — SILDENAFIL CITRATE 20 MG PO TABS: 20.0000 mg | ORAL_TABLET | Freq: Every day | ORAL | 2 refills | Status: DC | PRN

## 2016-12-25 NOTE — Patient Instructions (Signed)
Shingrix is the new shingles shot, 2 shots over 2-6 months. Can get pharmacy Carbohydrate Counting for Diabetes Mellitus, Adult Carbohydrate counting is a method for keeping track of how many carbohydrates you eat. Eating carbohydrates naturally increases the amount of sugar (glucose) in the blood. Counting how many carbohydrates you eat helps keep your blood glucose within normal limits, which helps you manage your diabetes (diabetes mellitus). It is important to know how many carbohydrates you can safely have in each meal. This is different for every person. A diet and nutrition specialist (registered dietitian) can help you make a meal plan and calculate how many carbohydrates you should have at each meal and snack. Carbohydrates are found in the following foods:  Grains, such as breads and cereals.  Dried beans and soy products.  Starchy vegetables, such as potatoes, peas, and corn.  Fruit and fruit juices.  Milk and yogurt.  Sweets and snack foods, such as cake, cookies, candy, chips, and soft drinks.  How do I count carbohydrates? There are two ways to count carbohydrates in food. You can use either of the methods or a combination of both. Reading "Nutrition Facts" on packaged food The "Nutrition Facts" list is included on the labels of almost all packaged foods and beverages in the [place] It includes:  The serving size.  Information about nutrients in each serving, including the grams (g) of carbohydrate per serving.  To use the "Nutrition Facts":  Decide how many servings you will have.  Multiply the number of servings by the number of carbohydrates per serving.  The resulting number is the total amount of carbohydrates that you will be having.  Learning standard serving sizes of other foods When you eat foods containing carbohydrates that are not packaged or do not include "Nutrition Facts" on the label, you need to measure the servings in order to count the amount of  carbohydrates:  Measure the foods that you will eat with a food scale or measuring cup, if needed.  Decide how many standard-size servings you will eat.  Multiply the number of servings by 15. Most carbohydrate-rich foods have about 15 g of carbohydrates per serving. ? For example, if you eat 8 oz (170 g) of strawberries, you will have eaten 2 servings and 30 g of carbohydrates (2 servings x 15 g = 30 g).  For foods that have more than one food mixed, such as soups and casseroles, you must count the carbohydrates in each food that is included.  The following list contains standard serving sizes of common carbohydrate-rich foods. Each of these servings has about 15 g of carbohydrates:   hamburger bun or  English muffin.   oz (15 mL) syrup.   oz (14 g) jelly.  1 slice of bread.  1 six-inch tortilla.  3 oz (85 g) cooked rice or pasta.  4 oz (113 g) cooked dried beans.  4 oz (113 g) starchy vegetable, such as peas, corn, or potatoes.  4 oz (113 g) hot cereal.  4 oz (113 g) mashed potatoes or  of a large baked potato.  4 oz (113 g) canned or frozen fruit.  4 oz (120 mL) fruit juice.  4-6 crackers.  6 chicken nuggets.  6 oz (170 g) unsweetened dry cereal.  6 oz (170 g) plain fat-free yogurt or yogurt sweetened with artificial sweeteners.  8 oz (240 mL) milk.  8 oz (170 g) fresh fruit or one small piece of fruit.  24 oz (680 g) popped popcorn.  Example of carbohydrate counting Sample meal  3 oz (85 g) chicken breast.  6 oz (170 g) brown rice.  4 oz (113 g) corn.  8 oz (240 mL) milk.  8 oz (170 g) strawberries with sugar-free whipped topping. Carbohydrate calculation 1. Identify the foods that contain carbohydrates: ? Rice. ? Corn. ? Milk. ? Strawberries. 2. Calculate how many servings you have of each food: ? 2 servings rice. ? 1 serving corn. ? 1 serving milk. ? 1 serving strawberries. 3. Multiply each number of servings by 15 g: ? 2 servings  rice x 15 g = 30 g. ? 1 serving corn x 15 g = 15 g. ? 1 serving milk x 15 g = 15 g. ? 1 serving strawberries x 15 g = 15 g. 4. Add together all of the amounts to find the total grams of carbohydrates eaten: ? 30 g + 15 g + 15 g + 15 g = 75 g of carbohydrates total. This information is not intended to replace advice given to you by your health care provider. Make sure you discuss any questions you have with your health care provider. Document Released: 02/17/2005 Document Revised: 09/07/2015 Document Reviewed: 08/01/2015 Elsevier Interactive Patient Education  2018 Elsevier Inc.  

## 2016-12-25 NOTE — Progress Notes (Signed)
Subjective:  I acted as a Education administrator for Dr. Charlett Blake. [place], [place]  Patient ID: Paul Ryan, male    DOB: Aug 14, 1945, 71 y.o.   MRN: [id number]  No chief complaint on file.   HPI  Patient is in today for a follow up and he continues to struggle with the stress of managing his wife's cancer. She has had her surgery put off til later in November. Notes this is taking a toll. He is concerned about his ED that has worsened over this past year. He also notes some urinary frequency, dysuria. No fevers or chills. Denies CP/palp/SOB/HA/congestion/fevers/GI c/o. Taking meds as prescribed  Patient Care Team: [name], MD as PCP - General (Family Medicine) Roena Malady, MD (Ophthalmology) Forde Dandy, PharmD as Pharmacist (Pharmacist)   Past Medical History:  Diagnosis Date  . Acid reflux 02/03/2016  . Allergy    seasonal  . Anxiety 12/01/2016  . Chronic anticoagulation 06/14/2013  . Coagulopathy ([place]) 05/16/2011  . Collagen vascular disease ([name])   . Cough 12/01/2016  . DVT (deep venous thrombosis) ([name])    takes coumadin  . Dysuria 02/27/2013  . Erectile dysfunction 12/28/2016  . 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 Hyperglycemia 12/26/2014  . Hyperlipidemia   . Hypothyroid 06/10/2011  . Insomnia 04/24/2016  . Left hip pain 12/17/2010  . MCI (mild cognitive impairment) 07/22/2016  . Nasal lesion 07/22/2016  . Polymyalgia ([place]) 03/21/2014  . 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 Preventative health care 12/17/2010  . Shingles 1982   right abdominal wall  . Staph skin infection 07/22/2016  . Thyroid nodule 11/29/2014  . Urinary hesitancy 09/30/2016  . Vitamin D deficiency 10/14/2011    Past Surgical History:  Procedure Laterality Date  . 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 Cancer Mother        unknown- everywhere  . Cancer Sister        breast    Social History   Social  History  . Marital status: Married    Spouse name: N/A  . Number of children: N/A  . Years of education: N/A   Occupational History  . Not on file.   Social History Main Topics  . Smoking status: Former Smoker    Packs/day: 1.00    Years: 20.00    Types: Cigarettes    Quit date: 03/03/1980  . Smokeless tobacco: Never Used  . Alcohol use No  . Drug use: No  . Sexual activity: Not on file   Other Topics Concern  . Not on file   Social History Narrative  . No narrative on file    Outpatient Medications Prior to Visit  Medication Sig Dispense Refill  . albuterol (PROVENTIL HFA;VENTOLIN HFA) 108 (90 Base) MCG/ACT inhaler Inhale 2 puffs into the lungs every 4 (four) hours as needed for wheezing or shortness of breath. 1 Inhaler 3  . ARMOUR THYROID 60 MG tablet TAKE 1 TABLET BY MOUTH AS DIRECTED ALTERNATING WITH THE 90 MG 45 tablet 2  . ARMOUR THYROID 90 MG tablet TAKE 1 TABLET BY MOUTH AS DIRECTED ALTERNATING WITH THE 60 MG 45 tablet 2  . b complex vitamins tablet Take 1 tablet by mouth daily.      . cetirizine (ZYRTEC) 10 MG tablet Take 1 tablet (10 mg total) by mouth daily as needed for allergies. 30 tablet 5  .  Cholecalciferol 4000 UNITS TABS Take 1 tablet by mouth daily.    . Coenzyme Q10 (COQ10 PO) Take 2 tablets by mouth daily.    . famotidine (PEPCID) 40 MG tablet Take 1 tablet (40 mg total) by mouth daily as needed for heartburn or indigestion. 30 tablet 5  . Krill Oil 300 MG CAPS 1 cap daily    . montelukast (SINGULAIR) 10 MG tablet Take 1 tablet (10 mg total) by mouth at bedtime. 30 tablet 3  . NIACIN PO Take 1 tablet by mouth 3 (three) times daily.     . rosuvastatin (CRESTOR) 40 MG tablet Take 1 tablet (40 mg total) by mouth daily. 90 tablet 1  . tacrolimus (PROTOPIC) 0.1 % ointment Apply topically 2 (two) times daily. 100 g 1  . warfarin (COUMADIN) 4 MG tablet Take 1 tablet (4 mg total) by mouth daily. 90 tablet 3   No facility-administered medications prior to visit.      No Known Allergies  ROS     Objective:    Physical Exam  BP 112/62 (BP Location: Left Arm, Patient Position: Sitting, Cuff Size: Normal)   Pulse (!) 53   Temp 98 F (36.7 C) (Oral)   Resp 18   Wt 159 lb 6.4 oz (72.3 kg)   SpO2 98%   BMI 21.62 kg/m  Wt Readings from Last 3 Encounters:  12/25/16 159 lb 6.4 oz (72.3 kg)  12/01/16 159 lb (72.1 kg)  07/22/16 164 lb 12.8 oz (74.8 kg)   BP Readings from Last 3 Encounters:  12/25/16 112/62  12/01/16 116/67  07/22/16 118/76     Immunization History  Administered Date(s) Administered  . Hepatitis A 11/09/1998  . IPV 11/09/1998  . Influenza Split 11/22/2015  . Influenza Whole 12/13/2012  . Influenza, High Dose Seasonal PF 10/31/2016  . Influenza,inj,Quad PF,6+ Mos 10/25/2013, 12/26/2014  . Influenza-Unspecified 12/02/2011  . PPD Test 03/23/2015  . Pneumococcal Conjugate-13 10/25/2013  . Pneumococcal Polysaccharide-23 06/19/2009, 12/26/2014  . Td 03/03/1990, 07/01/2001  . Tdap 12/17/2010  . Typhoid Inactivated 11/09/1998, 03/09/2001  . Zoster 08/01/2008    Health Maintenance  Topic Date Due  . FOOT EXAM  08/24/1954  . OPHTHALMOLOGY EXAM  08/24/1954  . URINE MICROALBUMIN  08/24/1954  . HEMOGLOBIN A1C  06/23/2017  . TETANUS/TDAP  12/16/2020  . COLONOSCOPY  06/20/2026  . INFLUENZA VACCINE  Addressed  . Hepatitis C Screening  Completed  . PNA vac Low Risk Adult  Completed    Lab Results  Component Value Date   WBC 6.0 12/23/2016   HGB 15.2 12/23/2016   HCT 46.6 12/23/2016   PLT 226.0 12/23/2016   GLUCOSE 99 12/23/2016   CHOL 212 (H) 12/23/2016   TRIG 108.0 12/23/2016   HDL 42.30 12/23/2016   LDLDIRECT 158.0 03/11/2013   LDLCALC 148 (H) 12/23/2016   ALT 19 12/23/2016   AST 19 12/23/2016   NA 139 12/23/2016   K 4.6 12/23/2016   CL 105 12/23/2016   CREATININE 1.12 12/23/2016   BUN 16 12/23/2016   CO2 28 12/23/2016   TSH 0.35 12/23/2016   PSA 1.53 07/18/2016   INR 2.5 12/23/2016   HGBA1C 6.1  12/23/2016    Lab Results  Component Value Date   TSH 0.35 12/23/2016   Lab Results  Component Value Date   WBC 6.0 12/23/2016   HGB 15.2 12/23/2016   HCT 46.6 12/23/2016   MCV 92.3 12/23/2016   PLT 226.0 12/23/2016   Lab Results  Component Value Date  NA 139 12/23/2016   K 4.6 12/23/2016   CHLORIDE 109 11/16/2012   CO2 28 12/23/2016   GLUCOSE 99 12/23/2016   BUN 16 12/23/2016   CREATININE 1.12 12/23/2016   BILITOT 0.6 12/23/2016   ALKPHOS 49 12/23/2016   AST 19 12/23/2016   ALT 19 12/23/2016   PROT 7.1 12/23/2016   ALBUMIN 3.8 12/23/2016   CALCIUM 9.3 12/23/2016   ANIONGAP 8 09/11/2014   GFR 68.43 12/23/2016   Lab Results  Component Value Date   CHOL 212 (H) 12/23/2016   Lab Results  Component Value Date   HDL 42.30 12/23/2016   Lab Results  Component Value Date   LDLCALC 148 (H) 12/23/2016   Lab Results  Component Value Date   TRIG 108.0 12/23/2016   Lab Results  Component Value Date   CHOLHDL 5 12/23/2016   Lab Results  Component Value Date   HGBA1C 6.1 12/23/2016         Assessment & Plan:   Problem List Items Addressed This Visit    Hyperlipidemia    Tolerating statin, encouraged heart healthy diet, avoid trans fats, minimize simple carbs and saturated fats. Increase exercise as tolerated      Relevant Medications   sildenafil (REVATIO) 20 MG tablet   Hereditary protein S deficiency ([name])    With history of DVT will continue Coumadin indefinitely      Hypothyroid    Doing well on Armour Thyroid no changes      Polymyalgia rheumatica ([name])    No recent exacerbations      Hyperglycemia    With increased urinary symptoms and glucosuria. hgba1c acceptable, minimize simple carbs. Increase exercise as tolerated. Will add Metformin 500 mg daily if patient willing      Urinary frequency - Primary    Urine culture negative      Relevant Orders   Urinalysis (Completed)   Urine Culture (Completed)   Urinary hesitancy   Relevant  Orders   Urinalysis (Completed)   Urine Culture (Completed)   Erectile dysfunction    Will ry Revatio. He is aware not to use NTG when he uses.          I am having Mr. Mussell start on sulfamethoxazole-trimethoprim and sildenafil. I am also having him maintain his b complex vitamins, Krill Oil, tacrolimus, NIACIN PO, Cholecalciferol, Coenzyme Q10 (COQ10 PO), albuterol, rosuvastatin, warfarin, ARMOUR THYROID, ARMOUR THYROID, famotidine, cetirizine, and montelukast.  Meds ordered this encounter  Medications  . sulfamethoxazole-trimethoprim (BACTRIM DS,SEPTRA DS) 800-160 MG tablet    Sig: Take 1 tablet by mouth 2 (two) times daily.    Dispense:  28 tablet    Refill:  0  . sildenafil (REVATIO) 20 MG tablet    Sig: Take 1-5 tablets (20-100 mg total) by mouth daily as needed.    Dispense:  35 tablet    Refill:  2    CMA served as scribe during this visit. History, Physical and Plan performed by medical provider. Documentation and orders reviewed and attested to.  Penni Homans, MD

## 2016-12-26 ENCOUNTER — Ambulatory Visit: Payer: Medicare Other | Admitting: Family Medicine

## 2016-12-26 LAB — URINALYSIS
Bilirubin Urine: NEGATIVE
Hgb urine dipstick: NEGATIVE
Leukocytes, UA: NEGATIVE
Nitrite: NEGATIVE
Specific Gravity, Urine: 1.015 (ref 1.000–1.030)
Total Protein, Urine: NEGATIVE
Urine Glucose: 250 — AB
Urobilinogen, UA: 0.2 (ref 0.0–1.0)
pH: 7 (ref 5.0–8.0)

## 2016-12-27 ENCOUNTER — Encounter: Payer: Self-pay | Admitting: Family Medicine

## 2016-12-27 LAB — URINE CULTURE
MICRO NUMBER:: 81202687
RESULT: NO GROWTH
SPECIMEN QUALITY:: ADEQUATE

## 2016-12-28 ENCOUNTER — Encounter: Payer: Self-pay | Admitting: Family Medicine

## 2016-12-28 DIAGNOSIS — N529 Male erectile dysfunction, unspecified: Secondary | ICD-10-CM

## 2016-12-28 HISTORY — DX: Male erectile dysfunction, unspecified: N52.9

## 2016-12-28 NOTE — Assessment & Plan Note (Signed)
Tolerating statin, encouraged heart healthy diet, avoid trans fats, minimize simple carbs and saturated fats. Increase exercise as tolerated 

## 2016-12-28 NOTE — Assessment & Plan Note (Signed)
With increased urinary symptoms and glucosuria. hgba1c acceptable, minimize simple carbs. Increase exercise as tolerated. Will add Metformin 500 mg daily if patient willing

## 2016-12-28 NOTE — Assessment & Plan Note (Signed)
Urine culture negative.

## 2016-12-28 NOTE — Assessment & Plan Note (Signed)
With history of DVT will continue Coumadin indefinitely

## 2016-12-28 NOTE — Assessment & Plan Note (Signed)
No recent exacerbations.  

## 2016-12-28 NOTE — Assessment & Plan Note (Signed)
Doing well on Armour Thyroid no changes

## 2016-12-28 NOTE — Assessment & Plan Note (Signed)
Will ry Revatio. He is aware not to use NTG when he uses.

## 2016-12-29 ENCOUNTER — Ambulatory Visit: Payer: Medicare Other | Admitting: Family Medicine

## 2016-12-29 MED ORDER — METFORMIN HCL 500 MG PO TABS: 500.0000 mg | ORAL_TABLET | Freq: Two times a day (BID) | ORAL | 3 refills | Status: DC

## 2016-12-29 NOTE — Addendum Note (Signed)
Addended by: Magdalene Molly A on: 12/29/2016 03:59 PM   Modules accepted: Orders

## 2016-12-30 ENCOUNTER — Ambulatory Visit (INDEPENDENT_AMBULATORY_CARE_PROVIDER_SITE_OTHER): Payer: Medicare Other | Admitting: Pharmacist

## 2016-12-30 DIAGNOSIS — D689 Coagulation defect, unspecified: Secondary | ICD-10-CM

## 2016-12-30 DIAGNOSIS — Z7901 Long term (current) use of anticoagulants: Secondary | ICD-10-CM

## 2016-12-30 DIAGNOSIS — I82509 Chronic embolism and thrombosis of unspecified deep veins of unspecified lower extremity: Secondary | ICD-10-CM

## 2016-12-30 DIAGNOSIS — I2699 Other pulmonary embolism without acute cor pulmonale: Secondary | ICD-10-CM | POA: Diagnosis not present

## 2016-12-30 DIAGNOSIS — D6859 Other primary thrombophilia: Secondary | ICD-10-CM | POA: Diagnosis not present

## 2016-12-30 LAB — POCT INR: INR: 4.1

## 2016-12-30 NOTE — Progress Notes (Signed)
Anticoagulation Management Paul Ryan a 71 y.o.malewho reports to clinic for warfarin monitoring.   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 Supervising physician: Murriel Hopper  Anticoagulation Clinic Visit History: Patient does notreport signs/symptoms of bleeding or thromboembolism. Patient started a 14-day course of sulfamethoxazole-trimethoprim 2 days ago.  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  ARMOUR THYROID 60 MG tablet TAKE 1 TABLET BY MOUTH AS DIRECTED ALTERNATING WITH THE 90 MG  ARMOUR THYROID 90 MG tablet TAKE 1 TABLET BY MOUTH AS DIRECTED ALTERNATING WITH THE 60 MG  b complex vitamins tablet Take 1 tablet by mouth daily.    cetirizine (ZYRTEC) 10 MG tablet Take 1 tablet (10 mg total) by mouth daily as needed for allergies.  Cholecalciferol 4000 UNITS TABS Take 1 tablet by mouth daily.  Coenzyme Q10 (COQ10 PO) Take 2 tablets by mouth daily.  famotidine (PEPCID) 40 MG tablet Take 1 tablet (40 mg total) by mouth daily as needed for heartburn or indigestion.  Krill Oil 300 MG CAPS 1 cap daily  metFORMIN (GLUCOPHAGE) 500 MG tablet Take 1 tablet (500 mg total) by mouth 2 (two) times daily with a meal.  montelukast (SINGULAIR) 10 MG tablet Take 1 tablet (10 mg total) by mouth  at bedtime.  NIACIN PO Take 1 tablet by mouth 3 (three) times daily.   rosuvastatin (CRESTOR) 40 MG tablet Take 1 tablet (40 mg total) by mouth daily.  sildenafil (REVATIO) 20 MG tablet Take 1-5 tablets (20-100 mg total) by mouth daily as needed.  sulfamethoxazole-trimethoprim (BACTRIM DS,SEPTRA DS) 800-160 MG tablet Take 1 tablet by mouth 2 (two) times daily.  tacrolimus (PROTOPIC) 0.1 % ointment Apply topically 2 (two) times daily.  warfarin (COUMADIN) 4 MG tablet Take 1 tablet (4 mg total) by mouth daily.   Past Medical History:  Diagnosis Date  . Acid reflux 02/03/2016  . Allergy    seasonal  . Anxiety 12/01/2016  . Chronic anticoagulation 06/14/2013  . Coagulopathy ([place]) 05/16/2011  . Collagen vascular disease ([place])   . Cough 12/01/2016  . DVT (deep venous thrombosis) ([name])    takes coumadin  . Dysuria 02/27/2013  . Erectile dysfunction 12/28/2016  . 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 Hyperglycemia 12/26/2014  . Hyperlipidemia   . Hypothyroid 06/10/2011  . Insomnia 04/24/2016  . Left hip pain 12/17/2010  . MCI (mild cognitive impairment) 07/22/2016  . Nasal lesion 07/22/2016  . Polymyalgia ([place]) 03/21/2014  . 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 Preventative health care 12/17/2010  . Shingles 1982   right abdominal wall  . Staph skin infection 07/22/2016  . Thyroid nodule 11/29/2014  . Urinary hesitancy 09/30/2016  .  Vitamin D deficiency 10/14/2011   Social History   Social History  . Marital status: Married    Spouse name: N/A  . Number of children: N/A  . Years of education: N/A   Social History Main Topics  . Smoking status: Former Smoker    Packs/day: 1.00    Years: 20.00    Types: Cigarettes    Quit date: 03/03/1980  . Smokeless tobacco: Never Used  . Alcohol use No  . Drug use: No  . Sexual activity: Not on file   Other Topics Concern  . Not on file   Social History Narrative  . No narrative on file   Family History  Problem  Relation Age of Onset  . Cancer Mother        unknown- everywhere  . Cancer Sister        breast   ASSESSMENT Recent Results: Lab Results  Component Value Date   INR 4.1 12/30/2016   INR 2.5 12/23/2016   INR 3.1 12/02/2016   PROTIME 15.6 (H) 05/17/2013   Anticoagulation Dosing: INR as of 12/30/2016 and Previous Warfarin Dosing Information    INR Dt INR Goal Wkly Tot [name]     2.0-3.0 28 mg 4 mg 4 mg 4 mg 4 mg 4 mg 4 mg 4 mg    Previous description   Dr. Beryle Beams patient   Anticoagulation Warfarin Dose Instructions as of 12/30/2016      Total [name]   New Dose 22 mg 4 mg 4 mg 2 mg 2 mg 2 mg 4 mg 4 mg     (4 mg x 1)  (4 mg x 1)  (4 mg x 0.5)  (4 mg x 0.5)  (4 mg x 0.5)  (4 mg x 1)  (4 mg x 1)                         Description   Dr. Beryle Beams patient     INR today: Supratherapeutic  PLAN Weekly dose was decreased by 50%, re-check INR in 3 days.  Patient Instructions  Patient educated about medication as defined in this encounter and verbalized understanding by repeating back instructions provided.    Patient advised to contact clinic or seek medical attention if signs/symptoms of bleeding or thromboembolism occur.  Patient verbalized understanding by repeating back information and was advised to contact me if further medication-related questions arise. Patient was also provided an information handout.  Follow-up Return in about 3 days (around 01/02/2017).  [name]

## 2016-12-30 NOTE — Patient Instructions (Signed)
Patient educated about medication as defined in this encounter and verbalized understanding by repeating back instructions provided.   

## 2016-12-31 NOTE — Progress Notes (Signed)
Reviewed Thanks DrG 

## 2017-01-02 ENCOUNTER — Encounter: Payer: Self-pay | Admitting: Pharmacist

## 2017-01-02 DIAGNOSIS — I82509 Chronic embolism and thrombosis of unspecified deep veins of unspecified lower extremity: Secondary | ICD-10-CM

## 2017-01-02 DIAGNOSIS — I2699 Other pulmonary embolism without acute cor pulmonale: Secondary | ICD-10-CM

## 2017-01-02 DIAGNOSIS — D6859 Other primary thrombophilia: Secondary | ICD-10-CM

## 2017-01-02 DIAGNOSIS — D689 Coagulation defect, unspecified: Secondary | ICD-10-CM

## 2017-01-02 DIAGNOSIS — Z7901 Long term (current) use of anticoagulants: Secondary | ICD-10-CM

## 2017-01-02 LAB — POCT INR: INR: 2.5

## 2017-01-02 NOTE — Progress Notes (Signed)
Anticoagulation Management Key Cen a 71 y.o.malewho is on warfarin and has a home INR meter.   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 Supervising physician: Murriel Hopper  Anticoagulation Clinic Visit History: Patient does notreport signs/symptoms of bleeding or thromboembolism. Patient started a 14-day course of sulfamethoxazole-trimethoprim 5 days ago.  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  ARMOUR THYROID 60 MG tablet TAKE 1 TABLET BY MOUTH AS DIRECTED ALTERNATING WITH THE 90 MG  ARMOUR THYROID 90 MG tablet TAKE 1 TABLET BY MOUTH AS DIRECTED ALTERNATING WITH THE 60 MG  b complex vitamins tablet Take 1 tablet by mouth daily.    cetirizine (ZYRTEC) 10 MG tablet Take 1 tablet (10 mg total) by mouth daily as needed for allergies.  Cholecalciferol 4000 UNITS TABS Take 1 tablet by mouth daily.  Coenzyme Q10 (COQ10 PO) Take 2 tablets by mouth daily.  famotidine (PEPCID) 40 MG tablet Take 1 tablet (40 mg total) by mouth daily as needed for heartburn or indigestion.  Krill Oil 300 MG CAPS 1 cap daily  metFORMIN (GLUCOPHAGE) 500 MG tablet Take 1 tablet (500 mg total) by mouth 2 (two) times daily with a meal.  montelukast (SINGULAIR) 10 MG tablet Take 1 tablet (10 mg  total) by mouth at bedtime.  NIACIN PO Take 1 tablet by mouth 3 (three) times daily.   rosuvastatin (CRESTOR) 40 MG tablet Take 1 tablet (40 mg total) by mouth daily.  sildenafil (REVATIO) 20 MG tablet Take 1-5 tablets (20-100 mg total) by mouth daily as needed.  sulfamethoxazole-trimethoprim (BACTRIM DS,SEPTRA DS) 800-160 MG tablet Take 1 tablet by mouth 2 (two) times daily.  tacrolimus (PROTOPIC) 0.1 % ointment Apply topically 2 (two) times daily.  warfarin (COUMADIN) 4 MG tablet Take 1 tablet (4 mg total) by mouth daily.   Past Medical History:  Diagnosis Date  . Acid reflux 02/03/2016  . Allergy    seasonal  . Anxiety 12/01/2016  . Chronic anticoagulation 06/14/2013  . Coagulopathy ([place]) 05/16/2011  . Collagen vascular disease ([place])   . Cough 12/01/2016  . DVT (deep venous thrombosis) ([name])    takes coumadin  . Dysuria 02/27/2013  . Erectile dysfunction 12/28/2016  . 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 Hyperglycemia 12/26/2014  . Hyperlipidemia   . Hypothyroid 06/10/2011  . Insomnia 04/24/2016  . Left hip pain 12/17/2010  . MCI (mild cognitive impairment) 07/22/2016  . Nasal lesion 07/22/2016  . Polymyalgia ([place]) 03/21/2014  . 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 Preventative health care 12/17/2010  . Shingles 1982   right abdominal wall  . Staph skin infection 07/22/2016  . Thyroid nodule 11/29/2014  . Urinary  hesitancy 09/30/2016  . Vitamin D deficiency 10/14/2011   Social History   Social History  . Marital status: Married    Spouse name: N/A  . Number of children: N/A  . Years of education: N/A   Social History Main Topics  . Smoking status: Former Smoker    Packs/day: 1.00    Years: 20.00    Types: Cigarettes    Quit date: 03/03/1980  . Smokeless tobacco: Never Used  . Alcohol use No  . Drug use: No  . Sexual activity: Not on file   Other Topics Concern  . Not on file   Social History Narrative  . No narrative on file   Family History   Problem Relation Age of Onset  . Cancer Mother        unknown- everywhere  . Cancer Sister        breast   ASSESSMENT Recent Results: Lab Results  Component Value Date   INR 2.5 01/02/2017   INR 4.1 12/30/2016   INR 2.5 12/23/2016   PROTIME 15.6 (H) 05/17/2013   Anticoagulation Dosing: INR as of 01/02/2017 and Previous Warfarin Dosing Information    INR Dt INR Goal [name] [name]   01/02/2017 2.5 2.0-3.0 22 mg 4 mg 4 mg 2 mg 2 mg 2 mg 4 mg 4 mg    Previous description   Dr. Beryle Beams patient   Anticoagulation Warfarin Dose Instructions as of 01/02/2017      Total [name]   New Dose 18 mg 2 mg 4 mg 2 mg 2 mg 2 mg 2 mg 4 mg     (4 mg x 0.5)  (4 mg x 1)  (4 mg x 0.5)  (4 mg x 0.5)  (4 mg x 0.5)  (4 mg x 0.5)  (4 mg x 1)                         Description   Dr. Beryle Beams patient     INR today: Therapeutic  PLAN Instructed patient to take 1/2 tablet today, 1 tablet tomorrow, 1/2 tablet Sunday, follow up on Monday.  Patient Instructions  Patient educated about medication as defined in this encounter and verbalized understanding by repeating back instructions provided.   Patient advised to contact clinic or seek medical attention if signs/symptoms of bleeding or thromboembolism occur.  Patient verbalized understanding by repeating back information and was advised to contact me if further medication-related questions arise. Patient was also provided an information handout.  Follow-up Return in about 3 days (around 01/05/2017).  [name]

## 2017-01-02 NOTE — Patient Instructions (Signed)
Patient educated about medication as defined in this encounter and verbalized understanding by repeating back instructions provided.   

## 2017-01-05 ENCOUNTER — Encounter: Payer: Self-pay | Admitting: Pharmacist

## 2017-01-07 LAB — PROTIME-INR

## 2017-01-14 LAB — PROTIME-INR: INR: 2.1 — AB (ref 0.9–1.1)

## 2017-01-26 ENCOUNTER — Ambulatory Visit (INDEPENDENT_AMBULATORY_CARE_PROVIDER_SITE_OTHER): Payer: Medicare Other | Admitting: Pharmacist

## 2017-01-26 DIAGNOSIS — D689 Coagulation defect, unspecified: Secondary | ICD-10-CM

## 2017-01-26 DIAGNOSIS — I82509 Chronic embolism and thrombosis of unspecified deep veins of unspecified lower extremity: Secondary | ICD-10-CM

## 2017-01-26 DIAGNOSIS — Z7901 Long term (current) use of anticoagulants: Secondary | ICD-10-CM

## 2017-01-26 DIAGNOSIS — D6859 Other primary thrombophilia: Secondary | ICD-10-CM

## 2017-01-26 DIAGNOSIS — I2699 Other pulmonary embolism without acute cor pulmonale: Secondary | ICD-10-CM

## 2017-01-26 DIAGNOSIS — D688 Other specified coagulation defects: Secondary | ICD-10-CM

## 2017-01-26 LAB — POCT INR: INR: 2.6

## 2017-02-03 LAB — PROTIME-INR: INR: 2.6 — AB (ref 0.9–1.1)

## 2017-02-03 NOTE — Progress Notes (Signed)
Anticoagulation Management Paul Ryan a 71 y.o.malewho is on warfarin and has a home INR meter.   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 Supervising physician: Murriel Hopper  Anticoagulation Clinic Visit History: Patient does not report signs/symptoms of bleeding or thromboembolism, or any other changes  Anticoagulation Episode Summary    Current INR goal:   2.0-3.0  TTR:   73.0 % (2.4 y)  Next INR check:   02/09/2017  INR from last check:   2.6 (01/26/2017)  Weekly max warfarin dose:     Target end date:     INR check location:     Preferred lab:     Send INR reminders to:      Indications   Chronic anticoagulation [Z79.01] Coagulopathy ([place]) [[name]] DVT (deep venous thrombosis) ([name]) [[name]] Pulmonary embolism ([name]) [[name]] 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  ARMOUR THYROID 60 MG tablet TAKE 1 TABLET BY MOUTH AS DIRECTED ALTERNATING WITH THE 90 MG  ARMOUR THYROID 90 MG tablet TAKE 1 TABLET BY MOUTH AS DIRECTED ALTERNATING WITH THE 60 MG  b complex vitamins tablet Take 1 tablet by mouth daily.    cetirizine (ZYRTEC) 10 MG tablet Take 1 tablet (10 mg total) by mouth daily as needed for allergies.  Cholecalciferol 4000 UNITS TABS Take 1 tablet by mouth daily.  Coenzyme Q10 (COQ10 PO) Take 2 tablets by mouth daily.  famotidine (PEPCID) 40 MG tablet Take 1 tablet (40 mg total) by mouth daily as needed for heartburn or indigestion.  Krill Oil 300 MG CAPS 1 cap daily  metFORMIN (GLUCOPHAGE) 500 MG tablet Take 1 tablet (500 mg total) by mouth 2 (two) times daily with a meal.  montelukast (SINGULAIR) 10 MG tablet Take 1 tablet (10 mg total) by mouth at bedtime.  NIACIN PO Take 1 tablet by  mouth 3 (three) times daily.   rosuvastatin (CRESTOR) 40 MG tablet Take 1 tablet (40 mg total) by mouth daily.  sildenafil (REVATIO) 20 MG tablet Take 1-5 tablets (20-100 mg total) by mouth daily as needed.  sulfamethoxazole-trimethoprim (BACTRIM DS,SEPTRA DS) 800-160 MG tablet Take 1 tablet by mouth 2 (two) times daily.  tacrolimus (PROTOPIC) 0.1 % ointment Apply topically 2 (two) times daily.  warfarin (COUMADIN) 4 MG tablet Take 1 tablet (4 mg total) by mouth daily.   Past Medical History:  Diagnosis Date  . Acid reflux 02/03/2016  . Allergy    seasonal  . Anxiety 12/01/2016  . Chronic anticoagulation 06/14/2013  . Coagulopathy ([place]) 05/16/2011  . Collagen vascular disease ([place])   . Cough 12/01/2016  . DVT (deep venous thrombosis) ([name])    takes coumadin  . Dysuria 02/27/2013  . Erectile dysfunction 12/28/2016  . 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 Hyperglycemia 12/26/2014  . Hyperlipidemia   . Hypothyroid 06/10/2011  . Insomnia 04/24/2016  . Left hip pain 12/17/2010  . MCI (mild cognitive impairment) 07/22/2016  . Nasal lesion 07/22/2016  . Polymyalgia ([place]) 03/21/2014  . 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 Preventative health care 12/17/2010  . Shingles 1982   right abdominal wall  . Staph skin infection 07/22/2016  . Thyroid nodule 11/29/2014  . Urinary hesitancy 09/30/2016  . Vitamin  D deficiency 10/14/2011   Social History   Socioeconomic History  . Marital status: Married    Spouse name: Not on file  . Number of children: Not on file  . Years of education: Not on file  . Highest education level: Not on file  Social Needs  . Financial resource strain: Not on file  . Food insecurity - worry: Not on file  . Food insecurity - inability: Not on file  . Transportation needs - medical: Not on file  . Transportation needs - non-medical: Not on file  Occupational History  . Not on file  Tobacco Use  . Smoking status: Former Smoker    Packs/day: 1.00     Years: 20.00    Pack years: 20.00    Types: Cigarettes    Last attempt to quit: 03/03/1980    Years since quitting: 36.9  . Smokeless tobacco: Never Used  Substance and Sexual Activity  . Alcohol use: No    Alcohol/week: 0.0 oz  . Drug use: No  . Sexual activity: Not on file  Other Topics Concern  . Not on file  Social History Narrative  . Not on file   Family History  Problem Relation Age of Onset  . Cancer Mother        unknown- everywhere  . Cancer Sister        breast   ASSESSMENT Lab Results  Component Value Date   INR 2.6 01/26/2017   INR 2.1 (A) 01/09/2017   INR 2.5 01/02/2017   PROTIME 15.6 (H) 05/17/2013   Anticoagulation Dosing: Description   Dr. Beryle Beams patient     INR today: Therapeutic  PLAN Weekly dose was unchanged   Patient Instructions  Patient educated about medication as defined in this encounter and verbalized understanding by repeating back instructions provided.    Patient advised to contact clinic or seek medical attention if signs/symptoms of bleeding or thromboembolism occur.  Patient verbalized understanding by repeating back information and was advised to contact me if further medication-related questions arise. Patient was also provided an information handout.  Follow-up Return in about 2 weeks (around 02/09/2017).  [name]

## 2017-02-03 NOTE — Patient Instructions (Signed)
Patient educated about medication as defined in this encounter and verbalized understanding by repeating back instructions provided.   

## 2017-02-05 NOTE — Progress Notes (Signed)
Reviewed thx DrG 

## 2017-02-17 ENCOUNTER — Encounter: Payer: Self-pay | Admitting: Pharmacist

## 2017-02-17 DIAGNOSIS — I82509 Chronic embolism and thrombosis of unspecified deep veins of unspecified lower extremity: Secondary | ICD-10-CM

## 2017-02-17 DIAGNOSIS — D689 Coagulation defect, unspecified: Secondary | ICD-10-CM

## 2017-02-17 DIAGNOSIS — I2699 Other pulmonary embolism without acute cor pulmonale: Secondary | ICD-10-CM

## 2017-02-17 DIAGNOSIS — D6859 Other primary thrombophilia: Secondary | ICD-10-CM

## 2017-02-17 DIAGNOSIS — Z7901 Long term (current) use of anticoagulants: Secondary | ICD-10-CM

## 2017-02-17 LAB — POCT INR: INR: 4.4

## 2017-02-17 NOTE — Patient Instructions (Signed)
Patient was educated about medication as defined in this encounter and verbalized understanding by repeating back instructions provided.  

## 2017-02-17 NOTE — Progress Notes (Addendum)
Anticoagulation Management Paul Ryan is a 71 y.o. male who reports to the clinic for monitoring of warfarin treatment.    Indication: History DVT, PE, protein S deficiency Duration: indefinite Supervising physician: Murriel Hopper  Anticoagulation Clinic Visit History: Patient does not report signs/symptoms of bleeding or thromboembolism. He states he may have taken a double dose on accident due to travelling, challenges remembering his dose.  Anticoagulation Episode Summary    Current INR goal:   2.0-3.0  TTR:   71.7 % (2.5 y)  Next INR check:   02/24/2017  INR from last check:   4.4! 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  ARMOUR THYROID 60 MG tablet TAKE 1 TABLET BY MOUTH AS DIRECTED ALTERNATING WITH THE 90 MG  ARMOUR THYROID 90 MG tablet TAKE 1 TABLET BY MOUTH AS DIRECTED ALTERNATING WITH THE 60 MG  b complex vitamins tablet Take 1 tablet by mouth daily.    cetirizine (ZYRTEC) 10 MG tablet Take 1 tablet (10 mg total) by mouth daily as needed for allergies.  Cholecalciferol 4000 UNITS TABS Take 1 tablet by mouth daily.  Coenzyme Q10 (COQ10 PO) Take 2 tablets by mouth daily.  famotidine (PEPCID) 40 MG tablet Take 1 tablet (40 mg total) by mouth daily as needed for heartburn or indigestion.  Krill Oil 300 MG CAPS 1 cap daily  metFORMIN (GLUCOPHAGE) 500 MG tablet Take 1 tablet (500 mg total) by mouth 2 (two) times daily with a meal.   montelukast (SINGULAIR) 10 MG tablet Take 1 tablet (10 mg total) by mouth at bedtime.  NIACIN PO Take 1 tablet by mouth 3 (three) times daily.   rosuvastatin (CRESTOR) 40 MG tablet Take 1 tablet (40 mg total) by mouth daily.  sildenafil (REVATIO) 20 MG tablet Take 1-5 tablets (20-100 mg total) by mouth daily as needed.  sulfamethoxazole-trimethoprim (BACTRIM DS,SEPTRA DS) 800-160 MG tablet Take 1 tablet by mouth 2 (two) times daily.  tacrolimus (PROTOPIC) 0.1 % ointment Apply topically 2 (two) times daily.  warfarin (COUMADIN) 4 MG tablet Take 1 tablet (4 mg total) by mouth daily.   Past Medical History:  Diagnosis Date  . Acid reflux 02/03/2016  . Allergy    seasonal  . Anxiety 12/01/2016  . Chronic anticoagulation 06/14/2013  . Coagulopathy ([place]) 05/16/2011  . Collagen vascular disease ([place])   . Cough 12/01/2016  . DVT (deep venous thrombosis) ([name])    takes coumadin  . Dysuria 02/27/2013  . Erectile dysfunction 12/28/2016  . 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 Hyperglycemia 12/26/2014  . Hyperlipidemia   . Hypothyroid 06/10/2011  . Insomnia 04/24/2016  . Left hip pain 12/17/2010  . MCI (mild cognitive impairment) 07/22/2016  . Nasal lesion 07/22/2016  . Polymyalgia ([place]) 03/21/2014  . 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 Preventative health care 12/17/2010  . Shingles 1982   right abdominal wall  .  Staph skin infection 07/22/2016  . Thyroid nodule 11/29/2014  . Urinary hesitancy 09/30/2016  . Vitamin D deficiency 10/14/2011   Social History   Socioeconomic History  . Marital status: Married    Spouse name: Not on file  . Number of children: Not on file  . Years of education: Not on file  . Highest education level: Not on file  Social Needs  . Financial resource strain: Not on file  . Food insecurity - worry: Not on file  . Food insecurity - inability: Not on file  . Transportation needs - medical: Not on file  . Transportation needs - non-medical:  Not on file  Occupational History  . Not on file  Tobacco Use  . Smoking status: Former Smoker    Packs/day: 1.00    Years: 20.00    Pack years: 20.00    Types: Cigarettes    Last attempt to quit: 03/03/1980    Years since quitting: 36.9  . Smokeless tobacco: Never Used  Substance and Sexual Activity  . Alcohol use: No    Alcohol/week: 0.0 oz  . Drug use: No  . Sexual activity: Not on file  Other Topics Concern  . Not on file  Social History Narrative  . Not on file   Family History  Problem Relation Age of Onset  . Cancer Mother        unknown- everywhere  . Cancer Sister        breast   ASSESSMENT Recent Results: Lab Results  Component Value Date   INR 4.4 02/17/2017   INR 2.6 01/26/2017   INR 2.6 (A) 01/26/2017   PROTIME 15.6 (H) 05/17/2013    Anticoagulation Dosing: Description   Dr. Beryle Beams patient     INR today: supratherapeutic  PLAN Hold dose, resume 4 mg daily.  There are no Patient Instructions on file for this visit. Patient advised to contact clinic or seek medical attention if signs/symptoms of bleeding or thromboembolism occur Patient verbalized understanding by repeating back information and was advised to contact me if further medication-related questions arise. Patient was also provided an information handout.  Follow-up Return in about 1 week (around 02/24/2017).  [name]

## 2017-02-19 NOTE — Addendum Note (Signed)
Addended by: Emelee Rodocker J on: 02/19/2017 04:03 PM   Modules accepted: Level of Service  

## 2017-02-22 ENCOUNTER — Encounter: Payer: Self-pay | Admitting: Family Medicine

## 2017-02-24 ENCOUNTER — Other Ambulatory Visit: Payer: Self-pay | Admitting: Family Medicine

## 2017-02-24 DIAGNOSIS — R3911 Hesitancy of micturition: Secondary | ICD-10-CM

## 2017-03-02 ENCOUNTER — Encounter: Payer: Self-pay | Admitting: Family Medicine

## 2017-03-03 ENCOUNTER — Encounter: Payer: Self-pay | Admitting: Pharmacist

## 2017-03-03 DIAGNOSIS — Z7901 Long term (current) use of anticoagulants: Secondary | ICD-10-CM

## 2017-03-03 DIAGNOSIS — I82509 Chronic embolism and thrombosis of unspecified deep veins of unspecified lower extremity: Secondary | ICD-10-CM

## 2017-03-03 DIAGNOSIS — I2699 Other pulmonary embolism without acute cor pulmonale: Secondary | ICD-10-CM

## 2017-03-03 DIAGNOSIS — D689 Coagulation defect, unspecified: Secondary | ICD-10-CM

## 2017-03-03 DIAGNOSIS — D6859 Other primary thrombophilia: Secondary | ICD-10-CM

## 2017-03-03 LAB — POCT INR: INR: 2.6

## 2017-03-09 LAB — PROTIME-INR

## 2017-03-16 IMAGING — DX DG HAND COMPLETE 3+V*R*
3 series · 3 of 3 positions shown · non-contrast
Comparison: None.

CLINICAL DATA: 70-year-old male with a history of injury and
swelling.

EXAM:
RIGHT HAND - COMPLETE 3+ VIEW

[hand pa]
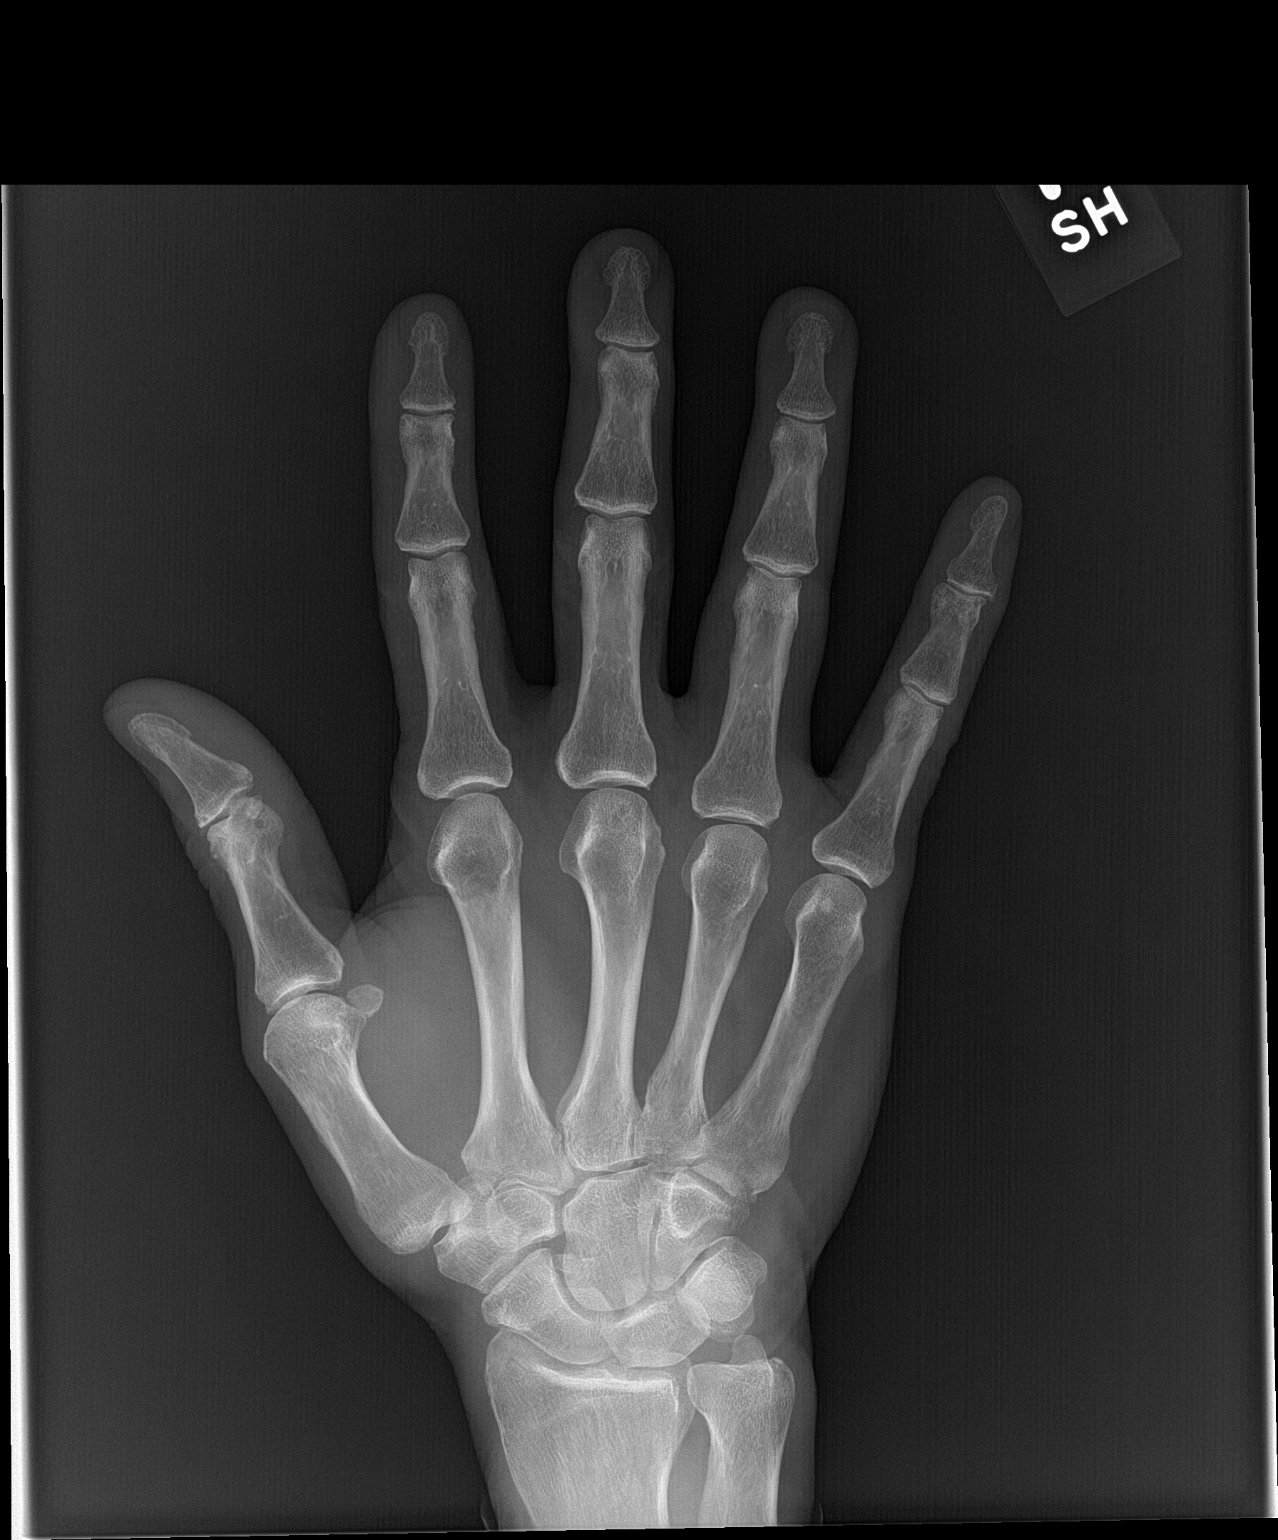

[hand obl]
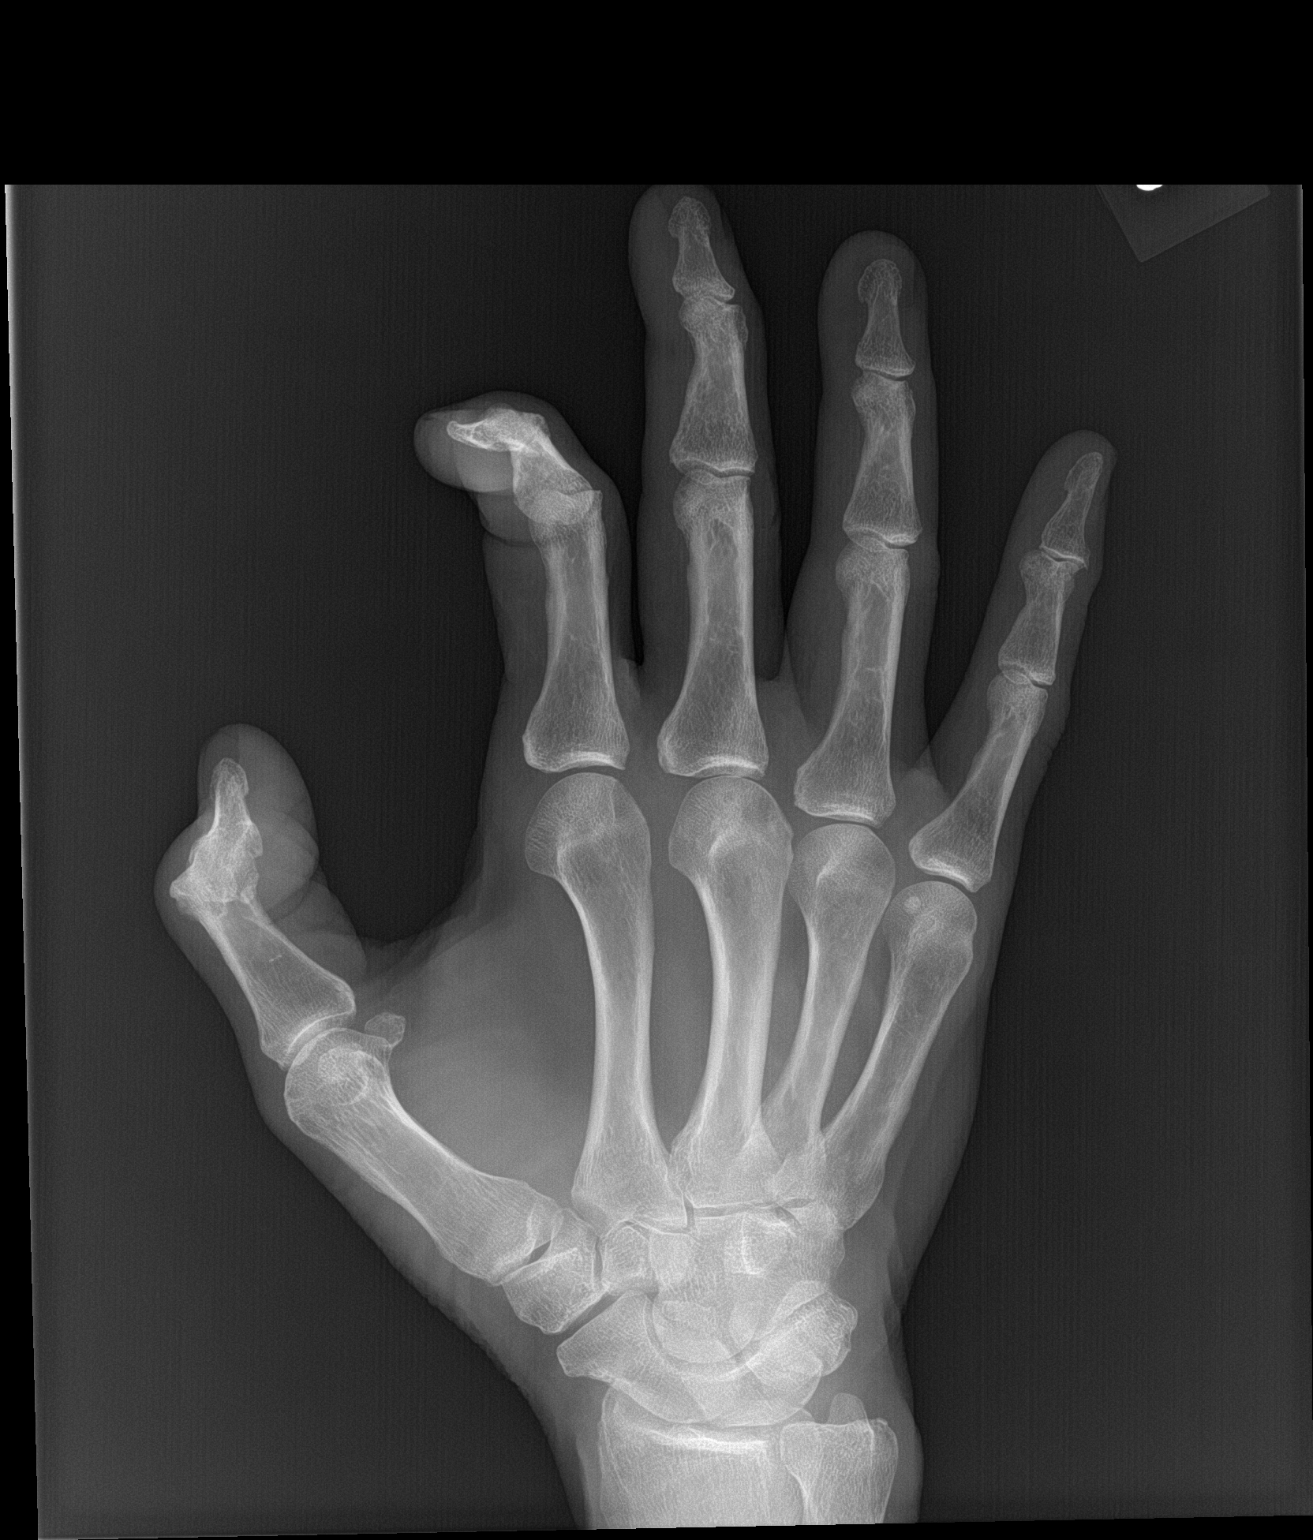

[hand lat]
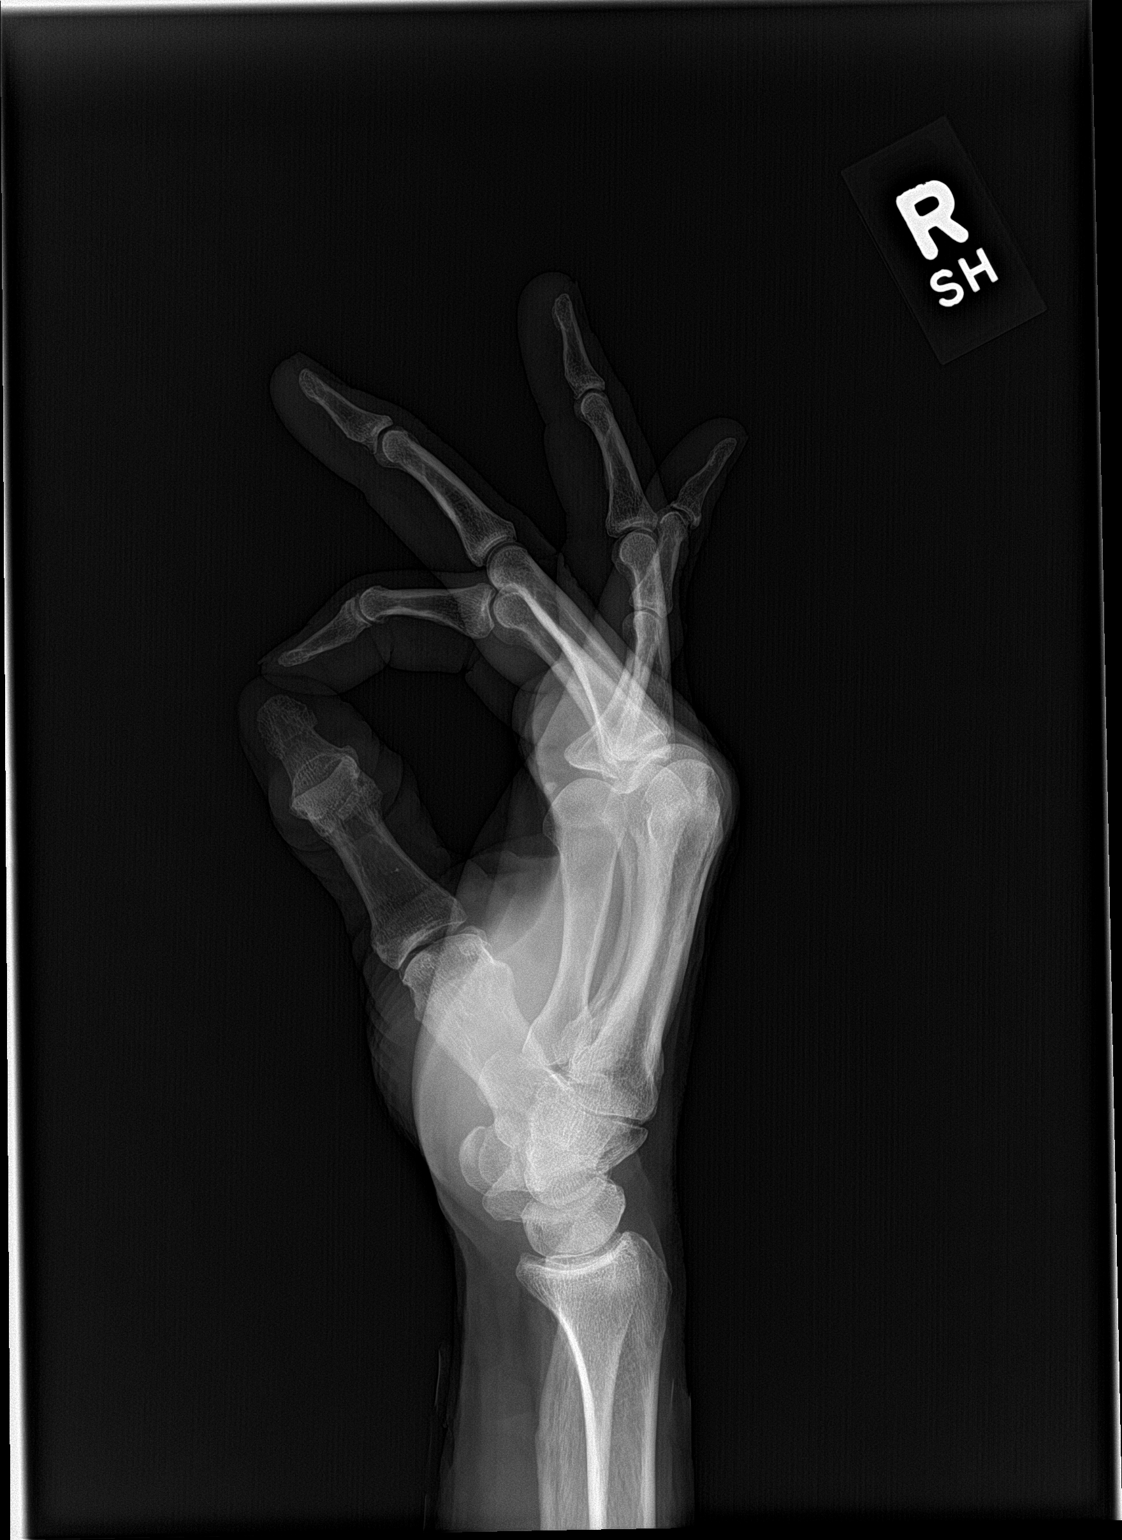

[3 of 3 positions shown; findings below may reference images not displayed]

FINDINGS: No acute fracture identified. No focal soft tissue swelling. Mild
degenerative changes of the interphalangeal joints. Joints are
congruent. No radiopaque foreign body.
IMPRESSION: Negative for acute bony abnormality.

## 2017-03-17 ENCOUNTER — Encounter: Payer: Self-pay | Admitting: Pharmacist

## 2017-03-17 DIAGNOSIS — I82509 Chronic embolism and thrombosis of unspecified deep veins of unspecified lower extremity: Secondary | ICD-10-CM

## 2017-03-17 DIAGNOSIS — Z7901 Long term (current) use of anticoagulants: Secondary | ICD-10-CM

## 2017-03-17 DIAGNOSIS — I2699 Other pulmonary embolism without acute cor pulmonale: Secondary | ICD-10-CM

## 2017-03-17 DIAGNOSIS — D6859 Other primary thrombophilia: Secondary | ICD-10-CM

## 2017-03-17 DIAGNOSIS — D689 Coagulation defect, unspecified: Secondary | ICD-10-CM

## 2017-03-17 LAB — POCT INR: INR: 2.3

## 2017-03-24 LAB — POCT INR: INR: 4.4

## 2017-03-25 ENCOUNTER — Encounter: Payer: Self-pay | Admitting: Pharmacist

## 2017-03-25 DIAGNOSIS — D689 Coagulation defect, unspecified: Secondary | ICD-10-CM

## 2017-03-25 DIAGNOSIS — I2699 Other pulmonary embolism without acute cor pulmonale: Secondary | ICD-10-CM

## 2017-03-25 DIAGNOSIS — I82509 Chronic embolism and thrombosis of unspecified deep veins of unspecified lower extremity: Secondary | ICD-10-CM

## 2017-03-25 DIAGNOSIS — Z7901 Long term (current) use of anticoagulants: Secondary | ICD-10-CM

## 2017-03-25 DIAGNOSIS — D6859 Other primary thrombophilia: Secondary | ICD-10-CM

## 2017-03-25 NOTE — Progress Notes (Addendum)
Anticoagulation Management Paul Ryan is a 72 y.o. male who reports to the clinic for monitoring of warfarin treatment.    Indication: History DVT, PE, protein S deficiency Duration: indefinite Supervising physician: Paul Ryan  Anticoagulation Clinic Visit History: Patient does not report signs/symptoms of bleeding or thromboembolism. He states he may have taken a double dose on accident due to travelling, challenges remembering his dose.  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  ARMOUR THYROID 60 MG tablet TAKE 1 TABLET BY MOUTH AS DIRECTED ALTERNATING WITH THE 90 MG  ARMOUR THYROID 90 MG tablet TAKE 1 TABLET BY MOUTH AS DIRECTED ALTERNATING WITH THE 60 MG  b complex vitamins tablet Take 1 tablet by mouth daily.    cetirizine (ZYRTEC) 10 MG tablet Take 1 tablet (10 mg total) by mouth daily as needed for allergies.  Cholecalciferol 4000 UNITS TABS Take 1 tablet by mouth daily.  Coenzyme Q10 (COQ10 PO) Take 2 tablets by mouth daily.  famotidine (PEPCID) 40 MG tablet Take 1 tablet (40 mg total) by mouth daily as needed for heartburn or indigestion.  Krill Oil 300 MG CAPS 1 cap daily  metFORMIN (GLUCOPHAGE) 500 MG tablet Take 1 tablet (500 mg total) by mouth 2 (two) times daily with a meal.   montelukast (SINGULAIR) 10 MG tablet Take 1 tablet (10 mg total) by mouth at bedtime.  NIACIN PO Take 1 tablet by mouth 3 (three) times daily.   rosuvastatin (CRESTOR) 40 MG tablet Take 1 tablet (40 mg total) by mouth daily.  sildenafil (REVATIO) 20 MG tablet Take 1-5 tablets (20-100 mg total) by mouth daily as needed.  sulfamethoxazole-trimethoprim (BACTRIM DS,SEPTRA DS) 800-160 MG tablet Take 1 tablet by mouth 2 (two) times daily.  tacrolimus (PROTOPIC) 0.1 % ointment Apply topically 2 (two) times daily.  warfarin (COUMADIN) 4 MG tablet Take 1 tablet (4 mg total) by mouth daily.   Past Medical History:  Diagnosis Date  . Acid reflux 02/03/2016  . Allergy    seasonal  . Anxiety 12/01/2016  . Chronic anticoagulation 06/14/2013  . Coagulopathy ([place]) 05/16/2011  . Collagen vascular disease ([place])   . Cough 12/01/2016  . DVT (deep venous thrombosis) ([name])    takes coumadin  . Dysuria 02/27/2013  . Erectile dysfunction 12/28/2016  . 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 Hyperglycemia 12/26/2014  . Hyperlipidemia   . Hypothyroid 06/10/2011  . Insomnia 04/24/2016  . Left hip pain 12/17/2010  . MCI (mild cognitive impairment) 07/22/2016  . Nasal lesion 07/22/2016  . Polymyalgia ([place]) 03/21/2014  . 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 Preventative health care 12/17/2010  . Shingles 1982   right abdominal wall  .  Staph skin infection 07/22/2016  . Thyroid nodule 11/29/2014  . Urinary hesitancy 09/30/2016  . Vitamin D deficiency 10/14/2011   Social History   Socioeconomic History  . Marital status: Married    Spouse name: Not on file  . Number of children: Not on file  . Years of education: Not on file  . Highest education level: Not on file  Social Needs  . Financial resource strain: Not on file  . Food insecurity - worry: Not on file  . Food insecurity - inability: Not on file  . Transportation needs - medical: Not on file  . Transportation needs - non-medical:  Not on file  Occupational History  . Not on file  Tobacco Use  . Smoking status: Former Smoker    Packs/day: 1.00    Years: 20.00    Pack years: 20.00    Types: Cigarettes    Last attempt to quit: 03/03/1980    Years since quitting: 37.0  . Smokeless tobacco: Never Used  Substance and Sexual Activity  . Alcohol use: No    Alcohol/week: 0.0 oz  . Drug use: No  . Sexual activity: Not on file  Other Topics Concern  . Not on file  Social History Narrative  . Not on file   Family History  Problem Relation Age of Onset  . Cancer Mother        unknown- everywhere  . Cancer Sister        breast   ASSESSMENT Recent Results: Lab Results  Component Value Date   INR 2.3 03/17/2017   INR 2.6 03/03/2017   INR 4.4 02/17/2017   PROTIME 15.6 (H) 05/17/2013   Anticoagulation Dosing: Description   Dr. Beryle Beams patient     INR today: Therapeutic  PLAN Weekly dose was unchanged  Patient Instructions  Patient educated about medication as defined in this encounter and verbalized understanding by repeating back instructions provided.    Patient advised to contact clinic or seek medical attention if signs/symptoms of bleeding or thromboembolism occur.  Patient verbalized understanding by repeating back information and was advised to contact me if further medication-related questions arise. Patient was also provided an information handout.  Follow-up Return in about 1 week (around 03/24/2017).  [name]

## 2017-03-25 NOTE — Patient Instructions (Signed)
Patient educated about medication as defined in this encounter and verbalized understanding by repeating back instructions provided.   

## 2017-03-25 NOTE — Progress Notes (Addendum)
Anticoagulation Management Paul Ryan is a 72 y.o. male who reports to the clinic for monitoring of warfarin treatment.    Indication: History DVT, PE, protein S deficiency Duration: indefinite Supervising physician: Murriel Hopper  Anticoagulation Clinic Visit History: Patient does not report signs/symptoms of bleeding or thromboembolism. He has been on Bactrim for the past week.  Anticoagulation Episode Summary    Current INR goal:   2.0-3.0  TTR:   71.1 % (2.6 y)  Next INR check:   03/30/2017  INR from last check:   4.4! 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  ARMOUR THYROID 60 MG tablet TAKE 1 TABLET BY MOUTH AS DIRECTED ALTERNATING WITH THE 90 MG  ARMOUR THYROID 90 MG tablet TAKE 1 TABLET BY MOUTH AS DIRECTED ALTERNATING WITH THE 60 MG  b complex vitamins tablet Take 1 tablet by mouth daily.    cetirizine (ZYRTEC) 10 MG tablet Take 1 tablet (10 mg total) by mouth daily as needed for allergies.  Cholecalciferol 4000 UNITS TABS Take 1 tablet by mouth daily.  Coenzyme Q10 (COQ10 PO) Take 2 tablets by mouth daily.  famotidine (PEPCID) 40 MG tablet Take 1 tablet (40 mg total) by mouth daily as needed for heartburn or indigestion.  Krill Oil 300 MG CAPS 1 cap daily  metFORMIN (GLUCOPHAGE) 500 MG tablet Take 1 tablet (500 mg total) by mouth 2 (two) times daily with a meal.  montelukast (SINGULAIR) 10 MG tablet Take 1 tablet (10 mg total) by  mouth at bedtime.  NIACIN PO Take 1 tablet by mouth 3 (three) times daily.   rosuvastatin (CRESTOR) 40 MG tablet Take 1 tablet (40 mg total) by mouth daily.  sildenafil (REVATIO) 20 MG tablet Take 1-5 tablets (20-100 mg total) by mouth daily as needed.  sulfamethoxazole-trimethoprim (BACTRIM DS,SEPTRA DS) 800-160 MG tablet Take 1 tablet by mouth 2 (two) times daily.  tacrolimus (PROTOPIC) 0.1 % ointment Apply topically 2 (two) times daily.  warfarin (COUMADIN) 4 MG tablet Take 1 tablet (4 mg total) by mouth daily.   Past Medical History:  Diagnosis Date  . Acid reflux 02/03/2016  . Allergy    seasonal  . Anxiety 12/01/2016  . Chronic anticoagulation 06/14/2013  . Coagulopathy ([place]) 05/16/2011  . Collagen vascular disease ([place])   . Cough 12/01/2016  . DVT (deep venous thrombosis) ([name])    takes coumadin  . Dysuria 02/27/2013  . Erectile dysfunction 12/28/2016  . 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 Hyperglycemia 12/26/2014  . Hyperlipidemia   . Hypothyroid 06/10/2011  . Insomnia 04/24/2016  . Left hip pain 12/17/2010  . MCI (mild cognitive impairment) 07/22/2016  . Nasal lesion 07/22/2016  . Polymyalgia ([place]) 03/21/2014  . 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 Preventative health care 12/17/2010  . Shingles 1982   right abdominal wall  . Staph skin infection 07/22/2016  . Thyroid nodule  11/29/2014  . Urinary hesitancy 09/30/2016  . Vitamin D deficiency 10/14/2011   Social History   Socioeconomic History  . Marital status: Married    Spouse name: Not on file  . Number of children: Not on file  . Years of education: Not on file  . Highest education level: Not on file  Social Needs  . Financial resource strain: Not on file  . Food insecurity - worry: Not on file  . Food insecurity - inability: Not on file  . Transportation needs - medical: Not on file  . Transportation needs - non-medical: Not on file  Occupational History  . Not on file  Tobacco Use  .  Smoking status: Former Smoker    Packs/day: 1.00    Years: 20.00    Pack years: 20.00    Types: Cigarettes    Last attempt to quit: 03/03/1980    Years since quitting: 37.0  . Smokeless tobacco: Never Used  Substance and Sexual Activity  . Alcohol use: No    Alcohol/week: 0.0 oz  . Drug use: No  . Sexual activity: Not on file  Other Topics Concern  . Not on file  Social History Narrative  . Not on file   Family History  Problem Relation Age of Onset  . Cancer Mother        unknown- everywhere  . Cancer Sister        breast   ASSESSMENT Recent Results: Lab Results  Component Value Date   INR 4.4 03/25/2017   INR 2.3 03/17/2017   INR 2.6 03/03/2017   PROTIME 15.6 (H) 05/17/2013   Anticoagulation Dosing: Description   Dr. Beryle Beams patient     INR today: Supratherapeutic  PLAN Hold dose today, follow up in clinic tomorrow. Appointment scheduled.  Patient Instructions  Patient educated about medication as defined in this encounter and verbalized understanding by repeating back instructions provided.   Patient advised to contact clinic or seek medical attention if signs/symptoms of bleeding or thromboembolism occur.  Patient verbalized understanding by repeating back information and was advised to contact me if further medication-related questions arise. Patient was also provided an information handout.  Follow-up 03/26/17  [name]

## 2017-03-26 ENCOUNTER — Ambulatory Visit (INDEPENDENT_AMBULATORY_CARE_PROVIDER_SITE_OTHER): Payer: Medicare Other | Admitting: Pharmacist

## 2017-03-26 DIAGNOSIS — Z5181 Encounter for therapeutic drug level monitoring: Secondary | ICD-10-CM | POA: Diagnosis not present

## 2017-03-26 DIAGNOSIS — D688 Other specified coagulation defects: Secondary | ICD-10-CM | POA: Diagnosis not present

## 2017-03-26 DIAGNOSIS — Z7901 Long term (current) use of anticoagulants: Secondary | ICD-10-CM | POA: Diagnosis not present

## 2017-03-26 DIAGNOSIS — I82409 Acute embolism and thrombosis of unspecified deep veins of unspecified lower extremity: Secondary | ICD-10-CM

## 2017-03-26 DIAGNOSIS — I2699 Other pulmonary embolism without acute cor pulmonale: Secondary | ICD-10-CM

## 2017-03-26 DIAGNOSIS — D6859 Other primary thrombophilia: Secondary | ICD-10-CM

## 2017-03-26 DIAGNOSIS — I82509 Chronic embolism and thrombosis of unspecified deep veins of unspecified lower extremity: Secondary | ICD-10-CM

## 2017-03-26 DIAGNOSIS — D689 Coagulation defect, unspecified: Secondary | ICD-10-CM

## 2017-03-26 LAB — PROTIME-INR

## 2017-03-26 LAB — POCT INR: INR: 4.1

## 2017-03-26 NOTE — Progress Notes (Signed)
Anticoagulation Management Paul Ryan a 72 y.o.malewho reports to the clinic for monitoring of warfarintreatment.   Indication:History DVT, PE, protein S deficiency Duration:indefinite Supervising physician:James Granfortuna  Anticoagulation Clinic Visit History: [name] not report signs/symptoms of bleeding or thromboembolism. Other recent changes: Patient has been on Bactrim 2 weeks and has 2 weeks remaining.  Anticoagulation Episode Summary    Current INR goal:   2.0-3.0  TTR:   71.0 % (2.6 y)  Next INR check:   03/30/2017  INR from last check:   4.1! 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  ARMOUR THYROID 60 MG tablet TAKE 1 TABLET BY MOUTH AS DIRECTED ALTERNATING WITH THE 90 MG  ARMOUR THYROID 90 MG tablet TAKE 1 TABLET BY MOUTH AS DIRECTED ALTERNATING WITH THE 60 MG  b complex vitamins tablet Take 1 tablet by mouth daily.    cetirizine (ZYRTEC) 10 MG tablet Take 1 tablet (10 mg total) by mouth daily as needed for allergies.  Cholecalciferol 4000 UNITS TABS Take 1 tablet by mouth daily.  Coenzyme Q10 (COQ10 PO) Take 2 tablets by mouth daily.  famotidine (PEPCID) 40 MG tablet Take 1 tablet (40 mg total) by mouth daily as needed for heartburn or indigestion.  Krill Oil 300 MG CAPS 1 cap daily  metFORMIN (GLUCOPHAGE) 500 MG tablet Take 1 tablet (500 mg total) by mouth 2 (two) times daily with a meal.  montelukast (SINGULAIR) 10 MG  tablet Take 1 tablet (10 mg total) by mouth at bedtime.  NIACIN PO Take 1 tablet by mouth 3 (three) times daily.   rosuvastatin (CRESTOR) 40 MG tablet Take 1 tablet (40 mg total) by mouth daily.  sildenafil (REVATIO) 20 MG tablet Take 1-5 tablets (20-100 mg total) by mouth daily as needed.  sulfamethoxazole-trimethoprim (BACTRIM DS,SEPTRA DS) 800-160 MG tablet Take 1 tablet by mouth 2 (two) times daily.  tacrolimus (PROTOPIC) 0.1 % ointment Apply topically 2 (two) times daily.  warfarin (COUMADIN) 4 MG tablet Take 1 tablet (4 mg total) by mouth daily.   Past Medical History:  Diagnosis Date  . Acid reflux 02/03/2016  . Allergy    seasonal  . Anxiety 12/01/2016  . Chronic anticoagulation 06/14/2013  . Coagulopathy ([place]) 05/16/2011  . Collagen vascular disease ([place])   . Cough 12/01/2016  . DVT (deep venous thrombosis) ([name])    takes coumadin  . Dysuria 02/27/2013  . Erectile dysfunction 12/28/2016  . 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 Hyperglycemia 12/26/2014  . Hyperlipidemia   . Hypothyroid 06/10/2011  . Insomnia 04/24/2016  . Left hip pain 12/17/2010  . MCI (mild cognitive impairment) 07/22/2016  . Nasal lesion 07/22/2016  . Polymyalgia ([place]) 03/21/2014  . 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 Preventative health care 12/17/2010  . Shingles 1982   right abdominal wall  . Staph skin infection 07/22/2016  . Thyroid nodule 11/29/2014  .  Urinary hesitancy 09/30/2016  . Vitamin D deficiency 10/14/2011   Social History   Socioeconomic History  . Marital status: Married    Spouse name: Not on file  . Number of children: Not on file  . Years of education: Not on file  . Highest education level: Not on file  Social Needs  . Financial resource strain: Not on file  . Food insecurity - worry: Not on file  . Food insecurity - inability: Not on file  . Transportation needs - medical: Not on file  . Transportation needs - non-medical: Not on file  Occupational  History  . Not on file  Tobacco Use  . Smoking status: Former Smoker    Packs/day: 1.00    Years: 20.00    Pack years: 20.00    Types: Cigarettes    Last attempt to quit: 03/03/1980    Years since quitting: 37.0  . Smokeless tobacco: Never Used  Substance and Sexual Activity  . Alcohol use: No    Alcohol/week: 0.0 oz  . Drug use: No  . Sexual activity: Not on file  Other Topics Concern  . Not on file  Social History Narrative  . Not on file   Family History  Problem Relation Age of Onset  . Cancer Mother        unknown- everywhere  . Cancer Sister        breast   ASSESSMENT Recent Results: Lab Results  Component Value Date   INR 4.1 03/26/2017   INR 4.4 03/25/2017   INR 2.3 03/17/2017   PROTIME 15.6 (H) 05/17/2013   Anticoagulation Dosing: Description   Dr. Beryle Beams patient     INR today: Supratherapeutic  PLAN INR was 4.4 yesterday, held 1 dose, and weekly dose was decreased by 30%, follow up in 4 days.  Patient Instructions  Patient educated about medication as defined in this encounter and verbalized understanding by repeating back instructions provided.   Patient advised to contact clinic or seek medical attention if signs/symptoms of bleeding or thromboembolism occur.  Patient verbalized understanding by repeating back information and was advised to contact me if further medication-related questions arise. Patient was also provided an information handout.  Follow-up Return in about 4 days (around 03/30/2017).  [name]  20 minutes spent face-to-face with the patient during the encounter. 50% of time spent on education. 50% of time was spent on assessment, plan, and coordination of care.

## 2017-03-26 NOTE — Addendum Note (Signed)
Addended by: Forde Dandy on: 03/26/2017 02:08 PM   Modules accepted: Orders

## 2017-03-26 NOTE — Progress Notes (Signed)
Reviewed Thanks DrG 

## 2017-03-26 NOTE — Patient Instructions (Signed)
Patient educated about medication as defined in this encounter and verbalized understanding by repeating back instructions provided.   

## 2017-03-30 ENCOUNTER — Encounter: Payer: Self-pay | Admitting: Pharmacist

## 2017-03-30 DIAGNOSIS — Z7901 Long term (current) use of anticoagulants: Secondary | ICD-10-CM

## 2017-03-30 DIAGNOSIS — I2699 Other pulmonary embolism without acute cor pulmonale: Secondary | ICD-10-CM

## 2017-03-30 DIAGNOSIS — I82509 Chronic embolism and thrombosis of unspecified deep veins of unspecified lower extremity: Secondary | ICD-10-CM

## 2017-03-30 DIAGNOSIS — D6859 Other primary thrombophilia: Secondary | ICD-10-CM

## 2017-03-30 DIAGNOSIS — D689 Coagulation defect, unspecified: Secondary | ICD-10-CM

## 2017-03-30 LAB — POCT INR: INR: 2.2

## 2017-03-30 NOTE — Progress Notes (Signed)
Anticoagulation Management Paul Ryan a 72 y.o.malewho reports to the clinic for monitoring of warfarintreatment.   Indication:History DVT, PE, protein S deficiency Duration:indefinite Supervising physician:James Granfortuna  Anticoagulation Clinic Visit History: [name] not report signs/symptoms of bleeding or thromboembolism. He has been on Bactrim for 3 weeks for UTI and has 1 week of therapy remaining.  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  ARMOUR THYROID 60 MG tablet TAKE 1 TABLET BY MOUTH AS DIRECTED ALTERNATING WITH THE 90 MG  ARMOUR THYROID 90 MG tablet TAKE 1 TABLET BY MOUTH AS DIRECTED ALTERNATING WITH THE 60 MG  b complex vitamins tablet Take 1 tablet by mouth daily.    cetirizine (ZYRTEC) 10 MG tablet Take 1 tablet (10 mg total) by mouth daily as needed for allergies.  Cholecalciferol 4000 UNITS TABS Take 1 tablet by mouth daily.  Coenzyme Q10 (COQ10 PO) Take 2 tablets by mouth daily.  famotidine (PEPCID) 40 MG tablet Take 1 tablet (40 mg total) by mouth daily as needed for heartburn or indigestion.  Krill Oil 300 MG CAPS 1 cap daily  metFORMIN (GLUCOPHAGE) 500 MG tablet Take 1 tablet (500 mg total) by mouth 2 (two) times daily with a meal.  montelukast (SINGULAIR) 10 MG tablet  Take 1 tablet (10 mg total) by mouth at bedtime.  NIACIN PO Take 1 tablet by mouth 3 (three) times daily.   rosuvastatin (CRESTOR) 40 MG tablet Take 1 tablet (40 mg total) by mouth daily.  sildenafil (REVATIO) 20 MG tablet Take 1-5 tablets (20-100 mg total) by mouth daily as needed.  sulfamethoxazole-trimethoprim (BACTRIM DS,SEPTRA DS) 800-160 MG tablet Take 1 tablet by mouth 2 (two) times daily.  tacrolimus (PROTOPIC) 0.1 % ointment Apply topically 2 (two) times daily.  warfarin (COUMADIN) 4 MG tablet Take 1 tablet (4 mg total) by mouth daily.   Past Medical History:  Diagnosis Date  . Acid reflux 02/03/2016  . Allergy    seasonal  . Anxiety 12/01/2016  . Chronic anticoagulation 06/14/2013  . Coagulopathy ([place]) 05/16/2011  . Collagen vascular disease ([place])   . Cough 12/01/2016  . DVT (deep venous thrombosis) ([name])    takes coumadin  . Dysuria 02/27/2013  . Erectile dysfunction 12/28/2016  . 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 Hyperglycemia 12/26/2014  . Hyperlipidemia   . Hypothyroid 06/10/2011  . Insomnia 04/24/2016  . Left hip pain 12/17/2010  . MCI (mild cognitive impairment) 07/22/2016  . Nasal lesion 07/22/2016  . Polymyalgia ([place]) 03/21/2014  . 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 Preventative health care 12/17/2010  . Shingles 1982   right abdominal wall  . Staph skin infection 07/22/2016  . Thyroid nodule 11/29/2014  .  Urinary hesitancy 09/30/2016  . Vitamin D deficiency 10/14/2011   Social History   Socioeconomic History  . Marital status: Married    Spouse name: Not on file  . Number of children: Not on file  . Years of education: Not on file  . Highest education level: Not on file  Social Needs  . Financial resource strain: Not on file  . Food insecurity - worry: Not on file  . Food insecurity - inability: Not on file  . Transportation needs - medical: Not on file  . Transportation needs - non-medical: Not on file  Occupational History  .  Not on file  Tobacco Use  . Smoking status: Former Smoker    Packs/day: 1.00    Years: 20.00    Pack years: 20.00    Types: Cigarettes    Last attempt to quit: 03/03/1980    Years since quitting: 37.0  . Smokeless tobacco: Never Used  Substance and Sexual Activity  . Alcohol use: No    Alcohol/week: 0.0 oz  . Drug use: No  . Sexual activity: Not on file  Other Topics Concern  . Not on file  Social History Narrative  . Not on file   Family History  Problem Relation Age of Onset  . Cancer Mother        unknown- everywhere  . Cancer Sister        breast   ASSESSMENT Recent Results: Lab Results  Component Value Date   INR 2.2 03/30/2017   INR 4.1 03/26/2017   INR 4.4 03/24/2017   PROTIME 15.6 (H) 05/17/2013   Anticoagulation Dosing: Description   Dr. Beryle Beams patient     INR today: Therapeutic  PLAN Weekly dose was unchanged  There are no Patient Instructions on file for this visit. Patient advised to contact clinic or seek medical attention if signs/symptoms of bleeding or thromboembolism occur.  Patient verbalized understanding by repeating back information and was advised to contact me if further medication-related questions arise. Patient was also provided an information handout.  Follow-up Return in about 1 week (around 04/06/2017).  [name]

## 2017-03-30 NOTE — Patient Instructions (Signed)
Patient educated about medication as defined in this encounter and verbalized understanding by repeating back instructions provided.   

## 2017-03-31 LAB — PROTIME-INR

## 2017-03-31 NOTE — Progress Notes (Signed)
Reviewed thx DrG 

## 2017-04-02 ENCOUNTER — Other Ambulatory Visit: Payer: Self-pay

## 2017-04-07 ENCOUNTER — Encounter: Payer: Self-pay | Admitting: Pharmacist

## 2017-04-07 DIAGNOSIS — D689 Coagulation defect, unspecified: Secondary | ICD-10-CM

## 2017-04-07 DIAGNOSIS — I2699 Other pulmonary embolism without acute cor pulmonale: Secondary | ICD-10-CM

## 2017-04-07 DIAGNOSIS — I82509 Chronic embolism and thrombosis of unspecified deep veins of unspecified lower extremity: Secondary | ICD-10-CM

## 2017-04-07 DIAGNOSIS — D6859 Other primary thrombophilia: Secondary | ICD-10-CM

## 2017-04-07 DIAGNOSIS — Z7901 Long term (current) use of anticoagulants: Secondary | ICD-10-CM

## 2017-04-07 LAB — POCT INR: INR: 2.1

## 2017-04-07 NOTE — Progress Notes (Signed)
Patient reports no signs or symptoms of concern today. Bactrim course will be finished in 3 days, plan to return to 4 mg daily after course completed. For now, patient continued on same dose of 4 mg daily except 2 mg on Mondays, Wednesdays, and Fridays. Follow up in 1 week. Patient verbalized understanding.

## 2017-04-15 ENCOUNTER — Encounter: Payer: Self-pay | Admitting: Pharmacist

## 2017-04-15 DIAGNOSIS — I82509 Chronic embolism and thrombosis of unspecified deep veins of unspecified lower extremity: Secondary | ICD-10-CM

## 2017-04-15 DIAGNOSIS — Z7901 Long term (current) use of anticoagulants: Secondary | ICD-10-CM

## 2017-04-15 DIAGNOSIS — D6859 Other primary thrombophilia: Secondary | ICD-10-CM

## 2017-04-15 DIAGNOSIS — I2699 Other pulmonary embolism without acute cor pulmonale: Secondary | ICD-10-CM

## 2017-04-15 DIAGNOSIS — D689 Coagulation defect, unspecified: Secondary | ICD-10-CM

## 2017-04-15 LAB — POCT INR: INR: 2.3

## 2017-04-16 ENCOUNTER — Ambulatory Visit: Payer: Medicare Other | Admitting: Family Medicine

## 2017-04-16 VITALS — BP 100/60 | HR 56 | Temp 97.5°F | Resp 18 | Wt 166.0 lb

## 2017-04-16 DIAGNOSIS — R300 Dysuria: Secondary | ICD-10-CM

## 2017-04-16 DIAGNOSIS — R739 Hyperglycemia, unspecified: Secondary | ICD-10-CM

## 2017-04-16 DIAGNOSIS — R05 Cough: Secondary | ICD-10-CM

## 2017-04-16 DIAGNOSIS — D689 Coagulation defect, unspecified: Secondary | ICD-10-CM

## 2017-04-16 DIAGNOSIS — E039 Hypothyroidism, unspecified: Secondary | ICD-10-CM

## 2017-04-16 DIAGNOSIS — E785 Hyperlipidemia, unspecified: Secondary | ICD-10-CM | POA: Diagnosis not present

## 2017-04-16 DIAGNOSIS — R350 Frequency of micturition: Secondary | ICD-10-CM

## 2017-04-16 DIAGNOSIS — M353 Polymyalgia rheumatica: Secondary | ICD-10-CM | POA: Diagnosis not present

## 2017-04-16 DIAGNOSIS — R059 Cough, unspecified: Secondary | ICD-10-CM

## 2017-04-16 DIAGNOSIS — F419 Anxiety disorder, unspecified: Secondary | ICD-10-CM

## 2017-04-16 NOTE — Progress Notes (Signed)
Subjective:  I acted as a Education administrator for Dr. Charlett Blake. [place], [place]  Patient ID: Paul Ryan, male    DOB: [birth year], 72 y.o.   MRN: [id number]  No chief complaint on file.   HPI  Patient is in today for evaluation of dysuria and urinary frequency. No fevers, chills, abdominal or back pain. Notes  Previously when he was on Bactrim these symptoms disappeared but ha ve returned off meds. He is under a great deal of stress caring for his wife after cancer treatments. Denies CP/palp/SOB/HA/congestion/fevers/GI c/o. Taking meds as prescribed  Patient Care Team: [name], MD as PCP - General (Family Medicine) Roena Malady, MD (Ophthalmology) Forde Dandy, PharmD as Pharmacist (Pharmacist)   Past Medical History:  Diagnosis Date  . Acid reflux 02/03/2016  . Allergy    seasonal  . Anxiety 12/01/2016  . Chronic anticoagulation 06/14/2013  . Coagulopathy ([place]) 05/16/2011  . Collagen vascular disease ([place])   . Cough 12/01/2016  . DVT (deep venous thrombosis) ([name])    takes coumadin  . Dysuria 02/27/2013  . Erectile dysfunction 12/28/2016  . 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 Hyperglycemia 12/26/2014  . Hyperlipidemia   . Hypothyroid 06/10/2011  . Insomnia 04/24/2016  . Left hip pain 12/17/2010  . MCI (mild cognitive impairment) 07/22/2016  . Nasal lesion 07/22/2016  . Polymyalgia ([place]) 03/21/2014  . 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 Preventative health care 12/17/2010  . Shingles 1982   right abdominal wall  . Staph skin infection 07/22/2016  . Thyroid nodule 11/29/2014  . Urinary hesitancy 09/30/2016  . Vitamin D deficiency 10/14/2011    Past Surgical History:  Procedure Laterality Date  . 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 Cancer Mother        unknown- everywhere  . Cancer Sister        breast    Social History   Socioeconomic History  . Marital status: Married      Spouse name: Not on file  . Number of children: Not on file  . Years of education: Not on file  . Highest education level: Not on file  Social Needs  . Financial resource strain: Not on file  . Food insecurity - worry: Not on file  . Food insecurity - inability: Not on file  . Transportation needs - medical: Not on file  . Transportation needs - non-medical: Not on file  Occupational History  . Not on file  Tobacco Use  . Smoking status: Former Smoker    Packs/day: 1.00    Years: 20.00    Pack years: 20.00    Types: Cigarettes    Last attempt to quit: 03/03/1980    Years since quitting: 37.1  . Smokeless tobacco: Never Used  Substance and Sexual Activity  . Alcohol use: No    Alcohol/week: 0.0 oz  . Drug use: No  . Sexual activity: Not on file  Other Topics Concern  . Not on file  Social History Narrative  . Not on file    Outpatient Medications Prior to Visit  Medication Sig Dispense Refill  . albuterol (PROVENTIL HFA;VENTOLIN HFA) 108 (90 Base) MCG/ACT inhaler Inhale 2 puffs into the lungs every 4 (four) hours as needed for wheezing or shortness of breath. 1 Inhaler 3  . ARMOUR THYROID 60 MG tablet TAKE 1 TABLET BY MOUTH AS DIRECTED ALTERNATING  WITH THE 90 MG 45 tablet 2  . ARMOUR THYROID 90 MG tablet TAKE 1 TABLET BY MOUTH AS DIRECTED ALTERNATING WITH THE 60 MG 45 tablet 2  . b complex vitamins tablet Take 1 tablet by mouth daily.      . cetirizine (ZYRTEC) 10 MG tablet Take 1 tablet (10 mg total) by mouth daily as needed for allergies. 30 tablet 5  . Cholecalciferol 4000 UNITS TABS Take 1 tablet by mouth daily.    . Coenzyme Q10 (COQ10 PO) Take 2 tablets by mouth daily.    . famotidine (PEPCID) 40 MG tablet Take 1 tablet (40 mg total) by mouth daily as needed for heartburn or indigestion. 30 tablet 5  . Krill Oil 300 MG CAPS 1 cap daily    . metFORMIN (GLUCOPHAGE) 500 MG tablet Take 1 tablet (500 mg total) by mouth 2 (two) times daily with a meal. 30 tablet 3  .  montelukast (SINGULAIR) 10 MG tablet Take 1 tablet (10 mg total) by mouth at bedtime. 30 tablet 3  . NIACIN PO Take 1 tablet by mouth 3 (three) times daily.     . rosuvastatin (CRESTOR) 40 MG tablet Take 1 tablet (40 mg total) by mouth daily. 90 tablet 1  . sildenafil (REVATIO) 20 MG tablet Take 1-5 tablets (20-100 mg total) by mouth daily as needed. 35 tablet 2  . sulfamethoxazole-trimethoprim (BACTRIM DS,SEPTRA DS) 800-160 MG tablet Take 1 tablet by mouth 2 (two) times daily. 28 tablet 0  . tacrolimus (PROTOPIC) 0.1 % ointment Apply topically 2 (two) times daily. 100 g 1  . warfarin (COUMADIN) 4 MG tablet Take 1 tablet (4 mg total) by mouth daily. 90 tablet 3   No facility-administered medications prior to visit.     No Known Allergies  Review of Systems  Constitutional: Positive for malaise/fatigue. Negative for chills and fever.  HENT: Negative for congestion and hearing loss.   Eyes: Negative for discharge.  Respiratory: Negative for cough, sputum production and shortness of breath.   Cardiovascular: Negative for chest pain, palpitations and leg swelling.  Gastrointestinal: Negative for abdominal pain, blood in stool, constipation, diarrhea, heartburn, nausea and vomiting.  Genitourinary: Positive for dysuria and frequency. Negative for hematuria and urgency.  Musculoskeletal: Negative for back pain, falls and myalgias.  Skin: Negative for rash.  Neurological: Negative for dizziness, sensory change, loss of consciousness, weakness and headaches.  Endo/Heme/Allergies: Negative for environmental allergies. Does not bruise/bleed easily.  Psychiatric/Behavioral: Negative for depression and suicidal ideas. The patient is nervous/anxious and has insomnia.        Objective:    Physical Exam  Constitutional: He is oriented to person, place, and time. He appears well-developed and well-nourished. No distress.  HENT:  Head: Normocephalic and atraumatic.  Nose: Nose normal.  Eyes: Right  eye exhibits no discharge. Left eye exhibits no discharge.  Neck: Normal range of motion. Neck supple.  Cardiovascular: Normal rate and regular rhythm.  Pulmonary/Chest: Effort normal and breath sounds normal.  Abdominal: Soft. Bowel sounds are normal. There is no tenderness.  Musculoskeletal: He exhibits no edema.  Neurological: He is alert and oriented to person, place, and time.  Skin: Skin is warm and dry.  Psychiatric: He has a normal mood and affect.  Nursing note and vitals reviewed.   BP 100/60 (BP Location: Left Arm, Patient Position: Sitting, Cuff Size: Normal)   Pulse (!) 56   Temp (!) 97.5 F (36.4 C) (Oral)   Resp 18   Wt 166 lb (  75.3 kg)   SpO2 98%   BMI 22.51 kg/m  Wt Readings from Last 3 Encounters:  04/16/17 166 lb (75.3 kg)  12/25/16 159 lb 6.4 oz (72.3 kg)  12/01/16 159 lb (72.1 kg)   BP Readings from Last 3 Encounters:  04/16/17 100/60  12/25/16 112/62  12/01/16 116/67     Immunization History  Administered Date(s) Administered  . Hepatitis A 11/09/1998  . IPV 11/09/1998  . Influenza Split 11/22/2015  . Influenza Whole 12/13/2012  . Influenza, High Dose Seasonal PF 10/31/2016  . Influenza,inj,Quad PF,6+ Mos 10/25/2013, 12/26/2014  . Influenza-Unspecified 12/02/2011  . PPD Test 03/23/2015  . Pneumococcal Conjugate-13 10/25/2013  . Pneumococcal Polysaccharide-23 06/19/2009, 12/26/2014  . Td 03/03/1990, 07/01/2001  . Tdap 12/17/2010  . Typhoid Inactivated 11/09/1998, 03/09/2001  . Zoster 08/01/2008    Health Maintenance  Topic Date Due  . FOOT EXAM  08/24/1954  . OPHTHALMOLOGY EXAM  08/24/1954  . URINE MICROALBUMIN  08/24/1954  . HEMOGLOBIN A1C  06/23/2017  . TETANUS/TDAP  12/16/2020  . COLONOSCOPY  06/20/2026  . INFLUENZA VACCINE  Completed  . Hepatitis C Screening  Completed  . PNA vac Low Risk Adult  Completed    Lab Results  Component Value Date   WBC 6.7 04/16/2017   HGB 14.6 04/16/2017   HCT 44.0 04/16/2017   PLT 252.0  04/16/2017   GLUCOSE 99 12/23/2016   CHOL 212 (H) 12/23/2016   TRIG 108.0 12/23/2016   HDL 42.30 12/23/2016   LDLDIRECT 158.0 03/11/2013   LDLCALC 148 (H) 12/23/2016   ALT 19 12/23/2016   AST 19 12/23/2016   NA 139 12/23/2016   K 4.6 12/23/2016   CL 105 12/23/2016   CREATININE 1.12 12/23/2016   BUN 16 12/23/2016   CO2 28 12/23/2016   TSH 0.35 12/23/2016   PSA 1.53 07/18/2016   INR 2.3 04/15/2017   HGBA1C 6.1 12/23/2016    Lab Results  Component Value Date   TSH 0.35 12/23/2016   Lab Results  Component Value Date   WBC 6.7 04/16/2017   HGB 14.6 04/16/2017   HCT 44.0 04/16/2017   MCV 91.8 04/16/2017   PLT 252.0 04/16/2017   Lab Results  Component Value Date   NA 139 12/23/2016   K 4.6 12/23/2016   CHLORIDE 109 11/16/2012   CO2 28 12/23/2016   GLUCOSE 99 12/23/2016   BUN 16 12/23/2016   CREATININE 1.12 12/23/2016   BILITOT 0.6 12/23/2016   ALKPHOS 49 12/23/2016   AST 19 12/23/2016   ALT 19 12/23/2016   PROT 7.1 12/23/2016   ALBUMIN 3.8 12/23/2016   CALCIUM 9.3 12/23/2016   ANIONGAP 8 09/11/2014   GFR 68.43 12/23/2016   Lab Results  Component Value Date   CHOL 212 (H) 12/23/2016   Lab Results  Component Value Date   HDL 42.30 12/23/2016   Lab Results  Component Value Date   LDLCALC 148 (H) 12/23/2016   Lab Results  Component Value Date   TRIG 108.0 12/23/2016   Lab Results  Component Value Date   CHOLHDL 5 12/23/2016   Lab Results  Component Value Date   HGBA1C 6.1 12/23/2016         Assessment & Plan:   Problem List Items Addressed This Visit    Hyperlipidemia - Primary   Relevant Orders   Lipid panel   Coagulopathy ([place])    Tolerating Coumauin      Hypothyroid    continue to monitor      Relevant  Orders   TSH   Polymyalgia rheumatica ([name])    No recent flares      Hyperglycemia    hgba1c acceptable, minimize simple carbs. Increase exercise as tolerated.      Relevant Orders   Hemoglobin A1c   CBC   Comprehensive  metabolic panel   Urinary frequency    With some dysuria. Encouraged to hydrate well. Add Zinc 50 mg daily, and consider diagnosis of Interstitial cystitis. Can consider a trial of meds vs a referral if symptoms persist      Cough   Anxiety    Has been under a great deal of stress caring for his wife after her surgery for cancer. He has used Alprazolam prn with acceptable results       Other Visit Diagnoses    Dysuria       Relevant Orders   Urinalysis   Urine Culture (Completed)   CBC w/Diff (Completed)   Sedimentation rate (Completed)      I am having Unknown Foley maintain his b complex vitamins, Krill Oil, tacrolimus, NIACIN PO, Cholecalciferol, Coenzyme Q10 (COQ10 PO), albuterol, rosuvastatin, warfarin, ARMOUR THYROID, ARMOUR THYROID, famotidine, cetirizine, montelukast, sulfamethoxazole-trimethoprim, sildenafil, and metFORMIN.  No orders of the defined types were placed in this encounter.   CMA served as Education administrator during this visit. History, Physical and Plan performed by medical provider. Documentation and orders reviewed and attested to.  Penni Homans, MD

## 2017-04-16 NOTE — Patient Instructions (Signed)
Add Zinc 50 mg daily  Interstitial Cystitis Interstitial cystitis is a condition that causes inflammation of the bladder. The bladder is a hollow organ in the lower part of your abdomen. It stores urine after the urine is made by your kidneys. With interstitial cystitis, you may have pain in the bladder area. You may also have a frequent and urgent need to urinate. The severity of interstitial cystitis can vary from person to person. You may have flare-ups of the condition, and then it may go away for a while. For many people who have this condition, it becomes a long-term problem. What are the causes? The cause of this condition is not known. What increases the risk? This condition is more likely to develop in women. What are the signs or symptoms? Symptoms of interstitial cystitis vary, and they can change over time. Symptoms may include:  Discomfort or pain in the bladder area. This can range from mild to severe. The pain may change in intensity as the bladder fills with urine or as it empties.  Pelvic pain.  An urgent need to urinate.  Frequent urination.  Pain during sexual intercourse.  Pinpoint bleeding on the bladder wall.  For women, the symptoms often get worse during menstruation. How is this diagnosed? This condition is diagnosed by evaluating your symptoms and ruling out other causes. A physical exam will be done. Various tests may be done to rule out other conditions. Common tests include:  Urine tests.  Cystoscopy. In this test, a tool that is like a very thin telescope is used to look into your bladder.  Biopsy. This involves taking a sample of tissue from the bladder wall to be examined under a microscope.  How is this treated? There is no cure for interstitial cystitis, but treatment methods are available to control your symptoms. Work closely with your health care provider to find the treatments that will be most effective for you. Treatment options may  include:  Medicines to relieve pain and to help reduce the number of times that you feel the need to urinate.  Bladder training. This involves learning ways to control when you urinate, such as: ? Urinating at scheduled times. ? Training yourself to delay urination. ? Doing exercises (Kegel exercises) to strengthen the muscles that control urine flow.  Lifestyle changes, such as changing your diet or taking steps to control stress.  Use of a device that provides electrical stimulation in order to reduce pain.  A procedure that stretches your bladder by filling it with air or fluid.  Surgery. This is rare. It is only done for extreme cases if other treatments do not help.  Follow these instructions at home:  Take medicines only as directed by your health care provider.  Use bladder training techniques as directed. ? Keep a bladder diary to find out which foods, liquids, or activities make your symptoms worse. ? Use your bladder diary to schedule bathroom trips. If you are away from home, plan to be near a bathroom at each of your scheduled times. ? Make sure you urinate just before you leave the house and just before you go to bed.  Do Kegel exercises as directed by your health care provider.  Do not drink alcohol.  Do not use any tobacco products, including cigarettes, chewing tobacco, or electronic cigarettes. If you need help quitting, ask your health care provider.  Make dietary changes as directed by your health care provider. You may need to avoid spicy foods and  foods that contain a high amount of potassium.  Limit your drinking of beverages that stimulate urination. These include soda, coffee, and tea.  Keep all follow-up visits as directed by your health care provider. This is important. Contact a health care provider if:  Your symptoms do not get better after treatment.  Your pain and discomfort are getting worse.  You have more frequent urges to urinate.  You  have a fever. Get help right away if:  You are not able to control your bladder at all. This information is not intended to replace advice given to you by your health care provider. Make sure you discuss any questions you have with your health care provider. Document Released: 10/19/2003 Document Revised: 07/26/2015 Document Reviewed: 10/25/2013 Elsevier Interactive Patient Education  [employer].

## 2017-04-17 LAB — URINALYSIS, ROUTINE W REFLEX MICROSCOPIC
BILIRUBIN URINE: NEGATIVE
KETONES UR: NEGATIVE
LEUKOCYTES UA: NEGATIVE
Nitrite: NEGATIVE
PH: 6 (ref 5.0–8.0)
Specific Gravity, Urine: 1.02 (ref 1.000–1.030)
TOTAL PROTEIN, URINE-UPE24: NEGATIVE
URINE GLUCOSE: 250 — AB
UROBILINOGEN UA: 0.2 (ref 0.0–1.0)

## 2017-04-17 LAB — URINE CULTURE
MICRO NUMBER:: 90199612
SPECIMEN QUALITY:: ADEQUATE

## 2017-04-17 LAB — CBC WITH DIFFERENTIAL/PLATELET
BASOS ABS: 0 10*3/uL (ref 0.0–0.1)
Basophils Relative: 0.5 % (ref 0.0–3.0)
EOS ABS: 0.2 10*3/uL (ref 0.0–0.7)
Eosinophils Relative: 2.5 % (ref 0.0–5.0)
HEMATOCRIT: 44 % (ref 39.0–52.0)
HEMOGLOBIN: 14.6 g/dL (ref 13.0–17.0)
LYMPHS PCT: 20.9 % (ref 12.0–46.0)
Lymphs Abs: 1.4 10*3/uL (ref 0.7–4.0)
MCHC: 33.1 g/dL (ref 30.0–36.0)
MCV: 91.8 fl (ref 78.0–100.0)
MONOS PCT: 7.5 % (ref 3.0–12.0)
Monocytes Absolute: 0.5 10*3/uL (ref 0.1–1.0)
Neutro Abs: 4.6 10*3/uL (ref 1.4–7.7)
Neutrophils Relative %: 68.6 % (ref 43.0–77.0)
Platelets: 252 10*3/uL (ref 150.0–400.0)
RBC: 4.79 Mil/uL (ref 4.22–5.81)
RDW: 14.7 % (ref 11.5–15.5)
WBC: 6.7 10*3/uL (ref 4.0–10.5)

## 2017-04-17 LAB — PROTIME-INR

## 2017-04-17 LAB — SEDIMENTATION RATE: Sed Rate: 4 mm/hr (ref 0–20)

## 2017-04-17 NOTE — Patient Instructions (Signed)
Patient educated about medication as defined in this encounter and verbalized understanding by repeating back instructions provided.   

## 2017-04-17 NOTE — Progress Notes (Signed)
Anticoagulation Management Xylon Croom a 72 y.o.malewho reports to the clinic for monitoring of warfarintreatment.   Indication:History DVT, PE, proteinSdeficiency Duration:indefinite Supervising physician:James Granfortuna  Anticoagulation Clinic Visit History: [name] not report signs/symptoms of bleeding or thromboembolism.He has been on Bactrim for 3 weeks for UTI and has 1 week of therapy remaining.  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  ARMOUR THYROID 60 MG tablet TAKE 1 TABLET BY MOUTH AS DIRECTED ALTERNATING WITH THE 90 MG  ARMOUR THYROID 90 MG tablet TAKE 1 TABLET BY MOUTH AS DIRECTED ALTERNATING WITH THE 60 MG  b complex vitamins tablet Take 1 tablet by mouth daily.    cetirizine (ZYRTEC) 10 MG tablet Take 1 tablet (10 mg total) by mouth daily as needed for allergies.  Cholecalciferol 4000 UNITS TABS Take 1 tablet by mouth daily.  Coenzyme Q10 (COQ10 PO) Take 2 tablets by mouth daily.  famotidine (PEPCID) 40 MG tablet Take 1 tablet (40 mg total) by mouth daily as needed for heartburn or indigestion.  Krill Oil 300 MG CAPS 1 cap daily  metFORMIN (GLUCOPHAGE) 500 MG tablet Take 1 tablet (500 mg total) by mouth 2 (two) times daily with a meal.  montelukast (SINGULAIR) 10 MG  tablet Take 1 tablet (10 mg total) by mouth at bedtime.  NIACIN PO Take 1 tablet by mouth 3 (three) times daily.   rosuvastatin (CRESTOR) 40 MG tablet Take 1 tablet (40 mg total) by mouth daily.  sildenafil (REVATIO) 20 MG tablet Take 1-5 tablets (20-100 mg total) by mouth daily as needed.  sulfamethoxazole-trimethoprim (BACTRIM DS,SEPTRA DS) 800-160 MG tablet Take 1 tablet by mouth 2 (two) times daily.  tacrolimus (PROTOPIC) 0.1 % ointment Apply topically 2 (two) times daily.  warfarin (COUMADIN) 4 MG tablet Take 1 tablet (4 mg total) by mouth daily.   Past Medical History:  Diagnosis Date  . Acid reflux 02/03/2016  . Allergy    seasonal  . Anxiety 12/01/2016  . Chronic anticoagulation 06/14/2013  . Coagulopathy ([place]) 05/16/2011  . Collagen vascular disease ([place])   . Cough 12/01/2016  . DVT (deep venous thrombosis) ([name])    takes coumadin  . Dysuria 02/27/2013  . Erectile dysfunction 12/28/2016  . 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 Hyperglycemia 12/26/2014  . Hyperlipidemia   . Hypothyroid 06/10/2011  . Insomnia 04/24/2016  . Left hip pain 12/17/2010  . MCI (mild cognitive impairment) 07/22/2016  . Nasal lesion 07/22/2016  . Polymyalgia ([place]) 03/21/2014  . 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 Preventative health care 12/17/2010  . Shingles 1982   right abdominal wall  . Staph skin infection 07/22/2016  . Thyroid nodule 11/29/2014  . Urinary  hesitancy 09/30/2016  . Vitamin D deficiency 10/14/2011   Social History   Socioeconomic History  . Marital status: Married    Spouse name: Not on file  . Number of children: Not on file  . Years of education: Not on file  . Highest education level: Not on file  Social Needs  . Financial resource strain: Not on file  . Food insecurity - worry: Not on file  . Food insecurity - inability: Not on file  . Transportation needs - medical: Not on file  . Transportation needs - non-medical: Not on file  Occupational  History  . Not on file  Tobacco Use  . Smoking status: Former Smoker    Packs/day: 1.00    Years: 20.00    Pack years: 20.00    Types: Cigarettes    Last attempt to quit: 03/03/1980    Years since quitting: 37.1  . Smokeless tobacco: Never Used  Substance and Sexual Activity  . Alcohol use: No    Alcohol/week: 0.0 oz  . Drug use: No  . Sexual activity: Not on file  Other Topics Concern  . Not on file  Social History Narrative  . Not on file   Family History  Problem Relation Age of Onset  . Cancer Mother        unknown- everywhere  . Cancer Sister        breast   ASSESSMENT Recent Results: Lab Results  Component Value Date   INR 2.3 04/15/2017   INR 2.1 04/07/2017   INR 2.2 03/30/2017   PROTIME 15.6 (H) 05/17/2013   Anticoagulation Dosing: Description   Dr. Beryle Beams patient     INR today: Therapeutic  PLAN Weekly dose was unchanged  Patient Instructions  Patient educated about medication as defined in this encounter and verbalized understanding by repeating back instructions provided.    Patient advised to contact clinic or seek medical attention if signs/symptoms of bleeding or thromboembolism occur.  Patient verbalized understanding by repeating back information and was advised to contact me if further medication-related questions arise. Patient was also provided an information handout.  Follow-up Return in about 2 weeks (around 04/29/2017).  [name]

## 2017-04-17 NOTE — Progress Notes (Signed)
Reviewed thx DrG 

## 2017-04-19 NOTE — Assessment & Plan Note (Signed)
No recent flares 

## 2017-04-19 NOTE — Assessment & Plan Note (Addendum)
Has been under a great deal of stress caring for his wife after her surgery for cancer. He has used Alprazolam prn with acceptable results

## 2017-04-19 NOTE — Assessment & Plan Note (Signed)
continue to monitor

## 2017-04-19 NOTE — Assessment & Plan Note (Signed)
Tolerating Coumauin

## 2017-04-19 NOTE — Assessment & Plan Note (Signed)
With some dysuria. Encouraged to hydrate well. Add Zinc 50 mg daily, and consider diagnosis of Interstitial cystitis. Can consider a trial of meds vs a referral if symptoms persist

## 2017-04-19 NOTE — Assessment & Plan Note (Signed)
hgba1c acceptable, minimize simple carbs. Increase exercise as tolerated.  

## 2017-05-05 LAB — POCT INR: INR: 2.5

## 2017-05-06 ENCOUNTER — Encounter: Payer: Self-pay | Admitting: Pharmacist

## 2017-05-06 DIAGNOSIS — Z7901 Long term (current) use of anticoagulants: Secondary | ICD-10-CM

## 2017-05-06 DIAGNOSIS — D6859 Other primary thrombophilia: Secondary | ICD-10-CM

## 2017-05-06 DIAGNOSIS — I2699 Other pulmonary embolism without acute cor pulmonale: Secondary | ICD-10-CM

## 2017-05-06 DIAGNOSIS — I82509 Chronic embolism and thrombosis of unspecified deep veins of unspecified lower extremity: Secondary | ICD-10-CM

## 2017-05-06 DIAGNOSIS — D689 Coagulation defect, unspecified: Secondary | ICD-10-CM

## 2017-05-06 NOTE — Patient Instructions (Signed)
Patient educated about medication as defined in this encounter and verbalized understanding by repeating back instructions provided.   

## 2017-05-06 NOTE — Progress Notes (Signed)
Anticoagulation Management Paul Ryan a 72 y.o.malewho reports to the clinic for monitoring of warfarintreatment.   Indication:History DVT, PE, proteinSdeficiency Duration:indefinite Supervising physician:James Granfortuna  Anticoagulation Clinic Visit History: [name] not report signs/symptoms of bleeding or thromboembolism.  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  ARMOUR THYROID 60 MG tablet TAKE 1 TABLET BY MOUTH AS DIRECTED ALTERNATING WITH THE 90 MG  ARMOUR THYROID 90 MG tablet TAKE 1 TABLET BY MOUTH AS DIRECTED ALTERNATING WITH THE 60 MG  b complex vitamins tablet Take 1 tablet by mouth daily.    cetirizine (ZYRTEC) 10 MG tablet Take 1 tablet (10 mg total) by mouth daily as needed for allergies.  Cholecalciferol 4000 UNITS TABS Take 1 tablet by mouth daily.  Coenzyme Q10 (COQ10 PO) Take 2 tablets by mouth daily.  famotidine (PEPCID) 40 MG tablet Take 1 tablet (40 mg total) by mouth daily as needed for heartburn or indigestion.  Krill Oil 300 MG CAPS 1 cap daily  metFORMIN (GLUCOPHAGE) 500 MG tablet Take 1 tablet (500 mg total) by mouth 2 (two) times daily with a meal.  montelukast (SINGULAIR) 10 MG tablet Take 1 tablet (10 mg total) by mouth at bedtime.  NIACIN PO Take 1 tablet by  mouth 3 (three) times daily.   rosuvastatin (CRESTOR) 40 MG tablet Take 1 tablet (40 mg total) by mouth daily.  sildenafil (REVATIO) 20 MG tablet Take 1-5 tablets (20-100 mg total) by mouth daily as needed.  sulfamethoxazole-trimethoprim (BACTRIM DS,SEPTRA DS) 800-160 MG tablet Take 1 tablet by mouth 2 (two) times daily.  tacrolimus (PROTOPIC) 0.1 % ointment Apply topically 2 (two) times daily.  warfarin (COUMADIN) 4 MG tablet Take 1 tablet (4 mg total) by mouth daily.   Past Medical History:  Diagnosis Date  . Acid reflux 02/03/2016  . Allergy    seasonal  . Anxiety 12/01/2016  . Chronic anticoagulation 06/14/2013  . Coagulopathy ([place]) 05/16/2011  . Collagen vascular disease ([place])   . Cough 12/01/2016  . DVT (deep venous thrombosis) ([name])    takes coumadin  . Dysuria 02/27/2013  . Erectile dysfunction 12/28/2016  . 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 Hyperglycemia 12/26/2014  . Hyperlipidemia   . Hypothyroid 06/10/2011  . Insomnia 04/24/2016  . Left hip pain 12/17/2010  . MCI (mild cognitive impairment) 07/22/2016  . Nasal lesion 07/22/2016  . Polymyalgia ([place]) 03/21/2014  . 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 Preventative health care 12/17/2010  . Shingles 1982   right abdominal wall  . Staph skin infection 07/22/2016  . Thyroid nodule 11/29/2014  . Urinary hesitancy 09/30/2016  . Vitamin D deficiency 10/14/2011   Social History   Socioeconomic History  .  Marital status: Married    Spouse name: Not on file  . Number of children: Not on file  . Years of education: Not on file  . Highest education level: Not on file  Social Needs  . Financial resource strain: Not on file  . Food insecurity - worry: Not on file  . Food insecurity - inability: Not on file  . Transportation needs - medical: Not on file  . Transportation needs - non-medical: Not on file  Occupational History  . Not on file  Tobacco Use  . Smoking status: Former Smoker    Packs/day: 1.00     Years: 20.00    Pack years: 20.00    Types: Cigarettes    Last attempt to quit: 03/03/1980    Years since quitting: 37.2  . Smokeless tobacco: Never Used  Substance and Sexual Activity  . Alcohol use: No    Alcohol/week: 0.0 oz  . Drug use: No  . Sexual activity: Not on file  Other Topics Concern  . Not on file  Social History Narrative  . Not on file   Family History  Problem Relation Age of Onset  . Cancer Mother        unknown- everywhere  . Cancer Sister        breast   ASSESSMENT Recent Results: Lab Results  Component Value Date   INR 2.5 05/05/2017   INR 2.3 04/15/2017   INR 2.1 04/07/2017   PROTIME 15.6 (H) 05/17/2013   Anticoagulation Dosing: Description   Dr. Beryle Beams patient     INR today: Therapeutic  PLAN Weekly dose was unchanged  Patient Instructions  Patient educated about medication as defined in this encounter and verbalized understanding by repeating back instructions provided.   Patient advised to contact clinic or seek medical attention if signs/symptoms of bleeding or thromboembolism occur.  Patient verbalized understanding by repeating back information and was advised to contact me if further medication-related questions arise. Patient was also provided an information handout.  Follow-up Return in about 2 weeks (around 05/20/2017).  [name]

## 2017-05-07 NOTE — Progress Notes (Signed)
Reviewed thx DrG 

## 2017-05-12 LAB — PROTIME-INR

## 2017-05-23 ENCOUNTER — Encounter: Payer: Self-pay | Admitting: Family Medicine

## 2017-05-23 DIAGNOSIS — Z7251 High risk heterosexual behavior: Secondary | ICD-10-CM

## 2017-05-23 DIAGNOSIS — R300 Dysuria: Secondary | ICD-10-CM

## 2017-05-25 ENCOUNTER — Other Ambulatory Visit: Payer: Self-pay | Admitting: Family Medicine

## 2017-05-25 DIAGNOSIS — N489 Disorder of penis, unspecified: Secondary | ICD-10-CM

## 2017-05-25 DIAGNOSIS — R300 Dysuria: Secondary | ICD-10-CM

## 2017-05-26 ENCOUNTER — Other Ambulatory Visit: Payer: Self-pay | Admitting: Family Medicine

## 2017-05-27 ENCOUNTER — Encounter: Payer: Self-pay | Admitting: Pharmacist

## 2017-05-27 DIAGNOSIS — I2699 Other pulmonary embolism without acute cor pulmonale: Secondary | ICD-10-CM

## 2017-05-27 DIAGNOSIS — I82509 Chronic embolism and thrombosis of unspecified deep veins of unspecified lower extremity: Secondary | ICD-10-CM

## 2017-05-27 DIAGNOSIS — D689 Coagulation defect, unspecified: Secondary | ICD-10-CM

## 2017-05-27 DIAGNOSIS — D6859 Other primary thrombophilia: Secondary | ICD-10-CM

## 2017-05-27 DIAGNOSIS — Z7901 Long term (current) use of anticoagulants: Secondary | ICD-10-CM

## 2017-05-27 LAB — POCT INR: INR: 3.3

## 2017-05-27 NOTE — Progress Notes (Signed)
Reviewed thx DrG 

## 2017-05-27 NOTE — Patient Instructions (Signed)
Patient educated about medication as defined in this encounter and verbalized understanding by repeating back instructions provided.   

## 2017-05-27 NOTE — Progress Notes (Signed)
Anticoagulation Management Paul Ryan a 72 y.o.malewho reports to the clinic for monitoring of warfarintreatment.   Indication:History DVT, PE, proteinSdeficiency Duration:indefinite Supervising physician:James Granfortuna  Anticoagulation Clinic Visit History: [name] not report signs/symptoms of bleeding or thromboembolism.Patient reports taking cranberry for UTI.  Anticoagulation Episode Summary    Current INR goal:   2.0-3.0  TTR:   71.7 % (2.8 y)  Next INR check:   06/03/2017  INR from last check:   3.3! 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  ARMOUR THYROID 60 MG tablet TAKE 1 TABLET BY MOUTH AS DIRECTED ALTERNATING WITH THE 90 MG  ARMOUR THYROID 90 MG tablet TAKE 1 TABLET BY MOUTH AS DIRECTED ALTERNATING WITH THE 60 MG  b complex vitamins tablet Take 1 tablet by mouth daily.    cetirizine (ZYRTEC) 10 MG tablet TAKE 1 TABLET BY MOUTH EVERY DAY AS NEEDED FOR ALLERGIES  Cholecalciferol 4000 UNITS TABS Take 1 tablet by mouth daily.  Coenzyme Q10 (COQ10 PO) Take 2 tablets by mouth daily.  famotidine (PEPCID) 40 MG tablet Take 1 tablet (40 mg total) by mouth daily as needed for heartburn or indigestion.  Krill Oil 300 MG CAPS 1 cap daily  metFORMIN (GLUCOPHAGE) 500 MG tablet Take 1 tablet (500 mg total) by mouth 2 (two) times daily with a meal.  montelukast (SINGULAIR) 10 MG tablet Take 1 tablet (10 mg total) by mouth at  bedtime.  NIACIN PO Take 1 tablet by mouth 3 (three) times daily.   rosuvastatin (CRESTOR) 40 MG tablet Take 1 tablet (40 mg total) by mouth daily.  sildenafil (REVATIO) 20 MG tablet Take 1-5 tablets (20-100 mg total) by mouth daily as needed.  sulfamethoxazole-trimethoprim (BACTRIM DS,SEPTRA DS) 800-160 MG tablet Take 1 tablet by mouth 2 (two) times daily.  tacrolimus (PROTOPIC) 0.1 % ointment Apply topically 2 (two) times daily.  warfarin (COUMADIN) 4 MG tablet Take 1 tablet (4 mg total) by mouth daily.   Past Medical History:  Diagnosis Date  . Acid reflux 02/03/2016  . Allergy    seasonal  . Anxiety 12/01/2016  . Chronic anticoagulation 06/14/2013  . Coagulopathy ([place]) 05/16/2011  . Collagen vascular disease ([place])   . Cough 12/01/2016  . DVT (deep venous thrombosis) ([name])    takes coumadin  . Dysuria 02/27/2013  . Erectile dysfunction 12/28/2016  . 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 Hyperglycemia 12/26/2014  . Hyperlipidemia   . Hypothyroid 06/10/2011  . Insomnia 04/24/2016  . Left hip pain 12/17/2010  . MCI (mild cognitive impairment) 07/22/2016  . Nasal lesion 07/22/2016  . Polymyalgia ([place]) 03/21/2014  . 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 Preventative health care 12/17/2010  . Shingles 1982   right abdominal wall  . Staph skin infection 07/22/2016  . Thyroid nodule 11/29/2014  . Urinary hesitancy 09/30/2016  . Vitamin D deficiency 10/14/2011   Social History  Socioeconomic History  . Marital status: Married    Spouse name: Not on file  . Number of children: Not on file  . Years of education: Not on file  . Highest education level: Not on file  Occupational History  . Not on file  Social Needs  . Financial resource strain: Not on file  . Food insecurity:    Worry: Not on file    Inability: Not on file  . Transportation needs:    Medical: Not on file    Non-medical: Not on file  Tobacco Use  . Smoking status: Former Smoker    Packs/day:  1.00    Years: 20.00    Pack years: 20.00    Types: Cigarettes    Last attempt to quit: 03/03/1980    Years since quitting: 37.2  . Smokeless tobacco: Never Used  Substance and Sexual Activity  . Alcohol use: No    Alcohol/week: 0.0 oz  . Drug use: No  . Sexual activity: Not on file  Lifestyle  . Physical activity:    Days per week: Not on file    Minutes per session: Not on file  . Stress: Not on file  Relationships  . Social connections:    Talks on phone: Not on file    Gets together: Not on file    Attends religious service: Not on file    Active member of club or organization: Not on file    Attends meetings of clubs or organizations: Not on file    Relationship status: Not on file  Other Topics Concern  . Not on file  Social History Narrative  . Not on file   Family History  Problem Relation Age of Onset  . Cancer Mother        unknown- everywhere  . Cancer Sister        breast   ASSESSMENT Recent Results: Lab Results  Component Value Date   INR 3.3 05/27/2017   INR 2.5 05/05/2017   INR 2.3 04/15/2017   PROTIME 15.6 (H) 05/17/2013   Anticoagulation Dosing: Description   Dr. Beryle Beams patient     INR today: Supratherapeutic  PLAN Weekly dose was decreased by 14%, education provided on interaction with cranberry, re-check INR in 1 week.  Patient Instructions  Patient educated about medication as defined in this encounter and verbalized understanding by repeating back instructions provided.    Patient advised to contact clinic or seek medical attention if signs/symptoms of bleeding or thromboembolism occur.  Patient verbalized understanding by repeating back information and was advised to contact me if further medication-related questions arise. Patient was also provided an information handout.  Follow-up Return in about 1 week (around 06/03/2017).  [name]

## 2017-05-28 LAB — PROTIME-INR

## 2017-06-04 ENCOUNTER — Encounter: Payer: Self-pay | Admitting: Pharmacist

## 2017-06-04 DIAGNOSIS — Z7901 Long term (current) use of anticoagulants: Secondary | ICD-10-CM

## 2017-06-04 DIAGNOSIS — I82509 Chronic embolism and thrombosis of unspecified deep veins of unspecified lower extremity: Secondary | ICD-10-CM

## 2017-06-04 DIAGNOSIS — D6859 Other primary thrombophilia: Secondary | ICD-10-CM

## 2017-06-04 DIAGNOSIS — I2699 Other pulmonary embolism without acute cor pulmonale: Secondary | ICD-10-CM

## 2017-06-04 DIAGNOSIS — D689 Coagulation defect, unspecified: Secondary | ICD-10-CM

## 2017-06-04 LAB — POCT INR: INR: 2.2

## 2017-06-04 NOTE — Patient Instructions (Signed)
Patient educated about medication as defined in this encounter and verbalized understanding by repeating back instructions provided.   

## 2017-06-04 NOTE — Progress Notes (Signed)
Anticoagulation Management Paul Ryan a 72 y.o.malewho reports to the clinic for monitoring of warfarintreatment.   Indication:History DVT, PE, proteinSdeficiency Duration:indefinite Supervising physician:James Granfortuna  Anticoagulation Clinic Visit History: [name] not report signs/symptoms of bleeding or thromboembolism.Patient reports taking cranberry for UTI.  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  ARMOUR THYROID 60 MG tablet TAKE 1 TABLET BY MOUTH AS DIRECTED ALTERNATING WITH THE 90 MG  ARMOUR THYROID 90 MG tablet TAKE 1 TABLET BY MOUTH AS DIRECTED ALTERNATING WITH THE 60 MG  b complex vitamins tablet Take 1 tablet by mouth daily.    cetirizine (ZYRTEC) 10 MG tablet TAKE 1 TABLET BY MOUTH EVERY DAY AS NEEDED FOR ALLERGIES  Cholecalciferol 4000 UNITS TABS Take 1 tablet by mouth daily.  Coenzyme Q10 (COQ10 PO) Take 2 tablets by mouth daily.  famotidine (PEPCID) 40 MG tablet Take 1 tablet (40 mg total) by mouth daily as needed for heartburn or indigestion.  Krill Oil 300 MG CAPS 1 cap daily  metFORMIN (GLUCOPHAGE) 500 MG tablet Take 1 tablet (500 mg total) by mouth 2 (two) times daily with a meal.  montelukast (SINGULAIR) 10 MG tablet Take 1 tablet (10 mg total) by mouth at  bedtime.  NIACIN PO Take 1 tablet by mouth 3 (three) times daily.   rosuvastatin (CRESTOR) 40 MG tablet Take 1 tablet (40 mg total) by mouth daily.  sildenafil (REVATIO) 20 MG tablet Take 1-5 tablets (20-100 mg total) by mouth daily as needed.  sulfamethoxazole-trimethoprim (BACTRIM DS,SEPTRA DS) 800-160 MG tablet Take 1 tablet by mouth 2 (two) times daily.  tacrolimus (PROTOPIC) 0.1 % ointment Apply topically 2 (two) times daily.  warfarin (COUMADIN) 4 MG tablet Take 1 tablet (4 mg total) by mouth daily.   Past Medical History:  Diagnosis Date  . Acid reflux 02/03/2016  . Allergy    seasonal  . Anxiety 12/01/2016  . Chronic anticoagulation 06/14/2013  . Coagulopathy ([place]) 05/16/2011  . Collagen vascular disease ([place])   . Cough 12/01/2016  . DVT (deep venous thrombosis) ([name])    takes coumadin  . Dysuria 02/27/2013  . Erectile dysfunction 12/28/2016  . 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 Hyperglycemia 12/26/2014  . Hyperlipidemia   . Hypothyroid 06/10/2011  . Insomnia 04/24/2016  . Left hip pain 12/17/2010  . MCI (mild cognitive impairment) 07/22/2016  . Nasal lesion 07/22/2016  . Polymyalgia ([place]) 03/21/2014  . 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 Preventative health care 12/17/2010  . Shingles 1982   right abdominal wall  . Staph skin infection 07/22/2016  . Thyroid nodule 11/29/2014  . Urinary hesitancy 09/30/2016  . Vitamin D deficiency 10/14/2011   Social History  Socioeconomic History  . Marital status: Married    Spouse name: Not on file  . Number of children: Not on file  . Years of education: Not on file  . Highest education level: Not on file  Occupational History  . Not on file  Social Needs  . Financial resource strain: Not on file  . Food insecurity:    Worry: Not on file    Inability: Not on file  . Transportation needs:    Medical: Not on file    Non-medical: Not on file  Tobacco Use  . Smoking status: Former Smoker    Packs/day:  1.00    Years: 20.00    Pack years: 20.00    Types: Cigarettes    Last attempt to quit: 03/03/1980    Years since quitting: 37.2  . Smokeless tobacco: Never Used  Substance and Sexual Activity  . Alcohol use: No    Alcohol/week: 0.0 oz  . Drug use: No  . Sexual activity: Not on file  Lifestyle  . Physical activity:    Days per week: Not on file    Minutes per session: Not on file  . Stress: Not on file  Relationships  . Social connections:    Talks on phone: Not on file    Gets together: Not on file    Attends religious service: Not on file    Active member of club or organization: Not on file    Attends meetings of clubs or organizations: Not on file    Relationship status: Not on file  Other Topics Concern  . Not on file  Social History Narrative  . Not on file   Family History  Problem Relation Age of Onset  . Cancer Mother        unknown- everywhere  . Cancer Sister        breast   ASSESSMENT Recent Results: Lab Results  Component Value Date   INR 2.2 06/04/2017   INR 3.3 05/27/2017   INR 2.5 05/05/2017   PROTIME 15.6 (H) 05/17/2013   Anticoagulation Dosing: Description   Dr. Beryle Beams patient     INR today: Therapeutic  PLAN Weekly dose was unchanged  Patient Instructions  Patient educated about medication as defined in this encounter and verbalized understanding by repeating back instructions provided.   Patient advised to contact clinic or seek medical attention if signs/symptoms of bleeding or thromboembolism occur.  Patient verbalized understanding by repeating back information and was advised to contact me if further medication-related questions arise. Patient was also provided an information handout.  Follow-up Return in about 2 weeks (around 06/18/2017).  [name]

## 2017-06-05 ENCOUNTER — Other Ambulatory Visit (INDEPENDENT_AMBULATORY_CARE_PROVIDER_SITE_OTHER): Payer: Medicare Other

## 2017-06-05 ENCOUNTER — Other Ambulatory Visit (HOSPITAL_COMMUNITY)
Admission: RE | Admit: 2017-06-05 | Discharge: 2017-06-05 | Disposition: A | Discharge status: Home / Self Care | Payer: Medicare Other | Source: Ambulatory Visit | Attending: Family Medicine | Admitting: Family Medicine

## 2017-06-05 DIAGNOSIS — R300 Dysuria: Secondary | ICD-10-CM | POA: Insufficient documentation

## 2017-06-05 DIAGNOSIS — Z7251 High risk heterosexual behavior: Secondary | ICD-10-CM | POA: Diagnosis not present

## 2017-06-05 DIAGNOSIS — A599 Trichomoniasis, unspecified: Secondary | ICD-10-CM | POA: Diagnosis not present

## 2017-06-05 LAB — PROTIME-INR

## 2017-06-05 NOTE — Telephone Encounter (Signed)
done

## 2017-06-05 NOTE — Telephone Encounter (Signed)
Test ordered patient notified via my chart

## 2017-06-08 LAB — HIV ANTIBODY (ROUTINE TESTING W REFLEX): HIV 1&2 Ab, 4th Generation: NONREACTIVE

## 2017-06-08 LAB — RPR: RPR Ser Ql: NONREACTIVE

## 2017-06-09 ENCOUNTER — Encounter: Payer: Self-pay | Admitting: Family Medicine

## 2017-06-09 LAB — URINE CYTOLOGY ANCILLARY ONLY
CHLAMYDIA, DNA PROBE: NEGATIVE
Neisseria Gonorrhea: NEGATIVE
TRICH (WINDOWPATH): POSITIVE — AB

## 2017-06-10 LAB — URINE CYTOLOGY ANCILLARY ONLY
BACTERIAL VAGINITIS: NEGATIVE
Candida vaginitis: NEGATIVE

## 2017-06-10 MED ORDER — METRONIDAZOLE 500 MG PO TABS: 500.0000 mg | ORAL_TABLET | Freq: Two times a day (BID) | ORAL | 0 refills | Status: DC

## 2017-06-10 NOTE — Addendum Note (Signed)
Addended by: Bartholome Bill on: 06/10/2017 11:32 AM   Modules accepted: Orders

## 2017-06-16 ENCOUNTER — Ambulatory Visit: Payer: Medicare Other | Admitting: Family Medicine

## 2017-06-16 VITALS — BP 98/58 | HR 59 | Temp 97.6°F | Resp 18 | Wt 162.4 lb

## 2017-06-16 DIAGNOSIS — M79671 Pain in right foot: Secondary | ICD-10-CM | POA: Insufficient documentation

## 2017-06-16 DIAGNOSIS — E039 Hypothyroidism, unspecified: Secondary | ICD-10-CM | POA: Diagnosis not present

## 2017-06-16 DIAGNOSIS — E785 Hyperlipidemia, unspecified: Secondary | ICD-10-CM

## 2017-06-16 DIAGNOSIS — M353 Polymyalgia rheumatica: Secondary | ICD-10-CM

## 2017-06-16 DIAGNOSIS — R739 Hyperglycemia, unspecified: Secondary | ICD-10-CM

## 2017-06-16 DIAGNOSIS — G3184 Mild cognitive impairment, so stated: Secondary | ICD-10-CM | POA: Diagnosis not present

## 2017-06-16 DIAGNOSIS — R413 Other amnesia: Secondary | ICD-10-CM | POA: Diagnosis not present

## 2017-06-16 DIAGNOSIS — E559 Vitamin D deficiency, unspecified: Secondary | ICD-10-CM | POA: Diagnosis not present

## 2017-06-16 DIAGNOSIS — N342 Other urethritis: Secondary | ICD-10-CM | POA: Diagnosis not present

## 2017-06-16 LAB — COMPREHENSIVE METABOLIC PANEL
ALK PHOS: 44 U/L (ref 39–117)
ALT: 31 U/L (ref 0–53)
AST: 28 U/L (ref 0–37)
Albumin: 4.2 g/dL (ref 3.5–5.2)
BUN: 16 mg/dL (ref 6–23)
CALCIUM: 9.5 mg/dL (ref 8.4–10.5)
CO2: 28 mEq/L (ref 19–32)
Chloride: 106 mEq/L (ref 96–112)
Creatinine, Ser: 1.05 mg/dL (ref 0.40–1.50)
GFR: 73.63 mL/min (ref >60.00)
GLUCOSE: 95 mg/dL (ref 70–99)
POTASSIUM: 4.8 meq/L (ref 3.5–5.1)
Sodium: 140 mEq/L (ref 135–145)
TOTAL PROTEIN: 7.3 g/dL (ref 6.0–8.3)
Total Bilirubin: 0.6 mg/dL (ref 0.2–1.2)

## 2017-06-16 LAB — CBC
HEMATOCRIT: 47.7 % (ref 39.0–52.0)
HEMOGLOBIN: 15.7 g/dL (ref 13.0–17.0)
MCHC: 32.9 g/dL (ref 30.0–36.0)
MCV: 91.5 fl (ref 78.0–100.0)
PLATELETS: 206 10*3/uL (ref 150.0–400.0)
RBC: 5.21 Mil/uL (ref 4.22–5.81)
RDW: 13.9 % (ref 11.5–15.5)
WBC: 6 10*3/uL (ref 4.0–10.5)

## 2017-06-16 LAB — LIPID PANEL
Cholesterol: 212 mg/dL — ABNORMAL HIGH (ref 0–200)
HDL: 43.3 mg/dL (ref >39.00)
LDL Cholesterol: 148 mg/dL — ABNORMAL HIGH (ref 0–99)
NonHDL: 168.47
TRIGLYCERIDES: 103 mg/dL (ref 0.0–149.0)
Total CHOL/HDL Ratio: 5
VLDL: 20.6 mg/dL (ref 0.0–40.0)

## 2017-06-16 LAB — TSH: TSH: 0.12 u[IU]/mL — ABNORMAL LOW (ref 0.35–4.50)

## 2017-06-16 LAB — VITAMIN D 25 HYDROXY (VIT D DEFICIENCY, FRACTURES): VITD: 81.46 ng/mL (ref 30.00–100.00)

## 2017-06-16 LAB — HEMOGLOBIN A1C: HEMOGLOBIN A1C: 6.1 % (ref 4.6–6.5)

## 2017-06-16 MED ORDER — MONTELUKAST SODIUM 10 MG PO TABS: 10.0000 mg | ORAL_TABLET | Freq: Every day | ORAL | 3 refills | Status: DC

## 2017-06-16 NOTE — Patient Instructions (Signed)
Foot Pain Many things can cause foot pain. Some common causes are:  An injury.  A sprain.  Arthritis.  Blisters.  Bunions.  Follow these instructions at home: Pay attention to any changes in your symptoms. Take these actions to help with your discomfort:  If directed, put ice on the affected area: ? Put ice in a plastic bag. ? Place a towel between your skin and the bag. ? Leave the ice on for 15-20 minutes, 3?4 times a day for 2 days.  Take over-the-counter and prescription medicines only as told by your health care provider.  Wear comfortable, supportive shoes that fit you well. Do not wear high heels.  Do not stand or walk for long periods of time.  Do not lift a lot of weight. This can put added pressure on your feet.  Do stretches to relieve foot pain and stiffness as told by your health care provider.  Rub your foot gently.  Keep your feet clean and dry.  Contact a health care provider if:  Your pain does not get better after a few days of self-care.  Your pain gets worse.  You cannot stand on your foot. Get help right away if:  Your foot is numb or tingling.  Your foot or toes are swollen.  Your foot or toes turn white or blue.  You have warmth and redness along your foot. This information is not intended to replace advice given to you by your health care provider. Make sure you discuss any questions you have with your health care provider. Document Released: 03/16/2015 Document Revised: 07/26/2015 Document Reviewed: 03/15/2014 Elsevier Interactive Patient Education  2018 [employer]  

## 2017-06-16 NOTE — Assessment & Plan Note (Signed)
Check level and continue daily supplements, takes 5000 IU

## 2017-06-16 NOTE — Assessment & Plan Note (Signed)
Worsening over several months. Referred to podiatry Dr Earleen [place]

## 2017-06-16 NOTE — Progress Notes (Signed)
Subjective:  I acted as a Education administrator for Dr. Charlett Blake. [place], [place]  Patient ID: Paul Ryan, male    DOB: Jul 17, 1945, 72 y.o.   MRN: [id number]  No chief complaint on file.   HPI  Patient is in today for a follow up and he is noting an increase in right foot pain but denies any injury. Pain is worsening, no redness or warmth. His dysuria is improving with treatment with Flagyl. No recent febrile illness or hospitalizaitons. Denies CP/palp/SOB/HA/congestion/fevers/GI or GU c/o. Taking meds as prescribed. He continues to be worried about progressive memory loss over past couple of years. No diagnosis has ben identified.   Patient Care Team: [name], MD as PCP - General (Family Medicine) Roena Malady, MD (Ophthalmology) Forde Dandy, PharmD as Pharmacist (Pharmacist)   Past Medical History:  Diagnosis Date  . Acid reflux 02/03/2016  . Allergy    seasonal  . Anxiety 12/01/2016  . Chronic anticoagulation 06/14/2013  . Coagulopathy ([place]) 05/16/2011  . Collagen vascular disease ([place])   . Cough 12/01/2016  . DVT (deep venous thrombosis) ([name])    takes coumadin  . Dysuria 02/27/2013  . Erectile dysfunction 12/28/2016  . 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 Hyperglycemia 12/26/2014  . Hyperlipidemia   . Hypothyroid 06/10/2011  . Insomnia 04/24/2016  . Left hip pain 12/17/2010  . MCI (mild cognitive impairment) 07/22/2016  . Nasal lesion 07/22/2016  . Polymyalgia ([place]) 03/21/2014  . 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 Preventative health care 12/17/2010  . Shingles 1982   right abdominal wall  . Staph skin infection 07/22/2016  . Thyroid nodule 11/29/2014  . Urinary hesitancy 09/30/2016  . Vitamin D deficiency 10/14/2011    Past Surgical History:  Procedure Laterality Date  . 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 Cancer Mother        unknown- everywhere  . Cancer Sister     breast    Social History   Socioeconomic History  . Marital status: Married    Spouse name: Not on file  . Number of children: Not on file  . Years of education: Not on file  . Highest education level: Not on file  Occupational History  . Not on file  Social Needs  . Financial resource strain: Not on file  . Food insecurity:    Worry: Not on file    Inability: Not on file  . Transportation needs:    Medical: Not on file    Non-medical: Not on file  Tobacco Use  . Smoking status: Former Smoker    Packs/day: 1.00    Years: 20.00    Pack years: 20.00    Types: Cigarettes    Last attempt to quit: 03/03/1980    Years since quitting: 37.3  . Smokeless tobacco: Never Used  Substance and Sexual Activity  . Alcohol use: No    Alcohol/week: 0.0 oz  . Drug use: No  . Sexual activity: Not on file  Lifestyle  . Physical activity:    Days per week: Not on file    Minutes per session: Not on file  . Stress: Not on file  Relationships  . Social connections:    Talks on phone: Not on file    Gets together: Not on file    Attends religious service: Not on file    Active member of  club or organization: Not on file    Attends meetings of clubs or organizations: Not on file    Relationship status: Not on file  . Intimate partner violence:    Fear of current or ex partner: Not on file    Emotionally abused: Not on file    Physically abused: Not on file    Forced sexual activity: Not on file  Other Topics Concern  . Not on file  Social History Narrative  . Not on file    Outpatient Medications Prior to Visit  Medication Sig Dispense Refill  . albuterol (PROVENTIL HFA;VENTOLIN HFA) 108 (90 Base) MCG/ACT inhaler Inhale 2 puffs into the lungs every 4 (four) hours as needed for wheezing or shortness of breath. 1 Inhaler 3  . b complex vitamins tablet Take 1 tablet by mouth daily.      . cetirizine (ZYRTEC) 10 MG tablet TAKE 1 TABLET BY MOUTH EVERY DAY AS NEEDED FOR ALLERGIES 30  tablet 4  . Cholecalciferol 4000 UNITS TABS Take 1 tablet by mouth daily.    . Coenzyme Q10 (COQ10 PO) Take 2 tablets by mouth daily.    . famotidine (PEPCID) 40 MG tablet Take 1 tablet (40 mg total) by mouth daily as needed for heartburn or indigestion. 30 tablet 5  . Krill Oil 300 MG CAPS 1 cap daily    . metFORMIN (GLUCOPHAGE) 500 MG tablet Take 1 tablet (500 mg total) by mouth 2 (two) times daily with a meal. 30 tablet 3  . metroNIDAZOLE (FLAGYL) 500 MG tablet Take 1 tablet (500 mg total) by mouth 2 (two) times daily. For 7 days. 14 tablet 0  . NIACIN PO Take 1 tablet by mouth 3 (three) times daily.     . rosuvastatin (CRESTOR) 40 MG tablet Take 1 tablet (40 mg total) by mouth daily. 90 tablet 1  . sildenafil (REVATIO) 20 MG tablet Take 1-5 tablets (20-100 mg total) by mouth daily as needed. 35 tablet 2  . sulfamethoxazole-trimethoprim (BACTRIM DS,SEPTRA DS) 800-160 MG tablet Take 1 tablet by mouth 2 (two) times daily. 28 tablet 0  . tacrolimus (PROTOPIC) 0.1 % ointment Apply topically 2 (two) times daily. 100 g 1  . warfarin (COUMADIN) 4 MG tablet Take 1 tablet (4 mg total) by mouth daily. 90 tablet 3  . ARMOUR THYROID 60 MG tablet TAKE 1 TABLET BY MOUTH AS DIRECTED ALTERNATING WITH THE 90 MG 45 tablet 2  . ARMOUR THYROID 90 MG tablet TAKE 1 TABLET BY MOUTH AS DIRECTED ALTERNATING WITH THE 60 MG 45 tablet 2  . montelukast (SINGULAIR) 10 MG tablet Take 1 tablet (10 mg total) by mouth at bedtime. 30 tablet 3   No facility-administered medications prior to visit.     No Known Allergies  Review of Systems  Constitutional: Negative for fever and malaise/fatigue.  HENT: Negative for congestion.   Eyes: Negative for blurred vision.  Respiratory: Negative for shortness of breath.   Cardiovascular: Negative for chest pain, palpitations and leg swelling.  Gastrointestinal: Negative for abdominal pain, blood in stool and nausea.  Genitourinary: Negative for dysuria and frequency.    Musculoskeletal: Positive for joint pain. Negative for falls.  Skin: Negative for rash.  Neurological: Negative for dizziness, loss of consciousness and headaches.  Endo/Heme/Allergies: Negative for environmental allergies.  Psychiatric/Behavioral: Positive for memory loss. Negative for depression. The patient is not nervous/anxious.        Objective:    Physical Exam  Constitutional: He is oriented to person,  place, and time. He appears well-developed and well-nourished. No distress.  HENT:  Head: Normocephalic and atraumatic.  Nose: Nose normal.  Eyes: Right eye exhibits no discharge. Left eye exhibits no discharge.  Neck: Normal range of motion. Neck supple.  Cardiovascular: Normal rate and regular rhythm.  No murmur heard. Pulmonary/Chest: Effort normal and breath sounds normal.  Abdominal: Soft. Bowel sounds are normal. There is no tenderness.  Musculoskeletal: He exhibits no edema.  Neurological: He is alert and oriented to person, place, and time.  Skin: Skin is warm and dry.  Psychiatric: He has a normal mood and affect.  Nursing note and vitals reviewed.   BP (!) 98/58 (BP Location: Left Arm, Patient Position: Sitting, Cuff Size: Normal)   Pulse (!) 59   Temp 97.6 F (36.4 C) (Oral)   Resp 18   Wt 162 lb 6.4 oz (73.7 kg)   SpO2 98%   BMI 22.03 kg/m  Wt Readings from Last 3 Encounters:  06/16/17 162 lb 6.4 oz (73.7 kg)  04/16/17 166 lb (75.3 kg)  12/25/16 159 lb 6.4 oz (72.3 kg)   BP Readings from Last 3 Encounters:  06/16/17 (!) 98/58  04/16/17 100/60  12/25/16 112/62     Immunization History  Administered Date(s) Administered  . Hepatitis A 11/09/1998  . IPV 11/09/1998  . Influenza Split 11/22/2015  . Influenza Whole 12/13/2012  . Influenza, High Dose Seasonal PF 10/31/2016  . Influenza,inj,Quad PF,6+ Mos 10/25/2013, 12/26/2014  . Influenza-Unspecified 12/02/2011  . PPD Test 03/23/2015  . Pneumococcal Conjugate-13 10/25/2013  . Pneumococcal  Polysaccharide-23 06/19/2009, 12/26/2014  . Td 03/03/1990, 07/01/2001  . Tdap 12/17/2010  . Typhoid Inactivated 11/09/1998, 03/09/2001  . Zoster 08/01/2008    Health Maintenance  Topic Date Due  . FOOT EXAM  08/24/1954  . OPHTHALMOLOGY EXAM  08/24/1954  . URINE MICROALBUMIN  08/24/1954  . INFLUENZA VACCINE  10/01/2017  . HEMOGLOBIN A1C  12/16/2017  . TETANUS/TDAP  12/16/2020  . COLONOSCOPY  06/20/2026  . Hepatitis C Screening  Completed  . PNA vac Low Risk Adult  Completed    Lab Results  Component Value Date   WBC 6.0 06/16/2017   HGB 15.7 06/16/2017   HCT 47.7 06/16/2017   PLT 206.0 06/16/2017   GLUCOSE 95 06/16/2017   CHOL 212 (H) 06/16/2017   TRIG 103.0 06/16/2017   HDL 43.30 06/16/2017   LDLDIRECT 158.0 03/11/2013   LDLCALC 148 (H) 06/16/2017   ALT 31 06/16/2017   AST 28 06/16/2017   NA 140 06/16/2017   K 4.8 06/16/2017   CL 106 06/16/2017   CREATININE 1.05 06/16/2017   BUN 16 06/16/2017   CO2 28 06/16/2017   TSH 0.12 (L) 06/16/2017   PSA 1.53 07/18/2016   INR 2.2 06/04/2017   HGBA1C 6.1 06/16/2017    Lab Results  Component Value Date   TSH 0.12 (L) 06/16/2017   Lab Results  Component Value Date   WBC 6.0 06/16/2017   HGB 15.7 06/16/2017   HCT 47.7 06/16/2017   MCV 91.5 06/16/2017   PLT 206.0 06/16/2017   Lab Results  Component Value Date   NA 140 06/16/2017   K 4.8 06/16/2017   CHLORIDE 109 11/16/2012   CO2 28 06/16/2017   GLUCOSE 95 06/16/2017   BUN 16 06/16/2017   CREATININE 1.05 06/16/2017   BILITOT 0.6 06/16/2017   ALKPHOS 44 06/16/2017   AST 28 06/16/2017   ALT 31 06/16/2017   PROT 7.3 06/16/2017   ALBUMIN 4.2 06/16/2017  CALCIUM 9.5 06/16/2017   ANIONGAP 8 09/11/2014   GFR 73.63 06/16/2017   Lab Results  Component Value Date   CHOL 212 (H) 06/16/2017   Lab Results  Component Value Date   HDL 43.30 06/16/2017   Lab Results  Component Value Date   LDLCALC 148 (H) 06/16/2017   Lab Results  Component Value Date   TRIG  103.0 06/16/2017   Lab Results  Component Value Date   CHOLHDL 5 06/16/2017   Lab Results  Component Value Date   HGBA1C 6.1 06/16/2017         Assessment & Plan:   Problem List Items Addressed This Visit    Hyperlipidemia    Encouraged heart healthy diet, increase exercise, avoid trans fats, consider a krill oil cap daily. Tolerates       Relevant Orders   Lipid panel (Completed)   Hypothyroid    On Armour Thyroid, continue to monitor       Relevant Orders   TSH (Completed)   Vitamin D deficiency    Check level and continue daily supplements, takes 5000 IU       Relevant Orders   Comprehensive metabolic panel (Completed)   VITAMIN D 25 Hydroxy (Vit-D Deficiency, Fractures) (Completed)   Polymyalgia rheumatica ([place])    Doing well at current time.      Relevant Orders   CBC (Completed)   Hyperglycemia    hgba1c acceptable, minimize simple carbs. Increase exercise as tolerated.       Relevant Orders   Hemoglobin A1c (Completed)   MCI (mild cognitive impairment)    Patient has been noting worsening memory impairment over the past 5 years.       Foot pain, right    Worsening over several months. Referred to podiatry Dr Earleen Newport      Relevant Orders   Ambulatory referral to Podiatry   Urethritis    Trichomanas treated and he is feeling better.       Memory loss - Primary   Relevant Orders   Ambulatory referral to Neurology      I am having Unknown Foley maintain his b complex vitamins, Krill Oil, tacrolimus, NIACIN PO, Cholecalciferol, Coenzyme Q10 (COQ10 PO), albuterol, rosuvastatin, warfarin, famotidine, sulfamethoxazole-trimethoprim, sildenafil, metFORMIN, cetirizine, metroNIDAZOLE, and montelukast.  Meds ordered this encounter  Medications  . montelukast (SINGULAIR) 10 MG tablet    Sig: Take 1 tablet (10 mg total) by mouth at bedtime.    Dispense:  90 tablet    Refill:  3    CMA served as scribe during this visit. History, Physical and Plan  performed by medical provider. Documentation and orders reviewed and attested to.  Penni Homans, MD

## 2017-06-16 NOTE — Assessment & Plan Note (Addendum)
On Armour Thyroid, continue to monitor  

## 2017-06-16 NOTE — Assessment & Plan Note (Addendum)
Doing well at current time 

## 2017-06-16 NOTE — Assessment & Plan Note (Signed)
hgba1c acceptable, minimize simple carbs. Increase exercise as tolerated.  

## 2017-06-16 NOTE — Assessment & Plan Note (Signed)
Encouraged heart healthy diet, increase exercise, avoid trans fats, consider a krill oil cap daily. Tolerates

## 2017-06-16 NOTE — Assessment & Plan Note (Signed)
Patient has been noting worsening memory impairment over the past 5 years.

## 2017-06-17 ENCOUNTER — Encounter: Payer: Self-pay | Admitting: Family Medicine

## 2017-06-18 ENCOUNTER — Other Ambulatory Visit: Payer: Self-pay | Admitting: Family Medicine

## 2017-06-18 MED ORDER — THYROID 90 MG PO TABS: ORAL_TABLET | ORAL | 1 refills | Status: DC

## 2017-06-18 MED ORDER — THYROID 60 MG PO TABS: ORAL_TABLET | ORAL | 1 refills | Status: DC

## 2017-06-22 ENCOUNTER — Encounter: Payer: Self-pay | Admitting: Neurology

## 2017-06-22 ENCOUNTER — Encounter: Payer: Self-pay | Admitting: Family Medicine

## 2017-06-22 DIAGNOSIS — R413 Other amnesia: Secondary | ICD-10-CM | POA: Insufficient documentation

## 2017-06-22 DIAGNOSIS — N342 Other urethritis: Secondary | ICD-10-CM | POA: Insufficient documentation

## 2017-06-22 NOTE — Assessment & Plan Note (Signed)
Trichomanas treated and he is feeling better.

## 2017-06-25 ENCOUNTER — Encounter: Payer: Self-pay | Admitting: Pharmacist

## 2017-06-25 DIAGNOSIS — Z7901 Long term (current) use of anticoagulants: Secondary | ICD-10-CM

## 2017-06-25 DIAGNOSIS — I82509 Chronic embolism and thrombosis of unspecified deep veins of unspecified lower extremity: Secondary | ICD-10-CM

## 2017-06-25 DIAGNOSIS — D6859 Other primary thrombophilia: Secondary | ICD-10-CM

## 2017-06-25 DIAGNOSIS — I2699 Other pulmonary embolism without acute cor pulmonale: Secondary | ICD-10-CM

## 2017-06-25 DIAGNOSIS — D689 Coagulation defect, unspecified: Secondary | ICD-10-CM

## 2017-06-25 LAB — POCT INR: INR: 3.7

## 2017-06-25 NOTE — Patient Instructions (Signed)
Patient educated about medication as defined in this encounter and verbalized understanding by repeating back instructions provided.   

## 2017-06-25 NOTE — Progress Notes (Signed)
Anticoagulation Management Paul Ryan a 72 y.o.malewho reports to the clinic for monitoring of warfarintreatment.   Indication:History DVT, PE, proteinSdeficiency Duration:indefinite Supervising physician:Paul Ryan  Anticoagulation Clinic Visit History: [name] not report signs/symptoms of bleeding or thromboembolism.Patient states he was accidentally taking warfarin 4 mg daily.  Anticoagulation Episode Summary    Current INR goal:   2.0-3.0  TTR:   71.3 % (2.8 y)  Next INR check:   07/02/2017  INR from last check:   3.7! (06/25/2017)  Weekly max warfarin dose:     Target end date:     INR check location:     Preferred lab:     Send INR reminders to:      Indications   Chronic anticoagulation [Z79.01] Coagulopathy (Paul Ryan) [[name]] DVT (deep venous thrombosis) (Paul Ryan) [[name]] Pulmonary embolism (Paul Ryan) [[name]] Hereditary protein S deficiency (Paul Ryan) [D68.59]       Comments:   DR Paul Ryan       No Known Allergies Medication Sig  albuterol (PROVENTIL HFA;VENTOLIN HFA) 108 (90 Base) MCG/ACT inhaler Inhale 2 puffs into the lungs every 4 (four) hours as needed for wheezing or shortness of breath.  b complex vitamins tablet Take 1 tablet by mouth daily.    cetirizine (ZYRTEC) 10 MG tablet TAKE 1 TABLET BY MOUTH EVERY DAY AS NEEDED FOR ALLERGIES  Cholecalciferol 4000 UNITS TABS Take 1 tablet by mouth daily.  Coenzyme Q10 (COQ10 PO) Take 2 tablets by mouth daily.  famotidine (PEPCID) 40 MG tablet Take 1 tablet (40 mg total) by mouth daily as needed for heartburn or indigestion.  Krill Oil 300 MG CAPS 1 cap daily  metFORMIN (GLUCOPHAGE) 500 MG tablet Take 1 tablet (500 mg total) by mouth 2 (two) times daily with a meal.  metroNIDAZOLE (FLAGYL) 500 MG tablet Take 1 tablet (500 mg total) by mouth 2 (two) times daily. For 7 days.  montelukast (SINGULAIR) 10 MG tablet Take 1 tablet (10 mg total) by mouth at bedtime.  NIACIN PO Take 1 tablet by mouth 3 (three) times  daily.   rosuvastatin (CRESTOR) 40 MG tablet Take 1 tablet (40 mg total) by mouth daily.  sildenafil (REVATIO) 20 MG tablet Take 1-5 tablets (20-100 mg total) by mouth daily as needed.  sulfamethoxazole-trimethoprim (BACTRIM DS,SEPTRA DS) 800-160 MG tablet Take 1 tablet by mouth 2 (two) times daily.  tacrolimus (PROTOPIC) 0.1 % ointment Apply topically 2 (two) times daily.  thyroid (ARMOUR THYROID) 60 MG tablet 60 mg tab 1 tab po M, W, Th, F, Sun  thyroid [facility] THYROID) 90 MG tablet 90 mg  Tab Tuesday and Saturday. 60 mg tab M, W, Th, F, Sun  warfarin (COUMADIN) 4 MG tablet Take 1 tablet (4 mg total) by mouth daily.   Past Medical History:  Diagnosis Date  . Acid reflux 02/03/2016  . Allergy    seasonal  . Anxiety 12/01/2016  . Chronic anticoagulation 06/14/2013  . Coagulopathy (Paul Ryan) 05/16/2011  . Collagen vascular disease (Paul Ryan)   . Cough 12/01/2016  . DVT (deep venous thrombosis) (Paul Ryan)    takes coumadin  . Dysuria 02/27/2013  . Erectile dysfunction 12/28/2016  . 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 Hyperglycemia 12/26/2014  . Hyperlipidemia   . Hypothyroid 06/10/2011  . Insomnia 04/24/2016  . Left hip pain 12/17/2010  . Paul (mild cognitive impairment) 07/22/2016  . Nasal lesion 07/22/2016  . Polymyalgia (Paul Ryan) 03/21/2014  . 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 Preventative health care 12/17/2010  . Shingles 1982   right  abdominal wall  . Staph skin infection 07/22/2016  . Thyroid nodule 11/29/2014  . Urinary hesitancy 09/30/2016  . Vitamin D deficiency 10/14/2011   Social History   Socioeconomic History  . Marital status: Married    Spouse name: Not on file  . Number of children: Not on file  . Years of education: Not on file  . Highest education level: Not on file  Occupational History  . Not on file  Social Needs  . Financial resource strain: Not on file  . Food insecurity:    Worry: Not on file    Inability: Not on file  . Transportation needs:     Medical: Not on file    Non-medical: Not on file  Tobacco Use  . Smoking status: Former Smoker    Packs/day: 1.00    Years: 20.00    Pack years: 20.00    Types: Cigarettes    Last attempt to quit: 03/03/1980    Years since quitting: 37.3  . Smokeless tobacco: Never Used  Substance and Sexual Activity  . Alcohol use: No    Alcohol/week: 0.0 oz  . Drug use: No  . Sexual activity: Not on file  Lifestyle  . Physical activity:    Days per week: Not on file    Minutes per session: Not on file  . Stress: Not on file  Relationships  . Social connections:    Talks on phone: Not on file    Gets together: Not on file    Attends religious service: Not on file    Active member of club or organization: Not on file    Attends meetings of clubs or organizations: Not on file    Relationship status: Not on file  Other Topics Concern  . Not on file  Social History Narrative  . Not on file   Family History  Problem Relation Age of Onset  . Cancer Mother        unknown- everywhere  . Cancer Sister        breast   ASSESSMENT Recent Results: Lab Results  Component Value Date   INR 3.7 06/25/2017   INR 2.2 06/04/2017   INR 3.3 05/27/2017   PROTIME 15.6 (H) 05/17/2013   Anticoagulation Dosing: Description   Dr. Beryle Ryan patient     INR today: Supratherapeutic  PLAN Patient states he accidentally took 4 mg daily instead of 2 mg twice per week. Advised patient to reduce dose to 2 mg twice a week (Tue/Thu), 4 mg all other days. Patient verbalized understanding.  Patient Instructions  Patient educated about medication as defined in this encounter and verbalized understanding by repeating back instructions provided.   Patient advised to contact clinic or seek medical attention if signs/symptoms of bleeding or thromboembolism occur.  Patient verbalized understanding by repeating back information and was advised to contact me if further medication-related questions arise. Patient  was also provided an information handout.  Follow-up Return in about 1 week (around 07/02/2017).  [name]

## 2017-06-28 NOTE — Progress Notes (Signed)
Reviewed Thanks DrG 

## 2017-06-29 ENCOUNTER — Encounter: Payer: Self-pay | Admitting: Podiatry

## 2017-06-29 ENCOUNTER — Ambulatory Visit: Payer: Medicare Other | Admitting: Podiatry

## 2017-06-29 ENCOUNTER — Ambulatory Visit (INDEPENDENT_AMBULATORY_CARE_PROVIDER_SITE_OTHER): Payer: Medicare Other

## 2017-06-29 DIAGNOSIS — M779 Enthesopathy, unspecified: Secondary | ICD-10-CM | POA: Diagnosis not present

## 2017-06-29 DIAGNOSIS — M7741 Metatarsalgia, right foot: Secondary | ICD-10-CM | POA: Diagnosis not present

## 2017-06-29 LAB — PROTIME-INR

## 2017-06-29 NOTE — Progress Notes (Signed)
Subjective:    Patient ID: Paul Ryan, male    DOB: [birth year], 72 y.o.   MRN: [id number]  HPI 72 year old male presents the office today for concerns of pain in the also aspect the right foot which is been ongoing the last couple weeks.  He points on the fifth metatarsal base where he gets his symptoms.  He denies any recent injury or trauma to his foot he denies any swelling or redness to the area.  He said no recent treatment for this.  He also states that the ball the foot occasionally becomes uncomfortable.  He feels that there is like a sock bunched up in the area.  He states that the foot aches but is not painful.  Denies any numbness or tingling.  No swelling.  He has had no other concerns.   Review of Systems  All other systems reviewed and are negative.  Past Medical History:  Diagnosis Date  . Acid reflux 02/03/2016  . Allergy    seasonal  . Anxiety 12/01/2016  . Chronic anticoagulation 06/14/2013  . Coagulopathy ([place]) 05/16/2011  . Collagen vascular disease ([place])   . Cough 12/01/2016  . DVT (deep venous thrombosis) ([name])    takes coumadin  . Dysuria 02/27/2013  . Erectile dysfunction 12/28/2016  . 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 Hyperglycemia 12/26/2014  . Hyperlipidemia   . Hypothyroid 06/10/2011  . Insomnia 04/24/2016  . Left hip pain 12/17/2010  . MCI (mild cognitive impairment) 07/22/2016  . Nasal lesion 07/22/2016  . Polymyalgia ([place]) 03/21/2014  . 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 Preventative health care 12/17/2010  . Shingles 1982   right abdominal wall  . Staph skin infection 07/22/2016  . Thyroid nodule 11/29/2014  . Urinary hesitancy 09/30/2016  . Vitamin D deficiency 10/14/2011    Past Surgical History:  Procedure Laterality Date  . 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  albuterol (PROVENTIL HFA;VENTOLIN HFA) 108 (90 Base) MCG/ACT inhaler, Inhale 2 puffs into the lungs  every 4 (four) hours as needed for wheezing or shortness of breath., Disp: 1 Inhaler, Rfl: 3 .  b complex vitamins tablet, Take 1 tablet by mouth daily.  , Disp: , Rfl:  .  cetirizine (ZYRTEC) 10 MG tablet, TAKE 1 TABLET BY MOUTH EVERY DAY AS NEEDED FOR ALLERGIES, Disp: 30 tablet, Rfl: 4 .  Cholecalciferol 4000 UNITS TABS, Take 1 tablet by mouth daily., Disp: , Rfl:  .  Coenzyme Q10 (COQ10 PO), Take 2 tablets by mouth daily., Disp: , Rfl:  .  famotidine (PEPCID) 40 MG tablet, Take 1 tablet (40 mg total) by mouth daily as needed for heartburn or indigestion., Disp: 30 tablet, Rfl: 5 .  Krill Oil 300 MG CAPS, 1 cap daily, Disp: , Rfl:  .  metFORMIN (GLUCOPHAGE) 500 MG tablet, Take 1 tablet (500 mg total) by mouth 2 (two) times daily with a meal., Disp: 30 tablet, Rfl: 3 .  metroNIDAZOLE (FLAGYL) 500 MG tablet, Take 1 tablet (500 mg total) by mouth 2 (two) times daily. For 7 days., Disp: 14 tablet, Rfl: 0 .  montelukast (SINGULAIR) 10 MG tablet, Take 1 tablet (10 mg total) by mouth at bedtime., Disp: 90 tablet, Rfl: 3 .  NIACIN PO, Take 1 tablet by mouth 3 (three) times daily. , Disp: , Rfl:  .  rosuvastatin (CRESTOR) 40 MG tablet, Take 1 tablet (40 mg total) by  mouth daily., Disp: 90 tablet, Rfl: 1 .  sildenafil (REVATIO) 20 MG tablet, Take 1-5 tablets (20-100 mg total) by mouth daily as needed., Disp: 35 tablet, Rfl: 2 .  sulfamethoxazole-trimethoprim (BACTRIM DS,SEPTRA DS) 800-160 MG tablet, Take 1 tablet by mouth 2 (two) times daily., Disp: 28 tablet, Rfl: 0 .  tacrolimus (PROTOPIC) 0.1 % ointment, Apply topically 2 (two) times daily., Disp: 100 g, Rfl: 1 .  thyroid (ARMOUR THYROID) 60 MG tablet, 60 mg tab 1 tab po M, W, Th, F, Sun, Disp: 75 tablet, Rfl: 1 .  thyroid (ARMOUR THYROID) 90 MG tablet, 90 mg  Tab Tuesday and Saturday. 60 mg tab M, W, Th, F, Sun, Disp: 30 tablet, Rfl: 1 .  warfarin (COUMADIN) 4 MG tablet, Take 1 tablet (4 mg total) by mouth daily., Disp: 90 tablet, Rfl: 3  No Known  Allergies  Social History   Socioeconomic History  . Marital status: Married    Spouse name: Not on file  . Number of children: Not on file  . Years of education: Not on file  . Highest education level: Not on file  Occupational History  . Not on file  Social Needs  . Financial resource strain: Not on file  . Food insecurity:    Worry: Not on file    Inability: Not on file  . Transportation needs:    Medical: Not on file    Non-medical: Not on file  Tobacco Use  . Smoking status: Former Smoker    Packs/day: 1.00    Years: 20.00    Pack years: 20.00    Types: Cigarettes    Last attempt to quit: 03/03/1980    Years since quitting: 37.3  . Smokeless tobacco: Never Used  Substance and Sexual Activity  . Alcohol use: No    Alcohol/week: 0.0 oz  . Drug use: No  . Sexual activity: Not on file  Lifestyle  . Physical activity:    Days per week: Not on file    Minutes per session: Not on file  . Stress: Not on file  Relationships  . Social connections:    Talks on phone: Not on file    Gets together: Not on file    Attends religious service: Not on file    Active member of club or organization: Not on file    Attends meetings of clubs or organizations: Not on file    Relationship status: Not on file  . Intimate partner violence:    Fear of current or ex partner: Not on file    Emotionally abused: Not on file    Physically abused: Not on file    Forced sexual activity: Not on file  Other Topics Concern  . Not on file  Social History Narrative  . Not on file        Objective:   Physical Exam  General: AAO x3, NAD  Dermatological: Skin is warm, dry and supple bilateral. Nails x 10 are well manicured; remaining integument appears unremarkable at this time. There are no open sores, no preulcerative lesions, no rash or signs of infection present.  Vascular: Dorsalis Pedis artery and Posterior Tibial artery pedal pulses are 2/4 bilateral with immedate capillary fill  time. Pedal hair growth present. No varicosities and no lower extremity edema present bilateral. There is no pain with calf compression, swelling, warmth, erythema.   Neruologic: Grossly intact via light touch bilateral. Vibratory intact via tuning fork bilateral. Protective threshold with Semmes Wienstein monofilament intact  to all pedal sites bilateral.  Musculoskeletal: There is a mild decrease in medial arch upon weightbearing.  Upon evaluation to the distal portion of the peroneal tendon along the insertion of the fifth metatarsal base is where he gets some discomfort but there is no specific pain.  There is no overlying edema, erythema, increase in warmth.  Peroneal tendon appears to be intact.  Achilles tendon appears to be intact.  Mild discomfort submetatarsal 1-5 on the right foot there is prominent metatarsal head plantarly but there is no specific area pinpoint bony tenderness or pain to vibratory sensation. Muscular strength 5/5 in all groups tested bilateral.  Gait: Unassisted, Nonantalgic.     Assessment & Plan:  71 year old male with peroneal tendinitis, metatarsalgia -Treatment options discussed including all alternatives, risks, and complications -Etiology of symptoms were discussed -X-rays were obtained and reviewed with the patient.  Small osteophyte present fifth metatarsal base but this appears to be chronic.  There is no evidence of acute fracture or stress fracture identified today. -We discussed a steroid injection but he wishes to hold off on this.  If symptoms continue we will do this next appointment. - We discussed the change in shoes as well as inserts for shoes presents with an over-the-counter insert.  We discussed a Superfeet or Powerstep insert -We discussed ice the area daily as well as some stretching, rehab exercises. -I dispensed metatarsal offloading pads -Follow-up in 4 weeks if symptoms continue or sooner if needed.  He agrees with this plan has no further  questions.  Trula Slade DPM

## 2017-06-29 NOTE — Progress Notes (Signed)
dgfoot

## 2017-06-29 NOTE — Patient Instructions (Signed)
You can look at getting a Powerstep or Superfeet insert to add to your shoes.  Ice the area daily   Peroneal Tendinopathy Rehab Ask your health care provider which exercises are safe for you. Do exercises exactly as told by your health care provider and adjust them as directed. It is normal to feel mild stretching, pulling, tightness, or discomfort as you do these exercises, but you should stop right away if you feel sudden pain or your pain gets worse.Do not begin these exercises until told by your health care provider. Stretching and range of motion exercises These exercises warm up your muscles and joints and improve the movement and flexibility of your ankle. These exercises also help to relieve pain and stiffness. Exercise A: Gastroc and soleus, standing 1. Stand on the edge of a step on the balls of your feet. The ball of your foot is on the walking surface, right under your toes. 2. Hold onto the railing for balance. 3. Slowly lift your left / right foot, allowing your body weight to press your left / right heel down over the edge of the step. You should feel a stretch in your left / right calf. 4. Hold this position for __________ seconds. Repeat __________ times with your left / right knee straight and __________ times with your left / right knee bent. Complete this stretch __________ times per day. Strengthening exercises These exercises improve the strength and endurance of your foot and ankle. Endurance is the ability to use your muscles for a long time, even after they get tired. Exercise B: Dorsiflexors  1. Secure a rubber exercise band or tube to an object, like a table leg, that will not move if it is pulled on. 2. Secure the other end of the band around your left / right foot. 3. Sit on the floor, facing the object with your left / right foot extended. The band or tube should be slightly tense when your foot is relaxed. 4. Slowly flex your left / right ankle and toes to bring  your foot toward you. 5. Hold this position for __________ seconds. 6. Slowly return your foot to the starting position. Repeat __________ times. Complete this exercise __________ times per day. Exercise C: Evertors 1. Sit on the floor with your legs straight out in front of you. 2. Loop a rubber exercise or band or tube around the ball of your left / right foot. The ball of your foot is on the walking surface, right under your toes. 3. Hold the ends of the band in your hands, or secure the band to a stable object. 4. Slowly push your foot outward, away from your other leg. 5. Hold this position for __________ seconds. 6. Slowly return your foot to the starting position. Repeat __________ times. Complete this exercise __________ times per day. Exercise D: Standing heel raise ( plantar flexion) 1. Stand with your feet shoulder-width apart with the balls of your feet on a step. The ball of your foot is on the walking surface, right under your toes. 2. Keep your weight spread evenly over the width of your feet while you rise up on your toes. Use a wall or railing to steady yourself, but try not to use it for support. 3. If this exercise is too easy, try these options: ? Shift your weight toward your left / right leg until you feel challenged. ? If told by your health care provider, stand on your left / right leg only. 4.  Hold this position for __________ seconds. Repeat __________ times. Complete this exercise __________ times per day. Exercise E: Single leg stand 1. Without shoes, stand near a railing or in a doorway. You may hold onto the railing or door frame as needed. 2. Stand on your left / right foot. Keep your big toe down on the floor and try to keep your arch lifted. ? Do not roll to the outside of your foot. ? If this exercise is too easy, you can try it with your eyes closed or while standing on a pillow. 3. Hold this position for __________ seconds. Repeat __________ times.  Complete this exercise __________ times per day. This information is not intended to replace advice given to you by your health care provider. Make sure you discuss any questions you have with your health care provider. Document Released: 02/17/2005 Document Revised: 10/25/2015 Document Reviewed: 01/06/2015 Elsevier Interactive Patient Education  [employer].

## 2017-07-07 ENCOUNTER — Other Ambulatory Visit: Payer: Self-pay | Admitting: Family Medicine

## 2017-07-07 ENCOUNTER — Encounter: Payer: Self-pay | Admitting: Family Medicine

## 2017-07-07 MED ORDER — METRONIDAZOLE 500 MG PO TABS: 500.0000 mg | ORAL_TABLET | Freq: Three times a day (TID) | ORAL | 0 refills | Status: DC

## 2017-07-08 ENCOUNTER — Encounter: Payer: Self-pay | Admitting: Pharmacist

## 2017-07-08 ENCOUNTER — Other Ambulatory Visit: Payer: Self-pay | Admitting: *Deleted

## 2017-07-08 ENCOUNTER — Telehealth: Payer: Self-pay | Admitting: *Deleted

## 2017-07-08 DIAGNOSIS — D689 Coagulation defect, unspecified: Secondary | ICD-10-CM

## 2017-07-08 DIAGNOSIS — D6859 Other primary thrombophilia: Secondary | ICD-10-CM

## 2017-07-08 DIAGNOSIS — Z7901 Long term (current) use of anticoagulants: Secondary | ICD-10-CM

## 2017-07-08 DIAGNOSIS — I82509 Chronic embolism and thrombosis of unspecified deep veins of unspecified lower extremity: Secondary | ICD-10-CM

## 2017-07-08 DIAGNOSIS — I2699 Other pulmonary embolism without acute cor pulmonale: Secondary | ICD-10-CM

## 2017-07-08 LAB — POCT INR: INR: 1.7

## 2017-07-08 MED ORDER — METRONIDAZOLE 500 MG PO TABS: 500.0000 mg | ORAL_TABLET | Freq: Three times a day (TID) | ORAL | 0 refills | Status: DC

## 2017-07-08 NOTE — Telephone Encounter (Signed)
INR: 1.7 per [facility] diag. Done today

## 2017-07-08 NOTE — Progress Notes (Signed)
Anticoagulation Management Paul Ryan a 72 y.o.malewho reports to the clinic for monitoring of warfarintreatment.   Indication:History DVT, PE, proteinSdeficiency Duration:indefinite Supervising physician:James Granfortuna  Anticoagulation Clinic Visit History: [name] not report signs/symptoms of bleeding or thromboembolism.  Anticoagulation Episode Summary    Current INR goal:   2.0-3.0  TTR:   71.1 % (2.9 y)  Next INR check:   07/02/2017  INR from last check:   1.7! 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  b complex vitamins tablet Take 1 tablet by mouth daily.    cetirizine (ZYRTEC) 10 MG tablet TAKE 1 TABLET BY MOUTH EVERY DAY AS NEEDED FOR ALLERGIES  Cholecalciferol 4000 UNITS TABS Take 1 tablet by mouth daily.  Coenzyme Q10 (COQ10 PO) Take 2 tablets by mouth daily.  famotidine (PEPCID) 40 MG tablet Take 1 tablet (40 mg total) by mouth daily as needed for heartburn or indigestion.  Krill Oil 300 MG CAPS 1 cap daily  metFORMIN (GLUCOPHAGE) 500 MG tablet Take 1 tablet (500 mg total) by mouth 2 (two) times daily with a meal.  metroNIDAZOLE (FLAGYL) 500 MG tablet Take 1 tablet (500 mg total) by mouth 3 (three) times daily for 10 days.  montelukast (SINGULAIR) 10 MG tablet Take 1 tablet (10 mg total) by mouth at bedtime.  NIACIN PO Take 1 tablet by mouth 3 (three) times daily.   rosuvastatin (CRESTOR) 40 MG tablet Take 1 tablet  (40 mg total) by mouth daily.  sildenafil (REVATIO) 20 MG tablet Take 1-5 tablets (20-100 mg total) by mouth daily as needed.  sulfamethoxazole-trimethoprim (BACTRIM DS,SEPTRA DS) 800-160 MG tablet Take 1 tablet by mouth 2 (two) times daily.  tacrolimus (PROTOPIC) 0.1 % ointment Apply topically 2 (two) times daily.  thyroid (ARMOUR THYROID) 60 MG tablet 60 mg tab 1 tab po M, W, Th, F, Sun  thyroid [facility] THYROID) 90 MG tablet 90 mg  Tab Tuesday and Saturday. 60 mg tab M, W, Th, F, Sun  warfarin (COUMADIN) 4 MG tablet Take 1 tablet (4 mg total) by mouth daily.   Past Medical History:  Diagnosis Date  . Acid reflux 02/03/2016  . Allergy    seasonal  . Anxiety 12/01/2016  . Chronic anticoagulation 06/14/2013  . Coagulopathy ([place]) 05/16/2011  . Collagen vascular disease ([place])   . Cough 12/01/2016  . DVT (deep venous thrombosis) ([name])    takes coumadin  . Dysuria 02/27/2013  . Erectile dysfunction 12/28/2016  . 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 Hyperglycemia 12/26/2014  . Hyperlipidemia   . Hypothyroid 06/10/2011  . Insomnia 04/24/2016  . Left hip pain 12/17/2010  . MCI (mild cognitive impairment) 07/22/2016  . Nasal lesion 07/22/2016  . Polymyalgia ([place]) 03/21/2014  . 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 Preventative health care 12/17/2010  . Shingles 1982   right abdominal wall  . Staph skin infection 07/22/2016  .  Thyroid nodule 11/29/2014  . Urinary hesitancy 09/30/2016  . Vitamin D deficiency 10/14/2011   Social History   Socioeconomic History  . Marital status: Married    Spouse name: Not on file  . Number of children: Not on file  . Years of education: Not on file  . Highest education level: Not on file  Occupational History  . Not on file  Social Needs  . Financial resource strain: Not on file  . Food insecurity:    Worry: Not on file    Inability: Not on file  . Transportation needs:    Medical: Not on file    Non-medical: Not on file   Tobacco Use  . Smoking status: Former Smoker    Packs/day: 1.00    Years: 20.00    Pack years: 20.00    Types: Cigarettes    Last attempt to quit: 03/03/1980    Years since quitting: 37.3  . Smokeless tobacco: Never Used  Substance and Sexual Activity  . Alcohol use: No    Alcohol/week: 0.0 oz  . Drug use: No  . Sexual activity: Not on file  Lifestyle  . Physical activity:    Days per week: Not on file    Minutes per session: Not on file  . Stress: Not on file  Relationships  . Social connections:    Talks on phone: Not on file    Gets together: Not on file    Attends religious service: Not on file    Active member of club or organization: Not on file    Attends meetings of clubs or organizations: Not on file    Relationship status: Not on file  Other Topics Concern  . Not on file  Social History Narrative  . Not on file   Family History  Problem Relation Age of Onset  . Cancer Mother        unknown- everywhere  . Cancer Sister        breast   ASSESSMENT Lab Results  Component Value Date   INR 1.7 07/08/2017   INR 3.7 06/25/2017   INR 2.2 06/04/2017   PROTIME 15.6 (H) 05/17/2013   Anticoagulation Dosing: Description   Dr. Beryle Beams patient     INR today: Subtherapeutic  PLAN Patient was previously on 2 mg Tue/Thu, 4 mg all other days. Patient took 2 mg last night. Advised him to take another dose of 2 mg this afternoon, then his normal 4 mg dose tonight, and then take 4 mg tomorrow rather than 2 mg. Clinic appointment scheduled for follow up. Patient also states he is starting metronidazole today, so anticipate possible need for dose reduction at follow up appointment.  There are no Patient Instructions on file for this visit. Patient advised to contact clinic or seek medical attention if signs/symptoms of bleeding or thromboembolism occur.  Patient verbalized understanding by repeating back information and was advised to contact me if further  medication-related questions arise. Patient was also provided an information handout.  Follow-up Return in about 2 days (around 07/10/2017).  [name]

## 2017-07-08 NOTE — Progress Notes (Signed)
Reviewed Thanks DrG 

## 2017-07-08 NOTE — Telephone Encounter (Signed)
INR addressed today. Patient has a clinic appointment in 2 days.

## 2017-07-09 LAB — PROTIME-INR

## 2017-07-09 NOTE — Telephone Encounter (Unsigned)
Copied from [place] [id number]. Topic: General - Other >> Jul 07, 2017  1:25 PM Carolyn Stare wrote:  Pharmacy need to clarify the below med   the quanity does not match up for 10 days or 7 days   metroNIDAZOLE (FLAGYL) 500 MG tablet  [place] with [facility] [address]  is requesting a call back

## 2017-07-10 ENCOUNTER — Ambulatory Visit (INDEPENDENT_AMBULATORY_CARE_PROVIDER_SITE_OTHER): Payer: Medicare Other | Admitting: Pharmacist

## 2017-07-10 DIAGNOSIS — D689 Coagulation defect, unspecified: Secondary | ICD-10-CM

## 2017-07-10 DIAGNOSIS — I2699 Other pulmonary embolism without acute cor pulmonale: Secondary | ICD-10-CM | POA: Diagnosis not present

## 2017-07-10 DIAGNOSIS — Z5181 Encounter for therapeutic drug level monitoring: Secondary | ICD-10-CM

## 2017-07-10 DIAGNOSIS — D6859 Other primary thrombophilia: Secondary | ICD-10-CM | POA: Diagnosis not present

## 2017-07-10 DIAGNOSIS — I82409 Acute embolism and thrombosis of unspecified deep veins of unspecified lower extremity: Secondary | ICD-10-CM | POA: Diagnosis not present

## 2017-07-10 DIAGNOSIS — Z7901 Long term (current) use of anticoagulants: Secondary | ICD-10-CM | POA: Diagnosis not present

## 2017-07-10 DIAGNOSIS — I82509 Chronic embolism and thrombosis of unspecified deep veins of unspecified lower extremity: Secondary | ICD-10-CM

## 2017-07-10 LAB — POCT INR: INR: 2.4

## 2017-07-10 NOTE — Progress Notes (Signed)
INTERNAL MEDICINE TEACHING ATTENDING ADDENDUM - Aldine Contes M.D  Duration- therapeutic, Indication- DVT, PE, Protein S def, INR- therapeutic. Agree with pharmacy recommendations as outlined in their note.

## 2017-07-10 NOTE — Progress Notes (Signed)
Anticoagulation Management Paul Ryan a 72 y.o.malewho reports to the clinic for monitoring of warfarintreatment.   Indication:History DVT, PE, proteinSdeficiency Duration:indefinite Supervising physician:James Granfortuna, [place]  Anticoagulation Clinic Visit History: [name] not report signs/symptoms of bleeding or thromboembolism.  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  b complex vitamins tablet Take 1 tablet by mouth daily.    cetirizine (ZYRTEC) 10 MG tablet TAKE 1 TABLET BY MOUTH EVERY DAY AS NEEDED FOR ALLERGIES  Cholecalciferol 4000 UNITS TABS Take 1 tablet by mouth daily.  Coenzyme Q10 (COQ10 PO) Take 2 tablets by mouth daily.  famotidine (PEPCID) 40 MG tablet Take 1 tablet (40 mg total) by mouth daily as needed for heartburn or indigestion.  Krill Oil 300 MG CAPS 1 cap daily  metFORMIN (GLUCOPHAGE) 500 MG tablet Take 1 tablet (500 mg total) by mouth 2 (two) times daily with a meal.  metroNIDAZOLE (FLAGYL) 500 MG tablet Take 1 tablet (500 mg total) by mouth 3 (three) times daily for 10 days.  montelukast (SINGULAIR) 10 MG tablet Take 1 tablet (10 mg total) by mouth at bedtime.  NIACIN PO Take 1 tablet by mouth 3 (three) times daily.   rosuvastatin (CRESTOR) 40 MG  tablet Take 1 tablet (40 mg total) by mouth daily.  sildenafil (REVATIO) 20 MG tablet Take 1-5 tablets (20-100 mg total) by mouth daily as needed.  sulfamethoxazole-trimethoprim (BACTRIM DS,SEPTRA DS) 800-160 MG tablet Take 1 tablet by mouth 2 (two) times daily.  tacrolimus (PROTOPIC) 0.1 % ointment Apply topically 2 (two) times daily.  thyroid (ARMOUR THYROID) 60 MG tablet 60 mg tab 1 tab po M, W, Th, F, Sun  thyroid [facility] THYROID) 90 MG tablet 90 mg  Tab Tuesday and Saturday. 60 mg tab M, W, Th, F, Sun  warfarin (COUMADIN) 4 MG tablet Take 1 tablet (4 mg total) by mouth daily.   Past Medical History:  Diagnosis Date  . Acid reflux 02/03/2016  . Allergy    seasonal  . Anxiety 12/01/2016  . Chronic anticoagulation 06/14/2013  . Coagulopathy ([place]) 05/16/2011  . Collagen vascular disease ([place])   . Cough 12/01/2016  . DVT (deep venous thrombosis) ([name])    takes coumadin  . Dysuria 02/27/2013  . Erectile dysfunction 12/28/2016  . 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 Hyperglycemia 12/26/2014  . Hyperlipidemia   . Hypothyroid 06/10/2011  . Insomnia 04/24/2016  . Left hip pain 12/17/2010  . MCI (mild cognitive impairment) 07/22/2016  . Nasal lesion 07/22/2016  . Polymyalgia ([place]) 03/21/2014  . 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 Preventative health care 12/17/2010  . Shingles 1982   right abdominal wall  . Staph skin infection  07/22/2016  . Thyroid nodule 11/29/2014  . Urinary hesitancy 09/30/2016  . Vitamin D deficiency 10/14/2011   Social History   Socioeconomic History  . Marital status: Married    Spouse name: Not on file  . Number of children: Not on file  . Years of education: Not on file  . Highest education level: Not on file  Occupational History  . Not on file  Social Needs  . Financial resource strain: Not on file  . Food insecurity:    Worry: Not on file    Inability: Not on file  . Transportation needs:    Medical: Not on file    Non-medical:  Not on file  Tobacco Use  . Smoking status: Former Smoker    Packs/day: 1.00    Years: 20.00    Pack years: 20.00    Types: Cigarettes    Last attempt to quit: 03/03/1980    Years since quitting: 37.3  . Smokeless tobacco: Never Used  Substance and Sexual Activity  . Alcohol use: No    Alcohol/week: 0.0 oz  . Drug use: No  . Sexual activity: Not on file  Lifestyle  . Physical activity:    Days per week: Not on file    Minutes per session: Not on file  . Stress: Not on file  Relationships  . Social connections:    Talks on phone: Not on file    Gets together: Not on file    Attends religious service: Not on file    Active member of club or organization: Not on file    Attends meetings of clubs or organizations: Not on file    Relationship status: Not on file  Other Topics Concern  . Not on file  Social History Narrative  . Not on file   Family History  Problem Relation Age of Onset  . Cancer Mother        unknown- everywhere  . Cancer Sister        breast   ASSESSMENT Recent Results: Lab Results  Component Value Date   INR 2.4 07/10/2017   INR 1.7 07/08/2017   INR 3.7 06/25/2017   PROTIME 15.6 (H) 05/17/2013   Anticoagulation Dosing: Description   Dr. Beryle Beams patient     INR today: Therapeutic  PLAN Weekly dose was unchanged. Patient started metronidazole therapy, will monitor more closely, RTC in 3 days  Patient Instructions  Patient educated about medication as defined in this encounter and verbalized understanding by repeating back instructions provided.   Patient advised to contact clinic or seek medical attention if signs/symptoms of bleeding or thromboembolism occur.  Patient verbalized understanding by repeating back information and was advised to contact me if further medication-related questions arise. Patient was also provided an information handout.  Follow-up Return in about 3 days (around 07/13/2017).  [name]  15 minutes spent  face-to-face with the patient during the encounter. 50% of time spent on education. 50% of time was spent on assessment, plan, coordination of care.

## 2017-07-10 NOTE — Patient Instructions (Signed)
Patient educated about medication as defined in this encounter and verbalized understanding by repeating back instructions provided.   

## 2017-07-13 ENCOUNTER — Ambulatory Visit: Payer: Medicare Other | Admitting: Pharmacist

## 2017-07-13 ENCOUNTER — Ambulatory Visit (INDEPENDENT_AMBULATORY_CARE_PROVIDER_SITE_OTHER): Payer: Medicare Other | Admitting: Pharmacist

## 2017-07-13 DIAGNOSIS — D689 Coagulation defect, unspecified: Secondary | ICD-10-CM | POA: Diagnosis not present

## 2017-07-13 DIAGNOSIS — I82409 Acute embolism and thrombosis of unspecified deep veins of unspecified lower extremity: Secondary | ICD-10-CM | POA: Diagnosis not present

## 2017-07-13 DIAGNOSIS — Z5181 Encounter for therapeutic drug level monitoring: Secondary | ICD-10-CM | POA: Diagnosis not present

## 2017-07-13 DIAGNOSIS — I2699 Other pulmonary embolism without acute cor pulmonale: Secondary | ICD-10-CM

## 2017-07-13 DIAGNOSIS — I824Y9 Acute embolism and thrombosis of unspecified deep veins of unspecified proximal lower extremity: Secondary | ICD-10-CM

## 2017-07-13 DIAGNOSIS — D6859 Other primary thrombophilia: Secondary | ICD-10-CM | POA: Diagnosis not present

## 2017-07-13 DIAGNOSIS — Z7901 Long term (current) use of anticoagulants: Secondary | ICD-10-CM

## 2017-07-13 LAB — POCT INR: INR: 2.2

## 2017-07-13 NOTE — Progress Notes (Signed)
Anticoagulation Management Paul Ryan is a 72 y.o. male who reports to the clinic for monitoring of warfarin treatment.    Indication: Chronic anticoagulation [Z79.01], Coagulopathy ([name]) [[name]], DVT ([place]) [182.409], Pulmonary Embolism ([place]) [I126.99], Hereditary coagulopathy.    Duration: indefinite Supervising physician: Murriel Hopper  Anticoagulation Clinic Visit History: Patient does not report signs/symptoms of bleeding or thromboembolism  Other recent changes: No diet, medications, lifestyle--EXCEPT AS NOTED in patient findings (has commenced upon metronidazole--known potential serious DDI with warfarin causing hypoprothrombinemic response). Will empirically OMIT today's dose and decrease as documented below.  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 Authorizing Provider  albuterol (PROVENTIL HFA;VENTOLIN HFA) 108 (90 Base) MCG/ACT inhaler Inhale 2 puffs into the lungs every 4 (four) hours as needed for wheezing or shortness of breath. 05/03/15  Yes [name], MD  b complex vitamins tablet Take 1 tablet by mouth daily.     Yes [provider]  cetirizine (ZYRTEC) 10 MG tablet TAKE 1 TABLET BY MOUTH EVERY DAY AS NEEDED FOR ALLERGIES 05/26/17  Yes [name], MD  Cholecalciferol 4000 UNITS TABS Take 1 tablet by mouth daily.   Yes [provider]  Coenzyme Q10 (COQ10 PO) Take 2  tablets by mouth daily.   Yes [provider]  famotidine (PEPCID) 40 MG tablet Take 1 tablet (40 mg total) by mouth daily as needed for heartburn or indigestion. 12/01/16  Yes [name], MD  Krill Oil 300 MG CAPS 1 cap daily 10/12/12  Yes [name], MD  metFORMIN (GLUCOPHAGE) 500 MG tablet Take 1 tablet (500 mg total) by mouth 2 (two) times daily with a meal. 12/29/16  Yes [name], MD  metroNIDAZOLE (FLAGYL) 500 MG tablet Take 1 tablet (500 mg total) by mouth 3 (three) times daily for 10 days. 07/08/17 07/18/17 Yes Lowne Lyndal Pulley R, DO  montelukast (SINGULAIR) 10 MG tablet Take 1 tablet (10 mg total) by mouth at bedtime. 06/16/17  Yes [name], MD  NIACIN PO Take 1 tablet by mouth 3 (three) times daily.    Yes [provider]  rosuvastatin (CRESTOR) 40 MG tablet Take 1 tablet (40 mg total) by mouth daily. 07/22/16  Yes [name], MD  sildenafil (REVATIO) 20 MG tablet Take 1-5 tablets (20-100 mg total) by mouth daily as needed. 12/25/16  Yes [name], MD  tacrolimus (PROTOPIC) 0.1 % ointment Apply topically 2 (two) times daily. 02/22/13  Yes [name], MD  thyroid [facility] THYROID) 60 MG tablet 60 mg tab 1 tab po M, W, Th, F, Sun 06/18/17  Yes [name], MD  thyroid [facility] THYROID) 90 MG tablet 90 mg  Tab Tuesday and Saturday. 60 mg tab M, W, Th, F, Sun  06/18/17  Yes [name], MD  warfarin (COUMADIN) 4 MG tablet Take 1 tablet (4 mg total) by mouth daily. 10/06/16 10/06/17 Yes Annia Belt, MD  sulfamethoxazole-trimethoprim (BACTRIM DS,SEPTRA DS) 800-160 MG tablet Take 1 tablet by mouth 2 (two) times daily. Patient not taking: Reported on 07/13/2017 12/25/16   [name], MD   Past Medical History:  Diagnosis Date  . Acid reflux 02/03/2016  . Allergy    seasonal  . Anxiety 12/01/2016  . Chronic anticoagulation 06/14/2013  . Coagulopathy ([place]) 05/16/2011  . Collagen vascular disease ([place])   . Cough 12/01/2016  . DVT  (deep venous thrombosis) ([name])    takes coumadin  . Dysuria 02/27/2013  . Erectile dysfunction 12/28/2016  . 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 Hyperglycemia 12/26/2014  . Hyperlipidemia   . Hypothyroid 06/10/2011  . Insomnia 04/24/2016  . Left hip pain 12/17/2010  . MCI (mild cognitive impairment) 07/22/2016  . Nasal lesion 07/22/2016  . Polymyalgia ([place]) 03/21/2014  . 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 Preventative health care 12/17/2010  . Shingles 1982   right abdominal wall  . Staph skin infection 07/22/2016  . Thyroid nodule 11/29/2014  . Urinary hesitancy 09/30/2016  . Vitamin D deficiency 10/14/2011   Social History   Socioeconomic History  . Marital status: Married    Spouse name: Not on file  . Number of children: Not on file  . Years of education: Not on file  . Highest education level: Not on file  Occupational History  . Not on file  Social Needs  . Financial resource strain: Not on file  . Food insecurity:    Worry: Not on file    Inability: Not on file  . Transportation needs:    Medical: Not on file    Non-medical: Not on file  Tobacco Use  . Smoking status: Former Smoker    Packs/day: 1.00    Years: 20.00    Pack years: 20.00    Types: Cigarettes    Last attempt to quit: 03/03/1980    Years since quitting: 37.3  . Smokeless tobacco: Never Used  Substance and Sexual Activity  . Alcohol use: No    Alcohol/week: 0.0 oz  . Drug use: No  . Sexual activity: Not on file  Lifestyle  . Physical activity:    Days per week: Not on file    Minutes per session: Not on file  . Stress: Not on file  Relationships  . Social connections:    Talks on phone: Not on file    Gets together: Not on file    Attends religious service: Not on file    Active member of club or organization: Not on file    Attends meetings of clubs or organizations: Not on file    Relationship status: Not on file  Other Topics Concern  . Not on file  Social  History Narrative  . Not on file   Family History  Problem Relation Age of Onset  . Cancer Mother        unknown- everywhere  . Cancer Sister        breast    ASSESSMENT Recent Results: The most recent result is correlated with 28 mg per week: Lab Results  Component Value Date   INR 2.20 07/13/2017   INR 2.4 07/10/2017   INR 1.7 07/08/2017   PROTIME 15.6 (H) 05/17/2013    Anticoagulation Dosing: Description   Dr. Beryle Beams patient  INR today: Supratherapeutic  PLAN Weekly dose was decreased by 28% to OMITTED dose for today, tomorrow  Recommence as shown in Patient Instructions Section. Re-evaluate INR on 21-MAY-19. Metronidazole to end Saturday 18-MAY-19.  Patient Instructions  Patient instructed to take medications as defined in the Anti-coagulation Track section of this encounter.  Patient instructed to OMIT today's dose.  Patient instructed to OMIT today's dose--commence warfarin on Tuesday, May 14th by taking 1/2 tablet of your 4mg  blue warfarin tablet; On Wednesdays, take one (1) tablet, on Thursday--take 1/2 tablet; then, resume taking one (1) tablet of your blue-colored 4mg  warfarin tablets.  Patient verbalized understanding of these instructions.    Patient Instructions  Patient instructed to take medications as defined in the Anti-coagulation Track section of this encounter.  Patient instructed to OMIT today's dose.  Patient instructed to OMIT today's dose--commence warfarin on Tuesday, May 14th by taking 1/2 tablet of your 4mg  blue warfarin tablet; On Wednesdays, take one (1) tablet, on Thursday--take 1/2 tablet; then, resume taking one (1) tablet of your blue-colored 4mg  warfarin tablets.  Patient verbalized understanding of these instructions.     Patient advised to contact clinic or seek medical attention if signs/symptoms of bleeding or thromboembolism occur.  Patient verbalized understanding by repeating back information and was advised to contact me  if further medication-related questions arise. Patient was also provided an information handout.  Follow-up Return in about 8 days (around 07/21/2017) for Follow up INR at 1100h.  Pennie Banter, PharmD, CACP, CPP  15 minutes spent face-to-face with the patient during the encounter. 50% of time spent on education. 50% of time was spent on fingerstick point of care INR sample collection, processing, results determination, dose calculation and documentation in [email or web address].

## 2017-07-13 NOTE — Progress Notes (Signed)
Reviewed Thanks DrG 

## 2017-07-13 NOTE — Patient Instructions (Signed)
Patient instructed to take medications as defined in the Anti-coagulation Track section of this encounter.  Patient instructed to OMIT today's dose.  Patient instructed to OMIT today's dose--commence warfarin on Tuesday, May 14th by taking 1/2 tablet of your 4mg  blue warfarin tablet; On Wednesdays, take one (1) tablet, on Thursday--take 1/2 tablet; then, resume taking one (1) tablet of your blue-colored 4mg  warfarin tablets.  Patient verbalized understanding of these instructions.

## 2017-07-14 NOTE — Progress Notes (Signed)
Reviewed thx DrG 

## 2017-07-15 NOTE — Progress Notes (Signed)
Reviewed Thanks DrG 

## 2017-07-15 NOTE — Progress Notes (Signed)
INTERNAL MEDICINE TEACHING ATTENDING ADDENDUM  I agree with pharmacy recommendations as outlined in their note.   Alexander N Raines, MD  

## 2017-07-16 ENCOUNTER — Encounter: Payer: Self-pay | Admitting: Podiatry

## 2017-07-16 ENCOUNTER — Ambulatory Visit: Payer: Medicare Other | Admitting: Podiatry

## 2017-07-16 DIAGNOSIS — M7751 Other enthesopathy of right foot: Secondary | ICD-10-CM

## 2017-07-16 DIAGNOSIS — L603 Nail dystrophy: Secondary | ICD-10-CM | POA: Diagnosis not present

## 2017-07-16 DIAGNOSIS — M779 Enthesopathy, unspecified: Secondary | ICD-10-CM

## 2017-07-16 MED ORDER — DEXAMETHASONE SODIUM PHOSPHATE 120 MG/30ML IJ SOLN
4.0000 mg | Freq: Once | INTRAMUSCULAR | Status: AC
  Administered 2017-07-16: 4 mg via INTRA_ARTICULAR

## 2017-07-16 NOTE — Progress Notes (Signed)
Subjective: 72 year old male presents the office today for follow-up evaluation of pain to the inside aspect of his right foot.  He states this does continue although somewhat improved after getting an over-the-counter insert.  The pain to the ball the foot is improved.  He like to proceed with a steroid injection of the right foot today.  He is also concerned that the left toenail gotten thickened discolored he did trim the nail however piece remained in place causing pain.  He states that when it wearing orthotics and cause pressure and pain to the toenail.  Denies any redness or drainage or any swelling. Denies any systemic complaints such as fevers, chills, nausea, vomiting. No acute changes since last appointment, and no other complaints at this time.   Objective: AAO x3, NAD Neurovascular status unchanged Tenderness palpation of the fifth metatarsal base of the right foot.  There is no significant discomfort on the peroneal tendon this appears to be intact.  There is no significant discomfort submetatarsal area.  There is no area pinpoint tenderness identified otherwise.  There is no significant edema, erythema, increase in warmth.  On the left hallux the nail is dystrophic with yellow to brown discoloration there is evidence of old subungual hematoma.  Part of the distal lateral portion of nail remains intact and this is where there is tenderness.  There is no drainage or pus or any clinical signs of infection.  No other open lesions or pre-ulcerative lesions.  There is no pain with calf compression, swelling, warmth, erythema.  Assessment: Right fifth metatarsal base insertional peroneal tendinitis, left hallux onychodystrophy  Plan: -All treatment options discussed with the patient including all alternatives, risks, complications.  -On the right foot he wishes to proceed with a steroid injection.  See procedure note below.  I did modify his into that he purchase and out of the lateral post to  see if this will help.  Ice to the area daily as well as continue with supportive shoes and inserts. -I debrided the spicule to the lateral aspect left hallux toenail feel any complications of bleeding after debridement there is resolution of his pain. -Patient encouraged to call the office with any questions, concerns, change in symptoms.   Trula Slade DPM

## 2017-07-20 ENCOUNTER — Ambulatory Visit: Payer: Medicare Other | Admitting: Pharmacist

## 2017-07-20 DIAGNOSIS — Z7901 Long term (current) use of anticoagulants: Secondary | ICD-10-CM

## 2017-07-20 DIAGNOSIS — D6859 Other primary thrombophilia: Secondary | ICD-10-CM

## 2017-07-20 DIAGNOSIS — D689 Coagulation defect, unspecified: Secondary | ICD-10-CM

## 2017-07-20 DIAGNOSIS — I824Y9 Acute embolism and thrombosis of unspecified deep veins of unspecified proximal lower extremity: Secondary | ICD-10-CM

## 2017-07-20 DIAGNOSIS — I2699 Other pulmonary embolism without acute cor pulmonale: Secondary | ICD-10-CM

## 2017-07-20 LAB — PROTIME-INR

## 2017-07-20 LAB — POCT INR: INR: 2.7 (ref 2.0–3.0)

## 2017-07-20 NOTE — Patient Instructions (Signed)
Patient educated about medication as defined in this encounter and verbalized understanding by repeating back instructions provided.   

## 2017-07-21 ENCOUNTER — Encounter: Payer: Self-pay | Admitting: Podiatry

## 2017-07-21 ENCOUNTER — Ambulatory Visit: Payer: Medicare Other

## 2017-07-22 NOTE — Progress Notes (Signed)
Anticoagulation Management Paul Ryan is a 72 y.o. male who reports to the clinic for monitoring of warfarin treatment.    Indication: Chronic anticoagulation [Z79.01], Coagulopathy ([name]) [[name]], DVT ([place]) [182.409], Pulmonary Embolism ([place]) [I126.99], Hereditary coagulopathy.    Duration: indefinite Supervising physician: Murriel Hopper   Anticoagulation Clinic Visit History: Patient does not report signs/symptoms of bleeding or thromboembolism. He completed a course of metronidazole therapy.  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  b complex vitamins tablet Take 1 tablet by mouth daily.    cetirizine (ZYRTEC) 10 MG tablet TAKE 1 TABLET BY MOUTH EVERY DAY AS NEEDED FOR ALLERGIES  Cholecalciferol 4000 UNITS TABS Take 1 tablet by mouth daily.  Coenzyme Q10 (COQ10 PO) Take 2 tablets by mouth daily.  famotidine (PEPCID) 40 MG tablet Take 1 tablet (40 mg total) by mouth daily as needed for heartburn or indigestion.  Krill Oil 300 MG CAPS 1 cap daily  metFORMIN (GLUCOPHAGE) 500 MG tablet Take 1 tablet (500 mg total) by mouth 2 (two) times daily with a meal.  montelukast (SINGULAIR) 10 MG tablet Take 1 tablet (10 mg total) by mouth at bedtime.  NIACIN PO Take 1 tablet by mouth 3 (three) times daily.    rosuvastatin (CRESTOR) 40 MG tablet Take 1 tablet (40 mg total) by mouth daily.  sildenafil (REVATIO) 20 MG tablet Take 1-5 tablets (20-100 mg total) by mouth daily as needed.  sulfamethoxazole-trimethoprim (BACTRIM DS,SEPTRA DS) 800-160 MG tablet Take 1 tablet by mouth 2 (two) times daily. Patient not taking: Reported on 07/13/2017  tacrolimus (PROTOPIC) 0.1 % ointment Apply topically 2 (two) times daily.  thyroid (ARMOUR THYROID) 60 MG tablet 60 mg tab 1 tab po M, W, Th, F, Sun  thyroid [name]) 90 MG tablet 90 mg  Tab Tuesday and Saturday. 60 mg tab M, W, Th, F, Sun  warfarin (COUMADIN) 4 MG tablet Take 1 tablet (4 mg total) by mouth daily.   Past Medical History:  Diagnosis Date  . Acid reflux 02/03/2016  . Allergy    seasonal  . Anxiety 12/01/2016  . Chronic anticoagulation 06/14/2013  . Coagulopathy ([place]) 05/16/2011  . Collagen vascular disease ([place])   . Cough 12/01/2016  . DVT (deep venous thrombosis) ([name])    takes coumadin  . Dysuria 02/27/2013  . Erectile dysfunction 12/28/2016  . 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 Hyperglycemia 12/26/2014  . Hyperlipidemia   . Hypothyroid 06/10/2011  . Insomnia 04/24/2016  . Left hip pain 12/17/2010  . MCI (mild cognitive impairment) 07/22/2016  . Nasal lesion 07/22/2016  . Polymyalgia ([place]) 03/21/2014  . 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 Preventative health care 12/17/2010  .  Shingles 1982   right abdominal wall  . Staph skin infection 07/22/2016  . Thyroid nodule 11/29/2014  . Urinary hesitancy 09/30/2016  . Vitamin D deficiency 10/14/2011   Social History   Socioeconomic History  . Marital status: Married    Spouse name: Not on file  . Number of children: Not on file  . Years of education: Not on file  . Highest education level: Not on file  Occupational History  . Not on file  Social Needs  . Financial resource strain: Not on file  . Food insecurity:    Worry: Not on file    Inability: Not  on file  . Transportation needs:    Medical: Not on file    Non-medical: Not on file  Tobacco Use  . Smoking status: Former Smoker    Packs/day: 1.00    Years: 20.00    Pack years: 20.00    Types: Cigarettes    Last attempt to quit: 03/03/1980    Years since quitting: 37.4  . Smokeless tobacco: Never Used  Substance and Sexual Activity  . Alcohol use: No    Alcohol/week: 0.0 oz  . Drug use: No  . Sexual activity: Not on file  Lifestyle  . Physical activity:    Days per week: Not on file    Minutes per session: Not on file  . Stress: Not on file  Relationships  . Social connections:    Talks on phone: Not on file    Gets together: Not on file    Attends religious service: Not on file    Active member of club or organization: Not on file    Attends meetings of clubs or organizations: Not on file    Relationship status: Not on file  Other Topics Concern  . Not on file  Social History Narrative  . Not on file   Family History  Problem Relation Age of Onset  . Cancer Mother        unknown- everywhere  . Cancer Sister        breast   ASSESSMENT Recent Results: Lab Results  Component Value Date   INR 2.7 07/20/2017   INR 2.20 07/13/2017   INR 2.4 07/10/2017   PROTIME 15.6 (H) 05/17/2013   Anticoagulation Dosing: Description   Dr. Beryle Beams patient     INR today: Therapeutic  PLAN Since patient is no longer taking metronidazole, will re-start previous dose of 4 mg daily.  Patient Instructions  Patient educated about medication as defined in this encounter and verbalized understanding by repeating back instructions provided.   Patient advised to contact clinic or seek medical attention if signs/symptoms of bleeding or thromboembolism occur.  Patient verbalized understanding by repeating back information and was advised to contact me if further medication-related questions arise. Patient was also provided an information handout.  Follow-up Re-check INR in 3  days  [name]

## 2017-07-23 ENCOUNTER — Ambulatory Visit: Payer: Medicare Other | Admitting: *Deleted

## 2017-07-23 LAB — PROTIME-INR

## 2017-07-24 ENCOUNTER — Ambulatory Visit (INDEPENDENT_AMBULATORY_CARE_PROVIDER_SITE_OTHER): Payer: Medicare Other | Admitting: Podiatry

## 2017-07-24 ENCOUNTER — Encounter: Payer: Self-pay | Admitting: Podiatry

## 2017-07-24 ENCOUNTER — Ambulatory Visit: Payer: Medicare Other | Admitting: Podiatry

## 2017-07-24 DIAGNOSIS — G5701 Lesion of sciatic nerve, right lower limb: Secondary | ICD-10-CM | POA: Diagnosis not present

## 2017-07-24 DIAGNOSIS — D3610 Benign neoplasm of peripheral nerves and autonomic nervous system, unspecified: Secondary | ICD-10-CM

## 2017-07-24 MED ORDER — TRIAMCINOLONE ACETONIDE 10 MG/ML IJ SUSP: 10.0000 mg | Freq: Once | INTRAMUSCULAR | Status: DC

## 2017-07-29 NOTE — Progress Notes (Signed)
Subjective: Ervie presents the office today for concerns of areas of new pain.  She sees any pain in between the second and third toes and the toes have been getting numbness and sharp pain at times.  He states that overall his pain that continued for previously has improved greatly and this is what caused the majority of symptoms.  No increased swelling he has noticed that he denies any recent injury or trauma. Denies any systemic complaints such as fevers, chills, nausea, vomiting. No acute changes since last appointment, and no other complaints at this time.   Objective: AAO x3, NAD DP/PT pulses palpable bilaterally, CRT less than 3 seconds There is tenderness palpation on second interspace of the right foot there is a palpable neuroma identified.  No area pinpoint bony tenderness or pain to vibratory sensation.  There is no significant edema, erythema, increase in warmth.  Other pain or other significant for previously is much improved and there is no other areas of tenderness.  No open lesions or pre-ulcerative lesions.  No pain with calf compression, swelling, warmth, erythema  Assessment: Neuroma right foot  Plan: -All treatment options discussed with the patient including all alternatives, risks, complications.  -Discussed a steroid injection he wished to proceed.  See procedure note below.  Neuro pads were dispensed.  Discussed wearing supportive shoes as well as inserts. -Patient encouraged to call the office with any questions, concerns, change in symptoms.   Procedure: Injection neuroma Discussed alternatives, risks, complications and verbal consent was obtained.  Location: Right second interspace Skin Prep: Alcohol Injectate: 0.5cc 0.5% marcaine plain, 0.5 cc 2% lidocaine plain and, 1 cc kenalog 10. Disposition: Patient tolerated procedure well. Injection site dressed with a band-aid.  Post-injection care was discussed and return precautions discussed.   Trula Slade  DPM

## 2017-08-05 ENCOUNTER — Encounter: Payer: Self-pay | Admitting: Pharmacist

## 2017-08-05 DIAGNOSIS — I824Y9 Acute embolism and thrombosis of unspecified deep veins of unspecified proximal lower extremity: Secondary | ICD-10-CM

## 2017-08-05 DIAGNOSIS — Z7901 Long term (current) use of anticoagulants: Secondary | ICD-10-CM

## 2017-08-05 DIAGNOSIS — D689 Coagulation defect, unspecified: Secondary | ICD-10-CM

## 2017-08-05 DIAGNOSIS — I2699 Other pulmonary embolism without acute cor pulmonale: Secondary | ICD-10-CM

## 2017-08-05 DIAGNOSIS — D6859 Other primary thrombophilia: Secondary | ICD-10-CM

## 2017-08-05 LAB — POCT INR: INR: 1.6 — AB (ref 2.0–3.0)

## 2017-08-05 NOTE — Progress Notes (Signed)
Reviewed & agree Thanks DrG 

## 2017-08-05 NOTE — Progress Notes (Signed)
Anticoagulation Management Paul Ryan a 72 y.o.malewho reports to the clinic for monitoring of warfarintreatment.   Indication:Chronic anticoagulation [Z79.01], Coagulopathy (Paul Ryan) [Paul Ryan], DVT (Paul Ryan) [182.409], Pulmonary Embolism (Paul Ryan) [I126.99], Hereditary coagulopathy.  Duration:indefinite Supervising physician:Paul Ryan    Anticoagulation Episode Summary    Current INR goal:   2.0-3.0  TTR:   71.2 % (3 y)  Next INR check:   08/05/2017  INR from last check:   1.6! 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  b complex vitamins tablet Take 1 tablet by mouth daily.    cetirizine (ZYRTEC) 10 MG tablet TAKE 1 TABLET BY MOUTH EVERY DAY AS NEEDED FOR ALLERGIES  Cholecalciferol 4000 UNITS TABS Take 1 tablet by mouth daily.  Coenzyme Q10 (COQ10 PO) Take 2 tablets by mouth daily.  famotidine (PEPCID) 40 MG tablet Take 1 tablet (40 mg total) by mouth daily as needed for heartburn or indigestion.  Krill Oil 300 MG CAPS 1 cap daily  metFORMIN (GLUCOPHAGE) 500 MG tablet Take 1 tablet (500 mg total) by mouth 2 (two) times daily with a meal.  montelukast (SINGULAIR) 10 MG tablet Take 1 tablet (10 mg total) by mouth at bedtime.  NIACIN PO Take 1 tablet by mouth 3 (three) times daily.   rosuvastatin (CRESTOR) 40 MG tablet Take 1 tablet (40 mg total) by mouth daily.  sildenafil (REVATIO) 20 MG tablet Take 1-5 tablets (20-100 mg total) by mouth  daily as needed.  sulfamethoxazole-trimethoprim (BACTRIM DS,SEPTRA DS) 800-160 MG tablet Take 1 tablet by mouth 2 (two) times daily. Patient not taking: Reported on 07/13/2017  tacrolimus (PROTOPIC) 0.1 % ointment Apply topically 2 (two) times daily.  thyroid (ARMOUR THYROID) 60 MG tablet 60 mg tab 1 tab po M, W, Th, F, Sun  thyroid [facility] THYROID) 90 MG tablet 90 mg  Tab Tuesday and Saturday. 60 mg tab M, W, Th, F, Sun  warfarin (COUMADIN) 4 MG tablet Take 1 tablet (4 mg total) by mouth daily.   Past Medical History:  Diagnosis Date  . Acid reflux 02/03/2016  . Allergy    seasonal  . Anxiety 12/01/2016  . Chronic anticoagulation 06/14/2013  . Coagulopathy ([place]) 05/16/2011  . Collagen vascular disease ([place])   . Cough 12/01/2016  . DVT (deep venous thrombosis) (Paul Ryan)    takes coumadin  . Dysuria 02/27/2013  . Erectile dysfunction 12/28/2016  . 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 Hyperglycemia 12/26/2014  . Hyperlipidemia   . Hypothyroid 06/10/2011  . Insomnia 04/24/2016  . Left hip pain 12/17/2010  . MCI (mild cognitive impairment) 07/22/2016  . Nasal lesion 07/22/2016  . Polymyalgia ([place]) 03/21/2014  . 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 Preventative health care 12/17/2010  . Shingles 1982   right abdominal wall  . Staph skin infection 07/22/2016  . Thyroid nodule 11/29/2014  . Urinary hesitancy 09/30/2016  . Vitamin D deficiency  10/14/2011   Social History   Socioeconomic History  . Marital status: Married    Spouse name: Not on file  . Number of children: Not on file  . Years of education: Not on file  . Highest education level: Not on file  Occupational History  . Not on file  Social Needs  . Financial resource strain: Not on file  . Food insecurity:    Worry: Not on file    Inability: Not on file  . Transportation needs:    Medical: Not on file    Non-medical: Not on file  Tobacco Use  . Smoking status: Former Smoker    Packs/day: 1.00     Years: 20.00    Pack years: 20.00    Types: Cigarettes    Last attempt to quit: 03/03/1980    Years since quitting: 37.4  . Smokeless tobacco: Never Used  Substance and Sexual Activity  . Alcohol use: No    Alcohol/week: 0.0 oz  . Drug use: No  . Sexual activity: Not on file  Lifestyle  . Physical activity:    Days per week: Not on file    Minutes per session: Not on file  . Stress: Not on file  Relationships  . Social connections:    Talks on phone: Not on file    Gets together: Not on file    Attends religious service: Not on file    Active member of club or organization: Not on file    Attends meetings of clubs or organizations: Not on file    Relationship status: Not on file  Other Topics Concern  . Not on file  Social History Narrative  . Not on file   Family History  Problem Relation Age of Onset  . Cancer Mother        unknown- everywhere  . Cancer Sister        breast   ASSESSMENT Recent Results: Lab Results  Component Value Date   INR 1.6 (A) 08/05/2017   INR 2.7 07/20/2017   INR 2.20 07/13/2017   PROTIME 15.6 (H) 05/17/2013   Anticoagulation Dosing: Description   Dr. Beryle Ryan patient     INR today: Subtherapeutic, patient reports eating more greens than normal  PLAN Weekly dose was increased by 21% to 34 mg per week, provided education on consistent dietary vitamin k intake. Initiated enoxaparin 120 mg daily  There are no Patient Instructions on file for this visit. Patient advised to contact clinic or seek medical attention if signs/symptoms of bleeding or thromboembolism occur.  Patient verbalized understanding by repeating back information and was advised to contact me if further medication-related questions arise. Patient was also provided an information handout.  Follow-up 3 days  Paul Ryan

## 2017-08-10 ENCOUNTER — Encounter: Payer: Self-pay | Admitting: Pharmacist

## 2017-08-10 DIAGNOSIS — I824Y9 Acute embolism and thrombosis of unspecified deep veins of unspecified proximal lower extremity: Secondary | ICD-10-CM

## 2017-08-10 DIAGNOSIS — D689 Coagulation defect, unspecified: Secondary | ICD-10-CM

## 2017-08-10 DIAGNOSIS — D6859 Other primary thrombophilia: Secondary | ICD-10-CM

## 2017-08-10 DIAGNOSIS — I2699 Other pulmonary embolism without acute cor pulmonale: Secondary | ICD-10-CM

## 2017-08-10 DIAGNOSIS — Z7901 Long term (current) use of anticoagulants: Secondary | ICD-10-CM

## 2017-08-10 LAB — POCT INR: INR: 1.9 — AB (ref 2.0–3.0)

## 2017-08-10 NOTE — Progress Notes (Signed)
Reviewed thx DrG 

## 2017-08-10 NOTE — Progress Notes (Signed)
Anticoagulation Management Paul Ryan a 72 y.o.malewho reports to the clinic for monitoring of warfarintreatment.   Indication:Chronic anticoagulation [Z79.01], Coagulopathy ([place]) [[name]], DVT ([place]) [182.409], Pulmonary Embolism ([place]) [I126.99], Hereditary coagulopathy.  Duration:indefinite Supervising physician:Paul Ryan   Anticoagulation Clinic Visit History: Patient does not report signs/symptoms of bleeding or thromboembolism.    Anticoagulation Episode Summary    Current INR goal:   2.0-3.0  TTR:   70.9 % (3 y)  Next INR check:   08/13/2017  INR from last check:   1.9! 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  b complex vitamins tablet Take 1 tablet by mouth daily.    cetirizine (ZYRTEC) 10 MG tablet TAKE 1 TABLET BY MOUTH EVERY DAY AS NEEDED FOR ALLERGIES  Cholecalciferol 4000 UNITS TABS Take 1 tablet by mouth daily.  Coenzyme Q10 (COQ10 PO) Take 2 tablets by mouth daily.  famotidine (PEPCID) 40 MG tablet Take 1 tablet (40 mg total) by mouth daily as needed for heartburn or indigestion.  Krill Oil 300 MG CAPS 1 cap daily  metFORMIN (GLUCOPHAGE) 500 MG tablet Take 1 tablet (500 mg total) by mouth 2 (two) times daily with a meal.  montelukast (SINGULAIR) 10 MG tablet Take 1 tablet (10 mg total) by mouth at bedtime.  NIACIN PO Take 1 tablet by mouth 3 (three) times daily.   rosuvastatin (CRESTOR) 40 MG tablet Take 1  tablet (40 mg total) by mouth daily.  sildenafil (REVATIO) 20 MG tablet Take 1-5 tablets (20-100 mg total) by mouth daily as needed.  sulfamethoxazole-trimethoprim (BACTRIM DS,SEPTRA DS) 800-160 MG tablet Take 1 tablet by mouth 2 (two) times daily. Patient not taking: Reported on 07/13/2017  tacrolimus (PROTOPIC) 0.1 % ointment Apply topically 2 (two) times daily.  thyroid (ARMOUR THYROID) 60 MG tablet 60 mg tab 1 tab po M, W, Th, F, Sun  thyroid [facility] THYROID) 90 MG tablet 90 mg  Tab Tuesday and Saturday. 60 mg tab M, W, Th, F, Sun  warfarin (COUMADIN) 4 MG tablet Take 1 tablet (4 mg total) by mouth daily.   Past Medical History:  Diagnosis Date  . Acid reflux 02/03/2016  . Allergy    seasonal  . Anxiety 12/01/2016  . Chronic anticoagulation 06/14/2013  . Coagulopathy ([place]) 05/16/2011  . Collagen vascular disease ([place])   . Cough 12/01/2016  . DVT (deep venous thrombosis) ([name])    takes coumadin  . Dysuria 02/27/2013  . Erectile dysfunction 12/28/2016  . 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 Hyperglycemia 12/26/2014  . Hyperlipidemia   . Hypothyroid 06/10/2011  . Insomnia 04/24/2016  . Left hip pain 12/17/2010  . MCI (mild cognitive impairment) 07/22/2016  . Nasal lesion 07/22/2016  . Polymyalgia ([place]) 03/21/2014  . 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 Preventative health care 12/17/2010  . Shingles 1982   right abdominal wall  . Staph skin infection 07/22/2016  .  Thyroid nodule 11/29/2014  . Urinary hesitancy 09/30/2016  . Vitamin D deficiency 10/14/2011   Social History   Socioeconomic History  . Marital status: Married    Spouse name: Not on file  . Number of children: Not on file  . Years of education: Not on file  . Highest education level: Not on file  Occupational History  . Not on file  Social Needs  . Financial resource strain: Not on file  . Food insecurity:    Worry: Not on file    Inability: Not on file  . Transportation needs:    Medical:  Not on file    Non-medical: Not on file  Tobacco Use  . Smoking status: Former Smoker    Packs/day: 1.00    Years: 20.00    Pack years: 20.00    Types: Cigarettes    Last attempt to quit: 03/03/1980    Years since quitting: 37.4  . Smokeless tobacco: Never Used  Substance and Sexual Activity  . Alcohol use: No    Alcohol/week: 0.0 oz  . Drug use: No  . Sexual activity: Not on file  Lifestyle  . Physical activity:    Days per week: Not on file    Minutes per session: Not on file  . Stress: Not on file  Relationships  . Social connections:    Talks on phone: Not on file    Gets together: Not on file    Attends religious service: Not on file    Active member of club or organization: Not on file    Attends meetings of clubs or organizations: Not on file    Relationship status: Not on file  Other Topics Concern  . Not on file  Social History Narrative  . Not on file   Family History  Problem Relation Age of Onset  . Cancer Mother        unknown- everywhere  . Cancer Sister        breast   ASSESSMENT Recent Results: Lab Results  Component Value Date   INR 1.9 (A) 08/10/2017   INR 1.6 (A) 08/05/2017   INR 2.7 07/20/2017   PROTIME 15.6 (H) 05/17/2013   Anticoagulation Dosing: Description   Dr. Beryle Ryan patient     INR today: Subtherapeutic  PLAN Weekly dose was increased by 6%, patient was instructed to continue enoxaparin until INR 2-3  Patient Instructions  Patient educated about medication as defined in this encounter and verbalized understanding by repeating back instructions provided.    Patient advised to contact clinic or seek medical attention if signs/symptoms of bleeding or thromboembolism occur.  Patient verbalized understanding by repeating back information and was advised to contact me if further medication-related questions arise. Patient was also provided an information handout.  Follow-up Return in about 3 days (around  08/13/2017).  Paul Ryan

## 2017-08-10 NOTE — Patient Instructions (Signed)
Patient educated about medication as defined in this encounter and verbalized understanding by repeating back instructions provided.   

## 2017-08-13 ENCOUNTER — Encounter: Payer: Self-pay | Admitting: Pharmacist

## 2017-08-13 DIAGNOSIS — I2699 Other pulmonary embolism without acute cor pulmonale: Secondary | ICD-10-CM

## 2017-08-13 DIAGNOSIS — D689 Coagulation defect, unspecified: Secondary | ICD-10-CM

## 2017-08-13 DIAGNOSIS — D6859 Other primary thrombophilia: Secondary | ICD-10-CM

## 2017-08-13 DIAGNOSIS — Z7901 Long term (current) use of anticoagulants: Secondary | ICD-10-CM

## 2017-08-13 DIAGNOSIS — I824Y9 Acute embolism and thrombosis of unspecified deep veins of unspecified proximal lower extremity: Secondary | ICD-10-CM

## 2017-08-14 LAB — POCT INR: INR: 2.4 (ref 2.0–3.0)

## 2017-08-14 NOTE — Progress Notes (Signed)
Reviewed thx DrG 

## 2017-08-14 NOTE — Progress Notes (Signed)
Expand All Collapse All   Anticoagulation Management Paul Ryan a 72 y.o.malewho reports to the clinic for monitoring of warfarintreatment.   Indication:Chronic anticoagulation [Z79.01], Coagulopathy ([place]) [[name]], DVT ([place]) [182.409], Pulmonary Embolism ([place]) [I126.99], Hereditary coagulopathy.  Duration:indefinite Supervising physician:James Penngrove   Anticoagulation Clinic Visit History: [name] notreport signs/symptoms of bleeding or thromboembolism.     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  b complex vitamins tablet Take 1 tablet by mouth daily.    cetirizine (ZYRTEC) 10 MG tablet TAKE 1 TABLET BY MOUTH EVERY DAY AS NEEDED FOR ALLERGIES  Cholecalciferol 4000 UNITS TABS Take 1 tablet by mouth daily.  Coenzyme Q10 (COQ10 PO) Take 2 tablets by mouth daily.  famotidine (PEPCID) 40 MG tablet Take 1 tablet (40 mg total) by mouth daily as needed for heartburn or indigestion.  Krill Oil 300 MG CAPS 1 cap daily  metFORMIN (GLUCOPHAGE) 500 MG tablet Take 1 tablet (500 mg total) by mouth 2 (two) times daily with a meal.  montelukast (SINGULAIR) 10 MG tablet Take 1 tablet (10 mg total) by mouth at bedtime.  NIACIN PO Take 1 tablet by mouth 3 (three) times daily.   rosuvastatin  (CRESTOR) 40 MG tablet Take 1 tablet (40 mg total) by mouth daily.  sildenafil (REVATIO) 20 MG tablet Take 1-5 tablets (20-100 mg total) by mouth daily as needed.  sulfamethoxazole-trimethoprim (BACTRIM DS,SEPTRA DS) 800-160 MG tablet Take 1 tablet by mouth 2 (two) times daily. Patient not taking: Reported on 07/13/2017  tacrolimus (PROTOPIC) 0.1 % ointment Apply topically 2 (two) times daily.  thyroid (ARMOUR THYROID) 60 MG tablet 60 mg tab 1 tab po M, W, Th, F, Sun  thyroid [facility] THYROID) 90 MG tablet 90 mg  Tab Tuesday and Saturday. 60 mg tab M, W, Th, F, Sun  warfarin (COUMADIN) 4 MG tablet Take 1 tablet (4 mg total) by mouth daily.   Past Medical History:  Diagnosis Date  . Acid reflux 02/03/2016  . Allergy    seasonal  . Anxiety 12/01/2016  . Chronic anticoagulation 06/14/2013  . Coagulopathy ([place]) 05/16/2011  . Collagen vascular disease ([place])   . Cough 12/01/2016  . DVT (deep venous thrombosis) ([name])    takes coumadin  . Dysuria 02/27/2013  . Erectile dysfunction 12/28/2016  . 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 Hyperglycemia 12/26/2014  . Hyperlipidemia   . Hypothyroid 06/10/2011  . Insomnia 04/24/2016  . Left hip pain 12/17/2010  . MCI (mild cognitive impairment) 07/22/2016  . Nasal lesion 07/22/2016  . Polymyalgia ([place]) 03/21/2014  . 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 Preventative health care 12/17/2010  . Shingles 1982   right abdominal wall  .  Staph skin infection 07/22/2016  . Thyroid nodule 11/29/2014  . Urinary hesitancy 09/30/2016  . Vitamin D deficiency 10/14/2011   Social History   Socioeconomic History  . Marital status: Married    Spouse name: Not on file  . Number of children: Not on file  . Years of education: Not on file  . Highest education level: Not on file  Occupational History  . Not on file  Social Needs  . Financial resource strain: Not on file  . Food insecurity:    Worry: Not on file    Inability: Not on file  .  Transportation needs:    Medical: Not on file    Non-medical: Not on file  Tobacco Use  . Smoking status: Former Smoker    Packs/day: 1.00    Years: 20.00    Pack years: 20.00    Types: Cigarettes    Last attempt to quit: 03/03/1980    Years since quitting: 37.4  . Smokeless tobacco: Never Used  Substance and Sexual Activity  . Alcohol use: No    Alcohol/week: 0.0 oz  . Drug use: No  . Sexual activity: Not on file  Lifestyle  . Physical activity:    Days per week: Not on file    Minutes per session: Not on file  . Stress: Not on file  Relationships  . Social connections:    Talks on phone: Not on file    Gets together: Not on file    Attends religious service: Not on file    Active member of club or organization: Not on file    Attends meetings of clubs or organizations: Not on file    Relationship status: Not on file  Other Topics Concern  . Not on file  Social History Narrative  . Not on file   Family History  Problem Relation Age of Onset  . Cancer Mother        unknown- everywhere  . Cancer Sister        breast   ASSESSMENT Lab Results  Component Value Date   INR 2.4 08/13/2017   INR 1.9 (A) 08/10/2017   INR 1.6 (A) 08/05/2017   PROTIME 15.6 (H) 05/17/2013   Anticoagulation Dosing: Description   Dr. Beryle Beams patient     INR today: Therapeutic  PLAN Weekly dose was unchanged, enoxaparin was discontinued  There are no Patient Instructions on file for this visit. Patient advised to contact clinic or seek medical attention if signs/symptoms of bleeding or thromboembolism occur.  Patient verbalized understanding by repeating back information and was advised to contact me if further medication-related questions arise. Patient was also provided an information handout.  Follow-up Return in about 1 week (around 08/20/2017).  [name]

## 2017-08-17 ENCOUNTER — Ambulatory Visit: Payer: Medicare Other | Admitting: Pharmacist

## 2017-08-18 LAB — PROTIME-INR

## 2017-08-20 ENCOUNTER — Encounter: Payer: Self-pay | Admitting: Pharmacist

## 2017-08-20 DIAGNOSIS — Z7901 Long term (current) use of anticoagulants: Secondary | ICD-10-CM

## 2017-08-20 DIAGNOSIS — I2699 Other pulmonary embolism without acute cor pulmonale: Secondary | ICD-10-CM

## 2017-08-20 DIAGNOSIS — D689 Coagulation defect, unspecified: Secondary | ICD-10-CM

## 2017-08-20 DIAGNOSIS — I824Y9 Acute embolism and thrombosis of unspecified deep veins of unspecified proximal lower extremity: Secondary | ICD-10-CM

## 2017-08-20 DIAGNOSIS — D6859 Other primary thrombophilia: Secondary | ICD-10-CM

## 2017-08-20 LAB — POCT INR: INR: 3.1 — AB (ref 2.0–3.0)

## 2017-08-20 NOTE — Progress Notes (Signed)
Anticoagulation Management Paul Ryan a 72 y.o.malewho reports to the clinic for monitoring of warfarintreatment.   Indication:Chronic anticoagulation [Z79.01], Coagulopathy ([place]) [[name]], DVT ([place]) [182.409], Pulmonary Embolism ([place]) [I126.99], Hereditary coagulopathy.  Duration:indefinite Supervising physician:James [place]   Anticoagulation Clinic Visit History: [name] notreport signs/symptoms of bleeding or thromboembolism.  Anticoagulation Episode Summary    Current INR goal:   2.0-3.0  TTR:   71.0 % (3 y)  Next INR check:   08/27/2017  INR from last check:   3.1! 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  b complex vitamins tablet Take 1 tablet by mouth daily.    cetirizine (ZYRTEC) 10 MG tablet TAKE 1 TABLET BY MOUTH EVERY DAY AS NEEDED FOR ALLERGIES  Cholecalciferol 4000 UNITS TABS Take 1 tablet by mouth daily.  Coenzyme Q10 (COQ10 PO) Take 2 tablets by mouth daily.  famotidine (PEPCID) 40 MG tablet Take 1 tablet (40 mg total) by mouth daily as needed for heartburn or indigestion.  Krill Oil 300 MG CAPS 1 cap daily  metFORMIN (GLUCOPHAGE) 500 MG tablet Take 1 tablet (500 mg total) by mouth 2 (two) times daily with a meal.  montelukast (SINGULAIR) 10 MG tablet Take 1 tablet (10 mg total) by mouth at bedtime.  NIACIN PO Take 1 tablet by mouth 3 (three) times daily.   rosuvastatin (CRESTOR) 40 MG tablet Take 1 tablet  (40 mg total) by mouth daily.  sildenafil (REVATIO) 20 MG tablet Take 1-5 tablets (20-100 mg total) by mouth daily as needed.  sulfamethoxazole-trimethoprim (BACTRIM DS,SEPTRA DS) 800-160 MG tablet Take 1 tablet by mouth 2 (two) times daily. Patient not taking: Reported on 07/13/2017  tacrolimus (PROTOPIC) 0.1 % ointment Apply topically 2 (two) times daily.  thyroid (ARMOUR THYROID) 60 MG tablet 60 mg tab 1 tab po M, W, Th, F, Sun  thyroid [facility] THYROID) 90 MG tablet 90 mg  Tab Tuesday and Saturday. 60 mg tab M, W, Th, F, Sun  warfarin (COUMADIN) 4 MG tablet Take 1 tablet (4 mg total) by mouth daily.   Past Medical History:  Diagnosis Date  . Acid reflux 02/03/2016  . Allergy    seasonal  . Anxiety 12/01/2016  . Chronic anticoagulation 06/14/2013  . Coagulopathy ([place]) 05/16/2011  . Collagen vascular disease ([place])   . Cough 12/01/2016  . DVT (deep venous thrombosis) ([name])    takes coumadin  . Dysuria 02/27/2013  . Erectile dysfunction 12/28/2016  . 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 Hyperglycemia 12/26/2014  . Hyperlipidemia   . Hypothyroid 06/10/2011  . Insomnia 04/24/2016  . Left hip pain 12/17/2010  . MCI (mild cognitive impairment) 07/22/2016  . Nasal lesion 07/22/2016  . Polymyalgia ([place]) 03/21/2014  . 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 Preventative health care 12/17/2010  . Shingles 1982   right abdominal wall  . Staph skin infection 07/22/2016  . Thyroid nodule  11/29/2014  . Urinary hesitancy 09/30/2016  . Vitamin D deficiency 10/14/2011   Social History   Socioeconomic History  . Marital status: Married    Spouse name: Not on file  . Number of children: Not on file  . Years of education: Not on file  . Highest education level: Not on file  Occupational History  . Not on file  Social Needs  . Financial resource strain: Not on file  . Food insecurity:    Worry: Not on file    Inability: Not on file  . Transportation needs:    Medical: Not on  file    Non-medical: Not on file  Tobacco Use  . Smoking status: Former Smoker    Packs/day: 1.00    Years: 20.00    Pack years: 20.00    Types: Cigarettes    Last attempt to quit: 03/03/1980    Years since quitting: 37.4  . Smokeless tobacco: Never Used  Substance and Sexual Activity  . Alcohol use: No    Alcohol/week: 0.0 oz  . Drug use: No  . Sexual activity: Not on file  Lifestyle  . Physical activity:    Days per week: Not on file    Minutes per session: Not on file  . Stress: Not on file  Relationships  . Social connections:    Talks on phone: Not on file    Gets together: Not on file    Attends religious service: Not on file    Active member of club or organization: Not on file    Attends meetings of clubs or organizations: Not on file    Relationship status: Not on file  Other Topics Concern  . Not on file  Social History Narrative  . Not on file   Family History  Problem Relation Age of Onset  . Cancer Mother        unknown- everywhere  . Cancer Sister        breast   ASSESSMENT Recent Results: Lab Results  Component Value Date   INR 3.1 (A) 08/20/2017   INR 2.4 08/13/2017   INR 1.9 (A) 08/10/2017   PROTIME 15.6 (H) 05/17/2013   Anticoagulation Dosing: Description   Dr. Beryle Beams patient     INR today: slightly Supratherapeutic  PLAN Weekly dose was unchanged   There are no Patient Instructions on file for this visit. Patient advised to contact clinic or seek medical attention if signs/symptoms of bleeding or thromboembolism occur.  Patient verbalized understanding by repeating back information and was advised to contact me if further medication-related questions arise. Patient was also provided an information handout.  Follow-up 1 week  [name]

## 2017-08-20 NOTE — Progress Notes (Signed)
Reviewed & agree repeat in 1 week; no dose change today. thx DrG

## 2017-08-28 LAB — POCT INR: INR: 3.5 — AB (ref 2.0–3.0)

## 2017-08-31 ENCOUNTER — Encounter: Payer: Self-pay | Admitting: Pharmacist

## 2017-08-31 DIAGNOSIS — I824Y9 Acute embolism and thrombosis of unspecified deep veins of unspecified proximal lower extremity: Secondary | ICD-10-CM

## 2017-08-31 DIAGNOSIS — I2699 Other pulmonary embolism without acute cor pulmonale: Secondary | ICD-10-CM

## 2017-08-31 DIAGNOSIS — D6859 Other primary thrombophilia: Secondary | ICD-10-CM

## 2017-08-31 DIAGNOSIS — Z7901 Long term (current) use of anticoagulants: Secondary | ICD-10-CM

## 2017-08-31 DIAGNOSIS — D689 Coagulation defect, unspecified: Secondary | ICD-10-CM

## 2017-08-31 NOTE — Progress Notes (Signed)
Reviewed Thanks I would be inclined to just repeat in a week and not change dose DrG

## 2017-08-31 NOTE — Progress Notes (Signed)
Anticoagulation Management Obinna Ehresman a 72 y.o.malewho is being monitored for warfarin therapy.   Indication:Chronic anticoagulation [Z79.01], Coagulopathy ([place]) [[name]], DVT ([place]) [182.409], Pulmonary Embolism ([place]) [I126.99], Hereditary coagulopathy.  Duration:indefinite Supervising physician:James [place]   Anticoagulation Clinic Visit History: [name] notreport signs/symptoms of bleeding or thromboembolism.  Anticoagulation Episode Summary    Current INR goal:   2.0-3.0  TTR:   70.6 % (3 y)  Next INR check:   09/07/2017  INR from last check:   3.5! 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  b complex vitamins tablet Take 1 tablet by mouth daily.    cetirizine (ZYRTEC) 10 MG tablet TAKE 1 TABLET BY MOUTH EVERY DAY AS NEEDED FOR ALLERGIES  Cholecalciferol 4000 UNITS TABS Take 1 tablet by mouth daily.  Coenzyme Q10 (COQ10 PO) Take 2 tablets by mouth daily.  famotidine (PEPCID) 40 MG tablet Take 1 tablet (40 mg total) by mouth daily as needed for heartburn or indigestion.  Krill Oil 300 MG CAPS 1 cap daily  metFORMIN (GLUCOPHAGE) 500 MG tablet Take 1 tablet (500 mg total) by mouth 2 (two) times daily with a meal.  montelukast (SINGULAIR) 10 MG tablet Take 1 tablet (10 mg total) by mouth at bedtime.  NIACIN PO Take 1 tablet by mouth 3 (three) times daily.   rosuvastatin (CRESTOR) 40 MG tablet Take 1 tablet (40 mg total) by  mouth daily.  sildenafil (REVATIO) 20 MG tablet Take 1-5 tablets (20-100 mg total) by mouth daily as needed.  sulfamethoxazole-trimethoprim (BACTRIM DS,SEPTRA DS) 800-160 MG tablet Take 1 tablet by mouth 2 (two) times daily. Patient not taking: Reported on 07/13/2017  tacrolimus (PROTOPIC) 0.1 % ointment Apply topically 2 (two) times daily.  thyroid (ARMOUR THYROID) 60 MG tablet 60 mg tab 1 tab po M, W, Th, F, Sun  thyroid [facility] THYROID) 90 MG tablet 90 mg  Tab Tuesday and Saturday. 60 mg tab M, W, Th, F, Sun  warfarin (COUMADIN) 4 MG tablet Take 1 tablet (4 mg total) by mouth daily.   Past Medical History:  Diagnosis Date  . Acid reflux 02/03/2016  . Allergy    seasonal  . Anxiety 12/01/2016  . Chronic anticoagulation 06/14/2013  . Coagulopathy ([place]) 05/16/2011  . Collagen vascular disease ([place])   . Cough 12/01/2016  . DVT (deep venous thrombosis) ([name])    takes coumadin  . Dysuria 02/27/2013  . Erectile dysfunction 12/28/2016  . 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 Hyperglycemia 12/26/2014  . Hyperlipidemia   . Hypothyroid 06/10/2011  . Insomnia 04/24/2016  . Left hip pain 12/17/2010  . MCI (mild cognitive impairment) 07/22/2016  . Nasal lesion 07/22/2016  . Polymyalgia ([place]) 03/21/2014  . 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 Preventative health care 12/17/2010  . Shingles 1982   right abdominal wall  . Staph skin infection 07/22/2016  . Thyroid nodule 11/29/2014  .  Urinary hesitancy 09/30/2016  . Vitamin D deficiency 10/14/2011   Social History   Socioeconomic History  . Marital status: Married    Spouse name: Not on file  . Number of children: Not on file  . Years of education: Not on file  . Highest education level: Not on file  Occupational History  . Not on file  Social Needs  . Financial resource strain: Not on file  . Food insecurity:    Worry: Not on file    Inability: Not on file  . Transportation needs:    Medical: Not on file     Non-medical: Not on file  Tobacco Use  . Smoking status: Former Smoker    Packs/day: 1.00    Years: 20.00    Pack years: 20.00    Types: Cigarettes    Last attempt to quit: 03/03/1980    Years since quitting: 37.5  . Smokeless tobacco: Never Used  Substance and Sexual Activity  . Alcohol use: No    Alcohol/week: 0.0 oz  . Drug use: No  . Sexual activity: Not on file  Lifestyle  . Physical activity:    Days per week: Not on file    Minutes per session: Not on file  . Stress: Not on file  Relationships  . Social connections:    Talks on phone: Not on file    Gets together: Not on file    Attends religious service: Not on file    Active member of club or organization: Not on file    Attends meetings of clubs or organizations: Not on file    Relationship status: Not on file  Other Topics Concern  . Not on file  Social History Narrative  . Not on file   Family History  Problem Relation Age of Onset  . Cancer Mother        unknown- everywhere  . Cancer Sister        breast   ASSESSMENT Recent Results: Lab Results  Component Value Date   INR 3.5 (A) 08/27/2017   INR 3.1 (A) 08/20/2017   INR 2.4 08/13/2017   PROTIME 15.6 (H) 05/17/2013   Anticoagulation Dosing: Description   Dr. Beryle Beams patient     INR today: Supratherapeutic  PLAN Weekly dose was decreased by 6%, re-check INR 1 week  There are no Patient Instructions on file for this visit. Patient advised to contact clinic or seek medical attention if signs/symptoms of bleeding or thromboembolism occur.  Patient verbalized understanding by repeating back information and was advised to contact me if further medication-related questions arise. Patient was also provided an information handout.  Follow-up Return in about 1 week (around 09/07/2017).  [name]

## 2017-09-01 LAB — PROTIME-INR

## 2017-09-08 LAB — POCT INR: INR: 2.6 (ref 2.0–3.0)

## 2017-09-09 ENCOUNTER — Encounter: Payer: Self-pay | Admitting: Pharmacist

## 2017-09-09 DIAGNOSIS — D689 Coagulation defect, unspecified: Secondary | ICD-10-CM

## 2017-09-09 DIAGNOSIS — Z7901 Long term (current) use of anticoagulants: Secondary | ICD-10-CM

## 2017-09-09 DIAGNOSIS — I824Y9 Acute embolism and thrombosis of unspecified deep veins of unspecified proximal lower extremity: Secondary | ICD-10-CM

## 2017-09-09 DIAGNOSIS — D6859 Other primary thrombophilia: Secondary | ICD-10-CM

## 2017-09-09 DIAGNOSIS — I2699 Other pulmonary embolism without acute cor pulmonale: Secondary | ICD-10-CM

## 2017-09-09 NOTE — Progress Notes (Addendum)
Anticoagulation Management Paul Ryan a 72 y.o.malewho is being monitored for warfarin therapy.   Indication:Chronic anticoagulation [Z79.01], Coagulopathy ([place]) [[name]], DVT ([place]) [182.409], Pulmonary Embolism ([place]) [I126.99], Hereditary coagulopathy.  Duration:indefinite Supervising physician:James [place]  INR 2.6 09/08/17   No Known Allergies Medication Sig  albuterol (PROVENTIL HFA;VENTOLIN HFA) 108 (90 Base) MCG/ACT inhaler Inhale 2 puffs into the lungs every 4 (four) hours as needed for wheezing or shortness of breath.  b complex vitamins tablet Take 1 tablet by mouth daily.    cetirizine (ZYRTEC) 10 MG tablet TAKE 1 TABLET BY MOUTH EVERY DAY AS NEEDED FOR ALLERGIES  Cholecalciferol 4000 UNITS TABS Take 1 tablet by mouth daily.  Coenzyme Q10 (COQ10 PO) Take 2 tablets by mouth daily.  famotidine (PEPCID) 40 MG tablet Take 1 tablet (40 mg total) by mouth daily as needed for heartburn or indigestion.  Krill Oil 300 MG CAPS 1 cap daily  metFORMIN (GLUCOPHAGE) 500 MG tablet Take 1 tablet (500 mg total) by mouth 2 (two) times daily with a meal.  montelukast (SINGULAIR) 10 MG tablet Take 1 tablet (10 mg total) by mouth at bedtime.  NIACIN PO Take 1 tablet by mouth 3 (three) times daily.   rosuvastatin (CRESTOR) 40 MG tablet Take 1 tablet (40 mg total) by mouth daily.  sildenafil (REVATIO) 20 MG tablet Take 1-5 tablets (20-100 mg total) by mouth daily as needed.  sulfamethoxazole-trimethoprim (BACTRIM DS,SEPTRA DS) 800-160 MG tablet Take 1 tablet by mouth 2 (two) times daily. Patient not taking: Reported on 07/13/2017  tacrolimus (PROTOPIC) 0.1 % ointment Apply topically 2 (two) times daily.  thyroid (ARMOUR THYROID) 60 MG tablet 60 mg tab 1 tab po M, W, Th, F, Sun  thyroid [facility] THYROID) 90 MG tablet 90 mg  Tab Tuesday and Saturday. 60 mg tab M, W, Th, F, Sun  warfarin (COUMADIN) 4 MG tablet Take 1 tablet (4 mg total) by mouth daily.   Past Medical History:  Diagnosis  Date  . Acid reflux 02/03/2016  . Allergy    seasonal  . Anxiety 12/01/2016  . Chronic anticoagulation 06/14/2013  . Coagulopathy ([place]) 05/16/2011  . Collagen vascular disease ([place])   . Cough 12/01/2016  . DVT (deep venous thrombosis) ([name])    takes coumadin  . Dysuria 02/27/2013  . Erectile dysfunction 12/28/2016  . 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 Hyperglycemia 12/26/2014  . Hyperlipidemia   . Hypothyroid 06/10/2011  . Insomnia 04/24/2016  . Left hip pain 12/17/2010  . MCI (mild cognitive impairment) 07/22/2016  . Nasal lesion 07/22/2016  . Polymyalgia ([place]) 03/21/2014  . 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 Preventative health care 12/17/2010  . Shingles 1982   right abdominal wall  . Staph skin infection 07/22/2016  . Thyroid nodule 11/29/2014  . Urinary hesitancy 09/30/2016  . Vitamin D deficiency 10/14/2011   Social History   Socioeconomic History  . Marital status: Married    Spouse name: Not on file  . Number of children: Not on file  . Years of education: Not on file  . Highest education level: Not on file  Occupational History  . Not on file  Social Needs  . Financial resource strain: Not on file  . Food insecurity:    Worry: Not on file    Inability: Not on file  . Transportation needs:    Medical: Not on file    Non-medical: Not on file  Tobacco Use  . Smoking status: Former Smoker    Packs/day: 1.00  Years: 20.00    Pack years: 20.00    Types: Cigarettes    Last attempt to quit: 03/03/1980    Years since quitting: 37.5  . Smokeless tobacco: Never Used  Substance and Sexual Activity  . Alcohol use: No    Alcohol/week: 0.0 oz  . Drug use: No  . Sexual activity: Not on file  Lifestyle  . Physical activity:    Days per week: Not on file    Minutes per session: Not on file  . Stress: Not on file  Relationships  . Social connections:    Talks on phone: Not on file    Gets together: Not on file    Attends religious service:  Not on file    Active member of club or organization: Not on file    Attends meetings of clubs or organizations: Not on file    Relationship status: Not on file  Other Topics Concern  . Not on file  Social History Narrative  . Not on file   Family History  Problem Relation Age of Onset  . Cancer Mother        unknown- everywhere  . Cancer Sister        breast   ASSESSMENT Recent Results: Lab Results  Component Value Date   INR 2.6 09/08/2017   INR 3.5 (A) 08/28/2017   INR 3.1 (A) 08/20/2017   PROTIME 15.6 (H) 05/17/2013   Anticoagulation Dosing: Description   Dr. Beryle Beams patient     INR today: Therapeutic  PLAN Weekly dose was unchanged  There are no Patient Instructions on file for this visit. Patient advised to contact clinic or seek medical attention if signs/symptoms of bleeding or thromboembolism occur.  Patient verbalized understanding by repeating back information and was advised to contact me if further medication-related questions arise. Patient was also provided an information handout.  Follow-up 1 week  [name]

## 2017-09-09 NOTE — Progress Notes (Signed)
Reviewed thx DrG 

## 2017-09-14 ENCOUNTER — Other Ambulatory Visit: Payer: Self-pay

## 2017-09-15 ENCOUNTER — Ambulatory Visit: Payer: Medicare Other | Admitting: Pharmacist

## 2017-09-15 LAB — PROTIME-INR

## 2017-09-17 ENCOUNTER — Ambulatory Visit (INDEPENDENT_AMBULATORY_CARE_PROVIDER_SITE_OTHER): Payer: Medicare Other | Admitting: Pharmacist

## 2017-09-17 ENCOUNTER — Encounter: Payer: Self-pay | Admitting: Family Medicine

## 2017-09-17 ENCOUNTER — Other Ambulatory Visit: Payer: Self-pay | Admitting: Family Medicine

## 2017-09-17 DIAGNOSIS — Z5181 Encounter for therapeutic drug level monitoring: Secondary | ICD-10-CM

## 2017-09-17 DIAGNOSIS — D689 Coagulation defect, unspecified: Secondary | ICD-10-CM | POA: Diagnosis not present

## 2017-09-17 DIAGNOSIS — I2699 Other pulmonary embolism without acute cor pulmonale: Secondary | ICD-10-CM | POA: Diagnosis not present

## 2017-09-17 DIAGNOSIS — I82409 Acute embolism and thrombosis of unspecified deep veins of unspecified lower extremity: Secondary | ICD-10-CM | POA: Diagnosis not present

## 2017-09-17 DIAGNOSIS — I824Y9 Acute embolism and thrombosis of unspecified deep veins of unspecified proximal lower extremity: Secondary | ICD-10-CM

## 2017-09-17 DIAGNOSIS — D6859 Other primary thrombophilia: Secondary | ICD-10-CM

## 2017-09-17 DIAGNOSIS — Z7901 Long term (current) use of anticoagulants: Secondary | ICD-10-CM | POA: Diagnosis not present

## 2017-09-17 LAB — POCT INR: INR: 3 (ref 2.0–3.0)

## 2017-09-17 MED ORDER — METRONIDAZOLE 500 MG PO TABS: 500.0000 mg | ORAL_TABLET | Freq: Three times a day (TID) | ORAL | 0 refills | Status: AC

## 2017-09-17 NOTE — Progress Notes (Addendum)
Anticoagulation Management Paul Ryan is a 72 y.o. male who reports to the clinic for monitoring of warfarin treatment.    Indication: Chronic anticoagulation [Z79.01], Coagulopathy ([name]) [[name]], DVT ([place]) [182.409], Pulmonary Embolism ([place]) [I126.99], Hereditary coagulopathy.  Duration: indefinite Supervising physician: Paul Ryan  INR 3.0 09/17/2017  Anticoagulation Clinic Visit History: Patient does not report signs/symptoms of bleeding or thromboembolism. 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  b complex vitamins tablet Take 1 tablet by mouth daily.    cetirizine (ZYRTEC) 10 MG tablet TAKE 1 TABLET BY MOUTH EVERY DAY AS NEEDED FOR ALLERGIES  Cholecalciferol 4000 UNITS TABS Take 1 tablet by mouth daily.  Coenzyme Q10 (COQ10 PO) Take 2 tablets by mouth daily.  famotidine (PEPCID) 40 MG tablet Take 1 tablet (40 mg total) by mouth daily as needed for heartburn or indigestion.  montelukast (SINGULAIR) 10 MG tablet Take 1 tablet (10 mg total) by mouth at bedtime.  NIACIN PO Take 1 tablet by mouth 3 (three) times daily.   rosuvastatin (CRESTOR) 40 MG tablet Take 1 tablet (40 mg total) by mouth daily.  sildenafil (REVATIO) 20 MG tablet Take 1-5 tablets  (20-100 mg total) by mouth daily as needed.  sulfamethoxazole-trimethoprim (BACTRIM DS,SEPTRA DS) 800-160 MG tablet Take 1 tablet by mouth 2 (two) times daily. Patient not taking: Reported on 07/13/2017  tacrolimus (PROTOPIC) 0.1 % ointment Apply topically 2 (two) times daily.  thyroid (ARMOUR THYROID) 60 MG tablet 60 mg tab 1 tab po M, W, Th, F, Sun  thyroid [facility] THYROID) 90 MG tablet 90 mg  Tab Tuesday and Saturday. 60 mg tab M, W, Th, F, Sun  warfarin (COUMADIN) 4 MG tablet Take 1 tablet (4 mg total) by mouth daily.   Past Medical History:  Diagnosis Date  . Acid reflux 02/03/2016  . Allergy    seasonal  . Anxiety 12/01/2016  . Chronic anticoagulation 06/14/2013  . Coagulopathy ([place]) 05/16/2011  . Collagen vascular disease ([place])   . Cough 12/01/2016  . DVT (deep venous thrombosis) ([name])    takes coumadin  . Dysuria 02/27/2013  . Erectile dysfunction 12/28/2016  . 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 Hyperglycemia 12/26/2014  . Hyperlipidemia   . Hypothyroid 06/10/2011  . Insomnia 04/24/2016  . Left hip pain 12/17/2010  . MCI (mild cognitive impairment) 07/22/2016  . Nasal lesion 07/22/2016  . Polymyalgia ([place]) 03/21/2014  . 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 Preventative health care 12/17/2010  . Shingles 1982   right abdominal wall  . Staph skin infection 07/22/2016  . Thyroid nodule 11/29/2014  . Urinary hesitancy 09/30/2016  . Vitamin D deficiency  10/14/2011   Social History   Socioeconomic History  . Marital status: Married    Spouse name: Not on file  . Number of children: Not on file  . Years of education: Not on file  . Highest education level: Not on file  Occupational History  . Not on file  Social Needs  . Financial resource strain: Not on file  . Food insecurity:    Worry: Not on file    Inability: Not on file  . Transportation needs:    Medical: Not on file    Non-medical: Not on file  Tobacco Use  . Smoking status: Former Smoker     Packs/day: 1.00    Years: 20.00    Pack years: 20.00    Types: Cigarettes    Last attempt to quit: 03/03/1980    Years since quitting: 37.5  . Smokeless tobacco: Never Used  Substance and Sexual Activity  . Alcohol use: No    Alcohol/week: 0.0 oz  . Drug use: No  . Sexual activity: Not on file  Lifestyle  . Physical activity:    Days per week: Not on file    Minutes per session: Not on file  . Stress: Not on file  Relationships  . Social connections:    Talks on phone: Not on file    Gets together: Not on file    Attends religious service: Not on file    Active member of club or organization: Not on file    Attends meetings of clubs or organizations: Not on file    Relationship status: Not on file  Other Topics Concern  . Not on file  Social History Narrative  . Not on file   Family History  Problem Relation Age of Onset  . Cancer Mother        unknown- everywhere  . Cancer Sister        breast    ASSESSMENT Recent Results: Lab Results  Component Value Date   INR 3.0 09/17/2017   INR 2.6 09/08/2017   INR 3.5 (A) 08/28/2017   PROTIME 15.6 (H) 05/17/2013    Anticoagulation Dosing: Description   Dr. Beryle Beams patient     INR today: Therapeutic  PLAN Weekly dose was unchanged   There are no Patient Instructions on file for this visit. Patient advised to contact clinic or seek medical attention if signs/symptoms of bleeding or thromboembolism occur.  Patient verbalized understanding by repeating back information and was advised to contact me if further medication-related questions arise. Patient was also provided an information handout.  Follow-up 2 weeks  Paul Ryan   Patient was seen in clinic with Paul Ryan, PharmD candidate. I agree with the assessment and plan of care documented.

## 2017-09-17 NOTE — Progress Notes (Signed)
Reviewed thx DrG 

## 2017-09-17 NOTE — Addendum Note (Signed)
Addended by: Forde Dandy on: 09/17/2017 04:05 PM   Modules accepted: Level of Service

## 2017-09-21 ENCOUNTER — Other Ambulatory Visit: Payer: Medicare Other

## 2017-09-21 ENCOUNTER — Encounter: Payer: Self-pay | Admitting: Neurology

## 2017-09-21 ENCOUNTER — Ambulatory Visit: Payer: Medicare Other | Admitting: Neurology

## 2017-09-21 VITALS — BP 100/58 | HR 63 | Ht 71.0 in | Wt 161.0 lb

## 2017-09-21 DIAGNOSIS — R413 Other amnesia: Secondary | ICD-10-CM

## 2017-09-21 DIAGNOSIS — G3184 Mild cognitive impairment, so stated: Secondary | ICD-10-CM | POA: Diagnosis not present

## 2017-09-21 LAB — TSH: TSH: 0.23 m[IU]/L — AB (ref 0.40–4.50)

## 2017-09-21 LAB — VITAMIN B12: Vitamin B-12: 573 pg/mL (ref 200–1100)

## 2017-09-21 NOTE — Progress Notes (Signed)
NEUROLOGY CONSULTATION NOTE  Paul Ryan MRN: [id number] DOB: February 28, 1946  Referring provider: Dr. Penni Homans Primary care provider: Dr. Penni Homans  Reason for consult:  Memory loss  Dear Dr Charlett Blake:  Thank you for your kind referral of Paul Ryan for consultation of the above symptoms. Although his history is well known to you, please allow me to reiterate it for the purpose of our medical record. He is alone in the office today. Records and images were personally reviewed where available.  HISTORY OF PRESENT ILLNESS: This is a pleasant 72 year old right-handed man with a history of hypothyroidism, diet-controlled hyperlipidemia, DVT on chronic anticoagulation with Coumadin, presenting for evaluation of worsening memory. He started noticing changes around 2011 and reports having a neurological evaluation with Dr. Morene Antu, MRI brain at that time showed "3 white spots that have not grown." He underwent Neuropsychological testing with Dr. Valentina Shaggy on 04/19/2009, records unavailable for review, it appears he was diagnosed with Mild Cognitive Impairment. He was unhappy with testing, patient states he was told things he already knew, that his multitasking and comprehension have declined. He states he has always had dyslexia. He states "I don't need any tests to tell me I'm getting dumber." He has noticed continued worsening since then. 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 He recently got back from [place] doing lectures. He states that he is highly educated from good schools in [place] and [place], but now he is having difficulty comprehending things and sometimes difficulty spelling words. He takes a long time to focus. He noticed prior to retiring from teaching that he would have to review his notes every morning for a class he has been teaching for 40 years. He has noticed more problems reading, after 4 minutes he cannot  comprehend what he read and has to read it again. He has to tell himself to pay attention. He feels the issue is both with remembering the story but also comprehending it. He would be in the middle of watching a 1.5-hour documentary and would not recall the beginning of it. He has had difficulty doing math, he could not figure out when to take his Coumadin with the time difference from [place] and had to call his daughter. He has to make a list of everything. His wife tells he has told her the same thing 3 times. He occasionally misses a dose of Coumadin. He denies getting lost driving. His wife has always been in charge of finances.  He reports his wife was recently dealing with a cancer diagnosis, but states that the memory issues are not due to stress, as they have been ongoing even before his wife's illness. He stopped his Crestor after reading about potential issues of higher doses in Asians. His TSH was low in April and dose was adjusted, but he feels his thyroid is out of whack again. He has occasional tremors when he is under stress, he was at [employer] in the airport and had to ask someone to fill out the forms. He denies any headaches, dizziness, diplopia, dysarthria/dysphagia, neck/back pain, focal numbness/tingling/weakness, bowel/bladder dysfunction, anosmia. No family history of dementia. No history of significant head injuries. He drinks alcohol socially.   Laboratory Data: Lab Results  Component Value Date   TSH 0.12 (L) 06/16/2017   PAST MEDICAL HISTORY: Past Medical History:  Diagnosis Date  . Acid reflux 02/03/2016  . Allergy  seasonal  . Anxiety 12/01/2016  . Chronic anticoagulation 06/14/2013  . 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 Collagen vascular disease ([place])   . Cough 12/01/2016  . DVT (deep venous thrombosis) ([name])    takes coumadin  . Dysuria 02/27/2013  . Erectile dysfunction 12/28/2016  . 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 Hyperglycemia 12/26/2014  .  Hyperlipidemia   . Hypothyroid 06/10/2011  . Insomnia 04/24/2016  . Left hip pain 12/17/2010  . MCI (mild cognitive impairment) 07/22/2016  . Nasal lesion 07/22/2016  . Polymyalgia ([place]) 03/21/2014  . 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 Preventative health care 12/17/2010  . Shingles 1982   right abdominal wall  . Staph skin infection 07/22/2016  . Thyroid nodule 11/29/2014  . Urinary hesitancy 09/30/2016  . Vitamin D deficiency 10/14/2011    PAST SURGICAL HISTORY: Past Surgical History:  Procedure Laterality Date  . 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 b complex vitamins tablet Take 1 tablet by mouth daily.      . cetirizine (ZYRTEC) 10 MG tablet TAKE 1 TABLET BY MOUTH EVERY DAY AS NEEDED FOR ALLERGIES 30 tablet 4  . Cholecalciferol 4000 UNITS TABS Take 1 tablet by mouth daily.    . Coenzyme Q10 (COQ10 PO) Take 2 tablets by mouth daily.    . metroNIDAZOLE (FLAGYL) 500 MG tablet Take 1 tablet (500 mg total) by mouth 3 (three) times daily for 10 days. 30 tablet 0  . montelukast (SINGULAIR) 10 MG tablet Take 1 tablet (10 mg total) by mouth at bedtime. 90 tablet 3  . NIACIN PO Take 1 tablet by mouth 3 (three) times daily.     . NON FORMULARY [facility] immune support    . Red Yeast Rice Extract (RED YEAST RICE PO) Take 1,200 mg by mouth 2 (two) times daily at 10 AM and 5 PM.    . tacrolimus (PROTOPIC) 0.1 % ointment Apply topically 2 (two) times daily. 100 g 1  . thyroid (ARMOUR THYROID) 60 MG tablet 60 mg tab 1 tab po M, W, Th, F, Sun 75 tablet 1  . thyroid (ARMOUR THYROID) 90 MG tablet 90 mg  Tab Tuesday and Saturday. 60 mg tab M, W, Th, F, Sun 30 tablet 1  . TURMERIC PO Take 1,200 mg by mouth daily.    [name] Kitchen warfarin (COUMADIN) 4 MG tablet Take 1 tablet (4 mg total) by mouth daily. 90 tablet 3   Current Facility-Administered Medications on File Prior to Visit    Medication Dose Route Frequency Provider Last Rate Last Dose  . triamcinolone acetonide (KENALOG) 10 MG/ML injection 10 mg  10 mg Other Once Trula Slade, DPM        ALLERGIES: No Known Allergies  FAMILY HISTORY: Family History  Problem Relation Age of Onset  . Cancer Mother        unknown- everywhere  . Cancer Sister        breast    SOCIAL HISTORY: Social History   Socioeconomic History  . Marital status: Married    Spouse name: Not on file  . Number of children: Not on file  . Years of education: Not on file  . Highest education level: Not on file  Occupational History  . Not on file  Social Needs  . Financial resource strain: Not on file  . Food insecurity:  Worry: Not on file    Inability: Not on file  . Transportation needs:    Medical: Not on file    Non-medical: Not on file  Tobacco Use  . Smoking status: Former Smoker    Packs/day: 1.00    Years: 20.00    Pack years: 20.00    Types: Cigarettes    Last attempt to quit: 03/03/1980    Years since quitting: 37.5  . Smokeless tobacco: Never Used  Substance and Sexual Activity  . Alcohol use: No    Alcohol/week: 0.0 oz  . Drug use: No  . Sexual activity: Not on file  Lifestyle  . Physical activity:    Days per week: Not on file    Minutes per session: Not on file  . Stress: Not on file  Relationships  . Social connections:    Talks on phone: Not on file    Gets together: Not on file    Attends religious service: Not on file    Active member of club or organization: Not on file    Attends meetings of clubs or organizations: Not on file    Relationship status: Not on file  . Intimate partner violence:    Fear of current or ex partner: Not on file    Emotionally abused: Not on file    Physically abused: Not on file    Forced sexual activity: Not on file  Other Topics Concern  . Not on file  Social History Narrative  . Not on file    REVIEW OF SYSTEMS: Constitutional: No fevers, chills,  or sweats, no generalized fatigue, change in appetite Eyes: No visual changes, double vision, eye pain Ear, nose and throat: No hearing loss, ear pain, nasal congestion, sore throat Cardiovascular: No chest pain, palpitations Respiratory:  No shortness of breath at rest or with exertion, wheezes GastrointestinaI: No nausea, vomiting, diarrhea, abdominal pain, fecal incontinence Genitourinary:  No dysuria, urinary retention or frequency Musculoskeletal:  No neck pain, back pain Integumentary: No rash, pruritus, skin lesions Neurological: as above Psychiatric: No depression, insomnia, anxiety Endocrine: No palpitations, fatigue, diaphoresis, mood swings, change in appetite, change in weight, increased thirst Hematologic/Lymphatic:  No anemia, purpura, petechiae. Allergic/Immunologic: no itchy/runny eyes, nasal congestion, recent allergic reactions, rashes  PHYSICAL EXAM: Vitals:   09/21/17 0848  BP: (!) 100/58  Pulse: 63  SpO2: 97%   General: No acute distress Head:  Normocephalic/atraumatic Eyes: Fundoscopic exam shows bilateral sharp discs, no vessel changes, exudates, or hemorrhages Neck: supple, no paraspinal tenderness, full range of motion Back: No paraspinal tenderness Heart: regular rate and rhythm Lungs: Clear to auscultation bilaterally. Vascular: No carotid bruits. Skin/Extremities: No rash, no edema. He has 4 fingers on the left hand, s/p index finger amputation in 1973 Neurological Exam: Mental status: alert and oriented to person, place, and time, no dysarthria or aphasia, Fund of knowledge is appropriate.  Recent and remote memory are intact.  Attention and concentration are normal. He had some difficulty doing serial 7s.  Able to name objects and repeat phrases.  Montreal Cognitive Assessment  09/21/2017  Visuospatial/ Executive (0/5) 5  Naming (0/3) 3  Attention: Read list of digits (0/2) 2  Attention: Read list of letters (0/1) 1  Attention: Serial 7 subtraction  starting at 100 (0/3) 2  Language: Repeat phrase (0/2) 2  Language : Fluency (0/1) 1  Abstraction (0/2) 2  Delayed Recall (0/5) 4  Orientation (0/6) 6  Total 28   Cranial nerves: CN I: not  tested CN II: pupils equal, round and reactive to light, visual fields intact, fundi unremarkable. CN III, IV, VI:  full range of motion, no nystagmus, no ptosis CN V: facial sensation intact CN VII: upper and lower face symmetric CN VIII: hearing intact to finger rub CN IX, X: gag intact, uvula midline CN XI: sternocleidomastoid and trapezius muscles intact CN XII: tongue midline Bulk & Tone: normal, no fasciculations. Motor: 5/5 throughout with no pronator drift. Sensation: intact to light touch, cold, pin, vibration and joint position sense.  No extinction to double simultaneous stimulation.  Romberg test negative Deep Tendon Reflexes: +2 throughout, no ankle clonus Plantar responses: downgoing bilaterally Cerebellar: no incoordination on finger to nose, heel to shin. No dysdiadochokinesia Gait: narrow-based and steady, able to tandem walk adequately. Tremor: none  IMPRESSION: This is a pleasant 72 year old right-handed man with a history of hypothyroidism, diet-controlled hyperlipidemia, presenting for worsening memory. It appears prior Neuropsychological testing in 2011 showed Mild Cognitive Impairment, records requested for review. He reports continued worsening of memory since then. Neurological exam normal, MOCA score today 28/30. We discussed different causes of memory changes, his last TSH was low in April, re-check TSH and check B12 level. MRI brain without contrast will be ordered to assess for underlying structural abnormality and assess vascular load. With his baseline of high intellectual skills, he may be more sensitive to memory changes over time. Repeat Neuropsychological testing will be helpful to assess for interval change. We also discussed the importance of control of vascular risk  factors, physical exercise, and brain stimulation exercises for brain health. We discussed medications such as Aricept, indications and expectations, and have agreed to hold off for now. He will follow-up after testing and knows to call for any changes.  Thank you for allowing me to participate in the care of this patient. Please do not hesitate to call for any questions or concerns.   Ellouise Newer, M.D.  CC: Dr. Charlett Blake

## 2017-09-21 NOTE — Patient Instructions (Signed)
1. Bloodwork for TSH, B12 2. Schedule MRI brain without contrast 3. Schedule Neuropsychological testing with Dr. Si Raider 4. Follow-up after testing  RECOMMENDATIONS FOR ALL PATIENTS WITH MEMORY PROBLEMS: 1. Continue to exercise (Recommend 30 minutes of walking everyday, or 3 hours every week) 2. Increase social interactions - continue going to [place] and enjoy social gatherings with friends and family 3. Eat healthy, avoid fried foods and eat more fruits and vegetables 4. Maintain adequate blood pressure, blood sugar, and blood cholesterol level. Reducing the risk of stroke and cardiovascular disease also helps promoting better memory. 5. Avoid stressful situations. Live a simple life and avoid aggravations. Organize your time and prepare for the next day in anticipation. 6. Sleep well, avoid any interruptions of sleep and avoid any distractions in the bedroom that may interfere with adequate sleep quality 7. Avoid sugar, avoid sweets as there is a strong link between excessive sugar intake, diabetes, and cognitive impairment We discussed the Mediterranean diet, which has been shown to help patients reduce the risk of progressive memory disorders and reduces cardiovascular risk. This includes eating fish, eat fruits and green leafy vegetables, nuts like almonds and hazelnuts, walnuts, and also use olive oil. Avoid fast foods and fried foods as much as possible. Avoid sweets and sugar as sugar use has been linked to worsening of memory function.

## 2017-09-22 ENCOUNTER — Encounter: Payer: Self-pay | Admitting: Family Medicine

## 2017-09-23 ENCOUNTER — Encounter: Payer: Self-pay | Admitting: Psychology

## 2017-09-25 ENCOUNTER — Encounter: Payer: Self-pay | Admitting: Family Medicine

## 2017-09-30 ENCOUNTER — Telehealth: Payer: Self-pay

## 2017-09-30 ENCOUNTER — Encounter: Payer: Self-pay | Admitting: Family Medicine

## 2017-09-30 NOTE — Telephone Encounter (Signed)
I am happy to refer but I do not know Dr Dessie Coma. I assume he is neurology? Once I know more I can refer.

## 2017-09-30 NOTE — Telephone Encounter (Signed)
Copied from [place] [id number]. Topic: Referral - Request >> Sep 30, 2017  3:40 PM Synthia Innocent wrote: Reason for CRM: Requesting referral to Neurologist for memory loss, Dr Dessie Coma, Fax # [phone]  Patient states he has had confusion and memory loss. States he has discussed with Dr. Charlett Blake in the past. Would like referral to Dr. Dessie Coma.

## 2017-10-02 ENCOUNTER — Encounter: Payer: Self-pay | Admitting: Pharmacist

## 2017-10-02 DIAGNOSIS — D6859 Other primary thrombophilia: Secondary | ICD-10-CM

## 2017-10-02 DIAGNOSIS — I2699 Other pulmonary embolism without acute cor pulmonale: Secondary | ICD-10-CM

## 2017-10-02 DIAGNOSIS — D689 Coagulation defect, unspecified: Secondary | ICD-10-CM

## 2017-10-02 DIAGNOSIS — I824Y9 Acute embolism and thrombosis of unspecified deep veins of unspecified proximal lower extremity: Secondary | ICD-10-CM

## 2017-10-02 DIAGNOSIS — Z7901 Long term (current) use of anticoagulants: Secondary | ICD-10-CM

## 2017-10-02 LAB — POCT INR: INR: 3.5 — AB (ref 2.0–3.0)

## 2017-10-02 LAB — PROTIME-INR

## 2017-10-02 NOTE — Progress Notes (Signed)
Reviewed He is still all over the map!

## 2017-10-02 NOTE — Progress Notes (Signed)
Anticoagulation Management Paul Ryan is a 72 y.o. male who contacted clinical pharmacist for warfarin monitoring. Patient has a home INR meter.  Indication: Chronic anticoagulation [Z79.01], Coagulopathy ([name]) [[name]], DVT ([place]) [182.409], Pulmonary Embolism ([place]) [I126.99], Hereditary coagulopathy.  Duration: indefinite Supervising physician: Murriel Hopper  Anticoagulation Clinic Visit History: Patient does not report signs/symptoms of bleeding or thromboembolism. Other recent changes: patient reports completing a course of metronidazole therapy 5 days ago.  Anticoagulation Episode Summary    Current INR goal:   2.0-3.0  TTR:   69.5 % (3.1 y)  Next INR check:   10/08/2017  INR from last check:   3.5! 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    cetirizine (ZYRTEC) 10 MG tablet TAKE 1 TABLET BY MOUTH EVERY DAY AS NEEDED FOR ALLERGIES  Cholecalciferol 4000 UNITS TABS Take 1 tablet by mouth daily.  Coenzyme Q10 (COQ10 PO) Take 2 tablets by mouth daily.  montelukast (SINGULAIR) 10 MG tablet Take 1 tablet (10 mg total) by mouth at bedtime.  NIACIN PO Take 1 tablet by mouth 3 (three) times daily.   NON FORMULARY [facility] immune support  Red Yeast Rice Extract (RED YEAST RICE PO) Take 1,200 mg by mouth 2 (two) times daily at 10 AM and 5 PM.  tacrolimus (PROTOPIC) 0.1 % ointment Apply topically 2 (two) times daily.  thyroid (ARMOUR THYROID) 60 MG tablet 60 mg tab 1 tab po M, W, Th, F, Sun  thyroid [facility] THYROID) 90 MG tablet 90 mg  Tab Tuesday and Saturday.  60 mg tab M, W, Th, F, Sun  TURMERIC PO Take 1,200 mg by mouth daily.  warfarin (COUMADIN) 4 MG tablet Take 1 tablet (4 mg total) by mouth daily.   Past Medical History:  Diagnosis Date  . Acid reflux 02/03/2016  . Allergy    seasonal  . Anxiety 12/01/2016  . Chronic anticoagulation 06/14/2013  . 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 Collagen vascular disease ([place])   . Cough 12/01/2016  . DVT (deep venous thrombosis) ([name])    takes coumadin  . Dysuria 02/27/2013  . Erectile dysfunction 12/28/2016  . 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 Hyperglycemia 12/26/2014  . Hyperlipidemia   . Hypothyroid 06/10/2011  . Insomnia 04/24/2016  . Left hip pain 12/17/2010  . MCI (mild cognitive impairment) 07/22/2016  . Nasal lesion 07/22/2016  . Polymyalgia ([place]) 03/21/2014  . 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 Preventative health care 12/17/2010  . Shingles 1982   right abdominal wall  . Staph skin infection 07/22/2016  . Thyroid nodule 11/29/2014  . Urinary hesitancy 09/30/2016  . Vitamin D deficiency 10/14/2011   Social History   Socioeconomic History  . Marital status: Married    Spouse name: Not on file  . Number of children: Not on file  . Years of education: Not on file  . Highest education level: Not on file  Occupational History  . Not on file  Social Needs  . Financial  resource strain: Not on file  . Food insecurity:    Worry: Not on file    Inability: Not on file  . Transportation needs:    Medical: Not on file    Non-medical: Not on file  Tobacco Use  . Smoking status: Former Smoker    Packs/day: 1.00    Years: 20.00    Pack years: 20.00    Types: Cigarettes    Last attempt to quit: 03/03/1980    Years since quitting: 37.6  . Smokeless tobacco: Never Used  Substance and Sexual Activity  . Alcohol use: No    Alcohol/week: 0.0 oz  . Drug use: No  . Sexual activity: Not on file  Lifestyle  . Physical activity:    Days per week: Not on file    Minutes  per session: Not on file  . Stress: Not on file  Relationships  . Social connections:    Talks on phone: Not on file    Gets together: Not on file    Attends religious service: Not on file    Active member of club or organization: Not on file    Attends meetings of clubs or organizations: Not on file    Relationship status: Not on file  Other Topics Concern  . Not on file  Social History Narrative  . Not on file   Family History  Problem Relation Age of Onset  . Cancer Mother        unknown- everywhere  . Cancer Sister        breast   ASSESSMENT Lab Results  Component Value Date   INR 3.5 (A) 10/02/2017   INR 3.0 09/17/2017   INR 2.6 09/08/2017   PROTIME 15.6 (H) 05/17/2013   Anticoagulation Dosing: Description   Dr. Beryle Beams patient     INR today: Supratherapeutic  PLAN Weekly dose of warfarin was unchanged since metronidazole course was completed 5 days ago. I did recommend patient take 4 mg today rather than 6 mg, but continue on regimen of 6 mg 3 days per week with 4 mg all other days.  There are no Patient Instructions on file for this visit. Patient advised to contact clinic or seek medical attention if signs/symptoms of bleeding or thromboembolism occur.  Patient verbalized understanding by repeating back information and was advised to contact me if further medication-related questions arise. Patient was also provided an information handout.  Follow-up Return in about 1 week (around 10/09/2017).  [name]

## 2017-10-08 ENCOUNTER — Other Ambulatory Visit: Payer: Self-pay | Admitting: Family Medicine

## 2017-10-08 DIAGNOSIS — G3184 Mild cognitive impairment, so stated: Secondary | ICD-10-CM

## 2017-10-14 ENCOUNTER — Other Ambulatory Visit: Payer: Self-pay | Admitting: Oncology

## 2017-10-14 DIAGNOSIS — I82509 Chronic embolism and thrombosis of unspecified deep veins of unspecified lower extremity: Secondary | ICD-10-CM

## 2017-10-16 ENCOUNTER — Other Ambulatory Visit: Payer: Medicare Other

## 2017-10-21 LAB — POCT INR: INR: 3.5 — AB (ref 2.0–3.0)

## 2017-10-27 ENCOUNTER — Encounter: Payer: Self-pay | Admitting: Pharmacist

## 2017-10-27 DIAGNOSIS — I2699 Other pulmonary embolism without acute cor pulmonale: Secondary | ICD-10-CM

## 2017-10-27 DIAGNOSIS — I824Y9 Acute embolism and thrombosis of unspecified deep veins of unspecified proximal lower extremity: Secondary | ICD-10-CM

## 2017-10-27 DIAGNOSIS — Z7901 Long term (current) use of anticoagulants: Secondary | ICD-10-CM

## 2017-10-27 DIAGNOSIS — D689 Coagulation defect, unspecified: Secondary | ICD-10-CM

## 2017-10-27 DIAGNOSIS — D6859 Other primary thrombophilia: Secondary | ICD-10-CM

## 2017-10-27 LAB — PROTIME-INR

## 2017-10-27 NOTE — Progress Notes (Signed)
Anticoagulation Management Paul Ryan a 72 y.o.malewho contacted clinical pharmacist for warfarin monitoring. Patient has a home INR meter.  Indication:Chronic anticoagulation [Z79.01], Coagulopathy ([place]) [[name]], DVT ([place]) [182.409], Pulmonary Embolism ([place]) [I126.99], Hereditary coagulopathy.  Duration:indefinite Supervising physician:James [place]  Anticoagulation Clinic Visit History: [name] notreport signs/symptoms of bleeding or thromboembolism.  Anticoagulation Episode Summary    Current INR goal:   2.0-3.0  TTR:   68.4 % (3.2 y)  Next INR check:   11/03/2017  INR from last check:   3.5! 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    cetirizine (ZYRTEC) 10 MG tablet TAKE 1 TABLET BY MOUTH EVERY DAY AS NEEDED FOR ALLERGIES  Cholecalciferol 4000 UNITS TABS Take 1 tablet by mouth daily.  Coenzyme Q10 (COQ10 PO) Take 2 tablets by mouth daily.  montelukast (SINGULAIR) 10 MG tablet Take 1 tablet (10 mg total) by mouth at bedtime.  NIACIN PO Take 1 tablet by mouth 3 (three) times daily.   NON FORMULARY AHCC immune support  Red Yeast Rice Extract (RED YEAST RICE PO) Take 1,200 mg by mouth 2 (two) times daily at 10 AM and 5 PM.  tacrolimus (PROTOPIC) 0.1 % ointment Apply topically 2 (two) times daily.  thyroid (ARMOUR THYROID) 60 MG tablet 60 mg tab 1 tab po M, W, Th, F, Sun  thyroid [facility] THYROID) 90 MG tablet 90 mg  Tab Tuesday and Saturday. 60 mg tab M, W, Th, F, Sun  TURMERIC PO Take 1,200 mg by mouth daily.  warfarin (COUMADIN) 4  MG tablet TAKE 1 TABLET BY MOUTH EVERY DAY   Past Medical History:  Diagnosis Date  . Acid reflux 02/03/2016  . Allergy    seasonal  . Anxiety 12/01/2016  . Chronic anticoagulation 06/14/2013  . Coagulopathy ([place]) 05/16/2011  . Collagen vascular disease ([place])   . Cough 12/01/2016  . DVT (deep venous thrombosis) ([name])    takes coumadin  . Dysuria 02/27/2013  . Erectile dysfunction 12/28/2016  . 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 Hyperglycemia 12/26/2014  . Hyperlipidemia   . Hypothyroid 06/10/2011  . Insomnia 04/24/2016  . Left hip pain 12/17/2010  . MCI (mild cognitive impairment) 07/22/2016  . Nasal lesion 07/22/2016  . Polymyalgia ([place]) 03/21/2014  . 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 Preventative health care 12/17/2010  . Shingles 1982   right abdominal wall  . Staph skin infection 07/22/2016  . Thyroid nodule 11/29/2014  . Urinary hesitancy 09/30/2016  . Vitamin D deficiency 10/14/2011   Social History   Socioeconomic History  . Marital status: Married    Spouse name: Not on file  . Number of children: Not on file  . Years of education: Not on file  . Highest education level: Not on file  Occupational History  . Not on file  Social Needs  . Financial resource strain: Not on file  . Food insecurity:    Worry: Not on file    Inability: Not on file  .  Transportation needs:    Medical: Not on file    Non-medical: Not on file  Tobacco Use  . Smoking status: Former Smoker    Packs/day: 1.00    Years: 20.00    Pack years: 20.00    Types: Cigarettes    Last attempt to quit: 03/03/1980    Years since quitting: 37.6  . Smokeless tobacco: Never Used  Substance and Sexual Activity  . Alcohol use: No    Alcohol/week: 0.0 standard drinks  . Drug use: No  . Sexual activity: Not on file  Lifestyle  . Physical activity:    Days per week: Not on file    Minutes per session: Not on file  . Stress: Not on file  Relationships  . Social connections:     Talks on phone: Not on file    Gets together: Not on file    Attends religious service: Not on file    Active member of club or organization: Not on file    Attends meetings of clubs or organizations: Not on file    Relationship status: Not on file  Other Topics Concern  . Not on file  Social History Narrative  . Not on file   Family History  Problem Relation Age of Onset  . Cancer Mother        unknown- everywhere  . Cancer Sister        breast   ASSESSMENT Recent Results: Lab Results  Component Value Date   INR 3.5 (A) 10/21/2017   INR 3.5 (A) 10/02/2017   INR 3.0 09/17/2017   PROTIME 15.6 (H) 05/17/2013   Anticoagulation Dosing: Description   Dr. Beryle Beams patient     INR today: Supratherapeutic  PLAN Weekly dose was decreased by 17% to 28 mg per week  There are no Patient Instructions on file for this visit. Patient advised to contact clinic or seek medical attention if signs/symptoms of bleeding or thromboembolism occur.  Patient verbalized understanding by repeating back information and was advised to contact me if further medication-related questions arise. Patient was also provided an information handout.  Follow-up 1 week  [name]

## 2017-10-29 ENCOUNTER — Encounter: Payer: Self-pay | Admitting: Pharmacist

## 2017-10-29 DIAGNOSIS — D6859 Other primary thrombophilia: Secondary | ICD-10-CM

## 2017-10-29 DIAGNOSIS — I824Y9 Acute embolism and thrombosis of unspecified deep veins of unspecified proximal lower extremity: Secondary | ICD-10-CM

## 2017-10-29 DIAGNOSIS — Z7901 Long term (current) use of anticoagulants: Secondary | ICD-10-CM

## 2017-10-29 DIAGNOSIS — D689 Coagulation defect, unspecified: Secondary | ICD-10-CM

## 2017-10-29 DIAGNOSIS — I2699 Other pulmonary embolism without acute cor pulmonale: Secondary | ICD-10-CM

## 2017-10-29 LAB — POCT INR: INR: 2.7 (ref 2.0–3.0)

## 2017-10-29 NOTE — Progress Notes (Signed)
Anticoagulation Management Paul Ryan a 72 y.o.malewhocontacted clinical pharmacist for warfarin monitoring. Patient has a home INR meter.  Indication:Chronic anticoagulation [Z79.01], Coagulopathy ([place]) [[name]], DVT ([place]) [182.409], Pulmonary Embolism ([place]) [I126.99], Hereditary coagulopathy.  Duration:indefinite Supervising physician:James [place]  Anticoagulation Clinic Visit History: [name] notreport signs/symptoms of bleeding or thromboembolism.  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    cetirizine (ZYRTEC) 10 MG tablet TAKE 1 TABLET BY MOUTH EVERY DAY AS NEEDED FOR ALLERGIES  Cholecalciferol 4000 UNITS TABS Take 1 tablet by mouth daily.  Coenzyme Q10 (COQ10 PO) Take 2 tablets by mouth daily.  montelukast (SINGULAIR) 10 MG tablet Take 1 tablet (10 mg total) by mouth at bedtime.  NIACIN PO Take 1 tablet by mouth 3 (three) times daily.   NON FORMULARY AHCC immune support  Red Yeast Rice Extract (RED YEAST RICE PO) Take 1,200 mg by mouth 2 (two) times daily at 10 AM and 5 PM.  tacrolimus (PROTOPIC) 0.1 % ointment Apply topically 2 (two) times daily.  thyroid (ARMOUR THYROID) 60 MG tablet 60 mg tab 1 tab po M, W, Th, F, Sun  thyroid [facility] THYROID) 90 MG tablet 90 mg  Tab Tuesday and Saturday. 60 mg tab M, W, Th, F, Sun  TURMERIC PO Take 1,200 mg by mouth daily.  warfarin (COUMADIN) 4  MG tablet TAKE 1 TABLET BY MOUTH EVERY DAY   Past Medical History:  Diagnosis Date  . Acid reflux 02/03/2016  . Allergy    seasonal  . Anxiety 12/01/2016  . Chronic anticoagulation 06/14/2013  . Coagulopathy ([place]) 05/16/2011  . Collagen vascular disease ([place])   . Cough 12/01/2016  . DVT (deep venous thrombosis) ([name])    takes coumadin  . Dysuria 02/27/2013  . Erectile dysfunction 12/28/2016  . 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 Hyperglycemia 12/26/2014  . Hyperlipidemia   . Hypothyroid 06/10/2011  . Insomnia 04/24/2016  . Left hip pain 12/17/2010  . MCI (mild cognitive impairment) 07/22/2016  . Nasal lesion 07/22/2016  . Polymyalgia ([place]) 03/21/2014  . 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 Preventative health care 12/17/2010  . Shingles 1982   right abdominal wall  . Staph skin infection 07/22/2016  . Thyroid nodule 11/29/2014  . Urinary hesitancy 09/30/2016  . Vitamin D deficiency 10/14/2011   Social History   Socioeconomic History  . Marital status: Married    Spouse name: Not on file  . Number of children: Not on file  . Years of education: Not on file  . Highest education level: Not on file  Occupational History  . Not on file  Social Needs  . Financial resource strain: Not on file  . Food insecurity:    Worry: Not on file    Inability: Not on file  .  Transportation needs:    Medical: Not on file    Non-medical: Not on file  Tobacco Use  . Smoking status: Former Smoker    Packs/day: 1.00    Years: 20.00    Pack years: 20.00    Types: Cigarettes    Last attempt to quit: 03/03/1980    Years since quitting: 37.6  . Smokeless tobacco: Never Used  Substance and Sexual Activity  . Alcohol use: No    Alcohol/week: 0.0 standard drinks  . Drug use: No  . Sexual activity: Not on file  Lifestyle  . Physical activity:    Days per week: Not on file    Minutes per session: Not on file  . Stress: Not on file  Relationships  . Social connections:     Talks on phone: Not on file    Gets together: Not on file    Attends religious service: Not on file    Active member of club or organization: Not on file    Attends meetings of clubs or organizations: Not on file    Relationship status: Not on file  Other Topics Concern  . Not on file  Social History Narrative  . Not on file   Family History  Problem Relation Age of Onset  . Cancer Mother        unknown- everywhere  . Cancer Sister        breast   ASSESSMENT Recent Results: Lab Results  Component Value Date   INR 2.7 10/29/2017   INR 3.5 (A) 10/21/2017   INR 3.5 (A) 10/02/2017   PROTIME 15.6 (H) 05/17/2013   Anticoagulation Dosing: Description   Dr. Beryle Beams patient     INR today: Therapeutic  PLAN Weekly dose was unchanged   There are no Patient Instructions on file for this visit. Patient advised to contact clinic or seek medical attention if signs/symptoms of bleeding or thromboembolism occur.  Patient verbalized understanding by repeating back information and was advised to contact me if further medication-related questions arise. Patient was also provided an information handout.  Follow-up Return in about 2 weeks (around 11/12/2017).  [name]

## 2017-10-30 NOTE — Progress Notes (Signed)
Reviewed Thanks DrG 

## 2017-11-03 ENCOUNTER — Encounter: Payer: Self-pay | Admitting: Family Medicine

## 2017-11-03 NOTE — Telephone Encounter (Signed)
Schedule patient for 11/05/17 @ 9am

## 2017-11-03 NOTE — Telephone Encounter (Signed)
Copied from [place] [phone]. Topic: Appointment Scheduling - Scheduling Inquiry for Clinic >> Nov 03, 2017  9:31 AM Oliver Pila B wrote: Reason for CRM: pt called to get an appt w/ pcp b/c he is needing a f/u visit w/ pcp about an infection; the abx is not working and pt wants to see pcp only; contact to advise    -[place], Can you find place for patient sometime this week  Please advise

## 2017-11-04 LAB — PROTIME-INR

## 2017-11-05 ENCOUNTER — Other Ambulatory Visit (HOSPITAL_COMMUNITY)
Admission: RE | Admit: 2017-11-05 | Discharge: 2017-11-05 | Disposition: A | Discharge status: Home / Self Care | Payer: Medicare Other | Source: Ambulatory Visit | Attending: Family Medicine | Admitting: Family Medicine

## 2017-11-05 ENCOUNTER — Ambulatory Visit: Payer: Medicare Other | Admitting: Family Medicine

## 2017-11-05 VITALS — BP 102/62 | HR 55 | Temp 97.7°F | Resp 18 | Wt 162.4 lb

## 2017-11-05 DIAGNOSIS — N342 Other urethritis: Secondary | ICD-10-CM | POA: Insufficient documentation

## 2017-11-05 DIAGNOSIS — R739 Hyperglycemia, unspecified: Secondary | ICD-10-CM

## 2017-11-05 DIAGNOSIS — E785 Hyperlipidemia, unspecified: Secondary | ICD-10-CM

## 2017-11-05 DIAGNOSIS — R109 Unspecified abdominal pain: Secondary | ICD-10-CM | POA: Insufficient documentation

## 2017-11-05 DIAGNOSIS — E559 Vitamin D deficiency, unspecified: Secondary | ICD-10-CM

## 2017-11-05 DIAGNOSIS — R350 Frequency of micturition: Secondary | ICD-10-CM

## 2017-11-05 DIAGNOSIS — E039 Hypothyroidism, unspecified: Secondary | ICD-10-CM

## 2017-11-05 DIAGNOSIS — B372 Candidiasis of skin and nail: Secondary | ICD-10-CM | POA: Insufficient documentation

## 2017-11-05 LAB — COMPREHENSIVE METABOLIC PANEL
ALBUMIN: 4.3 g/dL (ref 3.5–5.2)
ALT: 20 U/L (ref 0–53)
AST: 19 U/L (ref 0–37)
Alkaline Phosphatase: 53 U/L (ref 39–117)
BILIRUBIN TOTAL: 0.7 mg/dL (ref 0.2–1.2)
BUN: 16 mg/dL (ref 6–23)
CALCIUM: 9.4 mg/dL (ref 8.4–10.5)
CO2: 29 meq/L (ref 19–32)
CREATININE: 0.94 mg/dL (ref 0.40–1.50)
Chloride: 104 mEq/L (ref 96–112)
GFR: 83.56 mL/min (ref >60.00)
Glucose, Bld: 98 mg/dL (ref 70–99)
Potassium: 5.1 mEq/L (ref 3.5–5.1)
SODIUM: 139 meq/L (ref 135–145)
Total Protein: 7.2 g/dL (ref 6.0–8.3)

## 2017-11-05 LAB — CBC
HCT: 46.5 % (ref 39.0–52.0)
Hemoglobin: 15.5 g/dL (ref 13.0–17.0)
MCHC: 33.4 g/dL (ref 30.0–36.0)
MCV: 90 fl (ref 78.0–100.0)
PLATELETS: 209 10*3/uL (ref 150.0–400.0)
RBC: 5.17 Mil/uL (ref 4.22–5.81)
RDW: 14.4 % (ref 11.5–15.5)
WBC: 6.2 10*3/uL (ref 4.0–10.5)

## 2017-11-05 LAB — URINALYSIS
BILIRUBIN URINE: NEGATIVE
Hgb urine dipstick: NEGATIVE
KETONES UR: NEGATIVE
Leukocytes, UA: NEGATIVE
Nitrite: NEGATIVE
PH: 7 (ref 5.0–8.0)
TOTAL PROTEIN, URINE-UPE24: NEGATIVE
Urine Glucose: NEGATIVE
Urobilinogen, UA: 0.2 (ref 0.0–1.0)

## 2017-11-05 LAB — LIPID PANEL
Cholesterol: 259 mg/dL — ABNORMAL HIGH (ref 0–200)
HDL: 39.2 mg/dL (ref >39.00)
LDL Cholesterol: 192 mg/dL — ABNORMAL HIGH (ref 0–99)
NonHDL: 219.53
TRIGLYCERIDES: 138 mg/dL (ref 0.0–149.0)
Total CHOL/HDL Ratio: 7
VLDL: 27.6 mg/dL (ref 0.0–40.0)

## 2017-11-05 LAB — HEMOGLOBIN A1C: Hgb A1c MFr Bld: 6.1 % (ref 4.6–6.5)

## 2017-11-05 LAB — TSH: TSH: 0.25 u[IU]/mL — AB (ref 0.35–4.50)

## 2017-11-05 LAB — T4, FREE: FREE T4: 0.65 ng/dL (ref 0.60–1.60)

## 2017-11-05 MED ORDER — FLUCONAZOLE 150 MG PO TABS: ORAL_TABLET | ORAL | 1 refills | Status: DC

## 2017-11-05 NOTE — Assessment & Plan Note (Signed)
Check labs today.

## 2017-11-05 NOTE — Assessment & Plan Note (Signed)
Vitamin D

## 2017-11-05 NOTE — Assessment & Plan Note (Signed)
Check ancillary urine today

## 2017-11-05 NOTE — Assessment & Plan Note (Signed)
RLQ is intermittent and he does not feel it changes with position changes. He will hdyrate well but if it persists will need to get him to a CT scan to rule out kidney stones

## 2017-11-05 NOTE — Patient Instructions (Signed)
Encouraged increased hydration and fiber in diet. Daily probiotics. If bowels not moving can use MOM 2 tbls po in 4 oz of warm prune juice by mouth every 2-3 days. If no results then repeat in 4 hours with  Dulcolax suppository pr, may repeat again in 4 more hours as needed. Seek care if symptoms worsen. Consider daily Miralax and/or Dulcolax if symptoms persist.   Genital Yeast Infection, Male In men, a genital yeast infection is a condition that causes soreness, swelling, and redness (inflammation) of the head of the penis (glans penis). This type of infection is also called balanitis. A genital yeast infection can be spread through sexual contact, but it can also develop without sexual contact. If the infection is not treated properly, it is likely to come back. What are the causes? This condition is caused by a change in the normal balance of the yeast and bacteria that live on the skin. This change causes an overgrowth of yeast, which causes the inflammation. Many types of yeast can cause this infection, but candida is the most common. What increases the risk? This condition is more likely to develop in a man if:  He takes antibiotic medicines.  He has diabetes.  He is exposed to the infection by a sexual partner.  He is not circumcised.  He has a weak defense (immune) system.  He has been taking steroid medicines for a long time.  He has poor hygiene.  What are the signs or symptoms? Symptoms of this condition include:  Itching.  Dry, red, or cracked skin on the penis.  Swelling.  Pain while urinating or difficulty urinating.  Thick, bad-smelling discharge on the penis.  How is this diagnosed? This condition is diagnosed with a medical history and physical exam. Your health care provider may examine a sample of any discharge from the penis and send the sample for testing. You may also have tests, including:  A urine test to rule out a urinary tract infection (UTI).  A  blood test to rule out diabetes.  How is this treated? This condition is treated with antifungal cream or pills along with self-care at home. Antifungal medicines may be prescribed to you or they may be available over-the-counter. For men who are not circumcised, circumcision may be recommended to control infections that return and are difficult to treat. Follow these instructions at home:  Take or apply over-the-counter and prescription medicines only as told by your health care provider.  Do not have sex until your health care provider has approved. Tell your sexual partner that you have a yeast infection. That person should go to his or her health care provider if he or she develops symptoms.  Eat more yogurt. This may help to keep your yeast infection from returning.  Wash your penis with soap and water every day. If you are not circumcised, pull back the foreskin to wash. Make sure to dry your penis completely after washing.  Wear breathable, cotton underwear.  Keep your underwear clean and dry.  If you have diabetes, keep your blood sugar levels under strict control. Contact a health care provider if:  You have a fever.  Your symptoms go away and then return.  Your symptoms do not get better with treatment.  Your symptoms get worse.  You have new symptoms. Get help right away if:  Your swelling and inflammation become so severe that you cannot urinate. This information is not intended to replace advice given to you by your  health care provider. Make sure you discuss any questions you have with your health care provider. Document Released: 03/27/2004 Document Revised: 07/26/2015 Document Reviewed: 08/21/2014 Elsevier Interactive Patient Education  [employer].

## 2017-11-05 NOTE — Assessment & Plan Note (Signed)
On the penile shaft in an uncircumcised male. He reports the nystatin was helpful and he is actually healed today. He brings in a photo which shows skin erythemaotus, fluctuant and macerated. Started on Diflucan 150 mg po q week prn.

## 2017-11-05 NOTE — Assessment & Plan Note (Signed)
hgba1c acceptable, minimize simple carbs. Increase exercise as tolerated. Continue current meds 

## 2017-11-05 NOTE — Progress Notes (Signed)
Subjective:  I acted as a Education administrator for Dr. Charlett Blake. [place], [place]  Patient ID: Paul Ryan, male    DOB: 04/01/1945, 72 y.o.   MRN: [id number]  No chief complaint on file.   HPI  Patient is in today for follow up and he is heading to [place] for a month next month. No recent febrile illness but he is noting infrequently flares of pain in lower her back. No falls or injury. He is here mostly for dysuria and rash. On the penile shaft in an uncircumcised male. He reports the nystatin was helpful and he is actually healed today. Denies CP/palp/SOB/HA/congestion/fevers/GI c/o. Taking meds as prescribed  Patient Care Team: [name], MD as PCP - General (Family Medicine) Roena Malady, MD (Ophthalmology) Forde Dandy, PharmD as Pharmacist (Pharmacist)   Past Medical History:  Diagnosis Date  . Acid reflux 02/03/2016  . Allergy    seasonal  . Anxiety 12/01/2016  . Chronic anticoagulation 06/14/2013  . Coagulopathy ([place]) 05/16/2011  . Collagen vascular disease ([place])   . Cough 12/01/2016  . DVT (deep venous thrombosis) ([name])    takes coumadin  . Dysuria 02/27/2013  . Erectile dysfunction 12/28/2016  . 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 Hyperglycemia 12/26/2014  . Hyperlipidemia   . Hypothyroid 06/10/2011  . Insomnia 04/24/2016  . Left hip pain 12/17/2010  . MCI (mild cognitive impairment) 07/22/2016  . Nasal lesion 07/22/2016  . Polymyalgia ([place]) 03/21/2014  . 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 Preventative health care 12/17/2010  . Shingles 1982   right abdominal wall  . Staph skin infection 07/22/2016  . Thyroid nodule 11/29/2014  . Urinary hesitancy 09/30/2016  . Vitamin D deficiency 10/14/2011    Past Surgical History:  Procedure Laterality Date  . 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 Cancer Mother        unknown- everywhere  . Cancer Sister        breast    Social  History   Socioeconomic History  . Marital status: Married    Spouse name: Not on file  . Number of children: Not on file  . Years of education: Not on file  . Highest education level: Not on file  Occupational History  . Not on file  Social Needs  . Financial resource strain: Not on file  . Food insecurity:    Worry: Not on file    Inability: Not on file  . Transportation needs:    Medical: Not on file    Non-medical: Not on file  Tobacco Use  . Smoking status: Former Smoker    Packs/day: 1.00    Years: 20.00    Pack years: 20.00    Types: Cigarettes    Last attempt to quit: 03/03/1980    Years since quitting: 37.7  . Smokeless tobacco: Never Used  Substance and Sexual Activity  . Alcohol use: No    Alcohol/week: 0.0 standard drinks  . Drug use: No  . Sexual activity: Not on file  Lifestyle  . Physical activity:    Days per week: Not on file    Minutes per session: Not on file  . Stress: Not on file  Relationships  . Social connections:    Talks on phone: Not on file    Gets together: Not on file    Attends religious service: Not on file  Active member of club or organization: Not on file    Attends meetings of clubs or organizations: Not on file    Relationship status: Not on file  . Intimate partner violence:    Fear of current or ex partner: Not on file    Emotionally abused: Not on file    Physically abused: Not on file    Forced sexual activity: Not on file  Other Topics Concern  . Not on file  Social History Narrative  . Not on file    Outpatient Medications Prior to Visit  Medication Sig Dispense Refill  . b complex vitamins tablet Take 1 tablet by mouth daily.      . cetirizine (ZYRTEC) 10 MG tablet TAKE 1 TABLET BY MOUTH EVERY DAY AS NEEDED FOR ALLERGIES 30 tablet 4  . Cholecalciferol 4000 UNITS TABS Take 1 tablet by mouth daily.    . Coenzyme Q10 (COQ10 PO) Take 2 tablets by mouth daily.    . montelukast (SINGULAIR) 10 MG tablet Take 1 tablet  (10 mg total) by mouth at bedtime. 90 tablet 3  . NIACIN PO Take 1 tablet by mouth 3 (three) times daily.     . NON FORMULARY [facility] immune support    . Red Yeast Rice Extract (RED YEAST RICE PO) Take 1,200 mg by mouth 2 (two) times daily at 10 AM and 5 PM.    . tacrolimus (PROTOPIC) 0.1 % ointment Apply topically 2 (two) times daily. 100 g 1  . thyroid (ARMOUR THYROID) 60 MG tablet 60 mg tab 1 tab po M, W, Th, F, Sun 75 tablet 1  . thyroid (ARMOUR THYROID) 90 MG tablet 90 mg  Tab Tuesday and Saturday. 60 mg tab M, W, Th, F, Sun 30 tablet 1  . TURMERIC PO Take 1,200 mg by mouth daily.    [name] Kitchen warfarin (COUMADIN) 4 MG tablet TAKE 1 TABLET BY MOUTH EVERY DAY 90 tablet 3   Facility-Administered Medications Prior to Visit  Medication Dose Route Frequency Provider Last Rate Last Dose  . triamcinolone acetonide (KENALOG) 10 MG/ML injection 10 mg  10 mg Other Once Trula Slade, DPM        No Known Allergies  Review of Systems  Constitutional: Negative for fever and malaise/fatigue.  HENT: Negative for congestion.   Eyes: Negative for blurred vision.  Respiratory: Negative for shortness of breath.   Cardiovascular: Negative for chest pain, palpitations and leg swelling.  Gastrointestinal: Negative for abdominal pain, blood in stool and nausea.  Genitourinary: Positive for dysuria and flank pain. Negative for frequency, hematuria and urgency.  Musculoskeletal: Negative for falls.  Skin: Negative for rash.  Neurological: Negative for dizziness, loss of consciousness and headaches.  Endo/Heme/Allergies: Negative for environmental allergies.  Psychiatric/Behavioral: Negative for depression. The patient is not nervous/anxious.        Objective:    Physical Exam  BP 102/62 (BP Location: Left Arm, Patient Position: Sitting, Cuff Size: Normal)   Pulse (!) 55   Temp 97.7 F (36.5 C) (Oral)   Resp 18   Wt 162 lb 6.4 oz (73.7 kg)   SpO2 98%   BMI 22.65 kg/m  Wt Readings from Last 3  Encounters:  11/05/17 162 lb 6.4 oz (73.7 kg)  09/21/17 161 lb (73 kg)  06/16/17 162 lb 6.4 oz (73.7 kg)   BP Readings from Last 3 Encounters:  11/05/17 102/62  09/21/17 (!) 100/58  06/16/17 (!) 98/58     Immunization History  Administered Date(s)  Administered  . Hepatitis A 11/09/1998  . IPV 11/09/1998  . Influenza Split 11/22/2015  . Influenza Whole 12/13/2012  . Influenza, High Dose Seasonal PF 10/31/2016  . Influenza,inj,Quad PF,6+ Mos 10/25/2013, 12/26/2014  . Influenza-Unspecified 12/02/2011  . PPD Test 03/23/2015  . Pneumococcal Conjugate-13 10/25/2013  . Pneumococcal Polysaccharide-23 06/19/2009, 12/26/2014  . Td 03/03/1990, 07/01/2001  . Tdap 12/17/2010  . Typhoid Inactivated 11/09/1998, 03/09/2001  . Zoster 08/01/2008    Health Maintenance  Topic Date Due  . FOOT EXAM  08/24/1954  . OPHTHALMOLOGY EXAM  08/24/1954  . URINE MICROALBUMIN  08/24/1954  . INFLUENZA VACCINE  10/01/2017  . HEMOGLOBIN A1C  12/16/2017  . TETANUS/TDAP  12/16/2020  . COLONOSCOPY  06/20/2026  . Hepatitis C Screening  Completed  . PNA vac Low Risk Adult  Completed    Lab Results  Component Value Date   WBC 6.0 06/16/2017   HGB 15.7 06/16/2017   HCT 47.7 06/16/2017   PLT 206.0 06/16/2017   GLUCOSE 95 06/16/2017   CHOL 212 (H) 06/16/2017   TRIG 103.0 06/16/2017   HDL 43.30 06/16/2017   LDLDIRECT 158.0 03/11/2013   LDLCALC 148 (H) 06/16/2017   ALT 31 06/16/2017   AST 28 06/16/2017   NA 140 06/16/2017   K 4.8 06/16/2017   CL 106 06/16/2017   CREATININE 1.05 06/16/2017   BUN 16 06/16/2017   CO2 28 06/16/2017   TSH 0.23 (L) 09/21/2017   PSA 1.53 07/18/2016   INR 2.7 10/29/2017   HGBA1C 6.1 06/16/2017    Lab Results  Component Value Date   TSH 0.23 (L) 09/21/2017   Lab Results  Component Value Date   WBC 6.0 06/16/2017   HGB 15.7 06/16/2017   HCT 47.7 06/16/2017   MCV 91.5 06/16/2017   PLT 206.0 06/16/2017   Lab Results  Component Value Date   NA 140 06/16/2017    K 4.8 06/16/2017   CHLORIDE 109 11/16/2012   CO2 28 06/16/2017   GLUCOSE 95 06/16/2017   BUN 16 06/16/2017   CREATININE 1.05 06/16/2017   BILITOT 0.6 06/16/2017   ALKPHOS 44 06/16/2017   AST 28 06/16/2017   ALT 31 06/16/2017   PROT 7.3 06/16/2017   ALBUMIN 4.2 06/16/2017   CALCIUM 9.5 06/16/2017   ANIONGAP 8 09/11/2014   GFR 73.63 06/16/2017   Lab Results  Component Value Date   CHOL 212 (H) 06/16/2017   Lab Results  Component Value Date   HDL 43.30 06/16/2017   Lab Results  Component Value Date   LDLCALC 148 (H) 06/16/2017   Lab Results  Component Value Date   TRIG 103.0 06/16/2017   Lab Results  Component Value Date   CHOLHDL 5 06/16/2017   Lab Results  Component Value Date   HGBA1C 6.1 06/16/2017         Assessment & Plan:   Problem List Items Addressed This Visit    Hyperlipidemia    Tolerating statin, encouraged heart healthy diet, avoid trans fats, minimize simple carbs and saturated fats. Increase exercise as tolerated      Relevant Orders   Lipid panel   Hypothyroid    Check labs today      Relevant Orders   TSH   T4, free   Vitamin D deficiency    Vitamin D      Hyperglycemia    hgba1c acceptable, minimize simple carbs. Increase exercise as tolerated. Continue current meds      Relevant Orders   Hemoglobin A1c  Comprehensive metabolic panel   Urinary frequency    Check UA and culture       Relevant Orders   Urinalysis   Urine Culture   Urinalysis   Urine Culture   Urethritis - Primary    Check ancillary urine today      Relevant Orders   Urine cytology ancillary only   CBC   Urinalysis   Urine Culture   Yeast infection of the skin    On the penile shaft in an uncircumcised male. He reports the nystatin was helpful and he is actually healed today. He brings in a photo which shows skin erythemaotus, fluctuant and macerated. Started on Diflucan 150 mg po q week prn.       Relevant Medications   fluconazole (DIFLUCAN)  150 MG tablet   Flank pain    RLQ is intermittent and he does not feel it changes with position changes. He will hdyrate well but if it persists will need to get him to a CT scan to rule out kidney stones         I am having Unknown Foley start on fluconazole. I am also having him maintain his b complex vitamins, tacrolimus, NIACIN PO, Cholecalciferol, Coenzyme Q10 (COQ10 PO), cetirizine, montelukast, thyroid, thyroid, TURMERIC PO, Red Yeast Rice Extract (RED YEAST RICE PO), NON FORMULARY, and warfarin. We will continue to administer triamcinolone acetonide.  Meds ordered this encounter  Medications  . fluconazole (DIFLUCAN) 150 MG tablet    Sig: 1 tab po weekly prn rash    Dispense:  2 tablet    Refill:  1    CMA served as scribe during this visit. History, Physical and Plan performed by medical provider. Documentation and orders reviewed and attested to.  Penni Homans, MD

## 2017-11-05 NOTE — Assessment & Plan Note (Signed)
Tolerating statin, encouraged heart healthy diet, avoid trans fats, minimize simple carbs and saturated fats. Increase exercise as tolerated 

## 2017-11-05 NOTE — Assessment & Plan Note (Signed)
Check UA and culture 

## 2017-11-06 ENCOUNTER — Encounter: Payer: Self-pay | Admitting: Family Medicine

## 2017-11-06 LAB — URINE CYTOLOGY ANCILLARY ONLY
Chlamydia: NEGATIVE
Neisseria Gonorrhea: NEGATIVE
TRICH (WINDOWPATH): NEGATIVE

## 2017-11-06 LAB — URINE CULTURE
MICRO NUMBER: 91062044
SPECIMEN QUALITY: ADEQUATE

## 2017-11-07 ENCOUNTER — Encounter: Payer: Self-pay | Admitting: Family Medicine

## 2017-11-08 LAB — URINE CYTOLOGY ANCILLARY ONLY
Bacterial vaginitis: NEGATIVE
Candida vaginitis: NEGATIVE

## 2017-11-14 ENCOUNTER — Encounter: Payer: Self-pay | Admitting: Family Medicine

## 2017-11-16 ENCOUNTER — Encounter: Payer: Self-pay | Admitting: Pharmacist

## 2017-11-16 DIAGNOSIS — Z7901 Long term (current) use of anticoagulants: Secondary | ICD-10-CM

## 2017-11-16 DIAGNOSIS — D6859 Other primary thrombophilia: Secondary | ICD-10-CM

## 2017-11-16 DIAGNOSIS — D689 Coagulation defect, unspecified: Secondary | ICD-10-CM

## 2017-11-16 DIAGNOSIS — I2782 Chronic pulmonary embolism: Secondary | ICD-10-CM

## 2017-11-16 DIAGNOSIS — I825Y1 Chronic embolism and thrombosis of unspecified deep veins of right proximal lower extremity: Secondary | ICD-10-CM

## 2017-11-16 LAB — POCT INR: INR: 3.4 — AB (ref 2.0–3.0)

## 2017-11-17 ENCOUNTER — Encounter: Payer: Self-pay | Admitting: Oncology

## 2017-11-17 ENCOUNTER — Ambulatory Visit: Payer: Medicare Other | Admitting: Oncology

## 2017-11-17 ENCOUNTER — Other Ambulatory Visit: Payer: Self-pay

## 2017-11-17 VITALS — BP 105/68 | HR 54 | Temp 97.8°F | Ht 70.0 in | Wt 164.8 lb

## 2017-11-17 DIAGNOSIS — E039 Hypothyroidism, unspecified: Secondary | ICD-10-CM | POA: Diagnosis not present

## 2017-11-17 DIAGNOSIS — I2782 Chronic pulmonary embolism: Secondary | ICD-10-CM

## 2017-11-17 DIAGNOSIS — Z7901 Long term (current) use of anticoagulants: Secondary | ICD-10-CM

## 2017-11-17 DIAGNOSIS — R413 Other amnesia: Secondary | ICD-10-CM | POA: Diagnosis not present

## 2017-11-17 DIAGNOSIS — Z86718 Personal history of other venous thrombosis and embolism: Secondary | ICD-10-CM

## 2017-11-17 DIAGNOSIS — Z86711 Personal history of pulmonary embolism: Secondary | ICD-10-CM

## 2017-11-17 DIAGNOSIS — I825Y1 Chronic embolism and thrombosis of unspecified deep veins of right proximal lower extremity: Secondary | ICD-10-CM

## 2017-11-17 DIAGNOSIS — D6859 Other primary thrombophilia: Secondary | ICD-10-CM | POA: Diagnosis not present

## 2017-11-17 DIAGNOSIS — D689 Coagulation defect, unspecified: Secondary | ICD-10-CM

## 2017-11-17 DIAGNOSIS — Z79890 Hormone replacement therapy (postmenopausal): Secondary | ICD-10-CM

## 2017-11-17 NOTE — Patient Instructions (Signed)
Return visit 6 months  New patient referral to Dr Julieanne Manson at [facility] for 2020. 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

## 2017-11-17 NOTE — Progress Notes (Signed)
Hematology and Oncology Follow Up Visit  Paul Ryan [id number] 1945-10-30 72 y.o. 11/17/2017 11:23 AM   Principle Diagnosis: Encounter Diagnoses  Name Primary?  . Chronic pulmonary embolism without acute cor pulmonale, unspecified pulmonary embolism type ([place]) Yes  . 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 Coagulopathy ([place])   . Hereditary protein S deficiency ([place])      Interim History:  Last week he had an aching sensation involving his right leg.  This has since resolved.  No calf pain or swelling above his baseline.  He remains on full dose warfarin anticoagulation.  INR is in consistent normal to slightly increased range. He denies any dyspnea, chest pain, or palpitations.  He is becoming increasingly concerned about forgetfulness.  He has had a complete neurologic evaluation by a neurologist on September 21, 2017.  This included a normal mental status score (MOCA) of 28/30.  MRI of the brain was ordered but not yet done.  A previous study done for the same reason in October 2007 showed mild nonspecific white matter changes. He continues to function at a high level.  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 He is under increasing stress now the main caregiver for his wife with cancer currently in remission but with multiple GI symptoms due to extensive surgery. They have 1 daughter who lives in [place] and continues to be a support.   Medications: reviewed  Allergies: No Known Allergies  Review of Systems: See interim history Remaining ROS negative:   Physical Exam: Blood pressure 105/68, pulse (!) 54, temperature 97.8 F (36.6 C), temperature source Oral, height 5\' 10"  (1.778 m), weight 164 lb 12.8 oz (74.8 kg), SpO2 97 %. Wt Readings from Last 3 Encounters:  11/17/17 164 lb 12.8 oz (74.8 kg)  11/05/17 162 lb 6.4 oz (73.7 kg)  09/21/17 161 lb (73 kg)     General  appearance: Thin Asian man HENNT: Pharynx no erythema, exudate, mass, or ulcer. No thyromegaly or thyroid nodules Lymph nodes: No cervical, supraclavicular, or axillary lymphadenopathy Breasts:  Lungs: Clear to auscultation, resonant to percussion throughout Heart: Regular rhythm, no murmur, no gallop, no rub, no click, no edema Abdomen: Soft, nontender, normal bowel sounds, no mass, no organomegaly Extremities: No edema, no calf tenderness Calf measurements: Right  36 cm, left 33.5. Ankle measurements: Right 20 cm.  Left 19.5 Musculoskeletal: no joint deformities GU:  Vascular: Carotid pulses 2+, no bruits, distal pulses: Dorsalis pedis 1+ right, nonpalpable on the left Neurologic: Alert, oriented, PERRLA, , cranial nerves grossly normal, motor strength 5 over 5, reflexes 1+ symmetric, upper body coordination normal, gait normal, vibration sensation is normal in both hands and minimally decreased soft tissues dorsa of the feet Skin: No rash or ecchymosis  Lab Results: CBC W/Diff    Component Value Date/Time   WBC 6.2 11/05/2017 0959   RBC 5.17 11/05/2017 0959   HGB 15.5 11/05/2017 0959   HGB 15.3 06/10/2016 1033   HGB 15.2 05/17/2013 0902   HCT 46.5 11/05/2017 0959   HCT 45.0 06/10/2016 1033   HCT 44.6 05/17/2013 0902   PLT 209.0 11/05/2017 0959   PLT 216 06/10/2016 1033   MCV 90.0 11/05/2017 0959   MCV 90 06/10/2016 1033   MCV 89.4 05/17/2013 0902   MCH 30.5 06/10/2016 1033   MCH 31.1 09/19/2014 1135   MCHC 33.4 11/05/2017 0959  RDW 14.4 11/05/2017 0959   RDW 14.7 06/10/2016 1033   RDW 13.7 05/17/2013 0902   LYMPHSABS 1.4 04/16/2017 1659   LYMPHSABS 2.2 06/10/2016 1033   LYMPHSABS 1.7 05/17/2013 0902   MONOABS 0.5 04/16/2017 1659   MONOABS 0.4 05/17/2013 0902   EOSABS 0.2 04/16/2017 1659   EOSABS 0.2 06/10/2016 1033   BASOSABS 0.0 04/16/2017 1659   BASOSABS 0.0 06/10/2016 1033   BASOSABS 0.0 05/17/2013 0902     Chemistry      Component Value Date/Time   NA 139  11/05/2017 0959   NA 143 11/16/2012 1145   K 5.1 11/05/2017 0959   K 4.4 11/16/2012 1145   CL 104 11/05/2017 0959   CO2 29 11/05/2017 0959   CO2 27 11/16/2012 1145   BUN 16 11/05/2017 0959   BUN 16.2 11/16/2012 1145   CREATININE 0.94 11/05/2017 0959   CREATININE 0.90 03/21/2014 1521   CREATININE 1.1 11/16/2012 1145      Component Value Date/Time   CALCIUM 9.4 11/05/2017 0959   CALCIUM 9.3 11/16/2012 1145   ALKPHOS 53 11/05/2017 0959   ALKPHOS 48 11/16/2012 1145   AST 19 11/05/2017 0959   AST 23 11/16/2012 1145   ALT 20 11/05/2017 0959   ALT 40 11/16/2012 1145   BILITOT 0.7 11/05/2017 0959   BILITOT 0.75 11/16/2012 1145       Radiological Studies: No results found.  Impression: 1.  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  History of pulmonary emboli and right lower extremity DVT secondary to #1.  3.  Chronic warfarin anticoagulation in view of #1 and #2.  4.  Hypothyroid on replacement  5.  Problems with memory loss In the absence of a specific diagnosis he is reluctant to start any medication such as Aricept.  I tried to reassure him as best as I could that many of the changes he is experiencing are common and may just be age-related.   CC: Patient Care Team: [name], MD as PCP - General (Family Medicine) Roena Malady, MD (Ophthalmology) Forde Dandy, PharmD as Pharmacist (Pharmacist)   Murriel Hopper, MD, [place]  Hematology-Oncology/Internal Medicine     9/17/201911:23 AM

## 2017-11-18 NOTE — Progress Notes (Signed)
Reviewed Thanks DrG 

## 2017-11-18 NOTE — Progress Notes (Signed)
Anticoagulation Management Paul Ryan a 72 y.o.malewhocontacted clinical pharmacist for warfarin monitoring. Patient has a home INR meter.  Indication:Chronic anticoagulation [Z79.01], Coagulopathy (Paul Ryan) [[name]], DVT (Paul Ryan) [182.409], Pulmonary Embolism (Paul Ryan) [I126.99], Hereditary coagulopathy.  Duration:indefinite Supervising physician:James Perdido  Anticoagulation Clinic Visit History: [name] notreport signs/symptoms of bleeding or thromboembolism. Patient does report starting niacin and cranberry recently  Anticoagulation Episode Summary    Current INR goal:   2.0-3.0  TTR:   67.8 % (3.2 y)  Next INR check:   11/23/2017  INR from last check:   3.4! (11/16/2017)  Weekly max warfarin dose:     Target end date:     INR check location:     Preferred lab:     Send INR reminders to:      Indications   Chronic anticoagulation [Z79.01] Coagulopathy (Paul Ryan) [[name]] DVT (deep venous thrombosis) (Paul Ryan) [[name]] Pulmonary embolism ([name]) [[name]] Hereditary protein S deficiency (Paul Ryan) [D68.59]       Comments:   [name]       No Known Allergies Medication Sig  b complex vitamins tablet Take 1 tablet by mouth daily.    cetirizine (ZYRTEC) 10 MG tablet TAKE 1 TABLET BY MOUTH EVERY DAY AS NEEDED FOR ALLERGIES  Cholecalciferol 4000 UNITS TABS Take 1 tablet by mouth daily.  Coenzyme Q10 (COQ10 PO) Take 2 tablets by mouth daily.  fluconazole (DIFLUCAN) 150 MG tablet 1 tab po weekly prn rash  montelukast (SINGULAIR) 10 MG tablet Take 1 tablet (10 mg total) by mouth at bedtime.  NIACIN PO Take 1 tablet by mouth 3 (three) times daily.   NON FORMULARY [facility] immune support  Red Yeast Rice Extract (RED YEAST RICE PO) Take 1,200 mg by mouth 2 (two) times daily at 10 AM and 5 PM.  tacrolimus (PROTOPIC) 0.1 % ointment Apply topically 2 (two) times daily.  thyroid (ARMOUR THYROID) 60 MG tablet 60 mg tab 1 tab po M, W, Th, F, Sun  thyroid [facility] THYROID) 90 MG tablet 90 mg   Tab Tuesday and Saturday. 60 mg tab M, W, Th, F, Sun  TURMERIC PO Take 1,200 mg by mouth daily.  warfarin (COUMADIN) 4 MG tablet TAKE 1 TABLET BY MOUTH EVERY DAY   Past Medical History:  Diagnosis Date  . Acid reflux 02/03/2016  . Allergy    seasonal  . Anxiety 12/01/2016  . Chronic anticoagulation 06/14/2013  . Coagulopathy (Paul Ryan) 05/16/2011  . Collagen vascular disease ([place])   . Cough 12/01/2016  . DVT (deep venous thrombosis) ([name])    takes coumadin  . Dysuria 02/27/2013  . Erectile dysfunction 12/28/2016  . Hereditary protein S deficiency (Paul Ryan) 05/16/2011  . Hyperglycemia 12/26/2014  . Hyperlipidemia   . Hypothyroid 06/10/2011  . Insomnia 04/24/2016  . Left hip pain 12/17/2010  . MCI (mild cognitive impairment) 07/22/2016  . Nasal lesion 07/22/2016  . Polymyalgia (Paul Ryan) 03/21/2014  . 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 Preventative health care 12/17/2010  . Shingles 1982   right abdominal wall  . Staph skin infection 07/22/2016  . Thyroid nodule 11/29/2014  . Urinary hesitancy 09/30/2016  . Vitamin D deficiency 10/14/2011   Social History   Socioeconomic History  . Marital status: Married    Spouse name: Not on file  . Number of children: Not on file  . Years of education: Not on file  . Highest education level: Not on file  Occupational History  . Not on file  Social Needs  . Financial resource strain: Not on file  .  Food insecurity:    Worry: Not on file    Inability: Not on file  . Transportation needs:    Medical: Not on file    Non-medical: Not on file  Tobacco Use  . Smoking status: Former Smoker    Packs/day: 1.00    Years: 20.00    Pack years: 20.00    Types: Cigarettes    Last attempt to quit: 03/03/1980    Years since quitting: 37.7  . Smokeless tobacco: Never Used  Substance and Sexual Activity  . Alcohol use: No    Alcohol/week: 0.0 standard drinks  . Drug use: No  . Sexual activity: Not on file  Lifestyle  . Physical activity:    Days per  week: Not on file    Minutes per session: Not on file  . Stress: Not on file  Relationships  . Social connections:    Talks on phone: Not on file    Gets together: Not on file    Attends religious service: Not on file    Active member of club or organization: Not on file    Attends meetings of clubs or organizations: Not on file    Relationship status: Not on file  Other Topics Concern  . Not on file  Social History Narrative  . Not on file   Family History  Problem Relation Age of Onset  . Cancer Mother        unknown- everywhere  . Cancer Sister        breast   ASSESSMENT Recent Results: Lab Results  Component Value Date   INR 3.4 (A) 11/16/2017   INR 2.7 10/29/2017   INR 3.5 (A) 10/21/2017   PROTIME 15.6 (H) 05/17/2013   Anticoagulation Dosing: Description   Dr. Beryle Beams patient     INR today: Supratherapeutic  PLAN Weekly dose was decreased by 7% to 26 mg per week, re-check INR 1 week  Patient advised to contact clinic or seek medical attention if signs/symptoms of bleeding or thromboembolism occur.  Patient verbalized understanding by repeating back information and was advised to contact me if further medication-related questions arise. Patient was also provided an information handout.  Follow-up Return in about 1 week (around 11/23/2017).  [name]

## 2017-11-18 NOTE — Progress Notes (Signed)
Reviewed thx drG 

## 2017-12-01 ENCOUNTER — Encounter: Payer: Self-pay | Admitting: Pharmacist

## 2017-12-01 DIAGNOSIS — Z7901 Long term (current) use of anticoagulants: Secondary | ICD-10-CM

## 2017-12-01 DIAGNOSIS — D6859 Other primary thrombophilia: Secondary | ICD-10-CM

## 2017-12-01 DIAGNOSIS — I825Y1 Chronic embolism and thrombosis of unspecified deep veins of right proximal lower extremity: Secondary | ICD-10-CM

## 2017-12-01 DIAGNOSIS — I2782 Chronic pulmonary embolism: Secondary | ICD-10-CM

## 2017-12-01 DIAGNOSIS — D689 Coagulation defect, unspecified: Secondary | ICD-10-CM

## 2017-12-01 LAB — POCT INR: INR: 3.6 — AB (ref 2.0–3.0)

## 2017-12-03 NOTE — Addendum Note (Signed)
Addended by: Forde Dandy on: 12/03/2017 08:06 AM   Modules accepted: Level of Service

## 2017-12-03 NOTE — Progress Notes (Signed)
Anticoagulation Management Paul Ryan a 72 y.o.malewhocontacted clinical pharmacist for warfarin monitoring. Patient has a home INR meter.  Indication:Chronic anticoagulation [Z79.01], Coagulopathy ([place]) [[name]], DVT ([place]) [182.409], Pulmonary Embolism ([place]) [I126.99], Hereditary coagulopathy.  Duration:indefinite Supervising physician:James [place]  Anticoagulation Clinic Visit History: [name] notreport signs/symptoms of bleeding or thromboembolism. Patient does report starting niacin and cranberry recently  Anticoagulation Episode Summary    Current INR goal:   2.0-3.0  TTR:   67.0 % (3.3 y)  Next INR check:   12/09/2017  INR from last check:   3.6! 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    cetirizine (ZYRTEC) 10 MG tablet TAKE 1 TABLET BY MOUTH EVERY DAY AS NEEDED FOR ALLERGIES  Cholecalciferol 4000 UNITS TABS Take 1 tablet by mouth daily.  Coenzyme Q10 (COQ10 PO) Take 2 tablets by mouth daily.  fluconazole (DIFLUCAN) 150 MG tablet 1 tab po weekly prn rash  montelukast (SINGULAIR) 10 MG tablet Take 1 tablet (10 mg total) by mouth at bedtime.  NIACIN PO Take 1 tablet by mouth 3 (three) times daily.   NON FORMULARY [facility] immune support  Red Yeast Rice Extract (RED YEAST RICE PO) Take 1,200 mg by mouth 2 (two) times daily at 10 AM and 5 PM.  tacrolimus (PROTOPIC) 0.1 % ointment Apply topically 2 (two) times daily.  thyroid (ARMOUR THYROID) 60 MG tablet 60 mg tab 1 tab po M, W, Th, F, Sun  thyroid [facility] THYROID) 90 MG tablet 90 mg   Tab Tuesday and Saturday. 60 mg tab M, W, Th, F, Sun  TURMERIC PO Take 1,200 mg by mouth daily.  warfarin (COUMADIN) 4 MG tablet TAKE 1 TABLET BY MOUTH EVERY DAY   Past Medical History:  Diagnosis Date  . Acid reflux 02/03/2016  . Allergy    seasonal  . Anxiety 12/01/2016  . Chronic anticoagulation 06/14/2013  . Coagulopathy ([place]) 05/16/2011  . Collagen vascular disease ([place])   . Cough 12/01/2016  . DVT (deep venous thrombosis) ([name])    takes coumadin  . Dysuria 02/27/2013  . Erectile dysfunction 12/28/2016  . 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 Hyperglycemia 12/26/2014  . Hyperlipidemia   . Hypothyroid 06/10/2011  . Insomnia 04/24/2016  . Left hip pain 12/17/2010  . MCI (mild cognitive impairment) 07/22/2016  . Nasal lesion 07/22/2016  . Polymyalgia ([place]) 03/21/2014  . 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 Preventative health care 12/17/2010  . Shingles 1982   right abdominal wall  . Staph skin infection 07/22/2016  . Thyroid nodule 11/29/2014  . Urinary hesitancy 09/30/2016  . Vitamin D deficiency 10/14/2011   Social History   Socioeconomic History  . Marital status: Married    Spouse name: Not on file  . Number of children: Not on file  . Years of education: Not on file  . Highest education level: Not on file  Occupational History  . Not on file  Social Needs  . Financial resource strain: Not on file  .  Food insecurity:    Worry: Not on file    Inability: Not on file  . Transportation needs:    Medical: Not on file    Non-medical: Not on file  Tobacco Use  . Smoking status: Former Smoker    Packs/day: 1.00    Years: 20.00    Pack years: 20.00    Types: Cigarettes    Last attempt to quit: 03/03/1980    Years since quitting: 37.7  . Smokeless tobacco: Never Used  Substance and Sexual Activity  . Alcohol use: No    Alcohol/week: 0.0 standard drinks  . Drug use: No  . Sexual activity: Not on file  Lifestyle  . Physical activity:    Days per  week: Not on file    Minutes per session: Not on file  . Stress: Not on file  Relationships  . Social connections:    Talks on phone: Not on file    Gets together: Not on file    Attends religious service: Not on file    Active member of club or organization: Not on file    Attends meetings of clubs or organizations: Not on file    Relationship status: Not on file  Other Topics Concern  . Not on file  Social History Narrative  . Not on file   Family History  Problem Relation Age of Onset  . Cancer Mother        unknown- everywhere  . Cancer Sister        breast   ASSESSMENT Recent Results: Lab Results  Component Value Date   INR 3.6 (A) 12/01/2017   INR 3.4 (A) 11/16/2017   INR 2.7 10/29/2017   PROTIME 15.6 (H) 05/17/2013   Anticoagulation Dosing: Description   Dr. Beryle Beams patient     INR today: Supratherapeutic  PLAN Weekly dose was decreased by 8% to 24 mg per week  There are no Patient Instructions on file for this visit. Patient advised to contact clinic or seek medical attention if signs/symptoms of bleeding or thromboembolism occur.  Patient verbalized understanding by repeating back information and was advised to contact me if further medication-related questions arise. Patient was also provided an information handout.  Follow-up Return in about 1 week (around 12/08/2017).  [name]

## 2017-12-03 NOTE — Progress Notes (Signed)
Reviewed thx DrG 

## 2017-12-09 ENCOUNTER — Ambulatory Visit: Payer: Medicare Other | Admitting: Pharmacist

## 2017-12-09 DIAGNOSIS — D689 Coagulation defect, unspecified: Secondary | ICD-10-CM

## 2017-12-09 DIAGNOSIS — I82409 Acute embolism and thrombosis of unspecified deep veins of unspecified lower extremity: Secondary | ICD-10-CM

## 2017-12-09 DIAGNOSIS — Z5181 Encounter for therapeutic drug level monitoring: Secondary | ICD-10-CM

## 2017-12-09 DIAGNOSIS — I2782 Chronic pulmonary embolism: Secondary | ICD-10-CM

## 2017-12-09 DIAGNOSIS — D6859 Other primary thrombophilia: Secondary | ICD-10-CM

## 2017-12-09 DIAGNOSIS — Z7901 Long term (current) use of anticoagulants: Secondary | ICD-10-CM | POA: Diagnosis not present

## 2017-12-09 DIAGNOSIS — I825Y1 Chronic embolism and thrombosis of unspecified deep veins of right proximal lower extremity: Secondary | ICD-10-CM

## 2017-12-09 DIAGNOSIS — I2699 Other pulmonary embolism without acute cor pulmonale: Secondary | ICD-10-CM

## 2017-12-09 LAB — POCT INR: INR: 2.2 (ref 2.0–3.0)

## 2017-12-09 NOTE — Progress Notes (Signed)
Reviewed Thanks DrG 

## 2017-12-09 NOTE — Patient Instructions (Signed)
It was great seeing you today, Paul Ryan. We hope you have a great time in [place]. While you are in [place],  If your INR is less than 2.0, take 1 dose of Lovenox 120 mg, and increase your warfarin dose by 1/2 tablet one time, until further instructions are given If your INR is between 3.0 and 4.5, decrease your warfarin dose by 1/2 tablet one time until further instructions are given If your INR is greater than 4.5, hold warfarin (do not take) until further instructions are given

## 2017-12-09 NOTE — Progress Notes (Signed)
Anticoagulation Management Paul Ryan is a 72 y.o. male who reports to the clinic for monitoring of warfarin treatment.    Indication: Chronic anticoagulation [Z79.01], Coagulopathy ([name]) [[name]], DVT ([place]) [182.409], Pulmonary Embolism ([place]) [I126.99], Hereditary coagulopathy.  Duration: indefinite Supervising physician: Murriel Hopper  Anticoagulation Clinic Visit History: Patient does not report signs/symptoms of bleeding or thromboembolism Patient denies any recent changes since last visit Anticoagulation Episode Summary    Current INR goal:   2.0-3.0  TTR:   66.9 % (3.3 y)  Next INR check:   12/23/2017  INR from last check:   2.2 (12/09/2017)  Weekly max warfarin dose:     Target end date:     INR check location:     Preferred lab:     Send INR reminders to:      Indications   Chronic anticoagulation [Z79.01] Coagulopathy ([place]) [[name]] DVT (deep venous thrombosis) ([name]) [[name]] Pulmonary embolism ([place]) [[name]] 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 Authorizing Provider  b complex vitamins tablet Take 1 tablet by mouth daily.     Yes [provider]  cetirizine (ZYRTEC) 10 MG tablet TAKE 1 TABLET BY MOUTH EVERY DAY AS NEEDED FOR ALLERGIES 05/26/17  Yes [name], MD  Cholecalciferol 4000 UNITS TABS Take 1 tablet by mouth daily.   Yes [provider]  Coenzyme Q10 (COQ10 PO) Take 2 tablets by mouth daily.   Yes [provider]  Cranberry 250 MG TABS Take 1 tablet by mouth daily.   Yes [provider]  montelukast (SINGULAIR) 10 MG tablet Take 1 tablet (10 mg total) by mouth at bedtime. 06/16/17  Yes [name], MD  NIACIN PO Take 1 tablet by mouth 3 (three) times daily.    Yes [provider]  Red Yeast Rice Extract (RED YEAST RICE PO) Take 1,200 mg by mouth 2 (two) times  daily at 10 AM and 5 PM.   Yes [provider]  thyroid (ARMOUR THYROID) 60 MG tablet 60 mg tab 1 tab po M, W, Th, F, Sun 06/18/17  Yes [name], MD  thyroid [facility] THYROID) 90 MG tablet 90 mg  Tab Tuesday and Saturday. 60 mg tab M, W, Th, F, Sun 06/18/17  Yes [name], MD  TURMERIC PO Take 1,200 mg by mouth daily.   Yes [provider]  warfarin (COUMADIN) 4 MG tablet TAKE 1 TABLET BY MOUTH EVERY DAY 10/14/17  Yes Annia Belt, MD  fluconazole (DIFLUCAN) 150 MG tablet 1 tab po weekly prn rash Patient not taking: Reported on 12/09/2017 11/05/17   [name], MD  NON FORMULARY [facility] immune support    [provider]  tacrolimus (PROTOPIC) 0.1 % ointment Apply topically 2 (two) times daily. Patient not taking: Reported on 12/09/2017 02/22/13   [name], MD   Past Medical History:  Diagnosis Date  . Acid reflux 02/03/2016  . Allergy    seasonal  . Anxiety 12/01/2016  . Chronic anticoagulation 06/14/2013  . Coagulopathy ([place]) 05/16/2011  . Collagen vascular disease ([place])   . Cough 12/01/2016  . DVT (deep venous thrombosis) ([name])    takes coumadin  . Dysuria 02/27/2013  . Erectile dysfunction 12/28/2016  . 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 Hyperglycemia 12/26/2014  .  Hyperlipidemia   . Hypothyroid 06/10/2011  . Insomnia 04/24/2016  . Left hip pain 12/17/2010  . MCI (mild cognitive impairment) 07/22/2016  . Nasal lesion 07/22/2016  . Polymyalgia ([place]) 03/21/2014  . 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 Preventative health care 12/17/2010  . Shingles 1982   right abdominal wall  . Staph skin infection 07/22/2016  . Thyroid nodule 11/29/2014  . Urinary hesitancy 09/30/2016  . Vitamin D deficiency 10/14/2011   Social History   Socioeconomic History  . Marital status: Married    Spouse name: Not on file  . Number of children: Not on file  . Years of education: Not on file  . Highest education level: Not on file   Occupational History  . Not on file  Social Needs  . Financial resource strain: Not on file  . Food insecurity:    Worry: Not on file    Inability: Not on file  . Transportation needs:    Medical: Not on file    Non-medical: Not on file  Tobacco Use  . Smoking status: Former Smoker    Packs/day: 1.00    Years: 20.00    Pack years: 20.00    Types: Cigarettes    Last attempt to quit: 03/03/1980    Years since quitting: 37.7  . Smokeless tobacco: Never Used  Substance and Sexual Activity  . Alcohol use: No    Alcohol/week: 0.0 standard drinks  . Drug use: No  . Sexual activity: Not on file  Lifestyle  . Physical activity:    Days per week: Not on file    Minutes per session: Not on file  . Stress: Not on file  Relationships  . Social connections:    Talks on phone: Not on file    Gets together: Not on file    Attends religious service: Not on file    Active member of club or organization: Not on file    Attends meetings of clubs or organizations: Not on file    Relationship status: Not on file  Other Topics Concern  . Not on file  Social History Narrative  . Not on file   Family History  Problem Relation Age of Onset  . Cancer Mother        unknown- everywhere  . Cancer Sister        breast    ASSESSMENT Recent Results: The most recent result is correlated with 24 mg per week: Lab Results  Component Value Date   INR 2.2 12/09/2017   INR 3.6 (A) 12/01/2017   INR 3.4 (A) 11/16/2017   PROTIME 15.6 (H) 05/17/2013    Anticoagulation Dosing: Description   Dr. Beryle Beams patient     INR today: Therapeutic  PLAN Weekly dose was unchanged. Will continue 24 mg per week (warfarin 4mg  every day except 2mg  on Tuesdays and Thursdays)   Patient Instructions  It was great seeing you today, Mr. Latterell. We hope you have a great time in [place]. While you are in [place],  If your INR is less than 2.0, take 1 dose of Lovenox 120 mg, and increase your warfarin dose by 1/2  tablet one time, until further instructions are given If your INR is between 3.0 and 4.5, decrease your warfarin dose by 1/2 tablet one time until further instructions are given If your INR is greater than 4.5, hold warfarin (do not take) until further instructions are given  Patient advised to contact clinic or seek medical attention if signs/symptoms  of bleeding or thromboembolism occur.  Patient verbalized understanding by repeating back information and was advised to contact me if further medication-related questions arise. Patient was also provided an information handout.  Follow-up Return in about 2 weeks (around 12/23/2017).  Gwenlyn Found, Sherian Rein D PGY1 Pharmacy Resident  Phone [phone] 12/09/2017   4:07 PM  20 minutes spent face-to-face with the patient during the encounter.

## 2017-12-15 LAB — PROTIME-INR

## 2017-12-21 LAB — POCT INR: INR: 1.5 — AB (ref 2.0–3.0)

## 2017-12-22 ENCOUNTER — Encounter: Payer: Self-pay | Admitting: Pharmacist

## 2017-12-22 DIAGNOSIS — D689 Coagulation defect, unspecified: Secondary | ICD-10-CM

## 2017-12-22 DIAGNOSIS — I825Y1 Chronic embolism and thrombosis of unspecified deep veins of right proximal lower extremity: Secondary | ICD-10-CM

## 2017-12-22 DIAGNOSIS — D6859 Other primary thrombophilia: Secondary | ICD-10-CM

## 2017-12-22 DIAGNOSIS — Z7901 Long term (current) use of anticoagulants: Secondary | ICD-10-CM

## 2017-12-22 DIAGNOSIS — I2782 Chronic pulmonary embolism: Secondary | ICD-10-CM

## 2017-12-22 NOTE — Progress Notes (Signed)
Reviewed Thanks He is currently back in [place]

## 2017-12-22 NOTE — Progress Notes (Signed)
Anticoagulation Management Paul Ryan a 72 y.o.malewho contacted clinical pharmacist for warfarin monitoring. Patient has a home INR meter.  Indication:Chronic anticoagulation [Z79.01], Coagulopathy (Paul Ryan) [[name]], DVT (Paul Ryan) [182.409], Pulmonary Embolism (Paul Ryan) [I126.99], Hereditary coagulopathy.  Duration:indefinite Supervising physician:Paul Ryan  Anticoagulation Clinic Visit History: [name] notreport signs/symptoms of bleeding or thromboembolism. Other recent changes: patient is currently in [place], he endorses a change in diet including increased intake of vegetables.  Anticoagulation Episode Summary    Current INR goal:   2.0-3.0  TTR:   66.5 % (3.3 y)  Next INR check:   12/25/2017  INR from last check:   1.5! (12/21/2017)  Weekly max warfarin dose:     Target end date:     INR check location:     Preferred lab:     Send INR reminders to:      Indications   Chronic anticoagulation [Z79.01] Coagulopathy (Paul Ryan) [[name]] DVT (deep venous thrombosis) (Paul Ryan) [[name]] Pulmonary embolism ([name]) [[name]] Hereditary protein S deficiency (Paul Ryan) [Paul Ryan]       Comments:   DR Paul Ryan       No Known Allergies Medication Sig  b complex vitamins tablet Take 1 tablet by mouth daily.    cetirizine (ZYRTEC) 10 MG tablet TAKE 1 TABLET BY MOUTH EVERY DAY AS NEEDED FOR ALLERGIES  Cholecalciferol 4000 UNITS TABS Take 1 tablet by mouth daily.  Coenzyme Q10 (COQ10 PO) Take 2 tablets by mouth daily.  Cranberry 250 MG TABS Take 1 tablet by mouth daily.  fluconazole (DIFLUCAN) 150 MG tablet 1 tab po weekly prn rash Patient not taking: Reported on 12/09/2017  montelukast (SINGULAIR) 10 MG tablet Take 1 tablet (10 mg total) by mouth at bedtime.  NIACIN PO Take 1 tablet by mouth 3 (three) times daily.   NON FORMULARY [facility] immune support  Red Yeast Rice Extract (RED YEAST RICE PO) Take 1,200 mg by mouth 2 (two) times daily at 10 AM and 5 PM.  tacrolimus (PROTOPIC) 0.1 %  ointment Apply topically 2 (two) times daily. Patient not taking: Reported on 12/09/2017  thyroid (ARMOUR THYROID) 60 MG tablet 60 mg tab 1 tab po M, W, Th, F, Sun  thyroid [facility] THYROID) 90 MG tablet 90 mg  Tab Tuesday and Saturday. 60 mg tab M, W, Th, F, Sun  TURMERIC PO Take 1,200 mg by mouth daily.  warfarin (COUMADIN) 4 MG tablet TAKE 1 TABLET BY MOUTH EVERY DAY   Past Medical History:  Diagnosis Date  . Acid reflux 02/03/2016  . Allergy    seasonal  . Anxiety 12/01/2016  . Chronic anticoagulation 06/14/2013  . Coagulopathy (Paul Ryan) 05/16/2011  . Collagen vascular disease (Paul Ryan)   . Cough 12/01/2016  . DVT (deep venous thrombosis) ([name])    takes coumadin  . Dysuria 02/27/2013  . Erectile dysfunction 12/28/2016  . 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 Hyperglycemia 12/26/2014  . Hyperlipidemia   . Hypothyroid 06/10/2011  . Insomnia 04/24/2016  . Left hip pain 12/17/2010  . MCI (mild cognitive impairment) 07/22/2016  . Nasal lesion 07/22/2016  . Polymyalgia (Paul Ryan) 03/21/2014  . 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 Preventative health care 12/17/2010  . Shingles 1982   right abdominal wall  . Staph skin infection 07/22/2016  . Thyroid nodule 11/29/2014  . Urinary hesitancy 09/30/2016  . Vitamin D deficiency 10/14/2011   Social History   Socioeconomic History  . Marital status: Married    Spouse name: Not on file  . Number of children: Not on file  .  Years of education: Not on file  . Highest education level: Not on file  Occupational History  . Not on file  Social Needs  . Financial resource strain: Not on file  . Food insecurity:    Worry: Not on file    Inability: Not on file  . Transportation needs:    Medical: Not on file    Non-medical: Not on file  Tobacco Use  . Smoking status: Former Smoker    Packs/day: 1.00    Years: 20.00    Pack years: 20.00    Types: Cigarettes    Last attempt to quit: 03/03/1980    Years since quitting: 37.8  .  Smokeless tobacco: Never Used  Substance and Sexual Activity  . Alcohol use: No    Alcohol/week: 0.0 standard drinks  . Drug use: No  . Sexual activity: Not on file  Lifestyle  . Physical activity:    Days per week: Not on file    Minutes per session: Not on file  . Stress: Not on file  Relationships  . Social connections:    Talks on phone: Not on file    Gets together: Not on file    Attends religious service: Not on file    Active member of club or organization: Not on file    Attends meetings of clubs or organizations: Not on file    Relationship status: Not on file  Other Topics Concern  . Not on file  Social History Narrative  . Not on file   Family History  Problem Relation Age of Onset  . Cancer Mother        unknown- everywhere  . Cancer Sister        breast   ASSESSMENT Recent Results: Lab Results  Component Value Date   INR 1.5 (A) 12/21/2017   INR 2.2 12/09/2017   INR 3.6 (A) 12/01/2017   PROTIME 15.6 (H) 05/17/2013   Anticoagulation Dosing: Description   Dr. Beryle Ryan patient     INR today: Subtherapeutic  PLAN Weekly dose was increased by 17%, patient was instructed to take enoxaparin 120 mg once daily, re-check INR in 3 days.  There are no Patient Instructions on file for this visit. Patient advised to contact clinic or seek medical attention if signs/symptoms of bleeding or thromboembolism occur.  Patient verbalized understanding by repeating back information and was advised to contact me if further medication-related questions arise. Patient was also provided an information handout.  Follow-up 12/25/17  [name]

## 2017-12-25 LAB — POCT INR: INR: 1.8 — AB (ref 2.0–3.0)

## 2017-12-26 ENCOUNTER — Telehealth (INDEPENDENT_AMBULATORY_CARE_PROVIDER_SITE_OTHER): Payer: Medicare Other | Admitting: Pharmacist

## 2017-12-26 DIAGNOSIS — D689 Coagulation defect, unspecified: Secondary | ICD-10-CM

## 2017-12-26 DIAGNOSIS — Z7901 Long term (current) use of anticoagulants: Secondary | ICD-10-CM | POA: Diagnosis not present

## 2017-12-26 DIAGNOSIS — I825Y1 Chronic embolism and thrombosis of unspecified deep veins of right proximal lower extremity: Secondary | ICD-10-CM

## 2017-12-26 DIAGNOSIS — D6859 Other primary thrombophilia: Secondary | ICD-10-CM | POA: Diagnosis not present

## 2017-12-26 DIAGNOSIS — Z5181 Encounter for therapeutic drug level monitoring: Secondary | ICD-10-CM

## 2017-12-26 DIAGNOSIS — I2782 Chronic pulmonary embolism: Secondary | ICD-10-CM

## 2017-12-26 MED ORDER — ENOXAPARIN SODIUM 120 MG/0.8ML ~~LOC~~ SOLN: 120.0000 mg | SUBCUTANEOUS | 3 refills | Status: DC

## 2017-12-26 NOTE — Progress Notes (Signed)
Anticoagulation Management Paul Ryan a 72 y.o.malewhocontacted clinical pharmacist for warfarin monitoring. Patient has a home INR meter.  Indication:Chronic anticoagulation [Z79.01], Coagulopathy ([place]) [[name]], DVT ([place]) [182.409], Pulmonary Embolism ([place]) [I126.99], Hereditary coagulopathy.  Duration:indefinite Supervising physician:James [place]  Anticoagulation Clinic Visit History: Patientis currently in [place] and reports dietary changes (increased vitamin K intake), no symptoms of concern  Anticoagulation Episode Summary    Current INR goal:   2.0-3.0  TTR:   66.3 % (3.3 y)  Next INR check:   12/27/2017  INR from last check:   1.8! 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    cetirizine (ZYRTEC) 10 MG tablet TAKE 1 TABLET BY MOUTH EVERY DAY AS NEEDED FOR ALLERGIES  Cholecalciferol 4000 UNITS TABS Take 1 tablet by mouth daily.  Coenzyme Q10 (COQ10 PO) Take 2 tablets by mouth daily.  Cranberry 250 MG TABS Take 1 tablet by mouth daily.  enoxaparin (LOVENOX) 120 MG/0.8ML injection Inject 0.8 mLs (120 mg total) into the skin daily.  fluconazole (DIFLUCAN) 150 MG tablet 1 tab po weekly prn rash Patient not taking: Reported on 12/09/2017  montelukast (SINGULAIR) 10 MG tablet Take 1 tablet (10 mg total) by mouth at bedtime.  NIACIN PO Take 1 tablet by mouth 3 (three) times daily.   NON FORMULARY [facility] immune support  Red Yeast Rice Extract (RED YEAST RICE PO) Take 1,200 mg by mouth 2 (two) times daily at 10 AM and 5 PM.  tacrolimus  (PROTOPIC) 0.1 % ointment Apply topically 2 (two) times daily. Patient not taking: Reported on 12/09/2017  thyroid (ARMOUR THYROID) 60 MG tablet 60 mg tab 1 tab po M, W, Th, F, Sun  thyroid [facility] THYROID) 90 MG tablet 90 mg  Tab Tuesday and Saturday. 60 mg tab M, W, Th, F, Sun  TURMERIC PO Take 1,200 mg by mouth daily.  warfarin (COUMADIN) 4 MG tablet TAKE 1 TABLET BY MOUTH EVERY DAY   Past Medical History:  Diagnosis Date  . Acid reflux 02/03/2016  . Allergy    seasonal  . Anxiety 12/01/2016  . Chronic anticoagulation 06/14/2013  . Coagulopathy ([place]) 05/16/2011  . Collagen vascular disease ([place])   . Cough 12/01/2016  . DVT (deep venous thrombosis) ([name])    takes coumadin  . Dysuria 02/27/2013  . Erectile dysfunction 12/28/2016  . 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 Hyperglycemia 12/26/2014  . Hyperlipidemia   . Hypothyroid 06/10/2011  . Insomnia 04/24/2016  . Left hip pain 12/17/2010  . MCI (mild cognitive impairment) 07/22/2016  . Nasal lesion 07/22/2016  . Polymyalgia ([place]) 03/21/2014  . 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 Preventative health care 12/17/2010  . Shingles 1982   right abdominal wall  . Staph skin infection 07/22/2016  . Thyroid nodule 11/29/2014  . Urinary hesitancy 09/30/2016  . Vitamin D deficiency 10/14/2011   Social History   Socioeconomic History  . Marital status: Married    Spouse name: Not on file  . Number  of children: Not on file  . Years of education: Not on file  . Highest education level: Not on file  Occupational History  . Not on file  Social Needs  . Financial resource strain: Not on file  . Food insecurity:    Worry: Not on file    Inability: Not on file  . Transportation needs:    Medical: Not on file    Non-medical: Not on file  Tobacco Use  . Smoking status: Former Smoker    Packs/day: 1.00    Years: 20.00    Pack years: 20.00    Types: Cigarettes    Last attempt to quit: 03/03/1980    Years since  quitting: 37.8  . Smokeless tobacco: Never Used  Substance and Sexual Activity  . Alcohol use: No    Alcohol/week: 0.0 standard drinks  . Drug use: No  . Sexual activity: Not on file  Lifestyle  . Physical activity:    Days per week: Not on file    Minutes per session: Not on file  . Stress: Not on file  Relationships  . Social connections:    Talks on phone: Not on file    Gets together: Not on file    Attends religious service: Not on file    Active member of club or organization: Not on file    Attends meetings of clubs or organizations: Not on file    Relationship status: Not on file  Other Topics Concern  . Not on file  Social History Narrative  . Not on file   Family History  Problem Relation Age of Onset  . Cancer Mother        unknown- everywhere  . Cancer Sister        breast   ASSESSMENT Recent Results: Lab Results  Component Value Date   INR 1.8 (A) 12/25/2017   INR 1.5 (A) 12/21/2017   INR 2.2 12/09/2017   PROTIME 15.6 (H) 05/17/2013   Anticoagulation Dosing: Description   Dr. Beryle Beams patient     INR today: Subtherapeutic  PLAN Weekly dose was increased by 7%, continue enoxaparin 120 mg daily until INR therapeutic.  There are no Patient Instructions on file for this visit. Patient advised to contact clinic or seek medical attention if signs/symptoms of bleeding or thromboembolism occur.  Patient verbalized understanding by repeating back information and was advised to contact me if further medication-related questions arise. Patient was also provided an information handout.  Follow-up Return in about 1 day (around 12/27/2017).  [name]

## 2017-12-29 LAB — PROTIME-INR

## 2018-01-05 ENCOUNTER — Encounter: Payer: Self-pay | Admitting: Pharmacist

## 2018-01-05 DIAGNOSIS — I825Y1 Chronic embolism and thrombosis of unspecified deep veins of right proximal lower extremity: Secondary | ICD-10-CM

## 2018-01-05 DIAGNOSIS — Z7901 Long term (current) use of anticoagulants: Secondary | ICD-10-CM

## 2018-01-05 DIAGNOSIS — D689 Coagulation defect, unspecified: Secondary | ICD-10-CM

## 2018-01-05 DIAGNOSIS — I2782 Chronic pulmonary embolism: Secondary | ICD-10-CM

## 2018-01-05 DIAGNOSIS — D6859 Other primary thrombophilia: Secondary | ICD-10-CM

## 2018-01-05 LAB — POCT INR: INR: 3 (ref 2.0–3.0)

## 2018-01-05 NOTE — Progress Notes (Signed)
Anticoagulation Management Paul Ryan a 72 y.o.malewhocontacted clinical pharmacist for warfarin monitoring. Patient has a home INR meter.  Indication:Chronic anticoagulation [Z79.01], Coagulopathy ([place]) [[name]], DVT ([place]) [182.409], Pulmonary Embolism ([place]) [I126.99], Hereditary coagulopathy.  Duration:indefinite Supervising physician:James Aniwa  Anticoagulation Clinic Visit History: [name] notreport signs/symptoms of bleeding or thromboembolism.  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    cetirizine (ZYRTEC) 10 MG tablet TAKE 1 TABLET BY MOUTH EVERY DAY AS NEEDED FOR ALLERGIES  Cholecalciferol 4000 UNITS TABS Take 1 tablet by mouth daily.  Coenzyme Q10 (COQ10 PO) Take 2 tablets by mouth daily.  Cranberry 250 MG TABS Take 1 tablet by mouth daily.  enoxaparin (LOVENOX) 120 MG/0.8ML injection Inject 0.8 mLs (120 mg total) into the skin daily.  fluconazole (DIFLUCAN) 150 MG tablet 1 tab po weekly prn rash Patient not taking: Reported on 12/09/2017  montelukast (SINGULAIR) 10 MG tablet Take 1 tablet (10 mg total) by mouth at bedtime.  NIACIN PO Take 1 tablet by mouth 3 (three) times daily.   NON FORMULARY [facility] immune support  Red Yeast Rice Extract (RED YEAST RICE PO) Take 1,200 mg by mouth 2 (two) times daily at 10 AM and 5 PM.  tacrolimus (PROTOPIC) 0.1 % ointment Apply topically 2 (two)  times daily. Patient not taking: Reported on 12/09/2017  thyroid (ARMOUR THYROID) 60 MG tablet 60 mg tab 1 tab po M, W, Th, F, Sun  thyroid [facility] THYROID) 90 MG tablet 90 mg  Tab Tuesday and Saturday. 60 mg tab M, W, Th, F, Sun  TURMERIC PO Take 1,200 mg by mouth daily.  warfarin (COUMADIN) 4 MG tablet TAKE 1 TABLET BY MOUTH EVERY DAY   Past Medical History:  Diagnosis Date  . Acid reflux 02/03/2016  . Allergy    seasonal  . Anxiety 12/01/2016  . Chronic anticoagulation 06/14/2013  . Coagulopathy ([place]) 05/16/2011  . Collagen vascular disease ([place])   . Cough 12/01/2016  . DVT (deep venous thrombosis) ([name])    takes coumadin  . Dysuria 02/27/2013  . Erectile dysfunction 12/28/2016  . 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 Hyperglycemia 12/26/2014  . Hyperlipidemia   . Hypothyroid 06/10/2011  . Insomnia 04/24/2016  . Left hip pain 12/17/2010  . MCI (mild cognitive impairment) 07/22/2016  . Nasal lesion 07/22/2016  . Polymyalgia ([place]) 03/21/2014  . 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 Preventative health care 12/17/2010  . Shingles 1982   right abdominal wall  . Staph skin infection 07/22/2016  . Thyroid nodule 11/29/2014  . Urinary hesitancy 09/30/2016  . Vitamin D deficiency 10/14/2011   Social History   Socioeconomic History  . Marital status: Married    Spouse name: Not on file  . Number of children: Not on file  . Years of  education: Not on file  . Highest education level: Not on file  Occupational History  . Not on file  Social Needs  . Financial resource strain: Not on file  . Food insecurity:    Worry: Not on file    Inability: Not on file  . Transportation needs:    Medical: Not on file    Non-medical: Not on file  Tobacco Use  . Smoking status: Former Smoker    Packs/day: 1.00    Years: 20.00    Pack years: 20.00    Types: Cigarettes    Last attempt to quit: 03/03/1980    Years since quitting: 37.8  . Smokeless tobacco: Never Used   Substance and Sexual Activity  . Alcohol use: No    Alcohol/week: 0.0 standard drinks  . Drug use: No  . Sexual activity: Not on file  Lifestyle  . Physical activity:    Days per week: Not on file    Minutes per session: Not on file  . Stress: Not on file  Relationships  . Social connections:    Talks on phone: Not on file    Gets together: Not on file    Attends religious service: Not on file    Active member of club or organization: Not on file    Attends meetings of clubs or organizations: Not on file    Relationship status: Not on file  Other Topics Concern  . Not on file  Social History Narrative  . Not on file   Family History  Problem Relation Age of Onset  . Cancer Mother        unknown- everywhere  . Cancer Sister        breast   ASSESSMENT Recent Results: Lab Results  Component Value Date   INR 3.0 01/05/2018   INR 1.8 (A) 12/25/2017   INR 1.5 (A) 12/21/2017   PROTIME 15.6 (H) 05/17/2013    INR today: Therapeutic  PLAN Weekly dose was unchanged  There are no Patient Instructions on file for this visit. Patient advised to contact clinic or seek medical attention if signs/symptoms of bleeding or thromboembolism occur.  Patient verbalized understanding by repeating back information and was advised to contact me if further medication-related questions arise. Patient was also provided an information handout.  Follow-up Return in about 2 weeks (around 01/19/2018).  [name]

## 2018-01-06 NOTE — Progress Notes (Signed)
Reviewed Thanks DrG  Is he back from [place]?

## 2018-01-18 ENCOUNTER — Ambulatory Visit (INDEPENDENT_AMBULATORY_CARE_PROVIDER_SITE_OTHER): Payer: Medicare Other | Admitting: Pharmacist

## 2018-01-18 DIAGNOSIS — I82409 Acute embolism and thrombosis of unspecified deep veins of unspecified lower extremity: Secondary | ICD-10-CM | POA: Diagnosis not present

## 2018-01-18 DIAGNOSIS — Z7901 Long term (current) use of anticoagulants: Secondary | ICD-10-CM | POA: Diagnosis not present

## 2018-01-18 DIAGNOSIS — D6859 Other primary thrombophilia: Secondary | ICD-10-CM | POA: Diagnosis not present

## 2018-01-18 DIAGNOSIS — D689 Coagulation defect, unspecified: Secondary | ICD-10-CM

## 2018-01-18 DIAGNOSIS — I2699 Other pulmonary embolism without acute cor pulmonale: Secondary | ICD-10-CM | POA: Diagnosis not present

## 2018-01-18 DIAGNOSIS — Z5181 Encounter for therapeutic drug level monitoring: Secondary | ICD-10-CM

## 2018-01-18 LAB — POCT INR: INR: 3.6 — AB (ref 2.0–3.0)

## 2018-01-18 NOTE — Progress Notes (Signed)
Anticoagulation Management Paul Ryan is a 72 y.o. male who reports to the clinic for monitoring of warfarin treatment.    Indication: Chronic anticoagulation [Z79.01], Coagulopathy ([name]) [[name]], DVT ([place]) [182.409], Pulmonary Embolism ([place]) [I126.99], Hereditary coagulopathy.  Duration: indefinite Supervising physician: Gilles Chiquito  Anticoagulation Clinic Visit History: Patient does not report signs/symptoms of bleeding or thromboembolism at this time. Other recent changes: Patient recently returned from extended visit to [place], but no other diet, medications, lifestyle changes endorsed.  Anticoagulation Episode Summary    Current INR goal:   2.0-3.0  TTR:   65.8 % (3.4 y)  Next INR check:   01/25/2018  INR from last check:   3.6! 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 Authorizing Provider  b complex vitamins tablet Take 1 tablet by mouth daily.     Yes [provider]  cetirizine (ZYRTEC) 10 MG tablet TAKE 1 TABLET BY MOUTH EVERY DAY AS NEEDED FOR ALLERGIES 05/26/17  Yes [name], MD  Cholecalciferol 4000 UNITS TABS Take 1 tablet by mouth daily.   Yes [provider]  Coenzyme Q10 (COQ10 PO) Take 2 tablets by mouth daily.   Yes [provider]  Cranberry 250 MG TABS Take 1 tablet by mouth daily.   Yes [provider]  enoxaparin (LOVENOX) 120 MG/0.8ML injection Inject 0.8 mLs (120 mg total) into the skin daily. 12/26/17  Yes Annia Belt, MD  fluconazole (DIFLUCAN) 150 MG tablet 1 tab po weekly  prn rash 11/05/17  Yes [name], MD  montelukast (SINGULAIR) 10 MG tablet Take 1 tablet (10 mg total) by mouth at bedtime. 06/16/17  Yes [name], MD  NIACIN PO Take 1 tablet by mouth 3 (three) times daily.    Yes [provider]  NON FORMULARY [place] immune support   Yes [provider]  Red Yeast Rice Extract (RED YEAST RICE PO) Take 1,200 mg by mouth 2 (two) times daily at 10 AM and 5 PM.   Yes [provider]  tacrolimus (PROTOPIC) 0.1 % ointment Apply topically 2 (two) times daily. 02/22/13  Yes [name], MD  thyroid [facility] THYROID) 60 MG tablet 60 mg tab 1 tab po M, W, Th, F, Sun 06/18/17  Yes [name], MD  thyroid [facility] THYROID) 90 MG tablet 90 mg  Tab Tuesday and Saturday. 60 mg tab M, W, Th, F, Sun 06/18/17  Yes [name], MD  TURMERIC PO Take 1,200 mg by mouth daily.   Yes [provider]  warfarin (COUMADIN) 4 MG tablet TAKE 1 TABLET BY MOUTH EVERY DAY 10/14/17  Yes Annia Belt, MD   Past Medical History:  Diagnosis Date  . Acid reflux 02/03/2016  . Allergy    seasonal  . Anxiety 12/01/2016  . Chronic anticoagulation 06/14/2013  . Coagulopathy ([place]) 05/16/2011  . Collagen vascular disease ([place])   . Cough 12/01/2016  . DVT (deep venous thrombosis) ([place])  takes coumadin  . Dysuria 02/27/2013  . Erectile dysfunction 12/28/2016  . 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 Hyperglycemia 12/26/2014  . Hyperlipidemia   . Hypothyroid 06/10/2011  . Insomnia 04/24/2016  . Left hip pain 12/17/2010  . MCI (mild cognitive impairment) 07/22/2016  . Nasal lesion 07/22/2016  . Polymyalgia ([place]) 03/21/2014  . 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 Preventative health care 12/17/2010  . Shingles 1982   right abdominal wall  . Staph skin infection 07/22/2016  . Thyroid nodule 11/29/2014  . Urinary hesitancy 09/30/2016  . Vitamin D deficiency 10/14/2011   Social History   Socioeconomic History  .  Marital status: Married    Spouse name: Not on file  . Number of children: Not on file  . Years of education: Not on file  . Highest education level: Not on file  Occupational History  . Not on file  Social Needs  . Financial resource strain: Not on file  . Food insecurity:    Worry: Not on file    Inability: Not on file  . Transportation needs:    Medical: Not on file    Non-medical: Not on file  Tobacco Use  . Smoking status: Former Smoker    Packs/day: 1.00    Years: 20.00    Pack years: 20.00    Types: Cigarettes    Last attempt to quit: 03/03/1980    Years since quitting: 37.9  . Smokeless tobacco: Never Used  Substance and Sexual Activity  . Alcohol use: No    Alcohol/week: 0.0 standard drinks  . Drug use: No  . Sexual activity: Not on file  Lifestyle  . Physical activity:    Days per week: Not on file    Minutes per session: Not on file  . Stress: Not on file  Relationships  . Social connections:    Talks on phone: Not on file    Gets together: Not on file    Attends religious service: Not on file    Active member of club or organization: Not on file    Attends meetings of clubs or organizations: Not on file    Relationship status: Not on file  Other Topics Concern  . Not on file  Social History Narrative  . Not on file   Family History  Problem Relation Age of Onset  . Cancer Mother        unknown- everywhere  . Cancer Sister        breast    ASSESSMENT Recent Results: The most recent result is correlated with 32 mg per week: Lab Results  Component Value Date   INR 3.6 (A) 01/18/2018   INR 3.0 01/05/2018   INR 1.8 (A) 12/25/2017   PROTIME 15.6 (H) 05/17/2013    Anticoagulation Dosing: Description   Take one (1) tablet of your 4 mg blue warfarin tablet by mouth daily.     INR today: Supratherapeutic  PLAN Weekly dose was decreased by 14% to 28 mg per week  Patient Instructions  Patient instructed to take medications as defined in the  Anti-coagulation Track section of this encounter.  Patient instructed to take today's dose.  Patient instructed to take one (1) tablet of your 4 mg blue warfarin tablet by mouth daily. Patient verbalized understanding of these instructions.   Patient advised to contact clinic or seek medical attention if signs/symptoms of bleeding or thromboembolism occur.  Patient verbalized understanding by repeating back information and was advised to contact  me if further medication-related questions arise. Patient was also provided an information handout.  Follow-up Return in 1 week (on 01/25/2018) for INR Follow up at 1030.  15 minutes spent face-to-face with the patient during the encounter. 50% of time spent on education. 50% of time was spent on point of care INR testing, result interpretation, dose adjustment, and documentation in [email or web address]  Thank you for allowing pharmacy to be a part of this patient's care.  Leron Croak, PharmD PGY1 Pharmacy Resident Phone: [phone]

## 2018-01-18 NOTE — Progress Notes (Signed)
Patient was seen in clinic with Dr. Fanning, PharmD, PGY1 pharmacy resident. I agree with the assessment and plan of care documented.   

## 2018-01-18 NOTE — Patient Instructions (Signed)
Patient instructed to take medications as defined in the Anti-coagulation Track section of this encounter.  Patient instructed to take today's dose.  Patient instructed to take one (1) tablet of your 4 mg blue warfarin tablet by mouth daily. Patient verbalized understanding of these instructions.

## 2018-01-19 ENCOUNTER — Ambulatory Visit: Payer: Medicare Other | Admitting: Pharmacist

## 2018-01-21 ENCOUNTER — Other Ambulatory Visit (HOSPITAL_COMMUNITY): Payer: Self-pay | Admitting: Specialist

## 2018-01-21 DIAGNOSIS — G459 Transient cerebral ischemic attack, unspecified: Secondary | ICD-10-CM

## 2018-01-22 ENCOUNTER — Other Ambulatory Visit: Payer: Self-pay | Admitting: Specialist

## 2018-01-22 DIAGNOSIS — G629 Polyneuropathy, unspecified: Secondary | ICD-10-CM

## 2018-01-22 DIAGNOSIS — R4189 Other symptoms and signs involving cognitive functions and awareness: Secondary | ICD-10-CM

## 2018-01-22 DIAGNOSIS — G459 Transient cerebral ischemic attack, unspecified: Secondary | ICD-10-CM

## 2018-01-22 DIAGNOSIS — D6859 Other primary thrombophilia: Secondary | ICD-10-CM

## 2018-01-23 ENCOUNTER — Encounter: Payer: Self-pay | Admitting: Family Medicine

## 2018-01-25 ENCOUNTER — Other Ambulatory Visit: Payer: Self-pay | Admitting: Specialist

## 2018-01-25 ENCOUNTER — Ambulatory Visit: Payer: Medicare Other

## 2018-01-25 DIAGNOSIS — R4189 Other symptoms and signs involving cognitive functions and awareness: Secondary | ICD-10-CM

## 2018-01-25 DIAGNOSIS — G459 Transient cerebral ischemic attack, unspecified: Secondary | ICD-10-CM

## 2018-01-25 DIAGNOSIS — G629 Polyneuropathy, unspecified: Secondary | ICD-10-CM

## 2018-01-26 ENCOUNTER — Ambulatory Visit (HOSPITAL_COMMUNITY): Payer: Medicare Other

## 2018-01-26 MED ORDER — THYROID 90 MG PO TABS: ORAL_TABLET | ORAL | 1 refills | Status: DC

## 2018-01-27 NOTE — Progress Notes (Signed)
I reviewed the anticoagulation clinic note.  INR was elevated and dose was decreased.  Patient on [facility] for VTE, indefinite therapy.

## 2018-01-28 ENCOUNTER — Encounter: Payer: Self-pay | Admitting: Pharmacist

## 2018-01-28 DIAGNOSIS — D6859 Other primary thrombophilia: Secondary | ICD-10-CM

## 2018-01-28 DIAGNOSIS — D689 Coagulation defect, unspecified: Secondary | ICD-10-CM

## 2018-01-28 DIAGNOSIS — I82599 Chronic embolism and thrombosis of other specified deep vein of unspecified lower extremity: Secondary | ICD-10-CM

## 2018-01-28 DIAGNOSIS — I2699 Other pulmonary embolism without acute cor pulmonale: Secondary | ICD-10-CM

## 2018-01-28 DIAGNOSIS — Z7901 Long term (current) use of anticoagulants: Secondary | ICD-10-CM

## 2018-01-28 LAB — POCT INR: INR: 3.3 — AB (ref 2.0–3.0)

## 2018-02-01 ENCOUNTER — Encounter: Payer: Self-pay | Admitting: Oncology

## 2018-02-01 ENCOUNTER — Ambulatory Visit: Payer: Medicare Other | Admitting: Oncology

## 2018-02-01 ENCOUNTER — Other Ambulatory Visit: Payer: Self-pay

## 2018-02-01 VITALS — BP 103/66 | HR 68 | Temp 97.9°F | Wt 161.4 lb

## 2018-02-01 DIAGNOSIS — E039 Hypothyroidism, unspecified: Secondary | ICD-10-CM | POA: Diagnosis not present

## 2018-02-01 DIAGNOSIS — Z7901 Long term (current) use of anticoagulants: Secondary | ICD-10-CM

## 2018-02-01 DIAGNOSIS — Z79890 Hormone replacement therapy (postmenopausal): Secondary | ICD-10-CM

## 2018-02-01 DIAGNOSIS — Z86711 Personal history of pulmonary embolism: Secondary | ICD-10-CM

## 2018-02-01 DIAGNOSIS — R413 Other amnesia: Secondary | ICD-10-CM

## 2018-02-01 DIAGNOSIS — Z89022 Acquired absence of left finger(s): Secondary | ICD-10-CM

## 2018-02-01 DIAGNOSIS — D6859 Other primary thrombophilia: Secondary | ICD-10-CM | POA: Diagnosis not present

## 2018-02-01 DIAGNOSIS — M353 Polymyalgia rheumatica: Secondary | ICD-10-CM

## 2018-02-01 DIAGNOSIS — Z86718 Personal history of other venous thrombosis and embolism: Secondary | ICD-10-CM

## 2018-02-01 DIAGNOSIS — Z8615 Personal history of latent tuberculosis infection: Secondary | ICD-10-CM

## 2018-02-01 DIAGNOSIS — I82599 Chronic embolism and thrombosis of other specified deep vein of unspecified lower extremity: Secondary | ICD-10-CM

## 2018-02-01 DIAGNOSIS — I2699 Other pulmonary embolism without acute cor pulmonale: Secondary | ICD-10-CM

## 2018-02-01 NOTE — Patient Instructions (Signed)
Return as needed before  April, 2020  We will transition your Hematology care to Dr Benay Spice after the [facility]. If you don't get a call by the end of January, call the cancer center at [phone]

## 2018-02-01 NOTE — Progress Notes (Signed)
Hematology and Oncology Follow Up Visit  Link Burgeson [id number] 11-Oct-1945 72 y.o. 02/01/2018 8:03 PM   Principle Diagnosis: Encounter Diagnoses  Name Primary?  . Pulmonary embolism without acute cor pulmonale, unspecified chronicity, unspecified pulmonary embolism type ([place]) Yes  . 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 He sustained a non-provoked right lower extremity DVT in June 2010. He was initially anticoagulated with Coumadin for 1 year due to the fact that he travels frequently to [place]. Protein S and protein C levels were checked when he was off Coumadin and he had a reproducible decrease in functional protein S with borderline decrease in free protein S. He was put back on Coumadin. He was changed to Xarelto in April 2015 and did well until June 2016. He developed a bronchitis with a dry cough while he was visiting [place] and then dyspnea and pleuritic chest pain. CT scan of the chest done 08/07/2014 positive for left lower lobe pulmonary embolus with a single filling defect in a left lower lobe pulmonary artery. I elected to put him back on Coumadin at that time and stop the Xarelto.  Of note in January 2016 he began to develop polymyalgia. I initially thought this was from the [place] but there was no improvement when Xarelto was temporarily discontinued. He had a significant elevation of ESR 84 mm. ANA negative, Rheumatoid factor negative. Muscle enzymes normal. ANCA negative. ACE level normal. Cyclic citrulline peptide antibodies undetectable. Sjogren antibodies undetectable. No improvement in symptoms off Crestor. He was put on a trial of prednisone by sports medicine physician Dr. Gardenia Phlegm. Symptoms did improve. Findings were not felt to be typical of polymyalgia rheumatica but he has seen a Rheumatologist.  He is currently off all prednisone.   He had to go  on treatment for latent tuberculosis with [name] and rifampin beginning in January 2017. This destabilized his Coumadin levels. He is currently back on track.  At time of recent November 17, 2017 visit he was undergoing evaluation for what he felt were progressive cognitive deficits primarily increasing forgetfulness.  Initial neurologic evaluation unremarkable.  Normal mental status score 28/30. Since that visit he had at least 2 episodes where he developed sudden vertigo and unsteady gait.  He was recently evaluated by a doctorTellez, a neurologist in [place].  MRI is planned.  A number of laboratory studies were ordered.  He just got back from [place] 2 weeks ago and will be returning to [place] again next week. His wife is slowly recovering from major surgery and chemotherapy for small bowel cancer.  Interim History: Short interim follow-up visit.  See second 2 last paragraph above. Currently no cardiorespiratory symptoms.  No recurrent leg pain or swelling.  Medications: reviewed  Allergies: No Known Allergies  Review of Systems: See interim history Remaining ROS negative:   Physical Exam: Blood pressure 103/66, pulse 68, temperature 97.9 F (36.6 C), temperature source Oral, weight 161 lb 6.4 oz (73.2 kg), SpO2 99 %. Wt Readings from Last 3 Encounters:  02/01/18 161 lb 6.4 oz (73.2 kg)  11/17/17 164 lb 12.8 oz (74.8 kg)  11/05/17 162 lb 6.4 oz (73.7 kg)     General appearance: Thin Asian man HENNT: Pharynx no erythema, exudate, mass, or ulcer. 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  No edema, no calf tenderness Musculoskeletal: no  joint deformities GU:  Vascular: Carotid pulses 2+, no bruits, Neurologic: Alert, oriented, PERRLA, optic discs sharp and vessels normal, no hemorrhage or exudate, cranial nerves grossly normal, motor strength 5 over 5, reflexes 1+ symmetric at the biceps, absent but symmetric at the knees., upper body coordination normal, gait normal, heel-to-shin normal, rapid alternating movements normal, finger-to-nose normal.  No pronator drift. Skin: No rash or ecchymosis  Lab Results: CBC W/Diff    Component Value Date/Time   WBC 6.2 11/05/2017 0959   RBC 5.17 11/05/2017 0959   HGB 15.5 11/05/2017 0959   HGB 15.3 06/10/2016 1033   HGB 15.2 05/17/2013 0902   HCT 46.5 11/05/2017 0959   HCT 45.0 06/10/2016 1033   HCT 44.6 05/17/2013 0902   PLT 209.0 11/05/2017 0959   PLT 216 06/10/2016 1033   MCV 90.0 11/05/2017 0959   MCV 90 06/10/2016 1033   MCV 89.4 05/17/2013 0902   MCH 30.5 06/10/2016 1033   MCH 31.1 09/19/2014 1135   MCHC 33.4 11/05/2017 0959   RDW 14.4 11/05/2017 0959   RDW 14.7 06/10/2016 1033   RDW 13.7 05/17/2013 0902   LYMPHSABS 1.4 04/16/2017 1659   LYMPHSABS 2.2 06/10/2016 1033   LYMPHSABS 1.7 05/17/2013 0902   MONOABS 0.5 04/16/2017 1659   MONOABS 0.4 05/17/2013 0902   EOSABS 0.2 04/16/2017 1659   EOSABS 0.2 06/10/2016 1033   BASOSABS 0.0 04/16/2017 1659   BASOSABS 0.0 06/10/2016 1033   BASOSABS 0.0 05/17/2013 0902     Chemistry      Component Value Date/Time   NA 139 11/05/2017 0959   NA 143 11/16/2012 1145   K 5.1 11/05/2017 0959   K 4.4 11/16/2012 1145   CL 104 11/05/2017 0959   CO2 29 11/05/2017 0959   CO2 27 11/16/2012 1145   BUN 16 11/05/2017 0959   BUN 16.2 11/16/2012 1145   CREATININE 0.94 11/05/2017 0959   CREATININE 0.90 03/21/2014 1521   CREATININE 1.1 11/16/2012 1145      Component Value Date/Time   CALCIUM 9.4 11/05/2017 0959   CALCIUM 9.3 11/16/2012 1145   ALKPHOS 53 11/05/2017 0959   ALKPHOS 48 11/16/2012 1145   AST 19 11/05/2017 0959    AST 23 11/16/2012 1145   ALT 20 11/05/2017 0959   ALT 40 11/16/2012 1145   BILITOT 0.7 11/05/2017 0959   BILITOT 0.75 11/16/2012 1145       Radiological Studies: No results found.  Impression:  1.  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  History of right lower extremity DVT and subsequent pulmonary emboli secondary to #1.  3.  Long-term anticoagulation with warfarin secondary to #1 and #2.      Current warfarin dose 4 mg daily.  4.  Treated latent TB  5.  Intermittent polymyalgia  6.  Self perceived cognitive deficits and recent ataxic episode.  Neurologic evaluation ongoing.  7.  Hypothyroid on replacement  CC: Patient Care Team: Blyth, Stacey A, MD as PCP - General (Family Medicine) Boehlke, Christopher S, MD (Ophthalmology) [name], [name], PharmD as Pharmacist (Pharmacist)    , MD, FACP  Hematology-Oncology/Internal Medicine     12/2/20198:03 PM 

## 2018-02-02 ENCOUNTER — Other Ambulatory Visit: Payer: Self-pay | Admitting: Family Medicine

## 2018-02-02 MED ORDER — THYROID 60 MG PO TABS: ORAL_TABLET | ORAL | 1 refills | Status: DC

## 2018-02-02 NOTE — Progress Notes (Signed)
Anticoagulation Management Woodrow Drab is a 72 y.o. male who reports to the clinic for monitoring of warfarin treatment.    Indication: Chronic anticoagulation [Z79.01], Coagulopathy ([name]) [[name]], DVT ([place]) [182.409], Pulmonary Embolism ([place]) [I126.99], Hereditary coagulopathy.  Duration: indefinite Supervising physician: Murriel Hopper  Anticoagulation Clinic Visit History: Patient does not report signs/symptoms of bleeding or thromboembolism at this time. Other recent changes: Patient recently returned from extended visit to [place], but no other diet, medications, lifestyle changes endorsed.  Anticoagulation Episode Summary    Current INR goal:   2.0-3.0  TTR:   65.3 % (3.4 y)  Next INR check:   02/09/2018  INR from last check:   3.3! 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    cetirizine (ZYRTEC) 10 MG tablet TAKE 1 TABLET BY MOUTH EVERY DAY AS NEEDED FOR ALLERGIES  Cholecalciferol 4000 UNITS TABS Take 1 tablet by mouth daily.  Coenzyme Q10 (COQ10 PO) Take 2 tablets by mouth daily.  Cranberry 250 MG TABS Take 1 tablet by mouth daily.  enoxaparin (LOVENOX) 120 MG/0.8ML injection Inject 0.8 mLs (120 mg total) into the skin daily.  fluconazole (DIFLUCAN) 150 MG tablet 1 tab po weekly prn rash  montelukast (SINGULAIR) 10 MG tablet Take 1 tablet (10 mg total) by mouth at bedtime.  NIACIN PO Take 1 tablet by mouth 3 (three) times daily.   NON FORMULARY AHCC immune support  Red Yeast Rice Extract (RED YEAST RICE PO) Take 1,200 mg by mouth 2 (two) times  daily at 10 AM and 5 PM.  tacrolimus (PROTOPIC) 0.1 % ointment Apply topically 2 (two) times daily.  thyroid (ARMOUR THYROID) 60 MG tablet 60 mg tab 1 tab po M, W, Th, F, Sun  thyroid [facility] THYROID) 90 MG tablet 90 mg  Tab Tuesday and Saturday. 60 mg tab M, W, Th, F, Sun  TURMERIC PO Take 1,200 mg by mouth daily.  warfarin (COUMADIN) 4 MG tablet TAKE 1 TABLET BY MOUTH EVERY DAY   Past Medical History:  Diagnosis Date  . Acid reflux 02/03/2016  . Allergy    seasonal  . Anxiety 12/01/2016  . Chronic anticoagulation 06/14/2013  . Coagulopathy ([place]) 05/16/2011  . Collagen vascular disease ([place])   . Cough 12/01/2016  . DVT (deep venous thrombosis) ([name])    takes coumadin  . Dysuria 02/27/2013  . Erectile dysfunction 12/28/2016  . 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 Hyperglycemia 12/26/2014  . Hyperlipidemia   . Hypothyroid 06/10/2011  . Insomnia 04/24/2016  . Left hip pain 12/17/2010  . MCI (mild cognitive impairment) 07/22/2016  . Nasal lesion 07/22/2016  . Polymyalgia ([place]) 03/21/2014  . 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 Preventative health care 12/17/2010  . Shingles 1982   right abdominal wall  . Staph skin infection 07/22/2016  . Thyroid nodule 11/29/2014  . Urinary hesitancy 09/30/2016  . Vitamin D deficiency 10/14/2011   Social History   Socioeconomic History  . Marital status: Married  Spouse name: Not on file  . Number of children: Not on file  . Years of education: Not on file  . Highest education level: Not on file  Occupational History  . Not on file  Social Needs  . Financial resource strain: Not on file  . Food insecurity:    Worry: Not on file    Inability: Not on file  . Transportation needs:    Medical: Not on file    Non-medical: Not on file  Tobacco Use  . Smoking status: Former Smoker    Packs/day: 1.00    Years: 20.00    Pack years: 20.00    Types: Cigarettes    Last attempt to quit: 03/03/1980    Years since quitting:  37.9  . Smokeless tobacco: Never Used  Substance and Sexual Activity  . Alcohol use: No    Alcohol/week: 0.0 standard drinks  . Drug use: No  . Sexual activity: Not on file  Lifestyle  . Physical activity:    Days per week: Not on file    Minutes per session: Not on file  . Stress: Not on file  Relationships  . Social connections:    Talks on phone: Not on file    Gets together: Not on file    Attends religious service: Not on file    Active member of club or organization: Not on file    Attends meetings of clubs or organizations: Not on file    Relationship status: Not on file  Other Topics Concern  . Not on file  Social History Narrative  . Not on file   Family History  Problem Relation Age of Onset  . Cancer Mother        unknown- everywhere  . Cancer Sister        breast   ASSESSMENT Lab Results  Component Value Date   INR 3.3 (A) 01/28/2018   INR 3.6 (A) 01/18/2018   INR 3.0 01/05/2018   PROTIME 15.6 (H) 05/17/2013   Anticoagulation Dosing: Description   Take one (1) tablet of your 4 mg blue warfarin tablet by mouth daily, 1.5 tablets (6 mg) on Tuesdays and Fridays     INR today: Therapeutic as recommended by neurology (INR goal 3-4 for now)  PLAN Weekly dose was unchanged   There are no Patient Instructions on file for this visit. Patient advised to contact clinic or seek medical attention if signs/symptoms of bleeding or thromboembolism occur.  Patient verbalized understanding by repeating back information and was advised to contact me if further medication-related questions arise. Patient was also provided an information handout.  Follow-up 1 week  [name]

## 2018-02-05 ENCOUNTER — Ambulatory Visit
Admission: RE | Admit: 2018-02-05 | Discharge: 2018-02-05 | Disposition: A | Discharge status: Home / Self Care | Payer: Medicare Other | Source: Ambulatory Visit | Attending: Specialist | Admitting: Specialist

## 2018-02-05 DIAGNOSIS — G459 Transient cerebral ischemic attack, unspecified: Secondary | ICD-10-CM

## 2018-02-05 DIAGNOSIS — R4189 Other symptoms and signs involving cognitive functions and awareness: Secondary | ICD-10-CM

## 2018-02-05 DIAGNOSIS — G629 Polyneuropathy, unspecified: Secondary | ICD-10-CM

## 2018-02-05 LAB — PROTIME-INR

## 2018-02-06 ENCOUNTER — Ambulatory Visit
Admission: RE | Admit: 2018-02-06 | Discharge: 2018-02-06 | Disposition: A | Discharge status: Home / Self Care | Payer: Medicare Other | Source: Ambulatory Visit | Attending: Specialist | Admitting: Specialist

## 2018-02-06 DIAGNOSIS — R4189 Other symptoms and signs involving cognitive functions and awareness: Secondary | ICD-10-CM

## 2018-02-06 DIAGNOSIS — D6859 Other primary thrombophilia: Secondary | ICD-10-CM

## 2018-02-06 DIAGNOSIS — G629 Polyneuropathy, unspecified: Secondary | ICD-10-CM

## 2018-02-06 DIAGNOSIS — G459 Transient cerebral ischemic attack, unspecified: Secondary | ICD-10-CM

## 2018-02-06 MED ORDER — GADOBENATE DIMEGLUMINE 529 MG/ML IV SOLN
15.0000 mL | Freq: Once | INTRAVENOUS | Status: AC | PRN
  Administered 2018-02-06: 15 mL via INTRAVENOUS

## 2018-02-09 ENCOUNTER — Encounter: Payer: Self-pay | Admitting: Pharmacist

## 2018-02-09 ENCOUNTER — Telehealth: Payer: Self-pay | Admitting: *Deleted

## 2018-02-09 DIAGNOSIS — I2699 Other pulmonary embolism without acute cor pulmonale: Secondary | ICD-10-CM

## 2018-02-09 DIAGNOSIS — I82599 Chronic embolism and thrombosis of other specified deep vein of unspecified lower extremity: Secondary | ICD-10-CM

## 2018-02-09 DIAGNOSIS — Z7901 Long term (current) use of anticoagulants: Secondary | ICD-10-CM

## 2018-02-09 DIAGNOSIS — D6859 Other primary thrombophilia: Secondary | ICD-10-CM

## 2018-02-09 DIAGNOSIS — D689 Coagulation defect, unspecified: Secondary | ICD-10-CM

## 2018-02-09 NOTE — Progress Notes (Addendum)
Anticoagulation Management Paul Ryan is a 72 y.o. male who reports to the clinic for monitoring of warfarin treatment.    Indication: Chronic anticoagulation [Z79.01], Coagulopathy ([name]) [[name]], DVT ([place]) [182.409], Pulmonary Embolism ([place]) [I126.99], Hereditary coagulopathy.  Duration: indefinite Supervising physician: Murriel Hopper  Anticoagulation Clinic Visit History: Patient does not report signs/symptoms of bleeding or thromboembolism at this time. 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    cetirizine (ZYRTEC) 10 MG tablet TAKE 1 TABLET BY MOUTH EVERY DAY AS NEEDED FOR ALLERGIES  Cholecalciferol 4000 UNITS TABS Take 1 tablet by mouth daily.  Coenzyme Q10 (COQ10 PO) Take 2 tablets by mouth daily.  Cranberry 250 MG TABS Take 1 tablet by mouth daily.  enoxaparin (LOVENOX) 120 MG/0.8ML injection Inject 0.8 mLs (120 mg total) into the skin daily.  fluconazole (DIFLUCAN) 150 MG tablet 1 tab po weekly prn rash  montelukast (SINGULAIR) 10 MG tablet Take 1 tablet (10 mg total) by mouth at bedtime.  NIACIN PO Take 1 tablet by mouth 3 (three) times daily.   NON FORMULARY AHCC immune support  Red Yeast Rice Extract (RED YEAST RICE PO) Take 1,200 mg by mouth 2 (two) times daily at 10 AM and 5 PM.  tacrolimus (PROTOPIC) 0.1 % ointment Apply  topically 2 (two) times daily.  thyroid (ARMOUR THYROID) 60 MG tablet 60 mg tab 1 tab po M, W, Th, F, Sun  thyroid [facility] THYROID) 90 MG tablet 90 mg  Tab Tuesday and Saturday. 60 mg tab M, W, Th, F, Sun  TURMERIC PO Take 1,200 mg by mouth daily.  warfarin (COUMADIN) 4 MG tablet TAKE 1 TABLET BY MOUTH EVERY DAY   Past Medical History:  Diagnosis Date  . Acid reflux 02/03/2016  . Allergy    seasonal  . Anxiety 12/01/2016  . Chronic anticoagulation 06/14/2013  . Coagulopathy ([place]) 05/16/2011  . Collagen vascular disease ([place])   . Cough 12/01/2016  . DVT (deep venous thrombosis) ([name])    takes coumadin  . Dysuria 02/27/2013  . Erectile dysfunction 12/28/2016  . 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 Hyperglycemia 12/26/2014  . Hyperlipidemia   . Hypothyroid 06/10/2011  . Insomnia 04/24/2016  . Left hip pain 12/17/2010  . MCI (mild cognitive impairment) 07/22/2016  . Nasal lesion 07/22/2016  . Polymyalgia ([place]) 03/21/2014  . 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 Preventative health care 12/17/2010  . Shingles 1982   right abdominal wall  . Staph skin infection 07/22/2016  . Thyroid nodule 11/29/2014  . Urinary hesitancy 09/30/2016  . Vitamin D deficiency 10/14/2011   Social History   Socioeconomic History  . Marital status: Married    Spouse name: Not on file  . Number  of children: Not on file  . Years of education: Not on file  . Highest education level: Not on file  Occupational History  . Not on file  Social Needs  . Financial resource strain: Not on file  . Food insecurity:    Worry: Not on file    Inability: Not on file  . Transportation needs:    Medical: Not on file    Non-medical: Not on file  Tobacco Use  . Smoking status: Former Smoker    Packs/day: 1.00    Years: 20.00    Pack years: 20.00    Types: Cigarettes    Last attempt to quit: 03/03/1980    Years since quitting: 37.9  . Smokeless tobacco: Never Used  Substance and Sexual Activity   . Alcohol use: No    Alcohol/week: 0.0 standard drinks  . Drug use: No  . Sexual activity: Not on file  Lifestyle  . Physical activity:    Days per week: Not on file    Minutes per session: Not on file  . Stress: Not on file  Relationships  . Social connections:    Talks on phone: Not on file    Gets together: Not on file    Attends religious service: Not on file    Active member of club or organization: Not on file    Attends meetings of clubs or organizations: Not on file    Relationship status: Not on file  Other Topics Concern  . Not on file  Social History Narrative  . Not on file   Family History  Problem Relation Age of Onset  . Cancer Mother        unknown- everywhere  . Cancer Sister        breast   ASSESSMENT Recent Results: Lab Results  Component Value Date   INR 2.7 02/10/2018   INR 3.3 (A) 01/28/2018   INR 3.6 (A) 01/18/2018   PROTIME 15.6 (H) 05/17/2013   Anticoagulation Dosing: Description   Take one (1) tablet of your 4 mg blue warfarin tablet by mouth daily, 1.5 tablets (6 mg) on Tuesdays and Fridays     INR today: Therapeutic  PLAN Weekly dose was unchanged. Patient was educated about warfarin management while in [place].  There are no Patient Instructions on file for this visit. Patient advised to contact clinic or seek medical attention if signs/symptoms of bleeding or thromboembolism occur.  Patient verbalized understanding by repeating back information and was advised to contact me if further medication-related questions arise. Patient was also provided an information handout.  Follow-up 1-2 weeks  [name]

## 2018-02-09 NOTE — Progress Notes (Signed)
Reviewed Thanks DrG 

## 2018-02-09 NOTE — Telephone Encounter (Signed)
INR from 02/03/2018 at 1000:                                   3.2 Sending to dr kim and placing paper copy on her desk

## 2018-02-09 NOTE — Telephone Encounter (Signed)
Thank you, Bonnita Nasuti! I took care of this one as well---he re-checked INR today due to trip to [place] tomorrow (INR 2.7). I have entered a separate anticoag note.  Much appreciated,  Delsa Sale

## 2018-02-10 ENCOUNTER — Telehealth: Payer: Self-pay | Admitting: *Deleted

## 2018-02-10 LAB — POCT INR: INR: 2.7 (ref 2.0–3.0)

## 2018-02-10 NOTE — Telephone Encounter (Signed)
INR 12/11 at 1000                                   2.7

## 2018-02-11 LAB — PROTIME-INR

## 2018-02-15 NOTE — Telephone Encounter (Signed)
Please see Dr. Julianne Rice documentation started 02/09/2018. She is aware of result. Hubbard Hartshorn, RN, BSN

## 2018-02-16 LAB — POCT INR: INR: 3 (ref 2.0–3.0)

## 2018-02-17 ENCOUNTER — Telehealth (INDEPENDENT_AMBULATORY_CARE_PROVIDER_SITE_OTHER): Payer: Medicare Other | Admitting: *Deleted

## 2018-02-17 DIAGNOSIS — D689 Coagulation defect, unspecified: Secondary | ICD-10-CM

## 2018-02-17 DIAGNOSIS — I82599 Chronic embolism and thrombosis of other specified deep vein of unspecified lower extremity: Secondary | ICD-10-CM

## 2018-02-17 DIAGNOSIS — I2699 Other pulmonary embolism without acute cor pulmonale: Secondary | ICD-10-CM

## 2018-02-17 DIAGNOSIS — D6859 Other primary thrombophilia: Secondary | ICD-10-CM

## 2018-02-17 DIAGNOSIS — Z5181 Encounter for therapeutic drug level monitoring: Secondary | ICD-10-CM

## 2018-02-17 DIAGNOSIS — Z7901 Long term (current) use of anticoagulants: Secondary | ICD-10-CM | POA: Diagnosis not present

## 2018-02-17 NOTE — Telephone Encounter (Addendum)
Received fax from [facility] with patient's PT/INR Self-Testing Summary Report from 12/24/2017-02/17/2018. Placed in Dr. Synthia Innocent box as Dr. Maudie Mercury unavailable today. Hubbard Hartshorn, RN, BSN

## 2018-02-18 NOTE — Telephone Encounter (Signed)
Anticoagulation Management Paul Ryan a 72 y.o.malewho reports to the clinic for monitoring of warfarintreatment.   Indication:Chronic anticoagulation [Z79.01], Coagulopathy ([place]) [[name]], DVT ([place]) [182.409], Pulmonary Embolism ([place]) [I126.99], Hereditary coagulopathy. Duration:indefinite Supervising physician:James Medina  Anticoagulation Clinic Visit History: [name] notreport signs/symptoms of bleeding or thromboembolism at this time. Other recent changes:patient is currently in [place]  No Known Allergies Medication Sig  b complex vitamins tablet Take 1 tablet by mouth daily.    cetirizine (ZYRTEC) 10 MG tablet TAKE 1 TABLET BY MOUTH EVERY DAY AS NEEDED FOR ALLERGIES  Cholecalciferol 4000 UNITS TABS Take 1 tablet by mouth daily.  Coenzyme Q10 (COQ10 PO) Take 2 tablets by mouth daily.  Cranberry 250 MG TABS Take 1 tablet by mouth daily.  enoxaparin (LOVENOX) 120 MG/0.8ML injection Inject 0.8 mLs (120 mg total) into the skin daily.  fluconazole (DIFLUCAN) 150 MG tablet 1 tab po weekly prn rash  montelukast (SINGULAIR) 10 MG tablet Take 1 tablet (10 mg total) by mouth at bedtime.  NIACIN PO Take 1 tablet by mouth 3 (three) times daily.   NON FORMULARY AHCC immune support  Red Yeast Rice Extract (RED YEAST RICE PO) Take 1,200 mg by mouth 2 (two) times daily at 10 AM and 5 PM.  tacrolimus (PROTOPIC) 0.1 % ointment Apply topically 2 (two) times daily.  thyroid (ARMOUR THYROID) 60 MG tablet 60 mg tab 1 tab po M, W, Th, F, Sun  thyroid [facility] THYROID) 90 MG tablet 90 mg  Tab Tuesday and Saturday. 60 mg tab M, W, Th, F, Sun  TURMERIC PO Take 1,200 mg by mouth daily.  warfarin (COUMADIN) 4 MG tablet TAKE 1 TABLET BY MOUTH EVERY DAY   Past Medical History:  Diagnosis Date  . Acid reflux 02/03/2016  . Allergy    seasonal  . Anxiety 12/01/2016  . Chronic anticoagulation 06/14/2013  . Coagulopathy ([place]) 05/16/2011  . Collagen vascular disease ([place])   . Cough 12/01/2016   . DVT (deep venous thrombosis) ([name])    takes coumadin  . Dysuria 02/27/2013  . Erectile dysfunction 12/28/2016  . 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 Hyperglycemia 12/26/2014  . Hyperlipidemia   . Hypothyroid 06/10/2011  . Insomnia 04/24/2016  . Left hip pain 12/17/2010  . MCI (mild cognitive impairment) 07/22/2016  . Nasal lesion 07/22/2016  . Polymyalgia ([place]) 03/21/2014  . 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 Preventative health care 12/17/2010  . Shingles 1982   right abdominal wall  . Staph skin infection 07/22/2016  . Thyroid nodule 11/29/2014  . Urinary hesitancy 09/30/2016  . Vitamin D deficiency 10/14/2011   Social History   Socioeconomic History  . Marital status: Married    Spouse name: Not on file  . Number of children: Not on file  . Years of education: Not on file  . Highest education level: Not on file  Occupational History  . Not on file  Social Needs  . Financial resource strain: Not on file  . Food insecurity:    Worry: Not on file    Inability: Not on file  . Transportation needs:    Medical: Not on file    Non-medical: Not on file  Tobacco Use  . Smoking status: Former Smoker    Packs/day: 1.00    Years: 20.00    Pack years: 20.00    Types: Cigarettes    Last attempt to quit: 03/03/1980    Years since quitting: 37.9  . Smokeless tobacco: Never Used  Substance and  Sexual Activity  . Alcohol use: No    Alcohol/week: 0.0 standard drinks  . Drug use: No  . Sexual activity: Not on file  Lifestyle  . Physical activity:    Days per week: Not on file    Minutes per session: Not on file  . Stress: Not on file  Relationships  . Social connections:    Talks on phone: Not on file    Gets together: Not on file    Attends religious service: Not on file    Active member of club or organization: Not on file    Attends meetings of clubs or organizations: Not on file    Relationship status: Not on file  Other Topics Concern  . Not  on file  Social History Narrative  . Not on file   Family History  Problem Relation Age of Onset  . Cancer Mother        unknown- everywhere  . Cancer Sister        breast   ASSESSMENT Recent Results: Lab Results  Component Value Date   INR 3.0 02/17/2018   INR 2.7 02/10/2018   INR 3.3 (A) 01/28/2018   PROTIME 15.6 (H) 05/17/2013   Anticoagulation Dosing: Description   Take one (1) tablet of your 4 mg blue warfarin tablet by mouth daily, 1.5 tablets (6 mg) on Tuesdays and Fridays     INR today: Therapeutic  PLAN Weekly dose was unchanged  There are no Patient Instructions on file for this visit. Patient advised to contact clinic or seek medical attention if signs/symptoms of bleeding or thromboembolism occur.  Patient verbalized understanding by repeating back information and was advised to contact me if further medication-related questions arise. Patient was also provided an information handout.  Follow-up 2 weeks  [name]

## 2018-02-18 NOTE — Telephone Encounter (Signed)
Reviewed & agree thanks [facility]

## 2018-02-22 ENCOUNTER — Other Ambulatory Visit: Payer: Self-pay | Admitting: Family Medicine

## 2018-03-01 ENCOUNTER — Telehealth: Payer: Self-pay

## 2018-03-01 ENCOUNTER — Ambulatory Visit (HOSPITAL_COMMUNITY)
Admission: RE | Admit: 2018-03-01 | Discharge: 2018-03-01 | Disposition: A | Discharge status: Home / Self Care | Payer: Medicare Other | Source: Ambulatory Visit | Attending: Family Medicine | Admitting: Family Medicine

## 2018-03-01 DIAGNOSIS — G629 Polyneuropathy, unspecified: Secondary | ICD-10-CM | POA: Insufficient documentation

## 2018-03-01 DIAGNOSIS — R4189 Other symptoms and signs involving cognitive functions and awareness: Secondary | ICD-10-CM | POA: Diagnosis not present

## 2018-03-01 DIAGNOSIS — D6859 Other primary thrombophilia: Secondary | ICD-10-CM | POA: Insufficient documentation

## 2018-03-01 DIAGNOSIS — G459 Transient cerebral ischemic attack, unspecified: Secondary | ICD-10-CM | POA: Diagnosis present

## 2018-03-01 DIAGNOSIS — E785 Hyperlipidemia, unspecified: Secondary | ICD-10-CM | POA: Diagnosis not present

## 2018-03-01 DIAGNOSIS — I340 Nonrheumatic mitral (valve) insufficiency: Secondary | ICD-10-CM | POA: Insufficient documentation

## 2018-03-01 NOTE — Telephone Encounter (Signed)
Copied from [place] [id number]. Topic: General - Other >> Feb 26, 2018  1:15 PM Yvette Rack wrote: Reason for CRM: Mickel Baas with [facility] Neurologist stated if patient had HBG A1C and Serum Electrophoresis labs done they would need the labs faxed to [phone]. Cb# [phone] [phone]   Faxed lab work over

## 2018-03-01 NOTE — Progress Notes (Signed)
  Echocardiogram 2D Echocardiogram has been performed.  Paul Ryan 03/01/2018, 10:36 AM

## 2018-03-01 NOTE — Telephone Encounter (Signed)
Called pt to review results of patient self test POC INR. INR was therapeutic at 2.6 on 32mg /week. Instructed pt to continue same regimen of one tablet every Sun, Mon, Wed, Thu, and Sat and 1 1/2 tabs on Tue, and Fri. Pt verbalized understanding and does not report any bleeding or clotting. Pt for f/u sample in one week.   [name], PharmD PGY1 Pharmacy Resident Phone [phone] 03/01/2018 9:47 AM

## 2018-03-05 ENCOUNTER — Telehealth (INDEPENDENT_AMBULATORY_CARE_PROVIDER_SITE_OTHER): Payer: Medicare Other | Admitting: *Deleted

## 2018-03-05 DIAGNOSIS — D689 Coagulation defect, unspecified: Secondary | ICD-10-CM

## 2018-03-05 DIAGNOSIS — Z86718 Personal history of other venous thrombosis and embolism: Secondary | ICD-10-CM

## 2018-03-05 DIAGNOSIS — I82599 Chronic embolism and thrombosis of other specified deep vein of unspecified lower extremity: Secondary | ICD-10-CM

## 2018-03-05 DIAGNOSIS — D682 Hereditary deficiency of other clotting factors: Secondary | ICD-10-CM

## 2018-03-05 DIAGNOSIS — Z7901 Long term (current) use of anticoagulants: Secondary | ICD-10-CM

## 2018-03-05 DIAGNOSIS — Z86711 Personal history of pulmonary embolism: Secondary | ICD-10-CM

## 2018-03-05 DIAGNOSIS — I2699 Other pulmonary embolism without acute cor pulmonale: Secondary | ICD-10-CM

## 2018-03-05 DIAGNOSIS — Z5181 Encounter for therapeutic drug level monitoring: Secondary | ICD-10-CM

## 2018-03-05 DIAGNOSIS — D6859 Other primary thrombophilia: Secondary | ICD-10-CM

## 2018-03-05 LAB — POCT INR: INR: 3.9 — AB (ref 2.0–3.0)

## 2018-03-05 NOTE — Telephone Encounter (Signed)
Reviewed thx DrG 

## 2018-03-05 NOTE — Telephone Encounter (Signed)
Received fax from [facility] - summary report from 01/18/18 to 03/05/18. INR 03/05/18 is 3.9. Dr Maudie Mercury is not here; will send to [name].

## 2018-03-05 NOTE — Progress Notes (Signed)
Anticoagulation Management Paul Ryan a 73 y.o.malewho reports to the clinic for monitoring of warfarintreatment.   Indication:Chronic anticoagulation [Z79.01], Coagulopathy ([place]) [[name]], DVT ([place]) [182.409], Pulmonary Embolism ([place]) [I126.99], Hereditary coagulopathy. Duration:indefinite Supervising physician:James [facility]  Anticoagulation Clinic Visit History: Patient is currently in [place], INR may be influenced by dietary changes.  Anticoagulation Episode Summary    Current INR goal:   2.0-3.0  TTR:   64.5 % (3.5 y)  Next INR check:   03/12/2018  INR from last check:   3.9! 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    cetirizine (ZYRTEC) 10 MG tablet TAKE 1 TABLET BY MOUTH EVERY DAY AS NEEDED FOR ALLERGIES  Cholecalciferol 4000 UNITS TABS Take 1 tablet by mouth daily.  Coenzyme Q10 (COQ10 PO) Take 2 tablets by mouth daily.  Cranberry 250 MG TABS Take 1 tablet by mouth daily.  enoxaparin (LOVENOX) 120 MG/0.8ML injection Inject 0.8 mLs (120 mg total) into the skin daily.  fluconazole (DIFLUCAN) 150 MG tablet 1 tab po weekly prn rash  montelukast (SINGULAIR) 10 MG tablet Take 1 tablet (10 mg total) by mouth at bedtime.  NIACIN PO Take 1 tablet by mouth 3 (three) times daily.   NON FORMULARY AHCC immune support  Red Yeast Rice Extract (RED YEAST RICE PO) Take 1,200 mg by mouth 2 (two) times daily at 10 AM and 5 PM.  tacrolimus (PROTOPIC) 0.1 % ointment Apply topically 2 (two) times daily.  thyroid (ARMOUR THYROID) 60 MG tablet 60 mg tab 1 tab  po M, W, Th, F, Sun  thyroid (ARMOUR THYROID) 90 MG tablet TAKE ONE TABLET BY MOUTH ON MONDAYS, WEDNESDAYS, THURSDAYS,FRIDAYS AND SUNDAYS  TURMERIC PO Take 1,200 mg by mouth daily.  warfarin (COUMADIN) 4 MG tablet TAKE 1 TABLET BY MOUTH EVERY DAY   Past Medical History:  Diagnosis Date  . Acid reflux 02/03/2016  . Allergy    seasonal  . Anxiety 12/01/2016  . Chronic anticoagulation 06/14/2013  . Coagulopathy ([place]) 05/16/2011  . Collagen vascular disease ([place])   . Cough 12/01/2016  . DVT (deep venous thrombosis) ([name])    takes coumadin  . Dysuria 02/27/2013  . Erectile dysfunction 12/28/2016  . 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 Hyperglycemia 12/26/2014  . Hyperlipidemia   . Hypothyroid 06/10/2011  . Insomnia 04/24/2016  . Left hip pain 12/17/2010  . MCI (mild cognitive impairment) 07/22/2016  . Nasal lesion 07/22/2016  . Polymyalgia ([place]) 03/21/2014  . 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 Preventative health care 12/17/2010  . Shingles 1982   right abdominal wall  . Staph skin infection 07/22/2016  . Thyroid nodule 11/29/2014  . Urinary hesitancy 09/30/2016  . Vitamin D deficiency 10/14/2011   Social History   Socioeconomic History  . Marital status: Married    Spouse name: Not on file  . Number of children: Not on file  . Years of education: Not on file  . Highest education level: Not on file  Occupational  History  . Not on file  Social Needs  . Financial resource strain: Not on file  . Food insecurity:    Worry: Not on file    Inability: Not on file  . Transportation needs:    Medical: Not on file    Non-medical: Not on file  Tobacco Use  . Smoking status: Former Smoker    Packs/day: 1.00    Years: 20.00    Pack years: 20.00    Types: Cigarettes    Last attempt to quit: 03/03/1980    Years since quitting: 38.0  . Smokeless tobacco: Never Used  Substance and Sexual Activity  . Alcohol use: No    Alcohol/week: 0.0 standard drinks  . Drug  use: No  . Sexual activity: Not on file  Lifestyle  . Physical activity:    Days per week: Not on file    Minutes per session: Not on file  . Stress: Not on file  Relationships  . Social connections:    Talks on phone: Not on file    Gets together: Not on file    Attends religious service: Not on file    Active member of club or organization: Not on file    Attends meetings of clubs or organizations: Not on file    Relationship status: Not on file  Other Topics Concern  . Not on file  Social History Narrative  . Not on file   Family History  Problem Relation Age of Onset  . Cancer Mother        unknown- everywhere  . Cancer Sister        breast   ASSESSMENT Recent Results: Lab Results  Component Value Date   INR 3.9 (A) 03/05/2018   INR 3.0 02/17/2018   INR 2.7 02/10/2018   PROTIME 15.6 (H) 05/17/2013    Anticoagulation Dosing: Description   Take one (1) tablet of your 4 mg blue warfarin tablet by mouth daily, 1.5 tablets (6 mg) on Tuesdays and Fridays     INR today: Supratherapeutic  PLAN Weekly dose was decreased by 6% to 30 mg per week  There are no Patient Instructions on file for this visit. Patient advised to contact clinic or seek medical attention if signs/symptoms of bleeding or thromboembolism occur.  Patient verbalized understanding by repeating back information and was advised to contact me if further medication-related questions arise. Patient was also provided an information handout.  Follow-up 1 week  [name]

## 2018-03-05 NOTE — Telephone Encounter (Signed)
Reviewed Agree w disposition per pharmacist Thanks DrG

## 2018-03-09 ENCOUNTER — Encounter: Payer: Self-pay | Admitting: Pharmacist

## 2018-03-09 DIAGNOSIS — I2699 Other pulmonary embolism without acute cor pulmonale: Secondary | ICD-10-CM

## 2018-03-09 DIAGNOSIS — D6859 Other primary thrombophilia: Secondary | ICD-10-CM

## 2018-03-09 DIAGNOSIS — D689 Coagulation defect, unspecified: Secondary | ICD-10-CM

## 2018-03-09 DIAGNOSIS — I82599 Chronic embolism and thrombosis of other specified deep vein of unspecified lower extremity: Secondary | ICD-10-CM

## 2018-03-09 DIAGNOSIS — Z7901 Long term (current) use of anticoagulants: Secondary | ICD-10-CM

## 2018-03-09 LAB — POCT INR: INR: 2.8 (ref 2.0–3.0)

## 2018-03-10 LAB — PROTIME-INR

## 2018-03-10 NOTE — Progress Notes (Signed)
Anticoagulation Management Paul Ryan a 73 y.o.malewho reports to the clinic for monitoring of warfarintreatment.   Indication:Chronic anticoagulation [Z79.01], Coagulopathy ([place]) [[name]], DVT ([place]) [182.409], Pulmonary Embolism ([place]) [I126.99], Hereditary coagulopathy. Duration:indefinite Supervising physician:James [place]  Anticoagulation Clinic Visit History: [name] notreport signs/symptoms of bleeding or thromboembolism at this time.  Anticoagulation Episode Summary    Current INR goal:   2.0-3.0  TTR:   64.3 % (3.6 y)  Next INR check:   03/12/2018  INR from last check:   3.9! 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    cetirizine (ZYRTEC) 10 MG tablet TAKE 1 TABLET BY MOUTH EVERY DAY AS NEEDED FOR ALLERGIES  Cholecalciferol 4000 UNITS TABS Take 1 tablet by mouth daily.  Coenzyme Q10 (COQ10 PO) Take 2 tablets by mouth daily.  Cranberry 250 MG TABS Take 1 tablet by mouth daily.  enoxaparin (LOVENOX) 120 MG/0.8ML injection Inject 0.8 mLs (120 mg total) into the skin daily.  fluconazole (DIFLUCAN) 150 MG tablet 1 tab po weekly prn rash  montelukast (SINGULAIR) 10 MG tablet Take 1 tablet (10 mg total) by mouth at bedtime.  NIACIN PO Take 1 tablet by mouth 3 (three) times daily.   NON FORMULARY AHCC immune support  Red Yeast Rice Extract (RED YEAST RICE PO) Take 1,200 mg by mouth 2 (two) times daily at 10 AM and 5 PM.  tacrolimus (PROTOPIC) 0.1 % ointment Apply topically 2 (two) times daily.   thyroid (ARMOUR THYROID) 60 MG tablet 60 mg tab 1 tab po M, W, Th, F, Sun  thyroid (ARMOUR THYROID) 90 MG tablet TAKE ONE TABLET BY MOUTH ON MONDAYS, WEDNESDAYS, THURSDAYS,FRIDAYS AND SUNDAYS  TURMERIC PO Take 1,200 mg by mouth daily.  warfarin (COUMADIN) 4 MG tablet TAKE 1 TABLET BY MOUTH EVERY DAY   Past Medical History:  Diagnosis Date  . Acid reflux 02/03/2016  . Allergy    seasonal  . Anxiety 12/01/2016  . Chronic anticoagulation 06/14/2013  . Coagulopathy ([place]) 05/16/2011  . Collagen vascular disease ([place])   . Cough 12/01/2016  . DVT (deep venous thrombosis) ([name])    takes coumadin  . Dysuria 02/27/2013  . Erectile dysfunction 12/28/2016  . 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 Hyperglycemia 12/26/2014  . Hyperlipidemia   . Hypothyroid 06/10/2011  . Insomnia 04/24/2016  . Left hip pain 12/17/2010  . MCI (mild cognitive impairment) 07/22/2016  . Nasal lesion 07/22/2016  . Polymyalgia ([place]) 03/21/2014  . 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 Preventative health care 12/17/2010  . Shingles 1982   right abdominal wall  . Staph skin infection 07/22/2016  . Thyroid nodule 11/29/2014  . Urinary hesitancy 09/30/2016  . Vitamin D deficiency 10/14/2011   Social History   Socioeconomic History  . Marital status: Married    Spouse name: Not on file  . Number of children: Not on file  . Years of education: Not on file  . Highest  education level: Not on file  Occupational History  . Not on file  Social Needs  . Financial resource strain: Not on file  . Food insecurity:    Worry: Not on file    Inability: Not on file  . Transportation needs:    Medical: Not on file    Non-medical: Not on file  Tobacco Use  . Smoking status: Former Smoker    Packs/day: 1.00    Years: 20.00    Pack years: 20.00    Types: Cigarettes    Last attempt to quit: 03/03/1980    Years since quitting: 38.0  . Smokeless tobacco: Never Used  Substance and Sexual Activity  . Alcohol  use: No    Alcohol/week: 0.0 standard drinks  . Drug use: No  . Sexual activity: Not on file  Lifestyle  . Physical activity:    Days per week: Not on file    Minutes per session: Not on file  . Stress: Not on file  Relationships  . Social connections:    Talks on phone: Not on file    Gets together: Not on file    Attends religious service: Not on file    Active member of club or organization: Not on file    Attends meetings of clubs or organizations: Not on file    Relationship status: Not on file  Other Topics Concern  . Not on file  Social History Narrative  . Not on file   Family History  Problem Relation Age of Onset  . Cancer Mother        unknown- everywhere  . Cancer Sister        breast   ASSESSMENT Lab Results  Component Value Date   INR 2.8 03/09/2018   INR 3.9 (A) 03/05/2018   INR 3.0 02/16/2018   PROTIME 15.6 (H) 05/17/2013   Anticoagulation Dosing: Description   Take one (1) tablet of your 4 mg blue warfarin tablet by mouth daily, 1.5 tablets (6 mg) on Tuesdays     INR today: Therapeutic  PLAN Weekly dose was unchanged   There are no Patient Instructions on file for this visit. Patient advised to contact clinic or seek medical attention if signs/symptoms of bleeding or thromboembolism occur.  Patient verbalized understanding by repeating back information and was advised to contact me if further medication-related questions arise. Patient was also provided an information handout.  Follow-up 1 week  [name]

## 2018-03-10 NOTE — Progress Notes (Signed)
Reviewed & I concur. Let's leave him at current dose thx DrG

## 2018-03-11 LAB — PROTIME-INR

## 2018-03-24 ENCOUNTER — Encounter: Payer: Self-pay | Admitting: Pharmacist

## 2018-03-24 DIAGNOSIS — D6859 Other primary thrombophilia: Secondary | ICD-10-CM

## 2018-03-24 DIAGNOSIS — Z7901 Long term (current) use of anticoagulants: Secondary | ICD-10-CM

## 2018-03-24 DIAGNOSIS — D689 Coagulation defect, unspecified: Secondary | ICD-10-CM

## 2018-03-24 DIAGNOSIS — I82599 Chronic embolism and thrombosis of other specified deep vein of unspecified lower extremity: Secondary | ICD-10-CM

## 2018-03-24 DIAGNOSIS — I2782 Chronic pulmonary embolism: Secondary | ICD-10-CM

## 2018-03-24 LAB — POCT INR: INR: 2.8 (ref 2.0–3.0)

## 2018-03-25 ENCOUNTER — Encounter: Payer: Medicare Other | Admitting: Psychology

## 2018-03-26 NOTE — Progress Notes (Signed)
Anticoagulation Management Paul Ryan a 73 y.o.malewho reports to the clinic for monitoring of warfarintreatment.   Indication:Chronic anticoagulation [Z79.01], Coagulopathy ([place]) [[name]], DVT ([place]) [182.409], Pulmonary Embolism ([place]) [I126.99], Hereditary coagulopathy. Duration:indefinite Supervising physician:[name]  Anticoagulation Clinic Visit History: [name] notreport signs/symptoms of bleeding or thromboembolism at this time.  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    cetirizine (ZYRTEC) 10 MG tablet TAKE 1 TABLET BY MOUTH EVERY DAY AS NEEDED FOR ALLERGIES  Cholecalciferol 4000 UNITS TABS Take 1 tablet by mouth daily.  Coenzyme Q10 (COQ10 PO) Take 2 tablets by mouth daily.  Cranberry 250 MG TABS Take 1 tablet by mouth daily.  enoxaparin (LOVENOX) 120 MG/0.8ML injection Inject 0.8 mLs (120 mg total) into the skin daily.  fluconazole (DIFLUCAN) 150 MG tablet 1 tab po weekly prn rash  montelukast (SINGULAIR) 10 MG tablet Take 1 tablet (10 mg total) by mouth at bedtime.  NIACIN PO Take 1 tablet by mouth 3 (three) times daily.   NON FORMULARY AHCC immune support  Red Yeast Rice Extract (RED YEAST RICE PO) Take 1,200 mg by mouth 2 (two) times daily at 10 AM and 5 PM.  tacrolimus (PROTOPIC) 0.1 % ointment Apply topically 2 (two) times daily.  thyroid (ARMOUR THYROID) 60 MG tablet 60  mg tab 1 tab po M, W, Th, F, Sun  thyroid (ARMOUR THYROID) 90 MG tablet TAKE ONE TABLET BY MOUTH ON MONDAYS, WEDNESDAYS, THURSDAYS,FRIDAYS AND SUNDAYS  TURMERIC PO Take 1,200 mg by mouth daily.  warfarin (COUMADIN) 4 MG tablet TAKE 1 TABLET BY MOUTH EVERY DAY   Past Medical History:  Diagnosis Date  . Acid reflux 02/03/2016  . Allergy    seasonal  . Anxiety 12/01/2016  . Chronic anticoagulation 06/14/2013  . Coagulopathy ([place]) 05/16/2011  . Collagen vascular disease ([place])   . Cough 12/01/2016  . DVT (deep venous thrombosis) ([name])    takes coumadin  . Dysuria 02/27/2013  . Erectile dysfunction 12/28/2016  . 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 Hyperglycemia 12/26/2014  . Hyperlipidemia   . Hypothyroid 06/10/2011  . Insomnia 04/24/2016  . Left hip pain 12/17/2010  . MCI (mild cognitive impairment) 07/22/2016  . Nasal lesion 07/22/2016  . Polymyalgia ([place]) 03/21/2014  . 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 Preventative health care 12/17/2010  . Shingles 1982   right abdominal wall  . Staph skin infection 07/22/2016  . Thyroid nodule 11/29/2014  . Urinary hesitancy 09/30/2016  . Vitamin D deficiency 10/14/2011   Social History   Socioeconomic History  . Marital status: Married    Spouse name: Not on file  . Number of children: Not on file  . Years of education: Not on file  . Highest education level: Not on file  Occupational History  .  Not on file  Social Needs  . Financial resource strain: Not on file  . Food insecurity:    Worry: Not on file    Inability: Not on file  . Transportation needs:    Medical: Not on file    Non-medical: Not on file  Tobacco Use  . Smoking status: Former Smoker    Packs/day: 1.00    Years: 20.00    Pack years: 20.00    Types: Cigarettes    Last attempt to quit: 03/03/1980    Years since quitting: 38.0  . Smokeless tobacco: Never Used  Substance and Sexual Activity  . Alcohol use: No    Alcohol/week: 0.0 standard drinks   . Drug use: No  . Sexual activity: Not on file  Lifestyle  . Physical activity:    Days per week: Not on file    Minutes per session: Not on file  . Stress: Not on file  Relationships  . Social connections:    Talks on phone: Not on file    Gets together: Not on file    Attends religious service: Not on file    Active member of club or organization: Not on file    Attends meetings of clubs or organizations: Not on file    Relationship status: Not on file  Other Topics Concern  . Not on file  Social History Narrative  . Not on file   Family History  Problem Relation Age of Onset  . Cancer Mother        unknown- everywhere  . Cancer Sister        breast   ASSESSMENT Lab Results  Component Value Date   INR 2.8 03/24/2018   INR 2.8 03/09/2018   INR 3.9 (A) 03/05/2018   PROTIME 15.6 (H) 05/17/2013   Anticoagulation Dosing: Description   Take one (1) tablet of your 4 mg blue warfarin tablet by mouth daily, 1.5 tablets (6 mg) on Tuesdays and Fridays     INR today: Therapeutic  PLAN Weekly dose was unchanged  There are no Patient Instructions on file for this visit. Patient advised to contact clinic or seek medical attention if signs/symptoms of bleeding or thromboembolism occur.  Patient verbalized understanding by repeating back information and was advised to contact me if further medication-related questions arise. Patient was also provided an information handout.  Follow-up 2 weeks  [name]

## 2018-03-29 NOTE — Progress Notes (Signed)
Reviewed Thanks DrG 

## 2018-04-02 ENCOUNTER — Encounter: Payer: Self-pay | Admitting: Family Medicine

## 2018-04-02 ENCOUNTER — Ambulatory Visit: Payer: Medicare Other | Admitting: Neurology

## 2018-04-05 ENCOUNTER — Other Ambulatory Visit: Payer: Self-pay | Admitting: Family Medicine

## 2018-04-05 MED ORDER — THYROID 90 MG PO TABS: ORAL_TABLET | ORAL | 1 refills | Status: DC

## 2018-04-12 ENCOUNTER — Other Ambulatory Visit: Payer: Self-pay | Admitting: Family Medicine

## 2018-04-12 MED ORDER — THYROID 60 MG PO TABS: ORAL_TABLET | ORAL | 1 refills | Status: DC

## 2018-04-12 MED ORDER — THYROID 90 MG PO TABS: ORAL_TABLET | ORAL | 1 refills | Status: DC

## 2018-04-20 ENCOUNTER — Encounter: Payer: Medicare Other | Admitting: Psychology

## 2018-04-28 LAB — POCT INR: INR: 3.2 — AB (ref 2.0–3.0)

## 2018-04-29 ENCOUNTER — Telehealth (INDEPENDENT_AMBULATORY_CARE_PROVIDER_SITE_OTHER): Payer: Medicare Other | Admitting: Pharmacist

## 2018-04-29 DIAGNOSIS — Z86711 Personal history of pulmonary embolism: Secondary | ICD-10-CM

## 2018-04-29 DIAGNOSIS — D689 Coagulation defect, unspecified: Secondary | ICD-10-CM | POA: Diagnosis not present

## 2018-04-29 DIAGNOSIS — I82599 Chronic embolism and thrombosis of other specified deep vein of unspecified lower extremity: Secondary | ICD-10-CM

## 2018-04-29 DIAGNOSIS — Z5181 Encounter for therapeutic drug level monitoring: Secondary | ICD-10-CM

## 2018-04-29 DIAGNOSIS — Z86718 Personal history of other venous thrombosis and embolism: Secondary | ICD-10-CM

## 2018-04-29 DIAGNOSIS — I2782 Chronic pulmonary embolism: Secondary | ICD-10-CM

## 2018-04-29 DIAGNOSIS — Z7901 Long term (current) use of anticoagulants: Secondary | ICD-10-CM

## 2018-04-29 DIAGNOSIS — D6859 Other primary thrombophilia: Secondary | ICD-10-CM

## 2018-04-29 NOTE — Progress Notes (Signed)
Anticoagulation Management Paul Ryan a 73 y.o.malewho reports to [facility] clinic for monitoring of warfarintreatment.   Indication:Chronic anticoagulation [Z79.01], Coagulopathy (Paul Ryan) [[name]], DVT (Paul Ryan) [182.409], Pulmonary Embolism (Paul Ryan) [I126.99], Hereditary coagulopathy. Duration:indefinite Supervising physician:Paul Ryan  Anticoagulation Clinic Visit History: [name] notreport signs/symptoms of bleeding or thromboembolism at this time.  Anticoagulation Episode Summary    Current INR goal:   2.0-3.0  TTR:   64.3 % (3.7 y)  Next INR check:   05/12/2018  INR from last check:   3.2! (04/28/2018)  Weekly max warfarin dose:     Target end date:     INR check location:     Preferred lab:     Send INR reminders to:      Indications   Chronic anticoagulation [Z79.01] Coagulopathy (Paul Ryan) [[name]] DVT (deep venous thrombosis) (Paul Ryan) [[name]] Pulmonary embolism (Paul Ryan) [[name]] Hereditary protein S deficiency (Paul Ryan) [D68.59]       Comments:   DR Paul Ryan       No Known Allergies Medication Sig  b complex vitamins tablet Take 1 tablet by mouth daily.    cetirizine (ZYRTEC) 10 MG tablet TAKE 1 TABLET BY MOUTH EVERY DAY AS NEEDED FOR ALLERGIES  Cholecalciferol 4000 UNITS TABS Take 1 tablet by mouth daily.  Coenzyme Q10 (COQ10 PO) Take 2 tablets by mouth daily.  Cranberry 250 MG TABS Take 1 tablet by mouth daily.  enoxaparin (LOVENOX) 120 MG/0.8ML injection Inject 0.8 mLs (120 mg total) into Paul skin daily.  fluconazole (DIFLUCAN) 150 MG tablet 1 tab po weekly prn rash  montelukast (SINGULAIR) 10 MG tablet Take 1 tablet (10 mg total) by mouth at bedtime.  NIACIN PO Take 1 tablet by mouth 3 (three) times daily.   NON FORMULARY AHCC immune support  Red Yeast Rice Extract (RED YEAST RICE PO) Take 1,200 mg by mouth 2 (two) times daily at 10 AM and 5 PM.  tacrolimus (PROTOPIC) 0.1 % ointment Apply topically 2 (two) times daily.  thyroid (ARMOUR THYROID) 60 MG tablet 60  mg tab 1 tab po M, W, Th, F, Sun  thyroid [facility] THYROID) 90 MG tablet TAKE ONE TABLET BY MOUTH ON Tuesday and Saturday  TURMERIC PO Take 1,200 mg by mouth daily.  warfarin (COUMADIN) 4 MG tablet TAKE 1 TABLET BY MOUTH EVERY DAY   Past Medical History:  Diagnosis Date  . Acid reflux 02/03/2016  . Allergy    seasonal  . Anxiety 12/01/2016  . Chronic anticoagulation 06/14/2013  . Coagulopathy (Paul Ryan) 05/16/2011  . Collagen vascular disease (Paul Ryan)   . Cough 12/01/2016  . DVT (deep venous thrombosis) (Paul Ryan)    takes coumadin  . Dysuria 02/27/2013  . Erectile dysfunction 12/28/2016  . Hereditary protein S deficiency (Paul Ryan) 05/16/2011  . Hyperglycemia 12/26/2014  . Hyperlipidemia   . Hypothyroid 06/10/2011  . Insomnia 04/24/2016  . Left hip pain 12/17/2010  . MCI (mild cognitive impairment) 07/22/2016  . Nasal lesion 07/22/2016  . Polymyalgia (Paul Ryan) 03/21/2014  . 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 Preventative health care 12/17/2010  . Shingles 1982   right abdominal wall  . Staph skin infection 07/22/2016  . Thyroid nodule 11/29/2014  . Urinary hesitancy 09/30/2016  . Vitamin D deficiency 10/14/2011   Social History   Socioeconomic History  . Marital status: Married    Spouse name: Not on file  . Number of children: Not on file  . Years of education: Not on file  . Highest education level: Not on file  Occupational History  . Not  on file  Social Needs  . Financial resource strain: Not on file  . Food insecurity:    Worry: Not on file    Inability: Not on file  . Transportation needs:    Medical: Not on file    Non-medical: Not on file  Tobacco Use  . Smoking status: Former Smoker    Packs/day: 1.00    Years: 20.00    Pack years: 20.00    Types: Cigarettes    Last attempt to quit: 03/03/1980    Years since quitting: 38.1  . Smokeless tobacco: Never Used  Substance and Sexual Activity  . Alcohol use: No    Alcohol/week: 0.0 standard drinks  . Drug use: No  . Sexual  activity: Not on file  Lifestyle  . Physical activity:    Days per week: Not on file    Minutes per session: Not on file  . Stress: Not on file  Relationships  . Social connections:    Talks on phone: Not on file    Gets together: Not on file    Attends religious service: Not on file    Active member of club or organization: Not on file    Attends meetings of clubs or organizations: Not on file    Relationship status: Not on file  Other Topics Concern  . Not on file  Social History Narrative  . Not on file   Family History  Problem Relation Age of Onset  . Cancer Mother        unknown- everywhere  . Cancer Sister        breast   ASSESSMENT Lab Results  Component Value Date   INR 3.2 (A) 04/28/2018   INR 2.8 03/24/2018   INR 2.8 03/09/2018   PROTIME 15.6 (H) 05/17/2013   INR today: slightly Supratherapeutic  PLAN Weekly dose was unchanged due to thromboembolic risk/history and no bleeding or bruising concerns, follow up within 2 weeks.  There are no Patient Instructions on file for this visit. Patient advised to contact clinic or seek medical attention if signs/symptoms of bleeding or thromboembolism occur.  Patient verbalized understanding by repeating back information and was advised to contact me if further medication-related questions arise. Patient was also provided an information handout.  Follow-up 2 weeks  [name]

## 2018-04-30 LAB — PROTIME-INR

## 2018-04-30 NOTE — Telephone Encounter (Signed)
Reviewed OK to let him run a little high since we have had difficulty keeping him in therapeutic range DrG

## 2018-05-18 ENCOUNTER — Encounter: Payer: Self-pay | Admitting: *Deleted

## 2018-05-19 ENCOUNTER — Encounter: Payer: Self-pay | Admitting: Oncology

## 2018-05-20 ENCOUNTER — Encounter: Payer: Self-pay | Admitting: Family Medicine

## 2018-05-20 ENCOUNTER — Telehealth (INDEPENDENT_AMBULATORY_CARE_PROVIDER_SITE_OTHER): Payer: Medicare Other | Admitting: Pharmacist

## 2018-05-20 DIAGNOSIS — I82509 Chronic embolism and thrombosis of unspecified deep veins of unspecified lower extremity: Secondary | ICD-10-CM

## 2018-05-20 DIAGNOSIS — I2782 Chronic pulmonary embolism: Secondary | ICD-10-CM

## 2018-05-20 DIAGNOSIS — D689 Coagulation defect, unspecified: Secondary | ICD-10-CM | POA: Diagnosis not present

## 2018-05-20 DIAGNOSIS — D6859 Other primary thrombophilia: Secondary | ICD-10-CM

## 2018-05-20 DIAGNOSIS — Z7901 Long term (current) use of anticoagulants: Secondary | ICD-10-CM | POA: Diagnosis not present

## 2018-05-20 DIAGNOSIS — I82599 Chronic embolism and thrombosis of other specified deep vein of unspecified lower extremity: Secondary | ICD-10-CM

## 2018-05-20 LAB — POCT INR: INR: 5.1 — AB (ref 2.0–3.0)

## 2018-05-20 NOTE — Telephone Encounter (Signed)
Reviewed.  Sorry we are right down to the wire and still having to make dose adjustments.

## 2018-05-20 NOTE — Progress Notes (Addendum)
Anticoagulation Management Paul Ryan a 73 y.o.malewho reports to the clinic for monitoring of warfarintreatment.   Indication:Chronic anticoagulation [Z79.01], Coagulopathy ([place]) [[name]], DVT ([place]) [182.409], Pulmonary Embolism ([place]) [I126.99], Hereditary coagulopathy. Duration:indefinite Supervising physician:James [place]  Anticoagulation Clinic Visit History: [name] notreport signs/symptoms of bleeding or thromboembolism at this time.  Anticoagulation Episode Summary    Current INR goal:   2.0-3.0  TTR:   63.3 % (3.7 y)  Next INR check:   05/12/2018  INR from last check:   5.1! 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    cetirizine (ZYRTEC) 10 MG tablet TAKE 1 TABLET BY MOUTH EVERY DAY AS NEEDED FOR ALLERGIES  Cholecalciferol 4000 UNITS TABS Take 1 tablet by mouth daily.  Coenzyme Q10 (COQ10 PO) Take 2 tablets by mouth daily.  Cranberry 250 MG TABS Take 1 tablet by mouth daily.  enoxaparin (LOVENOX) 120 MG/0.8ML injection Inject 0.8 mLs (120 mg total) into the skin daily.  fluconazole (DIFLUCAN) 150 MG tablet 1 tab po weekly prn rash  montelukast (SINGULAIR) 10 MG tablet Take 1 tablet (10 mg total) by mouth at bedtime.  NIACIN PO Take 1 tablet by mouth 3 (three) times daily.   NON FORMULARY AHCC immune support  Red Yeast Rice Extract (RED YEAST RICE PO) Take 1,200 mg by mouth 2 (two) times daily at 10 AM and 5 PM.  tacrolimus (PROTOPIC) 0.1 % ointment Apply topically 2 (two) times daily.  thyroid (ARMOUR THYROID) 60 MG tablet 60  mg tab 1 tab po M, W, Th, F, Sun  thyroid [facility] THYROID) 90 MG tablet TAKE ONE TABLET BY MOUTH ON Tuesday and Saturday  TURMERIC PO Take 1,200 mg by mouth daily.  warfarin (COUMADIN) 4 MG tablet TAKE 1 TABLET BY MOUTH EVERY DAY   Past Medical History:  Diagnosis Date  . Acid reflux 02/03/2016  . Allergy    seasonal  . Anxiety 12/01/2016  . Chronic anticoagulation 06/14/2013  . Coagulopathy ([place]) 05/16/2011  . Collagen vascular disease ([place])   . Cough 12/01/2016  . DVT (deep venous thrombosis) ([name])    takes coumadin  . Dysuria 02/27/2013  . Erectile dysfunction 12/28/2016  . 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 Hyperglycemia 12/26/2014  . Hyperlipidemia   . Hypothyroid 06/10/2011  . Insomnia 04/24/2016  . Left hip pain 12/17/2010  . MCI (mild cognitive impairment) 07/22/2016  . Nasal lesion 07/22/2016  . Polymyalgia ([place]) 03/21/2014  . 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 Preventative health care 12/17/2010  . Shingles 1982   right abdominal wall  . Staph skin infection 07/22/2016  . Thyroid nodule 11/29/2014  . Urinary hesitancy 09/30/2016  . Vitamin D deficiency 10/14/2011   Social History   Socioeconomic History  . Marital status: Married    Spouse name: Not on file  . Number of children: Not on file  . Years of education: Not on file  . Highest education level: Not on file  Occupational History  . Not  on file  Social Needs  . Financial resource strain: Not on file  . Food insecurity:    Worry: Not on file    Inability: Not on file  . Transportation needs:    Medical: Not on file    Non-medical: Not on file  Tobacco Use  . Smoking status: Former Smoker    Packs/day: 1.00    Years: 20.00    Pack years: 20.00    Types: Cigarettes    Last attempt to quit: 03/03/1980    Years since quitting: 38.2  . Smokeless tobacco: Never Used  Substance and Sexual Activity  . Alcohol use: No    Alcohol/week: 0.0 standard drinks  . Drug use: No  . Sexual  activity: Not on file  Lifestyle  . Physical activity:    Days per week: Not on file    Minutes per session: Not on file  . Stress: Not on file  Relationships  . Social connections:    Talks on phone: Not on file    Gets together: Not on file    Attends religious service: Not on file    Active member of club or organization: Not on file    Attends meetings of clubs or organizations: Not on file    Relationship status: Not on file  Other Topics Concern  . Not on file  Social History Narrative  . Not on file   Family History  Problem Relation Age of Onset  . Cancer Mother        unknown- everywhere  . Cancer Sister        breast   ASSESSMENT Lab Results  Component Value Date   INR 5.1 (A) 05/20/2018   INR 3.2 (A) 04/28/2018   INR 2.8 03/24/2018   PROTIME 15.6 (H) 05/17/2013   Anticoagulation Dosing: Description   HOLD today's dose, take 1/2 tablet tomorrow, then 1 tablet daily     INR today: Supratherapeutic  PLAN Hold x 1 dose, and decrease weekly dose, recheck next week  There are no Patient Instructions on file for this visit. Patient advised to contact clinic or seek medical attention if signs/symptoms of bleeding or thromboembolism occur.  Patient verbalized understanding by repeating back information and was advised to contact me if further medication-related questions arise. Patient was also provided an information handout.  Patient is transitioning from Dr. Beryle Beams to new provider. He states he is working on final follow up with Dr. Beryle Beams within the next few weeks, and will thereafter have INR managed elsewhere.  [name]

## 2018-05-21 ENCOUNTER — Telehealth: Payer: Self-pay | Admitting: Oncology

## 2018-05-21 MED ORDER — ENOXAPARIN SODIUM 120 MG/0.8ML ~~LOC~~ SOLN: 120.0000 mg | SUBCUTANEOUS | 3 refills | Status: DC

## 2018-05-21 MED ORDER — WARFARIN SODIUM 4 MG PO TABS: 4.0000 mg | ORAL_TABLET | Freq: Every day | ORAL | 3 refills | Status: DC

## 2018-05-21 NOTE — Addendum Note (Signed)
Addended by: Forde Dandy on: 05/21/2018 11:57 AM   Modules accepted: Orders

## 2018-05-21 NOTE — Telephone Encounter (Signed)
A new hem appt has been scheduled for the pt to see Dr. Benay Spice on 5/14 at 12pm. Pt was driving and asked that I put the appt in for him to see through [place].

## 2018-05-27 ENCOUNTER — Other Ambulatory Visit: Payer: Self-pay | Admitting: Family Medicine

## 2018-05-28 ENCOUNTER — Telehealth: Payer: Self-pay | Admitting: Family Medicine

## 2018-05-28 NOTE — Telephone Encounter (Signed)
Yes he is OK the doses alternate

## 2018-05-28 NOTE — Telephone Encounter (Signed)
Copied from [place] [id number]. Topic: Quick Communication - Rx Refill/Question >> May 28, 2018  9:12 AM Reyne Dumas L wrote: Medication:  thyroid (ARMOUR THYROID) 60 MG tablet  thyroid (ARMOUR THYROID) 90 MG tablet  Jessica from [facility] called and left voicemail on [facility] mailbox line.  States she has questions about these medications for pt.  Janett Billow can be reached at [phone]

## 2018-05-28 NOTE — Telephone Encounter (Signed)
Please advise if Pt is supposed to be on both doses of Armour Thyroid?

## 2018-06-09 LAB — POCT INR: INR: 4.3 — AB (ref 2.0–3.0)

## 2018-06-10 ENCOUNTER — Encounter: Payer: Self-pay | Admitting: Oncology

## 2018-06-10 ENCOUNTER — Telehealth (INDEPENDENT_AMBULATORY_CARE_PROVIDER_SITE_OTHER): Payer: Medicare Other | Admitting: *Deleted

## 2018-06-10 DIAGNOSIS — D6859 Other primary thrombophilia: Secondary | ICD-10-CM | POA: Diagnosis not present

## 2018-06-10 DIAGNOSIS — D689 Coagulation defect, unspecified: Secondary | ICD-10-CM | POA: Diagnosis not present

## 2018-06-10 DIAGNOSIS — I82599 Chronic embolism and thrombosis of other specified deep vein of unspecified lower extremity: Secondary | ICD-10-CM | POA: Diagnosis not present

## 2018-06-10 DIAGNOSIS — Z7901 Long term (current) use of anticoagulants: Secondary | ICD-10-CM

## 2018-06-10 DIAGNOSIS — I2782 Chronic pulmonary embolism: Secondary | ICD-10-CM | POA: Diagnosis not present

## 2018-06-10 NOTE — Progress Notes (Signed)
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 INR 5.1 on 3/19. 4.3 today on home monitor. He was on 4 mg daily.  He skipped a dose yesterday. Advised to take 2 mg today then change dose to 4 mg daily except 2 mg on Tuesdays & Thursdays. Check INR again in 2 weeks to allow time to get to steady state.

## 2018-06-10 NOTE — Telephone Encounter (Signed)
Received faxed report from [facility] with results of patient's INR collected 02/28/2018 to 06/09/2018. Last result on 06/09/2018 is 4.3. Placed in Dr. Julianne Rice box. Hubbard Hartshorn, RN, BSN

## 2018-06-21 NOTE — Progress Notes (Addendum)
Virtual Visit via Video Note  I connected with patient on 06/22/18 at 11:00 AM EDT by a video enabled telemedicine application and verified that I am speaking with the correct person using two identifiers.   THIS ENCOUNTER IS A VIRTUAL VISIT DUE TO COVID-19 - PATIENT WAS NOT SEEN IN THE OFFICE. PATIENT HAS CONSENTED TO VIRTUAL VISIT / TELEMEDICINE VISIT   Location of patient: home  Location of provider: office  I discussed the limitations of evaluation and management by telemedicine and the availability of in person appointments. The patient expressed understanding and agreed to proceed.   Subjective:   Newton Frutiger is a 73 y.o. male who presents for Medicare Annual/Subsequent preventive examination.  Review of Systems: No ROS.  Medicare Wellness Visit. Additional risk factors are reflected in the social history. Cardiac Risk Factors include: advanced age (>60men, >62 women);dyslipidemia Sleep patterns: no issues Home Safety/Smoke Alarms: Feels safe in home. Smoke alarms in place.   Male:   CCS- 06/19/16 per pt.     PSA-  Lab Results  Component Value Date   PSA 1.53 07/18/2016   PSA 1.63 10/14/2011       Objective:    Vitals: Unable to assess. This visit is enabled though telemedicine due to Covid 19.   Advanced Directives 06/22/2018 02/01/2018 11/17/2017 01/07/2016 11/05/2015 01/09/2015 09/11/2014  Does Patient Have a Medical Advance Directive? No Yes Yes Yes No Yes Yes  Type of Advance Directive - [place];Living will [place];Living will Living will;Healthcare Power of [place];Living will [place]  Does patient want to make changes to medical advance directive? - - No - Patient declined No - Patient declined - - No - Patient declined  Copy of [place] in Chart? - No - copy requested No - copy requested No - copy requested - No - copy requested No - copy requested  Would  patient like information on creating a medical advance directive? No - Patient declined - - - No - patient declined information - -    Tobacco Social History   Tobacco Use  Smoking Status Former Smoker   Packs/day: 1.00   Years: 20.00   Pack years: 20.00   Types: Cigarettes   Last attempt to quit: 03/03/1980   Years since quitting: 38.3  Smokeless Tobacco Never Used     Counseling given: Not Answered   Clinical Intake: Pain : No/denies pain     Past Medical History:  Diagnosis Date   Acid reflux 02/03/2016   Allergy    seasonal   Anxiety 12/01/2016   Chronic anticoagulation 06/14/2013   Coagulopathy ([place]) 05/16/2011   Collagen vascular disease ([place])    Cough 12/01/2016   DVT (deep venous thrombosis) ([place])    takes coumadin   Dysuria 02/27/2013   Erectile dysfunction 12/28/2016   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   Hyperglycemia 12/26/2014   Hyperlipidemia    Hypothyroid 06/10/2011   Insomnia 04/24/2016   Left hip pain 12/17/2010   MCI (mild cognitive impairment) 07/22/2016   Nasal lesion 07/22/2016   Polymyalgia ([place]) 03/21/2014   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   Preventative health care 12/17/2010   Shingles 1982   right abdominal wall   Staph skin infection 07/22/2016   Thyroid nodule 11/29/2014   Urinary hesitancy 09/30/2016   Vitamin D deficiency 10/14/2011   Past Surgical History:  Procedure Laterality Date   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   48 surgeries   Family History  Problem Relation Age of Onset   Cancer Mother        unknown- everywhere   Cancer Sister        breast   Social History   Socioeconomic History   Marital status: Married    Spouse name: Not on file   Number of children: Not on file   Years of education: Not on file   Highest education level: Not on file  Occupational History   Not on file  Social Needs   Financial resource strain: Not on file   Food  insecurity:    Worry: Not on file    Inability: Not on file   Transportation needs:    Medical: Not on file    Non-medical: Not on file  Tobacco Use   Smoking status: Former Smoker    Packs/day: 1.00    Years: 20.00    Pack years: 20.00    Types: Cigarettes    Last attempt to quit: 03/03/1980    Years since quitting: 38.3   Smokeless tobacco: Never Used  Substance and Sexual Activity   Alcohol use: No    Alcohol/week: 0.0 standard drinks   Drug use: No   Sexual activity: Not on file  Lifestyle   Physical activity:    Days per week: Not on file    Minutes per session: Not on file   Stress: Not on file  Relationships   Social connections:    Talks on phone: Not on file    Gets together: Not on file    Attends religious service: Not on file    Active member of club or organization: Not on file    Attends meetings of clubs or organizations: Not on file    Relationship status: Not on file  Other Topics Concern   Not on file  Social History Narrative   Not on file    Outpatient Encounter Medications as of 06/22/2018  Medication Sig   b complex vitamins tablet Take 1 tablet by mouth daily.     cetirizine (ZYRTEC) 10 MG tablet TAKE 1 TABLET BY MOUTH EVERY DAY AS NEEDED FOR ALLERGIES   Cholecalciferol 4000 UNITS TABS Take 1 tablet by mouth daily.   Coenzyme Q10 (COQ10 PO) Take 2 tablets by mouth daily.   Cranberry 250 MG TABS Take 1 tablet by mouth daily.   DULoxetine (CYMBALTA) 30 MG capsule Take 30 mg by mouth 2 (two) times daily.   enoxaparin (LOVENOX) 120 MG/0.8ML injection Inject 0.8 mLs (120 mg total) into the skin daily. Use only if INR < 2.0 or as instructed, discontinue when INR 2.0-3.0   montelukast (SINGULAIR) 10 MG tablet Take 1 tablet (10 mg total) by mouth at bedtime.   NON FORMULARY AHCC immune support   tacrolimus (PROTOPIC) 0.1 % ointment Apply topically 2 (two) times daily.   thyroid (ARMOUR THYROID) 60 MG tablet 60 mg tab 1 tab po M, W,  Th, F, Sun   thyroid (ARMOUR THYROID) 90 MG tablet TAKE 1 TABLET BY MOUTH DAILY ON TUESDAY AND SATURDAY   TURMERIC PO Take 1,200 mg by mouth daily.   warfarin (COUMADIN) 4 MG tablet Take 1 tablet (4 mg total) by mouth daily. Adjust dose as instructed   NIACIN PO Take 1 tablet by mouth 3 (three) times daily.    Red Yeast Rice Extract (RED YEAST RICE PO) Take 1,200 mg by mouth 2 (two) times daily at 10  AM and 5 PM.   [DISCONTINUED] fluconazole (DIFLUCAN) 150 MG tablet 1 tab po weekly prn rash   [DISCONTINUED] thyroid (ARMOUR THYROID) 90 MG tablet TAKE 1 TABLET BY MOUTH BY MOUTH ON [id number]   Facility-Administered Encounter Medications as of 06/22/2018  Medication   triamcinolone acetonide (KENALOG) 10 MG/ML injection 10 mg    Activities of Daily Living In your present state of health, do you have any difficulty performing the following activities: 06/22/2018 02/01/2018  Hearing? N N  Vision? N N  Difficulty concentrating or making decisions? N N  Walking or climbing stairs? N N  Dressing or bathing? N N  Doing errands, shopping? N N  Preparing Food and eating ? N -  Using the Toilet? N -  In the past six months, have you accidently leaked urine? N -  Do you have problems with loss of bowel control? N -  Managing your Medications? N -  Managing your Finances? N -  Housekeeping or managing your Housekeeping? N -  Some recent data might be hidden    Patient Care Team: [name], MD as PCP - General (Family Medicine) Roena Malady, MD (Ophthalmology) Forde Dandy, PharmD as Pharmacist (Pharmacist)   Assessment:   This is a routine wellness examination for [place]. Physical assessment deferred to PCP.  Exercise Activities and Dietary recommendations Current Exercise Habits: Home exercise routine, Type of exercise: walking, Time (Minutes): 30, Frequency (Times/Week): 7, Weekly Exercise (Minutes/Week): 210, Intensity: Mild, Exercise limited by: None  identified   Diet (meal preparation, eat out, water intake, caffeinated beverages, dairy products, fruits and vegetables): well balanced   Goals     Continue to walk daily and eat a healthy diet       Fall Risk Fall Risk  06/22/2018 02/01/2018 11/17/2017 09/21/2017 01/07/2016  Falls in the past year? 0 1 No No No  Number falls in past yr: - 0 - - -  Injury with Fall? - 1 - - -  Risk for fall due to : - Other (Comment) - - -  Risk for fall due to: Comment - on warfarin - - -  Follow up - Falls prevention discussed - - -    Depression Screen PHQ 2/9 Scores 06/22/2018 02/01/2018 11/17/2017 01/07/2016  PHQ - 2 Score 0 0 0 0  PHQ- 9 Score - 0 - -    Cognitive Function Ad8 score reviewed for issues:  Issues making decisions:no  Less interest in hobbies / activities:no  Repeats questions, stories (family complaining):no  Trouble using ordinary gadgets (microwave, computer, phone):no  Forgets the month or year: no  Mismanaging finances: no  Remembering appts:no  Daily problems with thinking and/or memory:no Ad8 score is=0    Montreal Cognitive Assessment  09/21/2017  Visuospatial/ Executive (0/5) 5  Naming (0/3) 3  Attention: Read list of digits (0/2) 2  Attention: Read list of letters (0/1) 1  Attention: Serial 7 subtraction starting at 100 (0/3) 2  Language: Repeat phrase (0/2) 2  Language : Fluency (0/1) 1  Abstraction (0/2) 2  Delayed Recall (0/5) 4  Orientation (0/6) 6  Total 28      Immunization History  Administered Date(s) Administered   Hepatitis A 11/09/1998   IPV 11/09/1998   Influenza Split 11/22/2015   Influenza Whole 12/13/2012   Influenza, High Dose Seasonal PF 10/31/2016   Influenza,inj,Quad PF,6+ Mos 10/25/2013, 12/26/2014   Influenza-Unspecified 12/02/2011, 12/02/2017, 12/02/2017   PPD Test 03/23/2015   Pneumococcal Conjugate-13 10/25/2013  Pneumococcal Polysaccharide-23 06/19/2009, 12/26/2014   Td 03/03/1990, 07/01/2001   Tdap  12/17/2010   Typhoid Inactivated 11/09/1998, 03/09/2001   Zoster 08/01/2008    Screening Tests Health Maintenance  Topic Date Due   FOOT EXAM  08/24/1955   OPHTHALMOLOGY EXAM  [birth year]24/1955   URINE MICROALBUMIN  08[birth year]24/1955   HEMOGLOBIN A1C  05/06/2018   INFLUENZA VACCINE  10/02/2018   TETANUS/TDAP  12/16/2020   COLONOSCOPY  06/20/2026   Hepatitis C Screening  Completed   PNA vac Low Risk Adult  Completed       Plan:    Please schedule your next medicare wellness visit with me in 1 yr.  Continue to eat heart healthy diet (full of fruits, vegetables, whole grains, lean protein, water--limit salt, fat, and sugar intake) and increase physical activity as tolerated.  Continue doing brain stimulating activities (puzzles, reading, adult coloring books, staying active) to keep memory sharp.     I have personally reviewed and noted the following in the patients chart:    Medical and social history  Use of alcohol, tobacco or illicit drugs   Current medications and supplements  Functional ability and status  Nutritional status  Physical activity  Advanced directives  List of other physicians  Hospitalizations, surgeries, and ER visits in previous 12 months  Vitals  Screenings to include cognitive, depression, and falls  Referrals and appointments  In addition, I have reviewed and discussed with patient certain preventive protocols, quality metrics, and best practice recommendations. A written personalized care plan for preventive services as well as general preventive health recommendations were provided to patient.     Shela Nevin, [place]  06/22/2018  Medical screening examination/treatment was performed by qualified clinical staff member and as supervising physician I was immediately available for consultation/collaboration. I have reviewed documentation and agree with assessment and plan.  Penni Homans, MD

## 2018-06-22 ENCOUNTER — Ambulatory Visit (INDEPENDENT_AMBULATORY_CARE_PROVIDER_SITE_OTHER): Payer: Medicare Other | Admitting: *Deleted

## 2018-06-22 ENCOUNTER — Encounter: Payer: Self-pay | Admitting: *Deleted

## 2018-06-22 ENCOUNTER — Other Ambulatory Visit: Payer: Self-pay

## 2018-06-22 DIAGNOSIS — Z0000 Encounter for general adult medical examination without abnormal findings: Secondary | ICD-10-CM

## 2018-06-22 NOTE — Patient Instructions (Signed)
Please schedule your next medicare wellness visit with me in 1 yr.  Continue to eat heart healthy diet (full of fruits, vegetables, whole grains, lean protein, water--limit salt, fat, and sugar intake) and increase physical activity as tolerated.  Continue doing brain stimulating activities (puzzles, reading, adult coloring books, staying active) to keep memory sharp.    Paul Ryan , Thank you for taking time to come for your Medicare Wellness Visit. I appreciate your ongoing commitment to your health goals. Please review the following plan we discussed and let me know if I can assist you in the future.   These are the goals we discussed: Goals    . Continue to walk daily and eat a healthy diet       This is a list of the screening recommended for you and due dates:  Health Maintenance  Topic Date Due  . Complete foot exam   08/24/1955  . Eye exam for diabetics  08/24/1955  . Urine Protein Check  08[birth year]24/1955  . Hemoglobin A1C  05/06/2018  . Flu Shot  10/02/2018  . Tetanus Vaccine  12/16/2020  . Colon Cancer Screening  06/20/2026  .  Hepatitis C: One time screening is recommended by Center for Disease Control  (CDC) for  adults born from [birth year] through 1966.   Completed  . Pneumonia vaccines  Completed

## 2018-07-05 ENCOUNTER — Telehealth: Payer: Self-pay | Admitting: Pharmacist

## 2018-07-05 NOTE — Telephone Encounter (Signed)
I am willing to take over management of the coumadin

## 2018-07-05 NOTE — Telephone Encounter (Signed)
Reviewed Thanks drG 

## 2018-07-05 NOTE — Telephone Encounter (Signed)
Spoke with [facility] as per instructions upon their faxed results RE: Paul Ryan. They were apprised to CHANGE PARAMETERS of Physician of Record to Willette Alma, MD Primary Care Provider as the patient no longer sees a provider in our clinic. Office Phone Number and Fax Number listed within [facility]EP[facility] were provided to [facility] who indicated they would fax to Dr. Charlett Blake the required form for her to execute relative to her management (and receipt of INR results as provided by the patient to [facility]) of warfarin therapy for the patient. Currently the prescribed patient range is 2.0 - 3.0 with test frequency 2 - 4 tests per month for DIAGNOSTIC CODES: D68.59, 126.99 and Z79.01

## 2018-07-13 ENCOUNTER — Encounter: Payer: Self-pay | Admitting: *Deleted

## 2018-07-13 NOTE — Progress Notes (Signed)
Per Dr. Benay Spice: Patient does not need to be seen at this time during COVID precautions. This is a very elective visit. Message sent to Seth Bake, new patient scheduler.

## 2018-07-14 ENCOUNTER — Telehealth: Payer: Self-pay | Admitting: Oncology

## 2018-07-14 NOTE — Telephone Encounter (Signed)
Received a staff msg from Dr. Benay Spice to r/s the pt's appt for 2 months. Pt has been cld and told me to put an appt in and he will check is mychart. I rescheduled hime to see Dr. Benay Spice to 7/16 at 1230pm.

## 2018-07-15 ENCOUNTER — Encounter: Payer: Medicare Other | Admitting: Oncology

## 2018-07-16 ENCOUNTER — Encounter: Payer: Self-pay | Admitting: Family Medicine

## 2018-07-16 ENCOUNTER — Other Ambulatory Visit: Payer: Self-pay | Admitting: Family Medicine

## 2018-07-16 MED ORDER — METRONIDAZOLE 500 MG PO TABS: 500.0000 mg | ORAL_TABLET | Freq: Three times a day (TID) | ORAL | 0 refills | Status: DC

## 2018-07-27 ENCOUNTER — Telehealth: Payer: Self-pay | Admitting: *Deleted

## 2018-07-27 NOTE — Telephone Encounter (Signed)
Received fax from [facility] with INR results from Jan 6 to Jul 26, 2018. Last INR 5.1 on 07/25/2018. States VM message was left for Dr. Maudie Mercury. Results placed in Dr. Gladstone Pih box. Hubbard Hartshorn, RN, BSN

## 2018-07-28 NOTE — Telephone Encounter (Signed)
See documentation in EHR. Dr. Vivien Rossetti, PCP has assumed responsibility for warfarin management of this patient since he does not receive his primary care here, and with Dr. Azucena Freed retirement, Dr. Randel Pigg has agreed to management of the warfarin for her patient, Paul Ryan.

## 2018-09-01 ENCOUNTER — Encounter: Payer: Self-pay | Admitting: *Deleted

## 2018-09-01 LAB — PROTIME-INR

## 2018-09-01 NOTE — Progress Notes (Signed)
Received home INR checks from [facility]. Forwarded to Dr. Penni Homans, who is following his PT/INR results and warfarin dosing. Copy provided to Dr. Benay Spice for visit on 09/16/18.

## 2018-09-16 ENCOUNTER — Inpatient Hospital Stay: Payer: Medicare Other | Attending: Oncology | Admitting: Oncology

## 2018-09-16 ENCOUNTER — Other Ambulatory Visit: Payer: Self-pay

## 2018-09-16 VITALS — BP 115/89 | HR 69 | Temp 98.9°F | Resp 17 | Ht 72.0 in | Wt 164.6 lb

## 2018-09-16 DIAGNOSIS — D6859 Other primary thrombophilia: Secondary | ICD-10-CM | POA: Diagnosis not present

## 2018-09-16 DIAGNOSIS — E039 Hypothyroidism, unspecified: Secondary | ICD-10-CM | POA: Insufficient documentation

## 2018-09-16 DIAGNOSIS — Z86711 Personal history of pulmonary embolism: Secondary | ICD-10-CM | POA: Insufficient documentation

## 2018-09-16 DIAGNOSIS — Z8615 Personal history of latent tuberculosis infection: Secondary | ICD-10-CM | POA: Diagnosis not present

## 2018-09-16 DIAGNOSIS — Z803 Family history of malignant neoplasm of breast: Secondary | ICD-10-CM | POA: Diagnosis not present

## 2018-09-16 DIAGNOSIS — K219 Gastro-esophageal reflux disease without esophagitis: Secondary | ICD-10-CM | POA: Diagnosis not present

## 2018-09-16 DIAGNOSIS — Z86718 Personal history of other venous thrombosis and embolism: Secondary | ICD-10-CM | POA: Diagnosis not present

## 2018-09-16 DIAGNOSIS — E785 Hyperlipidemia, unspecified: Secondary | ICD-10-CM | POA: Insufficient documentation

## 2018-09-16 DIAGNOSIS — Z7901 Long term (current) use of anticoagulants: Secondary | ICD-10-CM | POA: Insufficient documentation

## 2018-09-16 DIAGNOSIS — D689 Coagulation defect, unspecified: Secondary | ICD-10-CM

## 2018-09-16 NOTE — Progress Notes (Signed)
[facility] New Patient Consult   Requesting MD: [name], Md [address] [place],  [place] [address]   Paul Ryan 73 y.o.  11/27/1945    Reason for Consult: Protein S deficiency, chronic anticoagulation   HPI: Paul Ryan had an unprovoked right lower extremity DVT in June 2010.  He was treated with a 1 year course of Coumadin anticoagulation.  Protein C and protein S levels were checked while off of Coumadin.  He was noted to have a reproducible decrease in functional protein S with a borderline decrease in free protein S.  Coumadin was resumed. In April 2015 anticoagulation therapy was changed to Xarelto.  He had a cough while visiting [place] with dyspnea and pleuritic chest pain in June 2016.  A CT of the chest on 08/07/2014 revealed a left lower lobe pulmonary embolus.  Xarelto was discontinued and he was placed back on Coumadin.  He continues Coumadin anticoagulation with home monitoring of the PT/INR.  The goal INR is 2-3.  He takes bridging Lovenox when the INR drops to less than 2.  He performs home monitoring every 2 weeks. He denies bleeding and symptoms of thrombosis.  Past Medical History:  Diagnosis Date  . Acid reflux 02/03/2016  . Allergy    seasonal  . Anxiety 12/01/2016  . Chronic anticoagulation 06/14/2013  . Coagulopathy ([place]) 05/16/2011  . Collagen vascular disease ([place])   . Cough 12/01/2016  . DVT (deep venous thrombosis) ([name])    takes coumadin  . Dysuria 02/27/2013  . Erectile dysfunction 12/28/2016  . 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 Hyperglycemia 12/26/2014  . Hyperlipidemia   . Hypothyroid 06/10/2011  . Insomnia 04/24/2016  . Left hip pain 12/17/2010  . MCI (mild cognitive impairment) 07/22/2016  . Nasal lesion 07/22/2016  . Polymyalgia ([place]) 03/21/2014  . 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 Preventative health care 12/17/2010  . Shingles 1982   right abdominal wall  . Staph skin infection  07/22/2016  . Thyroid nodule 11/29/2014  . Urinary hesitancy 09/30/2016  . Vitamin D deficiency 10/14/2011    .  Pulmonary embolism June 2016   .  Memory loss   .  Neuropathy in the feet  Past Surgical History:  Procedure Laterality Date  . 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  A sister died of breast cancer at age 48.  Mother died of "cancer ".  No other family history of cancer.  No family history of thromboembolic disease. Social History:   He lives with his wife in [place].  He is retired Solicitor.  He does not use cigarettes or alcohol.  ROS:   Positives include: Memory loss, peripheral neuropathy-pain improved with Cymbalta  A complete ROS was otherwise negative.  Physical Exam: Physical examination not performed today secondary to distancing with the coded pandemic  LAB:  CBC  Lab Results  Component Value Date   WBC 6.2 11/05/2017   HGB 15.5 11/05/2017   HCT 46.5 11/05/2017   MCV 90.0 11/05/2017   PLT 209.0 11/05/2017   NEUTROABS 4.6 04/16/2017       11/12/2009: Protein S functional 48, protein S total 82, protein S free 60 01/01/2010: Protein S functional 48, protein S total 87, protein S free 60   Assessment/Plan:   1.  Protein S deficiency  Right lower extremity deep vein thrombosis, unprovoked, June 2010 treated with 1 year of Coumadin anticoagulation  Placed back on Coumadin after protein S level returned low while off of Coumadin  Anticoagulation changed to Xarelto April 2015  Pulmonary embolism 08/07/2014- anticoagulation changed back to Coumadin  2.  History of latent tuberculosis treated with [name] in 2017 3.  Hypothyroidism 4.  History of collagen vascular disease-unspecified 5.  Right abdominal wall zoster 1982    Disposition:   Paul Ryan is maintained on chronic Coumadin anticoagulation in  the setting of protein S deficiency and recurrent venous thromboembolic disease.  The goal INR is 2-3.  He will continue self PT/INR monitoring.  He requested we manage the Coumadin anticoagulation.  The INR results will be forwarded to [place] for adjustment of the Coumadin dose.  He will be scheduled for an office visit in 9 months.  I am available to see him sooner as needed.  Betsy Coder, MD  09/16/2018, 8:10 AM

## 2018-09-17 ENCOUNTER — Telehealth: Payer: Self-pay | Admitting: Oncology

## 2018-09-17 NOTE — Telephone Encounter (Signed)
Scheduled per los. Mailed printout  °

## 2018-09-22 LAB — PROTIME-INR: INR: 3.5 — AB (ref 0.9–1.1)

## 2018-09-23 ENCOUNTER — Other Ambulatory Visit: Payer: Self-pay | Admitting: *Deleted

## 2018-09-23 ENCOUNTER — Encounter: Payer: Self-pay | Admitting: Oncology

## 2018-09-23 DIAGNOSIS — I82509 Chronic embolism and thrombosis of unspecified deep veins of unspecified lower extremity: Secondary | ICD-10-CM

## 2018-09-23 MED ORDER — WARFARIN SODIUM 4 MG PO TABS: 4.0000 mg | ORAL_TABLET | Freq: Every day | ORAL | 3 refills | Status: DC

## 2018-09-23 NOTE — Progress Notes (Signed)
My chart message received regarding recent INR 3.5 on 7/22.  He is taking 4 mg coumadin 6 days/week and 2 mg one day/week. Held dose last night. Will recheck in 2 weeks w/home monitor. MD notified

## 2018-09-30 ENCOUNTER — Telehealth: Payer: Self-pay | Admitting: Family Medicine

## 2018-09-30 NOTE — Telephone Encounter (Signed)
Patient needs INR route to PCP

## 2018-10-12 ENCOUNTER — Encounter: Payer: Self-pay | Admitting: Oncology

## 2018-10-14 ENCOUNTER — Other Ambulatory Visit: Payer: Self-pay | Admitting: Family Medicine

## 2018-10-26 ENCOUNTER — Encounter: Payer: Self-pay | Admitting: Family Medicine

## 2018-10-27 NOTE — Telephone Encounter (Signed)
I do not think he needs a pneumonia shot, but can you double check?

## 2018-10-29 NOTE — Telephone Encounter (Signed)
Please set him up for a flu shot and let him know. His prevnar was in 2015, his pneumovax was in 2016. You can repeat them every 5-10 years if he wants to repeat the Prevnar he can but it is covered better at the pharmacy I believe. Thanks

## 2018-11-01 NOTE — Telephone Encounter (Signed)
Pt stated that he would like to get the Prevnar again. Please send RX to  [facility], [place] [phone] (Phone) [phone] (Fax)   Pt asked if he needs the new Shingrix vaccine. Please advise.

## 2018-11-03 NOTE — Telephone Encounter (Signed)
Notified pt of below. He will check with insurance and call back to schedule NV or have order sent to pharmacy.

## 2018-11-12 ENCOUNTER — Encounter: Payer: Self-pay | Admitting: Family Medicine

## 2018-11-12 DIAGNOSIS — E559 Vitamin D deficiency, unspecified: Secondary | ICD-10-CM

## 2018-11-12 DIAGNOSIS — R739 Hyperglycemia, unspecified: Secondary | ICD-10-CM

## 2018-11-12 DIAGNOSIS — E039 Hypothyroidism, unspecified: Secondary | ICD-10-CM

## 2018-11-12 DIAGNOSIS — E785 Hyperlipidemia, unspecified: Secondary | ICD-10-CM

## 2018-11-16 NOTE — Telephone Encounter (Signed)
Labs have been placed  Please schedule patient for labs and a flu shot the same day Then appt with pcp

## 2018-11-16 NOTE — Telephone Encounter (Signed)
Labs have been scheduled. Pt had flu shot 9/4 and [facility]st [facility] [address]. Please update chart.

## 2018-11-19 ENCOUNTER — Other Ambulatory Visit (INDEPENDENT_AMBULATORY_CARE_PROVIDER_SITE_OTHER): Payer: Medicare Other

## 2018-11-19 ENCOUNTER — Other Ambulatory Visit: Payer: Self-pay

## 2018-11-19 DIAGNOSIS — E785 Hyperlipidemia, unspecified: Secondary | ICD-10-CM

## 2018-11-19 DIAGNOSIS — E559 Vitamin D deficiency, unspecified: Secondary | ICD-10-CM

## 2018-11-19 DIAGNOSIS — E039 Hypothyroidism, unspecified: Secondary | ICD-10-CM | POA: Diagnosis not present

## 2018-11-19 DIAGNOSIS — R739 Hyperglycemia, unspecified: Secondary | ICD-10-CM | POA: Diagnosis not present

## 2018-11-19 LAB — CBC
HCT: 46.7 % (ref 39.0–52.0)
Hemoglobin: 15.4 g/dL (ref 13.0–17.0)
MCHC: 33 g/dL (ref 30.0–36.0)
MCV: 91.2 fl (ref 78.0–100.0)
Platelets: 223 10*3/uL (ref 150.0–400.0)
RBC: 5.12 Mil/uL (ref 4.22–5.81)
RDW: 14.1 % (ref 11.5–15.5)
WBC: 7.3 10*3/uL (ref 4.0–10.5)

## 2018-11-19 LAB — LIPID PANEL
Cholesterol: 296 mg/dL — ABNORMAL HIGH (ref 0–200)
HDL: 38 mg/dL — ABNORMAL LOW (ref >39.00)
LDL Cholesterol: 222 mg/dL — ABNORMAL HIGH (ref 0–99)
NonHDL: 258.35
Total CHOL/HDL Ratio: 8
Triglycerides: 181 mg/dL — ABNORMAL HIGH (ref 0.0–149.0)
VLDL: 36.2 mg/dL (ref 0.0–40.0)

## 2018-11-19 LAB — COMPREHENSIVE METABOLIC PANEL
ALT: 23 U/L (ref 0–53)
AST: 20 U/L (ref 0–37)
Albumin: 4.2 g/dL (ref 3.5–5.2)
Alkaline Phosphatase: 57 U/L (ref 39–117)
BUN: 16 mg/dL (ref 6–23)
CO2: 29 mEq/L (ref 19–32)
Calcium: 9.5 mg/dL (ref 8.4–10.5)
Chloride: 103 mEq/L (ref 96–112)
Creatinine, Ser: 1.08 mg/dL (ref 0.40–1.50)
GFR: 66.79 mL/min (ref >60.00)
Glucose, Bld: 96 mg/dL (ref 70–99)
Potassium: 4.9 mEq/L (ref 3.5–5.1)
Sodium: 139 mEq/L (ref 135–145)
Total Bilirubin: 0.9 mg/dL (ref 0.2–1.2)
Total Protein: 7 g/dL (ref 6.0–8.3)

## 2018-11-19 LAB — HEMOGLOBIN A1C: Hgb A1c MFr Bld: 6.2 % (ref 4.6–6.5)

## 2018-11-19 LAB — VITAMIN D 25 HYDROXY (VIT D DEFICIENCY, FRACTURES): VITD: 77.36 ng/mL (ref 30.00–100.00)

## 2018-11-19 LAB — T4, FREE: Free T4: 0.68 ng/dL (ref 0.60–1.60)

## 2018-11-19 LAB — TSH: TSH: 0.16 u[IU]/mL — ABNORMAL LOW (ref 0.35–4.50)

## 2018-11-26 NOTE — Progress Notes (Signed)
Called patient about meds left message for patient to call the office back

## 2018-12-07 ENCOUNTER — Encounter: Payer: Self-pay | Admitting: Family Medicine

## 2018-12-07 MED ORDER — ROSUVASTATIN CALCIUM 5 MG PO TABS: 5.0000 mg | ORAL_TABLET | Freq: Every day | ORAL | 1 refills | Status: DC

## 2018-12-08 ENCOUNTER — Encounter: Payer: Self-pay | Admitting: Oncology

## 2018-12-09 ENCOUNTER — Encounter: Payer: Self-pay | Admitting: Oncology

## 2018-12-26 ENCOUNTER — Encounter: Payer: Self-pay | Admitting: Family Medicine

## 2018-12-30 ENCOUNTER — Encounter: Payer: Self-pay | Admitting: Oncology

## 2019-01-07 ENCOUNTER — Other Ambulatory Visit: Payer: Self-pay | Admitting: Family Medicine

## 2019-01-24 ENCOUNTER — Other Ambulatory Visit: Payer: Self-pay | Admitting: Specialist

## 2019-01-24 DIAGNOSIS — H532 Diplopia: Secondary | ICD-10-CM

## 2019-01-24 DIAGNOSIS — D6859 Other primary thrombophilia: Secondary | ICD-10-CM

## 2019-01-24 DIAGNOSIS — G459 Transient cerebral ischemic attack, unspecified: Secondary | ICD-10-CM

## 2019-01-24 DIAGNOSIS — G250 Essential tremor: Secondary | ICD-10-CM

## 2019-02-09 ENCOUNTER — Telehealth: Payer: Self-pay

## 2019-02-09 NOTE — Telephone Encounter (Signed)
Copied from [place] [id number]. Topic: General - Other >> Feb 09, 2019  2:33 PM Rainey [place] A wrote: [place] from Remote INR  called to inform Dr. Charlett Blake that patients  INR was checked on the 12/5 and read 1.2

## 2019-02-09 NOTE — Telephone Encounter (Signed)
I need an appt with him soon confirm with him his current coumadin dose and we can adjust coumadin

## 2019-02-10 NOTE — Telephone Encounter (Signed)
Called patient unable to leave message  Please schedule patient a VV soon we can squeeze him in and need to know what he is taking of his coudmadin

## 2019-02-11 ENCOUNTER — Other Ambulatory Visit: Payer: Self-pay

## 2019-02-11 ENCOUNTER — Telehealth: Payer: Self-pay | Admitting: *Deleted

## 2019-02-11 ENCOUNTER — Encounter: Payer: Self-pay | Admitting: Family Medicine

## 2019-02-11 ENCOUNTER — Ambulatory Visit (INDEPENDENT_AMBULATORY_CARE_PROVIDER_SITE_OTHER): Payer: Medicare Other | Admitting: Family Medicine

## 2019-02-11 VITALS — BP 112/83 | Wt 165.0 lb

## 2019-02-11 DIAGNOSIS — E559 Vitamin D deficiency, unspecified: Secondary | ICD-10-CM

## 2019-02-11 DIAGNOSIS — E039 Hypothyroidism, unspecified: Secondary | ICD-10-CM | POA: Diagnosis not present

## 2019-02-11 DIAGNOSIS — R739 Hyperglycemia, unspecified: Secondary | ICD-10-CM

## 2019-02-11 DIAGNOSIS — D689 Coagulation defect, unspecified: Secondary | ICD-10-CM

## 2019-02-11 DIAGNOSIS — E785 Hyperlipidemia, unspecified: Secondary | ICD-10-CM

## 2019-02-11 MED ORDER — ROSUVASTATIN CALCIUM 10 MG PO TABS: 10.0000 mg | ORAL_TABLET | Freq: Every day | ORAL | 1 refills | Status: DC

## 2019-02-11 NOTE — Assessment & Plan Note (Signed)
Is now following with with oncology again, uses a home meter and Dr Benay Spice manages the numbers. He recently had an endoscopy and had to stop coumadin for 3 days since they had to do a biopsy

## 2019-02-11 NOTE — Telephone Encounter (Signed)
Lab scheduled 3/11 VV scheduled 3/18

## 2019-02-11 NOTE — Telephone Encounter (Signed)
Patient needs lab appointment in 3 months (orders in) and virtual visit follow up a few days later after lab appointment.

## 2019-02-12 ENCOUNTER — Encounter: Payer: Self-pay | Admitting: Oncology

## 2019-02-12 ENCOUNTER — Encounter: Payer: Self-pay | Admitting: Family Medicine

## 2019-02-13 ENCOUNTER — Other Ambulatory Visit: Payer: Self-pay

## 2019-02-13 ENCOUNTER — Ambulatory Visit
Admission: RE | Admit: 2019-02-13 | Discharge: 2019-02-13 | Disposition: A | Discharge status: Home / Self Care | Payer: Medicare Other | Source: Ambulatory Visit | Attending: Specialist | Admitting: Specialist

## 2019-02-13 DIAGNOSIS — G459 Transient cerebral ischemic attack, unspecified: Secondary | ICD-10-CM

## 2019-02-13 DIAGNOSIS — H532 Diplopia: Secondary | ICD-10-CM

## 2019-02-13 DIAGNOSIS — G250 Essential tremor: Secondary | ICD-10-CM

## 2019-02-13 DIAGNOSIS — D6859 Other primary thrombophilia: Secondary | ICD-10-CM

## 2019-02-13 MED ORDER — GADOBENATE DIMEGLUMINE 529 MG/ML IV SOLN
15.0000 mL | Freq: Once | INTRAVENOUS | Status: AC | PRN
  Administered 2019-02-13: 15 mL via INTRAVENOUS

## 2019-02-14 NOTE — Assessment & Plan Note (Signed)
hgba1c acceptable, minimize simple carbs. Increase exercise as tolerated.  

## 2019-02-14 NOTE — Progress Notes (Signed)
Virtual Visit via phone Note  I connected with Paul Ryan on 02/11/19 at  9:00 AM EST by a phone enabled telemedicine application and verified that I am speaking with the correct person using two identifiers.  Location: Patient: home Provider: home   I discussed the limitations of evaluation and management by telemedicine and the availability of in person appointments. The patient expressed understanding and agreed to proceed. CMA was able to get the patient set up on a phone visit after being unable to set up a video visit   Subjective:    Patient ID: Paul Ryan, male    DOB: 03-01-1946, 73 y.o.   MRN: [id number]  Chief Complaint  Patient presents with  . follow up INR    HPI Patient is in today for follow up on chronic medical concerns including vitamin D deficiency, hyperlipidemia, thyroid disease and more. No recent febrile illness or hospitalizations. No polyuria or polydipsia. Denies CP/palp/SOB/HA/congestion/fevers/GI or GU c/o. Taking meds as prescribed. He is maintaining heart healthy diet and staying active. Is doing well under quarantine.   Past Medical History:  Diagnosis Date  . Acid reflux 02/03/2016  . Allergy    seasonal  . Anxiety 12/01/2016  . Chronic anticoagulation 06/14/2013  . Coagulopathy ([place]) 05/16/2011  . Collagen vascular disease ([place])   . Cough 12/01/2016  . DVT (deep venous thrombosis) ([name])    takes coumadin  . Dysuria 02/27/2013  . Erectile dysfunction 12/28/2016  . 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 Hyperglycemia 12/26/2014  . Hyperlipidemia   . Hypothyroid 06/10/2011  . Insomnia 04/24/2016  . Left hip pain 12/17/2010  . MCI (mild cognitive impairment) 07/22/2016  . Nasal lesion 07/22/2016  . Polymyalgia ([place]) 03/21/2014  . 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 Preventative health care 12/17/2010  . Shingles 1982   right abdominal wall  . Staph skin infection 07/22/2016  . Thyroid nodule 11/29/2014  . Urinary hesitancy  09/30/2016  . Vitamin D deficiency 10/14/2011    Past Surgical History:  Procedure Laterality Date  . 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 Cancer Mother        unknown- everywhere  . Cancer Sister        breast    Social History   Socioeconomic History  . Marital status: Married    Spouse name: Not on file  . Number of children: Not on file  . Years of education: Not on file  . Highest education level: Not on file  Occupational History  . Not on file  Tobacco Use  . Smoking status: Former Smoker    Packs/day: 1.00    Years: 20.00    Pack years: 20.00    Types: Cigarettes    Quit date: 03/03/1980    Years since quitting: 38.9  . Smokeless tobacco: Never Used  Substance and Sexual Activity  . Alcohol use: No    Alcohol/week: 0.0 standard drinks  . Drug use: No  . Sexual activity: Not on file  Other Topics Concern  . Not on file  Social History Narrative  . Not on file   Social Determinants of Health   Financial Resource Strain:   . Difficulty of Paying Living Expenses: Not on file  Food Insecurity:   . Worried About Charity fundraiser in the Last Year: Not on file  . Ran Out of Food in the  Last Year: Not on file  Transportation Needs:   . Lack of Transportation (Medical): Not on file  . Lack of Transportation (Non-Medical): Not on file  Physical Activity:   . Days of Exercise per Week: Not on file  . Minutes of Exercise per Session: Not on file  Stress:   . Feeling of Stress : Not on file  Social Connections:   . Frequency of Communication with Friends and Family: Not on file  . Frequency of Social Gatherings with Friends and Family: Not on file  . Attends Religious Services: Not on file  . Active Member of Clubs or Organizations: Not on file  . Attends Archivist Meetings: Not on file  . Marital Status: Not on file  Intimate Partner Violence:   . Fear of Current or  Ex-Partner: Not on file  . Emotionally Abused: Not on file  . Physically Abused: Not on file  . Sexually Abused: Not on file    Outpatient Medications Prior to Visit  Medication Sig Dispense Refill  . Ascorbic Acid (VITAMIN C ER PO) Take by mouth daily.    [name] Kitchen b complex vitamins tablet Take 1 tablet by mouth daily.      . cetirizine (ZYRTEC) 10 MG tablet TAKE 1 TABLET BY MOUTH EVERY DAY AS NEEDED FOR ALLERGIES 30 tablet 4  . Cholecalciferol 4000 UNITS TABS Take 1 tablet by mouth daily.    . Coenzyme Q10 (COQ10 PO) Take 2 tablets by mouth daily.    . Cranberry 250 MG TABS Take 1 tablet by mouth daily.    . DULoxetine (CYMBALTA) 30 MG capsule Take 30 mg by mouth 2 (two) times daily.    [name] Kitchen enoxaparin (LOVENOX) 120 MG/0.8ML injection Inject 0.8 mLs (120 mg total) into the skin daily. Use only if INR < 2.0 or as instructed, discontinue when INR 2.0-3.0 30 Syringe 3  . NON FORMULARY AHCC immune support    . pantoprazole (PROTONIX) 40 MG tablet Take 40 mg by mouth daily.    [name] Kitchen thyroid (ARMOUR THYROID) 60 MG tablet TAKE ONE TABLET BY MOUTH ON MONDAY, WEDNESDAY, THURSDAY, FRIDAY, AND SUNDAY. 65 tablet 0  . thyroid (ARMOUR THYROID) 90 MG tablet TAKE 1 TABLET BY MOUTH DAILY ON TUESDAY AND SATURDAY 24 tablet 2  . warfarin (COUMADIN) 4 MG tablet Take 1 tablet (4 mg total) by mouth daily. Adjust dose as instructed 90 tablet 3  . rosuvastatin (CRESTOR) 5 MG tablet Take 1 tablet (5 mg total) by mouth daily. 90 tablet 1  . tacrolimus (PROTOPIC) 0.1 % ointment Apply topically 2 (two) times daily. 100 g 1   Facility-Administered Medications Prior to Visit  Medication Dose Route Frequency Provider Last Rate Last Admin  . triamcinolone acetonide (KENALOG) 10 MG/ML injection 10 mg  10 mg Other Once Trula Slade, DPM        No Known Allergies  Review of Systems  Constitutional: Negative for fever and malaise/fatigue.  HENT: Negative for congestion.   Eyes: Negative for blurred vision.  Respiratory:  Negative for shortness of breath.   Cardiovascular: Negative for chest pain, palpitations and leg swelling.  Gastrointestinal: Negative for abdominal pain, blood in stool and nausea.  Genitourinary: Negative for dysuria and frequency.  Musculoskeletal: Negative for falls.  Skin: Negative for rash.  Neurological: Negative for dizziness, loss of consciousness and headaches.  Endo/Heme/Allergies: Negative for environmental allergies.  Psychiatric/Behavioral: Negative for depression. The patient is not nervous/anxious.        Objective:  Physical Exam unable to obtain via phone  BP 112/83   Wt 165 lb (74.8 kg)   BMI 22.38 kg/m  Wt Readings from Last 3 Encounters:  02/11/19 165 lb (74.8 kg)  09/16/18 164 lb 9 oz (74.6 kg)  02/01/18 161 lb 6.4 oz (73.2 kg)    Diabetic Foot Exam - Simple   No data filed     Lab Results  Component Value Date   WBC 7.3 11/19/2018   HGB 15.4 11/19/2018   HCT 46.7 11/19/2018   PLT 223.0 11/19/2018   GLUCOSE 96 11/19/2018   CHOL 296 (H) 11/19/2018   TRIG 181.0 (H) 11/19/2018   HDL 38.00 (L) 11/19/2018   LDLDIRECT 158.0 03/11/2013   LDLCALC 222 (H) 11/19/2018   ALT 23 11/19/2018   AST 20 11/19/2018   NA 139 11/19/2018   K 4.9 11/19/2018   CL 103 11/19/2018   CREATININE 1.08 11/19/2018   BUN 16 11/19/2018   CO2 29 11/19/2018   TSH 0.16 (L) 11/19/2018   PSA 1.53 07/18/2016   INR 3.5 (A) 09/22/2018   HGBA1C 6.2 11/19/2018    Lab Results  Component Value Date   TSH 0.16 (L) 11/19/2018   Lab Results  Component Value Date   WBC 7.3 11/19/2018   HGB 15.4 11/19/2018   HCT 46.7 11/19/2018   MCV 91.2 11/19/2018   PLT 223.0 11/19/2018   Lab Results  Component Value Date   NA 139 11/19/2018   K 4.9 11/19/2018   CHLORIDE 109 11/16/2012   CO2 29 11/19/2018   GLUCOSE 96 11/19/2018   BUN 16 11/19/2018   CREATININE 1.08 11/19/2018   BILITOT 0.9 11/19/2018   ALKPHOS 57 11/19/2018   AST 20 11/19/2018   ALT 23 11/19/2018   PROT 7.0  11/19/2018   ALBUMIN 4.2 11/19/2018   CALCIUM 9.5 11/19/2018   ANIONGAP 8 09/11/2014   GFR 66.79 11/19/2018   Lab Results  Component Value Date   CHOL 296 (H) 11/19/2018   Lab Results  Component Value Date   HDL 38.00 (L) 11/19/2018   Lab Results  Component Value Date   LDLCALC 222 (H) 11/19/2018   Lab Results  Component Value Date   TRIG 181.0 (H) 11/19/2018   Lab Results  Component Value Date   CHOLHDL 8 11/19/2018   Lab Results  Component Value Date   HGBA1C 6.2 11/19/2018       Assessment & Plan:   Problem List Items Addressed This Visit    Hyperlipidemia   Relevant Medications   rosuvastatin (CRESTOR) 10 MG tablet   Other Relevant Orders   CBC   CMP   Lipid panel   Coagulopathy ([place])    Is now following with with oncology again, uses a home meter and Dr Benay Spice manages the numbers. He recently had an endoscopy and had to stop coumadin for 3 days since they had to do a biopsy      Hypothyroid    Supplement and monitor      Relevant Orders   CBC   CMP   TSH   Vitamin D deficiency - Primary   Relevant Orders   CBC   VITAMIN D   Hyperglycemia    hgba1c acceptable, minimize simple carbs. Increase exercise as tolerated.       Relevant Orders   A1C   CMP      I have discontinued Maddux Aristizabal's tacrolimus and rosuvastatin. I am also having him start on rosuvastatin. Additionally, I am having  him maintain his b complex vitamins, Cholecalciferol, Coenzyme Q10 (COQ10 PO), cetirizine, NON FORMULARY, Cranberry, enoxaparin, thyroid, DULoxetine, Ascorbic Acid (VITAMIN C ER PO), warfarin, thyroid, and pantoprazole. We will continue to administer triamcinolone acetonide.  Meds ordered this encounter  Medications  . rosuvastatin (CRESTOR) 10 MG tablet    Sig: Take 1 tablet (10 mg total) by mouth daily.    Dispense:  90 tablet    Refill:  1     I discussed the assessment and treatment plan with the patient. The patient was provided an opportunity to ask  questions and all were answered. The patient agreed with the plan and demonstrated an understanding of the instructions.   The patient was advised to call back or seek an in-person evaluation if the symptoms worsen or if the condition fails to improve as anticipated.  I provided 25 minutes of non-face-to-face time during this encounter.   Penni Homans, MD

## 2019-02-14 NOTE — Assessment & Plan Note (Signed)
Supplement and monitor 

## 2019-02-16 ENCOUNTER — Other Ambulatory Visit: Payer: Self-pay | Admitting: Specialist

## 2019-02-16 DIAGNOSIS — G250 Essential tremor: Secondary | ICD-10-CM

## 2019-02-16 DIAGNOSIS — G459 Transient cerebral ischemic attack, unspecified: Secondary | ICD-10-CM

## 2019-02-16 DIAGNOSIS — D6859 Other primary thrombophilia: Secondary | ICD-10-CM

## 2019-02-16 DIAGNOSIS — H532 Diplopia: Secondary | ICD-10-CM

## 2019-02-26 ENCOUNTER — Encounter: Payer: Self-pay | Admitting: Oncology

## 2019-03-01 ENCOUNTER — Encounter: Payer: Self-pay | Admitting: *Deleted

## 2019-03-01 NOTE — Progress Notes (Signed)
Message to MD with recent INR and Coumadin dose per home monitoring.

## 2019-03-05 ENCOUNTER — Ambulatory Visit
Admission: RE | Admit: 2019-03-05 | Discharge: 2019-03-05 | Disposition: A | Discharge status: Home / Self Care | Payer: Medicare PPO | Source: Ambulatory Visit | Attending: Specialist | Admitting: Specialist

## 2019-03-05 ENCOUNTER — Other Ambulatory Visit: Payer: Self-pay

## 2019-03-05 DIAGNOSIS — G459 Transient cerebral ischemic attack, unspecified: Secondary | ICD-10-CM

## 2019-03-05 DIAGNOSIS — R413 Other amnesia: Secondary | ICD-10-CM | POA: Diagnosis not present

## 2019-03-05 DIAGNOSIS — H532 Diplopia: Secondary | ICD-10-CM

## 2019-03-05 DIAGNOSIS — D6859 Other primary thrombophilia: Secondary | ICD-10-CM

## 2019-03-05 DIAGNOSIS — G250 Essential tremor: Secondary | ICD-10-CM

## 2019-03-17 DIAGNOSIS — G250 Essential tremor: Secondary | ICD-10-CM | POA: Diagnosis not present

## 2019-03-17 DIAGNOSIS — H532 Diplopia: Secondary | ICD-10-CM | POA: Diagnosis not present

## 2019-03-17 DIAGNOSIS — G629 Polyneuropathy, unspecified: Secondary | ICD-10-CM | POA: Diagnosis not present

## 2019-03-17 DIAGNOSIS — D6859 Other primary thrombophilia: Secondary | ICD-10-CM | POA: Diagnosis not present

## 2019-03-17 DIAGNOSIS — R4189 Other symptoms and signs involving cognitive functions and awareness: Secondary | ICD-10-CM | POA: Diagnosis not present

## 2019-03-17 DIAGNOSIS — G459 Transient cerebral ischemic attack, unspecified: Secondary | ICD-10-CM | POA: Diagnosis not present

## 2019-03-23 ENCOUNTER — Encounter: Payer: Self-pay | Admitting: Oncology

## 2019-03-30 DIAGNOSIS — H532 Diplopia: Secondary | ICD-10-CM | POA: Diagnosis not present

## 2019-03-31 ENCOUNTER — Ambulatory Visit: Payer: Medicare Other

## 2019-04-03 DIAGNOSIS — G629 Polyneuropathy, unspecified: Secondary | ICD-10-CM | POA: Diagnosis not present

## 2019-04-03 DIAGNOSIS — D6859 Other primary thrombophilia: Secondary | ICD-10-CM | POA: Diagnosis not present

## 2019-04-03 DIAGNOSIS — G459 Transient cerebral ischemic attack, unspecified: Secondary | ICD-10-CM | POA: Diagnosis not present

## 2019-04-03 DIAGNOSIS — R4189 Other symptoms and signs involving cognitive functions and awareness: Secondary | ICD-10-CM | POA: Diagnosis not present

## 2019-04-08 ENCOUNTER — Ambulatory Visit: Payer: Medicare PPO | Attending: Internal Medicine

## 2019-04-08 DIAGNOSIS — I672 Cerebral atherosclerosis: Secondary | ICD-10-CM | POA: Diagnosis not present

## 2019-04-08 DIAGNOSIS — G4733 Obstructive sleep apnea (adult) (pediatric): Secondary | ICD-10-CM | POA: Diagnosis not present

## 2019-04-08 DIAGNOSIS — E039 Hypothyroidism, unspecified: Secondary | ICD-10-CM | POA: Diagnosis not present

## 2019-04-08 DIAGNOSIS — Z23 Encounter for immunization: Secondary | ICD-10-CM

## 2019-04-08 NOTE — Progress Notes (Signed)
   Covid-19 Vaccination Clinic  Name:  Paul Ryan    MRN: [id number] DOB: 1945-10-29  04/08/2019  Mr. Show was observed post Covid-19 immunization for 15 minutes without incidence. He was provided with Vaccine Information Sheet and instruction to access the V-Safe system.   [name] was instructed to call 911 with any severe reactions post vaccine: [name] Kitchen Difficulty breathing  . Swelling of your face and throat  . A fast heartbeat  . A bad rash all over your body  . Dizziness and weakness    Immunizations Administered    Name Date Dose VIS Date Route   Pfizer COVID-19 Vaccine 04/08/2019  4:02 PM 0.3 mL 02/11/2019 Intramuscular   Manufacturer: [place]   Lot: [id number]   [place]: [id number]

## 2019-04-19 ENCOUNTER — Encounter: Payer: Self-pay | Admitting: Oncology

## 2019-04-21 ENCOUNTER — Ambulatory Visit: Payer: Medicare Other

## 2019-04-28 ENCOUNTER — Encounter: Payer: Self-pay | Admitting: Family Medicine

## 2019-04-28 ENCOUNTER — Other Ambulatory Visit: Payer: Self-pay | Admitting: Family Medicine

## 2019-04-28 DIAGNOSIS — N342 Other urethritis: Secondary | ICD-10-CM

## 2019-05-01 DIAGNOSIS — G629 Polyneuropathy, unspecified: Secondary | ICD-10-CM | POA: Diagnosis not present

## 2019-05-01 DIAGNOSIS — D6859 Other primary thrombophilia: Secondary | ICD-10-CM | POA: Diagnosis not present

## 2019-05-01 DIAGNOSIS — G459 Transient cerebral ischemic attack, unspecified: Secondary | ICD-10-CM | POA: Diagnosis not present

## 2019-05-01 DIAGNOSIS — R4189 Other symptoms and signs involving cognitive functions and awareness: Secondary | ICD-10-CM | POA: Diagnosis not present

## 2019-05-02 ENCOUNTER — Ambulatory Visit: Payer: Medicare PPO | Attending: Internal Medicine

## 2019-05-02 DIAGNOSIS — Z1212 Encounter for screening for malignant neoplasm of rectum: Secondary | ICD-10-CM | POA: Diagnosis not present

## 2019-05-02 DIAGNOSIS — R1310 Dysphagia, unspecified: Secondary | ICD-10-CM | POA: Diagnosis not present

## 2019-05-02 DIAGNOSIS — Z23 Encounter for immunization: Secondary | ICD-10-CM | POA: Insufficient documentation

## 2019-05-02 DIAGNOSIS — Z1211 Encounter for screening for malignant neoplasm of colon: Secondary | ICD-10-CM | POA: Diagnosis not present

## 2019-05-02 DIAGNOSIS — K2100 Gastro-esophageal reflux disease with esophagitis, without bleeding: Secondary | ICD-10-CM | POA: Diagnosis not present

## 2019-05-02 NOTE — Progress Notes (Signed)
   Covid-19 Vaccination Clinic  Name:  Paul Ryan    MRN: [id number] DOB: 05-31-1945  05/02/2019  [name] was observed post Covid-19 immunization for 15 minutes without incidence. He was provided with Vaccine Information Sheet and instruction to access the V-Safe system.   [name] was instructed to call 911 with any severe reactions post vaccine: [name] Kitchen Difficulty breathing  . Swelling of your face and throat  . A fast heartbeat  . A bad rash all over your body  . Dizziness and weakness    Immunizations Administered    Name Date Dose VIS Date Route   Pfizer COVID-19 Vaccine 05/02/2019  3:06 PM 0.3 mL 02/11/2019 Intramuscular   Manufacturer: [place]   Lot: [id number]   [place]: [id number]

## 2019-05-04 ENCOUNTER — Ambulatory Visit: Payer: Medicare PPO

## 2019-05-08 ENCOUNTER — Other Ambulatory Visit: Payer: Self-pay | Admitting: Family Medicine

## 2019-05-10 DIAGNOSIS — I2699 Other pulmonary embolism without acute cor pulmonale: Secondary | ICD-10-CM | POA: Diagnosis not present

## 2019-05-10 DIAGNOSIS — D6859 Other primary thrombophilia: Secondary | ICD-10-CM | POA: Diagnosis not present

## 2019-05-10 DIAGNOSIS — Z7901 Long term (current) use of anticoagulants: Secondary | ICD-10-CM | POA: Diagnosis not present

## 2019-05-12 ENCOUNTER — Other Ambulatory Visit: Payer: Medicare PPO

## 2019-05-12 ENCOUNTER — Other Ambulatory Visit (HOSPITAL_COMMUNITY)
Admission: RE | Admit: 2019-05-12 | Discharge: 2019-05-12 | Disposition: A | Discharge status: Home / Self Care | Payer: Medicare PPO | Source: Ambulatory Visit | Attending: Family Medicine | Admitting: Family Medicine

## 2019-05-12 ENCOUNTER — Other Ambulatory Visit: Payer: Self-pay

## 2019-05-12 ENCOUNTER — Other Ambulatory Visit (INDEPENDENT_AMBULATORY_CARE_PROVIDER_SITE_OTHER): Payer: Medicare PPO

## 2019-05-12 DIAGNOSIS — E785 Hyperlipidemia, unspecified: Secondary | ICD-10-CM | POA: Diagnosis not present

## 2019-05-12 DIAGNOSIS — R739 Hyperglycemia, unspecified: Secondary | ICD-10-CM | POA: Diagnosis not present

## 2019-05-12 DIAGNOSIS — E039 Hypothyroidism, unspecified: Secondary | ICD-10-CM

## 2019-05-12 DIAGNOSIS — N342 Other urethritis: Secondary | ICD-10-CM | POA: Diagnosis not present

## 2019-05-12 DIAGNOSIS — E559 Vitamin D deficiency, unspecified: Secondary | ICD-10-CM

## 2019-05-12 LAB — CBC
HCT: 43.9 % (ref 39.0–52.0)
Hemoglobin: 14.6 g/dL (ref 13.0–17.0)
MCHC: 33.2 g/dL (ref 30.0–36.0)
MCV: 88.7 fl (ref 78.0–100.0)
Platelets: 233 10*3/uL (ref 150.0–400.0)
RBC: 4.94 Mil/uL (ref 4.22–5.81)
RDW: 14.5 % (ref 11.5–15.5)
WBC: 6.8 10*3/uL (ref 4.0–10.5)

## 2019-05-12 LAB — LIPID PANEL
Cholesterol: 176 mg/dL (ref 0–200)
HDL: 37.8 mg/dL — ABNORMAL LOW (ref >39.00)
LDL Cholesterol: 123 mg/dL — ABNORMAL HIGH (ref 0–99)
NonHDL: 138.43
Total CHOL/HDL Ratio: 5
Triglycerides: 78 mg/dL (ref 0.0–149.0)
VLDL: 15.6 mg/dL (ref 0.0–40.0)

## 2019-05-12 LAB — COMPREHENSIVE METABOLIC PANEL
ALT: 26 U/L (ref 0–53)
AST: 31 U/L (ref 0–37)
Albumin: 4 g/dL (ref 3.5–5.2)
Alkaline Phosphatase: 69 U/L (ref 39–117)
BUN: 13 mg/dL (ref 6–23)
CO2: 28 mEq/L (ref 19–32)
Calcium: 9.1 mg/dL (ref 8.4–10.5)
Chloride: 103 mEq/L (ref 96–112)
Creatinine, Ser: 1.08 mg/dL (ref 0.40–1.50)
GFR: 66.71 mL/min (ref >60.00)
Glucose, Bld: 101 mg/dL — ABNORMAL HIGH (ref 70–99)
Potassium: 4.6 mEq/L (ref 3.5–5.1)
Sodium: 136 mEq/L (ref 135–145)
Total Bilirubin: 1.1 mg/dL (ref 0.2–1.2)
Total Protein: 7 g/dL (ref 6.0–8.3)

## 2019-05-12 LAB — VITAMIN D 25 HYDROXY (VIT D DEFICIENCY, FRACTURES): VITD: 81.17 ng/mL (ref 30.00–100.00)

## 2019-05-12 LAB — HEMOGLOBIN A1C: Hgb A1c MFr Bld: 6.5 % (ref 4.6–6.5)

## 2019-05-12 LAB — TSH: TSH: 0.27 u[IU]/mL — ABNORMAL LOW (ref 0.35–4.50)

## 2019-05-13 ENCOUNTER — Encounter: Payer: Self-pay | Admitting: Family Medicine

## 2019-05-13 MED ORDER — ROSUVASTATIN CALCIUM 10 MG PO TABS: 10.0000 mg | ORAL_TABLET | Freq: Every day | ORAL | 1 refills | Status: DC

## 2019-05-15 ENCOUNTER — Encounter: Payer: Self-pay | Admitting: Oncology

## 2019-05-16 ENCOUNTER — Telehealth: Payer: Self-pay

## 2019-05-16 LAB — URINE CYTOLOGY ANCILLARY ONLY
Bacterial Vaginitis-Urine: NEGATIVE
Candida Urine: NEGATIVE
Chlamydia: NEGATIVE
Comment: NEGATIVE
Comment: NEGATIVE
Comment: NORMAL
Neisseria Gonorrhea: NEGATIVE
Trichomonas: NEGATIVE

## 2019-05-16 NOTE — Telephone Encounter (Signed)
Patient scheduled later this week.

## 2019-05-16 NOTE — Telephone Encounter (Signed)
Patient called and stated he would like a call back form Dr. Tamala Julian or his assistant, did not want to relay any other information.

## 2019-05-19 ENCOUNTER — Ambulatory Visit: Payer: Medicare PPO | Admitting: Family Medicine

## 2019-05-19 ENCOUNTER — Ambulatory Visit (INDEPENDENT_AMBULATORY_CARE_PROVIDER_SITE_OTHER): Payer: Medicare PPO | Admitting: Family Medicine

## 2019-05-19 ENCOUNTER — Other Ambulatory Visit: Payer: Self-pay

## 2019-05-19 ENCOUNTER — Ambulatory Visit: Payer: Self-pay

## 2019-05-19 ENCOUNTER — Encounter: Payer: Self-pay | Admitting: Family Medicine

## 2019-05-19 VITALS — BP 110/82 | HR 68 | Ht 72.0 in | Wt 175.0 lb

## 2019-05-19 DIAGNOSIS — M7021 Olecranon bursitis, right elbow: Secondary | ICD-10-CM

## 2019-05-19 DIAGNOSIS — R739 Hyperglycemia, unspecified: Secondary | ICD-10-CM

## 2019-05-19 DIAGNOSIS — E559 Vitamin D deficiency, unspecified: Secondary | ICD-10-CM

## 2019-05-19 DIAGNOSIS — M25521 Pain in right elbow: Secondary | ICD-10-CM

## 2019-05-19 DIAGNOSIS — I2782 Chronic pulmonary embolism: Secondary | ICD-10-CM

## 2019-05-19 DIAGNOSIS — E785 Hyperlipidemia, unspecified: Secondary | ICD-10-CM | POA: Diagnosis not present

## 2019-05-19 DIAGNOSIS — E039 Hypothyroidism, unspecified: Secondary | ICD-10-CM

## 2019-05-19 NOTE — Assessment & Plan Note (Signed)
Right-sided olecranon bursitis.  No significant signs of any type of infectious etiology and patient states that over the last 3 weeks seems to be improving.  Discussed avoiding contact in this area, compression sleeve, topical anti-inflammatories, worsening symptoms will consider the possibility of aspiration.  Patient is in agreement with the plan.  Follow-up with me again 5 weeks

## 2019-05-19 NOTE — Progress Notes (Signed)
Virtual Visit via phone Note  I connected with Paul Ryan on 05/19/19 at  2:40 PM EDT by a phone enabled telemedicine application and verified that I am speaking with the correct person using two identifiers.  Location: Patient: home Provider: home   I discussed the limitations of evaluation and management by telemedicine and the availability of in person appointments. The patient expressed understanding and agreed to proceed. Marin Roberts CMA was able to set the patient up on a visit, phone after being unable to set up a video visit   Subjective:    Patient ID: Paul Ryan, male    DOB: 22-Jun-1945, 74 y.o.   MRN: [id number]  Chief Complaint  Patient presents with  . Hyperlipidemia    follow up  . Vitamin D deficiency    HPI Patient is in today for follow up on chronic medical concerns. He feels well. No recent febrile illness or hospitalizations. His INRs have been stable he has had and tolerated his COVID shots and will soon get his second Shingrix shot. They are trying to stay active and maintain a heart healthy diet. Denies CP/palp/SOB/HA/congestion/fevers/GI or GU c/o. Taking meds as prescribed Past Medical History:  Diagnosis Date  . Acid reflux 02/03/2016  . Allergy    seasonal  . Anxiety 12/01/2016  . Chronic anticoagulation 06/14/2013  . Coagulopathy ([place]) 05/16/2011  . Collagen vascular disease ([place])   . Cough 12/01/2016  . DVT (deep venous thrombosis) ([name])    takes coumadin  . Dysuria 02/27/2013  . Erectile dysfunction 12/28/2016  . 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 Hyperglycemia 12/26/2014  . Hyperlipidemia   . Hypothyroid 06/10/2011  . Insomnia 04/24/2016  . Left hip pain 12/17/2010  . MCI (mild cognitive impairment) 07/22/2016  . Nasal lesion 07/22/2016  . Polymyalgia ([facility]) 03/21/2014  . 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 Preventative health care 12/17/2010  . Shingles 1982   right abdominal wall  . Staph skin infection 07/22/2016  .  Thyroid nodule 11/29/2014  . Urinary hesitancy 09/30/2016  . Vitamin D deficiency 10/14/2011    Past Surgical History:  Procedure Laterality Date  . 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74 yrs old   4 surgeries    Family History  Problem Relation Age of Onset  . Cancer Mother        unknown- everywhere  . Cancer Sister        breast    Social History   Socioeconomic History  . Marital status: Married    Spouse name: Not on file  . Number of children: Not on file  . Years of education: Not on file  . Highest education level: Not on file  Occupational History  . Not on file  Tobacco Use  . Smoking status: Former Smoker    Packs/day: 1.00    Years: 20.00    Pack years: 20.00    Types: Cigarettes    Quit date: 03/03/1980    Years since quitting: 39.2  . Smokeless tobacco: Never Used  Substance and Sexual Activity  . Alcohol use: No    Alcohol/week: 0.0 standard drinks  . Drug use: No  . Sexual activity: Not on file  Other Topics Concern  . Not on file  Social History Narrative  . Not on file   Social Determinants of Health   Financial Resource Strain:   . Difficulty of Paying Living Expenses:   Food Insecurity:   . Worried About Charity fundraiser in  the Last Year:   . [place] in the Last Year:   Transportation Needs:   . Film/video editor (Medical):   [name] Kitchen Lack of Transportation (Non-Medical):   Physical Activity:   . Days of Exercise per Week:   . Minutes of Exercise per Session:   Stress:   . Feeling of Stress :   Social Connections:   . Frequency of Communication with Friends and Family:   . Frequency of Social Gatherings with Friends and Family:   . Attends Religious Services:   . Active Member of Clubs or Organizations:   . Attends Archivist Meetings:   [name] Kitchen Marital Status:   Intimate Partner Violence:   . Fear of Current or Ex-Partner:   . Emotionally Abused:   [name] Kitchen Physically Abused:   . Sexually Abused:     Outpatient Medications  Prior to Visit  Medication Sig Dispense Refill  . Ascorbic Acid (VITAMIN C ER PO) Take by mouth daily.    [name] Kitchen b complex vitamins tablet Take 1 tablet by mouth daily.      . cetirizine (ZYRTEC) 10 MG tablet TAKE 1 TABLET BY MOUTH EVERY DAY AS NEEDED FOR ALLERGIES 30 tablet 4  . Cholecalciferol 4000 UNITS TABS Take 1 tablet by mouth daily.    . Coenzyme Q10 (COQ10 PO) Take 2 tablets by mouth daily.    . Cranberry 250 MG TABS Take 1 tablet by mouth daily.    . DULoxetine (CYMBALTA) 30 MG capsule Take 30 mg by mouth 2 (two) times daily.    [name] Kitchen enoxaparin (LOVENOX) 120 MG/0.8ML injection Inject 0.8 mLs (120 mg total) into the skin daily. Use only if INR < 2.0 or as instructed, discontinue when INR 2.0-3.0 30 Syringe 3  . NON FORMULARY AHCC immune support    . pantoprazole (PROTONIX) 40 MG tablet Take 40 mg by mouth daily.    . rosuvastatin (CRESTOR) 10 MG tablet Take 1 tablet (10 mg total) by mouth daily. 90 tablet 1  . thyroid (ARMOUR THYROID) 60 MG tablet TAKE ONE TABLET BY MOUTH ON MONDAY, WEDNESDAY, THURSDAY, FRIDAY, AND SUNDAY. 65 tablet 0  . thyroid (ARMOUR THYROID) 90 MG tablet TAKE 1 TABLET BY MOUTH DAILY ON TUESDAY AND SATURDAY 24 tablet 2  . warfarin (COUMADIN) 4 MG tablet Take 1 tablet (4 mg total) by mouth daily. Adjust dose as instructed (Patient taking differently: Take 4 mg by mouth daily. Adjust dose as instructed--4mg  qd and 2mg  Tue/Fri) 90 tablet 3   Facility-Administered Medications Prior to Visit  Medication Dose Route Frequency Provider Last Rate Last Admin  . triamcinolone acetonide (KENALOG) 10 MG/ML injection 10 mg  10 mg Other Once Trula Slade, DPM        No Known Allergies  Review of Systems  Constitutional: Negative for fever and malaise/fatigue.  HENT: Negative for congestion.   Eyes: Negative for blurred vision.  Respiratory: Negative for shortness of breath.   Cardiovascular: Negative for chest pain, palpitations and leg swelling.  Gastrointestinal: Negative  for abdominal pain, blood in stool and nausea.  Genitourinary: Negative for dysuria and frequency.  Musculoskeletal: Negative for falls.  Skin: Negative for rash.  Neurological: Negative for dizziness, loss of consciousness and headaches.  Endo/Heme/Allergies: Negative for environmental allergies.  Psychiatric/Behavioral: Negative for depression. The patient is not nervous/anxious.        Objective:    Physical Exam unable to obtain via phone visit  There were no vitals taken for this visit. Wt  Readings from Last 3 Encounters:  05/19/19 175 lb (79.4 kg)  02/11/19 165 lb (74.8 kg)  09/16/18 164 lb 9 oz (74.6 kg)    Diabetic Foot Exam - Simple   No data filed     Lab Results  Component Value Date   WBC 6.8 05/12/2019   HGB 14.6 05/12/2019   HCT 43.9 05/12/2019   PLT 233.0 05/12/2019   GLUCOSE 101 (H) 05/12/2019   CHOL 176 05/12/2019   TRIG 78.0 05/12/2019   HDL 37.80 (L) 05/12/2019   LDLDIRECT 158.0 03/11/2013   LDLCALC 123 (H) 05/12/2019   ALT 26 05/12/2019   AST 31 05/12/2019   NA 136 05/12/2019   K 4.6 05/12/2019   CL 103 05/12/2019   CREATININE 1.08 05/12/2019   BUN 13 05/12/2019   CO2 28 05/12/2019   TSH 0.27 (L) 05/12/2019   PSA 1.53 07/18/2016   INR 3.5 (A) 09/22/2018   HGBA1C 6.5 05/12/2019    Lab Results  Component Value Date   TSH 0.27 (L) 05/12/2019   Lab Results  Component Value Date   WBC 6.8 05/12/2019   HGB 14.6 05/12/2019   HCT 43.9 05/12/2019   MCV 88.7 05/12/2019   PLT 233.0 05/12/2019   Lab Results  Component Value Date   NA 136 05/12/2019   K 4.6 05/12/2019   CHLORIDE 109 11/16/2012   CO2 28 05/12/2019   GLUCOSE 101 (H) 05/12/2019   BUN 13 05/12/2019   CREATININE 1.08 05/12/2019   BILITOT 1.1 05/12/2019   ALKPHOS 69 05/12/2019   AST 31 05/12/2019   ALT 26 05/12/2019   PROT 7.0 05/12/2019   ALBUMIN 4.0 05/12/2019   CALCIUM 9.1 05/12/2019   ANIONGAP 8 09/11/2014   GFR 66.71 05/12/2019   Lab Results  Component Value  Date   CHOL 176 05/12/2019   Lab Results  Component Value Date   HDL 37.80 (L) 05/12/2019   Lab Results  Component Value Date   LDLCALC 123 (H) 05/12/2019   Lab Results  Component Value Date   TRIG 78.0 05/12/2019   Lab Results  Component Value Date   CHOLHDL 5 05/12/2019   Lab Results  Component Value Date   HGBA1C 6.5 05/12/2019       Assessment & Plan:   Problem List Items Addressed This Visit    Hyperlipidemia    Tolerating statin, encouraged heart healthy diet, avoid trans fats, minimize simple carbs and saturated fats. Increase exercise as tolerated      Hypothyroid    On [place] thyroid, continue to monitor. TSH mildly suppressed. But patient asymptomatic will recheck in 3 months before making changes      Vitamin D deficiency    Supplement and monitor, he has been taking weekly 50000 iu and 2000 iu every other day.      Pulmonary embolism ([name])    Dr Benay Spice of oncology continues to monitor his INR and he texts them his results after checking at home.      Hyperglycemia    hgba1c acceptable, minimize simple carbs. Increase exercise as tolerated.          I am having Unknown Foley maintain his b complex vitamins, Cholecalciferol, Coenzyme Q10 (COQ10 PO), cetirizine, NON FORMULARY, Cranberry, enoxaparin, thyroid, DULoxetine, Ascorbic Acid (VITAMIN C ER PO), warfarin, pantoprazole, thyroid, and rosuvastatin. We will continue to administer triamcinolone acetonide.  No orders of the defined types were placed in this encounter.    I discussed the assessment and treatment plan with  the patient. The patient was provided an opportunity to ask questions and all were answered. The patient agreed with the plan and demonstrated an understanding of the instructions.   The patient was advised to call back or seek an in-person evaluation if the symptoms worsen or if the condition fails to improve as anticipated.  I provided 25 minutes of non-face-to-face time during  this encounter.   Penni Homans, MD

## 2019-05-19 NOTE — Assessment & Plan Note (Signed)
Tolerating statin, encouraged heart healthy diet, avoid trans fats, minimize simple carbs and saturated fats. Increase exercise as tolerated 

## 2019-05-19 NOTE — Assessment & Plan Note (Addendum)
On [place] thyroid, continue to monitor. TSH mildly suppressed. But patient asymptomatic will recheck in 3 months before making changes

## 2019-05-19 NOTE — Patient Instructions (Signed)
Compression sleeve Avoid hard surfaces See me in 5 weeks if not better will drain

## 2019-05-19 NOTE — Progress Notes (Signed)
[place] Phone: [phone] Subjective:   [name], am serving as a scribe for Dr. Hulan Saas. This visit occurred during the SARS-CoV-2 public health emergency.  Safety protocols were in place, including screening questions prior to the visit, additional usage of staff PPE, and extensive cleaning of exam room while observing appropriate contact time as indicated for disinfecting solutions.   I'm seeing this patient by the request  of:  [name], MD  CC: Right elbow pain and swelling  [id number]  Paul Ryan is a 74 y.o. male coming in with complaint of elbow pain. Last seen in 2017. Patient states that he has had pain in the elbow but has more of a persistent swelling that has been going on for 3 weeks.  Patient does not remember any true injury, denies any fevers chills or any abnormal weight loss.  States that the only reason he is here is secondary to his wife showing him that it showed some enlargement.       Past Medical History:  Diagnosis Date  . Acid reflux 02/03/2016  . Allergy    seasonal  . Anxiety 12/01/2016  . Chronic anticoagulation 06/14/2013  . Coagulopathy ([place]) 05/16/2011  . Collagen vascular disease ([place])   . Cough 12/01/2016  . DVT (deep venous thrombosis) ([name])    takes coumadin  . Dysuria 02/27/2013  . Erectile dysfunction 12/28/2016  . 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 Hyperglycemia 12/26/2014  . Hyperlipidemia   . Hypothyroid 06/10/2011  . Insomnia 04/24/2016  . Left hip pain 12/17/2010  . MCI (mild cognitive impairment) 07/22/2016  . Nasal lesion 07/22/2016  . Polymyalgia ([place]) 03/21/2014  . 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 Preventative health care 12/17/2010  . Shingles 1982   right abdominal wall  . Staph skin infection 07/22/2016  . Thyroid nodule 11/29/2014  . Urinary hesitancy 09/30/2016  . Vitamin D deficiency 10/14/2011   Past  Surgical History:  Procedure Laterality Date  . 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 Marital status: Married    Spouse name: Not on file  . Number of children: Not on file  . Years of education: Not on file  . Highest education level: Not on file  Occupational History  . Not on file  Tobacco Use  . Smoking status: Former Smoker    Packs/day: 1.00    Years: 20.00    Pack years: 20.00    Types: Cigarettes    Quit date: 03/03/1980    Years since quitting: 39.2  . Smokeless tobacco: Never Used  Substance and Sexual Activity  . Alcohol use: No    Alcohol/week: 0.0 standard drinks  . Drug use: No  . Sexual activity: Not on file  Other Topics Concern  . Not on file  Social History Narrative  . Not on file   Social Determinants of Health   Financial Resource Strain:   . Difficulty of Paying Living Expenses:   Food Insecurity:   . Worried About Charity fundraiser in the Last Year:   . Arboriculturist in the Last Year:   Transportation Needs:   . Film/video editor (Medical):   [name] Kitchen Lack of Transportation (Non-Medical):   Physical Activity:   . Days of Exercise per Week:   . Minutes of Exercise per Session:  Stress:   . Feeling of Stress :   Social Connections:   . Frequency of Communication with Friends and Family:   . Frequency of Social Gatherings with Friends and Family:   . Attends Religious Services:   . Active Member of Clubs or Organizations:   . Attends Archivist Meetings:   [name] Kitchen Marital Status:    No Known Allergies Family History  Problem Relation Age of Onset  . Cancer Mother        unknown- everywhere  . Cancer Sister        breast    Current Outpatient Medications (Endocrine & Metabolic):  .  thyroid (ARMOUR THYROID) 60 MG tablet, TAKE ONE TABLET BY MOUTH ON MONDAY, WEDNESDAY, THURSDAY, FRIDAY, AND SUNDAY. [name] Kitchen  thyroid (ARMOUR THYROID) 90 MG tablet, TAKE 1 TABLET BY MOUTH DAILY  ON TUESDAY AND SATURDAY  Current Facility-Administered Medications (Endocrine & Metabolic):  .  triamcinolone acetonide (KENALOG) 10 MG/ML injection 10 mg  Current Outpatient Medications (Cardiovascular):  .  rosuvastatin (CRESTOR) 10 MG tablet, Take 1 tablet (10 mg total) by mouth daily.   Current Outpatient Medications (Respiratory):  .  cetirizine (ZYRTEC) 10 MG tablet, TAKE 1 TABLET BY MOUTH EVERY DAY AS NEEDED FOR ALLERGIES     Current Outpatient Medications (Hematological):  .  enoxaparin (LOVENOX) 120 MG/0.8ML injection, Inject 0.8 mLs (120 mg total) into the skin daily. Use only if INR < 2.0 or as instructed, discontinue when INR 2.0-3.0 .  warfarin (COUMADIN) 4 MG tablet, Take 1 tablet (4 mg total) by mouth daily. Adjust dose as instructed (Patient taking differently: Take 4 mg by mouth daily. Adjust dose as instructed--4mg  qd and 2mg  Tue/Fri)   Current Outpatient Medications (Other):  [name] Kitchen  Ascorbic Acid (VITAMIN C ER PO), Take by mouth daily. [name] Kitchen  b complex vitamins tablet, Take 1 tablet by mouth daily.   .  Cholecalciferol 4000 UNITS TABS, Take 1 tablet by mouth daily. .  Coenzyme Q10 (COQ10 PO), Take 2 tablets by mouth daily. .  Cranberry 250 MG TABS, Take 1 tablet by mouth daily. .  DULoxetine (CYMBALTA) 30 MG capsule, Take 30 mg by mouth 2 (two) times daily. .  NON FORMULARY, AHCC immune support .  pantoprazole (PROTONIX) 40 MG tablet, Take 40 mg by mouth daily.    Reviewed prior external information including notes and imaging from  primary care provider As well as notes that were available from care everywhere and other healthcare systems.  Past medical history, social, surgical and family history all reviewed in electronic medical record.  No pertanent information unless stated regarding to the chief complaint.   Review of Systems:  No headache, visual changes, nausea, vomiting, diarrhea, constipation, dizziness, abdominal pain, skin rash, fevers, chills, night  sweats, weight loss, swollen lymph nodes, body aches, joint swelling, chest pain, shortness of breath, mood changes. POSITIVE muscle aches  Objective  Blood pressure 110/82, pulse 68, height 6' (1.829 m), weight 175 lb (79.4 kg).   General: No apparent distress alert and oriented x3 mood and affect normal, dressed appropriately.  HEENT: Pupils equal, extraocular movements intact  Respiratory: Patient's speak in full sentences and does not appear short of breath  Cardiovascular: No lower extremity edema, non tender, no erythema  Skin: Warm dry intact with no signs of infection or rash on extremities or on axial skeleton.  Abdomen: Soft nontender  Neuro: Cranial nerves II through XII are intact, neurovascularly intact in all extremities with 2+ DTRs and 2+ pulses.  Lymph: No lymphadenopathy of posterior or anterior cervical chain or axillae bilaterally.  Gait normal with good balance and coordination.  MSK:   patient does have some missing fingers Right elbow exam has full range of motion.  Patient does have some swelling over the olecranon bursa, freely movable, nontender, no overlying erythema of the skin.  Good grip strength.  Neurovascular intact distally.  Limited musculoskeletal ultrasound was performed and interpreted by Lyndal Pulley  Limited ultrasound shows the patient does have an olecranon bursa that is fluid-filled, very minimal increase in Doppler flow surrounding the area but no sign of any infectious soft tissue.  Patient does have some chronic thickening of the capsule of the bursa noted. Impression: Olecranon bursitis, chronic   Impression and Recommendations:     The above documentation has been reviewed and is accurate and complete Lyndal Pulley, DO       Note: This dictation was prepared with Dragon dictation along with smaller phrase technology. Any transcriptional errors that result from this process are unintentional.

## 2019-05-19 NOTE — Assessment & Plan Note (Addendum)
Supplement and monitor, he has been taking weekly 50000 iu and 2000 iu every other day.

## 2019-05-19 NOTE — Assessment & Plan Note (Signed)
Dr Benay Spice of oncology continues to monitor his INR and he texts them his results after checking at home.

## 2019-05-19 NOTE — Patient Instructions (Signed)
[employer] Blood Pressure cuff, upper arm, want BP 100-140/60-90 Pulse oximeter, want oxygen in 90s  Weekly vitals  Take Multivitamin with minerals, selenium Vitamin D 1000-2000 IU daily Probiotic with lactobacillus and bifidophilus  Fish oil or Krill oil  Melatonin 2-5 mg at bedtime  [email or web address]

## 2019-05-19 NOTE — Assessment & Plan Note (Signed)
hgba1c acceptable, minimize simple carbs. Increase exercise as tolerated.  

## 2019-05-23 ENCOUNTER — Encounter: Payer: Self-pay | Admitting: Oncology

## 2019-05-24 LAB — PROTIME-INR: INR: 2.9 — AB (ref 0.9–1.1)

## 2019-06-01 DIAGNOSIS — R4189 Other symptoms and signs involving cognitive functions and awareness: Secondary | ICD-10-CM | POA: Diagnosis not present

## 2019-06-01 DIAGNOSIS — G459 Transient cerebral ischemic attack, unspecified: Secondary | ICD-10-CM | POA: Diagnosis not present

## 2019-06-01 DIAGNOSIS — G629 Polyneuropathy, unspecified: Secondary | ICD-10-CM | POA: Diagnosis not present

## 2019-06-01 DIAGNOSIS — D6859 Other primary thrombophilia: Secondary | ICD-10-CM | POA: Diagnosis not present

## 2019-06-13 ENCOUNTER — Encounter: Payer: Self-pay | Admitting: *Deleted

## 2019-06-13 ENCOUNTER — Encounter: Payer: Self-pay | Admitting: Oncology

## 2019-06-13 NOTE — Progress Notes (Signed)
Received VM and fax from company to renew his INR device and orders. Form completed, signed by MD and faxed back to [phone]

## 2019-06-20 ENCOUNTER — Other Ambulatory Visit: Payer: Self-pay

## 2019-06-20 ENCOUNTER — Inpatient Hospital Stay: Payer: Medicare PPO | Attending: Oncology | Admitting: Oncology

## 2019-06-20 VITALS — BP 128/79 | HR 69 | Temp 98.0°F | Resp 16 | Ht 72.0 in | Wt 172.0 lb

## 2019-06-20 DIAGNOSIS — I82509 Chronic embolism and thrombosis of unspecified deep veins of unspecified lower extremity: Secondary | ICD-10-CM

## 2019-06-20 DIAGNOSIS — Z8615 Personal history of latent tuberculosis infection: Secondary | ICD-10-CM | POA: Diagnosis not present

## 2019-06-20 DIAGNOSIS — Z86718 Personal history of other venous thrombosis and embolism: Secondary | ICD-10-CM | POA: Diagnosis not present

## 2019-06-20 DIAGNOSIS — R200 Anesthesia of skin: Secondary | ICD-10-CM | POA: Diagnosis not present

## 2019-06-20 DIAGNOSIS — Z7901 Long term (current) use of anticoagulants: Secondary | ICD-10-CM | POA: Diagnosis not present

## 2019-06-20 DIAGNOSIS — D6859 Other primary thrombophilia: Secondary | ICD-10-CM | POA: Diagnosis not present

## 2019-06-20 DIAGNOSIS — E039 Hypothyroidism, unspecified: Secondary | ICD-10-CM | POA: Diagnosis not present

## 2019-06-20 DIAGNOSIS — Z86711 Personal history of pulmonary embolism: Secondary | ICD-10-CM | POA: Diagnosis not present

## 2019-06-20 NOTE — Progress Notes (Signed)
  [facility] OFFICE PROGRESS NOTE   Diagnosis: Hypercoagulation syndrome  INTERVAL HISTORY:   Paul Ryan returns as scheduled.  He continues Coumadin anticoagulation.  He monitors the INR at home and reports the last reading returned at 2.9. He continues to have numbness in the feet.  He complains of stumbling intermittently.  He has noted difficulty lifting the right foot and leg when going up stairs.  He has seen a neurologist in the past.  Objective:  Vital signs in last 24 hours:  Blood pressure 128/79, pulse 69, temperature 98 F (36.7 C), temperature source Temporal, resp. rate 16, height 6' (1.829 m), weight 172 lb (78 kg), SpO2 98 %.    Vascular: No leg edema, varicosities at the right greater than left lower leg Neuro: The motor exam appears intact in the legs and feet bilaterally.  The deep tendon reflexes are 2+ at the knees bilaterally.     Lab Results:  Lab Results  Component Value Date   WBC 6.8 05/12/2019   HGB 14.6 05/12/2019   HCT 43.9 05/12/2019   MCV 88.7 05/12/2019   PLT 233.0 05/12/2019   NEUTROABS 4.6 04/16/2017    CMP  Lab Results  Component Value Date   NA 136 05/12/2019   K 4.6 05/12/2019   CL 103 05/12/2019   CO2 28 05/12/2019   GLUCOSE 101 (H) 05/12/2019   BUN 13 05/12/2019   CREATININE 1.08 05/12/2019   CALCIUM 9.1 05/12/2019   PROT 7.0 05/12/2019   ALBUMIN 4.0 05/12/2019   AST 31 05/12/2019   ALT 26 05/12/2019   ALKPHOS 69 05/12/2019   BILITOT 1.1 05/12/2019   GFRNONAA 50 (L) 09/11/2014   GFRAA 58 (L) 09/11/2014    PT/INR 2.9 on 05/24/2019 Medications: I have reviewed the patient's current medications.   Assessment/Plan: 1. Protein S deficiency  Right lower extremity deep vein thrombosis, unprovoked, June 2010 treated with 1 year of Coumadin anticoagulation  Placed back on Coumadin after protein S level returned low while off of Coumadin  Anticoagulation changed to Xarelto April 2015  Pulmonary embolism  08/07/2014- anticoagulation changed back to Coumadin  2.  History of latent tuberculosis treated with [name] in 2017 3.  Hypothyroidism 4.  History of collagen vascular disease-unspecified 5.  Right abdominal wall zoster 1982     Disposition: Paul Ryan continues Coumadin anticoagulation.  He monitors the INR at home and send results to [place].  I recommended he follow-up with his neurologist to evaluate the foot numbness and stumbling.  He will return for an office visit in 1 year.  He will contact us in the interim for new symptoms.  He knows to contact us if he is undergoing a medical procedure.  Betsy Coder, MD  06/20/2019  12:02 PM

## 2019-06-21 DIAGNOSIS — H43813 Vitreous degeneration, bilateral: Secondary | ICD-10-CM | POA: Diagnosis not present

## 2019-06-22 NOTE — Progress Notes (Signed)
Paul Ryan Phone: [phone] Subjective:   [name], am serving as a scribe for Dr. Hulan Saas. This visit occurred during the SARS-CoV-2 public health emergency.  Safety protocols were in place, including screening questions prior to the visit, additional usage of staff PPE, and extensive cleaning of exam room while observing appropriate contact time as indicated for disinfecting solutions.   I'm seeing this patient by the request  of:  [name], MD  CC: Right-sided olecranon bursitis  [id number]   05/19/2019 Right-sided olecranon bursitis.  No significant signs of any type of infectious etiology and patient states that over the last 3 weeks seems to be improving.  Discussed avoiding contact in this area, compression sleeve, topical anti-inflammatories, worsening symptoms will consider the possibility of aspiration.  Patient is in agreement with the plan.  Follow-up with me again 5 weeks  Update 06/22/2019 Paul Ryan is a 74 y.o. male coming in with complaint of right elbow pain. Patient states that he is improving. Does have pain with working on occasion. Pain character is burning and sharp. Using compression sleeve and Pennsaid.       Past Medical History:  Diagnosis Date  . Acid reflux 02/03/2016  . Allergy    seasonal  . Anxiety 12/01/2016  . Chronic anticoagulation 06/14/2013  . Coagulopathy ([place]) 05/16/2011  . Collagen vascular disease ([place])   . Cough 12/01/2016  . DVT (deep venous thrombosis) ([name])    takes coumadin  . Dysuria 02/27/2013  . Erectile dysfunction 12/28/2016  . 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 Hyperglycemia 12/26/2014  . Hyperlipidemia   . Hypothyroid 06/10/2011  . Insomnia 04/24/2016  . Left hip pain 12/17/2010  . MCI (mild cognitive impairment) 07/22/2016  . Nasal lesion 07/22/2016  . Polymyalgia ([place]) 03/21/2014  . Positive QuantiFERON-TB Gold test  04/01/2015  . Preventative health care 12/17/2010  . Shingles 1982   right abdominal wall  . Staph skin infection 07/22/2016  . Thyroid nodule 11/29/2014  . Urinary hesitancy 09/30/2016  . Vitamin D deficiency 10/14/2011   Past Surgical History:  Procedure Laterality Date  . 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 Marital status: Married    Spouse name: Not on file  . Number of children: Not on file  . Years of education: Not on file  . Highest education level: Not on file  Occupational History  . Not on file  Tobacco Use  . Smoking status: Former Smoker    Packs/day: 1.00    Years: 20.00    Pack years: 20.00    Types: Cigarettes    Quit date: 03/03/1980    Years since quitting: 39.3  . Smokeless tobacco: Never Used  Substance and Sexual Activity  . Alcohol use: No    Alcohol/week: 0.0 standard drinks  . Drug use: No  . Sexual activity: Not on file  Other Topics Concern  . Not on file  Social History Narrative  . Not on file   Social Determinants of Health   Financial Resource Strain:   . Difficulty of Paying Living Expenses:   Food Insecurity:   . Worried About Charity fundraiser in the Last Year:   . Arboriculturist in the Last Year:   Transportation Needs:   . Film/video editor (Medical):   [name] Kitchen Lack of  Transportation (Non-Medical):   Physical Activity:   . Days of Exercise per Week:   . Minutes of Exercise per Session:   Stress:   . Feeling of Stress :   Social Connections:   . Frequency of Communication with Friends and Family:   . Frequency of Social Gatherings with Friends and Family:   . Attends Religious Services:   . Active Member of Clubs or Organizations:   . Attends Archivist Meetings:   [name] Kitchen Marital Status:    No Known Allergies Family History  Problem Relation Age of Onset  . Cancer Mother        unknown- everywhere  . Cancer Sister        breast    Current  Outpatient Medications (Endocrine & Metabolic):  .  thyroid (ARMOUR THYROID) 60 MG tablet, TAKE ONE TABLET BY MOUTH ON MONDAY, WEDNESDAY, THURSDAY, FRIDAY, AND SUNDAY. [name] Kitchen  thyroid (ARMOUR THYROID) 90 MG tablet, TAKE 1 TABLET BY MOUTH DAILY ON TUESDAY AND SATURDAY  Current Facility-Administered Medications (Endocrine & Metabolic):  .  triamcinolone acetonide (KENALOG) 10 MG/ML injection 10 mg  Current Outpatient Medications (Cardiovascular):  .  rosuvastatin (CRESTOR) 10 MG tablet, Take 1 tablet (10 mg total) by mouth daily.   Current Outpatient Medications (Respiratory):  .  cetirizine (ZYRTEC) 10 MG tablet, TAKE 1 TABLET BY MOUTH EVERY DAY AS NEEDED FOR ALLERGIES     Current Outpatient Medications (Hematological):  .  enoxaparin (LOVENOX) 120 MG/0.8ML injection, Inject 0.8 mLs (120 mg total) into the skin daily. Use only if INR < 2.0 or as instructed, discontinue when INR 2.0-3.0 .  warfarin (COUMADIN) 4 MG tablet, Take 1 tablet (4 mg total) by mouth daily. Adjust dose as instructed (Patient taking differently: Take 4 mg by mouth daily. Adjust dose as instructed--4mg  qd and 2mg  Tue/Fri)   Current Outpatient Medications (Other):  [name] Kitchen  Ascorbic Acid (VITAMIN C ER PO), Take by mouth daily. [name] Kitchen  b complex vitamins tablet, Take 1 tablet by mouth daily.   .  Cholecalciferol 4000 UNITS TABS, Take 1 tablet by mouth daily. .  Coenzyme Q10 (COQ10 PO), Take 2 tablets by mouth daily. .  Cranberry 250 MG TABS, Take 1 tablet by mouth daily. .  DULoxetine (CYMBALTA) 30 MG capsule, Take 30 mg by mouth 2 (two) times daily. .  NON FORMULARY, AHCC immune support .  pantoprazole (PROTONIX) 40 MG tablet, Take 40 mg by mouth daily.    Reviewed prior external information including notes and imaging from  primary care provider As well as notes that were available from care everywhere and other healthcare systems.  Past medical history, social, surgical and family history all reviewed in electronic medical  record.  No pertanent information unless stated regarding to the chief complaint.   Review of Systems:  No headache, visual changes, nausea, vomiting, diarrhea, constipation, dizziness, abdominal pain, skin rash, fevers, chills, night sweats, weight loss, swollen lymph nodes, body aches,  chest pain, shortness of breath, mood changes. POSITIVE muscle aches, joint swelling  Objective  Blood pressure 110/68, pulse 81, height 6' (1.829 m), weight 178 lb (80.7 kg), SpO2 96 %.   General: No apparent distress alert and oriented x3 mood and affect normal, dressed appropriately.  HEENT: Pupils equal, extraocular movements intact  Respiratory: Patient's speak in full sentences and does not appear short of breath  Cardiovascular: No lower extremity edema, non tender, no erythema  Neuro: Cranial nerves II through XII are intact, neurovascularly intact in all extremities  with 2+ DTRs and 2+ pulses.  Gait normal with good balance and coordination.  MSK: Right elbow shows the patient does have near full range of motion but does lack the last 2 degrees secondary to the amount of inflammation and swelling over the olecranon bursa.  No erythema noted.   Procedure: Real-time Ultrasound Guided Injection of olecranon bursa Device: GE Logiq Q7 Ultrasound guided injection is preferred based studies that show increased duration, increased effect, greater accuracy, decreased procedural pain, increased response rate, and decreased cost with ultrasound guided versus blind injection.  Verbal informed consent obtained.  Time-out conducted.  Noted no overlying erythema, induration, or other signs of local infection.  Skin prepped in a sterile fashion.  Local anesthesia: Topical Ethyl chloride.  With sterile technique and under real time ultrasound guidance: With a 18-gauge 1-1/2 inch needle patient was injected with 1 cc of 0.5% Marcaine and then had aspiration of 8 cc of straw-colored fluid then injected 1 cc of  Kenalog 40 mg into the bursa Completed without difficulty  Pain immediately resolved suggesting accurate placement of the medication.  Advised to call if fevers/chills, erythema, induration, drainage, or persistent bleeding.  Images permanently stored and available for review in the ultrasound unit.  Impression: Technically successful ultrasound guided injection.   Impression and Recommendations:     This case required medical decision making of moderate complexity. The above documentation has been reviewed and is accurate and complete Lyndal Pulley, DO       Note: This dictation was prepared with Dragon dictation along with smaller phrase technology. Any transcriptional errors that result from this process are unintentional.

## 2019-06-23 ENCOUNTER — Other Ambulatory Visit: Payer: Self-pay

## 2019-06-23 ENCOUNTER — Ambulatory Visit: Payer: Medicare PPO | Admitting: Family Medicine

## 2019-06-23 ENCOUNTER — Ambulatory Visit (INDEPENDENT_AMBULATORY_CARE_PROVIDER_SITE_OTHER): Payer: Medicare PPO

## 2019-06-23 ENCOUNTER — Encounter: Payer: Self-pay | Admitting: Family Medicine

## 2019-06-23 VITALS — BP 110/68 | HR 81 | Ht 72.0 in | Wt 178.0 lb

## 2019-06-23 DIAGNOSIS — M25521 Pain in right elbow: Secondary | ICD-10-CM

## 2019-06-23 DIAGNOSIS — M2550 Pain in unspecified joint: Secondary | ICD-10-CM

## 2019-06-23 DIAGNOSIS — M7021 Olecranon bursitis, right elbow: Secondary | ICD-10-CM

## 2019-06-23 NOTE — Addendum Note (Signed)
Addended by: Trenda Moots on: [id number] 09:52 AM   Modules accepted: Orders

## 2019-06-23 NOTE — Assessment & Plan Note (Signed)
Aspiration done today.  Tolerated the procedure well, discussed icing regimen and home exercise, which activities to doing which wants to avoid.  Patient will continue with compression.  This is a chronic problem with mild exacerbation.  Hopefully patient will do well with conservative therapy and follow-up again in 4 to 8 weeks.  Discussed medication management including the Cymbalta

## 2019-06-23 NOTE — Patient Instructions (Signed)
Good to see you.  See me again in 5 weeks

## 2019-06-24 ENCOUNTER — Encounter: Payer: Self-pay | Admitting: Oncology

## 2019-06-24 LAB — SYNOVIAL CELL COUNT + DIFF, W/ CRYSTALS
Basophils, %: 0 %
Eosinophils-Synovial: 0 % (ref 0–2)
Lymphocytes-Synovial Fld: 55 % (ref 0–74)
Monocyte/Macrophage: 35 % (ref 0–69)
Neutrophil, Synovial: 10 % (ref 0–24)
Synoviocytes, %: 0 % (ref 0–15)
WBC, Synovial: 648 cells/uL — ABNORMAL HIGH (ref <150)

## 2019-06-24 LAB — POCT INR: INR: 3 (ref 2.0–3.0)

## 2019-07-01 DIAGNOSIS — G459 Transient cerebral ischemic attack, unspecified: Secondary | ICD-10-CM | POA: Diagnosis not present

## 2019-07-01 DIAGNOSIS — D6859 Other primary thrombophilia: Secondary | ICD-10-CM | POA: Diagnosis not present

## 2019-07-01 DIAGNOSIS — R4189 Other symptoms and signs involving cognitive functions and awareness: Secondary | ICD-10-CM | POA: Diagnosis not present

## 2019-07-01 DIAGNOSIS — G629 Polyneuropathy, unspecified: Secondary | ICD-10-CM | POA: Diagnosis not present

## 2019-07-22 ENCOUNTER — Encounter: Payer: Self-pay | Admitting: Oncology

## 2019-07-22 ENCOUNTER — Telehealth: Payer: Self-pay

## 2019-07-22 DIAGNOSIS — D6859 Other primary thrombophilia: Secondary | ICD-10-CM | POA: Diagnosis not present

## 2019-07-22 DIAGNOSIS — Z7901 Long term (current) use of anticoagulants: Secondary | ICD-10-CM | POA: Diagnosis not present

## 2019-07-22 DIAGNOSIS — I2699 Other pulmonary embolism without acute cor pulmonale: Secondary | ICD-10-CM | POA: Diagnosis not present

## 2019-07-22 NOTE — Telephone Encounter (Signed)
Patient called stating he has an appointment on the 27th and states he is perfectly well after the last visit and wants to know if he still needs to be seen or not.

## 2019-07-25 ENCOUNTER — Encounter: Payer: Self-pay | Admitting: Family Medicine

## 2019-07-25 NOTE — Telephone Encounter (Signed)
Left message for patient that it is ok to call to cancel appointment if doing better. Awaiting call back to confirm.

## 2019-07-27 NOTE — Progress Notes (Signed)
I connected with Bear today by telephone and verified that I am speaking with the correct person using two identifiers. Location patient: home Location provider: work Persons participating in the virtual visit: patient, Therapist, sports.   I discussed the limitations, risks, security and privacy concerns of performing an evaluation and management service by telephone and the availability of in person appointments. I also discussed with the patient that there may be a patient responsible charge related to this service. The patient expressed understanding and verbally consented to this telephonic visit.    Interactive audio and video telecommunications were attempted between RN and patient, however failed, due to patient having technical difficulties OR patient did not have access to video capability.  We continued and completed visit with audio only.  Some vital signs may be absent or patient reported.   Subjective:   Rachit Fladger is a 74 y.o. male who presents for Medicare Annual/Subsequent preventive examination.  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 Enjoys sculpting, walking 5 miles per day and lifting weights.   Review of Systems:  Home Safety/Smoke Alarms: Feels safe in home. Smoke alarms in place.  Lives with wife in 1 story home.   Male:   CCS- 06/19/16 per pt.   PSA-  Lab Results  Component Value Date   PSA 1.53 07/18/2016   PSA 1.63 10/14/2011       Objective:    Vitals: Unable to assess. This visit is enabled though telemedicine due to Covid 19.   Advanced Directives 07/28/2019 06/20/2019 06/22/2018 02/01/2018 11/17/2017 01/07/2016 11/05/2015  Does Patient Have a Medical Advance Directive? Yes No No Yes Yes Yes No  Type of Paramedic of [place];Living will - - [place];Living will [place];Living will Living will;Healthcare Power of Attorney -  Does patient want to make changes to  medical advance directive? No - Patient declined - - - No - Patient declined No - Patient declined -  Copy of [place] in Chart? No - copy requested - - No - copy requested No - copy requested No - copy requested -  Would patient like information on creating a medical advance directive? - No - Patient declined No - Patient declined - - - No - patient declined information    Tobacco Social History   Tobacco Use  Smoking Status Former Smoker  . Packs/day: 1.00  . Years: 20.00  . Pack years: 20.00  . Types: Cigarettes  . Quit date: 03/03/1980  . Years since quitting: 39.4  Smokeless Tobacco Never Used     Counseling given: Not Answered   Clinical Intake: Pain : No/denies pain     Past Medical History:  Diagnosis Date  . Acid reflux 02/03/2016  . Allergy    seasonal  . Anxiety 12/01/2016  . Chronic anticoagulation 06/14/2013  . Coagulopathy ([place]) 05/16/2011  . Collagen vascular disease ([place])   . Cough 12/01/2016  . DVT (deep venous thrombosis) ([name])    takes coumadin  . Dysuria 02/27/2013  . Erectile dysfunction 12/28/2016  . 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 Hyperglycemia 12/26/2014  . Hyperlipidemia   . Hypothyroid 06/10/2011  . Insomnia 04/24/2016  . Left hip pain 12/17/2010  . MCI (mild cognitive impairment) 07/22/2016  . Nasal lesion 07/22/2016  . Polymyalgia ([place]) 03/21/2014  . 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 Preventative health care 12/17/2010  . Shingles 1982   right abdominal  wall  . Staph skin infection 07/22/2016  . Thyroid nodule 11/29/2014  . Urinary hesitancy 09/30/2016  . Vitamin D deficiency 10/14/2011   Past Surgical History:  Procedure Laterality Date  . 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 Cancer Mother        unknown- everywhere  . Cancer Sister        breast   Social History   Socioeconomic History  . Marital status: Married      Spouse name: Not on file  . Number of children: Not on file  . Years of education: Not on file  . Highest education level: Not on file  Occupational History  . Not on file  Tobacco Use  . Smoking status: Former Smoker    Packs/day: 1.00    Years: 20.00    Pack years: 20.00    Types: Cigarettes    Quit date: 03/03/1980    Years since quitting: 39.4  . Smokeless tobacco: Never Used  Substance and Sexual Activity  . Alcohol use: No    Alcohol/week: 0.0 standard drinks  . Drug use: No  . Sexual activity: Not on file  Other Topics Concern  . Not on file  Social History Narrative  . Not on file   Social Determinants of Health   Financial Resource Strain: Low Risk   . Difficulty of Paying Living Expenses: Not hard at all  Food Insecurity: No Food Insecurity  . Worried About Charity fundraiser in the Last Year: Never true  . Ran Out of Food in the Last Year: Never true  Transportation Needs: No Transportation Needs  . Lack of Transportation (Medical): No  . Lack of Transportation (Non-Medical): No  Physical Activity:   . Days of Exercise per Week:   . Minutes of Exercise per Session:   Stress:   . Feeling of Stress :   Social Connections:   . Frequency of Communication with Friends and Family:   . Frequency of Social Gatherings with Friends and Family:   . Attends Religious Services:   . Active Member of Clubs or Organizations:   . Attends Archivist Meetings:   [name] Kitchen Marital Status:     Outpatient Encounter Medications as of 07/28/2019  Medication Sig  . Ascorbic Acid (VITAMIN C ER PO) Take by mouth daily.  [name] Kitchen b complex vitamins tablet Take 1 tablet by mouth daily.    . cetirizine (ZYRTEC) 10 MG tablet TAKE 1 TABLET BY MOUTH EVERY DAY AS NEEDED FOR ALLERGIES  . Cholecalciferol 4000 UNITS TABS Take 1 tablet by mouth daily.  . Coenzyme Q10 (COQ10 PO) Take 2 tablets by mouth daily.  . DULoxetine (CYMBALTA) 30 MG capsule Take 30 mg by mouth 2 (two) times daily.  [name] Kitchen  enoxaparin (LOVENOX) 120 MG/0.8ML injection Inject 0.8 mLs (120 mg total) into the skin daily. Use only if INR < 2.0 or as instructed, discontinue when INR 2.0-3.0  . NON FORMULARY AHCC immune support  . pantoprazole (PROTONIX) 40 MG tablet Take 40 mg by mouth daily.  . rosuvastatin (CRESTOR) 10 MG tablet Take 1 tablet (10 mg total) by mouth daily.  [name] Kitchen thyroid (ARMOUR THYROID) 60 MG tablet TAKE ONE TABLET BY MOUTH ON MONDAY, WEDNESDAY, THURSDAY, FRIDAY, AND SUNDAY.  [name] Kitchen thyroid (ARMOUR THYROID) 90 MG tablet TAKE 1 TABLET BY MOUTH DAILY ON TUESDAY AND SATURDAY  . warfarin (COUMADIN) 4 MG tablet  Take 1 tablet (4 mg total) by mouth daily. Adjust dose as instructed (Patient taking differently: Take 4 mg by mouth daily. Adjust dose as instructed--4mg  qd and 2mg  Tue/Fri)  . Cranberry 250 MG TABS Take 1 tablet by mouth daily.   Facility-Administered Encounter Medications as of 07/28/2019  Medication  . triamcinolone acetonide (KENALOG) 10 MG/ML injection 10 mg    Activities of Daily Living In your present state of health, do you have any difficulty performing the following activities: 07/28/2019  Hearing? N  Vision? N  Difficulty concentrating or making decisions? N  Walking or climbing stairs? N  Dressing or bathing? N  Doing errands, shopping? N  Preparing Food and eating ? N  Using the Toilet? N  In the past six months, have you accidently leaked urine? N  Do you have problems with loss of bowel control? N  Managing your Medications? N  Managing your Finances? N  Housekeeping or managing your Housekeeping? N  Some recent data might be hidden    Patient Care Team: [name], MD as PCP - General (Family Medicine) Roena Malady, MD (Ophthalmology) Forde Dandy, PharmD (Inactive) as Pharmacist (Pharmacist)   Assessment:   This is a routine wellness examination for [place]. Physical assessment deferred to PCP.  Exercise Activities and Dietary recommendations Current Exercise  Habits: Home exercise routine, Type of exercise: walking;strength training/weights, Time (Minutes): 45, Frequency (Times/Week): 7, Weekly Exercise (Minutes/Week): 315, Intensity: Mild, Exercise limited by: None identified Diet (meal preparation, eat out, water intake, caffeinated beverages, dairy products, fruits and vegetables): in general, a "healthy" diet  , well balanced   Goals    . Continue to walk daily and eat a healthy diet       Fall Risk Fall Risk  07/28/2019 06/22/2018 02/01/2018 11/17/2017 09/21/2017  Falls in the past year? 0 0 1 No No  Number falls in past yr: 0 - 0 - -  Injury with Fall? 0 - 1 - -  Risk for fall due to : - - Other (Comment) - -  Risk for fall due to: Comment - - on warfarin - -  Follow up Education provided;Falls prevention discussed - Falls prevention discussed - -   Depression Screen PHQ 2/9 Scores 07/28/2019 07/28/2019 06/22/2018 02/01/2018  PHQ - 2 Score 0 0 0 0  PHQ- 9 Score - - - 0    Cognitive Function Ad8 score reviewed for issues:  Issues making decisions:no  Less interest in hobbies / activities:no  Repeats questions, stories (family complaining):no  Trouble using ordinary gadgets (microwave, computer, phone):no  Forgets the month or year: no  Mismanaging finances: no  Remembering appts:no  Daily problems with thinking and/or memory:no Ad8 score is=0     Montreal Cognitive Assessment  09/21/2017  Visuospatial/ Executive (0/5) 5  Naming (0/3) 3  Attention: Read list of digits (0/2) 2  Attention: Read list of letters (0/1) 1  Attention: Serial 7 subtraction starting at 100 (0/3) 2  Language: Repeat phrase (0/2) 2  Language : Fluency (0/1) 1  Abstraction (0/2) 2  Delayed Recall (0/5) 4  Orientation (0/6) 6  Total 28      Immunization History  Administered Date(s) Administered  . Hepatitis A 11/09/1998  . IPV 11/09/1998  . Influenza Split 11/22/2015  . Influenza Whole 12/13/2012  . Influenza, High Dose Seasonal PF  10/31/2016, 11/01/2018  . Influenza,inj,Quad PF,6+ Mos 10/25/2013, 12/26/2014  . Influenza-Unspecified 12/02/2011, 12/02/2017, 12/02/2017  . PFIZER SARS-COV-2 Vaccination 04/08/2019, 05/02/2019  .  PPD Test 03/23/2015  . Pneumococcal Conjugate-13 10/25/2013  . Pneumococcal Polysaccharide-23 06/19/2009, 12/26/2014  . Td 03/03/1990, 07/01/2001  . Tdap 12/17/2010  . Typhoid Inactivated 11/09/1998, 03/09/2001  . Zoster 08/01/2008  . Zoster Recombinat (Shingrix) 11/03/2018, 05/19/2019   Screening Tests Health Maintenance  Topic Date Due  . FOOT EXAM  Never done  . OPHTHALMOLOGY EXAM  Never done  . URINE MICROALBUMIN  Never done  . INFLUENZA VACCINE  10/02/2019  . HEMOGLOBIN A1C  11/12/2019  . TETANUS/TDAP  12/16/2020  . COLONOSCOPY  06/20/2026  . COVID-19 Vaccine  Completed  . Hepatitis C Screening  Completed  . PNA vac Low Risk Adult  Completed     Plan:    Please schedule your next medicare wellness visit with me in 1 yr.  Continue to eat heart healthy diet (full of fruits, vegetables, whole grains, lean protein, water--limit salt, fat, and sugar intake) and increase physical activity as tolerated.  Continue doing brain stimulating activities (puzzles, reading, adult coloring books, staying active) to keep memory sharp.   Bring a copy of your living will and/or healthcare power of attorney to your next office visit.   I have personally reviewed and noted the following in the patient's chart:   . Medical and social history . Use of alcohol, tobacco or illicit drugs  . Current medications and supplements . Functional ability and status . Nutritional status . Physical activity . Advanced directives . List of other physicians . Hospitalizations, surgeries, and ER visits in previous 12 months . Vitals . Screenings to include cognitive, depression, and falls . Referrals and appointments  In addition, I have reviewed and discussed with patient certain preventive protocols,  quality metrics, and best practice recommendations. A written personalized care plan for preventive services as well as general preventive health recommendations were provided to patient.     Naaman Plummer [place], [place]  07/28/2019

## 2019-07-28 ENCOUNTER — Other Ambulatory Visit: Payer: Self-pay

## 2019-07-28 ENCOUNTER — Ambulatory Visit (INDEPENDENT_AMBULATORY_CARE_PROVIDER_SITE_OTHER): Payer: Medicare PPO | Admitting: *Deleted

## 2019-07-28 ENCOUNTER — Encounter: Payer: Self-pay | Admitting: *Deleted

## 2019-07-28 ENCOUNTER — Ambulatory Visit: Payer: Medicare PPO | Admitting: Family Medicine

## 2019-07-28 DIAGNOSIS — Z0000 Encounter for general adult medical examination without abnormal findings: Secondary | ICD-10-CM

## 2019-07-28 NOTE — Patient Instructions (Signed)
Please schedule your next medicare wellness visit with me in 1 yr.  Continue to eat heart healthy diet (full of fruits, vegetables, whole grains, lean protein, water--limit salt, fat, and sugar intake) and increase physical activity as tolerated.  Continue doing brain stimulating activities (puzzles, reading, adult coloring books, staying active) to keep memory sharp.   Bring a copy of your living will and/or healthcare power of attorney to your next office visit.   Paul Ryan , Thank you for taking time to come for your Medicare Wellness Visit. I appreciate your ongoing commitment to your health goals. Please review the following plan we discussed and let me know if I can assist you in the future.   These are the goals we discussed: Goals    . Continue to walk daily and eat a healthy diet       This is a list of the screening recommended for you and due dates:  Health Maintenance  Topic Date Due  . Complete foot exam   Never done  . Eye exam for diabetics  Never done  . Urine Protein Check  Never done  . Flu Shot  10/02/2019  . Hemoglobin A1C  11/12/2019  . Tetanus Vaccine  12/16/2020  . Colon Cancer Screening  06/20/2026  . COVID-19 Vaccine  Completed  .  Hepatitis C: One time screening is recommended by Center for Disease Control  (CDC) for  adults born from [birth year] through 1965.   Completed  . Pneumonia vaccines  Completed    Preventive Care 68 Years and Older, Male Preventive care refers to lifestyle choices and visits with your health care provider that can promote health and wellness. This includes:  A yearly physical exam. This is also called an annual well check.  Regular dental and eye exams.  Immunizations.  Screening for certain conditions.  Healthy lifestyle choices, such as diet and exercise. What can I expect for my preventive care visit? Physical exam Your health care provider will check:  Height and weight. These may be used to calculate body mass index  (BMI), which is a measurement that tells if you are at a healthy weight.  Heart rate and blood pressure.  Your skin for abnormal spots. Counseling Your health care provider may ask you questions about:  Alcohol, tobacco, and drug use.  Emotional well-being.  Home and relationship well-being.  Sexual activity.  Eating habits.  History of falls.  Memory and ability to understand (cognition).  Work and work Statistician. What immunizations do I need?  Influenza (flu) vaccine  This is recommended every year. Tetanus, diphtheria, and pertussis (Tdap) vaccine  You may need a Td booster every 10 years. Varicella (chickenpox) vaccine  You may need this vaccine if you have not already been vaccinated. Zoster (shingles) vaccine  You may need this after age 40. Pneumococcal conjugate (PCV13) vaccine  One dose is recommended after age 62. Pneumococcal polysaccharide (PPSV23) vaccine  One dose is recommended after age 41. Measles, mumps, and rubella (MMR) vaccine  You may need at least one dose of MMR if you were born in 1957 or later. You may also need a second dose. Meningococcal conjugate (MenACWY) vaccine  You may need this if you have certain conditions. Hepatitis A vaccine  You may need this if you have certain conditions or if you travel or work in places where you may be exposed to hepatitis A. Hepatitis B vaccine  You may need this if you have certain conditions or if you  travel or work in places where you may be exposed to hepatitis B. Haemophilus influenzae type b (Hib) vaccine  You may need this if you have certain conditions. You may receive vaccines as individual doses or as more than one vaccine together in one shot (combination vaccines). Talk with your health care provider about the risks and benefits of combination vaccines. What tests do I need? Blood tests  Lipid and cholesterol levels. These may be checked every 5 years, or more frequently  depending on your overall health.  Hepatitis C test.  Hepatitis B test. Screening  Lung cancer screening. You may have this screening every year starting at age 62 if you have a 30-pack-year history of smoking and currently smoke or have quit within the past 15 years.  Colorectal cancer screening. All adults should have this screening starting at age 57 and continuing until age 3. Your health care provider may recommend screening at age 75 if you are at increased risk. You will have tests every 1-10 years, depending on your results and the type of screening test.  Prostate cancer screening. Recommendations will vary depending on your family history and other risks.  Diabetes screening. This is done by checking your blood sugar (glucose) after you have not eaten for a while (fasting). You may have this done every 1-3 years.  Abdominal aortic aneurysm (AAA) screening. You may need this if you are a current or former smoker.  Sexually transmitted disease (STD) testing. Follow these instructions at home: Eating and drinking  Eat a diet that includes fresh fruits and vegetables, whole grains, lean protein, and low-fat dairy products. Limit your intake of foods with high amounts of sugar, saturated fats, and salt.  Take vitamin and mineral supplements as recommended by your health care provider.  Do not drink alcohol if your health care provider tells you not to drink.  If you drink alcohol: ? Limit how much you have to 0-2 drinks a day. ? Be aware of how much alcohol is in your drink. In the [place] one drink equals one 12 oz bottle of beer (355 mL), one 5 oz glass of wine (148 mL), or one 1 oz glass of hard liquor (44 mL). Lifestyle  Take daily care of your teeth and gums.  Stay active. Exercise for at least 30 minutes on 5 or more days each week.  Do not use any products that contain nicotine or tobacco, such as cigarettes, e-cigarettes, and chewing tobacco. If you need help  quitting, ask your health care provider.  If you are sexually active, practice safe sex. Use a condom or other form of protection to prevent STIs (sexually transmitted infections).  Talk with your health care provider about taking a low-dose aspirin or statin. What's next?  Visit your health care provider once a year for a well check visit.  Ask your health care provider how often you should have your eyes and teeth checked.  Stay up to date on all vaccines. This information is not intended to replace advice given to you by your health care provider. Make sure you discuss any questions you have with your health care provider. Document Revised: 02/11/2018 Document Reviewed: 02/11/2018 Elsevier Patient Education  2020 [employer].

## 2019-07-31 ENCOUNTER — Other Ambulatory Visit: Payer: Self-pay | Admitting: Family Medicine

## 2019-08-01 DIAGNOSIS — R4189 Other symptoms and signs involving cognitive functions and awareness: Secondary | ICD-10-CM | POA: Diagnosis not present

## 2019-08-01 DIAGNOSIS — G629 Polyneuropathy, unspecified: Secondary | ICD-10-CM | POA: Diagnosis not present

## 2019-08-01 DIAGNOSIS — G459 Transient cerebral ischemic attack, unspecified: Secondary | ICD-10-CM | POA: Diagnosis not present

## 2019-08-01 DIAGNOSIS — D6859 Other primary thrombophilia: Secondary | ICD-10-CM | POA: Diagnosis not present

## 2019-08-05 ENCOUNTER — Other Ambulatory Visit: Payer: Self-pay | Admitting: Family Medicine

## 2019-08-22 ENCOUNTER — Ambulatory Visit: Payer: Medicare PPO | Admitting: Family Medicine

## 2019-08-26 ENCOUNTER — Other Ambulatory Visit: Payer: Self-pay | Admitting: Family Medicine

## 2019-08-31 DIAGNOSIS — F329 Major depressive disorder, single episode, unspecified: Secondary | ICD-10-CM | POA: Diagnosis not present

## 2019-08-31 DIAGNOSIS — G44229 Chronic tension-type headache, not intractable: Secondary | ICD-10-CM | POA: Diagnosis not present

## 2019-08-31 DIAGNOSIS — I10 Essential (primary) hypertension: Secondary | ICD-10-CM | POA: Diagnosis not present

## 2019-09-16 ENCOUNTER — Encounter: Payer: Self-pay | Admitting: Oncology

## 2019-09-20 ENCOUNTER — Other Ambulatory Visit: Payer: Self-pay

## 2019-09-20 ENCOUNTER — Ambulatory Visit: Payer: Medicare PPO | Admitting: Family Medicine

## 2019-09-20 ENCOUNTER — Encounter: Payer: Self-pay | Admitting: Family Medicine

## 2019-09-20 VITALS — BP 106/68 | HR 65 | Temp 97.9°F | Resp 12 | Ht 72.0 in | Wt 170.6 lb

## 2019-09-20 DIAGNOSIS — E559 Vitamin D deficiency, unspecified: Secondary | ICD-10-CM

## 2019-09-20 DIAGNOSIS — K219 Gastro-esophageal reflux disease without esophagitis: Secondary | ICD-10-CM

## 2019-09-20 DIAGNOSIS — E039 Hypothyroidism, unspecified: Secondary | ICD-10-CM | POA: Diagnosis not present

## 2019-09-20 DIAGNOSIS — E785 Hyperlipidemia, unspecified: Secondary | ICD-10-CM

## 2019-09-20 DIAGNOSIS — G3184 Mild cognitive impairment, so stated: Secondary | ICD-10-CM

## 2019-09-20 DIAGNOSIS — R739 Hyperglycemia, unspecified: Secondary | ICD-10-CM | POA: Diagnosis not present

## 2019-09-20 LAB — COMPREHENSIVE METABOLIC PANEL
ALT: 18 U/L (ref 0–53)
AST: 18 U/L (ref 0–37)
Albumin: 4.1 g/dL (ref 3.5–5.2)
Alkaline Phosphatase: 57 U/L (ref 39–117)
BUN: 14 mg/dL (ref 6–23)
CO2: 29 mEq/L (ref 19–32)
Calcium: 9.1 mg/dL (ref 8.4–10.5)
Chloride: 103 mEq/L (ref 96–112)
Creatinine, Ser: 0.98 mg/dL (ref 0.40–1.50)
GFR: 74.55 mL/min (ref >60.00)
Glucose, Bld: 100 mg/dL — ABNORMAL HIGH (ref 70–99)
Potassium: 4.7 mEq/L (ref 3.5–5.1)
Sodium: 138 mEq/L (ref 135–145)
Total Bilirubin: 0.6 mg/dL (ref 0.2–1.2)
Total Protein: 7 g/dL (ref 6.0–8.3)

## 2019-09-20 LAB — HEMOGLOBIN A1C: Hgb A1c MFr Bld: 6.6 % — ABNORMAL HIGH (ref 4.6–6.5)

## 2019-09-20 LAB — LIPID PANEL
Cholesterol: 201 mg/dL — ABNORMAL HIGH (ref 0–200)
HDL: 39.7 mg/dL (ref >39.00)
LDL Cholesterol: 134 mg/dL — ABNORMAL HIGH (ref 0–99)
NonHDL: 161.78
Total CHOL/HDL Ratio: 5
Triglycerides: 140 mg/dL (ref 0.0–149.0)
VLDL: 28 mg/dL (ref 0.0–40.0)

## 2019-09-20 LAB — CBC
HCT: 44.9 % (ref 39.0–52.0)
Hemoglobin: 14.9 g/dL (ref 13.0–17.0)
MCHC: 33.2 g/dL (ref 30.0–36.0)
MCV: 88 fl (ref 78.0–100.0)
Platelets: 249 10*3/uL (ref 150.0–400.0)
RBC: 5.1 Mil/uL (ref 4.22–5.81)
RDW: 14.7 % (ref 11.5–15.5)
WBC: 8.3 10*3/uL (ref 4.0–10.5)

## 2019-09-20 LAB — TSH: TSH: 0.43 u[IU]/mL (ref 0.35–4.50)

## 2019-09-20 LAB — VITAMIN D 25 HYDROXY (VIT D DEFICIENCY, FRACTURES): VITD: 69.59 ng/mL (ref 30.00–100.00)

## 2019-09-20 NOTE — Patient Instructions (Signed)
Lupita Shutter [place] books, [place] in the spring.   [employer] [place]  High Cholesterol  High cholesterol is a condition in which the blood has high levels of a white, waxy, fat-like substance (cholesterol). The human body needs small amounts of cholesterol. The liver makes all the cholesterol that the body needs. Extra (excess) cholesterol comes from the food that we eat. Cholesterol is carried from the liver by the blood through the blood vessels. If you have high cholesterol, deposits (plaques) may build up on the walls of your blood vessels (arteries). Plaques make the arteries narrower and stiffer. Cholesterol plaques increase your risk for heart attack and stroke. Work with your health care provider to keep your cholesterol levels in a healthy range. What increases the risk? This condition is more likely to develop in people who:  Eat foods that are high in animal fat (saturated fat) or cholesterol.  Are overweight.  Are not getting enough exercise.  Have a family history of high cholesterol. What are the signs or symptoms? There are no symptoms of this condition. How is this diagnosed? This condition may be diagnosed from the results of a blood test.  If you are older than age 80, your health care provider may check your cholesterol every 4-6 years.  You may be checked more often if you already have high cholesterol or other risk factors for heart disease. The blood test for cholesterol measures:  "Bad" cholesterol (LDL cholesterol). This is the main type of cholesterol that causes heart disease. The desired level for LDL is less than 100.  "Good" cholesterol (HDL cholesterol). This type helps to protect against heart disease by cleaning the arteries and carrying the LDL away. The desired level for HDL is 60 or higher.  Triglycerides. These are fats that the body can store or burn for energy. The desired number for  triglycerides is lower than 150.  Total cholesterol. This is a measure of the total amount of cholesterol in your blood, including LDL cholesterol, HDL cholesterol, and triglycerides. A healthy number is less than 200. How is this treated? This condition is treated with diet changes, lifestyle changes, and medicines. Diet changes  This may include eating more whole grains, fruits, vegetables, nuts, and fish.  This may also include cutting back on red meat and foods that have a lot of added sugar. Lifestyle changes  Changes may include getting at least 40 minutes of aerobic exercise 3 times a week. Aerobic exercises include walking, biking, and swimming. Aerobic exercise along with a healthy diet can help you maintain a healthy weight.  Changes may also include quitting smoking. Medicines  Medicines are usually given if diet and lifestyle changes have failed to reduce your cholesterol to healthy levels.  Your health care provider may prescribe a statin medicine. Statin medicines have been shown to reduce cholesterol, which can reduce the risk of heart disease. Follow these instructions at home: Eating and drinking If told by your health care provider:  Eat chicken (without skin), fish, veal, shellfish, ground [place] breast, and round or loin cuts of red meat.  Do not eat fried foods or fatty meats, such as hot dogs and salami.  Eat plenty of fruits, such as apples.  Eat plenty of vegetables, such as broccoli, potatoes, and carrots.  Eat beans, peas, and lentils.  Eat grains such as barley, rice, couscous, and bulgur wheat.  Eat pasta without cream sauces.  Use skim or  nonfat milk, and eat low-fat or nonfat yogurt and cheeses.  Do not eat or drink whole milk, cream, ice cream, egg yolks, or hard cheeses.  Do not eat stick margarine or tub margarines that contain trans fats (also called partially hydrogenated oils).  Do not eat saturated tropical oils, such as coconut oil and  palm oil.  Do not eat cakes, cookies, crackers, or other baked goods that contain trans fats.  General instructions  Exercise as directed by your health care provider. Increase your activity level with activities such as gardening, walking, and taking the stairs.  Take over-the-counter and prescription medicines only as told by your health care provider.  Do not use any products that contain nicotine or tobacco, such as cigarettes and e-cigarettes. If you need help quitting, ask your health care provider.  Keep all follow-up visits as told by your health care provider. This is important. Contact a health care provider if:  You are struggling to maintain a healthy diet or weight.  You need help to start on an exercise program.  You need help to stop smoking. Get help right away if:  You have chest pain.  You have trouble breathing. This information is not intended to replace advice given to you by your health care provider. Make sure you discuss any questions you have with your health care provider. Document Revised: 02/20/2017 Document Reviewed: 08/18/2015 Elsevier Patient Education  [place].

## 2019-09-20 NOTE — Assessment & Plan Note (Signed)
Supplement and monitor 

## 2019-09-20 NOTE — Assessment & Plan Note (Signed)
Tolerating statin, encouraged heart healthy diet, avoid trans fats, minimize simple carbs and saturated fats. Increase exercise as tolerated 

## 2019-09-20 NOTE — Assessment & Plan Note (Signed)
hgba1c acceptable, minimize simple carbs. Increase exercise as tolerated. Continue current meds 

## 2019-09-20 NOTE — Assessment & Plan Note (Signed)
On Levothyroxine, continue to monitor 

## 2019-09-21 NOTE — Progress Notes (Signed)
Patient ID: Paul Ryan, male   DOB: [birth year], 74 y.o.   MRN: [id number]   Subjective:    Patient ID: Paul Ryan, male    DOB: [birth year], 74 y.o.   MRN: [id number]  Chief Complaint  Patient presents with  . Follow-up    HPI Patient is in today for follow upon chronic medical concerns. No recent febrile illness or hospitalizations. He has been eating well and trying to stay active. No acute concerns. He continues to slowly get ready to move to the [place] area to be closer to his daughter. 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 Denies CP/palp/SOB/HA/congestion/fevers/GI or GU c/o. Taking meds as prescribed  Past Medical History:  Diagnosis Date  . Acid reflux 02/03/2016  . Allergy    seasonal  . Anxiety 12/01/2016  . Chronic anticoagulation 06/14/2013  . Coagulopathy ([place]) 05/16/2011  . Collagen vascular disease ([place])   . Cough 12/01/2016  . DVT (deep venous thrombosis) ([name])    takes coumadin  . Dysuria 02/27/2013  . Erectile dysfunction 12/28/2016  . 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 Hyperglycemia 12/26/2014  . Hyperlipidemia   . Hypothyroid 06/10/2011  . Insomnia 04/24/2016  . Left hip pain 12/17/2010  . MCI (mild cognitive impairment) 07/22/2016  . Nasal lesion 07/22/2016  . Polymyalgia ([place]) 03/21/2014  . 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 Preventative health care 12/17/2010  . Shingles 1982   right abdominal wall  . Staph skin infection 07/22/2016  . Thyroid nodule 11/29/2014  . Urinary hesitancy 09/30/2016  . Vitamin D deficiency 10/14/2011    Past Surgical History:  Procedure Laterality Date  . 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74 yrs old   4 surgeries    Family History  Problem Relation Age of Onset  . Cancer Mother        unknown- everywhere  . Cancer Sister        breast    Social History   Socioeconomic History  . Marital status: Married    Spouse name: Not on file  . Number of children: Not on file  . Years of education: Not  on file  . Highest education level: Not on file  Occupational History  . Not on file  Tobacco Use  . Smoking status: Former Smoker    Packs/day: 1.00    Years: 20.00    Pack years: 20.00    Types: Cigarettes    Quit date: 03/03/1980    Years since quitting: 39.5  . Smokeless tobacco: Never Used  Substance and Sexual Activity  . Alcohol use: No    Alcohol/week: 0.0 standard drinks  . Drug use: No  . Sexual activity: Not on file  Other Topics Concern  . Not on file  Social History Narrative  . Not on file   Social Determinants of Health   Financial Resource Strain: Low Risk   . Difficulty of Paying Living Expenses: Not hard at all  Food Insecurity: No Food Insecurity  . Worried About Charity fundraiser in the Last Year: Never true  . Ran Out of Food in the Last Year: Never true  Transportation Needs: No Transportation Needs  . Lack of Transportation (Medical): No  . Lack of Transportation (Non-Medical): No  Physical Activity:   . Days of Exercise per Week:   . Minutes of Exercise per Session:   Stress:   . Feeling of Stress :   Social Connections:   . Frequency of Communication with Friends  and Family:   . Frequency of Social Gatherings with Friends and Family:   . Attends Religious Services:   . Active Member of Clubs or Organizations:   . Attends Archivist Meetings:   [name] Kitchen Marital Status:   Intimate Partner Violence:   . Fear of Current or Ex-Partner:   . Emotionally Abused:   [name] Kitchen Physically Abused:   . Sexually Abused:     Outpatient Medications Prior to Visit  Medication Sig Dispense Refill  . Ascorbic Acid (VITAMIN C ER PO) Take by mouth daily.    [name] Kitchen b complex vitamins tablet Take 1 tablet by mouth daily.      . cetirizine (ZYRTEC) 10 MG tablet TAKE 1 TABLET BY MOUTH EVERY DAY AS NEEDED FOR ALLERGIES 30 tablet 4  . Cholecalciferol 4000 UNITS TABS Take 1 tablet by mouth daily.    . Coenzyme Q10 (COQ10 PO) Take 2 tablets by mouth daily.    . Cranberry  250 MG TABS Take 1 tablet by mouth daily.    . DULoxetine (CYMBALTA) 30 MG capsule Take 30 mg by mouth 2 (two) times daily.    [name] Kitchen enoxaparin (LOVENOX) 120 MG/0.8ML injection Inject 0.8 mLs (120 mg total) into the skin daily. Use only if INR < 2.0 or as instructed, discontinue when INR 2.0-3.0 30 Syringe 3  . NON FORMULARY AHCC immune support    . pantoprazole (PROTONIX) 40 MG tablet Take 40 mg by mouth daily.    . rosuvastatin (CRESTOR) 10 MG tablet Take 1 tablet (10 mg total) by mouth daily. 90 tablet 1  . thyroid (ARMOUR THYROID) 60 MG tablet TAKE ONE TABLET BY MOUTH ON MONDAY, WEDNESDAY, THURSDAY, FRIDAY, AND SUNDAY. 65 tablet 0  . thyroid (ARMOUR THYROID) 90 MG tablet TAKE 1 TABLET BY MOUTH BY MOUTH ON M-W-TH-FRI-SUN 30 tablet 1  . warfarin (COUMADIN) 4 MG tablet Take 1 tablet (4 mg total) by mouth daily. Adjust dose as instructed (Patient taking differently: Take 4 mg by mouth daily. Adjust dose as instructed--4mg  qd and 2mg  Tue/Fri) 90 tablet 3   Facility-Administered Medications Prior to Visit  Medication Dose Route Frequency Provider Last Rate Last Admin  . triamcinolone acetonide (KENALOG) 10 MG/ML injection 10 mg  10 mg Other Once Trula Slade, DPM        No Known Allergies  Review of Systems  Constitutional: Negative for fever and malaise/fatigue.  HENT: Negative for congestion.   Eyes: Negative for blurred vision.  Respiratory: Negative for shortness of breath.   Cardiovascular: Negative for chest pain, palpitations and leg swelling.  Gastrointestinal: Negative for abdominal pain, blood in stool and nausea.  Genitourinary: Negative for dysuria and frequency.  Musculoskeletal: Negative for falls.  Skin: Negative for rash.  Neurological: Negative for dizziness, loss of consciousness and headaches.  Endo/Heme/Allergies: Negative for environmental allergies.  Psychiatric/Behavioral: Negative for depression. The patient is not nervous/anxious.        Objective:     Physical Exam Vitals and nursing note reviewed.  Constitutional:      General: He is not in acute distress.    Appearance: He is well-developed.  HENT:     Head: Normocephalic and atraumatic.     Nose: Nose normal.  Eyes:     General:        Right eye: No discharge.        Left eye: No discharge.  Cardiovascular:     Rate and Rhythm: Normal rate and regular rhythm.     Heart  sounds: No murmur heard.   Pulmonary:     Effort: Pulmonary effort is normal.     Breath sounds: Normal breath sounds.  Abdominal:     General: Bowel sounds are normal.     Palpations: Abdomen is soft.     Tenderness: There is no abdominal tenderness.  Musculoskeletal:     Cervical back: Normal range of motion and neck supple.  Skin:    General: Skin is warm and dry.  Neurological:     Mental Status: He is alert and oriented to person, place, and time.     BP 106/68 (BP Location: Left Arm, Cuff Size: Normal)   Pulse 65   Temp 97.9 F (36.6 C) (Temporal)   Resp 12   Ht 6' (1.829 m)   Wt 170 lb 9.6 oz (77.4 kg)   SpO2 98%   BMI 23.14 kg/m  Wt Readings from Last 3 Encounters:  09/20/19 170 lb 9.6 oz (77.4 kg)  06/23/19 178 lb (80.7 kg)  06/20/19 172 lb (78 kg)    Diabetic Foot Exam - Simple   No data filed     Lab Results  Component Value Date   WBC 8.3 09/20/2019   HGB 14.9 09/20/2019   HCT 44.9 09/20/2019   PLT 249.0 09/20/2019   GLUCOSE 100 (H) 09/20/2019   CHOL 201 (H) 09/20/2019   TRIG 140.0 09/20/2019   HDL 39.70 09/20/2019   LDLDIRECT 158.0 03/11/2013   LDLCALC 134 (H) 09/20/2019   ALT 18 09/20/2019   AST 18 09/20/2019   NA 138 09/20/2019   K 4.7 09/20/2019   CL 103 09/20/2019   CREATININE 0.98 09/20/2019   BUN 14 09/20/2019   CO2 29 09/20/2019   TSH 0.43 09/20/2019   PSA 1.53 07/18/2016   INR 3.0 06/24/2019   HGBA1C 6.6 (H) 09/20/2019    Lab Results  Component Value Date   TSH 0.43 09/20/2019   Lab Results  Component Value Date   WBC 8.3 09/20/2019   HGB  14.9 09/20/2019   HCT 44.9 09/20/2019   MCV 88.0 09/20/2019   PLT 249.0 09/20/2019   Lab Results  Component Value Date   NA 138 09/20/2019   K 4.7 09/20/2019   CHLORIDE 109 11/16/2012   CO2 29 09/20/2019   GLUCOSE 100 (H) 09/20/2019   BUN 14 09/20/2019   CREATININE 0.98 09/20/2019   BILITOT 0.6 09/20/2019   ALKPHOS 57 09/20/2019   AST 18 09/20/2019   ALT 18 09/20/2019   PROT 7.0 09/20/2019   ALBUMIN 4.1 09/20/2019   CALCIUM 9.1 09/20/2019   ANIONGAP 8 09/11/2014   GFR 74.55 09/20/2019   Lab Results  Component Value Date   CHOL 201 (H) 09/20/2019   Lab Results  Component Value Date   HDL 39.70 09/20/2019   Lab Results  Component Value Date   LDLCALC 134 (H) 09/20/2019   Lab Results  Component Value Date   TRIG 140.0 09/20/2019   Lab Results  Component Value Date   CHOLHDL 5 09/20/2019   Lab Results  Component Value Date   HGBA1C 6.6 (H) 09/20/2019       Assessment & Plan:   Problem List Items Addressed This Visit    Hyperlipidemia    Tolerating statin, encouraged heart healthy diet, avoid trans fats, minimize simple carbs and saturated fats. Increase exercise as tolerated      Relevant Orders   Lipid panel (Completed)   Hypothyroid    On Levothyroxine, continue to monitor.  Relevant Orders   TSH (Completed)   Vitamin D deficiency    Supplement and monitor      Relevant Orders   VITAMIN D 25 Hydroxy (Vit-D Deficiency, Fractures) (Completed)   Hyperglycemia    hgba1c acceptable, minimize simple carbs. Increase exercise as tolerated. Continue current meds      Relevant Orders   Hemoglobin A1c (Completed)   Comprehensive metabolic panel (Completed)   Acid reflux - Primary   Relevant Orders   CBC (Completed)   MCI (mild cognitive impairment)    He is offered referral to neuropsychology for evaluation but he reports this is mild and not notably progressing. He will let us know if he is ready to proceed.          I am having Unknown Foley maintain his b complex vitamins, Cholecalciferol, Coenzyme Q10 (COQ10 PO), cetirizine, NON FORMULARY, Cranberry, enoxaparin, DULoxetine, Ascorbic Acid (VITAMIN C ER PO), warfarin, pantoprazole, rosuvastatin, thyroid, and thyroid. We will continue to administer triamcinolone acetonide.  No orders of the defined types were placed in this encounter.    Penni Homans, MD

## 2019-09-21 NOTE — Assessment & Plan Note (Signed)
He is offered referral to neuropsychology for evaluation but he reports this is mild and not notably progressing. He will let us know if he is ready to proceed.

## 2019-09-29 DIAGNOSIS — R4189 Other symptoms and signs involving cognitive functions and awareness: Secondary | ICD-10-CM | POA: Diagnosis not present

## 2019-09-29 DIAGNOSIS — G250 Essential tremor: Secondary | ICD-10-CM | POA: Diagnosis not present

## 2019-09-29 DIAGNOSIS — D6859 Other primary thrombophilia: Secondary | ICD-10-CM | POA: Diagnosis not present

## 2019-09-29 DIAGNOSIS — G629 Polyneuropathy, unspecified: Secondary | ICD-10-CM | POA: Diagnosis not present

## 2019-09-29 DIAGNOSIS — H532 Diplopia: Secondary | ICD-10-CM | POA: Diagnosis not present

## 2019-09-29 DIAGNOSIS — G459 Transient cerebral ischemic attack, unspecified: Secondary | ICD-10-CM | POA: Diagnosis not present

## 2019-09-30 ENCOUNTER — Other Ambulatory Visit: Payer: Self-pay | Admitting: Family Medicine

## 2019-09-30 ENCOUNTER — Encounter: Payer: Self-pay | Admitting: Family Medicine

## 2019-09-30 DIAGNOSIS — M353 Polymyalgia rheumatica: Secondary | ICD-10-CM

## 2019-09-30 DIAGNOSIS — M19041 Primary osteoarthritis, right hand: Secondary | ICD-10-CM

## 2019-09-30 DIAGNOSIS — M1990 Unspecified osteoarthritis, unspecified site: Secondary | ICD-10-CM

## 2019-10-04 ENCOUNTER — Encounter: Payer: Self-pay | Admitting: Family Medicine

## 2019-10-05 ENCOUNTER — Other Ambulatory Visit: Payer: Self-pay | Admitting: Family Medicine

## 2019-10-05 DIAGNOSIS — M2550 Pain in unspecified joint: Secondary | ICD-10-CM

## 2019-10-05 DIAGNOSIS — M7918 Myalgia, other site: Secondary | ICD-10-CM

## 2019-10-06 ENCOUNTER — Other Ambulatory Visit: Payer: Self-pay | Admitting: Family Medicine

## 2019-10-06 MED ORDER — PREDNISONE 20 MG PO TABS: ORAL_TABLET | ORAL | 1 refills | Status: DC

## 2019-10-14 ENCOUNTER — Encounter: Payer: Self-pay | Admitting: Family Medicine

## 2019-10-28 ENCOUNTER — Encounter: Payer: Self-pay | Admitting: Family Medicine

## 2019-10-30 ENCOUNTER — Other Ambulatory Visit: Payer: Self-pay | Admitting: Family Medicine

## 2019-10-30 MED ORDER — HYDROQUINONE 4 % EX CREA
TOPICAL_CREAM | Freq: Two times a day (BID) | CUTANEOUS | 1 refills | Status: DC
Start: 2019-10-30 — End: 2022-02-19

## 2019-10-31 ENCOUNTER — Other Ambulatory Visit: Payer: Self-pay | Admitting: Oncology

## 2019-10-31 DIAGNOSIS — I82509 Chronic embolism and thrombosis of unspecified deep veins of unspecified lower extremity: Secondary | ICD-10-CM

## 2019-11-07 ENCOUNTER — Encounter: Payer: Self-pay | Admitting: Oncology

## 2019-11-07 DIAGNOSIS — I2699 Other pulmonary embolism without acute cor pulmonale: Secondary | ICD-10-CM | POA: Diagnosis not present

## 2019-11-07 DIAGNOSIS — D6859 Other primary thrombophilia: Secondary | ICD-10-CM | POA: Diagnosis not present

## 2019-11-07 DIAGNOSIS — Z7901 Long term (current) use of anticoagulants: Secondary | ICD-10-CM | POA: Diagnosis not present

## 2019-11-10 ENCOUNTER — Encounter: Payer: Self-pay | Admitting: Oncology

## 2019-11-29 DIAGNOSIS — M50321 Other cervical disc degeneration at C4-C5 level: Secondary | ICD-10-CM | POA: Diagnosis not present

## 2019-11-29 DIAGNOSIS — R5382 Chronic fatigue, unspecified: Secondary | ICD-10-CM | POA: Diagnosis not present

## 2019-11-29 DIAGNOSIS — G609 Hereditary and idiopathic neuropathy, unspecified: Secondary | ICD-10-CM | POA: Insufficient documentation

## 2019-11-29 DIAGNOSIS — R739 Hyperglycemia, unspecified: Secondary | ICD-10-CM | POA: Diagnosis not present

## 2019-11-29 DIAGNOSIS — M25552 Pain in left hip: Secondary | ICD-10-CM | POA: Diagnosis not present

## 2019-11-29 DIAGNOSIS — M353 Polymyalgia rheumatica: Secondary | ICD-10-CM | POA: Diagnosis not present

## 2019-11-29 DIAGNOSIS — M160 Bilateral primary osteoarthritis of hip: Secondary | ICD-10-CM | POA: Diagnosis not present

## 2019-11-29 DIAGNOSIS — M19042 Primary osteoarthritis, left hand: Secondary | ICD-10-CM | POA: Diagnosis not present

## 2019-11-29 DIAGNOSIS — M47812 Spondylosis without myelopathy or radiculopathy, cervical region: Secondary | ICD-10-CM | POA: Diagnosis not present

## 2019-11-29 DIAGNOSIS — E039 Hypothyroidism, unspecified: Secondary | ICD-10-CM | POA: Diagnosis not present

## 2019-11-29 DIAGNOSIS — M2550 Pain in unspecified joint: Secondary | ICD-10-CM | POA: Diagnosis not present

## 2019-11-29 DIAGNOSIS — M79671 Pain in right foot: Secondary | ICD-10-CM | POA: Diagnosis not present

## 2019-11-29 DIAGNOSIS — M659 Synovitis and tenosynovitis, unspecified: Secondary | ICD-10-CM | POA: Diagnosis not present

## 2019-11-29 DIAGNOSIS — M5032 Other cervical disc degeneration, mid-cervical region, unspecified level: Secondary | ICD-10-CM | POA: Diagnosis not present

## 2019-11-29 DIAGNOSIS — M50322 Other cervical disc degeneration at C5-C6 level: Secondary | ICD-10-CM | POA: Diagnosis not present

## 2019-11-29 DIAGNOSIS — K219 Gastro-esophageal reflux disease without esophagitis: Secondary | ICD-10-CM | POA: Diagnosis not present

## 2019-11-29 DIAGNOSIS — M19012 Primary osteoarthritis, left shoulder: Secondary | ICD-10-CM | POA: Diagnosis not present

## 2019-11-29 DIAGNOSIS — M25551 Pain in right hip: Secondary | ICD-10-CM | POA: Diagnosis not present

## 2019-11-29 DIAGNOSIS — M19011 Primary osteoarthritis, right shoulder: Secondary | ICD-10-CM | POA: Diagnosis not present

## 2019-11-29 DIAGNOSIS — M79672 Pain in left foot: Secondary | ICD-10-CM | POA: Diagnosis not present

## 2019-11-29 DIAGNOSIS — G608 Other hereditary and idiopathic neuropathies: Secondary | ICD-10-CM | POA: Diagnosis not present

## 2019-11-29 DIAGNOSIS — M8949 Other hypertrophic osteoarthropathy, multiple sites: Secondary | ICD-10-CM | POA: Diagnosis not present

## 2019-11-29 DIAGNOSIS — M19041 Primary osteoarthritis, right hand: Secondary | ICD-10-CM | POA: Diagnosis not present

## 2019-11-29 DIAGNOSIS — Z7901 Long term (current) use of anticoagulants: Secondary | ICD-10-CM | POA: Diagnosis not present

## 2019-12-02 ENCOUNTER — Encounter: Payer: Self-pay | Admitting: Oncology

## 2019-12-05 DIAGNOSIS — K2100 Gastro-esophageal reflux disease with esophagitis, without bleeding: Secondary | ICD-10-CM | POA: Diagnosis not present

## 2019-12-05 DIAGNOSIS — R1310 Dysphagia, unspecified: Secondary | ICD-10-CM | POA: Diagnosis not present

## 2019-12-05 DIAGNOSIS — Z1212 Encounter for screening for malignant neoplasm of rectum: Secondary | ICD-10-CM | POA: Diagnosis not present

## 2019-12-05 DIAGNOSIS — Z1211 Encounter for screening for malignant neoplasm of colon: Secondary | ICD-10-CM | POA: Diagnosis not present

## 2019-12-06 ENCOUNTER — Encounter: Payer: Self-pay | Admitting: Family Medicine

## 2019-12-06 DIAGNOSIS — M659 Synovitis and tenosynovitis, unspecified: Secondary | ICD-10-CM | POA: Diagnosis not present

## 2019-12-06 DIAGNOSIS — M2550 Pain in unspecified joint: Secondary | ICD-10-CM | POA: Diagnosis not present

## 2019-12-06 DIAGNOSIS — E039 Hypothyroidism, unspecified: Secondary | ICD-10-CM | POA: Diagnosis not present

## 2019-12-06 DIAGNOSIS — M5412 Radiculopathy, cervical region: Secondary | ICD-10-CM | POA: Diagnosis not present

## 2019-12-06 DIAGNOSIS — R739 Hyperglycemia, unspecified: Secondary | ICD-10-CM | POA: Diagnosis not present

## 2019-12-06 DIAGNOSIS — Z7901 Long term (current) use of anticoagulants: Secondary | ICD-10-CM | POA: Diagnosis not present

## 2019-12-06 DIAGNOSIS — M8949 Other hypertrophic osteoarthropathy, multiple sites: Secondary | ICD-10-CM | POA: Diagnosis not present

## 2019-12-06 DIAGNOSIS — K219 Gastro-esophageal reflux disease without esophagitis: Secondary | ICD-10-CM | POA: Diagnosis not present

## 2019-12-06 DIAGNOSIS — G608 Other hereditary and idiopathic neuropathies: Secondary | ICD-10-CM | POA: Diagnosis not present

## 2019-12-07 ENCOUNTER — Other Ambulatory Visit: Payer: Self-pay | Admitting: Family Medicine

## 2019-12-07 DIAGNOSIS — R300 Dysuria: Secondary | ICD-10-CM

## 2019-12-07 MED ORDER — VALACYCLOVIR HCL 500 MG PO TABS: 2000.0000 mg | ORAL_TABLET | Freq: Two times a day (BID) | ORAL | 1 refills | Status: DC

## 2019-12-09 ENCOUNTER — Other Ambulatory Visit: Payer: Self-pay | Admitting: Rheumatology

## 2019-12-09 DIAGNOSIS — M5412 Radiculopathy, cervical region: Secondary | ICD-10-CM

## 2019-12-13 ENCOUNTER — Other Ambulatory Visit (INDEPENDENT_AMBULATORY_CARE_PROVIDER_SITE_OTHER): Payer: Medicare PPO

## 2019-12-13 DIAGNOSIS — R300 Dysuria: Secondary | ICD-10-CM | POA: Diagnosis not present

## 2019-12-13 LAB — URINALYSIS
Bilirubin Urine: NEGATIVE
Hgb urine dipstick: NEGATIVE
Ketones, ur: NEGATIVE
Leukocytes,Ua: NEGATIVE
Nitrite: NEGATIVE
Specific Gravity, Urine: 1.02 (ref 1.000–1.030)
Total Protein, Urine: NEGATIVE
Urine Glucose: NEGATIVE
Urobilinogen, UA: 0.2 (ref 0.0–1.0)
pH: 7 (ref 5.0–8.0)

## 2019-12-14 LAB — URINE CULTURE
MICRO NUMBER:: 11061198
SPECIMEN QUALITY:: ADEQUATE

## 2019-12-25 LAB — POCT INR: INR: 3 (ref 2.0–3.0)

## 2019-12-31 ENCOUNTER — Ambulatory Visit
Admission: RE | Admit: 2019-12-31 | Discharge: 2019-12-31 | Disposition: A | Discharge status: Home / Self Care | Payer: Medicare PPO | Source: Ambulatory Visit | Attending: Rheumatology | Admitting: Rheumatology

## 2019-12-31 ENCOUNTER — Other Ambulatory Visit: Payer: Self-pay

## 2019-12-31 DIAGNOSIS — M4802 Spinal stenosis, cervical region: Secondary | ICD-10-CM | POA: Diagnosis not present

## 2019-12-31 DIAGNOSIS — M5412 Radiculopathy, cervical region: Secondary | ICD-10-CM

## 2020-01-03 DIAGNOSIS — M5412 Radiculopathy, cervical region: Secondary | ICD-10-CM | POA: Diagnosis not present

## 2020-01-03 DIAGNOSIS — M8949 Other hypertrophic osteoarthropathy, multiple sites: Secondary | ICD-10-CM | POA: Diagnosis not present

## 2020-01-03 DIAGNOSIS — D6859 Other primary thrombophilia: Secondary | ICD-10-CM | POA: Diagnosis not present

## 2020-01-03 DIAGNOSIS — Z7901 Long term (current) use of anticoagulants: Secondary | ICD-10-CM | POA: Diagnosis not present

## 2020-01-03 DIAGNOSIS — E559 Vitamin D deficiency, unspecified: Secondary | ICD-10-CM | POA: Diagnosis not present

## 2020-01-03 DIAGNOSIS — K219 Gastro-esophageal reflux disease without esophagitis: Secondary | ICD-10-CM | POA: Diagnosis not present

## 2020-01-03 DIAGNOSIS — E039 Hypothyroidism, unspecified: Secondary | ICD-10-CM | POA: Diagnosis not present

## 2020-01-03 DIAGNOSIS — M659 Synovitis and tenosynovitis, unspecified: Secondary | ICD-10-CM | POA: Diagnosis not present

## 2020-01-03 DIAGNOSIS — F5101 Primary insomnia: Secondary | ICD-10-CM | POA: Diagnosis not present

## 2020-01-11 ENCOUNTER — Other Ambulatory Visit: Payer: Self-pay | Admitting: Family Medicine

## 2020-01-14 ENCOUNTER — Encounter: Payer: Self-pay | Admitting: Family Medicine

## 2020-01-17 DIAGNOSIS — L814 Other melanin hyperpigmentation: Secondary | ICD-10-CM | POA: Diagnosis not present

## 2020-01-17 DIAGNOSIS — L821 Other seborrheic keratosis: Secondary | ICD-10-CM | POA: Diagnosis not present

## 2020-01-17 DIAGNOSIS — L720 Epidermal cyst: Secondary | ICD-10-CM | POA: Diagnosis not present

## 2020-01-17 DIAGNOSIS — D225 Melanocytic nevi of trunk: Secondary | ICD-10-CM | POA: Diagnosis not present

## 2020-01-17 DIAGNOSIS — L820 Inflamed seborrheic keratosis: Secondary | ICD-10-CM | POA: Diagnosis not present

## 2020-01-17 DIAGNOSIS — D1801 Hemangioma of skin and subcutaneous tissue: Secondary | ICD-10-CM | POA: Diagnosis not present

## 2020-01-21 ENCOUNTER — Encounter: Payer: Self-pay | Admitting: Oncology

## 2020-01-21 DIAGNOSIS — D6859 Other primary thrombophilia: Secondary | ICD-10-CM | POA: Diagnosis not present

## 2020-01-21 DIAGNOSIS — I2699 Other pulmonary embolism without acute cor pulmonale: Secondary | ICD-10-CM | POA: Diagnosis not present

## 2020-01-21 DIAGNOSIS — Z7901 Long term (current) use of anticoagulants: Secondary | ICD-10-CM | POA: Diagnosis not present

## 2020-01-22 LAB — POCT INR: INR: 3.1 — AB (ref 2.0–3.0)

## 2020-02-01 DIAGNOSIS — F5101 Primary insomnia: Secondary | ICD-10-CM | POA: Diagnosis not present

## 2020-02-01 DIAGNOSIS — E559 Vitamin D deficiency, unspecified: Secondary | ICD-10-CM | POA: Diagnosis not present

## 2020-02-01 DIAGNOSIS — G8929 Other chronic pain: Secondary | ICD-10-CM | POA: Diagnosis not present

## 2020-02-01 DIAGNOSIS — M5030 Other cervical disc degeneration, unspecified cervical region: Secondary | ICD-10-CM | POA: Diagnosis not present

## 2020-02-01 DIAGNOSIS — M8949 Other hypertrophic osteoarthropathy, multiple sites: Secondary | ICD-10-CM | POA: Diagnosis not present

## 2020-02-01 DIAGNOSIS — M659 Synovitis and tenosynovitis, unspecified: Secondary | ICD-10-CM | POA: Diagnosis not present

## 2020-02-01 DIAGNOSIS — K219 Gastro-esophageal reflux disease without esophagitis: Secondary | ICD-10-CM | POA: Diagnosis not present

## 2020-02-01 DIAGNOSIS — M353 Polymyalgia rheumatica: Secondary | ICD-10-CM | POA: Diagnosis not present

## 2020-02-01 DIAGNOSIS — E039 Hypothyroidism, unspecified: Secondary | ICD-10-CM | POA: Diagnosis not present

## 2020-02-01 DIAGNOSIS — Z7901 Long term (current) use of anticoagulants: Secondary | ICD-10-CM | POA: Diagnosis not present

## 2020-02-01 DIAGNOSIS — M5412 Radiculopathy, cervical region: Secondary | ICD-10-CM | POA: Diagnosis not present

## 2020-02-16 LAB — POCT INR: INR: 3 (ref 2.0–3.0)

## 2020-02-21 DIAGNOSIS — I672 Cerebral atherosclerosis: Secondary | ICD-10-CM | POA: Diagnosis not present

## 2020-02-21 DIAGNOSIS — E039 Hypothyroidism, unspecified: Secondary | ICD-10-CM | POA: Diagnosis not present

## 2020-02-21 DIAGNOSIS — G4733 Obstructive sleep apnea (adult) (pediatric): Secondary | ICD-10-CM | POA: Diagnosis not present

## 2020-02-23 ENCOUNTER — Encounter: Payer: Self-pay | Admitting: Oncology

## 2020-02-24 LAB — POCT INR: INR: 3 (ref 2.0–3.0)

## 2020-03-06 DIAGNOSIS — M5412 Radiculopathy, cervical region: Secondary | ICD-10-CM | POA: Diagnosis not present

## 2020-03-06 DIAGNOSIS — M542 Cervicalgia: Secondary | ICD-10-CM | POA: Diagnosis not present

## 2020-03-06 DIAGNOSIS — G8929 Other chronic pain: Secondary | ICD-10-CM | POA: Diagnosis not present

## 2020-03-07 ENCOUNTER — Ambulatory Visit: Payer: Medicare PPO | Attending: Rheumatology | Admitting: Physical Therapy

## 2020-03-07 ENCOUNTER — Other Ambulatory Visit: Payer: Self-pay

## 2020-03-07 ENCOUNTER — Encounter: Payer: Self-pay | Admitting: Physical Therapy

## 2020-03-07 DIAGNOSIS — M6281 Muscle weakness (generalized): Secondary | ICD-10-CM | POA: Diagnosis not present

## 2020-03-07 DIAGNOSIS — M542 Cervicalgia: Secondary | ICD-10-CM | POA: Diagnosis not present

## 2020-03-07 NOTE — Patient Instructions (Signed)

## 2020-03-07 NOTE — Therapy (Signed)
[facility] [address] [place], [place], [address] Phone: [phone]   Fax:  [phone]  Physical Therapy Evaluation  Patient Details  Name: Paul Ryan MRN: [id number] Date of Birth: 1945-03-05 Referring Provider (PT): Luanna Salk   Encounter Date: 03/07/2020   PT End of Session - 03/07/20 0815    Visit Number 1    Date for PT Re-Evaluation 05/02/20    Authorization Type [employer]    Authorization - Visit Number 1    Authorization - Number of Visits 12    Progress Note Due on Visit 10    PT Start Time 0802    PT Stop Time 0843    PT Time Calculation (min) 41 min    Activity Tolerance Patient tolerated treatment well    Behavior During Therapy [facility] for tasks assessed/performed           Past Medical History:  Diagnosis Date  . Acid reflux 02/03/2016  . Allergy    seasonal  . Anxiety 12/01/2016  . Chronic anticoagulation 06/14/2013  . Coagulopathy ([place]) 05/16/2011  . Collagen vascular disease ([place])   . Cough 12/01/2016  . DVT (deep venous thrombosis) ([name])    takes coumadin  . Dysuria 02/27/2013  . Erectile dysfunction 12/28/2016  . 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 Hyperglycemia 12/26/2014  . Hyperlipidemia   . Hypothyroid 06/10/2011  . Insomnia 04/24/2016  . Left hip pain 12/17/2010  . MCI (mild cognitive impairment) 07/22/2016  . Nasal lesion 07/22/2016  . Polymyalgia ([place]) 03/21/2014  . 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 Preventative health care 12/17/2010  . Shingles 1982   right abdominal wall  . Staph skin infection 07/22/2016  . Thyroid nodule 11/29/2014  . Urinary hesitancy 09/30/2016  . Vitamin D deficiency 10/14/2011    Past Surgical History:  Procedure Laterality Date  . 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    Subjective Assessment - 03/07/20 0804    Subjective Pt is referred to  OPPT with ongoing bil UE weakness Lt>Rt in shoulders and hands as well as chronic neck pain.  He had EMG yesterday and verbally reported that MD believes a pinched nerve is contributing to UE weakness.  Pt has Lt shoulder pain that prevents him from laying on Lt side.  Pt also has PMR which affects full body.    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 Yes    Pain Score 2    can reach 9/10   Pain Location Arm    Pain Orientation Right;Left    Pain Descriptors / Indicators Aching;Other (Comment);Dull   weakness   Pain Type Chronic pain    Pain Radiating Towards along posterior arms into bil forearms, wrist and bil UEs    Pain Onset More than a month ago    Pain Frequency Intermittent    Aggravating Factors  neck pain with attempts to weight lift for UEs, no pattern for arms - just comes/goes    Pain Relieving Factors meds - predisone, Gabapentin              [facility] PT Assessment - 03/07/20 0001  Assessment   Medical Diagnosis M35.3 (ICD-10-CM) - Polymyalgia rheumatica ([name]) M19.90 (ICD-10-CM) - Osteoarthritis, unspecified osteoarthritis type, unspecified site    Referring Provider (PT) Marylyn Ishihara, Adela Glimpse    Onset Date/Surgical Date --   1 year ago worsened   Hand Dominance Right    Next MD Visit as needed    Prior Therapy for his back      Precautions   Precautions None      Restrictions   Weight Bearing Restrictions No      Balance Screen   Has the patient fallen in the past 6 months No      [place] residence    Living Arrangements Spouse/significant other    Type of [place]      Prior Function   Level of Independence Independent    Vocation Retired    Product/process development scientist, used to Horticulturist, commercial at [employer], does Teacher, English as a foreign language    Leisure sculpts, walk 3 miles/day, read, Banker   Overall Cognitive Status Within Functional Limits for tasks assessed      Posture/Postural Control   Posture/Postural Control Postural limitations    Postural Limitations Increased thoracic kyphosis;Decreased lumbar lordosis    Posture Comments holds head in slight extension      ROM / Strength   AROM / PROM / Strength AROM;Strength      AROM   Overall AROM Comments restricted but no pain A/ROM neck, bil shoulders WFL with end range stiffness w/o pain flexion and abduction    AROM Assessment Site Cervical;Shoulder    Cervical Flexion 45    Cervical Extension 45    Cervical - Right Side Bend 25    Cervical - Left Side Bend 25    Cervical - Right Rotation 35    Cervical - Left Rotation 35      Strength   Overall Strength Comments cervical 5/5, wrists 5/5, bil shoulder abduction 4/5, elbow flexion 4/5, Lt thumb abduction 4-/5, grip: Lt 55lb, Rt 75lb, pincer grip Lt 16lb, Rt 21lb      Palpation   Spinal mobility limited upglides and sideglides mid-lower cervical facets and U-joints Lt>Rt, limited PAs upper t-spine    Palpation comment non-tender ropey bil upper traps, non-tender throughout c-spine, t-spine, pectorals, upper traps      Special Tests    Special Tests Cervical    Cervical Tests Dictraction      Distraction Test   Findngs Positive    Comment for relief                      Objective measurements completed on examination: See above findings.               PT Education - 03/07/20 [id number]    Education Details DN info    Person(s) Educated Patient    Methods Explanation;Handout    Comprehension Verbalized understanding            PT Short Term Goals - 03/07/20 1250      PT SHORT TERM GOAL #1   Title Pt will be ind with initial HEP    Time 3    Period Weeks    Status New    Target Date 03/28/20      PT SHORT TERM GOAL #2   Title Pt will improve  bil neck rotation to 45 deg    Time 3  Period Weeks    Status New    Target Date 03/28/20      PT SHORT TERM GOAL #3   Title Pt will report reduced neck pain by at least 20%    Time 4    Period Weeks    Status New    Target Date 04/04/20             PT Long Term Goals - 03/07/20 1251      PT LONG TERM GOAL #1   Title Pt will be ind with advanced HEP and understand how to safely progress    Time 8    Period Weeks    Status New    Target Date 05/02/20      PT LONG TERM GOAL #2   Title Pt will improve bil neck rotation to at least 55 deg and flexion/ext to at least 55 each for improved functional visibility with driving and sculpting projects    Time 8    Period Weeks    Status New    Target Date 05/02/20      PT LONG TERM GOAL #3   Title Pt will improve Lt grip strength to at least 75lb and Lt pincer grip to at least 22 to reach symmetrical strength with Rt side    Baseline Lt grip 75lb, Lt pincer 16lb    Time 8    Period Weeks    Status New    Target Date 05/02/20      PT LONG TERM GOAL #4   Title Pt will report at least 70% reduction in pain thorughout day    Time 8    Period Weeks    Status New    Target Date 05/02/20      PT LONG TERM GOAL #5   Title Pt will achieve at least 4+/5 strength in bil biceps adn shoulder abductors 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    Time 8    Period Weeks    Status New    Target Date 05/02/20                  Plan - 03/07/20 0840    Clinical Impression Statement Pt is a pleasant right-handed 75yo with history of polymyalgia rheumatica.  He has chronic neck pain and Lt>Rt UE weakness from shoulders to hands.  Verbal report by patient of an EMG performed yesterday states that they found evidence of nerve compression in neck feeding into UE weakness.  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  He presents with signif limitations in  bil cervical rotation (35 deg) without pain.  Flexion and extension are 45 deg and bil SB is 25 deg, also painfree.  A/ROM of both cervical region and bil shoulders is painfree.  Shoulder ROM is WNL with end range stiffness without pain with flexion and abduction.  Pt has weakness in bil UEs for biceps, shoulder abduction, grip and pincer strength Lt>Rt.  Pt has limited spinal mobility in mid and lower cspine along Lt U-joints and bil facets, as well as poor PA glides in upper thoracic spine.  Pt with relief with manual traction trial today.  Pt is non-tender but has latent TPs in bil upper traps and cervical and upper thoracic multifidi.  DN info given as he is a good candidate to help improve mobility.  Pt will benefit from skilled PT to address ROM,  strength and pain to improve performance of daily demands.    Personal Factors and Comorbidities Comorbidity 1;Profession    Comorbidities PMR, sculptor needing use of bil UEs    Examination-Activity Limitations Carry;Lift;Sleep    Examination-Participation Restrictions Community Activity;Occupation    Stability/Clinical Decision Making Stable/Uncomplicated    Clinical Decision Making Low    Rehab Potential Good    PT Frequency 2x / week    PT Duration 8 weeks    PT Treatment/Interventions ADLs/Self Care Home Management;Cryotherapy;Electrical Stimulation;Moist Heat;Traction;Neuromuscular re-education;Therapeutic exercise;Therapeutic activities;Functional mobility training;Patient/family education;Manual techniques;Dry needling;Passive range of motion;Taping;Spinal Manipulations;Joint Manipulations    PT Next Visit Plan start HEP to include neck ROM and bicep, abd, grip and pincer strength ther ex, cervical traction, DN bil upper traps and cervical multifidi    PT Home Exercise Plan DN info    Consulted and Agree with Plan of Care Patient           Patient will benefit from skilled therapeutic intervention in order to improve the following deficits  and impairments:  Decreased range of motion,Impaired UE functional use,Increased muscle spasms,Postural dysfunction,Decreased strength,Decreased mobility,Impaired flexibility,Pain,Decreased activity tolerance,Decreased endurance  Visit Diagnosis: Cervicalgia - Plan: PT plan of care cert/re-cert  Muscle weakness (generalized) - Plan: PT plan of care cert/re-cert     Problem List Patient Active Problem List   Diagnosis Date Noted  . Olecranon bursitis of right elbow 05/19/2019  . Yeast infection of the skin 11/05/2017  . Flank pain 11/05/2017  . Urethritis 06/22/2017  . Memory loss 06/22/2017  . Foot pain, right 06/16/2017  . Erectile dysfunction 12/28/2016  . Cough 12/01/2016  . Anxiety 12/01/2016  . Urinary hesitancy 09/30/2016  . MCI (mild cognitive impairment) 07/22/2016  . Staph skin infection 07/22/2016  . Insomnia 04/24/2016  . Urinary frequency 04/24/2016  . Acid reflux 02/03/2016  . Sinusitis 01/29/2016  . 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 Dyspnea 12/29/2014  . Hyperglycemia 12/26/2014  . Pulmonary embolism ([name]) 08/07/2014  . Polymyalgia rheumatica ([name]) 04/25/2014  . Chronic anticoagulation 06/14/2013  . Leukocytosis 05/29/2013  . Greater trochanteric bursitis of right hip 05/16/2013  . Hip pain, right 05/01/2013  . Vitamin D deficiency 10/14/2011  . Hypothyroid 06/10/2011  . Coagulopathy ([name]) 05/16/2011  . 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 Left hip pain 12/17/2010  . Preventative health care 12/17/2010  . DVT (deep venous thrombosis) ([name])   . Allergic state   . Hyperlipidemia     Morton Peters, PT 03/07/20 12:57 PM   [facility] [address] [place], [place], [address] Phone: [phone]   Fax:  [phone]  Name: Diaz Crago MRN: [id number] Date of Birth: Jan 13, 1946

## 2020-03-09 ENCOUNTER — Encounter: Payer: Self-pay | Admitting: Physical Therapy

## 2020-03-09 ENCOUNTER — Ambulatory Visit: Payer: Medicare PPO | Admitting: Physical Therapy

## 2020-03-09 ENCOUNTER — Other Ambulatory Visit: Payer: Self-pay

## 2020-03-09 DIAGNOSIS — M6281 Muscle weakness (generalized): Secondary | ICD-10-CM

## 2020-03-09 DIAGNOSIS — M542 Cervicalgia: Secondary | ICD-10-CM | POA: Diagnosis not present

## 2020-03-09 NOTE — Therapy (Addendum)
[facility] [address] [place], [place], [address] Phone: [phone]   Fax:  [phone]  Physical Therapy Treatment  Patient Details  Name: Paul Ryan MRN: [id number] Date of Birth: 23-Mar-1945 Referring Provider (PT): Toney Rakes   Encounter Date: 03/09/2020   PT End of Session - 03/09/20 1143    Visit Number 2    Date for PT Re-Evaluation 05/02/20    Authorization Type [employer]    Authorization - Visit Number 2    Authorization - Number of Visits 12    Progress Note Due on Visit 10    PT Start Time 1057    PT Stop Time 1139    PT Time Calculation (min) 42 min    Activity Tolerance Patient tolerated treatment well    Behavior During Therapy [facility] for tasks assessed/performed           Past Medical History:  Diagnosis Date  . Acid reflux 02/03/2016  . Allergy    seasonal  . Anxiety 12/01/2016  . Chronic anticoagulation 06/14/2013  . Coagulopathy ([name]) 05/16/2011  . Collagen vascular disease ([name])   . Cough 12/01/2016  . DVT (deep venous thrombosis) ([name])    takes coumadin  . Dysuria 02/27/2013  . Erectile dysfunction 12/28/2016  . 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 Hyperglycemia 12/26/2014  . Hyperlipidemia   . Hypothyroid 06/10/2011  . Insomnia 04/24/2016  . Left hip pain 12/17/2010  . MCI (mild cognitive impairment) 07/22/2016  . Nasal lesion 07/22/2016  . Polymyalgia ([name]) 03/21/2014  . 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 Preventative health care 12/17/2010  . Shingles 1982   right abdominal wall  . Staph skin infection 07/22/2016  . Thyroid nodule 11/29/2014  . Urinary hesitancy 09/30/2016  . Vitamin D deficiency 10/14/2011    Past Surgical History:  Procedure Laterality Date  . 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   Subjective Assessment - 03/11/20 2026    Subjective Pt states he is  okay. Over the years just lived with nagging neck. Pt denies pain currently    Pertinent History PMR    Limitations Lifting;House hold activities    Patient Stated Goals be able to keep sculpting, learn exercises for mobility and strength, and reduce pain    Currently in Pain? No/denies                             [facility] Adult PT Treatment/Exercise - 03/11/20 0001      Neuro Re-ed    Neuro Re-ed Details  --   breathing with ribcage and into belly supine and sitting; sitting posture with core stability and relaxing shoulders while doing cervical ROM, chin tuck, thoracic mobility - all seen in HEP given today     Manual Therapy   Manual Therapy Soft tissue mobilization;Myofascial release    Soft tissue mobilization upper traps, cervical and thoracic paraspinals    Myofascial Release suboccipital release            Trigger Point Dry Needling - 03/11/20 0001    Consent Given? Yes    Education Handout Provided Yes    Muscles Treated Head and Neck Upper trapezius                PT Education - 03/11/20 2032    Education Details  Access Code: [email or web address]    Person(s) Educated Patient    Methods Explanation;Demonstration;Tactile cues;Verbal cues;Handout    Comprehension Verbalized understanding;Returned demonstration            PT Short Term Goals - 03/09/20 1143      PT SHORT TERM GOAL #1   Title Pt will be ind with initial HEP    Baseline issued today             PT Long Term Goals - 03/07/20 1251      PT LONG TERM GOAL #1   Title Pt will be ind with advanced HEP and understand how to safely progress    Time 8    Period Weeks    Status New    Target Date 05/02/20      PT LONG TERM GOAL #2   Title Pt will improve bil neck rotation to at least 55 deg and flexion/ext to at least 55 each for improved functional visibility with driving and sculpting projects    Time 8    Period Weeks    Status New    Target Date 05/02/20      PT LONG TERM GOAL  #3   Title Pt will improve Lt grip strength to at least 75lb and Lt pincer grip to at least 22 to reach symmetrical strength with Rt side    Baseline Lt grip 75lb, Lt pincer 16lb    Time 8    Period Weeks    Status New    Target Date 05/02/20      PT LONG TERM GOAL #4   Title Pt will report at least 70% reduction in pain thorughout day    Time 8    Period Weeks    Status New    Target Date 05/02/20      PT LONG TERM GOAL #5   Title Pt will achieve at least 4+/5 strength in bil biceps adn shoulder abductors 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    Time 8    Period Weeks    Status New    Target Date 05/02/20                 Plan - 03/09/20 1145    Clinical Impression Statement Pt at initial follow up visit.  Pt was given cervical ROM and retractions with cues for posture.  Pt responded well to postural cues.  Pt had twitch response in upper traps with DN. Pt will benefit from skilled PT to continue working towards functional goals.    PT Treatment/Interventions ADLs/Self Care Home Management;Cryotherapy;Electrical Stimulation;Moist Heat;Traction;Neuromuscular re-education;Therapeutic exercise;Therapeutic activities;Functional mobility training;Patient/family education;Manual techniques;Dry needling;Passive range of motion;Taping;Spinal Manipulations;Joint Manipulations    PT Next Visit Plan thoracic flexion and ext on the pball; W, Y, T; biceps and abduction    Consulted and Agree with Plan of Care Patient           Patient will benefit from skilled therapeutic intervention in order to improve the following deficits and impairments:  Decreased range of motion,Impaired UE functional use,Increased muscle spasms,Postural dysfunction,Decreased strength,Decreased mobility,Impaired flexibility,Pain,Decreased activity tolerance,Decreased endurance  Visit Diagnosis: Cervicalgia  Muscle weakness (generalized)     Problem List Patient Active Problem List   Diagnosis  Date Noted  . Olecranon bursitis of right elbow 05/19/2019  . Yeast infection of the skin 11/05/2017  . Flank pain 11/05/2017  . Urethritis 06/22/2017  . Memory loss 06/22/2017  . Foot pain, right 06/16/2017  . Erectile dysfunction 12/28/2016  .  Cough 12/01/2016  . Anxiety 12/01/2016  . Urinary hesitancy 09/30/2016  . MCI (mild cognitive impairment) 07/22/2016  . Staph skin infection 07/22/2016  . Insomnia 04/24/2016  . Urinary frequency 04/24/2016  . Acid reflux 02/03/2016  . Sinusitis 01/29/2016  . 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 Dyspnea 12/29/2014  . Hyperglycemia 12/26/2014  . Pulmonary embolism ([name]) 08/07/2014  . Polymyalgia rheumatica ([name]) 04/25/2014  . Chronic anticoagulation 06/14/2013  . Leukocytosis 05/29/2013  . Greater trochanteric bursitis of right hip 05/16/2013  . Hip pain, right 05/01/2013  . Vitamin D deficiency 10/14/2011  . Hypothyroid 06/10/2011  . Coagulopathy ([name]) 05/16/2011  . 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 Left hip pain 12/17/2010  . Preventative health care 12/17/2010  . DVT (deep venous thrombosis) ([name])   . Allergic state   . Hyperlipidemia     Junious Silk, PT 03/11/2020, 8:32 PM  [facility] [address], [address] [place], [place], [address] Phone: [phone]   Fax:  [phone]  Name: Paul Ryan MRN: [id number] Date of Birth: 09-Feb-1946

## 2020-03-11 NOTE — Patient Instructions (Signed)
Access Code: [email or web address] URL: [email or web address] Date: 03/11/2020 Prepared by: Jari Favre  Exercises Thoracic Extension Mobilization with Noodle - 1 x daily - 7 x weekly - 1 sets - 10 reps - 5 hold Open Book Chest Stretch on Towel Roll - 1 x daily - 7 x weekly - 1 sets - 10 reps - 5 hold Supine Chin Tuck - 3 x daily - 7 x weekly - 1 sets - 10 reps - 5 hold Seated Cervical Rotation AROM - 3 x daily - 7 x weekly - 1 sets - 10 reps - 5 hold Seated Cervical Sidebending AROM - 3 x daily - 7 x weekly - 1 sets - 10 reps - 5 hold

## 2020-03-13 ENCOUNTER — Other Ambulatory Visit: Payer: Self-pay

## 2020-03-13 ENCOUNTER — Encounter: Payer: Self-pay | Admitting: Physical Therapy

## 2020-03-13 ENCOUNTER — Ambulatory Visit: Payer: Medicare PPO | Admitting: Physical Therapy

## 2020-03-13 DIAGNOSIS — M6281 Muscle weakness (generalized): Secondary | ICD-10-CM

## 2020-03-13 DIAGNOSIS — M542 Cervicalgia: Secondary | ICD-10-CM

## 2020-03-13 NOTE — Therapy (Signed)
[facility] [address] [place], [place], [address] Phone: [phone]   Fax:  [phone]  Physical Therapy Treatment  Patient Details  Name: Paul Ryan MRN: [id number] Date of Birth: 05-16-1945 Referring Provider (PT): Luanna Salk   Encounter Date: 03/13/2020   PT End of Session - 03/13/20 1204    Visit Number 3    Date for PT Re-Evaluation 05/02/20    Authorization Type [employer]    Authorization - Visit Number 3    Authorization - Number of Visits 12    Progress Note Due on Visit 10    PT Start Time 1102    PT Stop Time 1143    PT Time Calculation (min) 41 min    Activity Tolerance Patient tolerated treatment well    Behavior During Therapy [facility] for tasks assessed/performed           Past Medical History:  Diagnosis Date  . Acid reflux 02/03/2016  . Allergy    seasonal  . Anxiety 12/01/2016  . Chronic anticoagulation 06/14/2013  . Coagulopathy ([place]) 05/16/2011  . Collagen vascular disease ([place])   . Cough 12/01/2016  . DVT (deep venous thrombosis) ([name])    takes coumadin  . Dysuria 02/27/2013  . Erectile dysfunction 12/28/2016  . Hereditary protein S deficiency ([place]) 05/16/2011  . Hyperglycemia 12/26/2014  . Hyperlipidemia   . Hypothyroid 06/10/2011  . Insomnia 04/24/2016  . Left hip pain 12/17/2010  . MCI (mild cognitive impairment) 07/22/2016  . Nasal lesion 07/22/2016  . Polymyalgia ([place]) 03/21/2014  . 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 Preventative health care 12/17/2010  . Shingles 1982   right abdominal wall  . Staph skin infection 07/22/2016  . Thyroid nodule 11/29/2014  . Urinary hesitancy 09/30/2016  . Vitamin D deficiency 10/14/2011    Past Surgical History:  Procedure Laterality Date  . 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   Subjective Assessment - 03/13/20 1104    Subjective Pt states there was  discomfort after doing the stretches but didn't last long    Diagnostic tests MRI, EMG    Patient Stated Goals be able to keep sculpting, learn exercises for mobility and strength, and reduce pain    Currently in Pain? No/denies                             [facility] Adult PT Treatment/Exercise - 03/13/20 0001      Self-Care   Self-Care Other Self-Care Comments    Other Self-Care Comments  finger extension exercise      Neck Exercises: Seated   Postural Training posture while doing UBE backwards 5 min L1.5    Other Seated Exercise side lying thoracic rotation      Neck Exercises: Supine   Cervical Rotation 5 reps;Both    Lateral Flexion 5 reps;Both    Other Supine Exercise goal post stretch with towel; thoracic ext with towel      Manual Therapy   Soft tissue mobilization thoracic paraspinals            Trigger Point Dry Needling - 03/13/20 0001    Consent Given? Yes    Education Handout Provided Previously provided    Muscles Treated Back/Hip Thoracic multifidi    Thoracic multifidi response Twitch response elicited;Palpable increased muscle length  PT Education - 03/13/20 1149    Education Details Access Code: [email or web address]    Person(s) Educated Patient    Methods Explanation;Demonstration;Tactile cues;Verbal cues;Handout    Comprehension Verbalized understanding;Returned demonstration            PT Short Term Goals - 03/13/20 1750      PT SHORT TERM GOAL #1   Title Pt will be ind with initial HEP    Status Achieved             PT Long Term Goals - 03/07/20 1251      PT LONG TERM GOAL #1   Title Pt will be ind with advanced HEP and understand how to safely progress    Time 8    Period Weeks    Status New    Target Date 05/02/20      PT LONG TERM GOAL #2   Title Pt will improve bil neck rotation to at least 55 deg and flexion/ext to at least 55 each for improved functional visibility with driving and sculpting projects     Time 8    Period Weeks    Status New    Target Date 05/02/20      PT LONG TERM GOAL #3   Title Pt will improve Lt grip strength to at least 75lb and Lt pincer grip to at least 22 to reach symmetrical strength with Rt side    Baseline Lt grip 75lb, Lt pincer 16lb    Time 8    Period Weeks    Status New    Target Date 05/02/20      PT LONG TERM GOAL #4   Title Pt will report at least 70% reduction in pain thorughout day    Time 8    Period Weeks    Status New    Target Date 05/02/20      PT LONG TERM GOAL #5   Title Pt will achieve at least 4+/5 strength in bil biceps adn shoulder abductors 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    Time 8    Period Weeks    Status New    Target Date 05/02/20                 Plan - 03/13/20 1745    Clinical Impression Statement Pt responded well to STM and dry needling to thoracic multifidi with increased STlength. Pt with increased tension of lt scalenes also released well with STM.  Today's session reviewed thoracic mobility and added rotation.  Also added strengthening for finger extensors for improved muscle balance with grip strength.  Pt will benefit from skilled PT to continue to address posture for improved cervcial ROM.    PT Treatment/Interventions ADLs/Self Care Home Management;Cryotherapy;Electrical Stimulation;Moist Heat;Traction;Neuromuscular re-education;Therapeutic exercise;Therapeutic activities;Functional mobility training;Patient/family education;Manual techniques;Dry needling;Passive range of motion;Taping;Spinal Manipulations;Joint Manipulations    PT Next Visit Plan thoracic flexion and ext on the pball; W, Y, T; biceps and abduction    PT Home Exercise Plan Access Code: [email or web address] [email or web address]    Consulted and Agree with Plan of Care Patient           Patient will benefit from skilled therapeutic intervention in order to improve the following deficits and impairments:  Decreased range of motion,Impaired UE  functional use,Increased muscle spasms,Postural dysfunction,Decreased strength,Decreased mobility,Impaired flexibility,Pain,Decreased activity tolerance,Decreased endurance  Visit Diagnosis: Cervicalgia  Muscle weakness (generalized)     Problem List Patient Active Problem List   Diagnosis Date Noted  .  Olecranon bursitis of right elbow 05/19/2019  . Yeast infection of the skin 11/05/2017  . Flank pain 11/05/2017  . Urethritis 06/22/2017  . Memory loss 06/22/2017  . Foot pain, right 06/16/2017  . Erectile dysfunction 12/28/2016  . Cough 12/01/2016  . Anxiety 12/01/2016  . Urinary hesitancy 09/30/2016  . MCI (mild cognitive impairment) 07/22/2016  . Staph skin infection 07/22/2016  . Insomnia 04/24/2016  . Urinary frequency 04/24/2016  . Acid reflux 02/03/2016  . Sinusitis 01/29/2016  . 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 Dyspnea 12/29/2014  . Hyperglycemia 12/26/2014  . Pulmonary embolism ([place]) 08/07/2014  . Polymyalgia rheumatica ([place]) 04/25/2014  . Chronic anticoagulation 06/14/2013  . Leukocytosis 05/29/2013  . Greater trochanteric bursitis of right hip 05/16/2013  . Hip pain, right 05/01/2013  . Vitamin D deficiency 10/14/2011  . Hypothyroid 06/10/2011  . Coagulopathy ([place]) 05/16/2011  . Hereditary protein S deficiency ([place]) 05/16/2011  . Left hip pain 12/17/2010  . Preventative health care 12/17/2010  . DVT (deep venous thrombosis) ([place])   . Allergic state   . Hyperlipidemia     Jule Ser, PT 03/13/2020, 5:53 PM  [facility] [address], [place], [place], [address] Phone: [phone]   Fax:  [phone]  Name: Paul Ryan MRN: [id number] Date of Birth: August 06, 1945

## 2020-03-13 NOTE — Patient Instructions (Signed)
Access Code: [email or web address] URL: [email or web address] Date: 03/13/2020 Prepared by: Jari Favre  Exercises Thoracic Extension Mobilization with Noodle - 1 x daily - 7 x weekly - 1 sets - 10 reps - 5 hold Open Book Chest Stretch on Towel Roll - 1 x daily - 7 x weekly - 1 sets - 10 reps - 5 hold Supine Chin Tuck - 3 x daily - 7 x weekly - 1 sets - 10 reps - 5 hold Seated Cervical Rotation AROM - 3 x daily - 7 x weekly - 1 sets - 10 reps - 5 hold Seated Cervical Sidebending AROM - 3 x daily - 7 x weekly - 1 sets - 10 reps - 5 hold Resisted Finger Extension and Thumb Abduction - 1 x daily - 7 x weekly - 3 sets - 10 reps Sidelying Thoracic and Shoulder Rotation - 1 x daily - 7 x weekly - 10 reps - 2 sets - 5 sec hold

## 2020-03-14 DIAGNOSIS — F5101 Primary insomnia: Secondary | ICD-10-CM | POA: Diagnosis not present

## 2020-03-14 DIAGNOSIS — M8949 Other hypertrophic osteoarthropathy, multiple sites: Secondary | ICD-10-CM | POA: Diagnosis not present

## 2020-03-14 DIAGNOSIS — M5412 Radiculopathy, cervical region: Secondary | ICD-10-CM | POA: Diagnosis not present

## 2020-03-14 DIAGNOSIS — K219 Gastro-esophageal reflux disease without esophagitis: Secondary | ICD-10-CM | POA: Diagnosis not present

## 2020-03-14 DIAGNOSIS — E039 Hypothyroidism, unspecified: Secondary | ICD-10-CM | POA: Diagnosis not present

## 2020-03-14 DIAGNOSIS — Z7901 Long term (current) use of anticoagulants: Secondary | ICD-10-CM | POA: Diagnosis not present

## 2020-03-14 DIAGNOSIS — M659 Synovitis and tenosynovitis, unspecified: Secondary | ICD-10-CM | POA: Diagnosis not present

## 2020-03-14 DIAGNOSIS — E559 Vitamin D deficiency, unspecified: Secondary | ICD-10-CM | POA: Diagnosis not present

## 2020-03-14 DIAGNOSIS — M25551 Pain in right hip: Secondary | ICD-10-CM | POA: Diagnosis not present

## 2020-03-15 ENCOUNTER — Encounter: Payer: Self-pay | Admitting: Physical Therapy

## 2020-03-15 ENCOUNTER — Other Ambulatory Visit: Payer: Self-pay

## 2020-03-15 ENCOUNTER — Ambulatory Visit: Payer: Medicare PPO | Admitting: Physical Therapy

## 2020-03-15 DIAGNOSIS — M542 Cervicalgia: Secondary | ICD-10-CM

## 2020-03-15 DIAGNOSIS — M6281 Muscle weakness (generalized): Secondary | ICD-10-CM | POA: Diagnosis not present

## 2020-03-15 LAB — POCT INR: INR: 3.1 — AB (ref 2.0–3.0)

## 2020-03-15 NOTE — Patient Instructions (Signed)
Access Code: [email or web address] URL: [email or web address] Date: 03/15/2020 Prepared by: Jari Favre  Exercises Thoracic Extension Mobilization with Noodle - 1 x daily - 7 x weekly - 1 sets - 10 reps - 5 hold Open Book Chest Stretch on Towel Roll - 1 x daily - 7 x weekly - 1 sets - 10 reps - 5 hold Supine Chin Tuck - 3 x daily - 7 x weekly - 1 sets - 10 reps - 5 hold Seated Cervical Rotation AROM - 3 x daily - 7 x weekly - 1 sets - 10 reps - 5 hold Seated Cervical Sidebending AROM - 3 x daily - 7 x weekly - 1 sets - 10 reps - 5 hold Resisted Finger Extension and Thumb Abduction - 1 x daily - 7 x weekly - 3 sets - 10 reps Sidelying Thoracic and Shoulder Rotation - 1 x daily - 7 x weekly - 10 reps - 2 sets - 5 sec hold Shoulder extension with resistance - Neutral - 1 x daily - 7 x weekly - 3 sets - 10 reps Supine Shoulder Horizontal Abduction with Resistance - 1 x daily - 7 x weekly - 3 sets - 10 reps Supine Shoulder External Rotation on Foam Roll with Theraband - 1 x daily - 7 x weekly - 3 sets - 10 reps

## 2020-03-15 NOTE — Therapy (Signed)
[facility] [address] [place], [place], [address] Phone: [phone]   Fax:  [phone]  Physical Therapy Treatment  Patient Details  Name: Paul Ryan MRN: [id number] Date of Birth: April 16, 1945 Referring Provider (PT): Luanna Salk   Encounter Date: 03/15/2020   PT End of Session - 03/15/20 1151    Visit Number 4    Date for PT Re-Evaluation 05/02/20    Authorization Type [employer]    Authorization - Visit Number 4    Authorization - Number of Visits 12    Progress Note Due on Visit 10    PT Start Time 1147    PT Stop Time 1230    PT Time Calculation (min) 43 min    Activity Tolerance Patient tolerated treatment well    Behavior During Therapy [facility] for tasks assessed/performed           Past Medical History:  Diagnosis Date  . Acid reflux 02/03/2016  . Allergy    seasonal  . Anxiety 12/01/2016  . Chronic anticoagulation 06/14/2013  . Coagulopathy ([place]) 05/16/2011  . Collagen vascular disease ([place])   . Cough 12/01/2016  . DVT (deep venous thrombosis) ([name])    takes coumadin  . Dysuria 02/27/2013  . Erectile dysfunction 12/28/2016  . Hereditary protein S deficiency ([place]) 05/16/2011  . Hyperglycemia 12/26/2014  . Hyperlipidemia   . Hypothyroid 06/10/2011  . Insomnia 04/24/2016  . Left hip pain 12/17/2010  . MCI (mild cognitive impairment) 07/22/2016  . Nasal lesion 07/22/2016  . Polymyalgia ([place]) 03/21/2014  . 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 Preventative health care 12/17/2010  . Shingles 1982   right abdominal wall  . Staph skin infection 07/22/2016  . Thyroid nodule 11/29/2014  . Urinary hesitancy 09/30/2016  . Vitamin D deficiency 10/14/2011    Past Surgical History:  Procedure Laterality Date  . 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   Subjective Assessment - 03/15/20 1150    Subjective Pt states it has  been feeling better since last session and no pain currently.    Pertinent History PMR    Limitations Lifting;House hold activities    Patient Stated Goals be able to keep sculpting, learn exercises for mobility and strength, and reduce pain    Currently in Pain? No/denies                             [facility] Adult PT Treatment/Exercise - 03/15/20 0001      Neuro Re-ed    Neuro Re-ed Details  posture and cues for decreased lumbar lordosis and unlocked knees in standing and to lock ribcaage down      Neck Exercises: Seated   Postural Training posture while doing UBE backwards 4 min L1.5 - PT present for status update    Other Seated Exercise side lying thoracic rotation      Neck Exercises: Supine   Other Supine Exercise lying on foam roller - red band - horizontal abd, ER, diagonals - 20x      Shoulder Exercises: Standing   Extension Strengthening;20 reps;Weights    Extension Weight (lbs) 20      Manual Therapy   Soft tissue mobilization upper traps, cervical and thoracic paraspinals, scalenes, suboccipitals; manual traction  PT Short Term Goals - 03/15/20 1437      PT SHORT TERM GOAL #2   Title Pt will improve bil neck rotation to 45 deg    Status On-going      PT SHORT TERM GOAL #3   Title Pt will report reduced neck pain by at least 20%    Baseline better today    Status On-going             PT Long Term Goals - 03/07/20 1251      PT LONG TERM GOAL #1   Title Pt will be ind with advanced HEP and understand how to safely progress    Time 8    Period Weeks    Status New    Target Date 05/02/20      PT LONG TERM GOAL #2   Title Pt will improve bil neck rotation to at least 55 deg and flexion/ext to at least 55 each for improved functional visibility with driving and sculpting projects    Time 8    Period Weeks    Status New    Target Date 05/02/20      PT LONG TERM GOAL #3   Title Pt will improve Lt grip strength  to at least 75lb and Lt pincer grip to at least 22 to reach symmetrical strength with Rt side    Baseline Lt grip 75lb, Lt pincer 16lb    Time 8    Period Weeks    Status New    Target Date 05/02/20      PT LONG TERM GOAL #4   Title Pt will report at least 70% reduction in pain thorughout day    Time 8    Period Weeks    Status New    Target Date 05/02/20      PT LONG TERM GOAL #5   Title Pt will achieve at least 4+/5 strength in bil biceps adn shoulder abductors 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    Time 8    Period Weeks    Status New    Target Date 05/02/20                 Plan - 03/15/20 1438    Clinical Impression Statement Pt did well with exercises added today and was updated on HEP.  Pt needs cues to keep from using thoracic extensors and keep spine staight throughout.  Pt will benefit from skilled PT to continue working on core and posture strength so his neck and shoulders are in best position for functional use.    PT Treatment/Interventions ADLs/Self Care Home Management;Cryotherapy;Electrical Stimulation;Moist Heat;Traction;Neuromuscular re-education;Therapeutic exercise;Therapeutic activities;Functional mobility training;Patient/family education;Manual techniques;Dry needling;Passive range of motion;Taping;Spinal Manipulations;Joint Manipulations    PT Next Visit Plan supine on foam roller core strength, scap strength, W/T/Y on ball, DN and STMif needed    PT Home Exercise Plan Access Code: [email or web address]    Consulted and Agree with Plan of Care Patient           Patient will benefit from skilled therapeutic intervention in order to improve the following deficits and impairments:  Decreased range of motion,Impaired UE functional use,Increased muscle spasms,Postural dysfunction,Decreased strength,Decreased mobility,Impaired flexibility,Pain,Decreased activity tolerance,Decreased endurance  Visit Diagnosis: Cervicalgia  Muscle weakness  (generalized)     Problem List Patient Active Problem List   Diagnosis Date Noted  . Olecranon bursitis of right elbow 05/19/2019  . Yeast infection of the skin 11/05/2017  . Flank  pain 11/05/2017  . Urethritis 06/22/2017  . Memory loss 06/22/2017  . Foot pain, right 06/16/2017  . Erectile dysfunction 12/28/2016  . Cough 12/01/2016  . Anxiety 12/01/2016  . Urinary hesitancy 09/30/2016  . MCI (mild cognitive impairment) 07/22/2016  . Staph skin infection 07/22/2016  . Insomnia 04/24/2016  . Urinary frequency 04/24/2016  . Acid reflux 02/03/2016  . Sinusitis 01/29/2016  . 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 Dyspnea 12/29/2014  . Hyperglycemia 12/26/2014  . Pulmonary embolism ([place]) 08/07/2014  . Polymyalgia rheumatica ([place]) 04/25/2014  . Chronic anticoagulation 06/14/2013  . Leukocytosis 05/29/2013  . Greater trochanteric bursitis of right hip 05/16/2013  . Hip pain, right 05/01/2013  . Vitamin D deficiency 10/14/2011  . Hypothyroid 06/10/2011  . Coagulopathy ([place]) 05/16/2011  . Hereditary protein S deficiency ([place]) 05/16/2011  . Left hip pain 12/17/2010  . Preventative health care 12/17/2010  . DVT (deep venous thrombosis) ([place])   . Allergic state   . Hyperlipidemia     Jule Ser, PT 03/15/2020, 3:06 PM  [facility] [address], [place], [place], [address] Phone: [phone]   Fax:  [phone]  Name: Helix Lafontaine MRN: [id number] Date of Birth: August 28, 1945

## 2020-03-19 ENCOUNTER — Encounter: Payer: Medicare PPO | Admitting: Physical Therapy

## 2020-03-21 ENCOUNTER — Encounter: Payer: Medicare PPO | Admitting: Physical Therapy

## 2020-03-22 ENCOUNTER — Encounter: Payer: Medicare PPO | Admitting: Physical Therapy

## 2020-03-26 ENCOUNTER — Encounter: Payer: Self-pay | Admitting: Physical Therapy

## 2020-03-26 ENCOUNTER — Ambulatory Visit: Payer: Medicare PPO | Admitting: Physical Therapy

## 2020-03-26 ENCOUNTER — Other Ambulatory Visit: Payer: Self-pay

## 2020-03-26 DIAGNOSIS — M542 Cervicalgia: Secondary | ICD-10-CM | POA: Diagnosis not present

## 2020-03-26 DIAGNOSIS — M6281 Muscle weakness (generalized): Secondary | ICD-10-CM

## 2020-03-26 NOTE — Therapy (Signed)
[facility] [address], [place], [place], [address] Phone: [phone]   Fax:  [phone]  Physical Therapy Treatment  Patient Details  Name: Paul Ryan MRN: [id number] Date of Birth: February 13, 1946 Referring Provider (PT): Luanna Salk   Encounter Date: 03/26/2020   PT End of Session - 03/26/20 1239    Visit Number 5    Date for PT Re-Evaluation 05/02/20    Authorization Type [employer]    Authorization - Visit Number 5    Authorization - Number of Visits 12    Progress Note Due on Visit 10    PT Start Time 1150    PT Stop Time 1232    PT Time Calculation (min) 42 min    Activity Tolerance Patient tolerated treatment well    Behavior During Therapy [facility] for tasks assessed/performed           Past Medical History:  Diagnosis Date  . Acid reflux 02/03/2016  . Allergy    seasonal  . Anxiety 12/01/2016  . Chronic anticoagulation 06/14/2013  . Coagulopathy ([place]) 05/16/2011  . Collagen vascular disease ([place])   . Cough 12/01/2016  . DVT (deep venous thrombosis) ([name])    takes coumadin  . Dysuria 02/27/2013  . Erectile dysfunction 12/28/2016  . 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 Hyperglycemia 12/26/2014  . Hyperlipidemia   . Hypothyroid 06/10/2011  . Insomnia 04/24/2016  . Left hip pain 12/17/2010  . MCI (mild cognitive impairment) 07/22/2016  . Nasal lesion 07/22/2016  . Polymyalgia ([place]) 03/21/2014  . 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 Preventative health care 12/17/2010  . Shingles 1982   right abdominal wall  . Staph skin infection 07/22/2016  . Thyroid nodule 11/29/2014  . Urinary hesitancy 09/30/2016  . Vitamin D deficiency 10/14/2011    Past Surgical History:  Procedure Laterality Date  . 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   Subjective Assessment - 03/26/20 1154    Subjective I fell on ice after  last appointment but I'm ok.  Strength is improving in hands and arms.  Neck pain is dull 1/10.  I want to know if I can return to my weight lifting.    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 Yes    Pain Score 1     Pain Location Neck    Pain Orientation Right;Left    Pain Descriptors / Indicators Aching    Pain Type Chronic pain    Pain Onset More than a month ago    Pain Frequency Intermittent    Aggravating Factors  neck pain with weight lifting but wants to get back to it              [facility] PT Assessment - 03/26/20 0001      AROM   Cervical Flexion 55    Cervical Extension 45    Cervical - Right Side Bend 35    Cervical - Left Side Bend 30    Cervical - Right Rotation 60    Cervical - Left Rotation 60  [facility] Adult PT Treatment/Exercise - 03/26/20 0001      Exercises   Exercises Shoulder;Elbow      Elbow Exercises   Elbow Flexion Limitations 10lb bicep curl with supination in standing x 10 reps      Shoulder Exercises: Standing   Horizontal ABduction Strengthening;Both;Theraband;10 reps    Theraband Level (Shoulder Horizontal ABduction) Level 2 (Red)    Horizontal ABduction Limitations VC and TC scapular retraction    ABduction Strengthening;Both;10 reps;Weights    Shoulder ABduction Weight (lbs) 5    ABduction Limitations short lever arm elbow bent to 90    Other Standing Exercises overhead press bil 5lb and 10lb 1x5 each hands to "Y"    Other Standing Exercises review of Pt's tai chi warm up exercises with thoracic and cervial rotation movements, good form and posture with this      Shoulder Exercises: ROM/Strengthening   UBE (Upper Arm Bike) L2 x 5'                    PT Short Term Goals - 03/26/20 1240      PT SHORT TERM  GOAL #1   Title Pt will be ind with initial HEP    Status Achieved      PT SHORT TERM GOAL #2   Title Pt will improve bil neck rotation to 45 deg    Baseline 60 deg bil    Status Achieved      PT SHORT TERM GOAL #3   Title Pt will report reduced neck pain by at least 20%    Status Achieved             PT Long Term Goals - 03/26/20 1240      PT LONG TERM GOAL #1   Title Pt will be ind with advanced HEP and understand how to safely progress    Baseline beginning return to weight training    Status On-going      PT LONG TERM GOAL #2   Title Pt will improve bil neck rotation to at least 55 deg and flexion/ext to at least 55 each for improved 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    Status On-going                 Plan - 03/26/20 1241    Clinical Impression Statement Pt arrives with 1/10 pain.  He expressed interest in returning to his old weight training and tai chi movement based exercises.  Session spent today going over UE weights and tband resistance options with PT providing form and cues for proper performance.  PT encouraged Pt to reduce weights as he was previously having pain with weight training  and appeared to be working beyond his ability level and likely sacrificing form.  HEP updated for biceps, shoulder abd, overhead press and horiz abd all in standing with postural  focus.  Pt has gained signif amount of ROM since starting PT, achieving end range rotation to 60 deg bil and flexion to 55 deg.  He has improving grip strength in Lt hand but pincer grip still lags compared to Rt hand.  Continue along POC for ROM, ther ex with good form, and postural re-ed.    Comorbidities PMR, sculptor needing use of bil UEs    Rehab Potential Good    PT Frequency 2x / week    PT Duration 8 weeks    PT Treatment/Interventions ADLs/Self Care Home Management;Cryotherapy;Electrical Stimulation;Moist Heat;Traction;Neuromuscular re-education;Therapeutic exercise;Therapeutic activities;Functional mobility training;Patient/family education;Manual techniques;Dry needling;Passive range of motion;Taping;Spinal Manipulations;Joint Manipulations    PT Next Visit Plan UBE, f/u on new HEP updates, try W/T/Y on ball, lat pulldown, row with rotation, neck posture when focused on sculpting    PT Home Exercise Plan Access Code: [email or web address] info    Consulted and Agree with Plan of Care Patient           Patient will benefit from skilled therapeutic intervention in order to improve the following deficits and impairments:     Visit Diagnosis: Cervicalgia  Muscle weakness (generalized)     Problem List Patient Active Problem List   Diagnosis Date Noted  . Olecranon bursitis of right elbow 05/19/2019  . Yeast infection of the skin 11/05/2017  . Flank pain 11/05/2017  . Urethritis 06/22/2017  . Memory loss 06/22/2017  . Foot pain, right 06/16/2017  . Erectile dysfunction 12/28/2016  . Cough 12/01/2016  . Anxiety 12/01/2016  . Urinary hesitancy 09/30/2016  . MCI (mild cognitive impairment) 07/22/2016  . Staph skin infection 07/22/2016  . Insomnia 04/24/2016  . Urinary frequency 04/24/2016  . Acid reflux 02/03/2016  . Sinusitis 01/29/2016  . 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 Dyspnea 12/29/2014  . Hyperglycemia  12/26/2014  . Pulmonary embolism ([place]) 08/07/2014  . Polymyalgia rheumatica ([place]) 04/25/2014  . Chronic anticoagulation 06/14/2013  . Leukocytosis 05/29/2013  . Greater trochanteric bursitis of right hip 05/16/2013  . Hip pain, right 05/01/2013  . Vitamin D deficiency 10/14/2011  . Hypothyroid 06/10/2011  . Coagulopathy ([place]) 05/16/2011  . 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 Left hip pain 12/17/2010  . Preventative health care 12/17/2010  . DVT (deep venous thrombosis) ([place])   . Allergic state   . Hyperlipidemia     Baruch Merl, PT 03/26/20 12:46 PM   [facility] [address], [place], [place], [address] Phone: [phone]   Fax:  [phone]  Name: Paul Ryan MRN: [id number] Date of Birth: [birth year]

## 2020-03-28 ENCOUNTER — Ambulatory Visit: Payer: Medicare PPO | Admitting: Physical Therapy

## 2020-03-28 ENCOUNTER — Other Ambulatory Visit: Payer: Self-pay

## 2020-03-28 ENCOUNTER — Encounter: Payer: Self-pay | Admitting: Physical Therapy

## 2020-03-28 DIAGNOSIS — M6281 Muscle weakness (generalized): Secondary | ICD-10-CM | POA: Diagnosis not present

## 2020-03-28 DIAGNOSIS — M542 Cervicalgia: Secondary | ICD-10-CM

## 2020-03-28 NOTE — Therapy (Signed)
[facility] [address] [place], [place], [address] Phone: [phone]   Fax:  [phone]  Physical Therapy Treatment  Patient Details  Name: Paul Ryan MRN: [id number] Date of Birth: 1945/11/30 Referring Provider (PT): Luanna Salk   Encounter Date: 03/28/2020   PT End of Session - 03/28/20 1238    Visit Number 6    Date for PT Re-Evaluation 05/02/20    Authorization Type [employer]    Authorization - Visit Number 6    Authorization - Number of Visits 12    Progress Note Due on Visit 10    PT Start Time 6761    PT Stop Time 1232    PT Time Calculation (min) 44 min    Activity Tolerance Patient tolerated treatment well    Behavior During Therapy [facility] for tasks assessed/performed           Past Medical History:  Diagnosis Date  . Acid reflux 02/03/2016  . Allergy    seasonal  . Anxiety 12/01/2016  . Chronic anticoagulation 06/14/2013  . Coagulopathy ([place]) 05/16/2011  . Collagen vascular disease ([place])   . Cough 12/01/2016  . DVT (deep venous thrombosis) ([name])    takes coumadin  . Dysuria 02/27/2013  . Erectile dysfunction 12/28/2016  . 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 Hyperglycemia 12/26/2014  . Hyperlipidemia   . Hypothyroid 06/10/2011  . Insomnia 04/24/2016  . Left hip pain 12/17/2010  . MCI (mild cognitive impairment) 07/22/2016  . Nasal lesion 07/22/2016  . Polymyalgia ([place]) 03/21/2014  . 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 Preventative health care 12/17/2010  . Shingles 1982   right abdominal wall  . Staph skin infection 07/22/2016  . Thyroid nodule 11/29/2014  . Urinary hesitancy 09/30/2016  . Vitamin D deficiency 10/14/2011    Past Surgical History:  Procedure Laterality Date  . 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   Subjective Assessment - 03/28/20 1151    Subjective I had some pain  after last visit along Lt side of neck and Rt mid-back so I didn't add the weight lifting after all.  I am getting better at my postural cues.    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 Yes    Pain Score 2     Pain Location Neck    Pain Orientation Left    Pain Descriptors / Indicators Aching;Tightness    Pain Type Chronic pain    Pain Onset More than a month ago    Pain Frequency Intermittent                             [facility] Adult PT Treatment/Exercise - 03/28/20 0001      Self-Care   Self-Care Other Self-Care Comments    Other Self-Care Comments  DN aftercare: drink water and use hot shower or heat for soreness      Exercises   Exercises Shoulder;Neck;Lumbar      Neck Exercises: Machines for Strengthening   UBE (Upper Arm Bike) backwards only 4' L 2.5, PT present to discuss symptoms      Neck Exercises: Seated  Cervical Rotation Both;5 reps    Lateral Flexion Both;5 reps    Other Seated Exercise neck flexion/extension x 5 reps      Manual Therapy   Manual Therapy Joint mobilization;Soft tissue mobilization    Joint Mobilization O/A anterior glide for ext bil, Rt, Lt Gr 3/4, C1-C3 mobs for ext Gr 3/4, Lt U-joint sideglides, mob with movement rotation/flexion combo    Soft tissue mobilization bil SO, scalenes, upper trap, cervical paraspinals, Rt thoracic paraspinal stripping and broadening            Trigger Point Dry Needling - 03/28/20 0001    Consent Given? Yes    Education Handout Provided Previously provided    Muscles Treated Head and Neck Suboccipitals;Upper trapezius    Dry Needling Comments Lt upper trap, anterior approach, bil SO    Upper Trapezius Response Twitch reponse elicited;Palpable increased muscle length    Suboccipitals Response Twitch response  elicited;Palpable increased muscle length                  PT Short Term Goals - 03/26/20 1240      PT SHORT TERM GOAL #1   Title Pt will be ind with initial HEP    Status Achieved      PT SHORT TERM GOAL #2   Title Pt will improve bil neck rotation to 45 deg    Baseline 60 deg bil    Status Achieved      PT SHORT TERM GOAL #3   Title Pt will report reduced neck pain by at least 20%    Status Achieved             PT Long Term Goals - 03/26/20 1240      PT LONG TERM GOAL #1   Title Pt will be ind with advanced HEP and understand how to safely progress    Baseline beginning return to weight training    Status On-going      PT LONG TERM GOAL #2   Title Pt will improve bil neck rotation to at least 55 deg and flexion/ext to at least 55 each for improved 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    Status On-going                 Plan - 03/28/20 1239    Clinical Impression Statement Pt had increased pain (Lt neck and Rt mid-back) following attempts to return to UE weight lifting last visit.  PT and Pt discussed value of learning that it is too soon for this, even with reduced weights from what he used to use.  He presented with signif tension and TPs in bil SO, Lt upper trap, and  Rt thoracic paraspinals today.  He also has limited upper cervical joint mobility bil and L  U-joint restrictions.  Manual techniques including joint mobs, dry needling and STM were used for majority of session with excellent release and elongation of soft tissues and improved ROM secondary to improved upper cervical joint mobility.  Pt had painfree neck extension and much less strain to rotate head bil end of session.  Pt was able to tolerate high grade mobs and deep STM.  Continue along POC with focus on manual therapy to reduce tension and improve mobility of c-spine and t-spine.    Comorbidities PMR, sculptor needing use of bil UEs    Rehab Potential Good    PT Frequency 2x / week    PT Duration 8 weeks    PT Treatment/Interventions ADLs/Self Care Home Management;Cryotherapy;Electrical Stimulation;Moist Heat;Traction;Neuromuscular re-education;Therapeutic exercise;Therapeutic activities;Functional mobility training;Patient/family education;Manual techniques;Dry needling;Passive range of motion;Taping;Spinal Manipulations;Joint Manipulations    PT Next Visit Plan f/u on DN to bil SO and Lt upper trap (anterior approach), continue Rt thoracic stripping if still tight, mobs to c-spine, try dead bug and thoracic stretching/ROM    PT Home Exercise Plan Access Code: [email or web address] [email or web address]    Consulted and Agree with Plan of Care Patient           Patient will benefit from skilled therapeutic intervention in order to improve the following deficits and impairments:     Visit Diagnosis: Cervicalgia  Muscle weakness (generalized)     Problem List Patient Active Problem List   Diagnosis Date Noted  . Olecranon bursitis of right elbow 05/19/2019  . Yeast infection of the skin 11/05/2017  . Flank pain 11/05/2017  . Urethritis 06/22/2017  . Memory loss 06/22/2017  . Foot pain, right 06/16/2017  . Erectile dysfunction 12/28/2016  . Cough 12/01/2016  . Anxiety 12/01/2016  . Urinary hesitancy 09/30/2016  . MCI (mild cognitive impairment) 07/22/2016  . Staph skin infection 07/22/2016  .  Insomnia 04/24/2016  . Urinary frequency 04/24/2016  . Acid reflux 02/03/2016  . Sinusitis 01/29/2016  . 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 Dyspnea 12/29/2014  . Hyperglycemia 12/26/2014  . Pulmonary embolism ([place]) 08/07/2014  . Polymyalgia rheumatica ([place]) 04/25/2014  . Chronic anticoagulation 06/14/2013  . Leukocytosis 05/29/2013  . Greater trochanteric bursitis of right hip 05/16/2013  . Hip pain, right 05/01/2013  . Vitamin D deficiency 10/14/2011  . Hypothyroid 06/10/2011  . Coagulopathy ([place]) 05/16/2011  . 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 Left hip pain 12/17/2010  . Preventative health care 12/17/2010  . DVT (deep venous thrombosis) ([place])   . Allergic state   . Hyperlipidemia     Baruch Merl, PT 03/28/20 12:51 PM   [facility] [address] [place], [place], [address] Phone: [phone]   Fax:  [phone]  Name: Paul Ryan MRN: [id number] Date of Birth: 08-Nov-1945

## 2020-04-02 ENCOUNTER — Encounter: Payer: Self-pay | Admitting: Physical Therapy

## 2020-04-02 ENCOUNTER — Ambulatory Visit: Payer: Medicare PPO | Admitting: Physical Therapy

## 2020-04-02 ENCOUNTER — Other Ambulatory Visit: Payer: Self-pay

## 2020-04-02 DIAGNOSIS — M6281 Muscle weakness (generalized): Secondary | ICD-10-CM

## 2020-04-02 DIAGNOSIS — M542 Cervicalgia: Secondary | ICD-10-CM

## 2020-04-02 NOTE — Therapy (Signed)
[facility] [address] [place], [place], [address] Phone: [phone]   Fax:  [phone]  Physical Therapy Treatment  Patient Details  Name: Paul Ryan MRN: [id number] Date of Birth: 10-22-1945 Referring Provider (PT): Luanna Salk   Encounter Date: 04/02/2020   PT End of Session - 04/02/20 1150    Visit Number 7    Date for PT Re-Evaluation 05/02/20    Authorization Type [employer]    Authorization - Visit Number 7    Authorization - Number of Visits 12    PT Start Time 8527    PT Stop Time 1230    PT Time Calculation (min) 42 min    Activity Tolerance Patient tolerated treatment well    Behavior During Therapy [facility] for tasks assessed/performed           Past Medical History:  Diagnosis Date  . Acid reflux 02/03/2016  . Allergy    seasonal  . Anxiety 12/01/2016  . Chronic anticoagulation 06/14/2013  . Coagulopathy ([place]) 05/16/2011  . Collagen vascular disease ([place])   . Cough 12/01/2016  . DVT (deep venous thrombosis) ([name])    takes coumadin  . Dysuria 02/27/2013  . Erectile dysfunction 12/28/2016  . 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 Hyperglycemia 12/26/2014  . Hyperlipidemia   . Hypothyroid 06/10/2011  . Insomnia 04/24/2016  . Left hip pain 12/17/2010  . MCI (mild cognitive impairment) 07/22/2016  . Nasal lesion 07/22/2016  . Polymyalgia ([place]) 03/21/2014  . 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 Preventative health care 12/17/2010  . Shingles 1982   right abdominal wall  . Staph skin infection 07/22/2016  . Thyroid nodule 11/29/2014  . Urinary hesitancy 09/30/2016  . Vitamin D deficiency 10/14/2011    Past Surgical History:  Procedure Laterality Date  . 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   Subjective Assessment - 04/02/20 1152    Subjective My neck is much improved but the trigger point in my  Rt shoulder blade area is terrible.  A 7/10.  I can't use the tennis ball on it b/c it's too tender.    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 Yes    Pain Score 7     Pain Location Back    Pain Orientation Right;Posterior;Upper    Pain Descriptors / Indicators Tightness;Cramping;Spasm    Pain Type Chronic pain    Pain Onset More than a month ago    Pain Frequency Constant                             [facility] Adult PT Treatment/Exercise - 04/02/20 0001      Exercises   Exercises Shoulder;Neck;Lumbar      Neck Exercises: Machines for Strengthening   UBE (Upper Arm Bike) L2.5 alt each min fwd/bwd x 4' PT present to discuss symptoms      Neck Exercises: Supine   Other Supine Exercise thoracic ext over foam roller x 10 reps, thoracic self-mobs over foam roller rock side to side at T5      Lumbar Exercises: Seated   Other Seated Lumbar Exercises  thoracic extension over chair x 5 reps at upper t-spine and 5 at mid-tspine    Other Seated Lumbar Exercises thoracic rotation Rt/Lt with hand behind head x 5 each      Shoulder Exercises: Stretch   Other Shoulder Stretches rhomboid stretch in doorway, Rt, mad cat with shoulder protraction 2x10 sec stretch for rhomboids/mid trap    Other Shoulder Stretches doorway stretch x 30 sec bil      Manual Therapy   Manual Therapy Joint mobilization;Soft tissue mobilization    Joint Mobilization Gr V costovertebral joint T4/5 on Rt, PAs t-spine Gr II/III T3-T8, costotransverse joint PA Rt T4-T6 Gr II/III    Soft tissue mobilization Rt rhomboids, middle trap, paraspinals                    PT Short Term Goals - 03/26/20 1240      PT SHORT TERM GOAL #1   Title Pt will be ind with initial HEP    Status Achieved      PT SHORT TERM GOAL  #2   Title Pt will improve bil neck rotation to 45 deg    Baseline 60 deg bil    Status Achieved      PT SHORT TERM GOAL #3   Title Pt will report reduced neck pain by at least 20%    Status Achieved             PT Long Term Goals - 03/26/20 1240      PT LONG TERM GOAL #1   Title Pt will be ind with advanced HEP and understand how to safely progress    Baseline beginning return to weight training    Status On-going      PT LONG TERM GOAL #2   Title Pt will improve bil neck rotation to at least 55 deg and flexion/ext to at least 55 each for improved 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    Status On-going                 Plan - 04/02/20 1248    Clinical Impression Statement Pt with maintained improvement in painfree cervical ROM gains from last visit although with increased Rt intrascapular pain, worse with use of Rt UE.  Pt has limited joint mobility at T4-T6 costovertebral and costotransverse joints on Rt with overlying TPs in rhomboid, middle trap and thoracic paraspinals.  PT used manual tecniques and introduced ROM, stretching, and foam roller self-mobs with reduction of pain to 4/10 and improved joint position and mobility in t-spine.  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    Comorbidities PMR, sculptor needing use of bil UEs    PT Frequency 2x / week    PT Duration 8 weeks    PT Treatment/Interventions ADLs/Self Care Home Management;Cryotherapy;Electrical Stimulation;Moist Heat;Traction;Neuromuscular re-education;Therapeutic exercise;Therapeutic activities;Functional mobility training;Patient/family education;Manual techniques;Dry needling;Passive range of motion;Taping;Spinal Manipulations;Joint Manipulations    PT Next Visit Plan f/u on Rt intrascap TPs, cervical and thoracic manual techniques/stretching/ROM, try quadruped cat/cow and t-spine rotation, dead bug    PT Home Exercise Plan Access Code: [email or web address] info    Consulted and Agree with Plan of Care Patient           Patient will benefit from skilled therapeutic intervention in order to improve the following deficits and impairments:     Visit Diagnosis: Cervicalgia  Muscle weakness (generalized)     Problem List Patient Active Problem List   Diagnosis Date Noted  . Olecranon bursitis of right elbow 05/19/2019  . Yeast infection of the skin 11/05/2017  . Flank pain 11/05/2017  . Urethritis 06/22/2017  . Memory loss 06/22/2017  . Foot pain, right 06/16/2017  . Erectile dysfunction 12/28/2016  . Cough 12/01/2016  . Anxiety 12/01/2016  . Urinary hesitancy 09/30/2016  . MCI (mild cognitive impairment) 07/22/2016  . Staph skin infection 07/22/2016  . Insomnia 04/24/2016  . Urinary frequency 04/24/2016  . Acid reflux 02/03/2016  . Sinusitis 01/29/2016  . 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 Dyspnea 12/29/2014  . Hyperglycemia 12/26/2014  . Pulmonary embolism ([place]) 08/07/2014  . Polymyalgia rheumatica ([place]) 04/25/2014  . Chronic anticoagulation 06/14/2013  . Leukocytosis 05/29/2013  . Greater trochanteric bursitis of right hip 05/16/2013  . Hip pain,  right 05/01/2013  . Vitamin D deficiency 10/14/2011  . Hypothyroid 06/10/2011  . Coagulopathy ([place]) 05/16/2011  . 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 Left hip pain 12/17/2010  . Preventative health care 12/17/2010  . DVT (deep venous thrombosis) ([place])   . Allergic state   . Hyperlipidemia     Venetia Night E Shan Valdes 04/02/2020, 12:54 PM  [facility] [address], [place], [place], [address] Phone: [phone]   Fax:  [phone]  Name: Paul Ryan MRN: [id number] Date of Birth: February 09, 1946

## 2020-04-04 DIAGNOSIS — Z7901 Long term (current) use of anticoagulants: Secondary | ICD-10-CM | POA: Diagnosis not present

## 2020-04-04 DIAGNOSIS — D6859 Other primary thrombophilia: Secondary | ICD-10-CM | POA: Diagnosis not present

## 2020-04-04 DIAGNOSIS — I2699 Other pulmonary embolism without acute cor pulmonale: Secondary | ICD-10-CM | POA: Diagnosis not present

## 2020-04-05 LAB — POCT INR: INR: 3 (ref 2.0–3.0)

## 2020-04-06 ENCOUNTER — Other Ambulatory Visit: Payer: Self-pay

## 2020-04-06 ENCOUNTER — Encounter: Payer: Self-pay | Admitting: Physical Therapy

## 2020-04-06 ENCOUNTER — Encounter: Payer: Medicare PPO | Admitting: Physical Therapy

## 2020-04-06 ENCOUNTER — Ambulatory Visit: Payer: Medicare PPO | Attending: Rheumatology | Admitting: Physical Therapy

## 2020-04-06 DIAGNOSIS — M542 Cervicalgia: Secondary | ICD-10-CM | POA: Diagnosis not present

## 2020-04-06 DIAGNOSIS — M6281 Muscle weakness (generalized): Secondary | ICD-10-CM | POA: Insufficient documentation

## 2020-04-06 NOTE — Therapy (Signed)
[facility] [address] [place], [place], [address] Phone: [phone]   Fax:  [phone]  Physical Therapy Treatment  Patient Details  Name: Paul Ryan MRN: [id number] Date of Birth: 1945-03-18 Referring Provider (PT): Luanna Salk   Encounter Date: 04/06/2020   PT End of Session - 04/06/20 1109    Visit Number 8    Date for PT Re-Evaluation 05/02/20    Authorization Type [employer]    Authorization - Visit Number 8    Authorization - Number of Visits 12    PT Start Time 6962    PT Stop Time 1146    PT Time Calculation (min) 41 min    Activity Tolerance Patient tolerated treatment well    Behavior During Therapy [facility] for tasks assessed/performed           Past Medical History:  Diagnosis Date  . Acid reflux 02/03/2016  . Allergy    seasonal  . Anxiety 12/01/2016  . Chronic anticoagulation 06/14/2013  . Coagulopathy ([place]) 05/16/2011  . Collagen vascular disease ([place])   . Cough 12/01/2016  . DVT (deep venous thrombosis) ([name])    takes coumadin  . Dysuria 02/27/2013  . Erectile dysfunction 12/28/2016  . 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 Hyperglycemia 12/26/2014  . Hyperlipidemia   . Hypothyroid 06/10/2011  . Insomnia 04/24/2016  . Left hip pain 12/17/2010  . MCI (mild cognitive impairment) 07/22/2016  . Nasal lesion 07/22/2016  . Polymyalgia ([place]) 03/21/2014  . 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 Preventative health care 12/17/2010  . Shingles 1982   right abdominal wall  . Staph skin infection 07/22/2016  . Thyroid nodule 11/29/2014  . Urinary hesitancy 09/30/2016  . Vitamin D deficiency 10/14/2011    Past Surgical History:  Procedure Laterality Date  . 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   Subjective Assessment - 04/06/20 1107    Subjective Ongoing Rt shoulder blade trigger point but less acute  since last visit.  Went from a 7/10 down to a 4/10.  I do feel like I'm getting stronger. I was able to work more since last visit whereas I couldn't before when the pain was 7/10.    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 Yes    Pain Score 4     Pain Location Back    Pain Orientation Right;Posterior;Upper    Pain Descriptors / Indicators Spasm;Cramping    Pain Type Chronic pain    Pain Onset More than a month ago    Pain Frequency Constant                             [facility] Adult PT Treatment/Exercise - 04/06/20 0001      Exercises   Exercises Shoulder;Neck;Lumbar      Lumbar Exercises: Standing   Other Standing Lumbar Exercises cable pulley rotation 35lb x 10 each way holding cable handle at chest, TCs to allow for pelvic rotation      Lumbar Exercises: Supine   Dead Bug Limitations supine hooklying 5lb dumbbell held in bil UEs from 90 deg to overhead with  ab cue x 10 reps, some with feet on red ball      Shoulder Exercises: Supine   Horizontal ABduction Strengthening;Both;10 reps;Theraband    Theraband Level (Shoulder Horizontal ABduction) Level 1 (Yellow)    Horizontal ABduction Limitations laying on vertical foam roller    Diagonals Strengthening;Right;Left;10 reps;Theraband    Theraband Level (Shoulder Diagonals) Level 1 (Yellow)    Diagonals Limitations draw the sword    Other Supine Exercises UE W stretch on vertical foam roller x 10 sec each      Shoulder Exercises: Seated   Other Seated Exercises thoracic rotation hand behind head x 5 each way    Other Seated Exercises seated trunk flexion and flexion with SB large blue ball rollouts UEs on ball 3x10" each      Shoulder Exercises: Sidelying   Other Sidelying Exercises open books x 10 each way with red tband  resistance      Shoulder Exercises: ROM/Strengthening   UBE (Upper Arm Bike) L2.5 alt each min fwd/bwd x 4' PT present to discuss symptoms                    PT Short Term Goals - 03/26/20 1240      PT SHORT TERM GOAL #1   Title Pt will be ind with initial HEP    Status Achieved      PT SHORT TERM GOAL #2   Title Pt will improve bil neck rotation to 45 deg    Baseline 60 deg bil    Status Achieved      PT SHORT TERM GOAL #3   Title Pt will report reduced neck pain by at least 20%    Status Achieved             PT Long Term Goals - 03/26/20 1240      PT LONG TERM GOAL #1   Title Pt will be ind with advanced HEP and understand how to safely progress    Baseline beginning return to weight training    Status On-going      PT LONG TERM GOAL #2   Title Pt will improve bil neck rotation to at least 55 deg and flexion/ext to at least 55 each for improved 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    Status On-going                 Plan - 04/06/20 1151    Clinical Impression Statement Pt continues to have Rt intrascapular pain but with decreased intensity, improving from 7/10 to 4/10 since last visit.  PT utilized ROM, stretching and gentle postural cueing throughout session secondary to stiffness and myofascial restrictions along trunk today (  vs manual therapy) which  resulted in Pt report of 0/10 pain end of session.  PT updated HEP to reflect some of new ther ex today.  Pt continues to have restrictions into bil trunk rotation and some difficulty with use of abdominals for stabilization especially when UE task is overlayed.  Pt will continue to benefit from continued skilled PT with ongoing assessment of response to interventions.    Comorbidities PMR, sculptor needing use of bil UEs    Rehab Potential Good    PT Frequency 2x / week    PT Duration 8 weeks    PT Treatment/Interventions ADLs/Self Care Home Management;Cryotherapy;Electrical Stimulation;Moist Heat;Traction;Neuromuscular re-education;Therapeutic exercise;Therapeutic activities;Functional mobility training;Patient/family education;Manual techniques;Dry needling;Passive range of motion;Taping;Spinal Manipulations;Joint Manipulations    PT Next Visit Plan continue trunk ROM, gentle postural re-ed including abdominal strength with UE overlay, try quadruped thread needle and t-spine rotation    PT Home Exercise Plan Access Code: [email or web address] +DN info    Consulted and Agree with Plan of Care Patient           Patient will benefit from skilled therapeutic intervention in order to improve the following deficits and impairments:     Visit Diagnosis: Cervicalgia  Muscle weakness (generalized)     Problem List Patient Active Problem List   Diagnosis Date Noted  . Olecranon bursitis of right elbow 05/19/2019  . Yeast infection of the skin 11/05/2017  . Flank pain 11/05/2017  . Urethritis 06/22/2017  . Memory loss 06/22/2017  . Foot pain, right 06/16/2017  . Erectile dysfunction 12/28/2016  . Cough 12/01/2016  . Anxiety 12/01/2016  . Urinary hesitancy 09/30/2016  . MCI (mild cognitive impairment) 07/22/2016  . Staph skin infection 07/22/2016  . Insomnia 04/24/2016  . Urinary frequency 04/24/2016  . Acid reflux 02/03/2016  . Sinusitis 01/29/2016  . Degenerative arthritis of finger, right  12/20/2015  . 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 Dyspnea 12/29/2014  . Hyperglycemia 12/26/2014  . Pulmonary embolism ([place]) 08/07/2014  . Polymyalgia rheumatica ([place]) 04/25/2014  . Chronic anticoagulation 06/14/2013  . Leukocytosis 05/29/2013  . Greater trochanteric bursitis of right hip 05/16/2013  . Hip pain, right 05/01/2013  . Vitamin D deficiency 10/14/2011  . Hypothyroid 06/10/2011  . Coagulopathy ([place]) 05/16/2011  . 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 Left hip pain 12/17/2010  . Preventative health care 12/17/2010  . DVT (deep venous thrombosis) ([place])   . Allergic state   . Hyperlipidemia     Baruch Merl, PT 04/06/20 11:55 AM   [facility] [address], [place], [place], [address] Phone: [phone]   Fax:  [phone]  Name: Ova Voth MRN: [id number] Date of Birth: [birth year]

## 2020-04-09 ENCOUNTER — Encounter: Payer: Self-pay | Admitting: Physical Therapy

## 2020-04-09 ENCOUNTER — Other Ambulatory Visit: Payer: Self-pay

## 2020-04-09 ENCOUNTER — Ambulatory Visit: Payer: Medicare PPO | Admitting: Physical Therapy

## 2020-04-09 DIAGNOSIS — M6281 Muscle weakness (generalized): Secondary | ICD-10-CM

## 2020-04-09 DIAGNOSIS — M542 Cervicalgia: Secondary | ICD-10-CM | POA: Diagnosis not present

## 2020-04-09 NOTE — Therapy (Signed)
[facility] [address] [place], [place], [address] Phone: [phone]   Fax:  [phone]  Physical Therapy Treatment  Patient Details  Name: Paul Ryan MRN: [id number] Date of Birth: 1945-10-06 Referring Provider (PT): Luanna Salk   Encounter Date: 04/09/2020   PT End of Session - 04/09/20 1224    Visit Number 9    Date for PT Re-Evaluation 05/02/20    Authorization Type [employer]    Authorization - Visit Number 9    Authorization - Number of Visits 12    Progress Note Due on Visit 10    PT Start Time 4166    PT Stop Time 1230    PT Time Calculation (min) 45 min    Activity Tolerance Patient tolerated treatment well    Behavior During Therapy [facility] for tasks assessed/performed           Past Medical History:  Diagnosis Date  . Acid reflux 02/03/2016  . Allergy    seasonal  . Anxiety 12/01/2016  . Chronic anticoagulation 06/14/2013  . Coagulopathy ([place]) 05/16/2011  . Collagen vascular disease ([place])   . Cough 12/01/2016  . DVT (deep venous thrombosis) ([name])    takes coumadin  . Dysuria 02/27/2013  . Erectile dysfunction 12/28/2016  . 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 Hyperglycemia 12/26/2014  . Hyperlipidemia   . Hypothyroid 06/10/2011  . Insomnia 04/24/2016  . Left hip pain 12/17/2010  . MCI (mild cognitive impairment) 07/22/2016  . Nasal lesion 07/22/2016  . Polymyalgia ([place]) 03/21/2014  . 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 Preventative health care 12/17/2010  . Shingles 1982   right abdominal wall  . Staph skin infection 07/22/2016  . Thyroid nodule 11/29/2014  . Urinary hesitancy 09/30/2016  . Vitamin D deficiency 10/14/2011    Past Surgical History:  Procedure Laterality Date  . 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   Subjective Assessment - 04/09/20 1151    Subjective My pain is getting  better - last session helped and I hope to do it again so I can try to remember better for my HEP.  Maybe a 1-2/10 for pain in Rt shoulder blade.    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 Yes    Pain Score 1     Pain Location Shoulder    Pain Orientation Right;Upper;Posterior    Pain Descriptors / Indicators Aching    Pain Type Chronic pain    Pain Onset More than a month ago    Pain Frequency Constant                             [facility] Adult PT Treatment/Exercise - 04/09/20 0001      Exercises   Exercises Shoulder;Neck;Lumbar      Neck Exercises: Machines for Strengthening   UBE (Upper Arm Bike) L2.5 x 4' alt fwd/bwd each min, PT present to discuss symptoms and HEP      Lumbar Exercises: Standing   Other Standing Lumbar Exercises cable pulley rotation 35lb x 10 each way holding cable handle at chest, TCs to allow for pelvic  rotation      Lumbar Exercises: Seated   Other Seated Lumbar Exercises seated trunk flexion and flexion with SB hands on large blue ball 3x5 sec each way      Lumbar Exercises: Supine   Dead Bug Limitations supine hooklying 5lb dumbbell held in bil UEs from 90 deg to overhead with ab cue x 10 reps      Lumbar Exercises: Sidelying   Other Sidelying Lumbar Exercises open books x 3 each side      Lumbar Exercises: Quadruped   [place] 5 reps    Other Quadruped Lumbar Exercises thread needle x 5 each way      Shoulder Exercises: Supine   Horizontal ABduction Strengthening;Both;Theraband;15 reps    Theraband Level (Shoulder Horizontal ABduction) Level 1 (Yellow)    Horizontal ABduction Limitations laying on vertical foam roller    Diagonals Strengthening;Right;Left;10 reps;Theraband    Theraband Level (Shoulder Diagonals) Level 1 (Yellow)     Diagonals Limitations draw the sword    Other Supine Exercises UE W stretch on vertical foam roller x 10 sec each      Shoulder Exercises: Standing   Other Standing Exercises holding dowel across t-spine standing trunk rotation x 20 reps      Shoulder Exercises: Stretch   Other Shoulder Stretches doorway 2x30sec each at 60 deg and 90 deg    Other Shoulder Stretches seated thoracic rotation x 5 reps then hold with overpressure 2x10 sec Rt/Lt                    PT Short Term Goals - 04/09/20 1224      PT SHORT TERM GOAL #1   Title Pt will be ind with initial HEP    Status Achieved      PT SHORT TERM GOAL #2   Title Pt will improve bil neck rotation to 45 deg    Status Achieved      PT SHORT TERM GOAL #3   Title Pt will report reduced neck pain by at least 20%    Status Achieved             PT Long Term Goals - 04/09/20 1224      PT LONG TERM GOAL #1   Title Pt will be ind with advanced HEP and understand how to safely progress    Status On-going      PT LONG TERM GOAL #2   Title Pt will improve bil neck rotation to at least 55 deg and flexion/ext to at least 55 each for improved functional visibility with driving and sculpting projects    Baseline 60 deg bil rotation, neck flexion 55 deg    Status Achieved      PT LONG TERM GOAL #3   Title Pt will improve Lt grip strength to at least 75lb and Lt pincer grip to at least 22 to reach symmetrical strength with Rt side    Baseline Lt grip 73lb, Rt grip 100lb, Lt pincer 16lb    Status On-going      PT LONG TERM GOAL #4   Title Pt will report at least 70% reduction in pain thorughout day    Baseline 70%    Status Achieved      PT LONG TERM GOAL #5   Title Pt will achieve at least 4+/5 strength in bil biceps adn shoulder abductors 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    Status On-going  Plan - 04/09/20 1233    Clinical Impression Statement Pt with ongoing improvement in Rt  intrascapular pain, rating it only as a 1-2/10 on arrival and resolution of pain with ther ex today.  He has met all STGs and some LTGs, making good progress toward remaining goals.  He reports 70% improvement in pain since starting PT.  He has made gains in cervical ROM and UE strength.  He benefits from trunk stretching and ROM especially targeting thoracic rotation.  Pectoral flexibility has improved to allow greater range and ease of UE overhead positions.  Assuming no set backs next week likely d/c at 12 visits.  10th visit PN next visit.    Comorbidities PMR, sculptor needing use of bil UEs    PT Frequency 2x / week    PT Duration 8 weeks    PT Treatment/Interventions ADLs/Self Care Home Management;Cryotherapy;Electrical Stimulation;Moist Heat;Traction;Neuromuscular re-education;Therapeutic exercise;Therapeutic activities;Functional mobility training;Patient/family education;Manual techniques;Dry needling;Passive range of motion;Taping;Spinal Manipulations;Joint Manipulations    PT Next Visit Plan 10th visit PN next visit, continue trunk ROM/stretching, recheck goals for ROM and UE strength including grip    PT Home Exercise Plan Access Code: [email or web address] info    Consulted and Agree with Plan of Care Patient           Patient will benefit from skilled therapeutic intervention in order to improve the following deficits and impairments:     Visit Diagnosis: Cervicalgia  Muscle weakness (generalized)     Problem List Patient Active Problem List   Diagnosis Date Noted  . Olecranon bursitis of right elbow 05/19/2019  . Yeast infection of the skin 11/05/2017  . Flank pain 11/05/2017  . Urethritis 06/22/2017  . Memory loss 06/22/2017  . Foot pain, right 06/16/2017  . Erectile dysfunction 12/28/2016  . Cough 12/01/2016  . Anxiety 12/01/2016  . Urinary hesitancy 09/30/2016  . MCI (mild cognitive impairment) 07/22/2016  . Staph skin infection 07/22/2016  . Insomnia 04/24/2016  .  Urinary frequency 04/24/2016  . Acid reflux 02/03/2016  . Sinusitis 01/29/2016  . 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 Dyspnea 12/29/2014  . Hyperglycemia 12/26/2014  . Pulmonary embolism ([place]) 08/07/2014  . Polymyalgia rheumatica ([place]) 04/25/2014  . Chronic anticoagulation 06/14/2013  . Leukocytosis 05/29/2013  . Greater trochanteric bursitis of right hip 05/16/2013  . Hip pain, right 05/01/2013  . Vitamin D deficiency 10/14/2011  . Hypothyroid 06/10/2011  . Coagulopathy ([place]) 05/16/2011  . 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 Left hip pain 12/17/2010  . Preventative health care 12/17/2010  . DVT (deep venous thrombosis) ([place])   . Allergic state   . Hyperlipidemia     Alene Mires Lucille Crichlow 04/09/2020, 12:39 PM  [facility] [address] [place], [place], [address] Phone: [phone]   Fax:  [phone]  Name: Paul Ryan MRN: [id number] Date of Birth: 07/20/1945

## 2020-04-11 ENCOUNTER — Ambulatory Visit: Payer: Medicare PPO | Admitting: Physical Therapy

## 2020-04-11 ENCOUNTER — Other Ambulatory Visit: Payer: Self-pay

## 2020-04-11 ENCOUNTER — Encounter: Payer: Self-pay | Admitting: Physical Therapy

## 2020-04-11 DIAGNOSIS — M542 Cervicalgia: Secondary | ICD-10-CM

## 2020-04-11 DIAGNOSIS — M6281 Muscle weakness (generalized): Secondary | ICD-10-CM | POA: Diagnosis not present

## 2020-04-11 NOTE — Therapy (Signed)
[facility] [address] [place], [place], [address] Phone: [phone]   Fax:  [phone]  Physical Therapy Treatment  Patient Details  Name: Paul Ryan MRN: [id number] Date of Birth: 05/17/1945 Referring Provider (PT): Luanna Salk  Progress Note Reporting Period 03/07/20 to 04/11/20  See note below for Objective Data and Assessment of Progress/Goals.       Encounter Date: 04/11/2020   PT End of Session - 04/11/20 1224    Visit Number 10    Date for PT Re-Evaluation 05/02/20    Authorization Type [employer]    Authorization - Visit Number 10    Authorization - Number of Visits 12    Progress Note Due on Visit 10    PT Start Time 0071    PT Stop Time 1223    PT Time Calculation (min) 38 min    Activity Tolerance Patient tolerated treatment well    Behavior During Therapy WFL for tasks assessed/performed           Past Medical History:  Diagnosis Date  . Acid reflux 02/03/2016  . Allergy    seasonal  . Anxiety 12/01/2016  . Chronic anticoagulation 06/14/2013  . Coagulopathy ([place]) 05/16/2011  . Collagen vascular disease ([place])   . Cough 12/01/2016  . DVT (deep venous thrombosis) ([name])    takes coumadin  . Dysuria 02/27/2013  . Erectile dysfunction 12/28/2016  . 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 Hyperglycemia 12/26/2014  . Hyperlipidemia   . Hypothyroid 06/10/2011  . Insomnia 04/24/2016  . Left hip pain 12/17/2010  . MCI (mild cognitive impairment) 07/22/2016  . Nasal lesion 07/22/2016  . Polymyalgia ([place]) 03/21/2014  . 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 Preventative health care 12/17/2010  . Shingles 1982   right abdominal wall  . Staph skin infection 07/22/2016  . Thyroid nodule 11/29/2014  . Urinary hesitancy 09/30/2016  . Vitamin D deficiency 10/14/2011    Past Surgical History:  Procedure Laterality Date  . 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   Subjective Assessment - 04/11/20 1153    Subjective Rt shoulder blade pain is gone.  Just mild soreness along both sides of neck from moving things yesterday.    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 No/denies              [facility] PT Assessment - 04/11/20 0001      Assessment   Medical Diagnosis M35.3 (ICD-10-CM) - Polymyalgia rheumatica ([name]) M19.90 (ICD-10-CM) - Osteoarthritis, unspecified osteoarthritis type, unspecified site    Referring Provider (PT) Luanna Salk    Onset Date/Surgical Date --   1 year ago worsened   Hand Dominance Right    Next MD Visit as needed    Prior Therapy for his back      AROM   Cervical Flexion 55    Cervical Extension 45    Cervical - Right Side Bend 35    Cervical - Left Side Bend 30    Cervical - Right Rotation 60    Cervical - Left Rotation 60      Strength   Overall Strength Comments scapular strength  4+/5, UE strength 4/5 to 4+/5                         [facility] Adult PT Treatment/Exercise - 04/11/20 0001      Exercises   Exercises Neck;Shoulder;Lumbar      Neck Exercises: Machines for Strengthening   UBE (Upper Arm Bike) L2.5 x 4' alt fwd/bwd each min, PT present to discuss symptoms and HEP      Neck Exercises: Seated   Cervical Rotation Both;5 reps      Lumbar Exercises: Seated   Other Seated Lumbar Exercises seated trunk flexion and flexion with SB hands on large blue ball 3x5 sec each way    Other Seated Lumbar Exercises thoracic rotation 3x5" bil with overpressure      Lumbar Exercises: Supine   Dead Bug Limitations three variations: red ball release, table top and feet in hooklying x 5 each      Lumbar Exercises: Quadruped   Madcat/Old Horse 5 reps     Other Quadruped Lumbar Exercises thread needle x 3 each way      Shoulder Exercises: Standing   Row Strengthening;Both;Theraband;10 reps    Row Limitations black band    Other Standing Exercises bent over I, T, Y bil 2lb    Other Standing Exercises black tband over top of door chops 15 reps                    PT Short Term Goals - 04/09/20 1224      PT SHORT TERM GOAL #1   Title Pt will be ind with initial HEP    Status Achieved      PT SHORT TERM GOAL #2   Title Pt will improve bil neck rotation to 45 deg    Status Achieved      PT SHORT TERM GOAL #3   Title Pt will report reduced neck pain by at least 20%    Status Achieved             PT Long Term Goals - 04/09/20 1224      PT LONG TERM GOAL #1   Title Pt will be ind with advanced HEP and understand how to safely progress    Status On-going      PT LONG TERM GOAL #2   Title Pt will improve bil neck rotation to at least 55 deg and flexion/ext to at least 55 each for improved functional visibility with driving and sculpting projects    Baseline 60 deg bil rotation, neck flexion 55 deg    Status Achieved      PT LONG TERM GOAL #3   Title Pt will improve Lt grip strength to at least 75lb and Lt pincer grip to at least 22 to reach symmetrical strength with Rt side    Baseline Lt grip 73lb, Rt grip 100lb, Lt pincer 16lb    Status On-going      PT LONG TERM GOAL #4   Title Pt will report at least 70% reduction in pain thorughout day    Baseline 70%    Status Achieved      PT LONG TERM GOAL #5   Title Pt will achieve at least 4+/5 strength in bil biceps adn shoulder abductors 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    Status On-going                 Plan - 04/11/20 1225  Clinical Impression Statement Pt continues to be painfree with improving neck and thoracic ROM with use of HEP tools and stretching within PT sessions.  He has met most goals.  He was interested in working on return to  weight training so PT and Pt discussed how his supplemental ther ex needs to compliment his job which requires heavy use of UEs.  PT encouraged ongoing focus of spine ROM and stretching with strength having a postural re-ed focus vs overloading his joints and tasking his muscles that he is already using heavily with his job.  Pt was agreeable to this.  PT advanced some light weights and bands ther ex for scapular and posterior shoulder strength today with good tolerance and form.  D/C likely in the next few visits assuming good response to advanced ther ex today.    Comorbidities PMR, sculptor needing use of bil UEs    PT Frequency 2x / week    PT Duration 8 weeks    PT Treatment/Interventions ADLs/Self Care Home Management;Cryotherapy;Electrical Stimulation;Moist Heat;Traction;Neuromuscular re-education;Therapeutic exercise;Therapeutic activities;Functional mobility training;Patient/family education;Manual techniques;Dry needling;Passive range of motion;Taping;Spinal Manipulations;Joint Manipulations    PT Next Visit Plan f/u on postural re-ed strength from last visit    PT Home Exercise Plan Access Code: [email or web address] +DN info    Consulted and Agree with Plan of Care Patient           Patient will benefit from skilled therapeutic intervention in order to improve the following deficits and impairments:     Visit Diagnosis: Cervicalgia  Muscle weakness (generalized)     Problem List Patient Active Problem List   Diagnosis Date Noted  . Olecranon bursitis of right elbow 05/19/2019  . Yeast infection of the skin 11/05/2017  . Flank pain 11/05/2017  . Urethritis 06/22/2017  . Memory loss 06/22/2017  . Foot pain, right 06/16/2017  . Erectile dysfunction 12/28/2016  . Cough 12/01/2016  . Anxiety 12/01/2016  . Urinary hesitancy 09/30/2016  . MCI (mild cognitive impairment) 07/22/2016  . Staph skin infection 07/22/2016  . Insomnia 04/24/2016  . Urinary frequency 04/24/2016  . Acid reflux  02/03/2016  . Sinusitis 01/29/2016  . 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 Dyspnea 12/29/2014  . Hyperglycemia 12/26/2014  . Pulmonary embolism ([place]) 08/07/2014  . Polymyalgia rheumatica ([place]) 04/25/2014  . Chronic anticoagulation 06/14/2013  . Leukocytosis 05/29/2013  . Greater trochanteric bursitis of right hip 05/16/2013  . Hip pain, right 05/01/2013  . Vitamin D deficiency 10/14/2011  . Hypothyroid 06/10/2011  . Coagulopathy ([place]) 05/16/2011  . 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 Left hip pain 12/17/2010  . Preventative health care 12/17/2010  . DVT (deep venous thrombosis) ([place])   . Allergic state   . Hyperlipidemia     Alene Mires Naileah Karg 04/11/2020, 12:30 PM  [facility] [address], [place], [place], [address] Phone: [phone]   Fax:  [phone]  Name: Daniel Ritthaler MRN: [id number] Date of Birth: 05-17-1945

## 2020-04-16 ENCOUNTER — Encounter: Payer: Self-pay | Admitting: Physical Therapy

## 2020-04-16 ENCOUNTER — Other Ambulatory Visit: Payer: Self-pay

## 2020-04-16 ENCOUNTER — Ambulatory Visit: Payer: Medicare PPO | Admitting: Physical Therapy

## 2020-04-16 DIAGNOSIS — M542 Cervicalgia: Secondary | ICD-10-CM | POA: Diagnosis not present

## 2020-04-16 DIAGNOSIS — M6281 Muscle weakness (generalized): Secondary | ICD-10-CM

## 2020-04-16 NOTE — Therapy (Signed)
[facility] [address] [place], [place], [address] Phone: [phone]   Fax:  [phone]  Physical Therapy Treatment  Patient Details  Name: Paul Ryan MRN: [id number] Date of Birth: 1945-10-28 Referring Provider (PT): Luanna Salk   Encounter Date: 04/16/2020   PT End of Session - 04/16/20 1225    Visit Number 11    Date for PT Re-Evaluation 05/02/20    Authorization Type [employer]    Authorization - Visit Number 11    Authorization - Number of Visits 12    Progress Note Due on Visit 20    PT Start Time 1147    PT Stop Time 1230    PT Time Calculation (min) 43 min    Activity Tolerance Patient tolerated treatment well    Behavior During Therapy [facility] for tasks assessed/performed           Past Medical History:  Diagnosis Date  . Acid reflux 02/03/2016  . Allergy    seasonal  . Anxiety 12/01/2016  . Chronic anticoagulation 06/14/2013  . Coagulopathy ([place]) 05/16/2011  . Collagen vascular disease ([place])   . Cough 12/01/2016  . DVT (deep venous thrombosis) ([name])    takes coumadin  . Dysuria 02/27/2013  . Erectile dysfunction 12/28/2016  . 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 Hyperglycemia 12/26/2014  . Hyperlipidemia   . Hypothyroid 06/10/2011  . Insomnia 04/24/2016  . Left hip pain 12/17/2010  . MCI (mild cognitive impairment) 07/22/2016  . Nasal lesion 07/22/2016  . Polymyalgia ([place]) 03/21/2014  . 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 Preventative health care 12/17/2010  . Shingles 1982   right abdominal wall  . Staph skin infection 07/22/2016  . Thyroid nodule 11/29/2014  . Urinary hesitancy 09/30/2016  . Vitamin D deficiency 10/14/2011    Past Surgical History:  Procedure Laterality Date  . 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   Subjective Assessment - 04/16/20 1155    Subjective I am doing well.   No pain after last visit.  I just feel a little stiff but know that stretching will help.    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 No/denies                             [facility] Adult PT Treatment/Exercise - 04/16/20 0001      Exercises   Exercises Knee/Hip      Lumbar Exercises: Stretches   Piriformis Stretch 1 rep;Left;Right;30 seconds    Piriformis Stretch Limitations edge of mat table, Rt supine for fig 4    Other Lumbar Stretch Exercise large ball rollouts for trunk flexion, flexion w/ SB each way: 3x5" holds    Other Lumbar Stretch Exercise seated thoracic rotation 3x5" holds each way      Lumbar Exercises: Seated   Other Seated Lumbar Exercises thoracic ext over chair x 5 reps at 2 thoracic levels each      Lumbar Exercises: Supine   Dead Bug Limitations arms overhead from 90 deg holding 5lb weights, hooklying      Lumbar Exercises: Quadruped   Madcat/Old Horse 10  reps    Other Quadruped Lumbar Exercises single arm row 5lb alt x 10 reps    Other Quadruped Lumbar Exercises thread needle and thoracic rotation/ext hand on back of head x 5 each      Knee/Hip Exercises: Supine   Other Supine Knee/Hip Exercises bil hip IR/ER x 20 (windshield wipers)      Shoulder Exercises: Supine   Horizontal ABduction Strengthening;Both;15 reps;Theraband    Theraband Level (Shoulder Horizontal ABduction) Level 2 (Red)    Horizontal ABduction Limitations laying on vertical foam roller    External Rotation Strengthening;Both;15 reps;Theraband    Theraband Level (Shoulder External Rotation) Level 2 (Red)    Diagonals Strengthening;Both;10 reps;Theraband    Theraband Level (Shoulder Diagonals) Level 2 (Red)    Other Supine Exercises UE W stretch on vertical foam roller x 10 sec each       Shoulder Exercises: Standing   Row Strengthening;Theraband;20 reps    Row Limitations black band, arms parallel W 1x10 to door and to floor    Other Standing Exercises bent over I, T, Y bil 2lb, 2x5      Shoulder Exercises: Power Futures trader high to low chops bil UEs 1x10 each way                    PT Short Term Goals - 04/09/20 1224      PT SHORT TERM GOAL #1   Title Pt will be ind with initial HEP    Status Achieved      PT SHORT TERM GOAL #2   Title Pt will improve bil neck rotation to 45 deg    Status Achieved      PT SHORT TERM GOAL #3   Title Pt will report reduced neck pain by at least 20%    Status Achieved             PT Long Term Goals - 04/09/20 1224      PT LONG TERM GOAL #1   Title Pt will be ind with advanced HEP and understand how to safely progress    Status On-going      PT LONG TERM GOAL #2   Title Pt will improve bil neck rotation to at least 55 deg and flexion/ext to at least 55 each for improved functional visibility with driving and sculpting projects    Baseline 60 deg bil rotation, neck flexion 55 deg    Status Achieved      PT LONG TERM GOAL #3   Title Pt will improve Lt grip strength to at least 75lb and Lt pincer grip to at least 22 to reach symmetrical strength with Rt side    Baseline Lt grip 73lb, Rt grip 100lb, Lt pincer 16lb    Status On-going      PT LONG TERM GOAL #4   Title Pt will report at least 70% reduction in pain thorughout day    Baseline 70%    Status Achieved      PT LONG TERM GOAL #5   Title Pt will achieve at least 4+/5 strength in bil biceps adn shoulder abductors 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    Status On-going                 Plan - 04/16/20 1235    Clinical Impression Statement Pt continues to do very well with PT.  He reports only mild stiffness vs pain and has adapted to  new approach for HEP to target mobility, ROM, postural re-ed vs heavy weight  training he was doing previously.  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  UE strength has improved.  He will likely be ready to d/c at next visit.    Comorbidities PMR, sculptor needing use of bil UEs    PT Frequency 2x / week    PT Duration 8 weeks    PT Treatment/Interventions ADLs/Self Care Home Management;Cryotherapy;Electrical Stimulation;Moist Heat;Traction;Neuromuscular re-education;Therapeutic exercise;Therapeutic activities;Functional mobility training;Patient/family education;Manual techniques;Dry needling;Passive range of motion;Taping;Spinal Manipulations;Joint Manipulations    PT Next Visit Plan ERO and d/c [place]    PT Home Exercise Plan Access Code: [email or web address] +DN info    Consulted and Agree with Plan of Care Patient           Patient will benefit from skilled therapeutic intervention in order to improve the following deficits and impairments:     Visit Diagnosis: Cervicalgia  Muscle weakness (generalized)     Problem List Patient Active Problem List   Diagnosis Date Noted  . Olecranon bursitis of right elbow 05/19/2019  . Yeast infection of the skin 11/05/2017  . Flank pain 11/05/2017  . Urethritis 06/22/2017  . Memory loss 06/22/2017  . Foot pain, right 06/16/2017  . Erectile dysfunction 12/28/2016  . Cough 12/01/2016  . Anxiety 12/01/2016  . Urinary hesitancy 09/30/2016  . MCI (mild cognitive impairment) 07/22/2016  . Staph skin infection 07/22/2016  . Insomnia 04/24/2016  . Urinary frequency 04/24/2016  . Acid reflux 02/03/2016  . Sinusitis 01/29/2016  . 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 Dyspnea 12/29/2014  . Hyperglycemia 12/26/2014  . Pulmonary embolism ([place]) 08/07/2014  . Polymyalgia rheumatica ([place]) 04/25/2014  . Chronic anticoagulation 06/14/2013  . Leukocytosis 05/29/2013  . Greater trochanteric  bursitis of right hip 05/16/2013  . Hip pain, right 05/01/2013  . Vitamin D deficiency 10/14/2011  . Hypothyroid 06/10/2011  . Coagulopathy ([place]) 05/16/2011  . 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 Left hip pain 12/17/2010  . Preventative health care 12/17/2010  . DVT (deep venous thrombosis) ([place])   . Allergic state   . Hyperlipidemia     Alene Mires Colbert Curenton 04/16/2020, 12:37 PM  [facility] [address], [place], [place], [address] Phone: [phone]   Fax:  [phone]  Name: Paul Ryan MRN: [id number] Date of Birth: Nov 23, 1945

## 2020-04-18 ENCOUNTER — Ambulatory Visit: Payer: Medicare PPO | Admitting: Physical Therapy

## 2020-04-18 ENCOUNTER — Encounter: Payer: Self-pay | Admitting: Physical Therapy

## 2020-04-18 ENCOUNTER — Other Ambulatory Visit: Payer: Self-pay

## 2020-04-18 DIAGNOSIS — M542 Cervicalgia: Secondary | ICD-10-CM | POA: Diagnosis not present

## 2020-04-18 DIAGNOSIS — M6281 Muscle weakness (generalized): Secondary | ICD-10-CM

## 2020-04-18 NOTE — Therapy (Signed)
[facility] [address] [place], [place], [address] Phone: [phone]   Fax:  [phone]  Physical Therapy Treatment  Patient Details  Name: Paul Ryan MRN: [id number] Date of Birth: 1945-07-09 Referring Provider (PT): Luanna Salk   Encounter Date: 04/18/2020   PT End of Session - 04/18/20 1103    Visit Number 12    Date for PT Re-Evaluation 05/02/20    Authorization Type [employer]    Authorization - Visit Number 12    Authorization - Number of Visits 12    Progress Note Due on Visit 20    PT Start Time 1100    PT Stop Time 1145    PT Time Calculation (min) 45 min    Activity Tolerance Patient tolerated treatment well    Behavior During Therapy [facility] for tasks assessed/performed           Past Medical History:  Diagnosis Date  . Acid reflux 02/03/2016  . Allergy    seasonal  . Anxiety 12/01/2016  . Chronic anticoagulation 06/14/2013  . Coagulopathy ([place]) 05/16/2011  . Collagen vascular disease ([place])   . Cough 12/01/2016  . DVT (deep venous thrombosis) ([name])    takes coumadin  . Dysuria 02/27/2013  . Erectile dysfunction 12/28/2016  . 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 Hyperglycemia 12/26/2014  . Hyperlipidemia   . Hypothyroid 06/10/2011  . Insomnia 04/24/2016  . Left hip pain 12/17/2010  . MCI (mild cognitive impairment) 07/22/2016  . Nasal lesion 07/22/2016  . Polymyalgia ([place]) 03/21/2014  . 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 Preventative health care 12/17/2010  . Shingles 1982   right abdominal wall  . Staph skin infection 07/22/2016  . Thyroid nodule 11/29/2014  . Urinary hesitancy 09/30/2016  . Vitamin D deficiency 10/14/2011    Past Surgical History:  Procedure Laterality Date  . 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       [facility] PT Assessment - 04/18/20 0001      Assessment    Medical Diagnosis M35.3 (ICD-10-CM) - Polymyalgia rheumatica ([name]) M19.90 (ICD-10-CM) - Osteoarthritis, unspecified osteoarthritis type, unspecified site    Referring Provider (PT) Marylyn Ishihara, Adela Glimpse    Onset Date/Surgical Date --   1 year ago worsened   Hand Dominance Right    Next MD Visit as needed    Prior Therapy for his back      AROM   Cervical Flexion 15    Cervical Extension 50    Cervical - Right Side Bend 35    Cervical - Left Side Bend 35    Cervical - Right Rotation 65    Cervical - Left Rotation 65      Strength   Overall Strength Comments Lt pincer grip 16lb, Lt grip 75lb (Rt 100lb grip and 22lb pincer)                         [facility] Adult PT Treatment/Exercise - 04/18/20 0001      Exercises   Exercises Neck;Shoulder      Shoulder Exercises: ROM/Strengthening   UBE (Upper Arm Bike) L2.5 3x3 PT present to review goals      Manual Therapy   Manual Therapy Joint mobilization;Soft tissue mobilization;Myofascial release    Joint Mobilization U-joint sideglides on Lt C3-C6 Gr III, rib springing and thoracic PAs throughout  t-spine Gr III    Soft tissue mobilization upper traps, thoracic paraspinals bil    Myofascial Release thoracodorsal fascia bil                    PT Short Term Goals - 04/09/20 1224      PT SHORT TERM GOAL #1   Title Pt will be ind with initial HEP    Status Achieved      PT SHORT TERM GOAL #2   Title Pt will improve bil neck rotation to 45 deg    Status Achieved      PT SHORT TERM GOAL #3   Title Pt will report reduced neck pain by at least 20%    Status Achieved             PT Long Term Goals - 04/18/20 1104      PT LONG TERM GOAL #1   Title Pt will be ind with advanced HEP and understand how to safely progress    Status Achieved      PT LONG TERM GOAL #2   Title Pt will improve bil neck rotation to at least 55 deg and flexion/ext to at least 55 each for improved functional visibility with driving  and sculpting projects    Status Achieved      PT LONG TERM GOAL #3   Title Pt will improve Lt grip strength to at least 75lb and Lt pincer grip to at least 22 to reach symmetrical strength with Rt side    Baseline Lt grip 75lb, Rt grip 100lb, Lt pincer 16lb    Status Partially Met      PT LONG TERM GOAL #4   Title Pt will report at least 70% reduction in pain thorughout day    Status Achieved      PT LONG TERM GOAL #5   Title Pt will achieve at least 4+/5 strength in bil biceps adn shoulder abductors 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    Status Achieved                 Plan - 04/18/20 1247    Clinical Impression Statement Pt has met all ROM goals and most strength goals.  He made partial progress on Lt pincer grip to 16lb compared to 22lb on Rt.  He improved Lt grip strength to 75lb, meeting goal.  He has dramatically improved cervical ROM which he has maintained without need for manual therapy with use of his HEP which he is very compliant with.  PT and Pt discussed and built HEP throughout course of PT that encouraged more spinal ROM, stretching and postural strength vs heavier weight training that he was doing previously.  This new targeted program has helped alleviate some of the multi-joint pain he was experiencing before PT. 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  D/C to HEP for now.    Comorbidities PMR, sculptor needing use of bil UEs    Rehab Potential Good    PT Frequency 2x / week    PT Duration 8 weeks    PT Treatment/Interventions ADLs/Self Care Home Management;Cryotherapy;Electrical Stimulation;Moist Heat;Traction;Neuromuscular re-education;Therapeutic exercise;Therapeutic activities;Functional mobility training;Patient/family education;Manual techniques;Dry needling;Passive range of  motion;Taping;Spinal Manipulations;Joint Manipulations    PT Next Visit Plan d/c to HEP  PT Home Exercise Plan Access Code: [email or web address] [email or web address]    Consulted and Agree with Plan of Care Patient           Patient will benefit from skilled therapeutic intervention in order to improve the following deficits and impairments:     Visit Diagnosis: Cervicalgia  Muscle weakness (generalized)     Problem List Patient Active Problem List   Diagnosis Date Noted  . Olecranon bursitis of right elbow 05/19/2019  . Yeast infection of the skin 11/05/2017  . Flank pain 11/05/2017  . Urethritis 06/22/2017  . Memory loss 06/22/2017  . Foot pain, right 06/16/2017  . Erectile dysfunction 12/28/2016  . Cough 12/01/2016  . Anxiety 12/01/2016  . Urinary hesitancy 09/30/2016  . MCI (mild cognitive impairment) 07/22/2016  . Staph skin infection 07/22/2016  . Insomnia 04/24/2016  . Urinary frequency 04/24/2016  . Acid reflux 02/03/2016  . Sinusitis 01/29/2016  . 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 Dyspnea 12/29/2014  . Hyperglycemia 12/26/2014  . Pulmonary embolism ([place]) 08/07/2014  . Polymyalgia rheumatica ([place]) 04/25/2014  . Chronic anticoagulation 06/14/2013  . Leukocytosis 05/29/2013  . Greater trochanteric bursitis of right hip 05/16/2013  . Hip pain, right 05/01/2013  . Vitamin D deficiency 10/14/2011  . Hypothyroid 06/10/2011  . Coagulopathy ([place]) 05/16/2011  . 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 Left hip pain 12/17/2010  . Preventative health care 12/17/2010  . DVT (deep venous thrombosis) ([place])   . Allergic state   . Hyperlipidemia     PHYSICAL THERAPY DISCHARGE SUMMARY  Visits from Start of Care: 12  Current functional level related to goals / functional outcomes: See above   Remaining deficits: See above   Education / Equipment: See above Plan: Patient agrees to discharge.  Patient  goals were met. Patient is being discharged due to meeting the stated rehab goals.  ?????         Baruch Merl, PT 04/18/20 12:53 PM   [facility] [address] [place], [place], [address] Phone: [phone]   Fax:  [phone]  Name: Alexi Geibel MRN: [id number] Date of Birth: [birth year]

## 2020-04-19 NOTE — Progress Notes (Signed)
I, Wendy Poet, LAT, ATC, am serving as scribe for Dr. Lynne Leader.  Paul Ryan is a 75 y.o. male who presents to [place] at [facility] today for L knee pain.  He was last seen by Dr. Tamala Julian on 06/23/19 for R elbow pain/olecranon bursitis.  Today, pt reports L knee pain x 4 weeks w/ no known MOI.  He locates his pain to the medial aspect of L knee.  L knee swelling: no L knee mechanical symptoms: no Aggravating factors: walking Treatments tried: [place]   Pertinent review of systems: no fever or chills  Relevant historical information: PMR on and off prednisone. On warfarin.    Exam:  BP 110/78 (BP Location: Right Arm, Patient Position: Sitting, Cuff Size: Normal)   Pulse 61   Ht 6' (1.829 m)   Wt 173 lb 12.8 oz (78.8 kg)   SpO2 98%   BMI 23.57 kg/m  General: Well Developed, well nourished, and in no acute distress.   MSK: Left knee normal-appearing Normal motion with crepitation. Tender palpation medial knee and proximal tibia. Nontender otherwise. Stable ligamentous exam. Intact strength. Mildly positive medial McMurray's test.    Lab and Radiology Results  X-ray images left knee obtained today personally and independently interpreted Minimal degenerative changes.  No acute fractures.  No significant acute abnormalities. Await formal radiology review  Diagnostic Limited MSK Ultrasound of: Left knee Quad tendon intact normal. Trace joint effusion superior patellar space. Patellar tendon normal. Small amount of hypoechoic fluid tracks superficial to patellar tendon. Medial joint line: Medial meniscus is partially extruded and degenerative appearing At proximal medial tibia cortical disruption is present with area of hypoechoic change.  Increased vascular activity in this area.  This is consistent with small stress fracture versus stress reaction. Lateral joint line is normal-appearing Posterior knee no Baker's cyst. Impression: Degenerative  medial joint line.  Probable medial meniscus tear. Concern for proximal medial tibial stress fracture versus stress reaction.     Assessment and Plan: 75 y.o. male with 4 weeks of medial knee pain.  Patient has point tenderness at the proximal medial tibia.  Additionally he has ultrasound findings that are consistent with either stress reaction or stress fracture at the proximal medial tibia.  He has risk factors for this including PMNR and frequent steroid use.  Plan to offload the knee with crutches and an order knee scooter.  Recheck in 2 weeks.  If worsening or not improving would consider MRI to further characterize potential cause of knee pain.   PDMP not reviewed this encounter. Orders Placed This Encounter  Procedures  . [place] LIMITED JOINT SPACE STRUCTURES LOW LEFT(NO LINKED CHARGES)    Standing Status:   Future    Number of Occurrences:   1    Standing Expiration Date:   10/18/2020    Order Specific Question:   Reason for Exam (SYMPTOM  OR DIAGNOSIS REQUIRED)    Answer:   left knee pain    Order Specific Question:   Preferred imaging location?    Answer:   [facility]  . DG Knee AP/LAT W/[place] Left    Standing Status:   Future    Number of Occurrences:   1    Standing Expiration Date:   04/20/2021    Order Specific Question:   Reason for Exam (SYMPTOM  OR DIAGNOSIS REQUIRED)    Answer:   left knee pain    Order Specific Question:   Preferred imaging location?  Answer:   [facility]   No orders of the defined types were placed in this encounter.    Discussed warning signs or symptoms. Please see discharge instructions. Patient expresses understanding.   The above documentation has been reviewed and is accurate and complete Lynne Leader, M.D.

## 2020-04-20 ENCOUNTER — Ambulatory Visit: Payer: Medicare PPO | Admitting: Family Medicine

## 2020-04-20 ENCOUNTER — Ambulatory Visit: Payer: Self-pay

## 2020-04-20 ENCOUNTER — Other Ambulatory Visit: Payer: Self-pay

## 2020-04-20 ENCOUNTER — Ambulatory Visit (INDEPENDENT_AMBULATORY_CARE_PROVIDER_SITE_OTHER): Payer: Medicare PPO

## 2020-04-20 VITALS — BP 110/78 | HR 61 | Ht 72.0 in | Wt 173.8 lb

## 2020-04-20 DIAGNOSIS — M84362A Stress fracture, left tibia, initial encounter for fracture: Secondary | ICD-10-CM | POA: Diagnosis not present

## 2020-04-20 DIAGNOSIS — M25562 Pain in left knee: Secondary | ICD-10-CM

## 2020-04-20 NOTE — Patient Instructions (Addendum)
Thank you for coming in today.  Use crutches and or a knee scooter.   You can get both for cheaper at [facility] supply.   Please use voltaren gel up to 4x daily for pain as needed.   Try to avoid much weight bearing on the knee.   Recheck in about 2 weeks.   Continue Vit D.

## 2020-04-23 ENCOUNTER — Encounter: Payer: Medicare PPO | Admitting: Physical Therapy

## 2020-04-23 NOTE — Progress Notes (Signed)
X-ray left knee looks normal to radiology.  No significant bony abnormality seen on x-ray.  Ultrasound was concerning for a stress reaction or stress fracture.  If not improving MRI will be helpful here.

## 2020-04-24 ENCOUNTER — Telehealth: Payer: Self-pay | Admitting: *Deleted

## 2020-04-24 ENCOUNTER — Encounter: Payer: Self-pay | Admitting: Oncology

## 2020-04-24 LAB — POCT INR: INR: 2.7 (ref 2.0–3.0)

## 2020-04-24 MED ORDER — WARFARIN SODIUM 5 MG PO TABS: 5.0000 mg | ORAL_TABLET | Freq: Every day | ORAL | 0 refills | Status: DC

## 2020-04-24 NOTE — Telephone Encounter (Signed)
Paul Ryan out of town and forgot to take his warfarin with him. Will miss dose tonight and tomorrow. He has some Lovenox 120 mg on hand. Should he take this for a couple days till he gets INR back up? During conversation, noted his mychart message w/name of pharmacy to send in script for warfarin 5 mg. Script called in for #5 day supply.

## 2020-04-27 ENCOUNTER — Encounter: Payer: Medicare PPO | Admitting: Physical Therapy

## 2020-04-30 DIAGNOSIS — E039 Hypothyroidism, unspecified: Secondary | ICD-10-CM | POA: Diagnosis not present

## 2020-04-30 DIAGNOSIS — M8949 Other hypertrophic osteoarthropathy, multiple sites: Secondary | ICD-10-CM | POA: Diagnosis not present

## 2020-04-30 DIAGNOSIS — M353 Polymyalgia rheumatica: Secondary | ICD-10-CM | POA: Diagnosis not present

## 2020-04-30 DIAGNOSIS — Z7901 Long term (current) use of anticoagulants: Secondary | ICD-10-CM | POA: Diagnosis not present

## 2020-04-30 DIAGNOSIS — M659 Synovitis and tenosynovitis, unspecified: Secondary | ICD-10-CM | POA: Diagnosis not present

## 2020-04-30 DIAGNOSIS — K219 Gastro-esophageal reflux disease without esophagitis: Secondary | ICD-10-CM | POA: Diagnosis not present

## 2020-04-30 DIAGNOSIS — E559 Vitamin D deficiency, unspecified: Secondary | ICD-10-CM | POA: Diagnosis not present

## 2020-04-30 DIAGNOSIS — M2550 Pain in unspecified joint: Secondary | ICD-10-CM | POA: Diagnosis not present

## 2020-04-30 DIAGNOSIS — M25531 Pain in right wrist: Secondary | ICD-10-CM | POA: Diagnosis not present

## 2020-05-01 ENCOUNTER — Ambulatory Visit: Payer: Medicare PPO | Admitting: Physical Therapy

## 2020-05-03 NOTE — Progress Notes (Signed)
   Rito Ehrlich, am serving as a Education administrator for Dr. Lynne Leader.  Paul Ryan is a 75 y.o. male who presents to [place] at [facility] today for f/u of L medial knee pain/possible proximal medial tibial stress reaction. He was last seen by Dr. Georgina Snell on 04/20/20 and was provided crutches. An order was also placed for a scooter to allow for offloading/un-loading of L LE due to concern for possible stress reaction. Since his last visit, pt reports that he is feeling much better. States that he really just stayed off of it. Patient does state his right ankle is painful every now and then. He said about a month ago he twisted his ankle.   Diagnostic testing: L knee XR- 04/20/20   Pertinent review of systems: no fever or chills  Relevant historical information: History of DVT.  Polymyalgia rheumatica.   Exam:  BP 110/80 (BP Location: Left Arm, Patient Position: Sitting, Cuff Size: Normal)   Pulse 67   Ht 6' (1.829 m)   Wt 174 lb (78.9 kg)   SpO2 97%   BMI 23.60 kg/m  General: Well Developed, well nourished, and in no acute distress.   MSK: Left knee normal-appearing nontender normal motion.  Right ankle normal-appearing Minimally tender ATFL region. Normal motion Slight laxity anterior drawer testing and talar tilt testing. Intact strength.    Lab and Radiology Results  Diagnostic Limited MSK Ultrasound of: Left knee No significant increased vascular activity at prior stress fracture region medial tibia.  Slight area of hyper echoic change superficial to bone cortex indicates callus formation. Impression: Healed stress reaction versus stress fracture.      Assessment and Plan: 75 y.o. male with healed left knee stress fracture versus stress reaction.  Advance activity as tolerated.  Start 50% preinjury workload and advance 10 %/week guided by pain.  Right ankle sprain: Already significantly improved.  Recommend ASO ankle brace and home exercise program  taught in clinic today by ATC.   PDMP not reviewed this encounter. Orders Placed This Encounter  Procedures  . [place] LIMITED JOINT SPACE STRUCTURES LOW RIGHT(NO LINKED CHARGES)    Standing Status:   Future    Number of Occurrences:   1    Standing Expiration Date:   05/04/2021    Order Specific Question:   Reason for Exam (SYMPTOM  OR DIAGNOSIS REQUIRED)    Answer:   Right ankle pain    Order Specific Question:   Preferred imaging location?    Answer:   [facility]   No orders of the defined types were placed in this encounter.    Discussed warning signs or symptoms. Please see discharge instructions. Patient expresses understanding.   The above documentation has been reviewed and is accurate and complete Lynne Leader, M.D.

## 2020-05-04 ENCOUNTER — Other Ambulatory Visit: Payer: Self-pay

## 2020-05-04 ENCOUNTER — Encounter: Payer: Self-pay | Admitting: Family Medicine

## 2020-05-04 ENCOUNTER — Ambulatory Visit: Payer: Medicare PPO | Admitting: Family Medicine

## 2020-05-04 ENCOUNTER — Ambulatory Visit: Payer: Self-pay

## 2020-05-04 VITALS — BP 110/80 | HR 67 | Ht 72.0 in | Wt 174.0 lb

## 2020-05-04 DIAGNOSIS — M25571 Pain in right ankle and joints of right foot: Secondary | ICD-10-CM

## 2020-05-04 DIAGNOSIS — M84362D Stress fracture, left tibia, subsequent encounter for fracture with routine healing: Secondary | ICD-10-CM

## 2020-05-04 NOTE — Patient Instructions (Addendum)
Thank you for coming in today.  Advance activity as tolerated.  Start at about 50% pre-injury workload and increase by about 10% per week.   Use an ankle brace as needed.   Recheck with me as needed.   Please complete the exercises that the athletic trainer went over with you: View at [email or web address] using code: [email or web address]

## 2020-05-21 DIAGNOSIS — M352 Behcet's disease: Secondary | ICD-10-CM | POA: Diagnosis not present

## 2020-06-17 ENCOUNTER — Encounter: Payer: Self-pay | Admitting: Family Medicine

## 2020-06-18 ENCOUNTER — Other Ambulatory Visit: Payer: Self-pay

## 2020-06-18 ENCOUNTER — Inpatient Hospital Stay: Payer: Medicare PPO | Attending: Oncology | Admitting: Oncology

## 2020-06-18 VITALS — BP 140/80 | HR 78 | Temp 98.1°F | Resp 20 | Ht 72.0 in | Wt 174.2 lb

## 2020-06-18 DIAGNOSIS — Z8615 Personal history of latent tuberculosis infection: Secondary | ICD-10-CM | POA: Insufficient documentation

## 2020-06-18 DIAGNOSIS — E039 Hypothyroidism, unspecified: Secondary | ICD-10-CM | POA: Insufficient documentation

## 2020-06-18 DIAGNOSIS — D6859 Other primary thrombophilia: Secondary | ICD-10-CM | POA: Insufficient documentation

## 2020-06-18 DIAGNOSIS — Z86711 Personal history of pulmonary embolism: Secondary | ICD-10-CM | POA: Insufficient documentation

## 2020-06-18 DIAGNOSIS — I82509 Chronic embolism and thrombosis of unspecified deep veins of unspecified lower extremity: Secondary | ICD-10-CM | POA: Diagnosis not present

## 2020-06-18 DIAGNOSIS — Z7901 Long term (current) use of anticoagulants: Secondary | ICD-10-CM | POA: Diagnosis not present

## 2020-06-18 NOTE — Progress Notes (Signed)
  [place] OFFICE PROGRESS NOTE   Diagnosis: Hypercoagulation syndrome  INTERVAL HISTORY:   Mr. Rosencrans returns as scheduled.  He continues Coumadin.  No symptoms of recurrent thrombosis.  He monitors the PT/INR at home.  When last checked on 06/12/2020 the INR returned at 2.0.  He complains of discomfort at the right foot, chiefly the first and second toe.  This has been present for years.  He has seen neurology in the past.  No back pain.  Objective:  Vital signs in last 24 hours:  Blood pressure 140/80, pulse 78, temperature 98.1 F (36.7 C), temperature source Tympanic, resp. rate 20, height 6' (1.829 m), weight 174 lb 3.2 oz (79 kg), SpO2 100 %.    Resp: Lungs clear bilaterally Cardio: Regular rate and rhythm GI: No hepatosplenomegaly Vascular: Bilateral, right greater than left, lower extremity varicosities.  Bilateral femoral and dorsal pedis pulses are intact. Neuro: Sensation is intact to light touch at the right foot   Portacath/PICC-without erythema  Lab Results:  Lab Results  Component Value Date   WBC 8.3 09/20/2019   HGB 14.9 09/20/2019   HCT 44.9 09/20/2019   MCV 88.0 09/20/2019   PLT 249.0 09/20/2019   NEUTROABS 4.6 04/16/2017    CMP  Lab Results  Component Value Date   NA 138 09/20/2019   K 4.7 09/20/2019   CL 103 09/20/2019   CO2 29 09/20/2019   GLUCOSE 100 (H) 09/20/2019   BUN 14 09/20/2019   CREATININE 0.98 09/20/2019   CALCIUM 9.1 09/20/2019   PROT 7.0 09/20/2019   ALBUMIN 4.1 09/20/2019   AST 18 09/20/2019   ALT 18 09/20/2019   ALKPHOS 57 09/20/2019   BILITOT 0.6 09/20/2019   GFRNONAA 50 (L) 09/11/2014   GFRAA 58 (L) 09/11/2014     Medications: I have reviewed the patient's current medications.   Assessment/Plan: 1. Protein S deficiency  Right lower extremity deep vein thrombosis, unprovoked, June 2010 treated with 1 year of Coumadin anticoagulation  Placed back on Coumadin after protein S level returned low while  off of Coumadin  Anticoagulation changed to Xarelto April 2015  Pulmonary embolism 08/07/2014- anticoagulation changed back to Coumadin  2.  History of latent tuberculosis treated with [name] in 2017 3.  Hypothyroidism 4.  History of collagen vascular disease-unspecified 5.  Right abdominal wall zoster 1982   Disposition: Mr. Payson continues Coumadin anticoagulation.  Review of PT/INRs over the past 6 months finds the INR generally in the therapeutic range.  He will continue Coumadin at current dose.  I recommend he follow-up with his neurologist to evaluate the persistent right foot pain.  I doubt this is related to his history of a right lower extremity DVT.  Mr. Munch will return for an office visit in 1 year.  Betsy Coder, MD  06/18/2020  12:44 PM

## 2020-06-19 NOTE — Telephone Encounter (Signed)
Patient scheduled via phone call

## 2020-06-20 ENCOUNTER — Telehealth: Payer: Self-pay | Admitting: Family Medicine

## 2020-06-20 ENCOUNTER — Ambulatory Visit: Payer: Medicare PPO | Admitting: Internal Medicine

## 2020-06-20 ENCOUNTER — Other Ambulatory Visit: Payer: Self-pay

## 2020-06-20 VITALS — BP 123/82 | HR 66 | Temp 97.0°F | Ht 72.0 in | Wt 175.0 lb

## 2020-06-20 DIAGNOSIS — N419 Inflammatory disease of prostate, unspecified: Secondary | ICD-10-CM | POA: Diagnosis not present

## 2020-06-20 DIAGNOSIS — E785 Hyperlipidemia, unspecified: Secondary | ICD-10-CM

## 2020-06-20 DIAGNOSIS — R399 Unspecified symptoms and signs involving the genitourinary system: Secondary | ICD-10-CM | POA: Diagnosis not present

## 2020-06-20 DIAGNOSIS — E039 Hypothyroidism, unspecified: Secondary | ICD-10-CM

## 2020-06-20 DIAGNOSIS — D689 Coagulation defect, unspecified: Secondary | ICD-10-CM

## 2020-06-20 DIAGNOSIS — E559 Vitamin D deficiency, unspecified: Secondary | ICD-10-CM

## 2020-06-20 DIAGNOSIS — R739 Hyperglycemia, unspecified: Secondary | ICD-10-CM

## 2020-06-20 DIAGNOSIS — R809 Proteinuria, unspecified: Secondary | ICD-10-CM

## 2020-06-20 LAB — URINALYSIS
Bilirubin Urine: NEGATIVE
Hgb urine dipstick: NEGATIVE
Ketones, ur: NEGATIVE
Leukocytes,Ua: NEGATIVE
Nitrite: NEGATIVE
Specific Gravity, Urine: 1.015 (ref 1.000–1.030)
Total Protein, Urine: NEGATIVE
Urine Glucose: 500 — AB
Urobilinogen, UA: 0.2 (ref 0.0–1.0)
pH: 6.5 (ref 5.0–8.0)

## 2020-06-20 LAB — POC URINALSYSI DIPSTICK (AUTOMATED)
Bilirubin, UA: NEGATIVE
Blood, UA: NEGATIVE
Glucose, UA: POSITIVE — AB
Ketones, UA: NEGATIVE
Leukocytes, UA: NEGATIVE
Nitrite, UA: NEGATIVE
Protein, UA: POSITIVE — AB
Spec Grav, UA: 1.02 (ref 1.010–1.025)
Urobilinogen, UA: 0.2 E.U./dL
pH, UA: 6 (ref 5.0–8.0)

## 2020-06-20 LAB — PSA: PSA: 4.29 ng/mL — ABNORMAL HIGH (ref 0.10–4.00)

## 2020-06-20 NOTE — Progress Notes (Signed)
Subjective:    Patient ID: Paul Ryan, male    DOB: [birth year], 75 y.o.   MRN: [id number]  DOS:  06/20/2020 Type of visit - description: Acute visit  Symptoms a started approximately 3 weeks ago: Reports dysuria and peculiarly urine odor. This is not the first time this happened.  Denies fever chills No nausea, vomiting.  No abdominal or flank pain Patient is rarely sexually active with his wife only. Denies any penile discharge per se. Foreskin is not irritated. Normal rash. Denies urinary urgency or frequency.  No gross hematuria.   Review of Systems See above   Past Medical History:  Diagnosis Date  . Acid reflux 02/03/2016  . Allergy    seasonal  . Anxiety 12/01/2016  . Chronic anticoagulation 06/14/2013  . Coagulopathy ([place]) 05/16/2011  . Collagen vascular disease ([place])   . Cough 12/01/2016  . DVT (deep venous thrombosis) ([name])    takes coumadin  . Dysuria 02/27/2013  . Erectile dysfunction 12/28/2016  . Hereditary protein S deficiency ([place]) 05/16/2011  . Hyperglycemia 12/26/2014  . Hyperlipidemia   . Hypothyroid 06/10/2011  . Insomnia 04/24/2016  . Left hip pain 12/17/2010  . MCI (mild cognitive impairment) 07/22/2016  . Nasal lesion 07/22/2016  . Polymyalgia ([place]) 03/21/2014  . 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 Preventative health care 12/17/2010  . Shingles 1982   right abdominal wall  . Staph skin infection 07/22/2016  . Thyroid nodule 11/29/2014  . Urinary hesitancy 09/30/2016  . Vitamin D deficiency 10/14/2011    Past Surgical History:  Procedure Laterality Date  . 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 If you have any questions, ask your nurse or doctor.        STOP taking these medications   valACYclovir 500 MG tablet Commonly known as: Valtrex Stopped by: Kathlene November, MD     TAKE these medications   b  complex vitamins tablet Take 1 tablet by mouth daily.   cetirizine 10 MG tablet Commonly known as: ZYRTEC TAKE 1 TABLET BY MOUTH EVERY DAY AS NEEDED FOR ALLERGIES   Cholecalciferol 100 MCG (4000 UT) Tabs Take 1 tablet by mouth daily.   COQ10 PO Take 2 tablets by mouth daily.   Cranberry 250 MG Tabs Take 1 tablet by mouth daily.   DULoxetine 30 MG capsule Commonly known as: CYMBALTA Take 30 mg by mouth 2 (two) times daily.   enoxaparin 120 MG/0.8ML injection Commonly known as: LOVENOX Inject 0.8 mLs (120 mg total) into the skin daily. Use only if INR < 2.0 or as instructed, discontinue when INR 2.0-3.0   hydroquinone 4 % cream Apply topically 2 (two) times daily.   NON FORMULARY AHCC immune support   pantoprazole 40 MG tablet Commonly known as: PROTONIX Take 40 mg by mouth daily.   predniSONE 20 MG tablet Commonly known as: DELTASONE 2 tabs po qd x 3 days then 1.5 tabs po qd x 3 days then 1 tab po qd x 3 days then 1/2 tab po qd   rosuvastatin 10 MG tablet Commonly known as: CRESTOR TAKE 1 TABLET BY MOUTH EVERY DAY   thyroid 60 MG tablet Commonly known as: Armour Thyroid TAKE ONE TABLET BY MOUTH ON MONDAY, WEDNESDAY, THURSDAY, FRIDAY, AND SUNDAY.   thyroid 90  MG tablet Commonly known as: Armour Thyroid TAKE 1 TABLET BY MOUTH BY MOUTH ON [id number]   VITAMIN C ER PO Take by mouth daily.   warfarin 4 MG tablet Commonly known as: COUMADIN Take as directed by the anticoagulation clinic. If you are unsure how to take this medication, talk to your nurse or doctor. Original instructions: TAKE 1 TABLET BY MOUTH DAILY. ADJUST DOSE AS INSTRUCTED   warfarin 5 MG tablet Commonly known as: COUMADIN Take as directed by the anticoagulation clinic. If you are unsure how to take this medication, talk to your nurse or doctor. Original instructions: Take 1 tablet (5 mg total) by mouth daily.          Objective:   Physical Exam BP 123/82 (BP Location: Left Arm,  Patient Position: Sitting, Cuff Size: Large)   Pulse 66   Temp (!) 97 F (36.1 C) (Temporal)   Ht 6' (1.829 m)   Wt 175 lb (79.4 kg)   SpO2 97%   BMI 23.73 kg/m  General:   Well developed, NAD, BMI noted.  HEENT:  Normocephalic . Face symmetric, atraumatic Abdomen:  Not distended, soft, non-tender. No rebound or rigidity.  No CVA tenderness GU: Penis: Uncircumcised, foreskin looks healthy with no rash, erythema or blisters.  Meatus seems small but otherwise normal-appearing. DRE: Normal sphincter tone, prostate is not enlarged, no lower or tender. Skin: Not pale. Not jaundice Lower extremities: no pretibial edema bilaterally  Neurologic:  alert & oriented X3.  Speech normal, gait appropriate for age and unassisted Psych--  Cognition and judgment appear intact.  Cooperative with normal attention span and concentration.  Behavior appropriate. No anxious or depressed appearing.     Assessment     75 year old male, PMH includes DM.,PE, DVT, hypothyroidism, PMR (on prednisone), on Coumadin, presents with:  LUTS, dysuria: As described above low risk for STDs, I reviewed the charts, had similar complaints before. Multiple UAs, UCX negative. On exam today there is no evidence of herpes, urethritis, prostatitis, balanoposthitis. U dip negative. Plan: For completeness we will check a UA, urine culture.  Also a PSA noting that suspicion for prostatitis is low Rec good foreskin hygiene, I noted some post urinary dripping. Further advised with results. Proteinuria: Noted on U dip.  We will see PCP in August, recommend to discuss with her.  This visit occurred during the SARS-CoV-2 public health emergency.  Safety protocols were in place, including screening questions prior to the visit, additional usage of staff PPE, and extensive cleaning of exam room while observing appropriate contact time as indicated for disinfecting solutions.

## 2020-06-20 NOTE — Telephone Encounter (Signed)
Patient would like lab orders place before his next appointment with Dr Charlett Blake. Patient would like to discuss lab results at office visit   Please advise

## 2020-06-20 NOTE — Telephone Encounter (Signed)
Pt lab are ordered

## 2020-06-20 NOTE — Patient Instructions (Signed)
Please go to the lab and provide a urine sample  We will call you with the results

## 2020-06-21 LAB — URINE CULTURE
MICRO NUMBER:: 11792260
SPECIMEN QUALITY:: ADEQUATE

## 2020-06-26 ENCOUNTER — Other Ambulatory Visit: Payer: Self-pay | Admitting: *Deleted

## 2020-06-26 ENCOUNTER — Other Ambulatory Visit: Payer: Self-pay

## 2020-06-26 DIAGNOSIS — R972 Elevated prostate specific antigen [PSA]: Secondary | ICD-10-CM

## 2020-06-26 NOTE — Addendum Note (Signed)
Addended by: Randolm Idol A on: 06/26/2020 11:34 AM   Modules accepted: Orders

## 2020-06-27 DIAGNOSIS — M2550 Pain in unspecified joint: Secondary | ICD-10-CM | POA: Diagnosis not present

## 2020-06-27 DIAGNOSIS — M353 Polymyalgia rheumatica: Secondary | ICD-10-CM | POA: Diagnosis not present

## 2020-06-27 DIAGNOSIS — M5412 Radiculopathy, cervical region: Secondary | ICD-10-CM | POA: Diagnosis not present

## 2020-06-27 DIAGNOSIS — M25531 Pain in right wrist: Secondary | ICD-10-CM | POA: Diagnosis not present

## 2020-06-27 DIAGNOSIS — E039 Hypothyroidism, unspecified: Secondary | ICD-10-CM | POA: Diagnosis not present

## 2020-06-27 DIAGNOSIS — K219 Gastro-esophageal reflux disease without esophagitis: Secondary | ICD-10-CM | POA: Diagnosis not present

## 2020-06-27 DIAGNOSIS — E559 Vitamin D deficiency, unspecified: Secondary | ICD-10-CM | POA: Diagnosis not present

## 2020-06-27 DIAGNOSIS — R5382 Chronic fatigue, unspecified: Secondary | ICD-10-CM | POA: Diagnosis not present

## 2020-06-27 DIAGNOSIS — M8949 Other hypertrophic osteoarthropathy, multiple sites: Secondary | ICD-10-CM | POA: Diagnosis not present

## 2020-07-03 DIAGNOSIS — Z7901 Long term (current) use of anticoagulants: Secondary | ICD-10-CM | POA: Diagnosis not present

## 2020-07-03 DIAGNOSIS — D6859 Other primary thrombophilia: Secondary | ICD-10-CM | POA: Diagnosis not present

## 2020-07-03 DIAGNOSIS — I2699 Other pulmonary embolism without acute cor pulmonale: Secondary | ICD-10-CM | POA: Diagnosis not present

## 2020-07-05 ENCOUNTER — Other Ambulatory Visit: Payer: Self-pay | Admitting: Family Medicine

## 2020-07-05 ENCOUNTER — Encounter: Payer: Self-pay | Admitting: Oncology

## 2020-07-07 ENCOUNTER — Other Ambulatory Visit: Payer: Self-pay | Admitting: Family Medicine

## 2020-07-24 ENCOUNTER — Encounter: Payer: Self-pay | Admitting: Oncology

## 2020-07-25 ENCOUNTER — Encounter: Payer: Self-pay | Admitting: Oncology

## 2020-07-26 ENCOUNTER — Encounter: Payer: Self-pay | Admitting: *Deleted

## 2020-07-27 ENCOUNTER — Other Ambulatory Visit: Payer: Self-pay | Admitting: Family Medicine

## 2020-07-27 ENCOUNTER — Encounter: Payer: Self-pay | Admitting: Family Medicine

## 2020-07-27 DIAGNOSIS — M79671 Pain in right foot: Secondary | ICD-10-CM

## 2020-07-31 ENCOUNTER — Encounter: Payer: Self-pay | Admitting: Oncology

## 2020-08-02 ENCOUNTER — Other Ambulatory Visit: Payer: Self-pay

## 2020-08-02 ENCOUNTER — Ambulatory Visit (INDEPENDENT_AMBULATORY_CARE_PROVIDER_SITE_OTHER): Payer: Medicare PPO

## 2020-08-02 ENCOUNTER — Ambulatory Visit (INDEPENDENT_AMBULATORY_CARE_PROVIDER_SITE_OTHER): Payer: Medicare PPO | Admitting: Podiatry

## 2020-08-02 DIAGNOSIS — M79671 Pain in right foot: Secondary | ICD-10-CM

## 2020-08-02 DIAGNOSIS — M7751 Other enthesopathy of right foot: Secondary | ICD-10-CM

## 2020-08-02 DIAGNOSIS — M779 Enthesopathy, unspecified: Secondary | ICD-10-CM | POA: Diagnosis not present

## 2020-08-02 DIAGNOSIS — G5761 Lesion of plantar nerve, right lower limb: Secondary | ICD-10-CM | POA: Diagnosis not present

## 2020-08-02 DIAGNOSIS — D3610 Benign neoplasm of peripheral nerves and autonomic nervous system, unspecified: Secondary | ICD-10-CM

## 2020-08-02 NOTE — Patient Instructions (Signed)
I have ordered an ultrasound at [facility]. If you do not hear for them about scheduling within the next 1 week, or you have any questions please give [place] a call at [phone].

## 2020-08-06 ENCOUNTER — Encounter: Payer: Self-pay | Admitting: Oncology

## 2020-08-06 DIAGNOSIS — N403 Nodular prostate with lower urinary tract symptoms: Secondary | ICD-10-CM | POA: Diagnosis not present

## 2020-08-06 DIAGNOSIS — R3915 Urgency of urination: Secondary | ICD-10-CM | POA: Diagnosis not present

## 2020-08-06 DIAGNOSIS — N500 Atrophy of testis: Secondary | ICD-10-CM | POA: Diagnosis not present

## 2020-08-06 DIAGNOSIS — R972 Elevated prostate specific antigen [PSA]: Secondary | ICD-10-CM | POA: Diagnosis not present

## 2020-08-07 ENCOUNTER — Other Ambulatory Visit: Payer: Self-pay | Admitting: Family Medicine

## 2020-08-07 NOTE — Progress Notes (Signed)
Subjective:   Patient ID: Paul Ryan, male   DOB: 75 y.o.   MRN: [id number]   HPI 75 year old male presents the office today for evaluation of continued right foot pain.  He states that he gets majority of tenderness and numbness to his second third toes.  He states that is becoming more consistent.  No recent injuries.  No swelling.  He does note some discoloration of the toes at times.  No radiating pain or weakness.  No recent treatments last saw him.  He is no other concerns.   Review of Systems  All other systems reviewed and are negative.  Past Medical History:  Diagnosis Date  . Acid reflux 02/03/2016  . Allergy    seasonal  . Anxiety 12/01/2016  . Chronic anticoagulation 06/14/2013  . Coagulopathy ([place]) 05/16/2011  . Collagen vascular disease ([place])   . Cough 12/01/2016  . DVT (deep venous thrombosis) ([name])    takes coumadin  . Dysuria 02/27/2013  . Erectile dysfunction 12/28/2016  . Hereditary protein S deficiency ([place]) 05/16/2011  . Hyperglycemia 12/26/2014  . Hyperlipidemia   . Hypothyroid 06/10/2011  . Insomnia 04/24/2016  . Left hip pain 12/17/2010  . MCI (mild cognitive impairment) 07/22/2016  . Nasal lesion 07/22/2016  . Polymyalgia ([place]) 03/21/2014  . 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 Preventative health care 12/17/2010  . Shingles 1982   right abdominal wall  . Staph skin infection 07/22/2016  . Thyroid nodule 11/29/2014  . Urinary hesitancy 09/30/2016  . Vitamin D deficiency 10/14/2011    Past Surgical History:  Procedure Laterality Date  . 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  Ascorbic Acid (VITAMIN C ER PO), Take by mouth daily., Disp: , Rfl:  .  b complex vitamins tablet, Take 1 tablet by mouth daily., Disp: , Rfl:  .  cetirizine (ZYRTEC) 10 MG tablet, TAKE 1 TABLET BY MOUTH EVERY DAY AS NEEDED FOR ALLERGIES, Disp: 30 tablet, Rfl: 4 .  Cholecalciferol 4000 UNITS TABS, Take 1 tablet by mouth  daily., Disp: , Rfl:  .  Coenzyme Q10 (COQ10 PO), Take 2 tablets by mouth daily., Disp: , Rfl:  .  Cranberry 250 MG TABS, Take 1 tablet by mouth daily., Disp: , Rfl:  .  DULoxetine (CYMBALTA) 30 MG capsule, Take 30 mg by mouth 2 (two) times daily., Disp: , Rfl:  .  enoxaparin (LOVENOX) 120 MG/0.8ML injection, Inject 0.8 mLs (120 mg total) into the skin daily. Use only if INR < 2.0 or as instructed, discontinue when INR 2.0-3.0, Disp: 30 Syringe, Rfl: 3 .  hydroquinone 4 % cream, Apply topically 2 (two) times daily., Disp: 28.35 g, Rfl: 1 .  NON FORMULARY, AHCC immune support, Disp: , Rfl:  .  pantoprazole (PROTONIX) 40 MG tablet, Take 40 mg by mouth daily., Disp: , Rfl:  .  predniSONE (DELTASONE) 20 MG tablet, 2 tabs po qd x 3 days then 1.5 tabs po qd x 3 days then 1 tab po qd x 3 days then 1/2 tab po qd, Disp: 30 tablet, Rfl: 1 .  rosuvastatin (CRESTOR) 10 MG tablet, TAKE 1 TABLET BY MOUTH EVERY DAY, Disp: 90 tablet, Rfl: 1 .  thyroid (ARMOUR THYROID) 60 MG tablet, TAKE ONE TABLET BY MOUTH ON MONDAY, WEDNESDAY, THURSDAY, FRIDAY, AND SUNDAY., Disp: 65 tablet, Rfl: 0 .  thyroid (ARMOUR THYROID) 90 MG tablet, TAKE ONE TABLET BY MOUTH ON MONDAY, WEDNESDAY, THURSDAY,  FRIDAY, AND SATURDAY, Disp: 30 tablet, Rfl: 2 .  warfarin (COUMADIN) 4 MG tablet, TAKE 1 TABLET BY MOUTH DAILY. ADJUST DOSE AS INSTRUCTED, Disp: 90 tablet, Rfl: 3 .  warfarin (COUMADIN) 5 MG tablet, Take 1 tablet (5 mg total) by mouth daily., Disp: 5 tablet, Rfl: 0  Current Facility-Administered Medications:  .  triamcinolone acetonide (KENALOG) 10 MG/ML injection 10 mg, 10 mg, Other, Once, Kemari Narez, Bonna Gains, DPM  No Known Allergies      Objective:  Physical Exam  General: AAO x3, NAD  Dermatological: Skin is warm, dry and supple bilateral.  There are no open sores, no preulcerative lesions, no rash or signs of infection present.  Vascular: Dorsalis Pedis artery and Posterior Tibial artery pedal pulses are 2/4 bilateral with  immedate capillary fill time. There is no pain with calf compression, swelling, warmth, erythema.   Neruologic: Grossly intact via light touch bilateral.   Musculoskeletal: Tenderness noted along the second interspace right foot.  There is no area of pinpoint tenderness identified.  Small palpable neuroma present versus possible blister.  No area of pinpoint tenderness.  Muscular strength 5/5 in all groups tested bilateral.  Gait: Unassisted, Nonantalgic.      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  There is no evidence of acute fracture or stress fracture. -Discuss steroid injection to but he wished to hold off on this.  Ordered an ultrasound to evaluate interspace to rule out a neuroma.  I dispensed neuroma pad.  Discussed shoe modifications -In regards to the toe discoloration he has good temperature and good capillary refill time to the digits today.  We will monitor this.  Trula Slade DPM

## 2020-08-08 ENCOUNTER — Encounter: Payer: Self-pay | Admitting: Oncology

## 2020-08-09 ENCOUNTER — Telehealth: Payer: Self-pay | Admitting: Podiatry

## 2020-08-09 NOTE — Telephone Encounter (Signed)
Pt called back to follow up on referral for [facility]. I did let him know that we are working on the issue.

## 2020-08-09 NOTE — Telephone Encounter (Signed)
Patient is still waiting to be scheduled with [facility]. I transferred patient to the office to get scheduled.

## 2020-08-11 ENCOUNTER — Encounter: Payer: Self-pay | Admitting: Oncology

## 2020-08-14 ENCOUNTER — Encounter: Payer: Self-pay | Admitting: Oncology

## 2020-08-14 DIAGNOSIS — D6859 Other primary thrombophilia: Secondary | ICD-10-CM | POA: Diagnosis not present

## 2020-08-14 DIAGNOSIS — Z7901 Long term (current) use of anticoagulants: Secondary | ICD-10-CM | POA: Diagnosis not present

## 2020-08-14 DIAGNOSIS — I2699 Other pulmonary embolism without acute cor pulmonale: Secondary | ICD-10-CM | POA: Diagnosis not present

## 2020-08-14 NOTE — Telephone Encounter (Signed)
Patient calling to check on the status of MRI order. Patient called [facility] himself and they told him no order was available to get him scheduled. He was under the impression that someone would be following up with him soon, but hasn't heard anything back. Please advise.

## 2020-08-15 ENCOUNTER — Encounter: Payer: Self-pay | Admitting: Oncology

## 2020-08-15 ENCOUNTER — Telehealth: Payer: Self-pay | Admitting: *Deleted

## 2020-08-15 NOTE — Telephone Encounter (Signed)
Called and spoke with the patient and stated that I have contacted the [facility] and they would be calling patient today to get scheduled. Paul Ryan

## 2020-08-15 NOTE — Telephone Encounter (Signed)
Called and spoke with April at [facility] and I did fax the order over today and April stated that they did not see an order in the system and would call the patient to get scheduled. Lattie Haw

## 2020-08-15 NOTE — Telephone Encounter (Signed)
I spoke with April today from [facility] and sent over the order and they are calling patient to schedule. Lattie Haw

## 2020-08-18 DIAGNOSIS — G250 Essential tremor: Secondary | ICD-10-CM | POA: Insufficient documentation

## 2020-08-23 ENCOUNTER — Encounter: Payer: Self-pay | Admitting: Oncology

## 2020-08-24 ENCOUNTER — Telehealth: Payer: Self-pay | Admitting: Podiatry

## 2020-08-24 ENCOUNTER — Other Ambulatory Visit: Payer: Self-pay | Admitting: Podiatry

## 2020-08-24 NOTE — Telephone Encounter (Signed)
[facility] called and stated that they could not get this patient in at the moment. The x-ray tech that preforms this type of procedure does not have any upcoming avail times. They wanted to know if we could send this patient another facility. This patient has been calling them as well wanting to know why he has not been schedule. [facility] told them we will follow up.

## 2020-08-27 ENCOUNTER — Telehealth: Payer: Self-pay | Admitting: Family Medicine

## 2020-08-27 NOTE — Telephone Encounter (Signed)
Copied from [place] [id number]. Topic: Medicare AWV >> Aug 27, 2020  9:13 AM Harris-Coley, Hannah Beat wrote: Reason for CRM: Left message for patient to schedule Annual Wellness Visit.  Please schedule with Health Nurse Advisor Augustine Radar. at [facility].

## 2020-08-28 ENCOUNTER — Ambulatory Visit: Payer: Medicare PPO | Admitting: Family Medicine

## 2020-08-29 NOTE — Telephone Encounter (Signed)
Dr Jacqualyn Posey is going to put an order in for MRI. Lattie Haw

## 2020-09-04 ENCOUNTER — Encounter: Payer: Self-pay | Admitting: Oncology

## 2020-09-05 ENCOUNTER — Other Ambulatory Visit: Payer: Self-pay | Admitting: Podiatry

## 2020-09-05 DIAGNOSIS — G5761 Lesion of plantar nerve, right lower limb: Secondary | ICD-10-CM

## 2020-09-10 ENCOUNTER — Encounter: Payer: Self-pay | Admitting: Family Medicine

## 2020-09-10 NOTE — Telephone Encounter (Signed)
Patient has an MRI at [facility] on 09/11/2020 at 1:20 pm. Lattie Haw

## 2020-09-11 ENCOUNTER — Ambulatory Visit
Admission: RE | Admit: 2020-09-11 | Discharge: 2020-09-11 | Disposition: A | Discharge status: Home / Self Care | Payer: Medicare PPO | Source: Ambulatory Visit | Attending: Podiatry | Admitting: Podiatry

## 2020-09-11 ENCOUNTER — Other Ambulatory Visit: Payer: Self-pay

## 2020-09-11 DIAGNOSIS — G5761 Lesion of plantar nerve, right lower limb: Secondary | ICD-10-CM

## 2020-09-11 DIAGNOSIS — M79671 Pain in right foot: Secondary | ICD-10-CM | POA: Diagnosis not present

## 2020-09-13 DIAGNOSIS — M5441 Lumbago with sciatica, right side: Secondary | ICD-10-CM | POA: Diagnosis not present

## 2020-09-13 DIAGNOSIS — G8929 Other chronic pain: Secondary | ICD-10-CM | POA: Diagnosis not present

## 2020-09-13 DIAGNOSIS — M5442 Lumbago with sciatica, left side: Secondary | ICD-10-CM | POA: Diagnosis not present

## 2020-09-15 ENCOUNTER — Other Ambulatory Visit: Payer: Medicare PPO

## 2020-09-18 ENCOUNTER — Other Ambulatory Visit (INDEPENDENT_AMBULATORY_CARE_PROVIDER_SITE_OTHER): Payer: Medicare PPO

## 2020-09-18 ENCOUNTER — Encounter: Payer: Self-pay | Admitting: Podiatry

## 2020-09-18 DIAGNOSIS — R739 Hyperglycemia, unspecified: Secondary | ICD-10-CM | POA: Diagnosis not present

## 2020-09-18 DIAGNOSIS — E039 Hypothyroidism, unspecified: Secondary | ICD-10-CM

## 2020-09-18 DIAGNOSIS — E785 Hyperlipidemia, unspecified: Secondary | ICD-10-CM | POA: Diagnosis not present

## 2020-09-18 DIAGNOSIS — D689 Coagulation defect, unspecified: Secondary | ICD-10-CM

## 2020-09-18 DIAGNOSIS — E559 Vitamin D deficiency, unspecified: Secondary | ICD-10-CM | POA: Diagnosis not present

## 2020-09-18 LAB — COMPREHENSIVE METABOLIC PANEL
ALT: 21 U/L (ref 0–53)
AST: 23 U/L (ref 0–37)
Albumin: 4.2 g/dL (ref 3.5–5.2)
Alkaline Phosphatase: 57 U/L (ref 39–117)
BUN: 20 mg/dL (ref 6–23)
CO2: 27 mEq/L (ref 19–32)
Calcium: 9.1 mg/dL (ref 8.4–10.5)
Chloride: 105 mEq/L (ref 96–112)
Creatinine, Ser: 1.26 mg/dL (ref 0.40–1.50)
GFR: 55.57 mL/min — ABNORMAL LOW (ref >60.00)
Glucose, Bld: 99 mg/dL (ref 70–99)
Potassium: 4.2 mEq/L (ref 3.5–5.1)
Sodium: 139 mEq/L (ref 135–145)
Total Bilirubin: 0.8 mg/dL (ref 0.2–1.2)
Total Protein: 7.3 g/dL (ref 6.0–8.3)

## 2020-09-18 LAB — CBC WITH DIFFERENTIAL/PLATELET
Basophils Absolute: 0 10*3/uL (ref 0.0–0.1)
Basophils Relative: 0.4 % (ref 0.0–3.0)
Eosinophils Absolute: 0.3 10*3/uL (ref 0.0–0.7)
Eosinophils Relative: 3.9 % (ref 0.0–5.0)
HCT: 45.1 % (ref 39.0–52.0)
Hemoglobin: 15.1 g/dL (ref 13.0–17.0)
Lymphocytes Relative: 22 % (ref 12.0–46.0)
Lymphs Abs: 1.5 10*3/uL (ref 0.7–4.0)
MCHC: 33.5 g/dL (ref 30.0–36.0)
MCV: 87.7 fl (ref 78.0–100.0)
Monocytes Absolute: 0.6 10*3/uL (ref 0.1–1.0)
Monocytes Relative: 8.6 % (ref 3.0–12.0)
Neutro Abs: 4.5 10*3/uL (ref 1.4–7.7)
Neutrophils Relative %: 65.1 % (ref 43.0–77.0)
Platelets: 218 10*3/uL (ref 150.0–400.0)
RBC: 5.15 Mil/uL (ref 4.22–5.81)
RDW: 14.9 % (ref 11.5–15.5)
WBC: 6.9 10*3/uL (ref 4.0–10.5)

## 2020-09-18 LAB — VITAMIN D 25 HYDROXY (VIT D DEFICIENCY, FRACTURES): VITD: 67.03 ng/mL (ref 30.00–100.00)

## 2020-09-18 LAB — LIPID PANEL
Cholesterol: 282 mg/dL — ABNORMAL HIGH (ref 0–200)
HDL: 33.9 mg/dL — ABNORMAL LOW (ref >39.00)
LDL Cholesterol: 219 mg/dL — ABNORMAL HIGH (ref 0–99)
NonHDL: 247.91
Total CHOL/HDL Ratio: 8
Triglycerides: 143 mg/dL (ref 0.0–149.0)
VLDL: 28.6 mg/dL (ref 0.0–40.0)

## 2020-09-18 LAB — PROTIME-INR
INR: 2.1 ratio — ABNORMAL HIGH (ref 0.8–1.0)
Prothrombin Time: 22.8 s — ABNORMAL HIGH (ref 9.6–13.1)

## 2020-09-18 LAB — TSH: TSH: 1.72 u[IU]/mL (ref 0.35–5.50)

## 2020-09-18 LAB — HEMOGLOBIN A1C: Hgb A1c MFr Bld: 6.7 % — ABNORMAL HIGH (ref 4.6–6.5)

## 2020-09-19 ENCOUNTER — Ambulatory Visit (INDEPENDENT_AMBULATORY_CARE_PROVIDER_SITE_OTHER): Payer: Medicare PPO

## 2020-09-19 ENCOUNTER — Other Ambulatory Visit: Payer: Self-pay | Admitting: Family Medicine

## 2020-09-19 ENCOUNTER — Telehealth: Payer: Self-pay

## 2020-09-19 VITALS — Ht 72.0 in | Wt 175.0 lb

## 2020-09-19 DIAGNOSIS — Z0000 Encounter for general adult medical examination without abnormal findings: Secondary | ICD-10-CM

## 2020-09-19 MED ORDER — ROSUVASTATIN CALCIUM 10 MG PO TABS: 10.0000 mg | ORAL_TABLET | Freq: Every day | ORAL | 1 refills | Status: DC

## 2020-09-19 NOTE — Telephone Encounter (Signed)
Patient states he has lost his bottle of Rosuvastatin. He wants to know if a refill can be sent to the pharmacy.

## 2020-09-19 NOTE — Progress Notes (Signed)
Subjective:   Paul Ryan is a 75 y.o. male who presents for Medicare Annual/Subsequent preventive examination.  I connected with Dquan today by telephone and verified that I am speaking with the correct person using two identifiers. Location patient: home Location provider: work Persons participating in the virtual visit: patient, Marine scientist.    I discussed the limitations, risks, security and privacy concerns of performing an evaluation and management service by telephone and the availability of in person appointments. I also discussed with the patient that there may be a patient responsible charge related to this service. The patient expressed understanding and verbally consented to this telephonic visit.    Interactive audio and video telecommunications were attempted between this provider and patient, however failed, due to patient having technical difficulties OR patient did not have access to video capability.  We continued and completed visit with audio only.  Some vital signs may be absent or patient reported.   Time Spent with patient on telephone encounter: 20 minutes   Review of Systems     Cardiac Risk Factors include: advanced age (>59men, >51 women);dyslipidemia     Objective:    Today's Vitals   09/19/20 1022  Weight: 175 lb (79.4 kg)  Height: 6' (1.829 m)   Body mass index is 23.73 kg/m.  Advanced Directives 09/19/2020 03/07/2020 07/28/2019 06/20/2019 06/22/2018 02/01/2018 11/17/2017  Does Patient Have a Medical Advance Directive? Yes Yes Yes No No Yes Yes  Type of Paramedic of [place];Living will [place];Living will [place];Living will - - [place];Living will [place];Living will  Does patient want to make changes to medical advance directive? - No - Patient declined No - Patient declined - - - No - Patient declined  Copy of [place] in Chart? No  - copy requested No - copy requested No - copy requested - - No - copy requested No - copy requested  Would patient like information on creating a medical advance directive? - - - No - Patient declined No - Patient declined - -    Current Medications (verified) Outpatient Encounter Medications as of 09/19/2020  Medication Sig   Ascorbic Acid (VITAMIN C ER PO) Take by mouth daily.   b complex vitamins tablet Take 1 tablet by mouth daily.   cetirizine (ZYRTEC) 10 MG tablet TAKE 1 TABLET BY MOUTH EVERY DAY AS NEEDED FOR ALLERGIES   Cholecalciferol 4000 UNITS TABS Take 1 tablet by mouth daily.   Coenzyme Q10 (COQ10 PO) Take 2 tablets by mouth daily.   Cranberry 250 MG TABS Take 1 tablet by mouth daily.   DULoxetine (CYMBALTA) 30 MG capsule Take 30 mg by mouth 2 (two) times daily.   enoxaparin (LOVENOX) 120 MG/0.8ML injection Inject 0.8 mLs (120 mg total) into the skin daily. Use only if INR < 2.0 or as instructed, discontinue when INR 2.0-3.0   hydroquinone 4 % cream Apply topically 2 (two) times daily.   NON FORMULARY AHCC immune support   pantoprazole (PROTONIX) 40 MG tablet Take 40 mg by mouth daily.   predniSONE (DELTASONE) 20 MG tablet 2 tabs po qd x 3 days then 1.5 tabs po qd x 3 days then 1 tab po qd x 3 days then 1/2 tab po qd   rosuvastatin (CRESTOR) 10 MG tablet TAKE 1 TABLET BY MOUTH EVERY DAY   thyroid (ARMOUR THYROID) 60 MG tablet TAKE ONE TABLET BY MOUTH ON MONDAY, WEDNESDAY, THURSDAY,  FRIDAY, AND SUNDAY.   thyroid (ARMOUR THYROID) 90 MG tablet TAKE ONE TABLET BY MOUTH ON MONDAY, WEDNESDAY, THURSDAY, FRIDAY, AND SATURDAY   warfarin (COUMADIN) 4 MG tablet TAKE 1 TABLET BY MOUTH DAILY. ADJUST DOSE AS INSTRUCTED   warfarin (COUMADIN) 5 MG tablet Take 1 tablet (5 mg total) by mouth daily.   Facility-Administered Encounter Medications as of 09/19/2020  Medication   triamcinolone acetonide (KENALOG) 10 MG/ML injection 10 mg    Allergies (verified) Patient has no known allergies.    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   Hyperglycemia 12/26/2014   Hyperlipidemia    Hypothyroid 06/10/2011   Insomnia 04/24/2016   Left hip pain 12/17/2010   MCI (mild cognitive impairment) 07/22/2016   Nasal lesion 07/22/2016   Polymyalgia ([place]) 03/21/2014   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   Preventative health care 12/17/2010   Shingles 1982   right abdominal wall   Staph skin infection 07/22/2016   Thyroid nodule 11/29/2014   Urinary hesitancy 09/30/2016   Vitamin D deficiency 10/14/2011   Past Surgical History:  Procedure Laterality Date   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 surgeries   Family History  Problem Relation Age of Onset   Cancer Mother        unknown- everywhere   Cancer Sister        breast   Social History   Socioeconomic History   Marital status: Married    Spouse name: Not on file   Number of children: Not on file   Years of education: Not on file   Highest education level: Not on file  Occupational History   Not on file  Tobacco Use   Smoking status: Former    Packs/day: 1.00    Years: 20.00    Pack years: 20.00    Types: Cigarettes    Quit date: 03/03/1980    Years since quitting: 40.5   Smokeless tobacco: Never  Substance and Sexual Activity   Alcohol use: No    Alcohol/week: 0.0 standard drinks   Drug use: No   Sexual activity: Not on file  Other Topics Concern   Not on file  Social History Narrative   Not on file   Social Determinants of Health   Financial Resource Strain: Low Risk    Difficulty of Paying Living Expenses: Not hard at all  Food  Insecurity: No Food Insecurity   Worried About Charity fundraiser in the Last Year: Never true   Arboriculturist in the Last Year: Never true  Transportation Needs: No Transportation Needs   Lack of Transportation (Medical): No   Lack of Transportation (Non-Medical): No  Physical Activity: Sufficiently Active   Days of Exercise per Week: 7 days   Minutes of Exercise per Session: 50 min  Stress: No Stress Concern Present   Feeling of Stress : Not at all  Social Connections: Moderately Isolated   Frequency of Communication with Friends and Family: More than three times a week   Frequency of Social Gatherings with Friends and Family: More than three times  a week   Attends Religious Services: Never   Active Member of Clubs or Organizations: No   Attends Archivist Meetings: Never   Marital Status: Married    Tobacco Counseling Counseling given: Not Answered   Clinical Intake:  Pre-visit preparation completed: Yes  Pain : No/denies pain     Nutritional Status: BMI of 19-24  Normal Nutritional Risks: None Diabetes: No  How often do you need to have someone help you when you read instructions, pamphlets, or other written materials from your doctor or pharmacy?: 1 - Never  Diabetic?No  Interpreter Needed?: No  Information entered by :: Caroleen Hamman LPN   Activities of Daily Living In your present state of health, do you have any difficulty performing the following activities: 09/19/2020  Hearing? N  Vision? N  Difficulty concentrating or making decisions? N  Walking or climbing stairs? N  Dressing or bathing? N  Doing errands, shopping? N  Preparing Food and eating ? N  Using the Toilet? N  In the past six months, have you accidently leaked urine? N  Do you have problems with loss of bowel control? N  Managing your Medications? N  Managing your Finances? N  Housekeeping or managing your Housekeeping? N  Some recent data might be hidden    Patient  Care Team: [name], MD as PCP - General (Family Medicine) Roena Malady, MD (Ophthalmology) Forde Dandy, PharmD (Inactive) as Pharmacist (Pharmacist)  Indicate any recent Medical Services you may have received from other than [facility] providers in the past year (date may be approximate).     Assessment:   This is a routine wellness examination for Doyle.  Hearing/Vision screen Hearing Screening - Comments:: No issues Vision Screening - Comments:: Last eye exam-2021-Dr. Percell Miller  Dietary issues and exercise activities discussed: Current Exercise Habits: Home exercise routine, Type of exercise: walking, Time (Minutes): 45, Frequency (Times/Week): 7, Weekly Exercise (Minutes/Week): 315, Intensity: Mild, Exercise limited by: None identified   Goals Addressed             This Visit's Progress    Continue to walk daily and eat a healthy diet   On track      Depression Screen PHQ 2/9 Scores 09/19/2020 06/20/2020 07/28/2019 07/28/2019 06/22/2018 02/01/2018 11/17/2017  PHQ - 2 Score 0 0 0 0 0 0 0  PHQ- 9 Score - - - - - 0 -    Fall Risk Fall Risk  09/19/2020 06/20/2020 07/28/2019 06/22/2018 02/01/2018  Falls in the past year? 0 0 0 0 1  Number falls in past yr: 0 0 0 - 0  Injury with Fall? 0 0 0 - 1  Risk for fall due to : - - - - Other (Comment)  Risk for fall due to: Comment - - - - on warfarin  Follow up Falls prevention discussed - Education provided;Falls prevention discussed - Falls prevention discussed    FALL RISK PREVENTION PERTAINING TO THE HOME:  Any stairs in or around the home? Yes  If so, are there any without handrails? No  Home free of loose throw rugs in walkways, pet beds, electrical cords, etc? Yes  Adequate lighting in your home to reduce risk of falls? Yes   ASSISTIVE DEVICES UTILIZED TO PREVENT FALLS:  Life alert? No  Use of a cane, walker or w/c? No  Grab bars in the bathroom? Yes  Shower chair or bench in shower? No  Elevated toilet seat or a  handicapped toilet?  No   TIMED UP AND GO:  Was the test performed? No . Phone visit   Cognitive Function:Normal cognitive status assessed by this Nurse Health Advisor. No abnormalities found.     Montreal Cognitive Assessment  09/21/2017  Visuospatial/ Executive (0/5) 5  Naming (0/3) 3  Attention: Read list of digits (0/2) 2  Attention: Read list of letters (0/1) 1  Attention: Serial 7 subtraction starting at 100 (0/3) 2  Language: Repeat phrase (0/2) 2  Language : Fluency (0/1) 1  Abstraction (0/2) 2  Delayed Recall (0/5) 4  Orientation (0/6) 6  Total 28      Immunizations Immunization History  Administered Date(s) Administered   Hepatitis A 11/09/1998   IPV 11/09/1998   Influenza Split 11/22/2015   Influenza Whole 12/13/2012   Influenza, High Dose Seasonal PF 10/31/2016, 11/01/2018, 11/09/2019   Influenza,inj,Quad PF,6+ Mos 10/25/2013, 12/26/2014   Influenza-Unspecified 12/02/2011, 12/02/2017, 12/02/2017   PFIZER(Purple Top)SARS-COV-2 Vaccination 04/08/2019, 05/02/2019, 12/13/2019   PPD Test 03/23/2015   Pneumococcal Conjugate-13 10/25/2013   Pneumococcal Polysaccharide-23 06/19/2009, 12/26/2014   Td 03/03/1990, 07/01/2001   Tdap 12/17/2010   Typhoid Inactivated 11/09/1998, 03/09/2001   Zoster Recombinat (Shingrix) 11/03/2018, 05/19/2019   Zoster, Live 08/01/2008    TDAP status: Up to date  Flu Vaccine status: Up to date  Pneumococcal vaccine status: Up to date  Covid-19 vaccine status: Completed vaccines  Qualifies for Shingles Vaccine? No   Zostavax completed Yes   Shingrix Completed?: Yes  Screening Tests Health Maintenance  Topic Date Due   FOOT EXAM  Never done   OPHTHALMOLOGY EXAM  Never done   URINE MICROALBUMIN  Never done   COVID-19 Vaccine (4 - Booster for Pfizer series) 04/14/2020   INFLUENZA VACCINE  10/01/2020   TETANUS/TDAP  12/16/2020   HEMOGLOBIN A1C  03/21/2021   Hepatitis C Screening  Completed   PNA vac Low Risk Adult  Completed    Zoster Vaccines- Shingrix  Completed   HPV VACCINES  Aged Out    Health Maintenance  Health Maintenance Due  Topic Date Due   FOOT EXAM  Never done   OPHTHALMOLOGY EXAM  Never done   URINE MICROALBUMIN  Never done   COVID-19 Vaccine (4 - Booster for Pfizer series) 04/14/2020    Colorectal cancer screening: No longer required.   Lung Cancer Screening: (Low Dose CT Chest recommended if Age 6-80 years, 30 pack-year currently smoking OR have quit w/in 15years.) does not qualify.    Additional Screening:  Hepatitis C Screening: Completed 03/30/2015  Vision Screening: Recommended annual ophthalmology exams for early detection of glaucoma and other disorders of the eye. Is the patient up to date with their annual eye exam?  Yes  Who is the provider or what is the name of the office in which the patient attends annual eye exams? Dr. Percell Miller   Dental Screening: Recommended annual dental exams for proper oral hygiene  Community Resource Referral / Chronic Care Management: CRR required this visit?  No   CCM required this visit?  No      Plan:     I have personally reviewed and noted the following in the patient's chart:   Medical and social history Use of alcohol, tobacco or illicit drugs  Current medications and supplements including opioid prescriptions. Patient is not currently taking opioid prescriptions. Functional ability and status Nutritional status Physical activity Advanced directives List of other physicians Hospitalizations, surgeries, and ER visits in previous 12 months Vitals Screenings to include cognitive, depression, and  falls Referrals and appointments  In addition, I have reviewed and discussed with patient certain preventive protocols, quality metrics, and best practice recommendations. A written personalized care plan for preventive services as well as general preventive health recommendations were provided to patient.   Due to this being a  telephonic visit, the after visit summary with patients personalized plan was offered to patient via mail or my-chart.  Patient would like to access on my-chart.   Marta Antu, LPN   [id number]  Nurse Health Advisor  Nurse Notes: None

## 2020-09-19 NOTE — Patient Instructions (Signed)
[name] , Thank you for taking time to come for your Medicare Wellness Visit. I appreciate your ongoing commitment to your health goals. Please review the following plan we discussed and let me know if I can assist you in the future.   Screening recommendations/referrals: Colonoscopy: Discuss with GI Recommended yearly ophthalmology/optometry visit for glaucoma screening and checkup Recommended yearly dental visit for hygiene and checkup  Vaccinations: Influenza vaccine: Up to date Pneumococcal vaccine: Up to date Tdap vaccine: Up to date-Due-12/16/2020 Shingles vaccine: Completed vaccines   Covid-19: Up to date  Advanced directives: Please bring a copy for your chart  Conditions/risks identified: See problem list  Next appointment: Follow up in one year for your annual wellness visit.   Preventive Care 15 Years and Older, Male Preventive care refers to lifestyle choices and visits with your health care provider that can promote health and wellness. What does preventive care include? A yearly physical exam. This is also called an annual well check. Dental exams once or twice a year. Routine eye exams. Ask your health care provider how often you should have your eyes checked. Personal lifestyle choices, including: Daily care of your teeth and gums. Regular physical activity. Eating a healthy diet. Avoiding tobacco and drug use. Limiting alcohol use. Practicing safe sex. Taking low doses of aspirin every day. Taking vitamin and mineral supplements as recommended by your health care provider. What happens during an annual well check? The services and screenings done by your health care provider during your annual well check will depend on your age, overall health, lifestyle risk factors, and family history of disease. Counseling  Your health care provider may ask you questions about your: Alcohol use. Tobacco use. Drug use. Emotional well-being. Home and relationship  well-being. Sexual activity. Eating habits. History of falls. Memory and ability to understand (cognition). Work and work Statistician. Screening  You may have the following tests or measurements: Height, weight, and BMI. Blood pressure. Lipid and cholesterol levels. These may be checked every 5 years, or more frequently if you are over 38 years old. Skin check. Lung cancer screening. You may have this screening every year starting at age 33 if you have a 30-pack-year history of smoking and currently smoke or have quit within the past 15 years. Fecal occult blood test (FOBT) of the stool. You may have this test every year starting at age 67. Flexible sigmoidoscopy or colonoscopy. You may have a sigmoidoscopy every 5 years or a colonoscopy every 10 years starting at age 1. Prostate cancer screening. Recommendations will vary depending on your family history and other risks. Hepatitis C blood test. Hepatitis B blood test. Sexually transmitted disease (STD) testing. Diabetes screening. This is done by checking your blood sugar (glucose) after you have not eaten for a while (fasting). You may have this done every 1-3 years. Abdominal aortic aneurysm (AAA) screening. You may need this if you are a current or former smoker. Osteoporosis. You may be screened starting at age 49 if you are at high risk. Talk with your health care provider about your test results, treatment options, and if necessary, the need for more tests. Vaccines  Your health care provider may recommend certain vaccines, such as: Influenza vaccine. This is recommended every year. Tetanus, diphtheria, and acellular pertussis (Tdap, Td) vaccine. You may need a Td booster every 10 years. Zoster vaccine. You may need this after age 17. Pneumococcal 13-valent conjugate (PCV13) vaccine. One dose is recommended after age 80. Pneumococcal polysaccharide (PPSV23) vaccine.  One dose is recommended after age 77. Talk to your health care  provider about which screenings and vaccines you need and how often you need them. This information is not intended to replace advice given to you by your health care provider. Make sure you discuss any questions you have with your health care provider. Document Released: 03/16/2015 Document Revised: 11/07/2015 Document Reviewed: 12/19/2014 Elsevier Interactive Patient Education  2017 [place] Prevention in the Home Falls can cause injuries. They can happen to people of all ages. There are many things you can do to make your home safe and to help prevent falls. What can I do on the outside of my home? Regularly fix the edges of walkways and driveways and fix any cracks. Remove anything that might make you trip as you walk through a door, such as a raised step or threshold. Trim any bushes or trees on the path to your home. Use bright outdoor lighting. Clear any walking paths of anything that might make someone trip, such as rocks or tools. Regularly check to see if handrails are loose or broken. Make sure that both sides of any steps have handrails. Any raised decks and porches should have guardrails on the edges. Have any leaves, snow, or ice cleared regularly. Use sand or salt on walking paths during winter. Clean up any spills in your garage right away. This includes oil or grease spills. What can I do in the bathroom? Use night lights. Install grab bars by the toilet and in the tub and shower. Do not use towel bars as grab bars. Use non-skid mats or decals in the tub or shower. If you need to sit down in the shower, use a plastic, non-slip stool. Keep the floor dry. Clean up any water that spills on the floor as soon as it happens. Remove soap buildup in the tub or shower regularly. Attach bath mats securely with double-sided non-slip rug tape. Do not have throw rugs and other things on the floor that can make you trip. What can I do in the bedroom? Use night lights. Make  sure that you have a light by your bed that is easy to reach. Do not use any sheets or blankets that are too big for your bed. They should not hang down onto the floor. Have a firm chair that has side arms. You can use this for support while you get dressed. Do not have throw rugs and other things on the floor that can make you trip. What can I do in the kitchen? Clean up any spills right away. Avoid walking on wet floors. Keep items that you use a lot in easy-to-reach places. If you need to reach something above you, use a strong step stool that has a grab bar. Keep electrical cords out of the way. Do not use floor polish or wax that makes floors slippery. If you must use wax, use non-skid floor wax. Do not have throw rugs and other things on the floor that can make you trip. What can I do with my stairs? Do not leave any items on the stairs. Make sure that there are handrails on both sides of the stairs and use them. Fix handrails that are broken or loose. Make sure that handrails are as long as the stairways. Check any carpeting to make sure that it is firmly attached to the stairs. Fix any carpet that is loose or worn. Avoid having throw rugs at the top or bottom of  the stairs. If you do have throw rugs, attach them to the floor with carpet tape. Make sure that you have a light switch at the top of the stairs and the bottom of the stairs. If you do not have them, ask someone to add them for you. What else can I do to help prevent falls? Wear shoes that: Do not have high heels. Have rubber bottoms. Are comfortable and fit you well. Are closed at the toe. Do not wear sandals. If you use a stepladder: Make sure that it is fully opened. Do not climb a closed stepladder. Make sure that both sides of the stepladder are locked into place. Ask someone to hold it for you, if possible. Clearly mark and make sure that you can see: Any grab bars or handrails. First and last steps. Where the  edge of each step is. Use tools that help you move around (mobility aids) if they are needed. These include: Canes. Walkers. Scooters. Crutches. Turn on the lights when you go into a dark area. Replace any light bulbs as soon as they burn out. Set up your furniture so you have a clear path. Avoid moving your furniture around. If any of your floors are uneven, fix them. If there are any pets around you, be aware of where they are. Review your medicines with your doctor. Some medicines can make you feel dizzy. This can increase your chance of falling. Ask your doctor what other things that you can do to help prevent falls. This information is not intended to replace advice given to you by your health care provider. Make sure you discuss any questions you have with your health care provider. Document Released: 12/14/2008 Document Revised: 07/26/2015 Document Reviewed: 03/24/2014 Elsevier Interactive Patient Education  2017 [employer].

## 2020-09-21 ENCOUNTER — Ambulatory Visit: Payer: Medicare PPO | Admitting: Podiatry

## 2020-09-21 ENCOUNTER — Other Ambulatory Visit: Payer: Self-pay

## 2020-09-21 ENCOUNTER — Encounter: Payer: Self-pay | Admitting: Podiatry

## 2020-09-21 DIAGNOSIS — M7751 Other enthesopathy of right foot: Secondary | ICD-10-CM

## 2020-09-21 DIAGNOSIS — G5761 Lesion of plantar nerve, right lower limb: Secondary | ICD-10-CM

## 2020-09-21 DIAGNOSIS — M79671 Pain in right foot: Secondary | ICD-10-CM | POA: Diagnosis not present

## 2020-09-21 MED ORDER — TRIAMCINOLONE ACETONIDE 10 MG/ML IJ SUSP
10.0000 mg | Freq: Once | INTRAMUSCULAR | Status: AC
  Administered 2020-09-21: 10 mg

## 2020-09-24 ENCOUNTER — Ambulatory Visit: Payer: Medicare PPO | Admitting: Family Medicine

## 2020-09-24 ENCOUNTER — Other Ambulatory Visit: Payer: Self-pay

## 2020-09-24 DIAGNOSIS — E559 Vitamin D deficiency, unspecified: Secondary | ICD-10-CM

## 2020-09-24 DIAGNOSIS — F419 Anxiety disorder, unspecified: Secondary | ICD-10-CM

## 2020-09-24 DIAGNOSIS — M542 Cervicalgia: Secondary | ICD-10-CM

## 2020-09-24 DIAGNOSIS — G609 Hereditary and idiopathic neuropathy, unspecified: Secondary | ICD-10-CM | POA: Diagnosis not present

## 2020-09-24 DIAGNOSIS — R739 Hyperglycemia, unspecified: Secondary | ICD-10-CM | POA: Diagnosis not present

## 2020-09-24 DIAGNOSIS — M353 Polymyalgia rheumatica: Secondary | ICD-10-CM | POA: Diagnosis not present

## 2020-09-24 DIAGNOSIS — E785 Hyperlipidemia, unspecified: Secondary | ICD-10-CM

## 2020-09-24 DIAGNOSIS — N400 Enlarged prostate without lower urinary tract symptoms: Secondary | ICD-10-CM

## 2020-09-24 DIAGNOSIS — E039 Hypothyroidism, unspecified: Secondary | ICD-10-CM

## 2020-09-24 DIAGNOSIS — G3184 Mild cognitive impairment, so stated: Secondary | ICD-10-CM

## 2020-09-24 DIAGNOSIS — E1140 Type 2 diabetes mellitus with diabetic neuropathy, unspecified: Secondary | ICD-10-CM

## 2020-09-24 DIAGNOSIS — R296 Repeated falls: Secondary | ICD-10-CM

## 2020-09-24 DIAGNOSIS — W19XXXA Unspecified fall, initial encounter: Secondary | ICD-10-CM

## 2020-09-24 DIAGNOSIS — M79671 Pain in right foot: Secondary | ICD-10-CM

## 2020-09-24 LAB — MICROALBUMIN / CREATININE URINE RATIO
Creatinine,U: 161.6 mg/dL
Microalb Creat Ratio: 0.5 mg/g (ref 0.0–30.0)
Microalb, Ur: 0.9 mg/dL (ref 0.0–1.9)

## 2020-09-24 NOTE — Progress Notes (Signed)
Subjective:   By signing my name below, I, Paul Ryan, attest that this documentation has been prepared under the direction and in the presence of [name], MD 09/24/2020   Patient ID: Unknown Foley, male    DOB: Jan 09, 1946, 75 y.o.   MRN: [id number]  No chief complaint on file.   HPI Patient is in today for an office visit.  He reports that he is doing well.  He has been managing his depression with 30 mg Cymbalta 2x PO daily  and reports he is doing well on it.   He went to see his podiatrist recently and was told he has neuropathy in his right foot. He is experiencing pain at the base of the second and third toes which radiates upwards. He has ben managing the pain with hydrocortisone. His memory has been worsening and he mentions he has a hard time finding words and remembering things. He also mentions falling on stairs once every 2 weeks but he is making a conscious effort to raise his feet higher. He also has been experiencing some neck and back pain that was dormant for 15 years. He also had some back ulcers 4 months ago.He is interested in seeing a physical therapist and setting up an appointment. He has been managing his diabetes with a healthy diet and his sugar levels are doing well. He was managing his cholesterol levels with 10 mg Crestor PO daily but mentions he stopped using it for a while. After his lab screening, he realized that his levels increased. He called for a prescription refill. He has 4 Covid-19 vaccines at this time. He is UTD on pneumonia vaccines.  He denies urinary problems.   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   28 surgeries    Family History  Problem Relation Age of Onset   Cancer Mother        unknown- everywhere   Cancer Sister        breast    Social History   Socioeconomic History   Marital status: Married    Spouse name: Not on file   Number of children: Not on file   Years of education: Not on file   Highest education level: Not on file  Occupational History   Not on file  Tobacco Use   Smoking status: Former    Packs/day: 1.00    Years: 20.00    Pack years: 20.00    Types: Cigarettes    Quit date: 03/03/1980  Years since quitting: 40.6   Smokeless tobacco: Never  Substance and Sexual Activity   Alcohol use: No    Alcohol/week: 0.0 standard drinks   Drug use: No   Sexual activity: Not on file  Other Topics Concern   Not on file  Social History Narrative   Not on file   Social Determinants of Health   Financial Resource Strain: Low Risk    Difficulty of Paying Living Expenses: Not hard at all  Food Insecurity: No Food Insecurity   Worried About Charity fundraiser in the Last Year: Never true   [place] in the Last Year: Never true  Transportation Needs: No Transportation Needs   Lack of Transportation (Medical): No   Lack of Transportation (Non-Medical): No  Physical Activity: Sufficiently Active   Days of Exercise per Week: 7 days   Minutes of Exercise per Session: 50 min   Stress: No Stress Concern Present   Feeling of Stress : Not at all  Social Connections: Moderately Isolated   Frequency of Communication with Friends and Family: More than three times a week   Frequency of Social Gatherings with Friends and Family: More than three times a week   Attends Religious Services: Never   Marine scientist or Organizations: No   Attends Music therapist: Never   Marital Status: Married  Human resources officer Violence: Not At Risk   Fear of Current or Ex-Partner: No   Emotionally Abused: No   Physically Abused: No   Sexually Abused: No    Outpatient Medications Prior to Visit  Medication Sig Dispense Refill   Ascorbic Acid (VITAMIN C ER PO) Take by mouth daily.     b complex vitamins tablet Take 1 tablet by mouth daily.     cetirizine (ZYRTEC) 10 MG tablet TAKE 1 TABLET BY MOUTH EVERY DAY AS NEEDED FOR ALLERGIES 30 tablet 4   Cholecalciferol 4000 UNITS TABS Take 1 tablet by mouth daily.     Coenzyme Q10 (COQ10 PO) Take 2 tablets by mouth daily.     Cranberry 250 MG TABS Take 1 tablet by mouth daily.     DULoxetine (CYMBALTA) 30 MG capsule Take 30 mg by mouth 2 (two) times daily.     enoxaparin (LOVENOX) 120 MG/0.8ML injection Inject 0.8 mLs (120 mg total) into the skin daily. Use only if INR < 2.0 or as instructed, discontinue when INR 2.0-3.0 30 Syringe 3   hydroquinone 4 % cream Apply topically 2 (two) times daily. 28.35 g 1   levofloxacin (LEVAQUIN) 750 MG tablet TAKE 1 TABLET BY MOUTH 1 HOUR PRIOR TO THE PROCEDURE     NON FORMULARY [facility] immune support     pantoprazole (PROTONIX) 40 MG tablet Take 40 mg by mouth daily.     predniSONE (DELTASONE) 1 MG tablet Take 1 tablet (1 mg total) by mouth daily with breakfast.     rosuvastatin (CRESTOR) 10 MG tablet Take 1 tablet (10 mg total) by mouth daily. 90 tablet 1   thyroid (ARMOUR THYROID) 60 MG tablet TAKE ONE TABLET BY MOUTH ON MONDAY, WEDNESDAY, THURSDAY, FRIDAY, AND SUNDAY. 65 tablet 0    thyroid (ARMOUR THYROID) 90 MG tablet TAKE ONE TABLET BY MOUTH ON MONDAY, WEDNESDAY, THURSDAY, FRIDAY, AND SATURDAY 30 tablet 2   warfarin (COUMADIN) 4 MG tablet TAKE 1 TABLET BY MOUTH DAILY. ADJUST DOSE AS INSTRUCTED 90 tablet 3   warfarin (COUMADIN) 5 MG tablet Take 1 tablet (5 mg total) by  mouth daily. 5 tablet 0   predniSONE (DELTASONE) 20 MG tablet 2 tabs po qd x 3 days then 1.5 tabs po qd x 3 days then 1 tab po qd x 3 days then 1/2 tab po qd 30 tablet 1   DULoxetine (CYMBALTA) 30 MG capsule Take by mouth.     No facility-administered medications prior to visit.    No Known Allergies  Review of Systems  Musculoskeletal:  Positive for falls and joint pain (second and third toes).  Psychiatric/Behavioral:  Positive for memory loss.       Objective:    Physical Exam Constitutional:      General: He is not in acute distress. HENT:     Head: Normocephalic and atraumatic.     Right Ear: External ear normal.     Left Ear: External ear normal.  Eyes:     Conjunctiva/sclera: Conjunctivae normal.  Cardiovascular:     Rate and Rhythm: Normal rate and regular rhythm.     Heart sounds: Normal heart sounds. No murmur heard. Pulmonary:     Effort: No respiratory distress.     Breath sounds: Normal breath sounds.  Abdominal:     General: Bowel sounds are normal. There is no distension.     Palpations: Abdomen is soft.     Tenderness: There is no abdominal tenderness.  Musculoskeletal:     Cervical back: Neck supple.  Lymphadenopathy:     Cervical: No cervical adenopathy.  Skin:    General: Skin is warm and dry.  Neurological:     Mental Status: He is alert and oriented to person, place, and time.  Psychiatric:        Behavior: Behavior normal.    There were no vitals taken for this visit. Wt Readings from Last 3 Encounters:  09/19/20 175 lb (79.4 kg)  06/20/20 175 lb (79.4 kg)  06/18/20 174 lb 3.2 oz (79 kg)    Diabetic Foot Exam - Simple   No data filed    Lab  Results  Component Value Date   WBC 6.9 09/18/2020   HGB 15.1 09/18/2020   HCT 45.1 09/18/2020   PLT 218.0 09/18/2020   GLUCOSE 99 09/18/2020   CHOL 282 (H) 09/18/2020   TRIG 143.0 09/18/2020   HDL 33.90 (L) 09/18/2020   LDLDIRECT 158.0 03/11/2013   LDLCALC 219 (H) 09/18/2020   ALT 21 09/18/2020   AST 23 09/18/2020   NA 139 09/18/2020   K 4.2 09/18/2020   CL 105 09/18/2020   CREATININE 1.26 09/18/2020   BUN 20 09/18/2020   CO2 27 09/18/2020   TSH 1.72 09/18/2020   PSA 4.29 (H) 06/20/2020   INR 2.1 (H) 09/18/2020   HGBA1C 6.7 (H) 09/18/2020   MICROALBUR 0.9 09/24/2020    Lab Results  Component Value Date   TSH 1.72 09/18/2020   Lab Results  Component Value Date   WBC 6.9 09/18/2020   HGB 15.1 09/18/2020   HCT 45.1 09/18/2020   MCV 87.7 09/18/2020   PLT 218.0 09/18/2020   Lab Results  Component Value Date   NA 139 09/18/2020   K 4.2 09/18/2020   CHLORIDE 109 11/16/2012   CO2 27 09/18/2020   GLUCOSE 99 09/18/2020   BUN 20 09/18/2020   CREATININE 1.26 09/18/2020   BILITOT 0.8 09/18/2020   ALKPHOS 57 09/18/2020   AST 23 09/18/2020   ALT 21 09/18/2020   PROT 7.3 09/18/2020   ALBUMIN 4.2 09/18/2020   CALCIUM 9.1 09/18/2020  ANIONGAP 8 09/11/2014   GFR 55.57 (L) 09/18/2020   Lab Results  Component Value Date   CHOL 282 (H) 09/18/2020   Lab Results  Component Value Date   HDL 33.90 (L) 09/18/2020   Lab Results  Component Value Date   LDLCALC 219 (H) 09/18/2020   Lab Results  Component Value Date   TRIG 143.0 09/18/2020   Lab Results  Component Value Date   CHOLHDL 8 09/18/2020   Lab Results  Component Value Date   HGBA1C 6.7 (H) 09/18/2020       Assessment & Plan:   Problem List Items Addressed This Visit     Hyperlipidemia    Tolerating statin, encouraged heart healthy diet, avoid trans fats, minimize simple carbs and saturated fats. Increase exercise as tolerated       Relevant Orders   Lipid panel   Hypothyroid    On  Levothyroxine, continue to monitor       Relevant Orders   TSH   Vitamin D deficiency    Supplement and monitor       Relevant Orders   VITAMIN D 25 Hydroxy (Vit-D Deficiency, Fractures)   Polymyalgia rheumatica ([name])    No recent flares or concerns, no changes       Hyperglycemia    hgba1c acceptable, minimize simple carbs. Increase exercise as tolerated.        MCI (mild cognitive impairment)    Manages his ADLs well and his decrease in memory is mild and stable.        Anxiety    Doing well on Duloxetine 30 mg po bid.        Foot pain, right    Has been seen by Dr Jacqualyn Posey of podiatry but his pain persists. He is considering seeking care in [place] for this.        Idiopathic peripheral neuropathy   Relevant Orders   CBC   Vitamin B12   Vitamin B1   Folate   BPH (benign prostatic hyperplasia)    Follows with Urology, dr Roni Bread, he reports his last PSA was 4.29       Neck pain    Encouraged moist heat and gentle stretching as tolerated. May try NSAIDs and prescription meds as directed and report if symptoms worsen or seek immediate care. Is following with [facility], Encouraged moist heat and gentle stretching as tolerated. May try NSAIDs and prescription meds as directed and report if symptoms worsen or seek immediate care       Other Visit Diagnoses     Type 2 diabetes mellitus with diabetic neuropathy, without long-term current use of insulin ([place])    -  Primary   Relevant Orders   Microalbumin / creatinine urine ratio (Completed)   Hemoglobin A1c   Comprehensive metabolic panel   Fall, initial encounter       Relevant Orders   CBC        No orders of the defined types were placed in this encounter.   Claude Manges, MD , personally preformed the services described in this documentation.  All medical record entries made by the scribe were at my direction and in my presence.  I have reviewed the chart and discharge  instructions (if applicable) and agree that the record reflects my personal performance and is accurate and complete. 09/24/2020   I,Paul Ryan,acting as a scribe for Penni Homans, MD.,have documented all relevant documentation on the behalf of Penni Homans, MD,as  directed by  Penni Homans, MD while in the presence of Penni Homans, MD.    Penni Homans, MD

## 2020-09-24 NOTE — Patient Instructions (Addendum)
Paxlovid and Molnupiravir are the new COVID medication we can give you if you get COVID so make sure you test if you have symptoms because we have to treat by day 5 of symptoms for it to be effective. If you are positive let us know so we can treat. If a home test is negative and your symptoms are persistent get a PCR test. Can check testing locations at [email or web address] If you are positive we will make an appointment with [place] and we will send in Paxlovid if you would like it. Check with your pharmacy before we meet to confirm they have it in stock, if they do not then we can get the prescription at the [place]    High Cholesterol  High cholesterol is a condition in which the blood has high levels of a white, waxy substance similar to fat (cholesterol). The liver makes all the cholesterol that the body needs. The human body needs small amounts of cholesterol to help build cells. A person gets extra orexcess cholesterol from the food that he or she eats. The blood carries cholesterol from the liver to the rest of the body. If you have high cholesterol, deposits (plaques) may build up on the walls of your arteries. Arteries are the blood vessels that carry blood away from your heart. These plaques make the arteries narrowand stiff. Cholesterol plaques increase your risk for heart attack and stroke. Work withyour health care provider to keep your cholesterol levels in a healthy range. What increases the risk? The following factors may make you more likely to develop this condition: Eating foods that are high in animal fat (saturated fat) or cholesterol. Being overweight. Not getting enough exercise. A family history of high cholesterol (familial hypercholesterolemia). Use of tobacco products. Having diabetes. What are the signs or symptoms? There are no symptoms of this condition. How is this diagnosed? This condition may be diagnosed based on the results of a blood test. If  you are older than 76 years of age, your health care provider may check your cholesterol levels every 4-6 years. You may be checked more often if you have high cholesterol or other risk factors for heart disease. The blood test for cholesterol measures: "Bad" cholesterol, or LDL cholesterol. This is the main type of cholesterol that causes heart disease. The desired level is less than 100 mg/dL. "Good" cholesterol, or HDL cholesterol. HDL helps protect against heart disease by cleaning the arteries and carrying the LDL to the liver for processing. The desired level for HDL is 60 mg/dL or higher. Triglycerides. These are fats that your body can store or burn for energy. The desired level is less than 150 mg/dL. Total cholesterol. This measures the total amount of cholesterol in your blood and includes LDL, HDL, and triglycerides. The desired level is less than 200 mg/dL. How is this treated? This condition may be treated with: Diet changes. You may be asked to eat foods that have more fiber and less saturated fats or added sugar. Lifestyle changes. These may include regular exercise, maintaining a healthy weight, and quitting use of tobacco products. Medicines. These are given when diet and lifestyle changes have not worked. You may be prescribed a statin medicine to help lower your cholesterol levels. Follow these instructions at home: Eating and drinking  Eat a healthy, balanced diet. This diet includes: Daily servings of a variety of fresh, frozen, or canned fruits and vegetables. Daily servings of whole grain foods that are rich in  fiber. Foods that are low in saturated fats and trans fats. These include poultry and fish without skin, lean cuts of meat, and low-fat dairy products. A variety of fish, especially oily fish that contain omega-3 fatty acids. Aim to eat fish at least 2 times a week. Avoid foods and drinks that have added sugar. Use healthy cooking methods, such as roasting,  grilling, broiling, baking, poaching, steaming, and stir-frying. Do not fry your food except for stir-frying.  Lifestyle  Get regular exercise. Aim to exercise for a total of 150 minutes a week. Increase your activity level by doing activities such as gardening, walking, and taking the stairs. Do not use any products that contain nicotine or tobacco, such as cigarettes, e-cigarettes, and chewing tobacco. If you need help quitting, ask your health care provider.  General instructions Take over-the-counter and prescription medicines only as told by your health care provider. Keep all follow-up visits as told by your health care provider. This is important. Where to find more information American Heart Association: www.heart.org National Heart, Lung, and Blood Institute: https://wilson-eaton.com/ Contact a health care provider if: You have trouble achieving or maintaining a healthy diet or weight. You are starting an exercise program. You are unable to stop smoking. Get help right away if: You have chest pain. You have trouble breathing. You have any symptoms of a stroke. "BE FAST" is an easy way to remember the main warning signs of a stroke: B - Balance. Signs are dizziness, sudden trouble walking, or loss of balance. E - Eyes. Signs are trouble seeing or a sudden change in vision. F - Face. Signs are sudden weakness or numbness of the face, or the face or eyelid drooping on one side. A - Arms. Signs are weakness or numbness in an arm. This happens suddenly and usually on one side of the body. S - Speech. Signs are sudden trouble speaking, slurred speech, or trouble understanding what people say. T - Time. Time to call emergency services. Write down what time symptoms started. You have other signs of a stroke, such as: A sudden, severe headache with no known cause. Nausea or vomiting. Seizure. These symptoms may represent a serious problem that is an emergency. Do not wait to see if the  symptoms will go away. Get medical help right away. Call your local emergency services (911 in the [place] Do not drive yourself to the hospital. Summary Cholesterol plaques increase your risk for heart attack and stroke. Work with your health care provider to keep your cholesterol levels in a healthy range. Eat a healthy, balanced diet, get regular exercise, and maintain a healthy weight. Do not use any products that contain nicotine or tobacco, such as cigarettes, e-cigarettes, and chewing tobacco. Get help right away if you have any symptoms of a stroke. This information is not intended to replace advice given to you by your health care provider. Make sure you discuss any questions you have with your healthcare provider. Document Revised: 01/17/2019 Document Reviewed: 01/17/2019 Elsevier Patient Education  2022 [employer].

## 2020-09-24 NOTE — Assessment & Plan Note (Addendum)
Manages his ADLs well and his decrease in memory is mild and stable.

## 2020-09-25 NOTE — Progress Notes (Signed)
Subjective: 75 year old male presents the office today for Evaluation of right foot pain.  He states that he is still getting symptoms to his right foot he is having tenderness and numbness mostly to the second third toes.  States he is doing about the same.  Also presents today to review MRI.  He is diabetic his last A1c was 6.7.  No radiating pain.  No open sores.Denies any systemic complaints such as fevers, chills, nausea, vomiting. No acute changes since last appointment, and no other complaints at this time.   Objective: AAO x3, NAD DP/PT pulses palpable bilaterally, CRT less than 3 seconds Sensation intact with Semmes Weinstein monofilament. Tenderness on the second interspace of the right foot.  Certainly from palpation gets numbness into the second and third toes.  No edema, erythema.  No other areas of pinpoint tenderness.  MMT 5/5.  No pain with calf compression, swelling, warmth, erythema  Assessment: Right foot still concern for neuroma versus impingement  Plan: -All treatment options discussed with the patient including all alternatives, risks, complications.  -I reviewed the MRI which is normal.  Today steroid injection was performed of the second interspace.  The skin was cleaned with alcohol and a mixture of 1 cc Kenalog 10, 1 cc lidocaine plain was infiltrated 1 second interspace on the area maximal tenderness.  Postinjection care discussed.  Neuroma pads dispensed.  Was sent to MRI for second opinion. -From her diabetes standpoint he has no open sores.  Although he has numbness in the second and third toes I do not think this is related to underlying neuropathy. -Patient encouraged to call the office with any questions, concerns, change in symptoms.   Trula Slade DPM

## 2020-09-26 ENCOUNTER — Telehealth: Payer: Self-pay | Admitting: *Deleted

## 2020-09-26 NOTE — Telephone Encounter (Signed)
Went by [facility] imaging this morning and got the patient's CD and sent to [place] today. Lattie Haw

## 2020-09-30 DIAGNOSIS — N400 Enlarged prostate without lower urinary tract symptoms: Secondary | ICD-10-CM | POA: Insufficient documentation

## 2020-09-30 DIAGNOSIS — M542 Cervicalgia: Secondary | ICD-10-CM | POA: Insufficient documentation

## 2020-09-30 NOTE — Assessment & Plan Note (Signed)
Tolerating statin, encouraged heart healthy diet, avoid trans fats, minimize simple carbs and saturated fats. Increase exercise as tolerated 

## 2020-09-30 NOTE — Assessment & Plan Note (Signed)
Encouraged moist heat and gentle stretching as tolerated. May try NSAIDs and prescription meds as directed and report if symptoms worsen or seek immediate care. Is following with [facility], Encouraged moist heat and gentle stretching as tolerated. May try NSAIDs and prescription meds as directed and report if symptoms worsen or seek immediate care

## 2020-09-30 NOTE — Assessment & Plan Note (Signed)
Has been seen by Dr Jacqualyn Posey of podiatry but his pain persists. He is considering seeking care in [place] for this.

## 2020-09-30 NOTE — Assessment & Plan Note (Signed)
Supplement and monitor 

## 2020-09-30 NOTE — Assessment & Plan Note (Signed)
On Levothyroxine, continue to monitor 

## 2020-09-30 NOTE — Assessment & Plan Note (Signed)
Follows with Urology, dr Roni Bread, he reports his last PSA was 4.29

## 2020-09-30 NOTE — Assessment & Plan Note (Signed)
No recent flares or concerns, no changes

## 2020-09-30 NOTE — Assessment & Plan Note (Signed)
Doing well on Duloxetine 30 mg po bid.

## 2020-09-30 NOTE — Assessment & Plan Note (Signed)
hgba1c acceptable, minimize simple carbs. Increase exercise as tolerated.  

## 2020-10-15 DIAGNOSIS — M7741 Metatarsalgia, right foot: Secondary | ICD-10-CM | POA: Diagnosis not present

## 2020-10-31 ENCOUNTER — Other Ambulatory Visit: Payer: Self-pay | Admitting: Oncology

## 2020-10-31 DIAGNOSIS — I82509 Chronic embolism and thrombosis of unspecified deep veins of unspecified lower extremity: Secondary | ICD-10-CM

## 2020-11-13 ENCOUNTER — Encounter: Payer: Self-pay | Admitting: Oncology

## 2020-11-19 DIAGNOSIS — M7741 Metatarsalgia, right foot: Secondary | ICD-10-CM | POA: Diagnosis not present

## 2020-11-19 DIAGNOSIS — G5761 Lesion of plantar nerve, right lower limb: Secondary | ICD-10-CM | POA: Diagnosis not present

## 2020-11-22 DIAGNOSIS — G250 Essential tremor: Secondary | ICD-10-CM | POA: Diagnosis not present

## 2020-11-22 DIAGNOSIS — G3184 Mild cognitive impairment, so stated: Secondary | ICD-10-CM | POA: Diagnosis not present

## 2020-11-22 DIAGNOSIS — G609 Hereditary and idiopathic neuropathy, unspecified: Secondary | ICD-10-CM | POA: Diagnosis not present

## 2020-11-29 DIAGNOSIS — D6859 Other primary thrombophilia: Secondary | ICD-10-CM | POA: Diagnosis not present

## 2020-11-29 DIAGNOSIS — Z7901 Long term (current) use of anticoagulants: Secondary | ICD-10-CM | POA: Diagnosis not present

## 2020-11-29 DIAGNOSIS — I2699 Other pulmonary embolism without acute cor pulmonale: Secondary | ICD-10-CM | POA: Diagnosis not present

## 2020-11-29 LAB — PROTIME-INR: INR: 1.8 — AB (ref 0.9–1.1)

## 2020-12-07 ENCOUNTER — Other Ambulatory Visit: Payer: Self-pay | Admitting: Family Medicine

## 2020-12-07 DIAGNOSIS — M353 Polymyalgia rheumatica: Secondary | ICD-10-CM

## 2020-12-10 DIAGNOSIS — R972 Elevated prostate specific antigen [PSA]: Secondary | ICD-10-CM | POA: Diagnosis not present

## 2020-12-13 ENCOUNTER — Encounter: Payer: Self-pay | Admitting: Family Medicine

## 2020-12-14 NOTE — Telephone Encounter (Signed)
Spoke with pt, he's scheduled  for both appointments

## 2020-12-15 ENCOUNTER — Other Ambulatory Visit: Payer: Self-pay | Admitting: Family Medicine

## 2020-12-17 DIAGNOSIS — R3915 Urgency of urination: Secondary | ICD-10-CM | POA: Diagnosis not present

## 2020-12-17 DIAGNOSIS — N500 Atrophy of testis: Secondary | ICD-10-CM | POA: Diagnosis not present

## 2020-12-17 DIAGNOSIS — R972 Elevated prostate specific antigen [PSA]: Secondary | ICD-10-CM | POA: Diagnosis not present

## 2020-12-17 DIAGNOSIS — N5201 Erectile dysfunction due to arterial insufficiency: Secondary | ICD-10-CM | POA: Diagnosis not present

## 2020-12-17 DIAGNOSIS — N403 Nodular prostate with lower urinary tract symptoms: Secondary | ICD-10-CM | POA: Diagnosis not present

## 2020-12-22 ENCOUNTER — Encounter: Payer: Self-pay | Admitting: Oncology

## 2020-12-22 LAB — POCT INR: INR: 3.8 — AB (ref 2.0–3.0)

## 2020-12-26 ENCOUNTER — Encounter: Payer: Self-pay | Admitting: *Deleted

## 2021-01-03 ENCOUNTER — Ambulatory Visit: Payer: Medicare PPO | Admitting: Family Medicine

## 2021-01-17 DIAGNOSIS — R208 Other disturbances of skin sensation: Secondary | ICD-10-CM | POA: Diagnosis not present

## 2021-01-17 DIAGNOSIS — D225 Melanocytic nevi of trunk: Secondary | ICD-10-CM | POA: Diagnosis not present

## 2021-01-17 DIAGNOSIS — L821 Other seborrheic keratosis: Secondary | ICD-10-CM | POA: Diagnosis not present

## 2021-01-17 DIAGNOSIS — L820 Inflamed seborrheic keratosis: Secondary | ICD-10-CM | POA: Diagnosis not present

## 2021-01-17 DIAGNOSIS — L814 Other melanin hyperpigmentation: Secondary | ICD-10-CM | POA: Diagnosis not present

## 2021-01-17 DIAGNOSIS — L298 Other pruritus: Secondary | ICD-10-CM | POA: Diagnosis not present

## 2021-02-03 ENCOUNTER — Encounter: Payer: Self-pay | Admitting: Oncology

## 2021-02-11 DIAGNOSIS — D485 Neoplasm of uncertain behavior of skin: Secondary | ICD-10-CM | POA: Diagnosis not present

## 2021-02-11 DIAGNOSIS — M542 Cervicalgia: Secondary | ICD-10-CM | POA: Diagnosis not present

## 2021-02-11 DIAGNOSIS — C44222 Squamous cell carcinoma of skin of right ear and external auricular canal: Secondary | ICD-10-CM | POA: Diagnosis not present

## 2021-02-22 DIAGNOSIS — Z7901 Long term (current) use of anticoagulants: Secondary | ICD-10-CM | POA: Diagnosis not present

## 2021-02-22 DIAGNOSIS — I2699 Other pulmonary embolism without acute cor pulmonale: Secondary | ICD-10-CM | POA: Diagnosis not present

## 2021-02-22 DIAGNOSIS — D6859 Other primary thrombophilia: Secondary | ICD-10-CM | POA: Diagnosis not present

## 2021-03-01 ENCOUNTER — Other Ambulatory Visit: Payer: Self-pay | Admitting: Family Medicine

## 2021-03-05 ENCOUNTER — Encounter: Payer: Self-pay | Admitting: Family Medicine

## 2021-03-05 ENCOUNTER — Other Ambulatory Visit: Payer: Self-pay | Admitting: Family Medicine

## 2021-03-05 DIAGNOSIS — E785 Hyperlipidemia, unspecified: Secondary | ICD-10-CM

## 2021-03-08 DIAGNOSIS — M542 Cervicalgia: Secondary | ICD-10-CM | POA: Diagnosis not present

## 2021-03-15 ENCOUNTER — Encounter: Payer: Self-pay | Admitting: Oncology

## 2021-03-16 ENCOUNTER — Other Ambulatory Visit: Payer: Self-pay | Admitting: Family Medicine

## 2021-03-18 ENCOUNTER — Other Ambulatory Visit: Payer: Self-pay

## 2021-03-19 ENCOUNTER — Other Ambulatory Visit (INDEPENDENT_AMBULATORY_CARE_PROVIDER_SITE_OTHER): Payer: Medicare PPO

## 2021-03-19 DIAGNOSIS — E039 Hypothyroidism, unspecified: Secondary | ICD-10-CM | POA: Diagnosis not present

## 2021-03-19 DIAGNOSIS — E559 Vitamin D deficiency, unspecified: Secondary | ICD-10-CM

## 2021-03-19 DIAGNOSIS — G609 Hereditary and idiopathic neuropathy, unspecified: Secondary | ICD-10-CM

## 2021-03-19 DIAGNOSIS — E1140 Type 2 diabetes mellitus with diabetic neuropathy, unspecified: Secondary | ICD-10-CM | POA: Diagnosis not present

## 2021-03-19 DIAGNOSIS — M542 Cervicalgia: Secondary | ICD-10-CM | POA: Diagnosis not present

## 2021-03-19 DIAGNOSIS — W19XXXA Unspecified fall, initial encounter: Secondary | ICD-10-CM | POA: Diagnosis not present

## 2021-03-19 DIAGNOSIS — E785 Hyperlipidemia, unspecified: Secondary | ICD-10-CM | POA: Diagnosis not present

## 2021-03-19 LAB — COMPREHENSIVE METABOLIC PANEL
ALT: 26 U/L (ref 0–53)
AST: 21 U/L (ref 0–37)
Albumin: 4.1 g/dL (ref 3.5–5.2)
Alkaline Phosphatase: 55 U/L (ref 39–117)
BUN: 14 mg/dL (ref 6–23)
CO2: 26 mEq/L (ref 19–32)
Calcium: 9.1 mg/dL (ref 8.4–10.5)
Chloride: 105 mEq/L (ref 96–112)
Creatinine, Ser: 1.12 mg/dL (ref 0.40–1.50)
GFR: 63.79 mL/min (ref >60.00)
Glucose, Bld: 105 mg/dL — ABNORMAL HIGH (ref 70–99)
Potassium: 4.5 mEq/L (ref 3.5–5.1)
Sodium: 140 mEq/L (ref 135–145)
Total Bilirubin: 0.6 mg/dL (ref 0.2–1.2)
Total Protein: 7.2 g/dL (ref 6.0–8.3)

## 2021-03-19 LAB — CBC
HCT: 44.2 % (ref 39.0–52.0)
Hemoglobin: 14.2 g/dL (ref 13.0–17.0)
MCHC: 32.2 g/dL (ref 30.0–36.0)
MCV: 90 fl (ref 78.0–100.0)
Platelets: 213 10*3/uL (ref 150.0–400.0)
RBC: 4.91 Mil/uL (ref 4.22–5.81)
RDW: 14.5 % (ref 11.5–15.5)
WBC: 6.8 10*3/uL (ref 4.0–10.5)

## 2021-03-19 LAB — LIPID PANEL
Cholesterol: 193 mg/dL (ref 0–200)
HDL: 35.4 mg/dL — ABNORMAL LOW (ref >39.00)
LDL Cholesterol: 123 mg/dL — ABNORMAL HIGH (ref 0–99)
NonHDL: 157.6
Total CHOL/HDL Ratio: 5
Triglycerides: 172 mg/dL — ABNORMAL HIGH (ref 0.0–149.0)
VLDL: 34.4 mg/dL (ref 0.0–40.0)

## 2021-03-19 LAB — TSH: TSH: 0.79 u[IU]/mL (ref 0.35–5.50)

## 2021-03-19 LAB — FOLATE: Folate: 18.6 ng/mL (ref >5.9)

## 2021-03-19 LAB — VITAMIN D 25 HYDROXY (VIT D DEFICIENCY, FRACTURES): VITD: 70.9 ng/mL (ref 30.00–100.00)

## 2021-03-19 LAB — HEMOGLOBIN A1C: Hgb A1c MFr Bld: 6.7 % — ABNORMAL HIGH (ref 4.6–6.5)

## 2021-03-19 LAB — VITAMIN B12: Vitamin B-12: 382 pg/mL (ref 211–911)

## 2021-03-21 DIAGNOSIS — C44222 Squamous cell carcinoma of skin of right ear and external auricular canal: Secondary | ICD-10-CM | POA: Diagnosis not present

## 2021-03-23 LAB — LIPOPROTEIN A (LPA): Lipoprotein (a): 512 nmol/L — ABNORMAL HIGH (ref <75)

## 2021-03-23 LAB — VITAMIN B1: Vitamin B1 (Thiamine): 13 nmol/L (ref 8–30)

## 2021-03-26 ENCOUNTER — Ambulatory Visit: Payer: Medicare PPO | Admitting: Family Medicine

## 2021-03-26 ENCOUNTER — Encounter: Payer: Self-pay | Admitting: Family Medicine

## 2021-03-26 VITALS — BP 102/64 | HR 66 | Temp 98.1°F | Resp 16 | Ht 72.0 in | Wt 171.4 lb

## 2021-03-26 DIAGNOSIS — R739 Hyperglycemia, unspecified: Secondary | ICD-10-CM

## 2021-03-26 DIAGNOSIS — E1165 Type 2 diabetes mellitus with hyperglycemia: Secondary | ICD-10-CM

## 2021-03-26 DIAGNOSIS — D6859 Other primary thrombophilia: Secondary | ICD-10-CM

## 2021-03-26 DIAGNOSIS — E559 Vitamin D deficiency, unspecified: Secondary | ICD-10-CM | POA: Diagnosis not present

## 2021-03-26 DIAGNOSIS — E039 Hypothyroidism, unspecified: Secondary | ICD-10-CM

## 2021-03-26 DIAGNOSIS — E785 Hyperlipidemia, unspecified: Secondary | ICD-10-CM

## 2021-03-26 MED ORDER — THYROID 90 MG PO TABS: 90.0000 mg | ORAL_TABLET | ORAL | 1 refills | Status: DC

## 2021-03-26 MED ORDER — THYROID 60 MG PO TABS: ORAL_TABLET | ORAL | 1 refills | Status: DC

## 2021-03-26 NOTE — Assessment & Plan Note (Signed)
Encourage heart healthy diet such as MIND or DASH diet, increase exercise, avoid trans fats, simple carbohydrates and processed foods, consider a krill or fish or flaxseed oil cap daily.  °

## 2021-03-26 NOTE — Assessment & Plan Note (Signed)
Supplement and monitor 

## 2021-03-26 NOTE — Assessment & Plan Note (Signed)
hgba1c acceptable, minimize simple carbs. Increase exercise as tolerated.  

## 2021-03-26 NOTE — Assessment & Plan Note (Signed)
On Levothyroxine, continue to monitor 

## 2021-03-26 NOTE — Patient Instructions (Signed)
Lipoproteins Test Why am I having this test? Lipoproteins are substances that move cholesterol and fats through the bloodstream. You may have the lipoproteins test to evaluate your risk for developing heart disease. What is being tested? This test measures lipoproteins in your blood. Lipoproteins transport cholesterol, triglycerides, and other fats to and from your tissues and your liver. There are three main types of lipoproteins: High-density lipoproteins (HDL). This is sometimes called good cholesterol. Having high levels of HDL decreases your risk for heart disease. Low-density lipoproteins (LDL). This is sometimes called bad cholesterol. Having high levels of LDL increases your risk for heart disease. Very low-density lipoproteins (VLDL). A high level of VLDL also increases your risk for heart disease. The lipoproteins test may measure one, two, or all three of these lipoproteins. When all types of lipoproteins are measured together, the test is called a lipid profile test. What kind of sample is taken? A blood sample is required for this test. It is usually collected by inserting a needle into a blood vessel or by sticking a finger with a small needle. How do I prepare for this test? Follow instructions from your health care provider about changing or stopping your regular medicines. Do not eat or drink anything except water starting 9-12 hours before your test, or as long as told by your health care provider. Do not drink alcohol starting at least 24 hours before your test. Follow any instructions from your health care provider about dietary restrictions before your test. Tell a health care provider about: Any allergies you have. All medicines you are taking, including vitamins, herbs, eye drops, creams, and over-the-counter medicines. Any bleeding problems you have. Any surgeries you have had. Any medical conditions you have. Whether you are pregnant or may be pregnant. How are the  results reported? Your test results will be reported as values that indicate how much of each lipoprotein is in your blood. This is usually given as milligrams of lipoprotein per deciliter of blood (mg/dL). Your health care provider will compare your results to normal ranges that were established after testing a large group of people (reference ranges). Reference ranges may vary among labs and hospitals. For this test, common reference ranges are: HDL: Male: greater than 45 mg/dL. Male: greater than 55 mg/dL. LDL: Adult: less than 130 mg/dL. Children: less than 110 mg/dL. VLDL: 7-32 mg/dL. The VLDL result may also be given as a percentage of total cholesterol. VLDL levels that are higher than 25-50% of total cholesterol are associated with an increased risk of heart disease. What do the results mean? Your HDL results will be used to describe your risk for heart disease as low, moderate, or high. You are at low risk for heart disease if your HDL result is: Men: greater than 60 mg/dL. Women: greater than 70 mg/dL. You are at moderate risk for heart disease if your HDL result is: Men: 45-59 mg/dL. Women: 55-69 mg/dL. You are at high risk for heart disease if your HDL result is: Men: 25-44 mg/dL. Women: 35-54 mg/dL. Having a low level of HDL along with an LDL level that is higher than 130 mg/dL also increases your risk for heart disease. Based on your risk for heart disease, your health care provider will determine a target LDL level for you. If you are at low risk, your LDL should be 130 mg/dL or less. If you are at moderate risk, your LDL should be 100 mg/dL or less. If you are at high risk, your LDL  should be less than 70 mg/dL. A VLDL level that is higher than 32 mg/dL or above 25-50% of total cholesterol increases your risk for heart disease. You may need to make lifestyle changes or take medicines to lower your LDL cholesterol and decrease your risk for heart disease. Talk with your  health care provider about your individual risk and what your results mean. Questions to ask your health care provider Ask your health care provider, or the department that is doing the test: When will my results be ready? How will I get my results? What are my treatment options? What other tests do I need? What are my next steps? Summary Lipoproteins are substances that move cholesterol and fats through your bloodstream. The lipoproteins test helps evaluate your risk for developing heart disease. There are three main types of lipoproteins: high-density lipoproteins (HDL), low-density lipoproteins (LDL), and very low-density lipoproteins (VLDL). Having a high level of HDL and low levels of LDL and VLDL decreases your risk for heart disease. Talk with your health care provider about what your results mean. This information is not intended to replace advice given to you by your health care provider. Make sure you discuss any questions you have with your health care provider. Document Revised: 05/30/2020 Document Reviewed: 05/30/2020 Elsevier Patient Education  [place].

## 2021-03-27 NOTE — Progress Notes (Signed)
Subjective:    Patient ID: Paul Ryan, male    DOB: April 05, 1945, 76 y.o.   MRN: [id number]  Chief Complaint  Patient presents with   6 months follow up    HPI Patient is in today for follow up on chronic medical concerns. No recent febrile illness or hospitalizations. No acute concerns. He continues to maintain a heart healthy diet and stays active. Denies CP/palp/SOB/HA/congestion/fevers/GI or GU c/o. 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   Hyperglycemia 12/26/2014   Hyperlipidemia    Hypothyroid 06/10/2011   Insomnia 04/24/2016   Left hip pain 12/17/2010   MCI (mild cognitive impairment) 07/22/2016   Nasal lesion 07/22/2016   Polymyalgia ([place]) 03/21/2014   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   Preventative health care 12/17/2010   Shingles 1982   right abdominal wall   Staph skin infection 07/22/2016   Thyroid nodule 11/29/2014   Urinary hesitancy 09/30/2016   Vitamin D deficiency 10/14/2011    Past Surgical History:  Procedure Laterality Date   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   72 surgeries    Family History  Problem Relation Age of Onset   Cancer Mother        unknown- everywhere   Cancer Sister        breast    Social History   Socioeconomic History   Marital status: Married    Spouse name: Not on file   Number of children: Not on file   Years of education: Not on file   Highest education level: Not on file  Occupational History   Not on file  Tobacco Use   Smoking status: Former    Packs/day: 1.00    Years: 20.00    Pack years: 20.00    Types: Cigarettes    Quit  date: 03/03/1980    Years since quitting: 41.0   Smokeless tobacco: Never  Substance and Sexual Activity   Alcohol use: No    Alcohol/week: 0.0 standard drinks   Drug use: No   Sexual activity: Not on file  Other Topics Concern   Not on file  Social History Narrative   Not on file   Social Determinants of Health   Financial Resource Strain: Low Risk    Difficulty of Paying Living Expenses: Not hard at all  Food Insecurity: No Food Insecurity   Worried About Charity fundraiser in the Last Year: Never true   Arboriculturist in the Last Year: Never true  Transportation Needs: No Transportation Needs   Lack of Transportation (Medical): No   Lack of Transportation (Non-Medical): No  Physical Activity: Sufficiently Active   Days of Exercise per Week: 7 days   Minutes of Exercise per Session: 50 min  Stress: No Stress Concern Present   Feeling of Stress : Not at all  Social Connections: Moderately Isolated   Frequency of Communication with Friends and Family: More than three times a week   Frequency of Social Gatherings with Friends and Family: More than three times a week   Attends Religious Services:  Never   Active Member of Clubs or Organizations: No   Attends Archivist Meetings: Never   Marital Status: Married  Human resources officer Violence: Not At Risk   Fear of Current or Ex-Partner: No   Emotionally Abused: No   Physically Abused: No   Sexually Abused: No    Outpatient Medications Prior to Visit  Medication Sig Dispense Refill   Ascorbic Acid (VITAMIN C ER PO) Take by mouth daily.     Cholecalciferol 4000 UNITS TABS Take 1 tablet by mouth daily.     Coenzyme Q10 (COQ10 PO) Take 2 tablets by mouth daily.     DULoxetine (CYMBALTA) 30 MG capsule Take 30 mg by mouth 2 (two) times daily.     enoxaparin (LOVENOX) 120 MG/0.8ML injection Inject 0.8 mLs (120 mg total) into the skin daily. Use only if INR < 2.0 or as instructed, discontinue when INR 2.0-3.0 30 Syringe 3    hydroquinone 4 % cream Apply topically 2 (two) times daily. 28.35 g 1   NON FORMULARY [facility] immune support     pantoprazole (PROTONIX) 40 MG tablet Take 40 mg by mouth daily.     rosuvastatin (CRESTOR) 10 MG tablet Take 1 tablet (10 mg total) by mouth daily. 90 tablet 1   warfarin (COUMADIN) 4 MG tablet TAKE 1 TABLET BY MOUTH DAILY. ADJUST DOSE AS INSTRUCTED 90 tablet 3   warfarin (COUMADIN) 5 MG tablet Take 1 tablet (5 mg total) by mouth daily. 5 tablet 0   thyroid (ARMOUR THYROID) 60 MG tablet TAKE ONE TABLET BY MOUTH ON MONDAY, WEDNESDAY, THURSDAY, FRIDAY, AND SUNDAY. 65 tablet 0   thyroid (ARMOUR THYROID) 90 MG tablet TAKE ONE TABLET BY MOUTH ON MONDAY, WEDNESDAY, THURSDAY, FRIDAY, AND SATURDAY 60 tablet 1   b complex vitamins tablet Take 1 tablet by mouth daily.     cetirizine (ZYRTEC) 10 MG tablet TAKE 1 TABLET BY MOUTH EVERY DAY AS NEEDED FOR ALLERGIES 30 tablet 4   Cranberry 250 MG TABS Take 1 tablet by mouth daily.     levofloxacin (LEVAQUIN) 750 MG tablet TAKE 1 TABLET BY MOUTH 1 HOUR PRIOR TO THE PROCEDURE     predniSONE (DELTASONE) 1 MG tablet TAKE 1 TABLET BY MOUTH EVERY DAY 90 tablet 1   No facility-administered medications prior to visit.    No Known Allergies  Review of Systems  Constitutional:  Negative for fever and malaise/fatigue.  HENT:  Negative for congestion.   Eyes:  Negative for blurred vision.  Respiratory:  Negative for shortness of breath.   Cardiovascular:  Negative for chest pain, palpitations and leg swelling.  Gastrointestinal:  Negative for abdominal pain, blood in stool and nausea.  Genitourinary:  Negative for dysuria and frequency.  Musculoskeletal:  Negative for falls.  Skin:  Negative for rash.  Neurological:  Negative for dizziness, loss of consciousness and headaches.  Endo/Heme/Allergies:  Negative for environmental allergies.  Psychiatric/Behavioral:  Negative for depression. The patient is not nervous/anxious.       Objective:    Physical  Exam Constitutional:      General: He is not in acute distress.    Appearance: Normal appearance. He is not ill-appearing or toxic-appearing.  HENT:     Head: Normocephalic and atraumatic.     Right Ear: External ear normal.     Left Ear: External ear normal.     Nose: Nose normal.  Eyes:     General:        Right eye: No  discharge.        Left eye: No discharge.     Pupils: Pupils are equal, round, and reactive to light.  Cardiovascular:     Rate and Rhythm: Normal rate.  Pulmonary:     Effort: Pulmonary effort is normal.  Abdominal:     Palpations: Abdomen is soft.     Tenderness: There is no guarding.  Skin:    Findings: No rash.  Neurological:     Mental Status: He is alert and oriented to person, place, and time.  Psychiatric:        Behavior: Behavior normal.    BP 102/64    Pulse 66    Temp 98.1 F (36.7 C)    Resp 16    Ht 6' (1.829 m)    Wt 171 lb 6.4 oz (77.7 kg)    SpO2 97%    BMI 23.25 kg/m  Wt Readings from Last 3 Encounters:  03/26/21 171 lb 6.4 oz (77.7 kg)  09/19/20 175 lb (79.4 kg)  06/20/20 175 lb (79.4 kg)    Diabetic Foot Exam - Simple   No data filed    Lab Results  Component Value Date   WBC 6.8 03/19/2021   HGB 14.2 03/19/2021   HCT 44.2 03/19/2021   PLT 213.0 03/19/2021   GLUCOSE 105 (H) 03/19/2021   CHOL 193 03/19/2021   TRIG 172.0 (H) 03/19/2021   HDL 35.40 (L) 03/19/2021   LDLDIRECT 158.0 03/11/2013   LDLCALC 123 (H) 03/19/2021   ALT 26 03/19/2021   AST 21 03/19/2021   NA 140 03/19/2021   K 4.5 03/19/2021   CL 105 03/19/2021   CREATININE 1.12 03/19/2021   BUN 14 03/19/2021   CO2 26 03/19/2021   TSH 0.79 03/19/2021   PSA 4.29 (H) 06/20/2020   INR 3.8 (A) 12/22/2020   HGBA1C 6.7 (H) 03/19/2021   MICROALBUR 0.9 09/24/2020    Lab Results  Component Value Date   TSH 0.79 03/19/2021   Lab Results  Component Value Date   WBC 6.8 03/19/2021   HGB 14.2 03/19/2021   HCT 44.2 03/19/2021   MCV 90.0 03/19/2021   PLT 213.0  03/19/2021   Lab Results  Component Value Date   NA 140 03/19/2021   K 4.5 03/19/2021   CHLORIDE 109 11/16/2012   CO2 26 03/19/2021   GLUCOSE 105 (H) 03/19/2021   BUN 14 03/19/2021   CREATININE 1.12 03/19/2021   BILITOT 0.6 03/19/2021   ALKPHOS 55 03/19/2021   AST 21 03/19/2021   ALT 26 03/19/2021   PROT 7.2 03/19/2021   ALBUMIN 4.1 03/19/2021   CALCIUM 9.1 03/19/2021   ANIONGAP 8 09/11/2014   GFR 63.79 03/19/2021   Lab Results  Component Value Date   CHOL 193 03/19/2021   Lab Results  Component Value Date   HDL 35.40 (L) 03/19/2021   Lab Results  Component Value Date   LDLCALC 123 (H) 03/19/2021   Lab Results  Component Value Date   TRIG 172.0 (H) 03/19/2021   Lab Results  Component Value Date   CHOLHDL 5 03/19/2021   Lab Results  Component Value Date   HGBA1C 6.7 (H) 03/19/2021       Assessment & Plan:   Problem List Items Addressed This Visit     Hyperlipidemia    Encourage heart healthy diet such as MIND or DASH diet, increase exercise, avoid trans fats, simple carbohydrates and processed foods, consider a krill or fish or flaxseed oil cap daily.  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 Increase exercise as tolerated.       Other Visit Diagnoses     Type 2 diabetes mellitus with hyperglycemia, without long-term current use of insulin ([place])       Relevant Orders   Ambulatory referral to Cardiology       I have discontinued Cy Aramburo's b complex vitamins, cetirizine, Cranberry,  levofloxacin, and predniSONE. I have also changed his thyroid and thyroid. Additionally, I am having him maintain his Cholecalciferol, Coenzyme Q10 (COQ10 PO), NON FORMULARY, enoxaparin, DULoxetine, Ascorbic Acid (VITAMIN C ER PO), pantoprazole, hydroquinone, warfarin, rosuvastatin, and warfarin.  Meds ordered this encounter  Medications   thyroid (ARMOUR THYROID) 60 MG tablet    Sig: 1 tablet daily except take 90 mg tab on Tuesday    Dispense:  75 tablet    Refill:  1   thyroid (ARMOUR THYROID) 90 MG tablet    Sig: Take 1 tablet (90 mg total) by mouth once a week. On Tuesday    Dispense:  10 tablet    Refill:  1     Penni Homans, MD

## 2021-03-29 ENCOUNTER — Encounter: Payer: Self-pay | Admitting: Oncology

## 2021-04-02 DIAGNOSIS — L538 Other specified erythematous conditions: Secondary | ICD-10-CM | POA: Diagnosis not present

## 2021-04-02 DIAGNOSIS — D485 Neoplasm of uncertain behavior of skin: Secondary | ICD-10-CM | POA: Diagnosis not present

## 2021-04-02 DIAGNOSIS — L821 Other seborrheic keratosis: Secondary | ICD-10-CM | POA: Diagnosis not present

## 2021-04-02 DIAGNOSIS — L814 Other melanin hyperpigmentation: Secondary | ICD-10-CM | POA: Diagnosis not present

## 2021-04-02 DIAGNOSIS — Z08 Encounter for follow-up examination after completed treatment for malignant neoplasm: Secondary | ICD-10-CM | POA: Diagnosis not present

## 2021-04-02 DIAGNOSIS — Z85828 Personal history of other malignant neoplasm of skin: Secondary | ICD-10-CM | POA: Diagnosis not present

## 2021-04-04 DIAGNOSIS — M542 Cervicalgia: Secondary | ICD-10-CM | POA: Diagnosis not present

## 2021-04-11 ENCOUNTER — Encounter: Payer: Self-pay | Admitting: Family Medicine

## 2021-04-16 ENCOUNTER — Other Ambulatory Visit: Payer: Self-pay

## 2021-04-16 ENCOUNTER — Ambulatory Visit: Payer: Medicare PPO | Admitting: Cardiology

## 2021-04-16 ENCOUNTER — Encounter: Payer: Self-pay | Admitting: Cardiology

## 2021-04-16 VITALS — BP 100/70 | HR 72 | Ht 72.0 in | Wt 163.0 lb

## 2021-04-16 DIAGNOSIS — Z8249 Family history of ischemic heart disease and other diseases of the circulatory system: Secondary | ICD-10-CM

## 2021-04-16 DIAGNOSIS — D6859 Other primary thrombophilia: Secondary | ICD-10-CM

## 2021-04-16 DIAGNOSIS — E785 Hyperlipidemia, unspecified: Secondary | ICD-10-CM | POA: Diagnosis not present

## 2021-04-16 NOTE — Assessment & Plan Note (Signed)
Followed by Dr. Benay Spice in hematology.

## 2021-04-16 NOTE — Assessment & Plan Note (Signed)
Labs reviewed as above.  Elevated LP(a) as well.  We will check a coronary calcium score given his asymptomatic nature with family history of recent CAD/MI with his brother.  This will help guide [place] with further medication management.  For now, continue with Crestor 10 mg a day.  If plaque is present, we will increase his Crestor to 20 mg a day and repeat his lipid panel.  As of now, there is no specific treatment for LP(a) other than decreasing his LDL cholesterol to goal levels.  Down the role there may be directed treatment for this entity.  This does however increased risk of myocardial infarction.

## 2021-04-16 NOTE — Patient Instructions (Signed)
Medication Instructions:  The current medical regimen is effective;  continue present plan and medications.  *If you need a refill on your cardiac medications before your next appointment, please call your pharmacy*  Testing/Procedures: Your physician has requested that you have a Coronary Calcium score which is completed by CT. Cardiac computed tomography (CT) is a painless test that uses an x-ray machine to take clear, detailed pictures of your heart. There are no instructions for this testing.  You may eat/drink and take your normal medications this day.  The cost of the testing is $99 due at the time of your appointment.  Follow-Up: At [facility], you and your health needs are our priority.  As part of our continuing mission to provide you with exceptional heart care, we have created designated Provider Care Teams.  These Care Teams include your primary Cardiologist (physician) and Advanced Practice Providers (APPs -  Physician Assistants and Nurse Practitioners) who all work together to provide you with the care you need, when you need it.  We recommend signing up for the patient portal called "MyChart".  Sign up information is provided on this After Visit Summary.  MyChart is used to connect with patients for Virtual Visits (Telemedicine).  Patients are able to view lab/test results, encounter notes, upcoming appointments, etc.  Non-urgent messages can be sent to your provider as well.   To learn more about what you can do with MyChart, go to [email or web address].    Your next appointment:   1 year(s)  The format for your next appointment:   In Person  Provider:   Candee Furbish, MD     Thank you for choosing [facility]!!

## 2021-04-16 NOTE — Progress Notes (Signed)
Cardiology Office Note:    Date:  04/16/2021   ID:  Paul Ryan, DOB 05/06/1945, MRN [id number]  PCP:  [name], MD   [facility] Providers Cardiologist:  Candee Furbish, MD     Referring MD: [name], MD    History of Present Illness:    Paul Ryan is a 76 y.o. male here for evaluation of hyperlipidemia.  He used to be on Crestor 10 mg a day but he stopped using it for a while.  Has known protein S deficiency and is followed by Dr. Benay Spice.  Prior to that he was followed by Dr. Beryle Beams.  He is on chronic warfarin.  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  Non-smoker.  His brother 6 months older than him recently had a heart attack.  2 separate stents placed in [place].  He has another brother in [place].  His father died at age 81 secondary to old age apparently.  He is quite active, tai chi 2 times a week also walks 4 times a day.  No anginal symptoms, no syncope no bleeding.  He does have some residual right lower extremity edema from prior DVT given his protein S deficiency.  He also has some left foot neuropathy.  His total cholesterol is 193 LDL 123 triglycerides 172 HDL 35.  TSH is 0.79 hemoglobin A1c 6.7 LP(a) 512  Past Medical History:  Diagnosis Date   Acid reflux 02/03/2016   Allergy    seasonal   Anxiety 12/01/2016   Chronic anticoagulation 06/14/2013   Coagulopathy ([place]) 05/16/2011   Collagen vascular disease ([place])    Cough 12/01/2016   DVT (deep venous thrombosis) ([name])    takes coumadin   Dysuria 02/27/2013   Erectile dysfunction 12/28/2016   Hereditary protein S deficiency ([place]) 05/16/2011   Hyperglycemia 12/26/2014   Hyperlipidemia    Hypothyroid 06/10/2011   Insomnia 04/24/2016   Left hip pain 12/17/2010   MCI (mild cognitive impairment) 07/22/2016   Nasal lesion 07/22/2016   Polymyalgia ([place]) 03/21/2014   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   Cholecalciferol 4000 UNITS TABS Take 1 tablet by mouth daily.   Coenzyme Q10 (COQ10 PO) Take 2 tablets by mouth daily.   DULoxetine (CYMBALTA) 30 MG capsule Take 30 mg by mouth 2 (two) times daily.   enoxaparin (LOVENOX) 120 MG/0.8ML injection Inject 0.8 mLs (120 mg total) into the skin daily. Use only if INR < 2.0 or as instructed, discontinue when INR 2.0-3.0   hydroquinone 4 % cream Apply topically 2 (two) times daily.   NON FORMULARY AHCC immune support   pantoprazole (PROTONIX) 40 MG tablet Take 40 mg by mouth daily.   rosuvastatin (CRESTOR) 10 MG tablet Take 1 tablet (10 mg total) by mouth daily.   thyroid (ARMOUR THYROID) 60 MG tablet 1 tablet daily except take 90 mg tab on Tuesday   thyroid (ARMOUR THYROID) 90 MG tablet Take 1 tablet (90 mg total) by mouth once a week. On Tuesday  warfarin (COUMADIN) 4 MG tablet TAKE 1 TABLET BY MOUTH DAILY. ADJUST DOSE AS INSTRUCTED     Allergies:   Patient has no known allergies.   Social History   Socioeconomic History   Marital status: Married    Spouse name: Not on file   Number of children: Not on file   Years of education: Not on file   Highest education level: Not on file  Occupational History   Not on file  Tobacco Use   Smoking status: Former    Packs/day: 1.00    Years: 20.00    Pack years: 20.00    Types: Cigarettes    Quit date: 03/03/1980    Years since quitting: 41.1   Smokeless tobacco: Never  Substance and Sexual Activity   Alcohol use: No    Alcohol/week: 0.0 standard drinks   Drug use: No   Sexual activity: Not on file  Other Topics Concern   Not on file   Social History Narrative   Not on file   Social Determinants of Health   Financial Resource Strain: Low Risk    Difficulty of Paying Living Expenses: Not hard at all  Food Insecurity: No Food Insecurity   Worried About Charity fundraiser in the Last Year: Never true   [place] in the Last Year: Never true  Transportation Needs: No Transportation Needs   Lack of Transportation (Medical): No   Lack of Transportation (Non-Medical): No  Physical Activity: Sufficiently Active   Days of Exercise per Week: 7 days   Minutes of Exercise per Session: 50 min  Stress: No Stress Concern Present   Feeling of Stress : Not at all  Social Connections: Moderately Isolated   Frequency of Communication with Friends and Family: More than three times a week   Frequency of Social Gatherings with Friends and Family: More than three times a week   Attends Religious Services: Never   Marine scientist or Organizations: No   Attends Archivist Meetings: Never   Marital Status: Married     Family History: The patient's family history includes Cancer in his mother and sister.  ROS:   Please see the history of present illness.     All other systems reviewed and are negative.  EKGs/Labs/Other Studies Reviewed:    The following studies were reviewed today: Office notes reviewed lab work reviewed  EKG:  EKG is  ordered today.  The ekg ordered today demonstrates this rhythm 72 no other abnormalities  Recent Labs: 03/19/2021: ALT 26; BUN 14; Creatinine, Ser 1.12; Hemoglobin 14.2; Platelets 213.0; Potassium 4.5; Sodium 140; TSH 0.79  Recent Lipid Panel    Component Value Date/Time   CHOL 193 03/19/2021 0828   CHOL 173 05/22/2011 0914   TRIG 172.0 (H) 03/19/2021 0828   TRIG 57 05/22/2011 0914   HDL 35.40 (L) 03/19/2021 0828   HDL 49 05/22/2011 0914   CHOLHDL 5 03/19/2021 0828   VLDL 34.4 03/19/2021 0828   LDLCALC 123 (H) 03/19/2021 0828   LDLCALC 113 (H) 05/22/2011 0914    LDLDIRECT 158.0 03/11/2013 0905     Risk Assessment/Calculations:              Physical Exam:    VS:  BP 100/70 (BP Location: Left Arm, Patient Position: Sitting, Cuff Size: Normal)    Pulse 72    Ht 6' (1.829 m)    Wt 163 lb (73.9 kg)    SpO2 95%  BMI 22.11 kg/m     Wt Readings from Last 3 Encounters:  04/16/21 163 lb (73.9 kg)  03/26/21 171 lb 6.4 oz (77.7 kg)  09/19/20 175 lb (79.4 kg)     GEN:  Well nourished, well developed in no acute distress HEENT: Normal NECK: No JVD; No carotid bruits LYMPHATICS: No lymphadenopathy CARDIAC: RRR, no murmurs, no rubs, gallops RESPIRATORY:  Clear to auscultation without rales, wheezing or rhonchi  ABDOMEN: Soft, non-tender, non-distended MUSCULOSKELETAL:  No edema; No deformity  SKIN: Warm and dry NEUROLOGIC:  Alert and oriented x 3 PSYCHIATRIC:  Normal affect   ASSESSMENT:    1. Hyperlipidemia, unspecified hyperlipidemia type   2. Family history of coronary artery disease   3. Hereditary protein S deficiency ([place])    PLAN:    In order of problems listed above:  Hyperlipidemia Labs reviewed as above.  Elevated LP(a) as well.  We will check a coronary calcium score given his asymptomatic nature with family history of recent CAD/MI with his brother.  This will help guide [place] with further medication management.  For now, continue with Crestor 10 mg a day.  If plaque is present, we will increase his Crestor to 20 mg a day and repeat his lipid panel.  As of now, there is no specific treatment for LP(a) other than decreasing his LDL cholesterol to goal levels.  Down the role there may be directed treatment for this entity.  This does however increased risk of myocardial infarction.  Hereditary protein S deficiency Followed by Dr. Benay Spice in hematology.         Medication Adjustments/Labs and Tests Ordered: Current medicines are reviewed at length with the patient today.  Concerns regarding medicines are outlined above.   Orders Placed This Encounter  Procedures   CT CARDIAC SCORING (SELF PAY ONLY)   EKG 12-Lead   No orders of the defined types were placed in this encounter.   Patient Instructions  Medication Instructions:  The current medical regimen is effective;  continue present plan and medications.  *If you need a refill on your cardiac medications before your next appointment, please call your pharmacy*  Testing/Procedures: Your physician has requested that you have a Coronary Calcium score which is completed by CT. Cardiac computed tomography (CT) is a painless test that uses an x-ray machine to take clear, detailed pictures of your heart. There are no instructions for this testing.  You may eat/drink and take your normal medications this day.  The cost of the testing is $99 due at the time of your appointment.  Follow-Up: At [facility], you and your health needs are our priority.  As part of our continuing mission to provide you with exceptional heart care, we have created designated Provider Care Teams.  These Care Teams include your primary Cardiologist (physician) and Advanced Practice Providers (APPs -  Physician Assistants and Nurse Practitioners) who all work together to provide you with the care you need, when you need it.  We recommend signing up for the patient portal called "MyChart".  Sign up information is provided on this After Visit Summary.  MyChart is used to connect with patients for Virtual Visits (Telemedicine).  Patients are able to view lab/test results, encounter notes, upcoming appointments, etc.  Non-urgent messages can be sent to your provider as well.   To learn more about what you can do with MyChart, go to [email or web address].    Your next appointment:   1 year(s)  The format for your next  appointment:   In Person  Provider:   Candee Furbish, MD     Thank you for choosing [facility]!!      Signed, Candee Furbish, MD  04/16/2021 4:49 PM    [place]

## 2021-04-17 ENCOUNTER — Encounter: Payer: Self-pay | Admitting: Family Medicine

## 2021-04-18 DIAGNOSIS — M542 Cervicalgia: Secondary | ICD-10-CM | POA: Diagnosis not present

## 2021-04-19 ENCOUNTER — Encounter: Payer: Self-pay | Admitting: Oncology

## 2021-04-19 DIAGNOSIS — H524 Presbyopia: Secondary | ICD-10-CM | POA: Diagnosis not present

## 2021-04-19 LAB — POCT INR: INR: 2.4 (ref 2.0–3.0)

## 2021-04-30 ENCOUNTER — Other Ambulatory Visit: Payer: Self-pay | Admitting: Family Medicine

## 2021-04-30 ENCOUNTER — Ambulatory Visit: Payer: Medicare PPO | Admitting: Medical

## 2021-04-30 VITALS — BP 104/90 | HR 65 | Resp 18 | Ht 73.0 in | Wt 165.0 lb

## 2021-04-30 DIAGNOSIS — L089 Local infection of the skin and subcutaneous tissue, unspecified: Secondary | ICD-10-CM

## 2021-04-30 DIAGNOSIS — K5900 Constipation, unspecified: Secondary | ICD-10-CM | POA: Diagnosis not present

## 2021-04-30 DIAGNOSIS — K209 Esophagitis, unspecified without bleeding: Secondary | ICD-10-CM | POA: Diagnosis not present

## 2021-04-30 DIAGNOSIS — Z1211 Encounter for screening for malignant neoplasm of colon: Secondary | ICD-10-CM | POA: Diagnosis not present

## 2021-04-30 DIAGNOSIS — M542 Cervicalgia: Secondary | ICD-10-CM | POA: Diagnosis not present

## 2021-04-30 MED ORDER — CEPHALEXIN 500 MG PO CAPS: 500.0000 mg | ORAL_CAPSULE | Freq: Two times a day (BID) | ORAL | 0 refills | Status: DC

## 2021-04-30 MED ORDER — TOBRAMYCIN 0.3 % OP SOLN: 2.0000 [drp] | Freq: Four times a day (QID) | OPHTHALMIC | 0 refills | Status: DC

## 2021-04-30 NOTE — Patient Instructions (Addendum)
Left upper and lower eyelid pinkish redness with swelling.  Upper lid looks more swollen than lower lid.  Concern for early periorbital cellulitis.  Prescribing Keflex antibiotic and Tobrex eyedrops.  Before starting oral antibiotic recommend checking INR and then repeat INR in 10 days or sooner if you start to notice bruising.  Follow-up this Friday or sooner if needed.

## 2021-04-30 NOTE — Progress Notes (Signed)
° °  Subjective:    Patient ID: Paul Ryan, male    DOB: 01/16/1946, 76 y.o.   MRN: [id number]  HPI Pt woke up 4 days ago with soreness to upper and low lid. States no trauma or injury. Upper lid swollen first. Then lower lid became swollend. Since then upper and lower lids now red.  No vision changes. Upper lid pain worse than bottom. No orbit pain. No dc.    Pt is on coumadin. Pt range should be 2.5-3.0. He is on coumadin 4 mg daily. He checks at home every 3 weeks. Pt sees hematologist once a year.     Review of Systems See hpi    Objective:   Physical Exam  General- No acute distress. Pleasant patient. Neck- Full range of motion, no jvd Lungs- Clear, even and unlabored. Heart- regular rate and rhythm. Neurologic- CNII- XII grossly intact.  Eyes- perrl bilaterally. Upper eye lid left side moderate bright redness. Lower lid less swollen and red. Both upper and lower lid mild tender.         Assessment & Plan:   Patient Instructions  Left upper and lower eyelid pinkish redness with swelling.  Upper lid looks more swollen than lower lid.  Concern for early periorbital cellulitis.  Prescribing Keflex antibiotic and Tobrex eyedrops.  Before starting oral antibiotic recommend checking INR and then repeat INR in 10 days or sooner if you start to notice bruising.  Follow-up this Friday or sooner if needed.

## 2021-05-02 ENCOUNTER — Encounter: Payer: Self-pay | Admitting: Oncology

## 2021-05-03 ENCOUNTER — Ambulatory Visit: Payer: Medicare PPO | Admitting: Medical

## 2021-05-03 VITALS — BP 112/60 | HR 67 | Resp 18 | Ht 73.0 in | Wt 167.8 lb

## 2021-05-03 DIAGNOSIS — L089 Local infection of the skin and subcutaneous tissue, unspecified: Secondary | ICD-10-CM | POA: Diagnosis not present

## 2021-05-03 DIAGNOSIS — H0014 Chalazion left upper eyelid: Secondary | ICD-10-CM

## 2021-05-03 NOTE — Patient Instructions (Addendum)
Skin infection of both upper and lower eye lid much improved. Lower lid looks resolved now. Upper lid looks like faint infection presently. ? ?On exam appears now evident chalazion left upper lid. ? ?Continue with both oral and drop antibiotics as well as warm compresses.  ? ?Will go ahead and place referral to ophthalmologist. If by end of antibiotic lump resolves let me know and can cancel referral. ? ?Follow up with pcp as regularly scheduled or sooner if needed. ?

## 2021-05-03 NOTE — Progress Notes (Signed)
? ?Subjective:  ? ? Patient ID: Paul Ryan, male    DOB: 05/27/1945, 76 y.o.   MRN: [id number] ? ?HPI ?Pt in for follow up. See last note. Both upper and lower lids less swollen, red and less tender.  ? ?Pt is on keflex and tobrex.  ? ?He notes after upper lid swelling came down now sees a lump in mid eye lid.  ? ?No eye dc. No vision problems. ? ?Review of Systems  ?Constitutional:  Negative for chills, fatigue and fever.  ?Eyes:  Negative for pain, redness, itching and visual disturbance.  ?Respiratory:  Negative for cough, chest tightness, shortness of breath and wheezing.   ?Cardiovascular:  Negative for chest pain and palpitations.  ?Gastrointestinal:  Negative for abdominal pain.  ?Musculoskeletal:  Negative for back pain and joint swelling.  ? ?Past Medical History:  ?Diagnosis Date  ? Acid reflux 02/03/2016  ? Allergy   ? seasonal  ? Anxiety 12/01/2016  ? Chronic anticoagulation 06/14/2013  ? Coagulopathy ([place]) 05/16/2011  ? Collagen vascular disease ([place])   ? Cough 12/01/2016  ? DVT (deep venous thrombosis) ([place])   ? takes coumadin  ? Dysuria 02/27/2013  ? Erectile dysfunction 12/28/2016  ? Hereditary protein S deficiency ([place]) 05/16/2011  ? Hyperglycemia 12/26/2014  ? Hyperlipidemia   ? Hypothyroid 06/10/2011  ? Insomnia 04/24/2016  ? Left hip pain 12/17/2010  ? MCI (mild cognitive impairment) 07/22/2016  ? Nasal lesion 07/22/2016  ? Polymyalgia ([place]) 03/21/2014  ? 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 Preventative health care 12/17/2010  ? Shingles 1982  ? right abdominal wall  ? Staph skin infection 07/22/2016  ? Thyroid nodule 11/29/2014  ? Urinary hesitancy 09/30/2016  ? Vitamin D deficiency 10/14/2011  ? ?  ?Social History  ? ?Socioeconomic History  ? Marital status: Married  ?  Spouse name: Not on file  ? Number of children: Not on file  ? Years of education: Not on file  ? Highest education level: Not on file  ?Occupational History  ? Not on file  ?Tobacco Use  ? Smoking status: Former  ?  Packs/day: 1.00  ?   Years: 20.00  ?  Pack years: 20.00  ?  Types: Cigarettes  ?  Quit date: 03/03/1980  ?  Years since quitting: 41.1  ? Smokeless tobacco: Never  ?Substance and Sexual Activity  ? Alcohol use: No  ?  Alcohol/week: 0.0 standard drinks  ? Drug use: No  ? Sexual activity: Not on file  ?Other Topics Concern  ? Not on file  ?Social History Narrative  ? Not on file  ? ?Social Determinants of Health  ? ?Financial Resource Strain: Low Risk   ? Difficulty of Paying Living Expenses: Not hard at all  ?Food Insecurity: No Food Insecurity  ? Worried About Charity fundraiser in the Last Year: Never true  ? Ran Out of Food in the Last Year: Never true  ?Transportation Needs: No Transportation Needs  ? Lack of Transportation (Medical): No  ? Lack of Transportation (Non-Medical): No  ?Physical Activity: Sufficiently Active  ? Days of Exercise per Week: 7 days  ? Minutes of Exercise per Session: 50 min  ?Stress: No Stress Concern Present  ? Feeling of Stress : Not at all  ?Social Connections: Moderately Isolated  ? Frequency of Communication with Friends and Family: More than three times a week  ? Frequency of Social Gatherings with Friends and Family: More than three times a week  ?  Attends Religious Services: Never  ? Active Member of Clubs or Organizations: No  ? Attends Archivist Meetings: Never  ? Marital Status: Married  ?Intimate Partner Violence: Not At Risk  ? Fear of Current or Ex-Partner: No  ? Emotionally Abused: No  ? Physically Abused: No  ? Sexually Abused: No  ? ? ?Past Surgical History:  ?Procedure Laterality Date  ? 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 4 surgeries  ? ? ?Family History  ?Problem Relation Age of Onset  ? Cancer Mother   ?     unknown- everywhere  ? Cancer Sister   ?     breast  ? ? ?No Known Allergies ? ?Current Outpatient Medications on File Prior to Visit  ?Medication Sig Dispense Refill  ? Ascorbic Acid (VITAMIN C ER PO) Take by mouth daily.    ? cephALEXin (KEFLEX) 500 MG capsule  Take 1 capsule (500 mg total) by mouth 2 (two) times daily. 20 capsule 0  ? Cholecalciferol 4000 UNITS TABS Take 1 tablet by mouth daily.    ? Coenzyme Q10 (COQ10 PO) Take 2 tablets by mouth daily.    ? DULoxetine (CYMBALTA) 30 MG capsule Take 30 mg by mouth 2 (two) times daily.    ? enoxaparin (LOVENOX) 120 MG/0.8ML injection Inject 0.8 mLs (120 mg total) into the skin daily. Use only if INR < 2.0 or as instructed, discontinue when INR 2.0-3.0 30 Syringe 3  ? hydroquinone 4 % cream Apply topically 2 (two) times daily. 28.35 g 1  ? NON FORMULARY AHCC immune support    ? pantoprazole (PROTONIX) 40 MG tablet Take 40 mg by mouth daily.    ? rosuvastatin (CRESTOR) 10 MG tablet TAKE 1 TABLET BY MOUTH EVERY DAY 90 tablet 1  ? thyroid (ARMOUR THYROID) 60 MG tablet TAKE ONE TABLET BY MOUTH ON MONDAY, WEDNESDAY, THURSDAY, FRIDAY, AND SUNDAY. 65 tablet 1  ? thyroid (ARMOUR THYROID) 90 MG tablet Take 1 tablet (90 mg total) by mouth once a week. On Tuesday 10 tablet 1  ? tobramycin (TOBREX) 0.3 % ophthalmic solution Place 2 drops into the left eye every 6 (six) hours. 5 mL 0  ? warfarin (COUMADIN) 4 MG tablet TAKE 1 TABLET BY MOUTH DAILY. ADJUST DOSE AS INSTRUCTED 90 tablet 3  ? ?No current facility-administered medications on file prior to visit.  ? ? ?BP 112/60   Pulse 67   Resp 18   Ht 6\' 1"  (1.854 m)   Wt 167 lb 12.8 oz (76.1 kg)   SpO2 97%   BMI 22.14 kg/m?  ?  ? ?   ?Objective:  ? Physical Exam ? ?General- No acute distress. Pleasant patient. ?Neck- Full range of motion, no jvd ?Lungs- Clear, even and unlabored. ?Heart- regular rate and rhythm. ?Neurologic- CNII- XII grossly intact.  ?Eyes- perrl bilaterally. Upper eye lid left side much improved now. But on palpation mid lid can now feel 4 mm circular lump ? ?Lower lid- normal. Not swollen or red. ? ? ?   ?Assessment & Plan:  ? ?Patient Instructions  ?Skin infection of both upper and lower eye lid much improved. Lower lid looks resolved now. Upper lid looks like  faint infection presently. ? ?On exam appears now evident chalazion left upper lid. ? ?Continue with both oral and drop antibiotics as well as warm compresses.  ? ?Will go ahead and place referral to ophthalmologist. If by end of antibiotic lump resolves let me know and  can cancel referral. ? ?Follow up with pcp as regularly scheduled or sooner if needed.  ? ?Mackie Pai, PA-C  ?

## 2021-05-14 DIAGNOSIS — Z961 Presence of intraocular lens: Secondary | ICD-10-CM | POA: Diagnosis not present

## 2021-05-14 DIAGNOSIS — H0014 Chalazion left upper eyelid: Secondary | ICD-10-CM | POA: Diagnosis not present

## 2021-05-17 DIAGNOSIS — M542 Cervicalgia: Secondary | ICD-10-CM | POA: Diagnosis not present

## 2021-05-24 LAB — POCT INR: INR: 3.2 — AB (ref 2.0–3.0)

## 2021-05-29 ENCOUNTER — Encounter: Payer: Self-pay | Admitting: *Deleted

## 2021-05-29 NOTE — Progress Notes (Signed)
Faxed clearance note re: warfarin to Dr. Rachell Cipro at [facility] for colonoscopy on 07/26/21. ?Stop warfarin 3 days prior to procedure ?Resume warfarin day of procedure at same dose. ?Faxed to [phone]. ?Copy of note to be scanned. Patient notified via [place]. ?

## 2021-06-04 ENCOUNTER — Ambulatory Visit (INDEPENDENT_AMBULATORY_CARE_PROVIDER_SITE_OTHER)
Admission: RE | Admit: 2021-06-04 | Discharge: 2021-06-04 | Disposition: A | Discharge status: Home / Self Care | Payer: Self-pay | Source: Ambulatory Visit | Attending: Cardiology | Admitting: Cardiology

## 2021-06-04 ENCOUNTER — Encounter: Payer: Self-pay | Admitting: Cardiology

## 2021-06-04 ENCOUNTER — Telehealth: Payer: Self-pay | Admitting: Cardiology

## 2021-06-04 DIAGNOSIS — Z8249 Family history of ischemic heart disease and other diseases of the circulatory system: Secondary | ICD-10-CM

## 2021-06-04 DIAGNOSIS — E785 Hyperlipidemia, unspecified: Secondary | ICD-10-CM

## 2021-06-04 DIAGNOSIS — Z79899 Other long term (current) drug therapy: Secondary | ICD-10-CM

## 2021-06-04 DIAGNOSIS — R911 Solitary pulmonary nodule: Secondary | ICD-10-CM

## 2021-06-04 NOTE — Telephone Encounter (Signed)
Call received from Simla with [facility] [place] radiology for abnormal calcium CT: ? ?IMPRESSION: ?A 6 mm right ground-glass pulmonary nodule within the upper lobe. ?Recommend a non-contrast Chest CT at 6-12 months to confirm ?persistence, then additional non-contrast Chest CTs every 2 years ?until 5 years. If nodule grows or develops solid component(s), ?consider resection. ?

## 2021-06-04 NOTE — Telephone Encounter (Signed)
Call report  

## 2021-06-05 MED ORDER — ROSUVASTATIN CALCIUM 20 MG PO TABS: 20.0000 mg | ORAL_TABLET | Freq: Every day | ORAL | 3 refills | Status: DC

## 2021-06-05 NOTE — Telephone Encounter (Signed)
Called and reviewed information with pt who states understanding.  RX for Crestor 20 mg sent into [facility] as requested.  Lab ordered and scheduled for 08/08/2021.  Order placed for referral to Eric Form, NP to f/u lung nodule.  Pt will call back if any further questions/concerns. ? ? ?

## 2021-06-05 NOTE — Telephone Encounter (Signed)
Coronary calcium score is 613 which is 67th percentile.  Currently taking Crestor 10 mg.  Lets go ahead and increase to Crestor 20 mg for further protection.  Repeat lipid panel in 2 months with ALT.  ? ?Also, lung nodule seen on CT as well.  Please refer to Eric Form, nurse practitioner at pulmonary for further follow-up.  ? ?Candee Furbish, MD  ? ?Left message for pt to c/b regarding the above results and orders.  ?

## 2021-06-05 NOTE — Telephone Encounter (Signed)
Pt returning a phone call... please advise  ?

## 2021-06-11 DIAGNOSIS — M542 Cervicalgia: Secondary | ICD-10-CM | POA: Diagnosis not present

## 2021-06-18 ENCOUNTER — Other Ambulatory Visit: Payer: Self-pay

## 2021-06-18 ENCOUNTER — Inpatient Hospital Stay: Payer: Medicare PPO | Attending: Oncology | Admitting: Oncology

## 2021-06-18 VITALS — BP 105/74 | HR 81 | Temp 98.2°F | Resp 18 | Ht 73.0 in | Wt 168.5 lb

## 2021-06-18 DIAGNOSIS — Z86711 Personal history of pulmonary embolism: Secondary | ICD-10-CM | POA: Insufficient documentation

## 2021-06-18 DIAGNOSIS — D6859 Other primary thrombophilia: Secondary | ICD-10-CM | POA: Diagnosis not present

## 2021-06-18 DIAGNOSIS — E039 Hypothyroidism, unspecified: Secondary | ICD-10-CM | POA: Diagnosis not present

## 2021-06-18 DIAGNOSIS — Z7901 Long term (current) use of anticoagulants: Secondary | ICD-10-CM | POA: Diagnosis not present

## 2021-06-18 DIAGNOSIS — Z86718 Personal history of other venous thrombosis and embolism: Secondary | ICD-10-CM | POA: Insufficient documentation

## 2021-06-18 DIAGNOSIS — I82509 Chronic embolism and thrombosis of unspecified deep veins of unspecified lower extremity: Secondary | ICD-10-CM | POA: Diagnosis not present

## 2021-06-18 MED ORDER — ENOXAPARIN SODIUM 120 MG/0.8ML IJ SOSY: 120.0000 mg | PREFILLED_SYRINGE | Freq: Every day | INTRAMUSCULAR | 0 refills | Status: DC

## 2021-06-18 NOTE — Progress Notes (Signed)
?  [place] ?OFFICE PROGRESS NOTE ? ? ?Diagnosis: Hypercoagulation syndrome ? ?INTERVAL HISTORY:  ? ?[name] returns as scheduled.  He continues Coumadin anticoagulation.  No bleeding or symptom of thrombosis.  The INR was 2.7 with the most recent home monitor.  He is scheduled for colonoscopy 07/26/2021.  He has used bridging Lovenox anticoagulation surrounding procedures in the past. ?He reports constipation for the past month.  No associated symptoms. ?Objective: ? ?Vital signs in last 24 hours: ? ?Blood pressure 105/74, pulse 81, temperature 98.2 ?F (36.8 ?C), temperature source Oral, resp. rate 18, height '6\' 1"'$  (1.854 m), weight 168 lb 8 oz (76.4 kg), SpO2 98 %. ?  ? ?Resp: Lungs clear bilaterally ?Cardio: Regular rate and rhythm ?GI: No hepatosplenomegaly, nontender, no mass ?Vascular: No leg edema, the right lower leg is slightly larger than the left side with bilateral lower extremity varicosities ? ? ?Lab Results: ? ?Lab Results  ?Component Value Date  ? WBC 6.8 03/19/2021  ? HGB 14.2 03/19/2021  ? HCT 44.2 03/19/2021  ? MCV 90.0 03/19/2021  ? PLT 213.0 03/19/2021  ? NEUTROABS 4.5 09/18/2020  ? ? ?CMP  ?Lab Results  ?Component Value Date  ? NA 140 03/19/2021  ? K 4.5 03/19/2021  ? CL 105 03/19/2021  ? CO2 26 03/19/2021  ? GLUCOSE 105 (H) 03/19/2021  ? BUN 14 03/19/2021  ? CREATININE 1.12 03/19/2021  ? CALCIUM 9.1 03/19/2021  ? PROT 7.2 03/19/2021  ? ALBUMIN 4.1 03/19/2021  ? AST 21 03/19/2021  ? ALT 26 03/19/2021  ? ALKPHOS 55 03/19/2021  ? BILITOT 0.6 03/19/2021  ? GFRNONAA 50 (L) 09/11/2014  ? GFRAA 58 (L) 09/11/2014  ? ? ?Medications: I have reviewed the patient's current medications. ? ? ?Assessment/Plan: ?Protein S deficiency ?Right lower extremity deep vein thrombosis, unprovoked, June 2010 treated with 1 year of Coumadin anticoagulation ?Placed back on Coumadin after protein S level returned low while off of Coumadin ?Anticoagulation changed to Xarelto April 2015 ?Pulmonary embolism  08/07/2014- anticoagulation changed back to Coumadin ? ?2.  History of latent tuberculosis treated with [name] in 2017 ?3.  Hypothyroidism ?4.  History of collagen vascular disease-unspecified ?5.  Right abdominal wall zoster 1982 ? ? ? ? ?Disposition: ?[name] appears stable.  He will continue Coumadin anticoagulation.  He performs home PT/INR monitoring.  He is scheduled for colonoscopy next month.  He will take Lovenox anticoagulation surrounding the colonoscopy procedure. ? ?[name] will return for an office visit in 1 year.  He will follow-up with gastroenterology for the constipation. ? ?Betsy Coder, MD ? ?06/18/2021  ?11:00 AM ? ? ?

## 2021-06-19 ENCOUNTER — Telehealth: Payer: Self-pay

## 2021-06-19 NOTE — Telephone Encounter (Signed)
Directions were given to Pt per Dr Benay Spice to hold coumadin before colonoscopy on 5/26. Instructions given -hold coumadin on 5/23,5/24,5/25. Resume coumadin 5/26 ?If no biopsy- Lovenox daily Until INR is >1.5  ?

## 2021-07-01 ENCOUNTER — Ambulatory Visit: Payer: Medicare PPO | Admitting: Pulmonary Disease

## 2021-07-01 ENCOUNTER — Encounter: Payer: Self-pay | Admitting: Pulmonary Disease

## 2021-07-01 VITALS — BP 110/68 | HR 65 | Temp 98.0°F | Ht 72.0 in | Wt 171.0 lb

## 2021-07-01 DIAGNOSIS — R911 Solitary pulmonary nodule: Secondary | ICD-10-CM | POA: Diagnosis not present

## 2021-07-01 DIAGNOSIS — R918 Other nonspecific abnormal finding of lung field: Secondary | ICD-10-CM | POA: Diagnosis not present

## 2021-07-01 NOTE — Progress Notes (Signed)
? ?Synopsis: Referred in May 2023 for lung nodule by Jerline Pain, MD ? ?Subjective:  ? ?PATIENT ID: Unknown Paul Ryan: male DOB: [birth year], MRN: [id number] ? ?Chief Complaint  ?Patient presents with  ? Consult  ?  Patient is here to talk about lung nodule.  ? ? ?This is a 76 year old gentleman, past medical history of DVT, protein S deficiency, hyperlipidemia, former smoker quit 40+ years ago.  He works as a Development worker, community.  He has been a long standing advocate for science and art.  And spends significant amount of time lecturing and traveling.  He had a cardiac scoring CT complete which revealed a small groundglass opacity in the right lung that was 6 mm in size.  Here today for evaluation of this nodule and appropriate neck steps.  We also reviewed his CT imaging today in the office. ? ? ? ?Past Medical History:  ?Diagnosis Date  ? Acid reflux 02/03/2016  ? Allergy   ? seasonal  ? Anxiety 12/01/2016  ? Chronic anticoagulation 06/14/2013  ? Coagulopathy ([place]) 05/16/2011  ? Collagen vascular disease ([place])   ? Cough 12/01/2016  ? DVT (deep venous thrombosis) ([place])   ? takes coumadin  ? Dysuria 02/27/2013  ? Erectile dysfunction 12/28/2016  ? 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 Hyperglycemia 12/26/2014  ? Hyperlipidemia   ? Hypothyroid 06/10/2011  ? Insomnia 04/24/2016  ? Left hip pain 12/17/2010  ? MCI (mild cognitive impairment) 07/22/2016  ? Nasal lesion 07/22/2016  ? Polymyalgia ([place]) 03/21/2014  ? 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 Preventative health care 12/17/2010  ? Shingles 1982  ? right abdominal wall  ? Staph skin infection 07/22/2016  ? Thyroid nodule 11/29/2014  ? Urinary hesitancy 09/30/2016  ? Vitamin D deficiency 10/14/2011  ?  ? ?Family History  ?Problem Relation Age of Onset  ? Cancer Mother   ?     unknown- everywhere  ? Cancer Sister   ?     breast  ?  ? ?Past Surgical History:  ?Procedure Laterality Date  ? 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 4 surgeries  ? ? ?Social History   ? ?Socioeconomic History  ? Marital status: Married  ?  Spouse name: Not on file  ? Number of children: Not on file  ? Years of education: Not on file  ? Highest education level: Not on file  ?Occupational History  ? Not on file  ?Tobacco Use  ? Smoking status: Former  ?  Packs/day: 1.00  ?  Years: 20.00  ?  Pack years: 20.00  ?  Types: Cigarettes  ?  Quit date: 03/03/1980  ?  Years since quitting: 41.3  ? Smokeless tobacco: Never  ?Substance and Sexual Activity  ? Alcohol use: No  ?  Alcohol/week: 0.0 standard drinks  ? Drug use: No  ? Sexual activity: Not on file  ?Other Topics Concern  ? Not on file  ?Social History Narrative  ? Not on file  ? ?Social Determinants of Health  ? ?Financial Resource Strain: Low Risk   ? Difficulty of Paying Living Expenses: Not hard at all  ?Food Insecurity: No Food Insecurity  ? Worried About Charity fundraiser in the Last Year: Never true  ? Ran Out of Food in the Last Year: Never true  ?Transportation Needs: No Transportation Needs  ? Lack of Transportation (Medical): No  ? Lack of Transportation (Non-Medical): No  ?Physical Activity: Sufficiently Active  ?  Days of Exercise per Week: 7 days  ? Minutes of Exercise per Session: 50 min  ?Stress: No Stress Concern Present  ? Feeling of Stress : Not at all  ?Social Connections: Moderately Isolated  ? Frequency of Communication with Friends and Family: More than three times a week  ? Frequency of Social Gatherings with Friends and Family: More than three times a week  ? Attends Religious Services: Never  ? Active Member of Clubs or Organizations: No  ? Attends Archivist Meetings: Never  ? Marital Status: Married  ?Intimate Partner Violence: Not At Risk  ? Fear of Current or Ex-Partner: No  ? Emotionally Abused: No  ? Physically Abused: No  ? Sexually Abused: No  ?  ? ?No Known Allergies  ? ?Outpatient Medications Prior to Visit  ?Medication Sig Dispense Refill  ? Ascorbic Acid (VITAMIN C ER PO) Take by mouth daily.    ?  Cholecalciferol 4000 UNITS TABS Take 1 tablet by mouth daily.    ? Coenzyme Q10 (COQ10 PO) Take 2 tablets by mouth daily.    ? DULoxetine (CYMBALTA) 30 MG capsule Take 30 mg by mouth 2 (two) times daily.    ? enoxaparin (LOVENOX) 120 MG/0.8ML injection Inject 0.8 mLs (120 mg total) into the skin daily. Use only if INR < 2.0 or as instructed, discontinue when INR 2.0-3.0 30 Syringe 3  ? enoxaparin (LOVENOX) 120 MG/0.8ML injection Inject 0.8 mLs (120 mg total) into the skin daily. Discontinue when INR >1.5 4.8 mL 0  ? hydroquinone 4 % cream Apply topically 2 (two) times daily. 28.35 g 1  ? NON FORMULARY AHCC immune support    ? pantoprazole (PROTONIX) 40 MG tablet Take 40 mg by mouth daily.    ? rosuvastatin (CRESTOR) 20 MG tablet Take 1 tablet (20 mg total) by mouth daily. 90 tablet 3  ? thyroid (ARMOUR THYROID) 60 MG tablet TAKE ONE TABLET BY MOUTH ON MONDAY, WEDNESDAY, THURSDAY, FRIDAY, AND SUNDAY. 65 tablet 1  ? thyroid (ARMOUR THYROID) 90 MG tablet Take 1 tablet (90 mg total) by mouth once a week. On Tuesday 10 tablet 1  ? tobramycin (TOBREX) 0.3 % ophthalmic solution Place 2 drops into the left eye every 6 (six) hours. 5 mL 0  ? warfarin (COUMADIN) 4 MG tablet TAKE 1 TABLET BY MOUTH DAILY. ADJUST DOSE AS INSTRUCTED 90 tablet 3  ? ?No facility-administered medications prior to visit.  ? ? ?Review of Systems  ?Constitutional:  Negative for chills, fever, malaise/fatigue and weight loss.  ?HENT:  Negative for hearing loss, sore throat and tinnitus.   ?Eyes:  Negative for blurred vision and double vision.  ?Respiratory:  Negative for cough, hemoptysis, sputum production, shortness of breath, wheezing and stridor.   ?Cardiovascular:  Negative for chest pain, palpitations, orthopnea, leg swelling and PND.  ?Gastrointestinal:  Negative for abdominal pain, constipation, diarrhea, heartburn, nausea and vomiting.  ?Genitourinary:  Negative for dysuria, hematuria and urgency.  ?Musculoskeletal:  Negative for joint pain and  myalgias.  ?Skin:  Negative for itching and rash.  ?Neurological:  Negative for dizziness, tingling, weakness and headaches.  ?Endo/Heme/Allergies:  Negative for environmental allergies. Does not bruise/bleed easily.  ?Psychiatric/Behavioral:  Negative for depression. The patient is not nervous/anxious and does not have insomnia.   ?All other systems reviewed and are negative. ? ? ?Objective:  ?Physical Exam ?Vitals reviewed.  ?Constitutional:   ?   General: He is not in acute distress. ?   Appearance: He is well-developed.  ?  HENT:  ?   Head: Normocephalic and atraumatic.  ?Eyes:  ?   General: No scleral icterus. ?   Conjunctiva/sclera: Conjunctivae normal.  ?   Pupils: Pupils are equal, round, and reactive to light.  ?Neck:  ?   Vascular: No JVD.  ?   Trachea: No tracheal deviation.  ?Cardiovascular:  ?   Rate and Rhythm: Normal rate and regular rhythm.  ?   Heart sounds: Normal heart sounds. No murmur heard. ?Pulmonary:  ?   Effort: Pulmonary effort is normal. No tachypnea, accessory muscle usage or respiratory distress.  ?   Breath sounds: Normal breath sounds. No stridor. No wheezing, rhonchi or rales.  ?Abdominal:  ?   General: Bowel sounds are normal. There is no distension.  ?   Palpations: Abdomen is soft.  ?   Tenderness: There is no abdominal tenderness.  ?Musculoskeletal:     ?   General: No tenderness.  ?   Cervical back: Neck supple.  ?Lymphadenopathy:  ?   Cervical: No cervical adenopathy.  ?Skin: ?   General: Skin is warm and dry.  ?   Capillary Refill: Capillary refill takes less than 2 seconds.  ?   Findings: No rash.  ?Neurological:  ?   Mental Status: He is alert and oriented to person, place, and time.  ?Psychiatric:     ?   Behavior: Behavior normal.  ? ? ? ?Vitals:  ? 07/01/21 1026  ?BP: 110/68  ?Pulse: 65  ?Temp: 98 ?F (36.7 ?C)  ?TempSrc: Oral  ?SpO2: 98%  ?Weight: 171 lb (77.6 kg)  ?Height: 6' (1.829 m)  ? ?98% on RA ?BMI Readings from Last 3 Encounters:  ?07/01/21 23.19 kg/m?  ?06/18/21  22.23 kg/m?  ?05/03/21 22.14 kg/m?  ? ?Wt Readings from Last 3 Encounters:  ?07/01/21 171 lb (77.6 kg)  ?06/18/21 168 lb 8 oz (76.4 kg)  ?05/03/21 167 lb 12.8 oz (76.1 kg)  ? ? ? ?CBC ?   ?Component Value Date/Ti

## 2021-07-01 NOTE — Patient Instructions (Signed)
Thank you for visiting Dr. Valeta Harms at [facility]. ?Today we recommend the following: ? ?Orders Placed This Encounter  ?Procedures  ? CT Super D Chest Wo Contrast  ? ?Please see [place] after the ct chest for follow up.  ? ?Return in about 1 year (around 07/02/2022) for with APP or Dr. Valeta Harms. ? ? ? ?Please do your part to reduce the spread of COVID-19.  ? ?

## 2021-07-02 DIAGNOSIS — M542 Cervicalgia: Secondary | ICD-10-CM | POA: Diagnosis not present

## 2021-07-09 DIAGNOSIS — N5201 Erectile dysfunction due to arterial insufficiency: Secondary | ICD-10-CM | POA: Diagnosis not present

## 2021-07-09 DIAGNOSIS — R3915 Urgency of urination: Secondary | ICD-10-CM | POA: Diagnosis not present

## 2021-07-09 DIAGNOSIS — N403 Nodular prostate with lower urinary tract symptoms: Secondary | ICD-10-CM | POA: Diagnosis not present

## 2021-07-18 DIAGNOSIS — Z7901 Long term (current) use of anticoagulants: Secondary | ICD-10-CM | POA: Diagnosis not present

## 2021-07-18 DIAGNOSIS — I2699 Other pulmonary embolism without acute cor pulmonale: Secondary | ICD-10-CM | POA: Diagnosis not present

## 2021-07-18 DIAGNOSIS — D6859 Other primary thrombophilia: Secondary | ICD-10-CM | POA: Diagnosis not present

## 2021-07-19 ENCOUNTER — Encounter: Payer: Self-pay | Admitting: Oncology

## 2021-07-19 DIAGNOSIS — M542 Cervicalgia: Secondary | ICD-10-CM | POA: Diagnosis not present

## 2021-07-19 LAB — PROTIME-INR

## 2021-07-24 NOTE — Progress Notes (Unsigned)
Subjective:    Patient ID: Paul Ryan, male    DOB: 03/14/1945, 76 y.o.   MRN: [id number]  No chief complaint on file.   HPI Patient is in today for a follow up.  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   Hyperglycemia 12/26/2014   Hyperlipidemia    Hypothyroid 06/10/2011   Insomnia 04/24/2016   Left hip pain 12/17/2010   MCI (mild cognitive impairment) 07/22/2016   Nasal lesion 07/22/2016   Polymyalgia ([place]) 03/21/2014   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   Preventative health care 12/17/2010   Shingles 1982   right abdominal wall   Staph skin infection 07/22/2016   Thyroid nodule 11/29/2014   Urinary hesitancy 09/30/2016   Vitamin D deficiency 10/14/2011    Past Surgical History:  Procedure Laterality Date   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   59 surgeries    Family History  Problem Relation Age of Onset   Cancer Mother        unknown- everywhere   Cancer Sister        breast    Social History   Socioeconomic History   Marital status: Married    Spouse name: Not on file   Number of children: Not on file   Years of education: Not on file   Highest education level: Not on file  Occupational History   Not on file  Tobacco Use   Smoking status: Former    Packs/day: 1.00    Years: 20.00    Pack years: 20.00    Types: Cigarettes    Quit date: 03/03/1980    Years since quitting: 41.4   Smokeless tobacco: Never  Substance and Sexual Activity   Alcohol use: No    Alcohol/week: 0.0 standard drinks   Drug use: No   Sexual activity: Not on file  Other Topics Concern   Not on file  Social History  Narrative   Not on file   Social Determinants of Health   Financial Resource Strain: Low Risk    Difficulty of Paying Living Expenses: Not hard at all  Food Insecurity: No Food Insecurity   Worried About Charity fundraiser in the Last Year: Never true   Arboriculturist in the Last Year: Never true  Transportation Needs: No Transportation Needs   Lack of Transportation (Medical): No   Lack of Transportation (Non-Medical): No  Physical Activity: Sufficiently Active   Days of Exercise per Week: 7 days   Minutes of Exercise per Session: 50 min  Stress: No Stress Concern Present   Feeling of Stress : Not at all  Social Connections: Moderately Isolated   Frequency of Communication with Friends and Family: More than three times a week   Frequency of Social Gatherings with Friends and Family: More than three times a week   Attends Religious Services: Never   Marine scientist or Organizations: No   Attends Archivist Meetings: Never   Marital Status: Married  Human resources officer Violence: Not At Risk   Fear of Current or Ex-Partner: No   Emotionally  Abused: No   Physically Abused: No   Sexually Abused: No    Outpatient Medications Prior to Visit  Medication Sig Dispense Refill   Ascorbic Acid (VITAMIN C ER PO) Take by mouth daily.     Cholecalciferol 4000 UNITS TABS Take 1 tablet by mouth daily.     Coenzyme Q10 (COQ10 PO) Take 2 tablets by mouth daily.     DULoxetine (CYMBALTA) 30 MG capsule Take 30 mg by mouth 2 (two) times daily.     enoxaparin (LOVENOX) 120 MG/0.8ML injection Inject 0.8 mLs (120 mg total) into the skin daily. Use only if INR < 2.0 or as instructed, discontinue when INR 2.0-3.0 30 Syringe 3   enoxaparin (LOVENOX) 120 MG/0.8ML injection Inject 0.8 mLs (120 mg total) into the skin daily. Discontinue when INR >1.5 4.8 mL 0   hydroquinone 4 % cream Apply topically 2 (two) times daily. 28.35 g 1   NON FORMULARY AHCC immune support     pantoprazole  (PROTONIX) 40 MG tablet Take 40 mg by mouth daily.     rosuvastatin (CRESTOR) 20 MG tablet Take 1 tablet (20 mg total) by mouth daily. 90 tablet 3   thyroid (ARMOUR THYROID) 60 MG tablet TAKE ONE TABLET BY MOUTH ON MONDAY, WEDNESDAY, THURSDAY, FRIDAY, AND SUNDAY. 65 tablet 1   thyroid (ARMOUR THYROID) 90 MG tablet Take 1 tablet (90 mg total) by mouth once a week. On Tuesday 10 tablet 1   tobramycin (TOBREX) 0.3 % ophthalmic solution Place 2 drops into the left eye every 6 (six) hours. 5 mL 0   warfarin (COUMADIN) 4 MG tablet TAKE 1 TABLET BY MOUTH DAILY. ADJUST DOSE AS INSTRUCTED 90 tablet 3   No facility-administered medications prior to visit.    No Known Allergies  ROS     Objective:    Physical Exam  There were no vitals taken for this visit. Wt Readings from Last 3 Encounters:  07/01/21 171 lb (77.6 kg)  06/18/21 168 lb 8 oz (76.4 kg)  05/03/21 167 lb 12.8 oz (76.1 kg)    Diabetic Foot Exam - Simple   No data filed    Lab Results  Component Value Date   WBC 6.8 03/19/2021   HGB 14.2 03/19/2021   HCT 44.2 03/19/2021   PLT 213.0 03/19/2021   GLUCOSE 105 (H) 03/19/2021   CHOL 193 03/19/2021   TRIG 172.0 (H) 03/19/2021   HDL 35.40 (L) 03/19/2021   LDLDIRECT 158.0 03/11/2013   LDLCALC 123 (H) 03/19/2021   ALT 26 03/19/2021   AST 21 03/19/2021   NA 140 03/19/2021   K 4.5 03/19/2021   CL 105 03/19/2021   CREATININE 1.12 03/19/2021   BUN 14 03/19/2021   CO2 26 03/19/2021   TSH 0.79 03/19/2021   PSA 4.29 (H) 06/20/2020   INR 3.2 (A) 05/24/2021   HGBA1C 6.7 (H) 03/19/2021   MICROALBUR 0.9 09/24/2020    Lab Results  Component Value Date   TSH 0.79 03/19/2021   Lab Results  Component Value Date   WBC 6.8 03/19/2021   HGB 14.2 03/19/2021   HCT 44.2 03/19/2021   MCV 90.0 03/19/2021   PLT 213.0 03/19/2021   Lab Results  Component Value Date   NA 140 03/19/2021   K 4.5 03/19/2021   CHLORIDE 109 11/16/2012   CO2 26 03/19/2021   GLUCOSE 105 (H) 03/19/2021    BUN 14 03/19/2021   CREATININE 1.12 03/19/2021   BILITOT 0.6 03/19/2021   ALKPHOS 55 03/19/2021  AST 21 03/19/2021   ALT 26 03/19/2021   PROT 7.2 03/19/2021   ALBUMIN 4.1 03/19/2021   CALCIUM 9.1 03/19/2021   ANIONGAP 8 09/11/2014   GFR 63.79 03/19/2021   Lab Results  Component Value Date   CHOL 193 03/19/2021   Lab Results  Component Value Date   HDL 35.40 (L) 03/19/2021   Lab Results  Component Value Date   LDLCALC 123 (H) 03/19/2021   Lab Results  Component Value Date   TRIG 172.0 (H) 03/19/2021   Lab Results  Component Value Date   CHOLHDL 5 03/19/2021   Lab Results  Component Value Date   HGBA1C 6.7 (H) 03/19/2021       Assessment & Plan:   Problem List Items Addressed This Visit   None   I am having Unknown Foley maintain his Cholecalciferol, Coenzyme Q10 (COQ10 PO), NON FORMULARY, enoxaparin, DULoxetine, Ascorbic Acid (VITAMIN C ER PO), pantoprazole, hydroquinone, warfarin, thyroid, tobramycin, thyroid, rosuvastatin, and enoxaparin.  No orders of the defined types were placed in this encounter.

## 2021-07-25 ENCOUNTER — Ambulatory Visit: Payer: Medicare PPO | Admitting: Family Medicine

## 2021-07-25 ENCOUNTER — Encounter: Payer: Self-pay | Admitting: Family Medicine

## 2021-07-25 VITALS — BP 116/70 | HR 65 | Resp 20 | Ht 72.0 in | Wt 196.6 lb

## 2021-07-25 DIAGNOSIS — E1140 Type 2 diabetes mellitus with diabetic neuropathy, unspecified: Secondary | ICD-10-CM

## 2021-07-25 DIAGNOSIS — G609 Hereditary and idiopathic neuropathy, unspecified: Secondary | ICD-10-CM

## 2021-07-25 DIAGNOSIS — E1165 Type 2 diabetes mellitus with hyperglycemia: Secondary | ICD-10-CM | POA: Diagnosis not present

## 2021-07-25 DIAGNOSIS — E039 Hypothyroidism, unspecified: Secondary | ICD-10-CM | POA: Diagnosis not present

## 2021-07-25 DIAGNOSIS — E559 Vitamin D deficiency, unspecified: Secondary | ICD-10-CM | POA: Diagnosis not present

## 2021-07-25 DIAGNOSIS — E785 Hyperlipidemia, unspecified: Secondary | ICD-10-CM

## 2021-07-25 DIAGNOSIS — D6859 Other primary thrombophilia: Secondary | ICD-10-CM

## 2021-07-25 DIAGNOSIS — R739 Hyperglycemia, unspecified: Secondary | ICD-10-CM

## 2021-07-25 NOTE — Progress Notes (Signed)
Subjective:   By signing my name below, I, Paul Ryan, attest that this documentation has been prepared under the direction and in the presence of Paul Lukes, MD. 07/25/2021       Patient ID: Unknown Foley, male    DOB: [birth year], 76 y.o.   MRN: [id number]  Chief Complaint  Patient presents with   Follow-up    HPI Patient is in today for an office visit and 4 months f/u.  He has a colonoscopy scheduled tomorrow and has been off Warfarin for a couple of days.   He is doing good at this time. No recent illnesses or ED visits.  He recently saw Dr Paul Ryan and had a calcium coronary screening.   He has 3 Covid-19 vaccines at this time and is interested in receiving a booster vaccine.   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   Hyperglycemia 12/26/2014   Hyperlipidemia    Hypothyroid 06/10/2011   Insomnia 04/24/2016   Left hip pain 12/17/2010   MCI (mild cognitive impairment) 07/22/2016   Nasal lesion 07/22/2016   Polymyalgia ([place]) 03/21/2014   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   Preventative health care 12/17/2010   Shingles 1982   right abdominal wall   Staph skin infection 07/22/2016   Thyroid nodule 11/29/2014   Urinary hesitancy 09/30/2016   Vitamin D deficiency 10/14/2011    Past Surgical History:  Procedure Laterality Date   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   33 surgeries    Family History  Problem Relation Age of Onset   Cancer Mother        unknown- everywhere   Cancer Sister        breast    Social History   Socioeconomic History   Marital status: Married    Spouse name: Not on file   Number  of children: Not on file   Years of education: Not on file   Highest education level: Not on file  Occupational History   Not on file  Tobacco Use   Smoking status: Former    Packs/day: 1.00    Years: 20.00    Pack years: 20.00    Types: Cigarettes    Quit date: 03/03/1980    Years since quitting: 41.4   Smokeless tobacco: Never  Substance and Sexual Activity   Alcohol use: No    Alcohol/week: 0.0 standard drinks   Drug use: No   Sexual activity: Not on file  Other Topics Concern   Not on file  Social History Narrative   Not on file   Social Determinants of Health   Financial Resource Strain: Low Risk    Difficulty of Paying Living Expenses: Not hard at all  Food Insecurity: No Food Insecurity   Worried About Charity fundraiser in the Last Year: Never true   Arboriculturist in the Last Year: Never true  Transportation Needs: No Transportation Needs   Lack of Transportation (Medical): No   Lack of Transportation (Non-Medical): No  Physical Activity: Sufficiently Active   Days of Exercise per Week: 7 days  Minutes of Exercise per Session: 50 min  Stress: No Stress Concern Present   Feeling of Stress : Not at all  Social Connections: Moderately Isolated   Frequency of Communication with Friends and Family: More than three times a week   Frequency of Social Gatherings with Friends and Family: More than three times a week   Attends Religious Services: Never   Marine scientist or Organizations: No   Attends Music therapist: Never   Marital Status: Married  Human resources officer Violence: Not At Risk   Fear of Current or Ex-Partner: No   Emotionally Abused: No   Physically Abused: No   Sexually Abused: No    Outpatient Medications Prior to Visit  Medication Sig Dispense Refill   Ascorbic Acid (VITAMIN C ER PO) Take by mouth daily.     Cholecalciferol 4000 UNITS TABS Take 1 tablet by mouth daily.     Coenzyme Q10 (COQ10 PO) Take 2 tablets by mouth  daily.     DULoxetine (CYMBALTA) 30 MG capsule Take 30 mg by mouth 2 (two) times daily.     enoxaparin (LOVENOX) 120 MG/0.8ML injection Inject 0.8 mLs (120 mg total) into the skin daily. Use only if INR < 2.0 or as instructed, discontinue when INR 2.0-3.0 30 Syringe 3   hydroquinone 4 % cream Apply topically 2 (two) times daily. 28.35 g 1   NON FORMULARY [facility] immune support     pantoprazole (PROTONIX) 40 MG tablet Take 40 mg by mouth daily.     rosuvastatin (CRESTOR) 20 MG tablet Take 1 tablet (20 mg total) by mouth daily. 90 tablet 3   thyroid (ARMOUR THYROID) 60 MG tablet TAKE ONE TABLET BY MOUTH ON MONDAY, WEDNESDAY, THURSDAY, FRIDAY, AND SUNDAY. 65 tablet 1   thyroid (ARMOUR THYROID) 90 MG tablet Take 1 tablet (90 mg total) by mouth once a week. On Tuesday 10 tablet 1   warfarin (COUMADIN) 4 MG tablet TAKE 1 TABLET BY MOUTH DAILY. ADJUST DOSE AS INSTRUCTED 90 tablet 3   enoxaparin (LOVENOX) 120 MG/0.8ML injection Inject 0.8 mLs (120 mg total) into the skin daily. Discontinue when INR >1.5 (Patient not taking: Reported on 07/25/2021) 4.8 mL 0   tobramycin (TOBREX) 0.3 % ophthalmic solution Place 2 drops into the left eye every 6 (six) hours. 5 mL 0   No facility-administered medications prior to visit.    No Known Allergies  Review of Systems  Constitutional:  Negative for fever.  HENT:  Negative for congestion.   Eyes:  Negative for pain.  Respiratory:  Negative for shortness of breath.   Cardiovascular:  Negative for chest pain, palpitations and leg swelling.  Gastrointestinal:  Negative for abdominal pain, blood in stool, diarrhea and nausea.  Genitourinary:  Negative for dysuria and frequency.  Musculoskeletal:  Negative for back pain.  Neurological:  Negative for headaches.      Objective:    Physical Exam Constitutional:      Appearance: Normal appearance. He is not ill-appearing.  HENT:     Head: Normocephalic and atraumatic.     Right Ear: Tympanic membrane, ear canal  and external ear normal.     Left Ear: Tympanic membrane, ear canal and external ear normal.  Eyes:     Conjunctiva/sclera: Conjunctivae normal.  Cardiovascular:     Rate and Rhythm: Normal rate and regular rhythm.     Heart sounds: Normal heart sounds. No murmur heard. Pulmonary:     Breath sounds: Normal breath sounds. No  wheezing.  Abdominal:     General: Bowel sounds are normal. There is no distension.     Palpations: Abdomen is soft.     Tenderness: There is no abdominal tenderness.     Hernia: No hernia is present.  Musculoskeletal:     Cervical back: Neck supple.  Lymphadenopathy:     Cervical: No cervical adenopathy.  Skin:    General: Skin is warm and dry.  Neurological:     Mental Status: He is alert and oriented to person, place, and time.  Psychiatric:        Behavior: Behavior normal.    BP 116/70 (BP Location: Left Arm, Patient Position: Sitting, Cuff Size: Normal)   Pulse 65   Resp 20   Ht 6' (1.829 m)   Wt 196 lb 9.6 oz (89.2 kg)   SpO2 97%   BMI 26.66 kg/m  Wt Readings from Last 3 Encounters:  07/25/21 196 lb 9.6 oz (89.2 kg)  07/01/21 171 lb (77.6 kg)  06/18/21 168 lb 8 oz (76.4 kg)    Diabetic Foot Exam - Simple   No data filed    Lab Results  Component Value Date   WBC 7.4 07/25/2021   HGB 14.5 07/25/2021   HCT 43.5 07/25/2021   PLT 202.0 07/25/2021   GLUCOSE 92 07/25/2021   CHOL 201 (H) 07/25/2021   TRIG 173.0 (H) 07/25/2021   HDL 39.00 (L) 07/25/2021   LDLDIRECT 158.0 03/11/2013   LDLCALC 128 (H) 07/25/2021   ALT 26 07/25/2021   AST 26 07/25/2021   NA 142 07/25/2021   K 4.7 07/25/2021   CL 106 07/25/2021   CREATININE 1.15 07/25/2021   BUN 18 07/25/2021   CO2 28 07/25/2021   TSH 0.28 (L) 07/25/2021   PSA 4.29 (H) 06/20/2020   INR 3.2 (A) 05/24/2021   HGBA1C 6.8 (H) 07/25/2021   MICROALBUR 0.9 09/24/2020    Lab Results  Component Value Date   TSH 0.28 (L) 07/25/2021   Lab Results  Component Value Date   WBC 7.4  07/25/2021   HGB 14.5 07/25/2021   HCT 43.5 07/25/2021   MCV 88.4 07/25/2021   PLT 202.0 07/25/2021   Lab Results  Component Value Date   NA 142 07/25/2021   K 4.7 07/25/2021   CHLORIDE 109 11/16/2012   CO2 28 07/25/2021   GLUCOSE 92 07/25/2021   BUN 18 07/25/2021   CREATININE 1.15 07/25/2021   BILITOT 0.7 07/25/2021   ALKPHOS 68 07/25/2021   AST 26 07/25/2021   ALT 26 07/25/2021   PROT 7.0 07/25/2021   ALBUMIN 4.3 07/25/2021   CALCIUM 9.2 07/25/2021   ANIONGAP 8 09/11/2014   GFR 61.64 07/25/2021   Lab Results  Component Value Date   CHOL 201 (H) 07/25/2021   Lab Results  Component Value Date   HDL 39.00 (L) 07/25/2021   Lab Results  Component Value Date   LDLCALC 128 (H) 07/25/2021   Lab Results  Component Value Date   TRIG 173.0 (H) 07/25/2021   Lab Results  Component Value Date   CHOLHDL 5 07/25/2021   Lab Results  Component Value Date   HGBA1C 6.8 (H) 07/25/2021       Assessment & Plan:   Problem List Items Addressed This Visit     Hyperlipidemia    Tolerating statin, encouraged heart healthy diet, avoid trans fats, minimize simple carbs and saturated fats. Increase exercise as tolerated       Relevant Orders   Lipid  panel (Completed)   Hereditary protein S deficiency ([name])    Tolerating Coumadin and monitors his at home with a monitor working with Hematology       Hypothyroid - Primary    Continue to monitor and adjust meds       Relevant Orders   TSH (Completed)   Vitamin D deficiency    Supplement and monitor       Hyperglycemia    hgba1c acceptable, minimize simple carbs. Increase exercise as tolerated.        Relevant Orders   Hemoglobin A1c (Completed)   Idiopathic peripheral neuropathy   Relevant Orders   CBC (Completed)   Other Visit Diagnoses     Type 2 diabetes mellitus with diabetic neuropathy, without long-term current use of insulin ([name])       Relevant Orders   Comprehensive metabolic panel (Completed)    Hemoglobin A1c (Completed)       No orders of the defined types were placed in this encounter.   I,Paul Ryan,acting as a Education administrator for Penni Homans, MD.,have documented all relevant documentation on the behalf of Penni Homans, MD,as directed by  Penni Homans, MD while in the presence of Penni Homans, MD.   I, Paul Lukes, MD., personally preformed the services described in this documentation.  All medical record entries made by the scribe were at my direction and in my presence.  I have reviewed the chart and discharge instructions (if applicable) and agree that the record reflects my personal performance and is accurate and complete. 07/25/2021

## 2021-07-25 NOTE — Patient Instructions (Signed)

## 2021-07-26 DIAGNOSIS — K5730 Diverticulosis of large intestine without perforation or abscess without bleeding: Secondary | ICD-10-CM | POA: Diagnosis not present

## 2021-07-26 DIAGNOSIS — K6389 Other specified diseases of intestine: Secondary | ICD-10-CM | POA: Diagnosis not present

## 2021-07-26 DIAGNOSIS — K209 Esophagitis, unspecified without bleeding: Secondary | ICD-10-CM | POA: Diagnosis not present

## 2021-07-26 DIAGNOSIS — K449 Diaphragmatic hernia without obstruction or gangrene: Secondary | ICD-10-CM | POA: Diagnosis not present

## 2021-07-26 DIAGNOSIS — Z1211 Encounter for screening for malignant neoplasm of colon: Secondary | ICD-10-CM | POA: Diagnosis not present

## 2021-07-26 DIAGNOSIS — K648 Other hemorrhoids: Secondary | ICD-10-CM | POA: Diagnosis not present

## 2021-07-26 LAB — COMPREHENSIVE METABOLIC PANEL
ALT: 26 U/L (ref 0–53)
AST: 26 U/L (ref 0–37)
Albumin: 4.3 g/dL (ref 3.5–5.2)
Alkaline Phosphatase: 68 U/L (ref 39–117)
BUN: 18 mg/dL (ref 6–23)
CO2: 28 mEq/L (ref 19–32)
Calcium: 9.2 mg/dL (ref 8.4–10.5)
Chloride: 106 mEq/L (ref 96–112)
Creatinine, Ser: 1.15 mg/dL (ref 0.40–1.50)
GFR: 61.64 mL/min (ref >60.00)
Glucose, Bld: 92 mg/dL (ref 70–99)
Potassium: 4.7 mEq/L (ref 3.5–5.1)
Sodium: 142 mEq/L (ref 135–145)
Total Bilirubin: 0.7 mg/dL (ref 0.2–1.2)
Total Protein: 7 g/dL (ref 6.0–8.3)

## 2021-07-26 LAB — CBC
HCT: 43.5 % (ref 39.0–52.0)
Hemoglobin: 14.5 g/dL (ref 13.0–17.0)
MCHC: 33.3 g/dL (ref 30.0–36.0)
MCV: 88.4 fl (ref 78.0–100.0)
Platelets: 202 10*3/uL (ref 150.0–400.0)
RBC: 4.92 Mil/uL (ref 4.22–5.81)
RDW: 14.4 % (ref 11.5–15.5)
WBC: 7.4 10*3/uL (ref 4.0–10.5)

## 2021-07-26 LAB — LIPID PANEL
Cholesterol: 201 mg/dL — ABNORMAL HIGH (ref 0–200)
HDL: 39 mg/dL — ABNORMAL LOW (ref >39.00)
LDL Cholesterol: 128 mg/dL — ABNORMAL HIGH (ref 0–99)
NonHDL: 162.39
Total CHOL/HDL Ratio: 5
Triglycerides: 173 mg/dL — ABNORMAL HIGH (ref 0.0–149.0)
VLDL: 34.6 mg/dL (ref 0.0–40.0)

## 2021-07-26 LAB — HEMOGLOBIN A1C: Hgb A1c MFr Bld: 6.8 % — ABNORMAL HIGH (ref 4.6–6.5)

## 2021-07-26 LAB — TSH: TSH: 0.28 u[IU]/mL — ABNORMAL LOW (ref 0.35–5.50)

## 2021-07-28 ENCOUNTER — Encounter: Payer: Self-pay | Admitting: Cardiology

## 2021-07-28 ENCOUNTER — Encounter: Payer: Self-pay | Admitting: Oncology

## 2021-07-29 ENCOUNTER — Encounter: Payer: Self-pay | Admitting: Oncology

## 2021-07-29 LAB — PROTIME-INR

## 2021-07-29 NOTE — Assessment & Plan Note (Signed)
hgba1c acceptable, minimize simple carbs. Increase exercise as tolerated.  

## 2021-07-29 NOTE — Assessment & Plan Note (Signed)
Tolerating statin, encouraged heart healthy diet, avoid trans fats, minimize simple carbs and saturated fats. Increase exercise as tolerated 

## 2021-07-29 NOTE — Assessment & Plan Note (Signed)
Supplement and monitor 

## 2021-07-29 NOTE — Assessment & Plan Note (Signed)
Tolerating Coumadin and monitors his at home with a monitor working with Hematology

## 2021-07-29 NOTE — Assessment & Plan Note (Signed)
Continue to monitor and adjust meds

## 2021-08-01 ENCOUNTER — Encounter: Payer: Self-pay | Admitting: Oncology

## 2021-08-02 ENCOUNTER — Encounter: Payer: Self-pay | Admitting: Oncology

## 2021-08-02 LAB — PROTIME-INR

## 2021-08-08 ENCOUNTER — Other Ambulatory Visit: Payer: Medicare PPO

## 2021-08-20 ENCOUNTER — Encounter: Payer: Self-pay | Admitting: Family Medicine

## 2021-08-20 ENCOUNTER — Other Ambulatory Visit: Payer: Self-pay | Admitting: Family Medicine

## 2021-08-20 MED ORDER — HYDROCODONE BIT-HOMATROP MBR 5-1.5 MG/5ML PO SOLN: 2.5000 mL | Freq: Two times a day (BID) | ORAL | 0 refills | Status: DC | PRN

## 2021-08-25 ENCOUNTER — Encounter: Payer: Self-pay | Admitting: Oncology

## 2021-08-26 ENCOUNTER — Encounter: Payer: Self-pay | Admitting: Oncology

## 2021-08-30 DIAGNOSIS — M542 Cervicalgia: Secondary | ICD-10-CM | POA: Diagnosis not present

## 2021-08-30 IMAGING — DX DG KNEE AP/LAT W/ SUNRISE*L*
3 series · 3 of 3 positions shown · non-contrast
Comparison: None.

CLINICAL DATA: 74-year-old male with medial left knee pain for 4
weeks with no known injury.

EXAM:
LEFT KNEE 3 VIEWS

[knee ap]
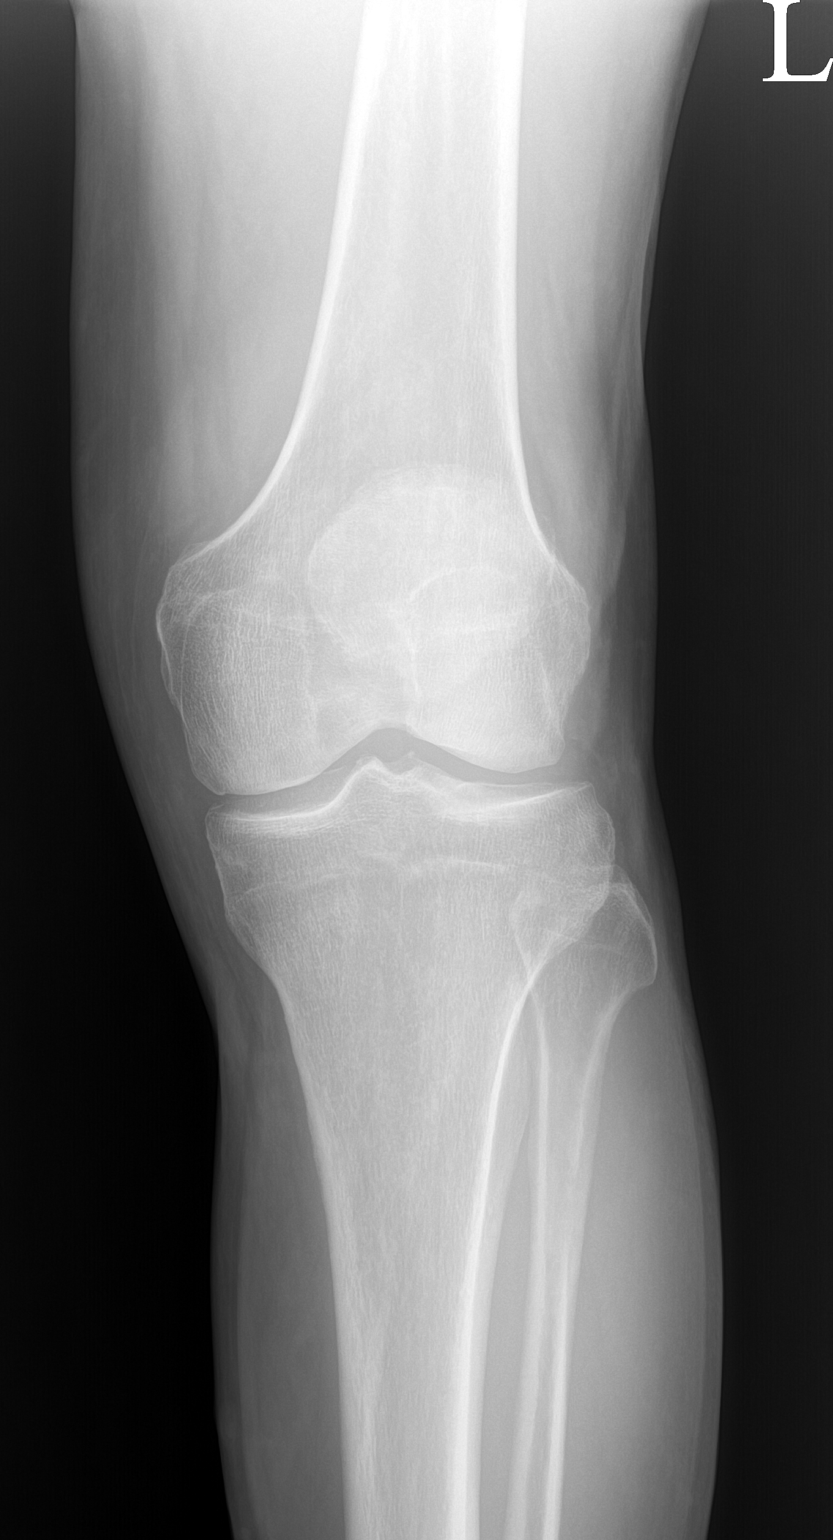

[knee lat]
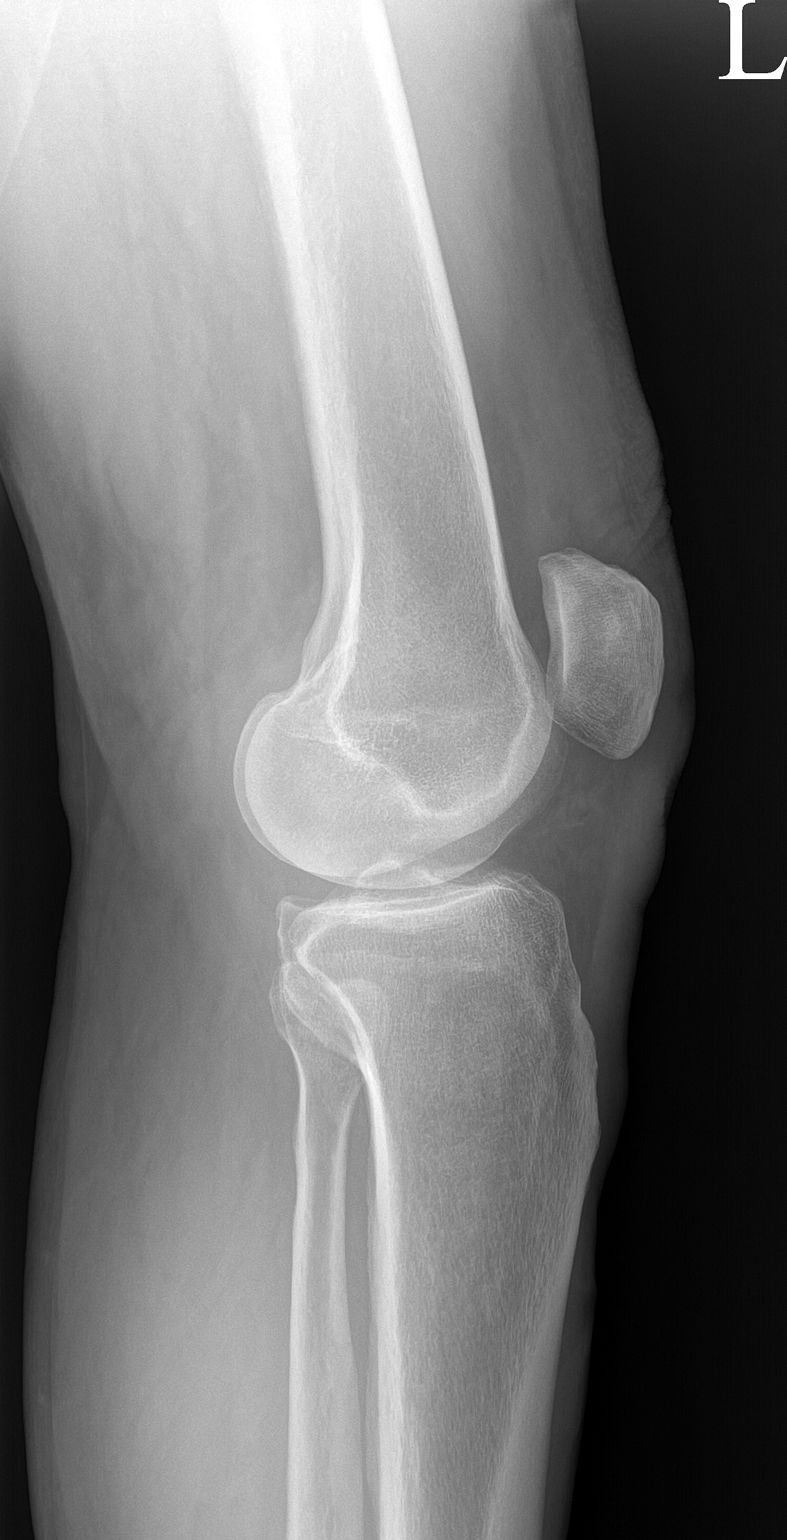

[patella]
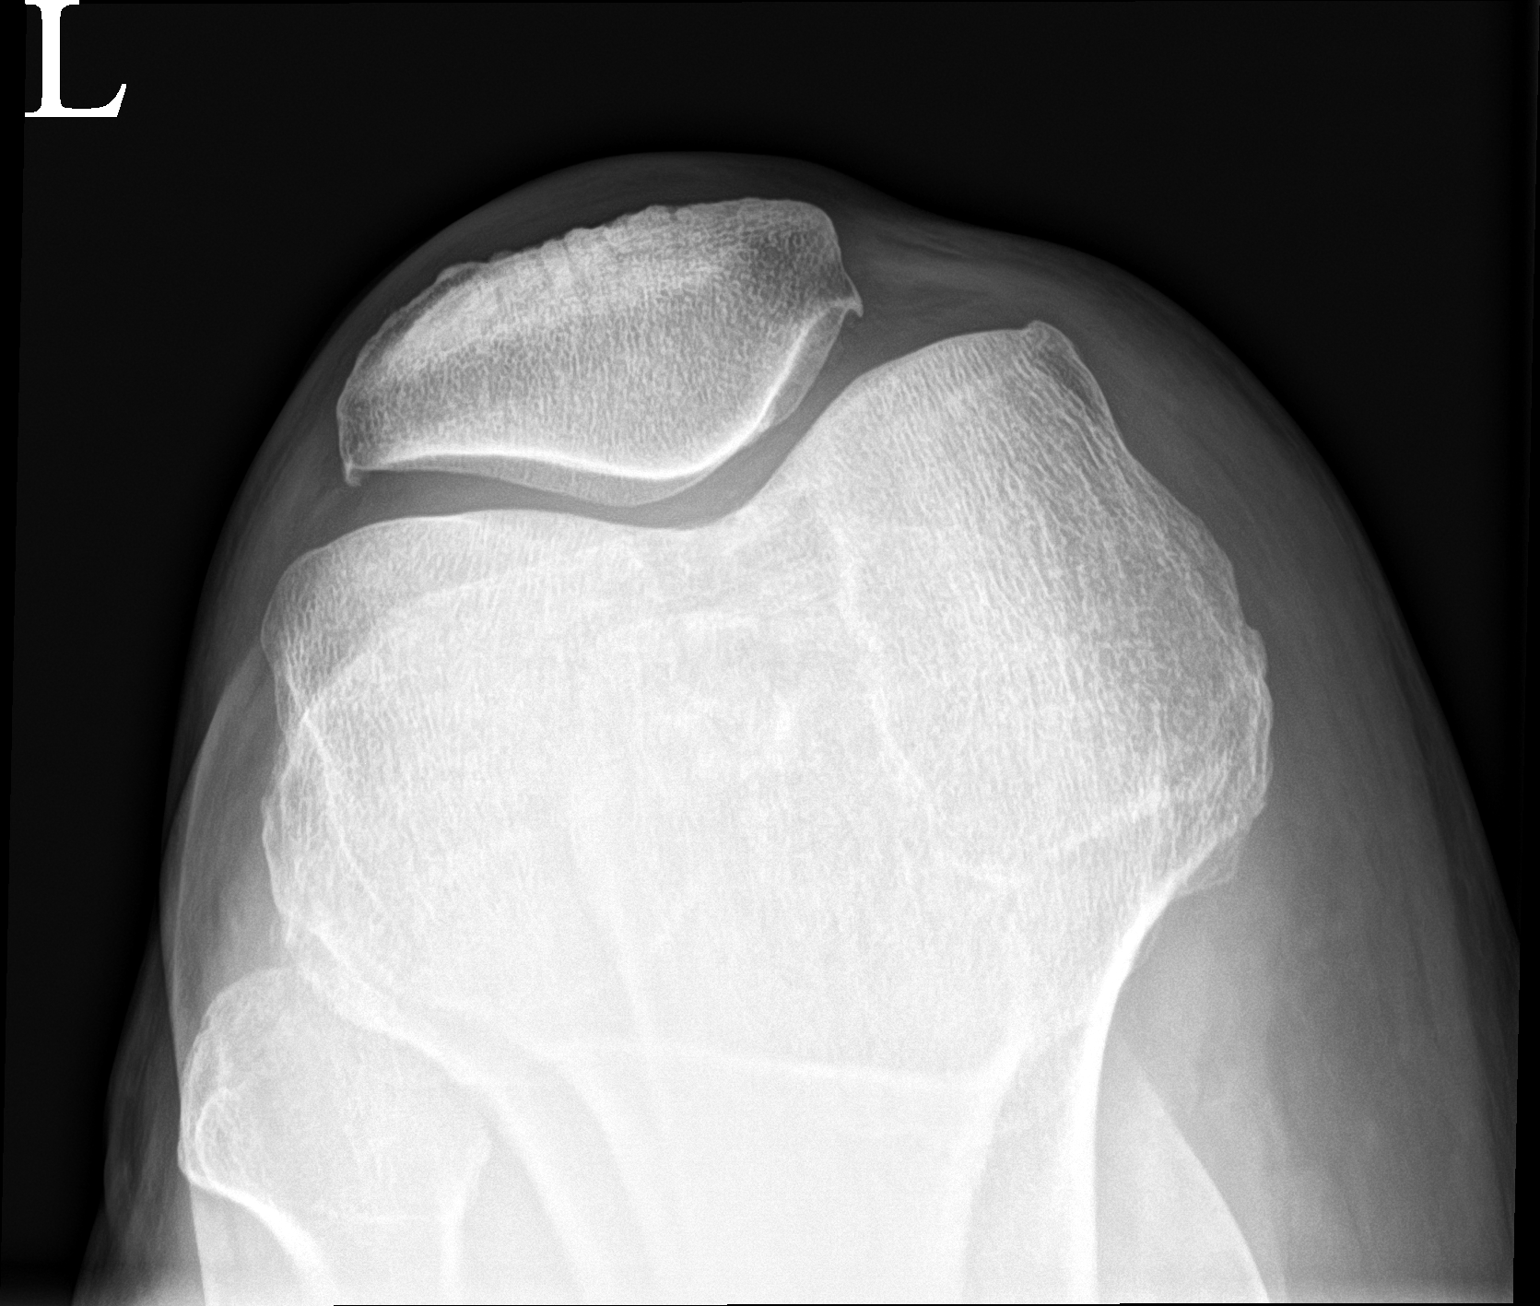

[3 of 3 positions shown; findings below may reference images not displayed]

FINDINGS: Bone mineralization is within normal limits. Preserved alignment at
the left knee. No joint effusion. No acute osseous abnormality
identified.

Joint spaces appear normal for age. Minimal degenerative spurring.
Soft tissue contours appear within normal limits.
IMPRESSION: Normal for age radiographic appearance of the left knee.

## 2021-09-07 ENCOUNTER — Encounter: Payer: Self-pay | Admitting: Oncology

## 2021-09-07 LAB — POCT INR: INR: 2.2 (ref 2.0–3.0)

## 2021-09-11 ENCOUNTER — Telehealth: Payer: Self-pay | Admitting: Family Medicine

## 2021-09-11 NOTE — Telephone Encounter (Signed)
Left message for patient to call back and schedule Medicare Annual Wellness Visit (AWV).   Please offer to do virtually or by telephone.  Left office number and my jabber [phone].  Last AWV:09/19/2020  Please schedule at anytime with Nurse Health Advisor.

## 2021-09-27 ENCOUNTER — Telehealth: Payer: Self-pay | Admitting: Family Medicine

## 2021-09-27 NOTE — Telephone Encounter (Signed)
Left message for patient to call back and schedule Medicare Annual Wellness Visit (AWV).   Please offer to do virtually or by telephone.  Left office number and my jabber [phone].  Last AWV:09/19/2020  Please schedule at anytime with Nurse Health Advisor.

## 2021-09-29 ENCOUNTER — Encounter: Payer: Self-pay | Admitting: Oncology

## 2021-09-29 LAB — POCT INR

## 2021-10-09 ENCOUNTER — Telehealth: Payer: Self-pay | Admitting: Family Medicine

## 2021-10-09 NOTE — Telephone Encounter (Signed)
Left message for patient to call back and schedule Medicare Annual Wellness Visit (AWV).   Please offer to do virtually or by telephone.  Left office number and my jabber [phone].  Last AWV: 09/19/2020  Please schedule at anytime with Nurse Health Advisor.

## 2021-11-07 DIAGNOSIS — Z7901 Long term (current) use of anticoagulants: Secondary | ICD-10-CM | POA: Diagnosis not present

## 2021-11-07 DIAGNOSIS — D6859 Other primary thrombophilia: Secondary | ICD-10-CM | POA: Diagnosis not present

## 2021-11-07 DIAGNOSIS — I2699 Other pulmonary embolism without acute cor pulmonale: Secondary | ICD-10-CM | POA: Diagnosis not present

## 2021-11-15 DIAGNOSIS — M542 Cervicalgia: Secondary | ICD-10-CM | POA: Diagnosis not present

## 2021-11-22 DIAGNOSIS — M542 Cervicalgia: Secondary | ICD-10-CM | POA: Diagnosis not present

## 2021-11-27 DIAGNOSIS — M47817 Spondylosis without myelopathy or radiculopathy, lumbosacral region: Secondary | ICD-10-CM | POA: Diagnosis not present

## 2021-11-27 DIAGNOSIS — M79641 Pain in right hand: Secondary | ICD-10-CM | POA: Diagnosis not present

## 2021-11-27 DIAGNOSIS — M2550 Pain in unspecified joint: Secondary | ICD-10-CM | POA: Diagnosis not present

## 2021-11-27 DIAGNOSIS — M159 Polyosteoarthritis, unspecified: Secondary | ICD-10-CM | POA: Diagnosis not present

## 2021-11-27 DIAGNOSIS — I700 Atherosclerosis of aorta: Secondary | ICD-10-CM | POA: Diagnosis not present

## 2021-11-27 DIAGNOSIS — M47812 Spondylosis without myelopathy or radiculopathy, cervical region: Secondary | ICD-10-CM | POA: Diagnosis not present

## 2021-11-27 DIAGNOSIS — M50321 Other cervical disc degeneration at C4-C5 level: Secondary | ICD-10-CM | POA: Diagnosis not present

## 2021-11-27 DIAGNOSIS — Z9181 History of falling: Secondary | ICD-10-CM | POA: Diagnosis not present

## 2021-11-27 DIAGNOSIS — E039 Hypothyroidism, unspecified: Secondary | ICD-10-CM | POA: Diagnosis not present

## 2021-11-27 DIAGNOSIS — M5136 Other intervertebral disc degeneration, lumbar region: Secondary | ICD-10-CM | POA: Diagnosis not present

## 2021-11-27 DIAGNOSIS — M438X2 Other specified deforming dorsopathies, cervical region: Secondary | ICD-10-CM | POA: Diagnosis not present

## 2021-11-27 DIAGNOSIS — M5412 Radiculopathy, cervical region: Secondary | ICD-10-CM | POA: Diagnosis not present

## 2021-11-27 DIAGNOSIS — E559 Vitamin D deficiency, unspecified: Secondary | ICD-10-CM | POA: Diagnosis not present

## 2021-11-27 DIAGNOSIS — K219 Gastro-esophageal reflux disease without esophagitis: Secondary | ICD-10-CM | POA: Diagnosis not present

## 2021-11-27 DIAGNOSIS — M353 Polymyalgia rheumatica: Secondary | ICD-10-CM | POA: Diagnosis not present

## 2021-11-27 DIAGNOSIS — M5137 Other intervertebral disc degeneration, lumbosacral region: Secondary | ICD-10-CM | POA: Diagnosis not present

## 2021-11-27 DIAGNOSIS — R5382 Chronic fatigue, unspecified: Secondary | ICD-10-CM | POA: Diagnosis not present

## 2021-11-27 DIAGNOSIS — M47816 Spondylosis without myelopathy or radiculopathy, lumbar region: Secondary | ICD-10-CM | POA: Diagnosis not present

## 2021-12-02 ENCOUNTER — Encounter: Payer: Self-pay | Admitting: Family Medicine

## 2021-12-02 ENCOUNTER — Other Ambulatory Visit: Payer: Self-pay | Admitting: Family Medicine

## 2021-12-02 DIAGNOSIS — D126 Benign neoplasm of colon, unspecified: Secondary | ICD-10-CM

## 2021-12-03 NOTE — Telephone Encounter (Signed)
Spoke with patient and his pain is about the same no other changes.  They did not do MRI.  Advised him of plan but he stated that he will call his Dr. In [place] back again.  If they are unable to help then he will call us back to let us know.  Also advised him of the referral to our GI.

## 2021-12-04 ENCOUNTER — Encounter: Payer: Self-pay | Admitting: Family Medicine

## 2021-12-04 NOTE — Telephone Encounter (Signed)
Patient called back to say when he called the doctor in [place] and they told him it would be December before he could be seen. Patient said he is caught between a rock and a hard place. His neck is hurting him everyday. He wanted to know if there Is anything that can be done to get him in sooner. Please call patient to discuss.

## 2021-12-05 ENCOUNTER — Other Ambulatory Visit: Payer: Self-pay | Admitting: Family Medicine

## 2021-12-05 ENCOUNTER — Encounter: Payer: Self-pay | Admitting: Pulmonary Disease

## 2021-12-05 ENCOUNTER — Encounter: Payer: Self-pay | Admitting: Cardiology

## 2021-12-05 ENCOUNTER — Encounter: Payer: Self-pay | Admitting: Oncology

## 2021-12-05 DIAGNOSIS — M542 Cervicalgia: Secondary | ICD-10-CM

## 2021-12-06 ENCOUNTER — Telehealth: Payer: Self-pay | Admitting: Family Medicine

## 2021-12-06 ENCOUNTER — Other Ambulatory Visit: Payer: Self-pay | Admitting: Family Medicine

## 2021-12-06 DIAGNOSIS — M542 Cervicalgia: Secondary | ICD-10-CM

## 2021-12-06 DIAGNOSIS — W19XXXA Unspecified fall, initial encounter: Secondary | ICD-10-CM

## 2021-12-06 MED ORDER — METHYLPREDNISOLONE 4 MG PO TBPK: ORAL_TABLET | ORAL | 0 refills | Status: DC

## 2021-12-06 MED ORDER — TIZANIDINE HCL 2 MG PO TABS: 1.0000 mg | ORAL_TABLET | Freq: Four times a day (QID) | ORAL | 1 refills | Status: DC | PRN

## 2021-12-06 NOTE — Telephone Encounter (Signed)
Spoke with patient by phone. He fell backwards while trying to help lift a heavy package about 1.5 weeks ago. He fell on his bum but snapped his neck back and has had excruciating pain since then. The pain is primarily on the left side of the neck and does not radiate down the arm. During the day he rates the pain as 4/10 at night can increase to 10/10. He has an appt with [facility] in [place] next Tuesday. He had an xray with [facility] on 11/27/2021 which is reviewed and shows no acute fracture. Suspect significant muscle spasm, ordered MRI of neck and sent in a Medrol dosepak taper and Tizanidine 1-4 mg prn. Encouraged moist heat and no heavy lifting.

## 2021-12-06 NOTE — Telephone Encounter (Signed)
Dr. Valeta Harms, pt is wanting to see a neurologist and is requesting if you have a preference to who he sees. Thanks.

## 2021-12-06 NOTE — Telephone Encounter (Signed)
Patient wants something done sooner than December.  Can we go ahead and start the process with seeing if he can be seen in town.  No luck getting in sooner with his provider in [place].

## 2021-12-10 DIAGNOSIS — M5451 Vertebrogenic low back pain: Secondary | ICD-10-CM | POA: Diagnosis not present

## 2021-12-10 DIAGNOSIS — M542 Cervicalgia: Secondary | ICD-10-CM | POA: Diagnosis not present

## 2021-12-13 DIAGNOSIS — M542 Cervicalgia: Secondary | ICD-10-CM | POA: Diagnosis not present

## 2021-12-14 ENCOUNTER — Encounter: Payer: Self-pay | Admitting: Oncology

## 2021-12-17 ENCOUNTER — Ambulatory Visit: Payer: Medicare PPO | Admitting: Family Medicine

## 2021-12-17 VITALS — BP 116/74 | HR 68 | Temp 98.0°F | Resp 16 | Ht 72.0 in | Wt 169.0 lb

## 2021-12-17 DIAGNOSIS — I739 Peripheral vascular disease, unspecified: Secondary | ICD-10-CM | POA: Diagnosis not present

## 2021-12-17 DIAGNOSIS — G609 Hereditary and idiopathic neuropathy, unspecified: Secondary | ICD-10-CM | POA: Diagnosis not present

## 2021-12-17 DIAGNOSIS — E785 Hyperlipidemia, unspecified: Secondary | ICD-10-CM

## 2021-12-17 DIAGNOSIS — G6289 Other specified polyneuropathies: Secondary | ICD-10-CM

## 2021-12-17 DIAGNOSIS — R739 Hyperglycemia, unspecified: Secondary | ICD-10-CM

## 2021-12-17 DIAGNOSIS — E039 Hypothyroidism, unspecified: Secondary | ICD-10-CM | POA: Diagnosis not present

## 2021-12-17 DIAGNOSIS — E559 Vitamin D deficiency, unspecified: Secondary | ICD-10-CM

## 2021-12-17 MED ORDER — GABAPENTIN 100 MG PO CAPS: 100.0000 mg | ORAL_CAPSULE | Freq: Every day | ORAL | 2 refills | Status: DC

## 2021-12-17 NOTE — Progress Notes (Signed)
Subjective:   By signing my name below, I, Kellie Simmering, attest that this documentation has been prepared under the direction and in the presence of [name], MD., 12/17/2021.     Patient ID: Paul Ryan, male    DOB: 30-Oct-1945, 76 y.o.   MRN: [id number]  No chief complaint on file.  HPI Patient is in today for an office visit.  Gastroenterology: He says that he is not currently experiencing gastroenterological issues, but is seeking a new gastroenterologist.   Immunizations: He has received high-dose Flu and RSV immunizations. He will receive the COVID-19 immunization next week.   Orthopedic: He has been seeing Dr. Suella Broad at [facility] to manage his neck pain. He rates his neck pain as 3/10.  Peripheral Neuropathy: He reports that he has been experiencing bilateral foot burning that originated in his right foot, but later also spread to his left foot. He further reports that the burning remains in his feet and does not radiate elsewhere.   Supplements: He currently takes multivitamins and vitamin D supplements.   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   Hyperglycemia 12/26/2014   Hyperlipidemia    Hypothyroid 06/10/2011   Insomnia 04/24/2016   Left hip pain 12/17/2010   MCI (mild cognitive impairment) 07/22/2016   Nasal lesion 07/22/2016   Polymyalgia ([place]) 03/21/2014   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   Preventative health care 12/17/2010   Shingles 1982   right abdominal wall   Staph skin infection 07/22/2016   Thyroid nodule 11/29/2014   Urinary hesitancy 09/30/2016   Vitamin D deficiency 10/14/2011    Past Surgical History:  Procedure Laterality Date   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   66 surgeries   Family History  Problem Relation Age of Onset   Cancer Mother        unknown- everywhere   Cancer Sister        breast   Social History   Socioeconomic History   Marital status: Married    Spouse name: Not on file   Number of children: Not on file   Years of education: Not on file   Highest education level: Not on file  Occupational History   Not on file  Tobacco Use   Smoking status: Former    Packs/day: 1.00    Years: 20.00    Total pack years: 20.00    Types: Cigarettes    Quit date: 03/03/1980    Years since quitting: 41.8   Smokeless tobacco: Never  Substance and Sexual Activity   Alcohol use: No    Alcohol/week: 0.0 standard drinks of alcohol   Drug use: No   Sexual activity: Not on file  Other Topics Concern   Not on file  Social History Narrative   Not on file   Social Determinants of Health   Financial Resource Strain: Low Risk  (09/19/2020)   Overall Financial Resource Strain (CARDIA)    Difficulty of Paying Living Expenses: Not hard at all  Food Insecurity: No Food Insecurity (09/19/2020)   Hunger Vital Sign    Worried About Running  Out of Food in the Last Year: Never true    Ran Out of Food in the Last Year: Never true  Transportation Needs: No Transportation Needs (09/19/2020)   PRAPARE - Hydrologist (Medical): No    Lack of Transportation (Non-Medical): No  Physical Activity: Sufficiently Active (09/19/2020)   Exercise Vital Sign    Days of Exercise per Week: 7 days    Minutes of Exercise per Session: 50 min  Stress: No Stress Concern Present (09/19/2020)   [place]    Feeling of Stress : Not at all  Social Connections: Moderately Isolated (09/19/2020)   Social Connection and Isolation Panel [NHANES]    Frequency of Communication with Friends  and Family: More than three times a week    Frequency of Social Gatherings with Friends and Family: More than three times a week    Attends Religious Services: Never    Marine scientist or Organizations: No    Attends Archivist Meetings: Never    Marital Status: Married  Human resources officer Violence: Not At Risk (09/19/2020)   Humiliation, Afraid, Rape, and Kick questionnaire    Fear of Current or Ex-Partner: No    Emotionally Abused: No    Physically Abused: No    Sexually Abused: No   Outpatient Medications Prior to Visit  Medication Sig Dispense Refill   Ascorbic Acid (VITAMIN C ER PO) Take by mouth daily.     Cholecalciferol 4000 UNITS TABS Take 1 tablet by mouth daily.     Coenzyme Q10 (COQ10 PO) Take 2 tablets by mouth daily.     DULoxetine (CYMBALTA) 30 MG capsule Take 30 mg by mouth 2 (two) times daily.     enoxaparin (LOVENOX) 120 MG/0.8ML injection Inject 0.8 mLs (120 mg total) into the skin daily. Use only if INR < 2.0 or as instructed, discontinue when INR 2.0-3.0 30 Syringe 3   enoxaparin (LOVENOX) 120 MG/0.8ML injection Inject 0.8 mLs (120 mg total) into the skin daily. Discontinue when INR >1.5 (Patient not taking: Reported on 07/25/2021) 4.8 mL 0   HYDROcodone bit-homatropine (HYDROMET) 5-1.5 MG/5ML syrup Take 2.5-5 mLs by mouth 2 (two) times daily as needed for cough. 120 mL 0   hydroquinone 4 % cream Apply topically 2 (two) times daily. 28.35 g 1   methylPREDNISolone (MEDROL DOSEPAK) 4 MG TBPK tablet 6 tabs po x 1 day then 5 tabs po x 1 day then 4 tabs po x 1 day then 3 tabs po x 1 day then 2 tabs po x 1 day then 1 tab po x 1 day and stop 21 tablet 0   NON FORMULARY AH[facility] immune support     pantoprazole (PROTONIX) 40 MG tablet Take 40 mg by mouth daily.     rosuvastatin (CRESTOR) 20 MG tablet Take 1 tablet (20 mg total) by mouth daily. 90 tablet 3   thyroid (ARMOUR THYROID) 60 MG tablet TAKE ONE TABLET BY MOUTH ON MONDAY, WEDNESDAY, THURSDAY, FRIDAY, AND  SUNDAY. 65 tablet 1   thyroid (ARMOUR THYROID) 90 MG tablet Take 1 tablet (90 mg total) by mouth once a week. On Tuesday 10 tablet 1   tiZANidine (ZANAFLEX) 2 MG tablet Take 0.5-2 tablets (1-4 mg total) by mouth every 6 (six) hours as needed for muscle spasms. 30 tablet 1   warfarin (COUMADIN) 4 MG tablet TAKE 1 TABLET BY MOUTH DAILY. ADJUST DOSE AS INSTRUCTED 90 tablet 3  No facility-administered medications prior to visit.   No Known Allergies Review of Systems  Neurological:        (+) Peripheral neuropathy in bilateral feet.      Objective:    Physical Exam Constitutional:      General: He is not in acute distress.    Appearance: Normal appearance. He is not ill-appearing.  HENT:     Head: Normocephalic and atraumatic.     Right Ear: External ear normal.     Left Ear: External ear normal.     Mouth/Throat:     Mouth: Mucous membranes are moist.     Pharynx: Oropharynx is clear.  Eyes:     Extraocular Movements: Extraocular movements intact.     Pupils: Pupils are equal, round, and reactive to light.  Cardiovascular:     Rate and Rhythm: Normal rate and regular rhythm.     Pulses:          Posterior tibial pulses are 1+ on the right side and 1+ on the left side.     Heart sounds: Normal heart sounds. No murmur heard.    No gallop.  Pulmonary:     Effort: Pulmonary effort is normal. No respiratory distress.     Breath sounds: Normal breath sounds. No wheezing or rales.  Abdominal:     General: Bowel sounds are normal.  Feet:     Right foot:     Protective Sensation: 10 sites tested.  5 sites sensed.     Skin integrity: Skin integrity normal.     Left foot:     Protective Sensation: 10 sites tested.  10 sites sensed.     Comments: The ball and tip of the right foot is cold.    Skin:    General: Skin is warm and dry.  Neurological:     Mental Status: He is alert and oriented to person, place, and time.  Psychiatric:        Mood and Affect: Mood normal.         Behavior: Behavior normal.        Judgment: Judgment normal.    There were no vitals taken for this visit. Wt Readings from Last 3 Encounters:  07/25/21 196 lb 9.6 oz (89.2 kg)  07/01/21 171 lb (77.6 kg)  06/18/21 168 lb 8 oz (76.4 kg)   Diabetic Foot Exam - Simple   No data filed    Lab Results  Component Value Date   WBC 7.4 07/25/2021   HGB 14.5 07/25/2021   HCT 43.5 07/25/2021   PLT 202.0 07/25/2021   GLUCOSE 92 07/25/2021   CHOL 201 (H) 07/25/2021   TRIG 173.0 (H) 07/25/2021   HDL 39.00 (L) 07/25/2021   LDLDIRECT 158.0 03/11/2013   LDLCALC 128 (H) 07/25/2021   ALT 26 07/25/2021   AST 26 07/25/2021   NA 142 07/25/2021   K 4.7 07/25/2021   CL 106 07/25/2021   CREATININE 1.15 07/25/2021   BUN 18 07/25/2021   CO2 28 07/25/2021   TSH 0.28 (L) 07/25/2021   PSA 4.29 (H) 06/20/2020   INR 2.2 09/07/2021   HGBA1C 6.8 (H) 07/25/2021   MICROALBUR 0.9 09/24/2020   Lab Results  Component Value Date   TSH 0.28 (L) 07/25/2021   Lab Results  Component Value Date   WBC 7.4 07/25/2021   HGB 14.5 07/25/2021   HCT 43.5 07/25/2021   MCV 88.4 07/25/2021   PLT 202.0 07/25/2021   Lab Results  Component  Value Date   NA 142 07/25/2021   K 4.7 07/25/2021   CHLORIDE 109 11/16/2012   CO2 28 07/25/2021   GLUCOSE 92 07/25/2021   BUN 18 07/25/2021   CREATININE 1.15 07/25/2021   BILITOT 0.7 07/25/2021   ALKPHOS 68 07/25/2021   AST 26 07/25/2021   ALT 26 07/25/2021   PROT 7.0 07/25/2021   ALBUMIN 4.3 07/25/2021   CALCIUM 9.2 07/25/2021   ANIONGAP 8 09/11/2014   GFR 61.64 07/25/2021   Lab Results  Component Value Date   CHOL 201 (H) 07/25/2021   Lab Results  Component Value Date   HDL 39.00 (L) 07/25/2021   Lab Results  Component Value Date   LDLCALC 128 (H) 07/25/2021   Lab Results  Component Value Date   TRIG 173.0 (H) 07/25/2021   Lab Results  Component Value Date   CHOLHDL 5 07/25/2021   Lab Results  Component Value Date   HGBA1C 6.8 (H) 07/25/2021       Assessment & Plan:   Problem List Items Addressed This Visit   None  No orders of the defined types were placed in this encounter.  I, Kellie Simmering, personally preformed the services described in this documentation.  All medical record entries made by the scribe were at my direction and in my presence.  I have reviewed the chart and discharge instructions (if applicable) and agree that the record reflects my personal performance and is accurate and complete. 12/17/2021  I,Mohammed Iqbal,acting as a scribe for Penni Homans, MD.,have documented all relevant documentation on the behalf of Penni Homans, MD,as directed by  Penni Homans, MD while in the presence of Penni Homans, MD.  Kellie Simmering

## 2021-12-17 NOTE — Patient Instructions (Signed)
Peripheral Neuropathy Peripheral neuropathy is a type of nerve damage. It affects nerves that carry signals between the spinal cord and the arms, legs, and the rest of the body (peripheral nerves). It does not affect nerves in the spinal cord or brain. In peripheral neuropathy, one nerve or a group of nerves may be damaged. Peripheral neuropathy is a broad category that includes many specific nerve disorders, like diabetic neuropathy, hereditary neuropathy, and carpal tunnel syndrome. What are the causes? This condition may be caused by: Certain diseases, such as: Diabetes. This is the most common cause of peripheral neuropathy. Autoimmune diseases, such as rheumatoid arthritis and systemic lupus erythematosus. Nerve diseases that are passed from parent to child (inherited). Kidney disease. Thyroid disease. Other causes may include: Nerve injury. Pressure or stress on a nerve that lasts a long time. Lack (deficiency) of B vitamins. This can result from alcoholism, poor diet, or a restricted diet. Infections. Some medicines, such as cancer medicines (chemotherapy). Poisonous (toxic) substances, such as lead and mercury. Too little blood flowing to the legs. In some cases, the cause of this condition is not known. What are the signs or symptoms? Symptoms of this condition depend on which of your nerves is damaged. Symptoms in the legs, hands, and arms can include: Loss of feeling (numbness) in the feet, hands, or both. Tingling in the feet, hands, or both. Burning pain. Very sensitive skin. Weakness. Not being able to move a part of the body (paralysis). Clumsiness or poor coordination. Muscle twitching. Loss of balance. Symptoms in other parts of the body can include: Not being able to control your bladder. Feeling dizzy. Sexual problems. How is this diagnosed? Diagnosing and finding the cause of peripheral neuropathy can be difficult. Your health care provider will take your  medical history and do a physical exam. A neurological exam will also be done. This involves checking things that are affected by your brain, spinal cord, and nerves (nervous system). For example, your health care provider will check your reflexes, how you move, and what you can feel. You may have other tests, such as: Blood tests. Electromyogram (EMG) and nerve conduction tests. These tests check nerve function and how well the nerves are controlling the muscles. Imaging tests, such as a CT scan or MRI, to rule out other causes of your symptoms. Removing a small piece of nerve to be examined in a lab (nerve biopsy). Removing and examining a small amount of the fluid that surrounds the brain and spinal cord (lumbar puncture). How is this treated? Treatment for this condition may involve: Treating the underlying cause of the neuropathy, such as diabetes, kidney disease, or vitamin deficiencies. Stopping medicines that can cause neuropathy, such as chemotherapy. Medicine to help relieve pain. Medicines may include: Prescription or over-the-counter pain medicine. Anti-seizure medicine. Antidepressants. Pain-relieving patches that are applied to painful areas of skin. Surgery to relieve pressure on a nerve or to destroy a nerve that is causing pain. Physical therapy to help improve movement and balance. Devices to help you move around (assistive devices). Follow these instructions at home: Medicines Take over-the-counter and prescription medicines only as told by your health care provider. Do not take any other medicines without first asking your health care provider. Ask your health care provider if the medicine prescribed to you requires you to avoid driving or using machinery. Lifestyle  Do not use any products that contain nicotine or tobacco. These products include cigarettes, chewing tobacco, and vaping devices, such as e-cigarettes. Smoking keeps   blood from reaching damaged nerves. If you  need help quitting, ask your health care provider. Avoid or limit alcohol. Too much alcohol can cause a vitamin B deficiency, and vitamin B is needed for healthy nerves. Eat a healthy diet. This includes: Eating foods that are high in fiber, such as beans, whole grains, and fresh fruits and vegetables. Limiting foods that are high in fat and processed sugars, such as fried or sweet foods. General instructions  If you have diabetes, work closely with your health care provider to keep your blood sugar under control. If you have numbness in your feet: Check every day for signs of injury or infection. Watch for redness, warmth, and swelling. Wear padded socks and comfortable shoes. These help protect your feet. Develop a good support system. Living with peripheral neuropathy can be stressful. Consider talking with a mental health specialist or joining a support group. Use assistive devices and attend physical therapy as told by your health care provider. This may include using a walker or a cane. Keep all follow-up visits. This is important. Where to find more information National Institute of Neurological Disorders: www.ninds.nih.gov Contact a health care provider if: You have new signs or symptoms of peripheral neuropathy. You are struggling emotionally from dealing with peripheral neuropathy. Your pain is not well controlled. Get help right away if: You have an injury or infection that is not healing normally. You develop new weakness in an arm or leg. You have fallen or do so frequently. Summary Peripheral neuropathy is when the nerves in the arms or legs are damaged, resulting in numbness, weakness, or pain. There are many causes of peripheral neuropathy, including diabetes, pinched nerves, vitamin deficiencies, autoimmune disease, and hereditary conditions. Diagnosing and finding the cause of peripheral neuropathy can be difficult. Your health care provider will take your medical  history, do a physical exam, and do tests, including blood tests and nerve function tests. Treatment involves treating the underlying cause of the neuropathy and taking medicines to help control pain. Physical therapy and assistive devices may also help. This information is not intended to replace advice given to you by your health care provider. Make sure you discuss any questions you have with your health care provider. Document Revised: 10/23/2020 Document Reviewed: 10/23/2020 Elsevier Patient Education  2023 [employer]  

## 2021-12-18 LAB — CBC
HCT: 44.1 % (ref 39.0–52.0)
Hemoglobin: 14.5 g/dL (ref 13.0–17.0)
MCHC: 32.9 g/dL (ref 30.0–36.0)
MCV: 89.4 fl (ref 78.0–100.0)
Platelets: 205 10*3/uL (ref 150.0–400.0)
RBC: 4.93 Mil/uL (ref 4.22–5.81)
RDW: 15.4 % (ref 11.5–15.5)
WBC: 6.6 10*3/uL (ref 4.0–10.5)

## 2021-12-18 LAB — HEMOGLOBIN A1C: Hgb A1c MFr Bld: 7.1 % — ABNORMAL HIGH (ref 4.6–6.5)

## 2021-12-18 LAB — COMPREHENSIVE METABOLIC PANEL WITH GFR
ALT: 28 U/L (ref 0–53)
AST: 19 U/L (ref 0–37)
Albumin: 4.1 g/dL (ref 3.5–5.2)
Alkaline Phosphatase: 72 U/L (ref 39–117)
BUN: 17 mg/dL (ref 6–23)
CO2: 29 meq/L (ref 19–32)
Calcium: 9.4 mg/dL (ref 8.4–10.5)
Chloride: 105 meq/L (ref 96–112)
Creatinine, Ser: 1.07 mg/dL (ref 0.40–1.50)
GFR: 67.02 mL/min
Glucose, Bld: 90 mg/dL (ref 70–99)
Potassium: 4.5 meq/L (ref 3.5–5.1)
Sodium: 140 meq/L (ref 135–145)
Total Bilirubin: 0.6 mg/dL (ref 0.2–1.2)
Total Protein: 6.8 g/dL (ref 6.0–8.3)

## 2021-12-18 LAB — LIPID PANEL
Cholesterol: 215 mg/dL — ABNORMAL HIGH (ref 0–200)
HDL: 41.7 mg/dL
NonHDL: 172.84
Total CHOL/HDL Ratio: 5
Triglycerides: 211 mg/dL — ABNORMAL HIGH (ref 0.0–149.0)
VLDL: 42.2 mg/dL — ABNORMAL HIGH (ref 0.0–40.0)

## 2021-12-18 LAB — TSH: TSH: 1.3 u[IU]/mL (ref 0.35–5.50)

## 2021-12-18 LAB — VITAMIN B12: Vitamin B-12: 374 pg/mL (ref 211–911)

## 2021-12-18 LAB — RPR: RPR Ser Ql: NONREACTIVE

## 2021-12-18 LAB — LDL CHOLESTEROL, DIRECT: Direct LDL: 156 mg/dL

## 2021-12-18 NOTE — Assessment & Plan Note (Signed)
Tolerating statin, encouraged heart healthy diet, avoid trans fats, minimize simple carbs and saturated fats. Increase exercise as tolerated 

## 2021-12-18 NOTE — Assessment & Plan Note (Signed)
Has struggled with some mild numbness in tips of toes in right foot for years and he believes it started after he had a blood clot in that leg. More recently the discomfort has intensified and the burning and prickly sensation in right foot is worsening and he is now noting the same symptoms in left foot and they have gotten bad enough that it is disrupting his sleep. It fluctuates in intensity but is persistently present. On monofilament exam he only has decreased sensation at the very tips of the toes on his right foot. Will try treating with Gabapentin 100-300 mg qhs, check labs and refer for vascular evaluation.

## 2021-12-18 NOTE — Assessment & Plan Note (Signed)
hgba1c acceptable, minimize simple carbs. Increase exercise as tolerated.  

## 2021-12-18 NOTE — Assessment & Plan Note (Signed)
Stable on Armour Thyroid 

## 2021-12-20 ENCOUNTER — Other Ambulatory Visit (HOSPITAL_BASED_OUTPATIENT_CLINIC_OR_DEPARTMENT_OTHER): Payer: Self-pay

## 2021-12-20 MED ORDER — COMIRNATY 30 MCG/0.3ML IM SUSY
PREFILLED_SYRINGE | INTRAMUSCULAR | 0 refills | Status: DC
  Filled 2021-12-20: qty 0.3, 1d supply, fill #0

## 2021-12-23 ENCOUNTER — Other Ambulatory Visit: Payer: Self-pay | Admitting: *Deleted

## 2021-12-23 DIAGNOSIS — I739 Peripheral vascular disease, unspecified: Secondary | ICD-10-CM

## 2021-12-30 ENCOUNTER — Ambulatory Visit (HOSPITAL_COMMUNITY)
Admission: RE | Admit: 2021-12-30 | Discharge: 2021-12-30 | Disposition: A | Discharge status: Home / Self Care | Payer: Medicare PPO | Source: Ambulatory Visit | Attending: Surgery | Admitting: Surgery

## 2021-12-30 ENCOUNTER — Encounter: Payer: Self-pay | Admitting: Surgery

## 2021-12-30 ENCOUNTER — Ambulatory Visit: Payer: Medicare PPO | Admitting: Surgery

## 2021-12-30 VITALS — BP 112/66 | HR 65 | Temp 98.0°F | Resp 20 | Ht 72.0 in | Wt 164.0 lb

## 2021-12-30 DIAGNOSIS — I70213 Atherosclerosis of native arteries of extremities with intermittent claudication, bilateral legs: Secondary | ICD-10-CM

## 2021-12-30 DIAGNOSIS — I739 Peripheral vascular disease, unspecified: Secondary | ICD-10-CM | POA: Insufficient documentation

## 2021-12-30 NOTE — Progress Notes (Signed)
[facility]ist [facility]  Patient name: Paul Ryan MRN: [id number] DOB: [birth year] Sex: male   REQUESTING PROVIDER:    Dr. Randel Pigg   REASON FOR CONSULT:    Possible PAD  HISTORY OF PRESENT ILLNESS:   Paul Ryan is a 76 y.o. male, who is referred for evaluation of peripheral vascular disease.  The patient has been experiencing burning and numbness on the bottoms of his feet, right greater than left.  He denies claudication symptoms.  He is not a diabetic.  He does not have any open wounds.  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  He is on Coumadin.  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[place]) 05/16/2011   Collagen vascular disease ([place])    Cough 12/01/2016   DVT (deep venous thrombosis) ([name])    takes coumadin   Dysuria 02/27/2013   Erectile dysfunction 12/28/2016   Hereditary protein S deficiency ([place]) 05/16/2011   Hyperglycemia 12/26/2014   Hyperlipidemia    Hypothyroid 06/10/2011   Insomnia 04/24/2016   Left hip pain 12/17/2010   MCI (mild cognitive impairment) 07/22/2016   Nasal lesion 07/22/2016   Polymyalgia ([place]) 03/21/2014   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   Preventative health care 12/17/2010   Shingles 1982   right abdominal wall   Staph skin infection 07/22/2016   Thyroid nodule 11/29/2014   Urinary hesitancy 09/30/2016   Vitamin D deficiency 10/14/2011     FAMILY HISTORY   Family History  Problem Relation Age of Onset   Cancer Mother        unknown- everywhere   Cancer Sister        breast    SOCIAL HISTORY:   Social History   Socioeconomic History   Marital status: Married    Spouse name: Not on file   Number of children: Not on file   Years of education: Not on file    Highest education level: Not on file  Occupational History   Not on file  Tobacco Use   Smoking status: Former    Packs/day: 1.00    Years: 20.00    Total pack years: 20.00    Types: Cigarettes    Quit date: 03/03/1980    Years since quitting: 41.8   Smokeless tobacco: Never  Substance and Sexual Activity   Alcohol use: No    Alcohol/week: 0.0 standard drinks of alcohol   Drug use: No   Sexual activity: Not on file  Other Topics Concern   Not on file  Social History Narrative   Not on file   Social Determinants of Health   Financial Resource Strain: Low Risk  (09/19/2020)   Overall Financial Resource Strain (CARDIA)    Difficulty of Paying Living Expenses: Not hard at all  Food Insecurity: No Food Insecurity (09/19/2020)   Hunger Vital Sign    Worried About Running Out of Food in the Last Year: Never true    Ran Out of Food in the Last Year: Never true  Transportation Needs: No Transportation Needs (09/19/2020)   PRAPARE - Hydrologist (Medical): No    Lack of Transportation (Non-Medical): No  Physical Activity: Sufficiently Active (09/19/2020)   Exercise Vital Sign    Days  of Exercise per Week: 7 days    Minutes of Exercise per Session: 50 min  Stress: No Stress Concern Present (09/19/2020)   [place]    Feeling of Stress : Not at all  Social Connections: Moderately Isolated (09/19/2020)   Social Connection and Isolation Panel [NHANES]    Frequency of Communication with Friends and Family: More than three times a week    Frequency of Social Gatherings with Friends and Family: More than three times a week    Attends Religious Services: Never    Marine scientist or Organizations: No    Attends Archivist Meetings: Never    Marital Status: Married  Human resources officer Violence: Not At Risk (09/19/2020)   Humiliation, Afraid, Rape, and Kick questionnaire    Fear of  Current or Ex-Partner: No    Emotionally Abused: No    Physically Abused: No    Sexually Abused: No    ALLERGIES:    No Known Allergies  CURRENT MEDICATIONS:    Current Outpatient Medications  Medication Sig Dispense Refill   Ascorbic Acid (VITAMIN C ER PO) Take by mouth daily.     Cholecalciferol 4000 UNITS TABS Take 1 tablet by mouth daily.     Coenzyme Q10 (COQ10 PO) Take 2 tablets by mouth daily.     DULoxetine (CYMBALTA) 30 MG capsule Take 30 mg by mouth 2 (two) times daily.     gabapentin (NEURONTIN) 100 MG capsule Take 1-3 capsules (100-300 mg total) by mouth at bedtime. 90 capsule 2   hydroquinone 4 % cream Apply topically 2 (two) times daily. 28.35 g 1   methylPREDNISolone (MEDROL DOSEPAK) 4 MG TBPK tablet 6 tabs po x 1 day then 5 tabs po x 1 day then 4 tabs po x 1 day then 3 tabs po x 1 day then 2 tabs po x 1 day then 1 tab po x 1 day and stop 21 tablet 0   NON FORMULARY AH[facility] immune support     pantoprazole (PROTONIX) 40 MG tablet Take 40 mg by mouth daily.     rosuvastatin (CRESTOR) 20 MG tablet Take 1 tablet (20 mg total) by mouth daily. 90 tablet 3   thyroid (ARMOUR THYROID) 60 MG tablet TAKE ONE TABLET BY MOUTH ON MONDAY, WEDNESDAY, THURSDAY, FRIDAY, AND SUNDAY. 65 tablet 1   tiZANidine (ZANAFLEX) 2 MG tablet Take 0.5-2 tablets (1-4 mg total) by mouth every 6 (six) hours as needed for muscle spasms. 30 tablet 1   warfarin (COUMADIN) 4 MG tablet TAKE 1 TABLET BY MOUTH DAILY. ADJUST DOSE AS INSTRUCTED 90 tablet 3   HYDROcodone bit-homatropine (HYDROMET) 5-1.5 MG/5ML syrup Take 2.5-5 mLs by mouth 2 (two) times daily as needed for cough. (Patient not taking: Reported on 12/30/2021) 120 mL 0   No current facility-administered medications for this visit.    REVIEW OF SYSTEMS:   '[X]'$  denotes positive finding, '[ ]'$  denotes negative finding Cardiac  Comments:  Chest pain or chest pressure:    Shortness of breath upon exertion:    Short of breath when lying flat:     Irregular heart rhythm:        Vascular    Pain in calf, thigh, or hip brought on by ambulation:    Pain in feet at night that wakes you up from your sleep:     Blood clot in your veins:    Leg swelling:  Pulmonary    Oxygen at home:    Productive cough:     Wheezing:         Neurologic    Sudden weakness in arms or legs:     Sudden numbness in arms or legs:     Sudden onset of difficulty speaking or slurred speech:    Temporary loss of vision in one eye:     Problems with dizziness:         Gastrointestinal    Blood in stool:      Vomited blood:         Genitourinary    Burning when urinating:     Blood in urine:        Psychiatric    Major depression:         Hematologic    Bleeding problems:    Problems with blood clotting too easily:        Skin    Rashes or ulcers:        Constitutional    Fever or chills:     PHYSICAL EXAM:   Vitals:   12/30/21 1337  BP: 112/66  Pulse: 65  Resp: 20  Temp: 98 F (36.7 C)  SpO2: 94%  Weight: 164 lb (74.4 kg)  Height: 6' (1.829 m)    GENERAL: The patient is a well-nourished male, in no acute distress. The vital signs are documented above. CARDIAC: There is a regular rate and rhythm.  VASCULAR: Palpable dorsalis pedis and posterior tibial pulses bilaterally PULMONARY: Nonlabored respirations MUSCULOSKELETAL: There are no major deformities or cyanosis. SKIN: There are no ulcers or rashes noted. PSYCHIATRIC: The patient has a normal affect.  STUDIES:   I have reviewed the following studies: +-------+-----------+-----------+------------+------------+  ABI/TBIToday's ABIToday's TBIPrevious ABIPrevious TBI  +-------+-----------+-----------+------------+------------+  Right  1.21       0.87                                 +-------+-----------+-----------+------------+------------+  Left   1.16       0.80                                  +-------+-----------+-----------+------------+------------+  Right toe pressure: 101 Left toe pressure: 93 All waveforms are triphasic  ASSESSMENT and PLAN   Lower extremity peripheral neuropathy: Vascular assessment is within the normal range.  His ABIs and toe pressures are normal.  He has palpable pedal pulses.  I discussed with the patient that I feel his symptoms are related to his peripheral neuropathy and not vascular insufficiency.  He should get relief from gabapentin.   Leia Alf, MD, FACS [facility] [phone] Pager [phone]

## 2021-12-31 ENCOUNTER — Other Ambulatory Visit: Payer: Self-pay | Admitting: Family Medicine

## 2021-12-31 ENCOUNTER — Encounter: Payer: Self-pay | Admitting: Family Medicine

## 2021-12-31 MED ORDER — PREDNISONE 1 MG PO TABS: 1.0000 mg | ORAL_TABLET | Freq: Every day | ORAL | 2 refills | Status: DC

## 2022-01-15 DIAGNOSIS — M5030 Other cervical disc degeneration, unspecified cervical region: Secondary | ICD-10-CM | POA: Diagnosis not present

## 2022-01-15 DIAGNOSIS — M5136 Other intervertebral disc degeneration, lumbar region: Secondary | ICD-10-CM | POA: Diagnosis not present

## 2022-01-16 ENCOUNTER — Encounter: Payer: Self-pay | Admitting: Family Medicine

## 2022-01-16 ENCOUNTER — Telehealth: Payer: Self-pay | Admitting: Gastroenterology

## 2022-01-16 NOTE — Telephone Encounter (Signed)
Chart reviewed.  Appears to be consult will be more for evaluation of reflux symptoms and regurgitation.  He was seen over at [facility] earlier this year.  Initial consult was 04/30/2021 for evaluation of reflux and regurgitation.  Per notes, he has a prior known history of GERD with erosive esophagitis approximately 1 year prior at a previous facility.  Was also evaluated in February for change in bowel habits, constipation.  Most recent colonoscopy was 5 years prior and reportedly normal per notes.  He was further evaluated with EGD/colonoscopy on 07/26/2021 at [facility]. EGD notable for 5 cm hiatal hernia, Hill grade 4 valve, but otherwise normal esophagus, stomach, duodenum.  Was recommended to continue PPI and follow-up in the GI clinic.  Colonoscopy was notable for sigmoid diverticulosis, internal hemorrhoids, and otherwise normal with no further colonoscopies recommended.  Since he was seen earlier this year for what seems to be the same issue, I strongly recommend he call Dr. Derrek Monaco at [facility] to get a follow-up appointment for ongoing care of his reflux. Otherwise, is there a reason I am missing that he is requesting transfer of care to me for something that was being actively worked up already?

## 2022-01-16 NOTE — Telephone Encounter (Signed)
Good Afternoon Dr Bryan Lemma  We have received a request from patient to transfer care from [facility] over to you .   [facility] was last seen with [facility] on 07/26/21. Patient has a referral in for Adenomatous polyp of colon, unspecified part of colon . Records are available for review in epics . Please review and advise on scheduling.

## 2022-01-17 NOTE — Telephone Encounter (Signed)
Left patient a detailed msg with recommendations.

## 2022-01-20 DIAGNOSIS — L820 Inflamed seborrheic keratosis: Secondary | ICD-10-CM | POA: Diagnosis not present

## 2022-01-20 DIAGNOSIS — L814 Other melanin hyperpigmentation: Secondary | ICD-10-CM | POA: Diagnosis not present

## 2022-01-20 DIAGNOSIS — L538 Other specified erythematous conditions: Secondary | ICD-10-CM | POA: Diagnosis not present

## 2022-01-20 DIAGNOSIS — Z08 Encounter for follow-up examination after completed treatment for malignant neoplasm: Secondary | ICD-10-CM | POA: Diagnosis not present

## 2022-01-20 DIAGNOSIS — Z85828 Personal history of other malignant neoplasm of skin: Secondary | ICD-10-CM | POA: Diagnosis not present

## 2022-01-20 DIAGNOSIS — L570 Actinic keratosis: Secondary | ICD-10-CM | POA: Diagnosis not present

## 2022-01-20 DIAGNOSIS — L821 Other seborrheic keratosis: Secondary | ICD-10-CM | POA: Diagnosis not present

## 2022-01-20 DIAGNOSIS — D225 Melanocytic nevi of trunk: Secondary | ICD-10-CM | POA: Diagnosis not present

## 2022-01-27 DIAGNOSIS — Z7901 Long term (current) use of anticoagulants: Secondary | ICD-10-CM | POA: Diagnosis not present

## 2022-01-27 DIAGNOSIS — I2699 Other pulmonary embolism without acute cor pulmonale: Secondary | ICD-10-CM | POA: Diagnosis not present

## 2022-01-27 DIAGNOSIS — D6859 Other primary thrombophilia: Secondary | ICD-10-CM | POA: Diagnosis not present

## 2022-01-29 NOTE — Telephone Encounter (Signed)
Good Morning Dr.Cirigliano,  Patient called to follow up with his transfer of care. He was made aware of your decision to schedule further appointments with [facility]. He states he is requesting this transfer as him and his pcp discussed him seeing a different GI based on the opinions of care he received at [facility]. He states all his doctors are under [name] and he prefers to keep it that way as he was sent to [facility] by default. I made patient aware that we have a high demand of patients without established GI providers. The patient respected the decision that was made but wanted to make one last attempt to stay under [place].

## 2022-02-07 ENCOUNTER — Telehealth: Payer: Self-pay | Admitting: *Deleted

## 2022-02-07 NOTE — Telephone Encounter (Signed)
LMOM for pt to return call to schedule AWV.

## 2022-02-09 ENCOUNTER — Other Ambulatory Visit: Payer: Self-pay | Admitting: Family Medicine

## 2022-02-11 NOTE — Telephone Encounter (Signed)
Patient has an appointment with [facility] on 04/08/2022.  Is he still hoping to transfer care here?  If wanting to transfer his care here, I strongly recommend sticking with only 1 Gastroenterologist.  If that sounds reasonable to him, can schedule OV for transfer of care to our office.  If his preference is to go to [place], that is perfectly reasonable as well.

## 2022-02-12 ENCOUNTER — Telehealth: Payer: Self-pay | Admitting: Family Medicine

## 2022-02-12 NOTE — Telephone Encounter (Signed)
Called patient to ask if he was going to keep his appt with [facility] or if he wanted to schedule with [place]. LVM

## 2022-02-12 NOTE — Telephone Encounter (Signed)
Copied from [name] [phone]. Topic: Medicare AWV >> Feb 12, 2022 12:35 PM Gillis Santa wrote: Reason for CRM: LVM PATIENT TO CALL [phone] TO SCHEDULE AWVS [place]

## 2022-02-14 DIAGNOSIS — M542 Cervicalgia: Secondary | ICD-10-CM | POA: Diagnosis not present

## 2022-02-15 ENCOUNTER — Other Ambulatory Visit: Payer: Self-pay | Admitting: Oncology

## 2022-02-15 ENCOUNTER — Other Ambulatory Visit: Payer: Self-pay | Admitting: Family Medicine

## 2022-02-15 DIAGNOSIS — I82509 Chronic embolism and thrombosis of unspecified deep veins of unspecified lower extremity: Secondary | ICD-10-CM

## 2022-02-19 ENCOUNTER — Ambulatory Visit (INDEPENDENT_AMBULATORY_CARE_PROVIDER_SITE_OTHER): Payer: Medicare PPO

## 2022-02-19 VITALS — Wt 164.0 lb

## 2022-02-19 DIAGNOSIS — Z0000 Encounter for general adult medical examination without abnormal findings: Secondary | ICD-10-CM | POA: Diagnosis not present

## 2022-02-19 NOTE — Progress Notes (Signed)
I connected with  Unknown Foley on 02/19/22 by a audio enabled telemedicine application and verified that I am speaking with the correct person using two identifiers.  Patient Location: Home  Provider Location: Home Office  I discussed the limitations of evaluation and management by telemedicine. The patient expressed understanding and agreed to proceed.   Subjective:   Yurem Viner is a 76 y.o. male who presents for Medicare Annual/Subsequent preventive examination.  Review of Systems     Cardiac Risk Factors include: advanced age (>34mn, >>65women);dyslipidemia;male gender     Objective:    Today's Vitals   02/19/22 1403  Weight: 164 lb (74.4 kg)   Body mass index is 22.24 kg/m.     02/19/2022    2:08 PM 09/19/2020   10:24 AM 03/07/2020    8:19 AM 07/28/2019    9:33 AM 06/20/2019   11:50 AM 06/22/2018   11:11 AM 02/01/2018    2:48 PM  Advanced Directives  Does Patient Have a Medical Advance Directive? Yes Yes Yes Yes No No Yes  Type of AParamedicof ACross RoadsLiving will HCold SpringLiving will HOsceola [place]Living will H[place]Living will   [place] will  Does patient want to make changes to medical advance directive?   No - Patient declined No - Patient declined     Copy of [place] Chart? No - copy requested No - copy requested No - copy requested No - copy requested   No - copy requested  Would patient like information on creating a medical advance directive?     No - Patient declined No - Patient declined     Current Medications (verified) Outpatient Encounter Medications as of 02/19/2022  Medication Sig   Ascorbic Acid (VITAMIN C ER PO) Take by mouth daily.   Cholecalciferol 4000 UNITS TABS Take 1 tablet by mouth daily.   DULoxetine (CYMBALTA) 30 MG capsule Take 30 mg by mouth 2 (two) times daily.   gabapentin (NEURONTIN) 100 MG capsule Take 1-3  capsules (100-300 mg total) by mouth at bedtime.   NON FORMULARY AHCC immune support   pantoprazole (PROTONIX) 40 MG tablet Take 40 mg by mouth daily.   predniSONE (DELTASONE) 1 MG tablet Take 1 tablet (1 mg total) by mouth daily with breakfast.   rosuvastatin (CRESTOR) 20 MG tablet Take 1 tablet (20 mg total) by mouth daily.   thyroid (ARMOUR THYROID) 60 MG tablet TAKE ONE TABLET BY MOUTH ON MONDAY, WEDNESDAY, THURSDAY, FRIDAY, AND SUNDAY.   warfarin (COUMADIN) 4 MG tablet TAKE 1 TABLET BY MOUTH EVERY DAY   [DISCONTINUED] Coenzyme Q10 (COQ10 PO) Take 2 tablets by mouth daily.   [DISCONTINUED] HYDROcodone bit-homatropine (HYDROMET) 5-1.5 MG/5ML syrup Take 2.5-5 mLs by mouth 2 (two) times daily as needed for cough. (Patient not taking: Reported on 12/30/2021)   [DISCONTINUED] hydroquinone 4 % cream Apply topically 2 (two) times daily.   [DISCONTINUED] tiZANidine (ZANAFLEX) 2 MG tablet Take 0.5-2 tablets (1-4 mg total) by mouth every 6 (six) hours as needed for muscle spasms.   No facility-administered encounter medications on file as of 02/19/2022.    Allergies (verified) Patient has no known allergies.   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   Hyperglycemia 12/26/2014   Hyperlipidemia    Hypothyroid 06/10/2011   Insomnia 04/24/2016   Left hip pain 12/17/2010   MCI (mild cognitive impairment) 07/22/2016   Nasal lesion 07/22/2016   Polymyalgia ([place]) 03/21/2014   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   Preventative health care 12/17/2010   Shingles 1982   right abdominal wall   Staph skin infection 07/22/2016   Thyroid nodule  11/29/2014   Urinary hesitancy 09/30/2016   Vitamin D deficiency 10/14/2011   Past Surgical History:  Procedure Laterality Date   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   46 surgeries   Family History  Problem Relation Age of Onset   Cancer Mother        unknown- everywhere   Cancer Sister        breast   Social History   Socioeconomic History   Marital status: Married    Spouse name: Not on file   Number of children: Not on file   Years of education: Not on file   Highest education level: Not on file  Occupational History   Not on file  Tobacco Use   Smoking status: Former    Packs/day: 1.00    Years: 20.00    Total pack years: 20.00    Types: Cigarettes    Quit date: 03/03/1980    Years since quitting: 41.9   Smokeless tobacco: Never  Substance and Sexual Activity   Alcohol use: No    Alcohol/week: 0.0 standard drinks of alcohol   Drug use: No   Sexual activity: Not on file  Other Topics Concern   Not on file  Social History Narrative   Not on file   Social Determinants of Health   Financial Resource Strain: Low Risk  (02/19/2022)   Overall Financial Resource Strain (CARDIA)    Difficulty of Paying Living Expenses: Not hard at all  Food Insecurity: No Food Insecurity (02/19/2022)   Hunger Vital Sign    Worried About Running Out of Food in the Last Year: Never true    Ran Out of Food in the Last Year: Never true  Transportation Needs: No Transportation Needs (02/19/2022)   PRAPARE - Hydrologist (Medical): No    Lack of Transportation (Non-Medical): No  Physical Activity: Sufficiently Active (02/19/2022)   Exercise Vital Sign    Days of Exercise per Week: 7 days    Minutes of Exercise per Session: 50 min  Stress: No Stress Concern Present (02/19/2022)   [place]    Feeling of Stress : Not at all  Social Connections: Moderately Isolated (02/19/2022)   Social  Connection and Isolation Panel [NHANES]    Frequency of Communication with Friends and Family: More than three times a week    Frequency of Social Gatherings with Friends and Family: More than three times a week    Attends Religious Services: Never    Marine scientist or Organizations: No    Attends Music therapist: Never    Marital Status: Married    Tobacco Counseling Counseling given: Not Answered   Clinical Intake:  Pre-visit preparation completed: Yes  Pain : No/denies pain     BMI - recorded: 22.24 Nutritional Status: BMI of 19-24  Normal Nutritional Risks:  None Diabetes: No  How often do you need to have someone help you when you read instructions, pamphlets, or other written materials from your doctor or pharmacy?: 1 - Never  Diabetic?no  Interpreter Needed?: No  Information entered by :: Charlott Rakes, LPN   Activities of Daily Living    02/19/2022    2:09 PM 02/18/2022    5:39 PM  In your present state of health, do you have any difficulty performing the following activities:  Hearing? 0 0  Vision? 0 0  Difficulty concentrating or making decisions? 0 0  Walking or climbing stairs? 0 0  Dressing or bathing? 0 0  Doing errands, shopping? 0 0  Preparing Food and eating ? N N  Using the Toilet? N N  In the past six months, have you accidently leaked urine? N N  Do you have problems with loss of bowel control? N N  Managing your Medications? N N  Managing your Finances? N N  Housekeeping or managing your Housekeeping? N N    Patient Care Team: [name], MD as PCP - General (Family Medicine) Jerline Pain, MD as PCP - Cardiology (Cardiology) Roena Malady, MD (Ophthalmology) Forde Dandy, PharmD (Inactive) as Pharmacist (Pharmacist) Trula Slade, DPM as Consulting Physician (Podiatry)  Indicate any recent Medical Services you may have received from other than [facility] providers in the past year (date may be  approximate).     Assessment:   This is a routine wellness examination for [place].  Hearing/Vision screen Hearing Screening - Comments:: Pt denies any hearing issues  Vision Screening - Comments:: Pt follows up with Dr Percell Miller for annul eye exams   Dietary issues and exercise activities discussed: Current Exercise Habits: Home exercise routine, Type of exercise: walking, Time (Minutes): 50, Frequency (Times/Week): 6, Weekly Exercise (Minutes/Week): 300   Goals Addressed             This Visit's Progress    Patient Stated       Stay active and healthy       Depression Screen    02/19/2022    2:07 PM 12/17/2021    1:33 PM 07/25/2021    2:06 PM 09/24/2020   10:52 AM 09/19/2020   10:26 AM 06/20/2020   12:29 PM 07/28/2019    9:42 AM  PHQ 2/9 Scores  PHQ - 2 Score 0 0 0 0 0 0 0  PHQ- 9 Score  0         Fall Risk    02/19/2022    2:09 PM 02/18/2022    5:39 PM 12/17/2021    1:33 PM 07/25/2021    2:06 PM 09/24/2020   10:52 AM  Fall Risk   Falls in the past year? 0 0 1 0 0  Number falls in past yr: 0  0 0 0  Injury with Fall? 0 0 0 0 0  Risk for fall due to : Impaired vision   No Fall Risks No Fall Risks  Follow up Falls prevention discussed  Falls evaluation completed Falls evaluation completed Falls evaluation completed    [place]:  Any stairs in or around the home? No  If so, are there any without handrails? No  Home free of loose throw rugs in walkways, pet beds, electrical cords, etc? Yes  Adequate lighting in your home to reduce risk of falls? Yes   ASSISTIVE DEVICES UTILIZED TO PREVENT FALLS:  Life alert? No  Use of a cane, walker or w/c? No  Grab bars in the bathroom? No  Shower chair or bench in shower? Yes  Elevated toilet seat or a handicapped toilet? No   TIMED UP AND GO:  Was the test performed? No .   Cognitive Function:      09/21/2017    9:00 AM  Montreal Cognitive Assessment   Visuospatial/ Executive  (0/5) 5  Naming (0/3) 3  Attention: Read list of digits (0/2) 2  Attention: Read list of letters (0/1) 1  Attention: Serial 7 subtraction starting at 100 (0/3) 2  Language: Repeat phrase (0/2) 2  Language : Fluency (0/1) 1  Abstraction (0/2) 2  Delayed Recall (0/5) 4  Orientation (0/6) 6  Total 28      02/19/2022    2:10 PM  6CIT Screen  What Year? 0 points  What month? 0 points  What time? 0 points  Count back from 20 0 points  Months in reverse 0 points  Repeat phrase 0 points  Total Score 0 points    Immunizations Immunization History  Administered Date(s) Administered   COVID-19, mRNA, vaccine(Comirnaty)12 years and older 12/20/2021   Fluad Quad(high Dose 65+) 11/15/2021   Hepatitis A 11/09/1998   IPV 11/09/1998   Influenza Split 11/22/2015   Influenza Whole 12/13/2012   Influenza, High Dose Seasonal PF 10/31/2016, 11/01/2018, 11/09/2019   Influenza,inj,Quad PF,6+ Mos 10/25/2013, 12/26/2014   Influenza-Unspecified 12/02/2011, 12/02/2017, 12/02/2017   PFIZER(Purple Top)SARS-COV-2 Vaccination 04/08/2019, 05/02/2019, 12/13/2019   PPD Test 03/23/2015   Pneumococcal Conjugate-13 10/25/2013   Pneumococcal Polysaccharide-23 06/19/2009, 12/26/2014   Respiratory Syncytial Virus Vaccine,Recomb Aduvanted(Arexvy) 11/15/2021   Td 03/03/1990, 07/01/2001   Td (Adult), 2 Lf Tetanus Toxid, Preservative Free 03/03/1990, 07/01/2001   Tdap 12/17/2010   Typhoid Inactivated 11/09/1998, 03/09/2001   Zoster Recombinat (Shingrix) 11/03/2018, 05/19/2019   Zoster, Live 08/01/2008    TDAP status: Due, Education has been provided regarding the importance of this vaccine. Advised may receive this vaccine at local pharmacy or Health Dept. Aware to provide a copy of the vaccination record if obtained from local pharmacy or Health Dept. Verbalized acceptance and understanding.  Flu Vaccine status: Up to date  Pneumococcal vaccine status: Up to date  Covid-19 vaccine status: Completed  vaccines  Qualifies for Shingles Vaccine? Yes   Zostavax completed Yes   Shingrix Completed?: Yes  Screening Tests Health Maintenance  Topic Date Due   DTaP/Tdap/Td (5 - Td or Tdap) 12/16/2020   FOOT EXAM  09/21/2021   Diabetic kidney evaluation - Urine ACR  09/24/2021   COVID-19 Vaccine (5 - 2023-24 season) 02/14/2022   OPHTHALMOLOGY EXAM  05/02/2022   HEMOGLOBIN A1C  06/18/2022   Diabetic kidney evaluation - eGFR measurement  12/18/2022   Medicare Annual Wellness (AWV)  02/20/2023   Pneumonia Vaccine 70+ Years old  Completed   INFLUENZA VACCINE  Completed   Hepatitis C Screening  Completed   Zoster Vaccines- Shingrix  Completed   HPV VACCINES  Aged Out   COLONOSCOPY (Pts 45-49yr Insurance coverage will need to be confirmed)  Discontinued    Health Maintenance  Health Maintenance Due  Topic Date Due   DTaP/Tdap/Td (5 - Td or Tdap) 12/16/2020   FOOT EXAM  09/21/2021   Diabetic kidney evaluation - Urine ACR  09/24/2021   COVID-19 Vaccine (5 - 2023-24 season) 02/14/2022    Colorectal cancer screening: No longer required.    Additional Screening:  Hepatitis C Screening: Completed 03/30/15  Vision Screening: Recommended annual  ophthalmology exams for early detection of glaucoma and other disorders of the eye. Is the patient up to date with their annual eye exam?  Yes  Who is the provider or what is the name of the office in which the patient attends annual eye exams? Dr Percell Miller  If pt is not established with a provider, would they like to be referred to a provider to establish care? No .   Dental Screening: Recommended annual dental exams for proper oral hygiene  Community Resource Referral / Chronic Care Management: CRR required this visit?  No   CCM required this visit?  No      Plan:     I have personally reviewed and noted the following in the patient's chart:   Medical and social history Use of alcohol, tobacco or illicit drugs  Current medications and  supplements including opioid prescriptions. Patient is not currently taking opioid prescriptions. Functional ability and status Nutritional status Physical activity Advanced directives List of other physicians Hospitalizations, surgeries, and ER visits in previous 12 months Vitals Screenings to include cognitive, depression, and falls Referrals and appointments  In addition, I have reviewed and discussed with patient certain preventive protocols, quality metrics, and best practice recommendations. A written personalized care plan for preventive services as well as general preventive health recommendations were provided to patient.     Willette Brace, LPN   [id number]   Nurse Notes: none

## 2022-02-19 NOTE — Patient Instructions (Signed)
Paul Ryan , Thank you for taking time to come for your Medicare Wellness Visit. I appreciate your ongoing commitment to your health goals. Please review the following plan we discussed and let me know if I can assist you in the future.   These are the goals we discussed:  Goals      Continue to walk daily and eat a healthy diet     Patient Stated     Stay active and healthy        This is a list of the screening recommended for you and due dates:  Health Maintenance  Topic Date Due   DTaP/Tdap/Td vaccine (5 - Td or Tdap) 12/16/2020   Complete foot exam   09/21/2021   Yearly kidney health urinalysis for diabetes  09/24/2021   COVID-19 Vaccine (5 - 2023-24 season) 02/14/2022   Eye exam for diabetics  05/02/2022   Hemoglobin A1C  06/18/2022   Yearly kidney function blood test for diabetes  12/18/2022   Medicare Annual Wellness Visit  02/20/2023   Pneumonia Vaccine  Completed   Flu Shot  Completed   Hepatitis C Screening: USPSTF Recommendation to screen - Ages 18-79 yo.  Completed   Zoster (Shingles) Vaccine  Completed   HPV Vaccine  Aged Out   Colon Cancer Screening  Discontinued    Advanced directives: Please bring a copy of your health care power of attorney and living will to the office at your convenience.  Conditions/risks identified: stay active and healthy  Next appointment: Follow up in one year for your annual wellness visit.   Preventive Care 77 Years and Older, Male  Preventive care refers to lifestyle choices and visits with your health care provider that can promote health and wellness. What does preventive care include? A yearly physical exam. This is also called an annual well check. Dental exams once or twice a year. Routine eye exams. Ask your health care provider how often you should have your eyes checked. Personal lifestyle choices, including: Daily care of your teeth and gums. Regular physical activity. Eating a healthy diet. Avoiding tobacco and drug  use. Limiting alcohol use. Practicing safe sex. Taking low doses of aspirin every day. Taking vitamin and mineral supplements as recommended by your health care provider. What happens during an annual well check? The services and screenings done by your health care provider during your annual well check will depend on your age, overall health, lifestyle risk factors, and family history of disease. Counseling  Your health care provider may ask you questions about your: Alcohol use. Tobacco use. Drug use. Emotional well-being. Home and relationship well-being. Sexual activity. Eating habits. History of falls. Memory and ability to understand (cognition). Work and work Statistician. Screening  You may have the following tests or measurements: Height, weight, and BMI. Blood pressure. Lipid and cholesterol levels. These may be checked every 5 years, or more frequently if you are over 63 years old. Skin check. Lung cancer screening. You may have this screening every year starting at age 6 if you have a 30-pack-year history of smoking and currently smoke or have quit within the past 15 years. Fecal occult blood test (FOBT) of the stool. You may have this test every year starting at age 36. Flexible sigmoidoscopy or colonoscopy. You may have a sigmoidoscopy every 5 years or a colonoscopy every 10 years starting at age 2. Prostate cancer screening. Recommendations will vary depending on your family history and other risks. Hepatitis C blood test. Hepatitis B  blood test. Sexually transmitted disease (STD) testing. Diabetes screening. This is done by checking your blood sugar (glucose) after you have not eaten for a while (fasting). You may have this done every 1-3 years. Abdominal aortic aneurysm (AAA) screening. You may need this if you are a current or former smoker. Osteoporosis. You may be screened starting at age 73 if you are at high risk. Talk with your health care provider about  your test results, treatment options, and if necessary, the need for more tests. Vaccines  Your health care provider may recommend certain vaccines, such as: Influenza vaccine. This is recommended every year. Tetanus, diphtheria, and acellular pertussis (Tdap, Td) vaccine. You may need a Td booster every 10 years. Zoster vaccine. You may need this after age 54. Pneumococcal 13-valent conjugate (PCV13) vaccine. One dose is recommended after age 14. Pneumococcal polysaccharide (PPSV23) vaccine. One dose is recommended after age 45. Talk to your health care provider about which screenings and vaccines you need and how often you need them. This information is not intended to replace advice given to you by your health care provider. Make sure you discuss any questions you have with your health care provider. Document Released: 03/16/2015 Document Revised: 11/07/2015 Document Reviewed: 12/19/2014 Elsevier Interactive Patient Education  2017 [place] Prevention in the Home Falls can cause injuries. They can happen to people of all ages. There are many things you can do to make your home safe and to help prevent falls. What can I do on the outside of my home? Regularly fix the edges of walkways and driveways and fix any cracks. Remove anything that might make you trip as you walk through a door, such as a raised step or threshold. Trim any bushes or trees on the path to your home. Use bright outdoor lighting. Clear any walking paths of anything that might make someone trip, such as rocks or tools. Regularly check to see if handrails are loose or broken. Make sure that both sides of any steps have handrails. Any raised decks and porches should have guardrails on the edges. Have any leaves, snow, or ice cleared regularly. Use sand or salt on walking paths during winter. Clean up any spills in your garage right away. This includes oil or grease spills. What can I do in the bathroom? Use  night lights. Install grab bars by the toilet and in the tub and shower. Do not use towel bars as grab bars. Use non-skid mats or decals in the tub or shower. If you need to sit down in the shower, use a plastic, non-slip stool. Keep the floor dry. Clean up any water that spills on the floor as soon as it happens. Remove soap buildup in the tub or shower regularly. Attach bath mats securely with double-sided non-slip rug tape. Do not have throw rugs and other things on the floor that can make you trip. What can I do in the bedroom? Use night lights. Make sure that you have a light by your bed that is easy to reach. Do not use any sheets or blankets that are too big for your bed. They should not hang down onto the floor. Have a firm chair that has side arms. You can use this for support while you get dressed. Do not have throw rugs and other things on the floor that can make you trip. What can I do in the kitchen? Clean up any spills right away. Avoid walking on wet floors. Keep items  that you use a lot in easy-to-reach places. If you need to reach something above you, use a strong step stool that has a grab bar. Keep electrical cords out of the way. Do not use floor polish or wax that makes floors slippery. If you must use wax, use non-skid floor wax. Do not have throw rugs and other things on the floor that can make you trip. What can I do with my stairs? Do not leave any items on the stairs. Make sure that there are handrails on both sides of the stairs and use them. Fix handrails that are broken or loose. Make sure that handrails are as long as the stairways. Check any carpeting to make sure that it is firmly attached to the stairs. Fix any carpet that is loose or worn. Avoid having throw rugs at the top or bottom of the stairs. If you do have throw rugs, attach them to the floor with carpet tape. Make sure that you have a light switch at the top of the stairs and the bottom of the  stairs. If you do not have them, ask someone to add them for you. What else can I do to help prevent falls? Wear shoes that: Do not have high heels. Have rubber bottoms. Are comfortable and fit you well. Are closed at the toe. Do not wear sandals. If you use a stepladder: Make sure that it is fully opened. Do not climb a closed stepladder. Make sure that both sides of the stepladder are locked into place. Ask someone to hold it for you, if possible. Clearly mark and make sure that you can see: Any grab bars or handrails. First and last steps. Where the edge of each step is. Use tools that help you move around (mobility aids) if they are needed. These include: Canes. Walkers. Scooters. Crutches. Turn on the lights when you go into a dark area. Replace any light bulbs as soon as they burn out. Set up your furniture so you have a clear path. Avoid moving your furniture around. If any of your floors are uneven, fix them. If there are any pets around you, be aware of where they are. Review your medicines with your doctor. Some medicines can make you feel dizzy. This can increase your chance of falling. Ask your doctor what other things that you can do to help prevent falls. This information is not intended to replace advice given to you by your health care provider. Make sure you discuss any questions you have with your health care provider. Document Released: 12/14/2008 Document Revised: 07/26/2015 Document Reviewed: 03/24/2014 Elsevier Interactive Patient Education  2017 [employer].

## 2022-02-22 ENCOUNTER — Encounter: Payer: Self-pay | Admitting: Oncology

## 2022-03-11 ENCOUNTER — Other Ambulatory Visit: Payer: Self-pay | Admitting: Oncology

## 2022-03-11 DIAGNOSIS — I82509 Chronic embolism and thrombosis of unspecified deep veins of unspecified lower extremity: Secondary | ICD-10-CM

## 2022-03-14 DIAGNOSIS — M542 Cervicalgia: Secondary | ICD-10-CM | POA: Diagnosis not present

## 2022-03-17 DIAGNOSIS — G250 Essential tremor: Secondary | ICD-10-CM | POA: Diagnosis not present

## 2022-03-17 DIAGNOSIS — Z79899 Other long term (current) drug therapy: Secondary | ICD-10-CM | POA: Diagnosis not present

## 2022-03-17 DIAGNOSIS — G609 Hereditary and idiopathic neuropathy, unspecified: Secondary | ICD-10-CM | POA: Diagnosis not present

## 2022-03-17 DIAGNOSIS — G3184 Mild cognitive impairment, so stated: Secondary | ICD-10-CM | POA: Diagnosis not present

## 2022-04-10 ENCOUNTER — Encounter: Payer: Self-pay | Admitting: Family Medicine

## 2022-04-10 ENCOUNTER — Encounter: Payer: Self-pay | Admitting: Oncology

## 2022-04-10 DIAGNOSIS — E785 Hyperlipidemia, unspecified: Secondary | ICD-10-CM

## 2022-04-10 DIAGNOSIS — R739 Hyperglycemia, unspecified: Secondary | ICD-10-CM

## 2022-04-10 DIAGNOSIS — G6289 Other specified polyneuropathies: Secondary | ICD-10-CM

## 2022-04-11 ENCOUNTER — Other Ambulatory Visit: Payer: Self-pay

## 2022-04-11 ENCOUNTER — Other Ambulatory Visit: Payer: Self-pay | Admitting: Family Medicine

## 2022-04-11 DIAGNOSIS — M542 Cervicalgia: Secondary | ICD-10-CM | POA: Diagnosis not present

## 2022-04-11 NOTE — Telephone Encounter (Signed)
Labs ordered.

## 2022-04-13 ENCOUNTER — Encounter: Payer: Self-pay | Admitting: Oncology

## 2022-04-13 DIAGNOSIS — Z7901 Long term (current) use of anticoagulants: Secondary | ICD-10-CM | POA: Diagnosis not present

## 2022-04-13 DIAGNOSIS — I2699 Other pulmonary embolism without acute cor pulmonale: Secondary | ICD-10-CM | POA: Diagnosis not present

## 2022-04-13 DIAGNOSIS — D6859 Other primary thrombophilia: Secondary | ICD-10-CM | POA: Diagnosis not present

## 2022-04-14 ENCOUNTER — Other Ambulatory Visit (INDEPENDENT_AMBULATORY_CARE_PROVIDER_SITE_OTHER): Payer: Medicare PPO

## 2022-04-14 DIAGNOSIS — E785 Hyperlipidemia, unspecified: Secondary | ICD-10-CM

## 2022-04-14 DIAGNOSIS — G6289 Other specified polyneuropathies: Secondary | ICD-10-CM | POA: Diagnosis not present

## 2022-04-14 DIAGNOSIS — R739 Hyperglycemia, unspecified: Secondary | ICD-10-CM

## 2022-04-14 LAB — COMPREHENSIVE METABOLIC PANEL
ALT: 31 U/L (ref 0–53)
AST: 28 U/L (ref 0–37)
Albumin: 4.1 g/dL (ref 3.5–5.2)
Alkaline Phosphatase: 61 U/L (ref 39–117)
BUN: 18 mg/dL (ref 6–23)
CO2: 25 mEq/L (ref 19–32)
Calcium: 8.9 mg/dL (ref 8.4–10.5)
Chloride: 106 mEq/L (ref 96–112)
Creatinine, Ser: 1.12 mg/dL (ref 0.40–1.50)
GFR: 63.31 mL/min (ref >60.00)
Glucose, Bld: 120 mg/dL — ABNORMAL HIGH (ref 70–99)
Potassium: 4.1 mEq/L (ref 3.5–5.1)
Sodium: 140 mEq/L (ref 135–145)
Total Bilirubin: 0.5 mg/dL (ref 0.2–1.2)
Total Protein: 7.1 g/dL (ref 6.0–8.3)

## 2022-04-14 LAB — CBC WITH DIFFERENTIAL/PLATELET
Basophils Absolute: 0 10*3/uL (ref 0.0–0.1)
Basophils Relative: 0.3 % (ref 0.0–3.0)
Eosinophils Absolute: 0.1 10*3/uL (ref 0.0–0.7)
Eosinophils Relative: 1.8 % (ref 0.0–5.0)
HCT: 45 % (ref 39.0–52.0)
Hemoglobin: 15.1 g/dL (ref 13.0–17.0)
Lymphocytes Relative: 22.4 % (ref 12.0–46.0)
Lymphs Abs: 1.7 10*3/uL (ref 0.7–4.0)
MCHC: 33.5 g/dL (ref 30.0–36.0)
MCV: 89.7 fl (ref 78.0–100.0)
Monocytes Absolute: 0.6 10*3/uL (ref 0.1–1.0)
Monocytes Relative: 7.6 % (ref 3.0–12.0)
Neutro Abs: 5.1 10*3/uL (ref 1.4–7.7)
Neutrophils Relative %: 67.9 % (ref 43.0–77.0)
Platelets: 228 10*3/uL (ref 150.0–400.0)
RBC: 5.02 Mil/uL (ref 4.22–5.81)
RDW: 14.4 % (ref 11.5–15.5)
WBC: 7.5 10*3/uL (ref 4.0–10.5)

## 2022-04-14 LAB — TSH: TSH: 1.08 u[IU]/mL (ref 0.35–5.50)

## 2022-04-14 LAB — LIPID PANEL
Cholesterol: 201 mg/dL — ABNORMAL HIGH (ref 0–200)
HDL: 36.9 mg/dL — ABNORMAL LOW (ref >39.00)
LDL Cholesterol: 129 mg/dL — ABNORMAL HIGH (ref 0–99)
NonHDL: 164.15
Total CHOL/HDL Ratio: 5
Triglycerides: 174 mg/dL — ABNORMAL HIGH (ref 0.0–149.0)
VLDL: 34.8 mg/dL (ref 0.0–40.0)

## 2022-04-14 LAB — HEMOGLOBIN A1C: Hgb A1c MFr Bld: 6.8 % — ABNORMAL HIGH (ref 4.6–6.5)

## 2022-04-14 LAB — VITAMIN B12: Vitamin B-12: 273 pg/mL (ref 211–911)

## 2022-04-14 NOTE — Assessment & Plan Note (Deleted)
hgba1c acceptable, minimize simple carbs. Increase exercise as tolerated.  

## 2022-04-14 NOTE — Assessment & Plan Note (Signed)
No recent exacerbation 

## 2022-04-14 NOTE — Assessment & Plan Note (Signed)
Supplement and monitor 

## 2022-04-14 NOTE — Assessment & Plan Note (Signed)
Tolerating statin, encouraged heart healthy diet, avoid trans fats, minimize simple carbs and saturated fats. Increase exercise as tolerated 

## 2022-04-14 NOTE — Assessment & Plan Note (Signed)
Stable on Armour Thyroid

## 2022-04-15 ENCOUNTER — Other Ambulatory Visit (HOSPITAL_BASED_OUTPATIENT_CLINIC_OR_DEPARTMENT_OTHER): Payer: Self-pay

## 2022-04-15 ENCOUNTER — Ambulatory Visit: Payer: Medicare PPO | Admitting: Family Medicine

## 2022-04-15 VITALS — BP 110/62 | HR 68 | Temp 98.0°F | Resp 16 | Ht 71.0 in | Wt 169.6 lb

## 2022-04-15 DIAGNOSIS — E118 Type 2 diabetes mellitus with unspecified complications: Secondary | ICD-10-CM

## 2022-04-15 DIAGNOSIS — E039 Hypothyroidism, unspecified: Secondary | ICD-10-CM | POA: Diagnosis not present

## 2022-04-15 DIAGNOSIS — E785 Hyperlipidemia, unspecified: Secondary | ICD-10-CM | POA: Diagnosis not present

## 2022-04-15 DIAGNOSIS — M353 Polymyalgia rheumatica: Secondary | ICD-10-CM | POA: Diagnosis not present

## 2022-04-15 DIAGNOSIS — R739 Hyperglycemia, unspecified: Secondary | ICD-10-CM

## 2022-04-15 DIAGNOSIS — E559 Vitamin D deficiency, unspecified: Secondary | ICD-10-CM | POA: Diagnosis not present

## 2022-04-15 DIAGNOSIS — E1169 Type 2 diabetes mellitus with other specified complication: Secondary | ICD-10-CM | POA: Diagnosis not present

## 2022-04-15 MED ORDER — BOOSTRIX 5-2.5-18.5 LF-MCG/0.5 IM SUSY
PREFILLED_SYRINGE | INTRAMUSCULAR | 0 refills | Status: DC
  Filled 2022-04-15: qty 0.5, 1d supply, fill #0

## 2022-04-15 NOTE — Patient Instructions (Signed)

## 2022-04-15 NOTE — Progress Notes (Signed)
Subjective:   By signing my name below, I, Kellie Simmering, attest that this documentation has been prepared under the direction and in the presence of [name], MD., 04/15/2022.   Patient ID: Paul Ryan, male    DOB: 01-22-1946, 77 y.o.   MRN: [id number]  Chief Complaint  Patient presents with   Follow-up    Follow up   HPI Patient is in today for an office visit. He denies CP/palpitations/SOB/HA/congestion/ fever/chills/GI or GU symptoms.  Hyperglycemia Patient presents today with no complaints but would like to discuss his elevated HGBA1C that was discovered in his most recent blood work. He is interested in using a glucometer to record his glucose levels. Lab Results  Component Value Date   HGBA1C 6.8 (H) 04/14/2022   Immunizations Patient is interested in receiving a Tetanus immunization and will go to his pharmacy for this.  Polymyalgia Rheumatica Patient's polymyalgia rheumatica is controlled and he continues to take Prednisone 1 mg daily.  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   Hyperglycemia 12/26/2014   Hyperlipidemia    Hypothyroid 06/10/2011   Insomnia 04/24/2016   Left hip pain 12/17/2010   MCI (mild cognitive impairment) 07/22/2016   Nasal lesion 07/22/2016   Polymyalgia ([place]) 03/21/2014   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   Preventative health care 12/17/2010   Shingles 1982   right abdominal wall   Staph skin infection 07/22/2016   Thyroid nodule 11/29/2014   Urinary hesitancy 09/30/2016   Vitamin D deficiency 10/14/2011    Past Surgical History:  Procedure Laterality Date   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   30 surgeries    Family History  Problem Relation Age of Onset   Cancer Mother        unknown- everywhere   Cancer Sister        breast    Social History   Socioeconomic History   Marital status: Married    Spouse name: Not on file   Number of children: Not on file   Years of education: Not on file   Highest education level: Not on file  Occupational History   Not on file  Tobacco Use   Smoking status: Former    Packs/day: 1.00    Years: 20.00    Total pack years: 20.00    Types: Cigarettes    Quit date: 03/03/1980    Years since quitting: 42.1   Smokeless tobacco: Never  Substance and Sexual Activity   Alcohol use: No    Alcohol/week: 0.0 standard drinks of alcohol   Drug use: No   Sexual activity: Not on file  Other Topics Concern   Not on file  Social History Narrative   Not on file   Social Determinants of Health   Financial Resource Strain: Low Risk  (02/19/2022)   Overall Financial Resource Strain (CARDIA)    Difficulty of Paying Living Expenses: Not hard at all  Food Insecurity: No Food Insecurity (02/19/2022)   Hunger Vital Sign    Worried About Running Out of Food in the Last Year: Never true  Ran Out of Food in the Last Year: Never true  Transportation Needs: No Transportation Needs (02/19/2022)   PRAPARE - Hydrologist (Medical): No    Lack of Transportation (Non-Medical): No  Physical Activity: Sufficiently Active (02/19/2022)   Exercise Vital Sign    Days of Exercise per Week: 7 days    Minutes of Exercise per Session: 50 min  Stress: No Stress Concern Present (02/19/2022)   [place]    Feeling of Stress : Not at all  Social Connections: Moderately Isolated (02/19/2022)   Social Connection and Isolation Panel [NHANES]    Frequency of Communication with Friends and Family: More than three times a week    Frequency of Social  Gatherings with Friends and Family: More than three times a week    Attends Religious Services: Never    Marine scientist or Organizations: No    Attends Archivist Meetings: Never    Marital Status: Married  Human resources officer Violence: Not At Risk (02/19/2022)   Humiliation, Afraid, Rape, and Kick questionnaire    Fear of Current or Ex-Partner: No    Emotionally Abused: No    Physically Abused: No    Sexually Abused: No    Outpatient Medications Prior to Visit  Medication Sig Dispense Refill   Ascorbic Acid (VITAMIN C ER PO) Take by mouth daily.     Cholecalciferol 4000 UNITS TABS Take 1 tablet by mouth daily.     DULoxetine (CYMBALTA) 30 MG capsule Take 30 mg by mouth 2 (two) times daily.     DULoxetine (CYMBALTA) 60 MG capsule Take 60 mg by mouth daily. 60 mg at night     NON FORMULARY AHCC immune support     pantoprazole (PROTONIX) 40 MG tablet Take 40 mg by mouth daily.     predniSONE (DELTASONE) 1 MG tablet TAKE 1 TABLET BY MOUTH DAILY WITH BREAKFAST. 30 tablet 2   rosuvastatin (CRESTOR) 20 MG tablet Take 1 tablet (20 mg total) by mouth daily. 90 tablet 3   thyroid (ARMOUR THYROID) 60 MG tablet TAKE ONE TABLET BY MOUTH ON MONDAY, WEDNESDAY, THURSDAY, FRIDAY, AND SUNDAY. 65 tablet 1   warfarin (COUMADIN) 4 MG tablet TAKE 1 TABLET BY MOUTH EVERY DAY 90 tablet 1   gabapentin (NEURONTIN) 100 MG capsule Take 1-3 capsules (100-300 mg total) by mouth at bedtime. 90 capsule 2   No facility-administered medications prior to visit.    No Known Allergies  Review of Systems  Constitutional:  Negative for chills and fever.  HENT:  Negative for congestion.   Respiratory:  Negative for shortness of breath.   Cardiovascular:  Negative for chest pain and palpitations.  Gastrointestinal:  Negative for abdominal pain, blood in stool, constipation, diarrhea, nausea and vomiting.  Genitourinary:  Negative for dysuria, frequency, hematuria and urgency.  Musculoskeletal:         (+) polymyalgia rheumatica.  Skin:           Neurological:  Negative for headaches.       Objective:    Physical Exam Constitutional:      General: He is not in acute distress.    Appearance: Normal appearance. He is normal weight. He is not ill-appearing.  HENT:     Head: Normocephalic and atraumatic.     Right Ear: External ear normal.     Left Ear: External ear normal.     Nose: Nose normal.  Mouth/Throat:     Mouth: Mucous membranes are moist.     Pharynx: Oropharynx is clear.  Eyes:     General:        Right eye: No discharge.        Left eye: No discharge.     Extraocular Movements: Extraocular movements intact.     Conjunctiva/sclera: Conjunctivae normal.     Pupils: Pupils are equal, round, and reactive to light.  Cardiovascular:     Rate and Rhythm: Normal rate and regular rhythm.     Pulses: Normal pulses.     Heart sounds: Normal heart sounds. No murmur heard.    No gallop.  Pulmonary:     Effort: Pulmonary effort is normal. No respiratory distress.     Breath sounds: Normal breath sounds. No wheezing or rales.  Abdominal:     General: Bowel sounds are normal.     Palpations: Abdomen is soft.     Tenderness: There is no abdominal tenderness. There is no guarding.  Musculoskeletal:        General: Normal range of motion.     Cervical back: Normal range of motion.     Right lower leg: No edema.     Left lower leg: No edema.  Skin:    General: Skin is warm and dry.  Neurological:     Mental Status: He is alert and oriented to person, place, and time.  Psychiatric:        Mood and Affect: Mood normal.        Behavior: Behavior normal.        Judgment: Judgment normal.     BP 110/62 (BP Location: Right Arm, Patient Position: Sitting, Cuff Size: Normal)   Pulse 68   Temp 98 F (36.7 C) (Oral)   Resp 16   Ht 5' 11"$  (1.803 m)   Wt 169 lb 9.6 oz (76.9 kg)   SpO2 (!) 7%   BMI 23.65 kg/m  Wt Readings from Last 3 Encounters:  04/15/22 169 lb 9.6 oz  (76.9 kg)  02/19/22 164 lb (74.4 kg)  12/30/21 164 lb (74.4 kg)    Diabetic Foot Exam - Simple   No data filed    Lab Results  Component Value Date   WBC 7.5 04/14/2022   HGB 15.1 04/14/2022   HCT 45.0 04/14/2022   PLT 228.0 04/14/2022   GLUCOSE 120 (H) 04/14/2022   CHOL 201 (H) 04/14/2022   TRIG 174.0 (H) 04/14/2022   HDL 36.90 (L) 04/14/2022   LDLDIRECT 156.0 12/17/2021   LDLCALC 129 (H) 04/14/2022   ALT 31 04/14/2022   AST 28 04/14/2022   NA 140 04/14/2022   K 4.1 04/14/2022   CL 106 04/14/2022   CREATININE 1.12 04/14/2022   BUN 18 04/14/2022   CO2 25 04/14/2022   TSH 1.08 04/14/2022   PSA 4.29 (H) 06/20/2020   INR 2.2 09/07/2021   HGBA1C 6.8 (H) 04/14/2022   MICROALBUR 0.9 09/24/2020    Lab Results  Component Value Date   TSH 1.08 04/14/2022   Lab Results  Component Value Date   WBC 7.5 04/14/2022   HGB 15.1 04/14/2022   HCT 45.0 04/14/2022   MCV 89.7 04/14/2022   PLT 228.0 04/14/2022   Lab Results  Component Value Date   NA 140 04/14/2022   K 4.1 04/14/2022   CHLORIDE 109 11/16/2012   CO2 25 04/14/2022   GLUCOSE 120 (H) 04/14/2022   BUN 18 04/14/2022   CREATININE 1.12 04/14/2022  BILITOT 0.5 04/14/2022   ALKPHOS 61 04/14/2022   AST 28 04/14/2022   ALT 31 04/14/2022   PROT 7.1 04/14/2022   ALBUMIN 4.1 04/14/2022   CALCIUM 8.9 04/14/2022   ANIONGAP 8 09/11/2014   GFR 63.31 04/14/2022   Lab Results  Component Value Date   CHOL 201 (H) 04/14/2022   Lab Results  Component Value Date   HDL 36.90 (L) 04/14/2022   Lab Results  Component Value Date   LDLCALC 129 (H) 04/14/2022   Lab Results  Component Value Date   TRIG 174.0 (H) 04/14/2022   Lab Results  Component Value Date   CHOLHDL 5 04/14/2022   Lab Results  Component Value Date   HGBA1C 6.8 (H) 04/14/2022      Assessment & Plan:  Hyperglycemia: Encouraged patient to eat small portions of carbohydrates. Glucometer, lancets, and test strips have been prescribed today.    Immunizations: Reviewed patient's immunization history.  Labs: Routine blood work will be repeated in June 2024.  Polymyalgia Rheumatica: Controlled and  patient continues to take Prednisone 1 mg. Problem List Items Addressed This Visit     Hyperglycemia - Primary   Relevant Orders   Comprehensive metabolic panel   Hyperlipidemia    Tolerating statin, encouraged heart healthy diet, avoid trans fats, minimize simple carbs and saturated fats. Increase exercise as tolerated      Relevant Orders   Lipid panel   TSH   Hypothyroid    Stable on [place]our Thyroid      Relevant Orders   TSH   Polymyalgia rheumatica ([name])    No recent exacerbation      Relevant Orders   CBC with Differential/Platelet   Type 2 diabetes mellitus with hyperlipidemia ([name])    hgba1c acceptable, minimize simple carbs. Increase exercise as tolerated.       Relevant Orders   Hemoglobin A1c   Comprehensive metabolic panel   TSH   Vitamin D deficiency    Supplement and monitor      No orders of the defined types were placed in this encounter.  I, Penni Homans, MD, personally preformed the services described in this documentation.  All medical record entries made by the scribe were at my direction and in my presence.  I have reviewed the chart and discharge instructions (if applicable) and agree that the record reflects my personal performance and is accurate and complete. 04/15/2022  I,Mohammed Iqbal,acting as a scribe for Penni Homans, MD.,have documented all relevant documentation on the behalf of Penni Homans, MD,as directed by  Penni Homans, MD while in the presence of Penni Homans, MD.  Penni Homans, MD

## 2022-04-20 DIAGNOSIS — E1169 Type 2 diabetes mellitus with other specified complication: Secondary | ICD-10-CM | POA: Insufficient documentation

## 2022-04-20 NOTE — Assessment & Plan Note (Signed)
hgba1c acceptable, minimize simple carbs. Increase exercise as tolerated.  

## 2022-04-25 ENCOUNTER — Encounter: Payer: Self-pay | Admitting: Family Medicine

## 2022-04-28 ENCOUNTER — Other Ambulatory Visit: Payer: Self-pay

## 2022-04-28 ENCOUNTER — Telehealth: Payer: Self-pay

## 2022-04-28 MED ORDER — ONETOUCH DELICA PLUS LANCET33G MISC: 1 refills | Status: DC

## 2022-04-28 MED ORDER — ONETOUCH VERIO FLEX SYSTEM W/DEVICE KIT: PACK | 0 refills | Status: AC

## 2022-04-28 MED ORDER — ONETOUCH VERIO VI STRP: ORAL_STRIP | 1 refills | Status: DC

## 2022-04-28 NOTE — Telephone Encounter (Signed)
done 

## 2022-05-02 DIAGNOSIS — M542 Cervicalgia: Secondary | ICD-10-CM | POA: Diagnosis not present

## 2022-05-04 LAB — POCT INR: INR: 2.4 (ref 2.0–3.0)

## 2022-05-10 ENCOUNTER — Other Ambulatory Visit: Payer: Self-pay | Admitting: Family Medicine

## 2022-05-14 ENCOUNTER — Other Ambulatory Visit: Payer: Self-pay | Admitting: Family Medicine

## 2022-05-23 DIAGNOSIS — M542 Cervicalgia: Secondary | ICD-10-CM | POA: Diagnosis not present

## 2022-05-24 ENCOUNTER — Encounter: Payer: Self-pay | Admitting: Pulmonary Disease

## 2022-05-24 ENCOUNTER — Encounter: Payer: Self-pay | Admitting: Oncology

## 2022-06-05 DIAGNOSIS — K5900 Constipation, unspecified: Secondary | ICD-10-CM | POA: Diagnosis not present

## 2022-06-05 DIAGNOSIS — K219 Gastro-esophageal reflux disease without esophagitis: Secondary | ICD-10-CM | POA: Diagnosis not present

## 2022-06-09 ENCOUNTER — Ambulatory Visit (HOSPITAL_COMMUNITY): Payer: Medicare PPO

## 2022-06-10 ENCOUNTER — Inpatient Hospital Stay: Payer: Medicare PPO | Attending: Oncology | Admitting: Oncology

## 2022-06-10 DIAGNOSIS — G629 Polyneuropathy, unspecified: Secondary | ICD-10-CM | POA: Insufficient documentation

## 2022-06-10 DIAGNOSIS — Z86711 Personal history of pulmonary embolism: Secondary | ICD-10-CM | POA: Insufficient documentation

## 2022-06-10 DIAGNOSIS — Z7901 Long term (current) use of anticoagulants: Secondary | ICD-10-CM | POA: Insufficient documentation

## 2022-06-10 DIAGNOSIS — Z86718 Personal history of other venous thrombosis and embolism: Secondary | ICD-10-CM | POA: Insufficient documentation

## 2022-06-10 DIAGNOSIS — E039 Hypothyroidism, unspecified: Secondary | ICD-10-CM | POA: Insufficient documentation

## 2022-06-10 DIAGNOSIS — D6859 Other primary thrombophilia: Secondary | ICD-10-CM | POA: Insufficient documentation

## 2022-06-10 DIAGNOSIS — Z8615 Personal history of latent tuberculosis infection: Secondary | ICD-10-CM | POA: Insufficient documentation

## 2022-06-11 ENCOUNTER — Ambulatory Visit: Payer: Medicare PPO | Admitting: Family

## 2022-06-11 VITALS — BP 112/74 | HR 70 | Temp 98.0°F | Resp 16 | Wt 170.0 lb

## 2022-06-11 DIAGNOSIS — H00012 Hordeolum externum right lower eyelid: Secondary | ICD-10-CM

## 2022-06-11 NOTE — Progress Notes (Signed)
Subjective:   By signing my name below, I, Paul Ryan, attest that this documentation has been prepared under the direction and in the presence of [name], NP. 06/11/2022   Patient ID: Paul Ryan, male    DOB: 11-20-1945, 77 y.o.   MRN: [id number]  Chief Complaint  Patient presents with   Stye    Patient comes in for stye on right eye    HPI Patient is in today for an office visit.   Right eye stye: He complains of a stye on his right eye that appeared 6 days ago. He has been managing it with a warm compress and stye relief eyedrops.   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   Hyperglycemia 12/26/2014   Hyperlipidemia    Hypothyroid 06/10/2011   Insomnia 04/24/2016   Left hip pain 12/17/2010   MCI (mild cognitive impairment) 07/22/2016   Nasal lesion 07/22/2016   Polymyalgia ([name]) 03/21/2014   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   Preventative health care 12/17/2010   Shingles 1982   right abdominal wall   Staph skin infection 07/22/2016   Thyroid nodule 11/29/2014   Urinary hesitancy 09/30/2016   Vitamin D deficiency 10/14/2011    Past Surgical History:  Procedure Laterality Date   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    Family History  Problem Relation Age of Onset   Cancer Mother        unknown- everywhere   Cancer Sister        breast    Social History   Socioeconomic History   Marital status: Married    Spouse name: Not on file   Number of children: Not on file   Years of education: Not on file   Highest education level: Not on file  Occupational History   Not on file   Tobacco Use   Smoking status: Former    Packs/day: 1.00    Years: 20.00    Additional pack years: 0.00    Total pack years: 20.00    Types: Cigarettes    Quit date: 03/03/1980    Years since quitting: 42.3   Smokeless tobacco: Never  Substance and Sexual Activity   Alcohol use: No    Alcohol/week: 0.0 standard drinks of alcohol   Drug use: No   Sexual activity: Not on file  Other Topics Concern   Not on file  Social History Narrative   Not on file   Social Determinants of Health   Financial Resource Strain: Low Risk  (02/19/2022)   Overall Financial Resource Strain (CARDIA)    Difficulty of Paying Living Expenses: Not hard at all  Food Insecurity: No Food Insecurity (02/19/2022)   Hunger Vital Sign    Worried About Running Out of Food in the Last Year: Never true    Ran Out of Food in the Last Year: Never true  Transportation Needs: No Transportation Needs (02/19/2022)   PRAPARE - Administrator, Civil Service (Medical): No    Lack of Transportation (Non-Medical): No  Physical Activity: Sufficiently Active (02/19/2022)  Exercise Vital Sign    Days of Exercise per Week: 7 days    Minutes of Exercise per Session: 50 min  Stress: No Stress Concern Present (02/19/2022)   [employer] of Occupational Health - Occupational Stress Questionnaire    Feeling of Stress : Not at all  Social Connections: Moderately Isolated (02/19/2022)   Social Connection and Isolation Panel [NHANES]    Frequency of Communication with Friends and Family: More than three times a week    Frequency of Social Gatherings with Friends and Family: More than three times a week    Attends Religious Services: Never    Database administrator or Organizations: No    Attends Banker Meetings: Never    Marital Status: Married  Catering manager Violence: Not At Risk (02/19/2022)   Humiliation, Afraid, Rape, and Kick questionnaire    Fear of Current or Ex-Partner: No     Emotionally Abused: No    Physically Abused: No    Sexually Abused: No    Outpatient Medications Prior to Visit  Medication Sig Dispense Refill   Ascorbic Acid (VITAMIN C ER PO) Take by mouth daily.     Blood Glucose Monitoring Suppl (ONETOUCH VERIO FLEX SYSTEM) w/Device KIT Use to check blood sugar once a day. DX E11.9 1 kit 0   Cholecalciferol 4000 UNITS TABS Take 1 tablet by mouth daily.     DULoxetine (CYMBALTA) 30 MG capsule Take 30 mg by mouth 2 (two) times daily.     DULoxetine (CYMBALTA) 60 MG capsule Take 60 mg by mouth daily. 60 mg at night     glucose blood (ONETOUCH VERIO) test strip Use to check blood sugar . E11.9 100 each 1   Lancets (ONETOUCH DELICA PLUS LANCET33G) MISC Use as directed to check sugar.E11.9 100 each 1   NON FORMULARY AHCC immune support     pantoprazole (PROTONIX) 40 MG tablet TAKE 1 TABLET BY MOUTH EVERY DAY 90 tablet 1   predniSONE (DELTASONE) 1 MG tablet TAKE 1 TABLET BY MOUTH DAILY WITH BREAKFAST. 30 tablet 2   rosuvastatin (CRESTOR) 20 MG tablet Take 1 tablet (20 mg total) by mouth daily. 90 tablet 3   Tdap (BOOSTRIX) 5-2.5-18.5 LF-MCG/0.5 injection Inject into the muscle. 0.5 mL 0   thyroid (ARMOUR THYROID) 60 MG tablet TAKE ONE TABLET BY MOUTH ON MONDAY, WEDNESDAY, THURSDAY, FRIDAY, AND SUNDAY. 65 tablet 1   warfarin (COUMADIN) 4 MG tablet TAKE 1 TABLET BY MOUTH EVERY DAY 90 tablet 1   No facility-administered medications prior to visit.    No Known Allergies  ROS    See HPI Objective:    Physical Exam Constitutional:      Appearance: Normal appearance.  HENT:     Head:     Comments: Stye noted inside right lower medial eye lid Eyes:     Comments: Stye on right lower eyelid  Neurological:     Mental Status: He is alert and oriented to person, place, and time.  Psychiatric:        Mood and Affect: Mood normal.        Behavior: Behavior normal.        Thought Content: Thought content normal.        Judgment: Judgment normal.       BP 112/74 (BP Location: Right Arm, Patient Position: Sitting, Cuff Size: Small)   Pulse 70   Temp 98 F (36.7 C) (Oral)   Resp 16   Wt 170 lb (77.1  kg)   SpO2 99%   BMI 23.71 kg/m  Wt Readings from Last 3 Encounters:  06/11/22 170 lb (77.1 kg)  04/15/22 169 lb 9.6 oz (76.9 kg)  02/19/22 164 lb (74.4 kg)       Assessment & Plan:  Hordeolum externum of right lower eyelid Assessment & Plan: New. Notes overall decrease in surrounding erythema with minimal discomfort.  I encouraged him to continue warm compresses as he has been doing and keep a close watch of the area. If symptoms worsen or if they do not improve in the next week or two, he is advised to let us know.       I, Lemont FillersMelissa S O'Sullivan, NP, personally preformed the services described in this documentation.  All medical record entries made by the scribe were at my direction and in my presence.  I have reviewed the chart and discharge instructions (if applicable) and agree that the record reflects my personal performance and is accurate and complete. 06/11/2022  Lemont FillersMelissa S O'Sullivan, NP

## 2022-06-11 NOTE — Assessment & Plan Note (Signed)
New. Notes overall decrease in surrounding erythema with minimal discomfort.  I encouraged him to continue warm compresses as he has been doing and keep a close watch of the area. If symptoms worsen or if they do not improve in the next week or two, he is advised to let us know.

## 2022-06-12 ENCOUNTER — Encounter: Payer: Self-pay | Admitting: Family Medicine

## 2022-06-12 ENCOUNTER — Other Ambulatory Visit: Payer: Self-pay | Admitting: Cardiology

## 2022-06-13 ENCOUNTER — Encounter: Payer: Self-pay | Admitting: Cardiology

## 2022-06-16 MED ORDER — ROSUVASTATIN CALCIUM 10 MG PO TABS: 10.0000 mg | ORAL_TABLET | Freq: Every day | ORAL | 0 refills | Status: DC

## 2022-06-19 ENCOUNTER — Ambulatory Visit: Payer: Medicare PPO | Admitting: Oncology

## 2022-06-20 DIAGNOSIS — M542 Cervicalgia: Secondary | ICD-10-CM | POA: Diagnosis not present

## 2022-06-24 LAB — POCT INR: INR: 2.3 (ref 2.0–3.0)

## 2022-06-25 ENCOUNTER — Other Ambulatory Visit: Payer: Self-pay | Admitting: Oncology

## 2022-06-25 ENCOUNTER — Other Ambulatory Visit: Payer: Self-pay | Admitting: Cardiology

## 2022-06-25 DIAGNOSIS — I82509 Chronic embolism and thrombosis of unspecified deep veins of unspecified lower extremity: Secondary | ICD-10-CM

## 2022-06-30 ENCOUNTER — Encounter (HOSPITAL_COMMUNITY): Payer: Self-pay

## 2022-06-30 ENCOUNTER — Ambulatory Visit (HOSPITAL_COMMUNITY)
Admission: RE | Admit: 2022-06-30 | Discharge: 2022-06-30 | Disposition: A | Discharge status: Home / Self Care | Payer: Medicare PPO | Source: Ambulatory Visit | Attending: Pulmonary Disease | Admitting: Pulmonary Disease

## 2022-06-30 DIAGNOSIS — R911 Solitary pulmonary nodule: Secondary | ICD-10-CM | POA: Diagnosis not present

## 2022-06-30 DIAGNOSIS — R918 Other nonspecific abnormal finding of lung field: Secondary | ICD-10-CM | POA: Insufficient documentation

## 2022-06-30 DIAGNOSIS — J479 Bronchiectasis, uncomplicated: Secondary | ICD-10-CM | POA: Diagnosis not present

## 2022-07-01 ENCOUNTER — Inpatient Hospital Stay: Payer: Medicare PPO | Admitting: Oncology

## 2022-07-01 VITALS — BP 131/88 | HR 77 | Temp 98.1°F | Resp 18 | Ht 71.0 in | Wt 170.3 lb

## 2022-07-01 DIAGNOSIS — I82509 Chronic embolism and thrombosis of unspecified deep veins of unspecified lower extremity: Secondary | ICD-10-CM

## 2022-07-01 DIAGNOSIS — M5459 Other low back pain: Secondary | ICD-10-CM | POA: Diagnosis not present

## 2022-07-01 DIAGNOSIS — Z86711 Personal history of pulmonary embolism: Secondary | ICD-10-CM | POA: Diagnosis not present

## 2022-07-01 DIAGNOSIS — D6859 Other primary thrombophilia: Secondary | ICD-10-CM | POA: Diagnosis not present

## 2022-07-01 DIAGNOSIS — Z8615 Personal history of latent tuberculosis infection: Secondary | ICD-10-CM | POA: Diagnosis not present

## 2022-07-01 DIAGNOSIS — Z7901 Long term (current) use of anticoagulants: Secondary | ICD-10-CM | POA: Diagnosis not present

## 2022-07-01 DIAGNOSIS — E039 Hypothyroidism, unspecified: Secondary | ICD-10-CM | POA: Diagnosis not present

## 2022-07-01 DIAGNOSIS — M5030 Other cervical disc degeneration, unspecified cervical region: Secondary | ICD-10-CM | POA: Diagnosis not present

## 2022-07-01 DIAGNOSIS — G629 Polyneuropathy, unspecified: Secondary | ICD-10-CM | POA: Diagnosis not present

## 2022-07-01 DIAGNOSIS — Z86718 Personal history of other venous thrombosis and embolism: Secondary | ICD-10-CM | POA: Diagnosis not present

## 2022-07-01 DIAGNOSIS — M5136 Other intervertebral disc degeneration, lumbar region: Secondary | ICD-10-CM | POA: Diagnosis not present

## 2022-07-01 NOTE — Progress Notes (Signed)
  [facility] OFFICE PROGRESS NOTE   Diagnosis: Chronic anticoagulation  INTERVAL HISTORY:   [name] returns as scheduled.  He continues Coumadin anticoagulation.  No bleeding or symptom of recurrent thrombosis.  His chief complaint is neuropathy symptoms in the feet.  He is followed by neurology in [place].  He continues to monitor the home PT/INR.  He reports seasonal allergies.  He has wheezing.  He underwent a chest yesterday to follow-up on a lung nodule.  He is scheduled to see Dr. Tonia Brooms next week.  Objective:  Vital signs in last 24 hours:  Blood pressure 131/88, pulse 77, temperature 98.1 F (36.7 C), temperature source Oral, resp. rate 18, height 5\' 11"  (1.803 m), weight 170 lb 4.8 oz (77.2 kg), SpO2 100 %.    Resp: Scattered coarse and inspiratory rhonchi, no respiratory distress Cardio: Regular rate and rhythm GI: No hepatosplenomegaly Vascular: No leg edema, varicosities at the right greater than left lower leg   Lab Results:  Lab Results  Component Value Date   WBC 7.5 04/14/2022   HGB 15.1 04/14/2022   HCT 45.0 04/14/2022   MCV 89.7 04/14/2022   PLT 228.0 04/14/2022   NEUTROABS 5.1 04/14/2022    CMP  Lab Results  Component Value Date   NA 140 04/14/2022   K 4.1 04/14/2022   CL 106 04/14/2022   CO2 25 04/14/2022   GLUCOSE 120 (H) 04/14/2022   BUN 18 04/14/2022   CREATININE 1.12 04/14/2022   CALCIUM 8.9 04/14/2022   PROT 7.1 04/14/2022   ALBUMIN 4.1 04/14/2022   AST 28 04/14/2022   ALT 31 04/14/2022   ALKPHOS 61 04/14/2022   BILITOT 0.5 04/14/2022   GFRNONAA 50 (L) 09/11/2014   GFRAA 58 (L) 09/11/2014     Lab Results  Component Value Date   INR 2.2 09/07/2021   LABPROT 22.8 (H) 09/18/2020    Medications: I have reviewed the patient's current medications.   Assessment/Plan: Protein S deficiency Right lower extremity deep vein thrombosis, unprovoked, June 2010 treated with 1 year of Coumadin anticoagulation Placed back on  Coumadin after protein S level returned low while off of Coumadin Anticoagulation changed to Xarelto April 2015 Pulmonary embolism 08/07/2014- anticoagulation changed back to Coumadin  2.  History of latent tuberculosis treated with [name] in 2017 3.  Hypothyroidism 4.  History of collagen vascular disease-unspecified 5.  Right abdominal wall zoster 1982     Disposition: Paul Ryan has a history of protein S deficiency.  He continues warfarin anticoagulation.  He monitors the PT/INR at home.  He provided recent PT/INR data and the PT/INR has been in the therapeutic range over the past 5 months.  He will continue warfarin at the current dose.  He will follow-up with Dr. Delton Ryan to review the chest CT from yesterday.  He will continue follow-up with neurology for management of neuropathy.  Paul Ryan will return for an office visit in 1 year.  Thornton Papas, MD  07/01/2022  11:16 AM

## 2022-07-03 DIAGNOSIS — Z7901 Long term (current) use of anticoagulants: Secondary | ICD-10-CM | POA: Diagnosis not present

## 2022-07-03 DIAGNOSIS — I2699 Other pulmonary embolism without acute cor pulmonale: Secondary | ICD-10-CM | POA: Diagnosis not present

## 2022-07-03 DIAGNOSIS — D6859 Other primary thrombophilia: Secondary | ICD-10-CM | POA: Diagnosis not present

## 2022-07-03 LAB — POCT INR: INR: 3.4 — AB (ref 2.0–3.0)

## 2022-07-04 ENCOUNTER — Encounter: Payer: Self-pay | Admitting: Oncology

## 2022-07-04 ENCOUNTER — Encounter: Payer: Self-pay | Admitting: Family Medicine

## 2022-07-04 ENCOUNTER — Other Ambulatory Visit: Payer: Self-pay | Admitting: Family Medicine

## 2022-07-04 MED ORDER — DOXYCYCLINE HYCLATE 100 MG PO TABS: 100.0000 mg | ORAL_TABLET | Freq: Two times a day (BID) | ORAL | 0 refills | Status: DC

## 2022-07-09 ENCOUNTER — Ambulatory Visit: Payer: Medicare PPO | Admitting: Pulmonary Disease

## 2022-07-09 ENCOUNTER — Encounter: Payer: Self-pay | Admitting: Pulmonary Disease

## 2022-07-09 VITALS — BP 112/86 | HR 65 | Ht 71.0 in | Wt 170.4 lb

## 2022-07-09 DIAGNOSIS — Z87891 Personal history of nicotine dependence: Secondary | ICD-10-CM | POA: Diagnosis not present

## 2022-07-09 DIAGNOSIS — R918 Other nonspecific abnormal finding of lung field: Secondary | ICD-10-CM

## 2022-07-09 DIAGNOSIS — R911 Solitary pulmonary nodule: Secondary | ICD-10-CM | POA: Diagnosis not present

## 2022-07-09 NOTE — Progress Notes (Signed)
Synopsis: Referred in May 2023 for lung nodule by [name], MD  Subjective:   PATIENT ID: Paul Ryan GENDER: male DOB: 1945/12/27, MRN: [id number]  No chief complaint on file.   This is a 77 year old gentleman, past medical history of DVT, protein S deficiency, hyperlipidemia, former smoker quit 40+ years ago.  He works as a Psychologist, educational.  He has been a long standing advocate for science and art.  And spends significant amount of time lecturing and traveling.  He had a cardiac scoring CT complete which revealed a small groundglass opacity in the right lung that was 6 mm in size.  Here today for evaluation of this nodule and appropriate neck steps.  We also reviewed his CT imaging today in the office.  OV 07/09/2022:Patient here today for CT scan follow-up for pulmonary nodule.  CT completed on 06/30/2022.  Shows a stable 6 mm groundglass nodule in the right upper lobe.  No respiratory symptoms.  Reviewed CT imaging today in the office.     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   Hyperglycemia 12/26/2014   Hyperlipidemia    Hypothyroid 06/10/2011   Insomnia 04/24/2016   Left hip pain 12/17/2010   MCI (mild cognitive impairment) 07/22/2016   Nasal lesion 07/22/2016   Polymyalgia ([name]) 03/21/2014   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   Preventative health care 12/17/2010   Shingles 1982   right abdominal wall   Staph skin infection 07/22/2016   Thyroid nodule 11/29/2014   Urinary hesitancy 09/30/2016   Vitamin D deficiency 10/14/2011     Family History  Problem Relation Age of Onset   Cancer Mother        unknown- everywhere    Cancer Sister        breast     Past Surgical History:  Procedure Laterality Date   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    Social History   Socioeconomic History   Marital status: Married    Spouse name: Not on file   Number of children: Not on file   Years of education: Not on file   Highest education level: Not on file  Occupational History   Not on file  Tobacco Use   Smoking status: Former    Packs/day: 1.00    Years: 20.00    Additional pack years: 0.00    Total pack years: 20.00    Types: Cigarettes    Quit date: 03/03/1980    Years since quitting: 42.3   Smokeless tobacco: Never  Substance and Sexual Activity   Alcohol use: No    Alcohol/week: 0.0 standard drinks of alcohol   Drug use: No   Sexual activity: Not on file  Other Topics Concern   Not on file  Social History Narrative   Not on file   Social Determinants of Health   Financial Resource Strain: Low Risk  (02/19/2022)   Overall Financial Resource Strain (CARDIA)    Difficulty of Paying Living Expenses: Not hard at all  Food Insecurity: No Food Insecurity (02/19/2022)   Hunger Vital Sign  Worried About Programme researcher, broadcasting/film/video in the Last Year: Never true    Ran Out of Food in the Last Year: Never true  Transportation Needs: No Transportation Needs (02/19/2022)   PRAPARE - Administrator, Civil Service (Medical): No    Lack of Transportation (Non-Medical): No  Physical Activity: Sufficiently Active (02/19/2022)   Exercise Vital Sign    Days of Exercise per Week: 7 days    Minutes of Exercise per Session: 50 min  Stress: No Stress Concern Present (02/19/2022)   [employer] of Occupational Health - Occupational Stress Questionnaire    Feeling of Stress : Not at all  Social Connections: Moderately Isolated (02/19/2022)   Social Connection and Isolation Panel [NHANES]    Frequency of Communication with Friends and Family: More than three times a week    Frequency  of Social Gatherings with Friends and Family: More than three times a week    Attends Religious Services: Never    Database administrator or Organizations: No    Attends Banker Meetings: Never    Marital Status: Married  Catering manager Violence: Not At Risk (02/19/2022)   Humiliation, Afraid, Rape, and Kick questionnaire    Fear of Current or Ex-Partner: No    Emotionally Abused: No    Physically Abused: No    Sexually Abused: No     No Known Allergies   Outpatient Medications Prior to Visit  Medication Sig Dispense Refill   Ascorbic Acid (VITAMIN C ER PO) Take by mouth daily.     Blood Glucose Monitoring Suppl (ONETOUCH VERIO FLEX SYSTEM) w/Device KIT Use to check blood sugar once a day. DX E11.9 1 kit 0   Cholecalciferol 4000 UNITS TABS Take 1 tablet by mouth daily.     doxycycline (VIBRA-TABS) 100 MG tablet Take 1 tablet (100 mg total) by mouth 2 (two) times daily. 14 tablet 0   DULoxetine (CYMBALTA) 30 MG capsule Take 30 mg by mouth 2 (two) times daily.     DULoxetine (CYMBALTA) 60 MG capsule Take 60 mg by mouth daily. 60 mg at night     glucose blood (ONETOUCH VERIO) test strip Use to check blood sugar . E11.9 100 each 1   Lancets (ONETOUCH DELICA PLUS LANCET33G) MISC Use as directed to check sugar.E11.9 100 each 1   NON FORMULARY AHCC immune support     pantoprazole (PROTONIX) 40 MG tablet TAKE 1 TABLET BY MOUTH EVERY DAY 90 tablet 1   predniSONE (DELTASONE) 1 MG tablet TAKE 1 TABLET BY MOUTH DAILY WITH BREAKFAST. 30 tablet 2   rosuvastatin (CRESTOR) 10 MG tablet Take 1 tablet (10 mg total) by mouth daily. 90 tablet 0   thyroid (ARMOUR THYROID) 60 MG tablet TAKE ONE TABLET BY MOUTH ON MONDAY, WEDNESDAY, THURSDAY, FRIDAY, AND SUNDAY. 65 tablet 1   warfarin (COUMADIN) 4 MG tablet TAKE 1 TABLET BY MOUTH EVERY DAY 90 tablet 0   No facility-administered medications prior to visit.    Review of Systems  Constitutional:  Negative for chills, fever, malaise/fatigue  and weight loss.  HENT:  Negative for hearing loss, sore throat and tinnitus.   Eyes:  Negative for blurred vision and double vision.  Respiratory:  Negative for cough, hemoptysis, sputum production, shortness of breath, wheezing and stridor.   Cardiovascular:  Negative for chest pain, palpitations, orthopnea, leg swelling and PND.  Gastrointestinal:  Negative for abdominal pain, constipation, diarrhea, heartburn, nausea and vomiting.  Genitourinary:  Negative for dysuria,  hematuria and urgency.  Musculoskeletal:  Negative for joint pain and myalgias.  Skin:  Negative for itching and rash.  Neurological:  Negative for dizziness, tingling, weakness and headaches.  Endo/Heme/Allergies:  Negative for environmental allergies. Does not bruise/bleed easily.  Psychiatric/Behavioral:  Negative for depression. The patient is not nervous/anxious and does not have insomnia.   All other systems reviewed and are negative.    Objective:  Physical Exam Vitals reviewed.  Constitutional:      General: He is not in acute distress.    Appearance: He is well-developed.  HENT:     Head: Normocephalic and atraumatic.  Eyes:     General: No scleral icterus.    Conjunctiva/sclera: Conjunctivae normal.     Pupils: Pupils are equal, round, and reactive to light.  Neck:     Vascular: No JVD.     Trachea: No tracheal deviation.  Cardiovascular:     Rate and Rhythm: Normal rate and regular rhythm.     Heart sounds: Normal heart sounds. No murmur heard. Pulmonary:     Effort: Pulmonary effort is normal. No tachypnea, accessory muscle usage or respiratory distress.     Breath sounds: No stridor. No wheezing, rhonchi or rales.  Abdominal:     General: There is no distension.     Palpations: Abdomen is soft.     Tenderness: There is no abdominal tenderness.  Musculoskeletal:        General: No tenderness.     Cervical back: Neck supple.  Lymphadenopathy:     Cervical: No cervical adenopathy.  Skin:     General: Skin is warm and dry.     Capillary Refill: Capillary refill takes less than 2 seconds.     Findings: No rash.  Neurological:     Mental Status: He is alert and oriented to person, place, and time.  Psychiatric:        Behavior: Behavior normal.      Vitals:   07/09/22 1029  BP: 112/86  Pulse: 65  SpO2: 93%  Weight: 170 lb 6.4 oz (77.3 kg)  Height: 5\' 11"  (1.803 m)    93% on RA BMI Readings from Last 3 Encounters:  07/09/22 23.77 kg/m  07/01/22 23.75 kg/m  06/11/22 23.71 kg/m   Wt Readings from Last 3 Encounters:  07/09/22 170 lb 6.4 oz (77.3 kg)  07/01/22 170 lb 4.8 oz (77.2 kg)  06/11/22 170 lb (77.1 kg)     CBC    Component Value Date/Time   WBC 7.5 04/14/2022 0801   RBC 5.02 04/14/2022 0801   HGB 15.1 04/14/2022 0801   HGB 15.3 06/10/2016 1033   HGB 15.2 05/17/2013 0902   HCT 45.0 04/14/2022 0801   HCT 45.0 06/10/2016 1033   HCT 44.6 05/17/2013 0902   PLT 228.0 04/14/2022 0801   PLT 216 06/10/2016 1033   MCV 89.7 04/14/2022 0801   MCV 90 06/10/2016 1033   MCV 89.4 05/17/2013 0902   MCH 30.5 06/10/2016 1033   MCH 31.1 09/19/2014 1135   MCHC 33.5 04/14/2022 0801   RDW 14.4 04/14/2022 0801   RDW 14.7 06/10/2016 1033   RDW 13.7 05/17/2013 0902   LYMPHSABS 1.7 04/14/2022 0801   LYMPHSABS 2.2 06/10/2016 1033   LYMPHSABS 1.7 05/17/2013 0902   MONOABS 0.6 04/14/2022 0801   MONOABS 0.4 05/17/2013 0902   EOSABS 0.1 04/14/2022 0801   EOSABS 0.2 06/10/2016 1033   BASOSABS 0.0 04/14/2022 0801   BASOSABS 0.0 06/10/2016 1033   BASOSABS 0.0  05/17/2013 0902     Chest Imaging: 06/04/2021 CT cardiac scoring. Small 6 mm right-sided groundglass opacity. The patient's images have been independently reviewed by me.    Pulmonary Functions Testing Results:     No data to display          FeNO:   Pathology:   Echocardiogram:   Heart Catheterization:     Assessment & Plan:     ICD-10-CM   1. Lung nodule  R91.1 CT Chest Wo Contrast    2.  Ground glass opacity present on imaging of lung  R91.8 CT Chest Wo Contrast    3. Former smoker  Z87.891       Discussion:  This is a 77 year old gentleman, former smoker quit 40 years ago found to have an incidental 6 mm groundglass nodule in the right lung on a cardiac scoring CT.  He had follow-up CT imaging this year that shows stability of the 6 mm groundglass lesion.  Plan: Repeat noncontrast CT chest in 12 months. He can follow-up with [place] in 1 year after CT complete.   Current Outpatient Medications:    Ascorbic Acid (VITAMIN C ER PO), Take by mouth daily., Disp: , Rfl:    Blood Glucose Monitoring Suppl (ONETOUCH VERIO FLEX SYSTEM) w/Device KIT, Use to check blood sugar once a day. DX E11.9, Disp: 1 kit, Rfl: 0   Cholecalciferol 4000 UNITS TABS, Take 1 tablet by mouth daily., Disp: , Rfl:    doxycycline (VIBRA-TABS) 100 MG tablet, Take 1 tablet (100 mg total) by mouth 2 (two) times daily., Disp: 14 tablet, Rfl: 0   DULoxetine (CYMBALTA) 30 MG capsule, Take 30 mg by mouth 2 (two) times daily., Disp: , Rfl:    DULoxetine (CYMBALTA) 60 MG capsule, Take 60 mg by mouth daily. 60 mg at night, Disp: , Rfl:    glucose blood (ONETOUCH VERIO) test strip, Use to check blood sugar . E11.9, Disp: 100 each, Rfl: 1   Lancets (ONETOUCH DELICA PLUS LANCET33G) MISC, Use as directed to check sugar.E11.9, Disp: 100 each, Rfl: 1   NON FORMULARY, AHCC immune support, Disp: , Rfl:    pantoprazole (PROTONIX) 40 MG tablet, TAKE 1 TABLET BY MOUTH EVERY DAY, Disp: 90 tablet, Rfl: 1   predniSONE (DELTASONE) 1 MG tablet, TAKE 1 TABLET BY MOUTH DAILY WITH BREAKFAST., Disp: 30 tablet, Rfl: 2   rosuvastatin (CRESTOR) 10 MG tablet, Take 1 tablet (10 mg total) by mouth daily., Disp: 90 tablet, Rfl: 0   thyroid (ARMOUR THYROID) 60 MG tablet, TAKE ONE TABLET BY MOUTH ON MONDAY, WEDNESDAY, THURSDAY, FRIDAY, AND SUNDAY., Disp: 65 tablet, Rfl: 1   warfarin (COUMADIN) 4 MG tablet, TAKE 1 TABLET BY MOUTH EVERY DAY, Disp:  90 tablet, Rfl: 0   Josephine Igo, DO [facility] 07/09/2022 10:44 AM

## 2022-07-09 NOTE — Patient Instructions (Signed)
Thank you for visiting Dr. Tonia Brooms at [facility]. Today we recommend the following:  Orders Placed This Encounter  Procedures   CT Chest Wo Contrast   Return in about 1 year (around 07/09/2023) for with Kandice Robinsons, NP, or Dr. Tonia Brooms, after CT Chest.    Please do your part to reduce the spread of COVID-19.

## 2022-07-11 ENCOUNTER — Encounter: Payer: Self-pay | Admitting: Oncology

## 2022-07-11 DIAGNOSIS — M542 Cervicalgia: Secondary | ICD-10-CM | POA: Diagnosis not present

## 2022-07-11 LAB — POCT INR: INR: 2.5 (ref 2.0–3.0)

## 2022-07-15 ENCOUNTER — Encounter: Payer: Self-pay | Admitting: Oncology

## 2022-07-16 ENCOUNTER — Other Ambulatory Visit: Payer: Self-pay | Admitting: Family Medicine

## 2022-07-18 ENCOUNTER — Encounter: Payer: Self-pay | Admitting: Family Medicine

## 2022-07-18 DIAGNOSIS — M542 Cervicalgia: Secondary | ICD-10-CM | POA: Diagnosis not present

## 2022-07-18 DIAGNOSIS — M5451 Vertebrogenic low back pain: Secondary | ICD-10-CM | POA: Diagnosis not present

## 2022-07-21 ENCOUNTER — Other Ambulatory Visit: Payer: Self-pay | Admitting: Family Medicine

## 2022-07-21 DIAGNOSIS — H00012 Hordeolum externum right lower eyelid: Secondary | ICD-10-CM

## 2022-07-25 DIAGNOSIS — H0012 Chalazion right lower eyelid: Secondary | ICD-10-CM | POA: Diagnosis not present

## 2022-07-29 DIAGNOSIS — N401 Enlarged prostate with lower urinary tract symptoms: Secondary | ICD-10-CM | POA: Diagnosis not present

## 2022-07-29 DIAGNOSIS — N5201 Erectile dysfunction due to arterial insufficiency: Secondary | ICD-10-CM | POA: Diagnosis not present

## 2022-07-29 DIAGNOSIS — R3915 Urgency of urination: Secondary | ICD-10-CM | POA: Diagnosis not present

## 2022-08-04 DIAGNOSIS — M47816 Spondylosis without myelopathy or radiculopathy, lumbar region: Secondary | ICD-10-CM | POA: Diagnosis not present

## 2022-08-04 DIAGNOSIS — M5136 Other intervertebral disc degeneration, lumbar region: Secondary | ICD-10-CM | POA: Diagnosis not present

## 2022-08-04 DIAGNOSIS — M47812 Spondylosis without myelopathy or radiculopathy, cervical region: Secondary | ICD-10-CM | POA: Diagnosis not present

## 2022-08-04 DIAGNOSIS — M5030 Other cervical disc degeneration, unspecified cervical region: Secondary | ICD-10-CM | POA: Diagnosis not present

## 2022-08-04 DIAGNOSIS — M47896 Other spondylosis, lumbar region: Secondary | ICD-10-CM | POA: Diagnosis not present

## 2022-08-08 DIAGNOSIS — M542 Cervicalgia: Secondary | ICD-10-CM | POA: Diagnosis not present

## 2022-08-14 ENCOUNTER — Encounter: Payer: Self-pay | Admitting: Family Medicine

## 2022-08-14 ENCOUNTER — Ambulatory Visit: Payer: Medicare PPO | Admitting: Family Medicine

## 2022-08-14 VITALS — Ht 71.0 in | Wt 169.0 lb

## 2022-08-14 DIAGNOSIS — E1169 Type 2 diabetes mellitus with other specified complication: Secondary | ICD-10-CM | POA: Diagnosis not present

## 2022-08-14 DIAGNOSIS — E785 Hyperlipidemia, unspecified: Secondary | ICD-10-CM | POA: Diagnosis not present

## 2022-08-14 DIAGNOSIS — R2689 Other abnormalities of gait and mobility: Secondary | ICD-10-CM

## 2022-08-14 DIAGNOSIS — E039 Hypothyroidism, unspecified: Secondary | ICD-10-CM

## 2022-08-14 DIAGNOSIS — H6122 Impacted cerumen, left ear: Secondary | ICD-10-CM | POA: Diagnosis not present

## 2022-08-14 LAB — COMPREHENSIVE METABOLIC PANEL
ALT: 27 U/L (ref 0–53)
AST: 28 U/L (ref 0–37)
Albumin: 4.1 g/dL (ref 3.5–5.2)
Alkaline Phosphatase: 59 U/L (ref 39–117)
BUN: 15 mg/dL (ref 6–23)
CO2: 26 mEq/L (ref 19–32)
Calcium: 9 mg/dL (ref 8.4–10.5)
Chloride: 106 mEq/L (ref 96–112)
Creatinine, Ser: 1.11 mg/dL (ref 0.40–1.50)
GFR: 63.84 mL/min (ref >60.00)
Glucose, Bld: 105 mg/dL — ABNORMAL HIGH (ref 70–99)
Potassium: 4.4 mEq/L (ref 3.5–5.1)
Sodium: 139 mEq/L (ref 135–145)
Total Bilirubin: 0.6 mg/dL (ref 0.2–1.2)
Total Protein: 7 g/dL (ref 6.0–8.3)

## 2022-08-14 LAB — MICROALBUMIN / CREATININE URINE RATIO
Creatinine,U: 133.8 mg/dL
Microalb Creat Ratio: 0.5 mg/g (ref 0.0–30.0)
Microalb, Ur: <0.7 mg/dL (ref 0.0–1.9)

## 2022-08-14 LAB — TSH: TSH: 1.34 u[IU]/mL (ref 0.35–5.50)

## 2022-08-14 LAB — HEMOGLOBIN A1C: Hgb A1c MFr Bld: 6.7 % — ABNORMAL HIGH (ref 4.6–6.5)

## 2022-08-14 NOTE — Patient Instructions (Addendum)
Blood pressure with mild drop on orthostatic vitals - make sure you are staying well hydrated and changing positions slowly Left ear with impaction which we cleaned out today. We will update your labs today Neuro exam was reassuring  Please contact office for follow-up if symptoms do not improve or worsen. Seek emergency care if symptoms become severe.

## 2022-08-14 NOTE — Progress Notes (Signed)
Acute Office Visit  Subjective:     Patient ID: Paul Ryan, male    DOB: 1945/10/10, 77 y.o.   MRN: [id number]  Chief Complaint  Patient presents with   Medical Management of Chronic Issues    HPI Patient is in today for feeling "off-balanced"  Discussed the use of AI scribe software for clinical note transcription with the patient, who gave verbal consent to proceed.  History of Present Illness   The patient, with a history of prediabetes and polymyalgia rheumatica (PMR), presents with balance issues for the past few months. They describe the sensation as not being dizzy, but rather feeling as if they've had too much to drink and are trying to walk in a straight line. The patient denies any vision changes, nausea, or spinning sensations. They note that the balance issues are not constant, but occur at least once daily. The patient is physically active, walking three miles every morning, and mentally active, volunteering, writing books, and making sculptures. They have tried separating their medications as potential cause of the balance issues, but these have not helped. The patient does have allergies and has had fluid in their ears, but denies any current ear pressure or pain. They note that the balance issues can occur when rolling over in bed or turning quickly in the shower. The patient denies any unusual headaches, chest pain, shortness of breath, vision changes, or weakness on one side. They do note some fatigue, but attribute this to allergies.             ROS All review of systems negative except what is listed in the HPI      Objective:    Ht 5\' 11"  (1.803 m)   Wt 169 lb (76.7 kg)   SpO2 98%   BMI 23.57 kg/m   Orthostatic VS for the past 24 hrs:  BP- Lying Pulse- Lying BP- Sitting Pulse- Sitting BP- Standing at 0 minutes Pulse- Standing at 0 minutes  08/14/22 0924 114/74 57 111/69 60 95/62 64        Physical Exam Vitals reviewed.  Constitutional:       Appearance: Normal appearance.  HENT:     Head: Normocephalic and atraumatic.     Left Ear: There is impacted cerumen.     Ears:     Comments: Negative Dix Hallpike bilaterally     Mouth/Throat:     Mouth: Mucous membranes are moist.     Pharynx: Oropharynx is clear.  Eyes:     Extraocular Movements: Extraocular movements intact.     Conjunctiva/sclera: Conjunctivae normal.     Pupils: Pupils are equal, round, and reactive to light.  Cardiovascular:     Rate and Rhythm: Normal rate and regular rhythm.     Pulses: Normal pulses.     Heart sounds: Normal heart sounds.  Pulmonary:     Effort: Pulmonary effort is normal.     Breath sounds: Normal breath sounds.  Abdominal:     General: Abdomen is flat.     Palpations: Abdomen is soft.  Musculoskeletal:        General: Normal range of motion.     Cervical back: Normal range of motion and neck supple.  Skin:    General: Skin is warm and dry.     Capillary Refill: Capillary refill takes less than 2 seconds.  Neurological:     General: No focal deficit present.     Mental Status: He is alert and oriented to person,  place, and time.     Cranial Nerves: No cranial nerve deficit.     Sensory: No sensory deficit.     Motor: No weakness.     Coordination: Coordination normal.     Gait: Gait normal.  Psychiatric:        Mood and Affect: Mood normal.        Behavior: Behavior normal.        Thought Content: Thought content normal.        Judgment: Judgment normal.     No results found for any visits on 08/14/22.      Assessment & Plan:   Problem List Items Addressed This Visit     Hypothyroid    Previously well controlled Continue current medication Recheck TSH and adjust meds as indicated       Relevant Orders   TSH   Type 2 diabetes mellitus with hyperlipidemia ([name]) - Primary    Previously stable A1c with lifestyle measures only Rechecking today Continue heart healthy, low-carb diet and regular exercise       Relevant Orders   Microalbumin / creatinine urine ratio   Hemoglobin A1c   Comprehensive metabolic panel   Other Visit Diagnoses     Balance problems     Blood pressure with mild drop on orthostatic vitals - make sure you are staying well hydrated and changing positions slowly Left ear with impaction which we cleaned out today. We will update your labs today Neuro exam was reassuring  Please contact office for follow-up if symptoms do not improve or worsen. Seek emergency care if symptoms become severe.   Relevant Orders   Comprehensive metabolic panel   Impacted cerumen of left ear     Indication: Cerumen impaction of the ear(s)  Medical necessity statement: On physical examination, cerumen impairs clinically significant portions of the external auditory canal, and tympanic membrane. Noted obstructive, copious cerumen that cannot be removed without magnification and instrumentations requiring professional removal.   Consent: Discussed benefits and risks of procedure and verbal consent obtained  Procedure: Patient was prepped for the procedure. Otoscope utilized to assess and take note of the ear canal, the tympanic membrane, and the presence, amount, and placement of the cerumen.  Gentle irrigation with water at body temperature and soft plastic curette utilized to remove impacted cerumen.  Excess water drained by gravity and ear canal(s) dried with clean guaze.  Post procedure examination: Otoscopic examination reveals complete cerumen removal with no damage to the auditory canal, tympanic membrane, or surrounding tissue.  Patient tolerated procedure well.   Post procedure instructions: Patient made aware that they may experience temporary vertigo, temporary changes in hearing, and temporary discomfort. If these symptom last for more than 24 hours to call the clinic or proceed to the ED for further evaluation. Discussed avoiding placing objects into the ear canal for cleaning.          No orders of the defined types were placed in this encounter.   Return in about 3 months (around 11/14/2022) for routine follow-up.  Clayborne Dana, NP

## 2022-08-14 NOTE — Assessment & Plan Note (Signed)
Previously stable A1c with lifestyle measures only Rechecking today Continue heart healthy, low-carb diet and regular exercise

## 2022-08-14 NOTE — Assessment & Plan Note (Signed)
Previously well controlled Continue current medication Recheck TSH and adjust meds as indicated

## 2022-08-15 LAB — POCT INR: INR: 2.3 (ref 2.0–3.0)

## 2022-08-21 ENCOUNTER — Ambulatory Visit: Payer: Medicare PPO | Admitting: Family Medicine

## 2022-08-29 DIAGNOSIS — T1512XA Foreign body in conjunctival sac, left eye, initial encounter: Secondary | ICD-10-CM | POA: Diagnosis not present

## 2022-08-29 NOTE — Progress Notes (Unsigned)
[facility] [address] [place] [address] Phone: [phone] Subjective:   Bruce Donath, am serving as a scribe for Dr. Antoine Primas.  I'm seeing this patient by the request  of:  [name], MD  CC: Left arm pain  [id number]  Paul Ryan is a 77 y.o. male coming in with complaint of L elbow pain. Seen for R elbow pain in 2021. Patient states that his L elbow and wrist have been painful. Feels pull in in lateral elbow over epicondyle as well as pain in wrist. Weak when trying to grip.        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   Hyperglycemia 12/26/2014   Hyperlipidemia    Hypothyroid 06/10/2011   Insomnia 04/24/2016   Left hip pain 12/17/2010   MCI (mild cognitive impairment) 07/22/2016   Nasal lesion 07/22/2016   Polymyalgia ([name]) 03/21/2014   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   Preventative health care 12/17/2010   Shingles 1982   right abdominal wall   Staph skin infection 07/22/2016   Thyroid nodule 11/29/2014   Urinary hesitancy 09/30/2016   Vitamin D deficiency 10/14/2011   Past Surgical History:  Procedure Laterality Date   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   Social History   Socioeconomic History   Marital status: Married    Spouse name: Not on file   Number of children: Not on file   Years of education: Not on file   Highest education level: Not on file  Occupational History   Not on file  Tobacco Use   Smoking status: Former    Packs/day: 1.00    Years: 20.00    Additional pack years: 0.00    Total pack years: 20.00     Types: Cigarettes    Quit date: 03/03/1980    Years since quitting: 42.5   Smokeless tobacco: Never  Substance and Sexual Activity   Alcohol use: No    Alcohol/week: 0.0 standard drinks of alcohol   Drug use: No   Sexual activity: Not on file  Other Topics Concern   Not on file  Social History Narrative   Not on file   Social Determinants of Health   Financial Resource Strain: Low Risk  (02/19/2022)   Overall Financial Resource Strain (CARDIA)    Difficulty of Paying Living Expenses: Not hard at all  Food Insecurity: No Food Insecurity (02/19/2022)   Hunger Vital Sign    Worried About Running Out of Food in the Last Year: Never true    Ran Out of Food in the Last Year: Never true  Transportation Needs: No Transportation Needs (02/19/2022)   PRAPARE - Administrator, Civil Service (Medical): No    Lack of Transportation (Non-Medical): No  Physical Activity: Sufficiently Active (02/19/2022)   Exercise Vital Sign    Days of Exercise per Week: 7 days    Minutes of Exercise per Session: 50 min  Stress: No Stress Concern Present (02/19/2022)   [employer] of Occupational Health - Occupational Stress Questionnaire  Feeling of Stress : Not at all  Social Connections: Moderately Isolated (02/19/2022)   Social Connection and Isolation Panel [NHANES]    Frequency of Communication with Friends and Family: More than three times a week    Frequency of Social Gatherings with Friends and Family: More than three times a week    Attends Religious Services: Never    Database administrator or Organizations: No    Attends Engineer, structural: Never    Marital Status: Married   No Known Allergies Family History  Problem Relation Age of Onset   Cancer Mother        unknown- everywhere   Cancer Sister        breast    Current Outpatient Medications (Endocrine & Metabolic):    predniSONE (DELTASONE) 1 MG tablet, TAKE 1 TABLET BY MOUTH EVERY DAY WITH BREAKFAST    thyroid (ARMOUR THYROID) 60 MG tablet, TAKE ONE TABLET BY MOUTH ON MONDAY, WEDNESDAY, THURSDAY, FRIDAY, AND SUNDAY.  Current Outpatient Medications (Cardiovascular):    rosuvastatin (CRESTOR) 10 MG tablet, Take 1 tablet (10 mg total) by mouth daily.    Current Outpatient Medications (Hematological):    warfarin (COUMADIN) 4 MG tablet, TAKE 1 TABLET BY MOUTH EVERY DAY  Current Outpatient Medications (Other):    Ascorbic Acid (VITAMIN C ER PO), Take by mouth daily.   Blood Glucose Monitoring Suppl (ONETOUCH VERIO FLEX SYSTEM) w/Device KIT, Use to check blood sugar once a day. DX E11.9   Cholecalciferol 4000 UNITS TABS, Take 1 tablet by mouth daily.   DULoxetine (CYMBALTA) 30 MG capsule, Take 30 mg by mouth 2 (two) times daily.   DULoxetine (CYMBALTA) 60 MG capsule, Take 60 mg by mouth daily. 60 mg at night   gabapentin (NEURONTIN) 100 MG capsule, Take 2 capsules (200 mg total) by mouth at bedtime.   glucose blood (ONETOUCH VERIO) test strip, Use to check blood sugar . E11.9   Lancets (ONETOUCH DELICA PLUS LANCET33G) MISC, Use as directed to check sugar.E11.9   NON FORMULARY, AHCC immune support   pantoprazole (PROTONIX) 40 MG tablet, TAKE 1 TABLET BY MOUTH EVERY DAY   Reviewed prior external information including notes and imaging from  primary care provider As well as notes that were available from care everywhere and other healthcare systems.  Past medical history, social, surgical and family history all reviewed in electronic medical record.  No pertanent information unless stated regarding to the chief complaint.   Review of Systems:  No headache, visual changes, nausea, vomiting, diarrhea, constipation, dizziness, abdominal pain, skin rash, fevers, chills, night sweats, weight loss, swollen lymph nodes, body aches, joint swelling, chest pain, shortness of breath, mood changes. POSITIVE muscle aches  Objective  Blood pressure 104/76, pulse 64, height 5\' 11"  (1.803 m), weight 169 lb  (76.7 kg), SpO2 99 %.   General: No apparent distress alert and oriented x3 mood and affect normal, dressed appropriately.  HEENT: Pupils equal, extraocular movements intact  Respiratory: Patient's speak in full sentences and does not appear short of breath  Cardiovascular: No lower extremity edema, non tender, no erythema   Left hand shows the post amputation of the index finger. Right thumb does have very mild swelling of the CMC joint.  Positive Finkelstein's test noted.  Negative Tinel sign.  No pain over the elbow itself.  Does have limited range of motion of the neck noted though.  Procedure: Real-time Ultrasound Guided Injection of left Abductor pollicis longs tendon sheath Device: GE Logiq  Q7  Ultrasound guided injection is preferred based studies that show increased duration, increased effect, greater accuracy, decreased procedural pain, increased response rate with ultrasound guided versus blind injection.  Verbal informed consent obtained.  Time-out conducted.  Noted no overlying erythema, induration, or other signs of local infection.  Skin prepped in a sterile fashion.  Local anesthesia: Topical Ethyl chloride.  With sterile technique and under real time ultrasound guidance:  tendon visualized.  23g 5/8 inch needle inserted distal to proximal approach into tendon sheath. Pictures taken  for needle placement. Patient did have injection of 0.5 cc of of 0.5% Marcaine, and 0.5 cc of Kenalog 40 mg/dL. Completed without difficulty  Pain immediately resolved suggesting accurate placement of the medication.  Advised to call if fevers/chills, erythema, induration, drainage, or persistent bleeding.  Impression: Technically successful ultrasound guided injection.    Impression and Recommendations:     The above documentation has been reviewed and is accurate and complete Judi Saa, DO

## 2022-09-01 ENCOUNTER — Ambulatory Visit: Payer: Medicare PPO | Admitting: Family Medicine

## 2022-09-01 ENCOUNTER — Other Ambulatory Visit: Payer: Self-pay

## 2022-09-01 ENCOUNTER — Encounter: Payer: Self-pay | Admitting: Family Medicine

## 2022-09-01 ENCOUNTER — Ambulatory Visit (INDEPENDENT_AMBULATORY_CARE_PROVIDER_SITE_OTHER): Payer: Medicare PPO

## 2022-09-01 VITALS — BP 104/76 | HR 64 | Ht 71.0 in | Wt 169.0 lb

## 2022-09-01 DIAGNOSIS — M25522 Pain in left elbow: Secondary | ICD-10-CM

## 2022-09-01 DIAGNOSIS — M25422 Effusion, left elbow: Secondary | ICD-10-CM | POA: Diagnosis not present

## 2022-09-01 DIAGNOSIS — M25532 Pain in left wrist: Secondary | ICD-10-CM | POA: Diagnosis not present

## 2022-09-01 DIAGNOSIS — G8929 Other chronic pain: Secondary | ICD-10-CM | POA: Diagnosis not present

## 2022-09-01 DIAGNOSIS — M5030 Other cervical disc degeneration, unspecified cervical region: Secondary | ICD-10-CM | POA: Diagnosis not present

## 2022-09-01 DIAGNOSIS — M542 Cervicalgia: Secondary | ICD-10-CM | POA: Diagnosis not present

## 2022-09-01 DIAGNOSIS — M1812 Unilateral primary osteoarthritis of first carpometacarpal joint, left hand: Secondary | ICD-10-CM | POA: Diagnosis not present

## 2022-09-01 DIAGNOSIS — M654 Radial styloid tenosynovitis [de Quervain]: Secondary | ICD-10-CM

## 2022-09-01 DIAGNOSIS — M79602 Pain in left arm: Secondary | ICD-10-CM | POA: Diagnosis not present

## 2022-09-01 DIAGNOSIS — M898X8 Other specified disorders of bone, other site: Secondary | ICD-10-CM | POA: Diagnosis not present

## 2022-09-01 MED ORDER — GABAPENTIN 100 MG PO CAPS: 200.0000 mg | ORAL_CAPSULE | Freq: Every day | ORAL | 0 refills | Status: DC

## 2022-09-01 NOTE — Patient Instructions (Addendum)
Xray today Injection  Thumb spica day and night until you leave nighty there after Gabapentin 200mg  at night See me again in 6 weeks

## 2022-09-01 NOTE — Assessment & Plan Note (Signed)
Patient given injection and tolerated the procedure well, discussed icing regimen and home exercises.  Discussed which activities to do and which ones to avoid.  Discussed bracing day and night for 1 week and then nightly thereafter.  Patient will be traveling and would like to see him again in 5 to 6 weeks.

## 2022-09-01 NOTE — Assessment & Plan Note (Signed)
Patient does have neurologic aspect that I am concerned that some of this could be secondary to a pinched nerve in the neck.  Start gabapentin 200 at night.  Should do relatively well.  Follow-up again in 5 to 6 weeks

## 2022-09-04 ENCOUNTER — Other Ambulatory Visit: Payer: Self-pay | Admitting: Family Medicine

## 2022-09-04 ENCOUNTER — Other Ambulatory Visit: Payer: Self-pay | Admitting: Cardiology

## 2022-09-11 ENCOUNTER — Encounter: Payer: Self-pay | Admitting: Oncology

## 2022-09-25 ENCOUNTER — Ambulatory Visit: Payer: Medicare PPO | Admitting: Physician Assistant

## 2022-09-25 ENCOUNTER — Encounter: Payer: Self-pay | Admitting: Physician Assistant

## 2022-09-25 VITALS — BP 106/74 | HR 71 | Temp 98.2°F | Resp 20 | Ht 72.0 in | Wt 165.0 lb

## 2022-09-25 DIAGNOSIS — R051 Acute cough: Secondary | ICD-10-CM | POA: Diagnosis not present

## 2022-09-25 MED ORDER — BENZONATATE 100 MG PO CAPS
100.0000 mg | ORAL_CAPSULE | Freq: Two times a day (BID) | ORAL | 0 refills | Status: DC | PRN
Start: 2022-09-25 — End: 2023-06-08

## 2022-09-25 NOTE — Progress Notes (Signed)
Established patient visit   Patient: Paul Ryan   DOB: 29-Jun-1945   77 y.o. Male  MRN: [id number] Visit Date: 09/25/2022  Today's healthcare provider: Alfredia Ferguson, PA-C   Cc. Cough x 2 weeks  Subjective    HPI  Pt reports 2 weeks of coughing, mainly at night. Some headaches. Denies nasal congestion, body aches, fevers. Reports negative home COVID tests, including today.  Only using [place]s cough drops otc.  Medications: Outpatient Medications Prior to Visit  Medication Sig   Ascorbic Acid (VITAMIN C ER PO) Take by mouth daily.   Blood Glucose Monitoring Suppl (ONETOUCH VERIO FLEX SYSTEM) w/Device KIT Use to check blood sugar once a day. DX E11.9   Cholecalciferol 4000 UNITS TABS Take 1 tablet by mouth daily.   DULoxetine (CYMBALTA) 30 MG capsule Take 30 mg by mouth 2 (two) times daily.   gabapentin (NEURONTIN) 100 MG capsule Take 2 capsules (200 mg total) by mouth at bedtime.   glucose blood (ONETOUCH VERIO) test strip Use to check blood sugar . E11.9   Lancets (ONETOUCH DELICA PLUS LANCET33G) MISC Use as directed to check sugar.E11.9   NON FORMULARY AHCC immune support   pantoprazole (PROTONIX) 40 MG tablet TAKE 1 TABLET BY MOUTH EVERY DAY   predniSONE (DELTASONE) 1 MG tablet TAKE 1 TABLET BY MOUTH EVERY DAY WITH BREAKFAST   rosuvastatin (CRESTOR) 10 MG tablet TAKE 1 TABLET BY MOUTH EVERY DAY   thyroid (ARMOUR THYROID) 60 MG tablet TAKE ONE TABLET BY MOUTH ON MONDAY, WEDNESDAY, THURSDAY, FRIDAY, AND SUNDAY.   warfarin (COUMADIN) 4 MG tablet TAKE 1 TABLET BY MOUTH EVERY DAY   DULoxetine (CYMBALTA) 60 MG capsule Take 60 mg by mouth daily. 60 mg at night   No facility-administered medications prior to visit.       Objective    BP 106/74 (BP Location: Left Arm, Patient Position: Sitting, Cuff Size: Normal)   Pulse 71   Temp 98.2 F (36.8 C) (Other (Comment))   Resp 20   Ht 6' (1.829 m)   Wt 165 lb (74.8 kg)   SpO2 100%   BMI 22.38 kg/m    Physical  Exam Constitutional:      General: He is awake.     Appearance: He is well-developed.  HENT:     Head: Normocephalic.     Right Ear: Tympanic membrane normal.     Left Ear: Tympanic membrane normal.     Mouth/Throat:     Pharynx: Posterior oropharyngeal erythema present. No oropharyngeal exudate.  Eyes:     Conjunctiva/sclera: Conjunctivae normal.  Cardiovascular:     Rate and Rhythm: Normal rate and regular rhythm.     Heart sounds: Normal heart sounds.  Pulmonary:     Effort: Pulmonary effort is normal.     Breath sounds: Normal breath sounds. No wheezing, rhonchi or rales.  Skin:    General: Skin is warm.  Neurological:     Mental Status: He is alert and oriented to person, place, and time.  Psychiatric:        Attention and Perception: Attention normal.        Mood and Affect: Mood normal.        Speech: Speech normal.        Behavior: Behavior is cooperative.     No results found for any visits on 09/25/22.  Assessment & Plan     1. Acute cough Allergic vs post viral Recommending antihistamines otc, rx tessalon for cough  BID If cough persists another week or fever develops, contact office.  - benzonatate (TESSALON) 100 MG capsule; Take 1 capsule (100 mg total) by mouth 2 (two) times daily as needed for cough.  Dispense: 20 capsule; Refill: 0   Return if symptoms worsen or fail to improve.      I, Alfredia Ferguson, PA-C have reviewed all documentation for this visit. The documentation on  09/25/22   for the exam, diagnosis, procedures, and orders are all accurate and complete.    Alfredia Ferguson, PA-C  [facility] [phone] (phone) [phone] (fax)  [facility]

## 2022-09-29 ENCOUNTER — Encounter: Payer: Self-pay | Admitting: Family Medicine

## 2022-09-29 ENCOUNTER — Ambulatory Visit: Payer: Medicare PPO | Admitting: Family Medicine

## 2022-09-29 VITALS — BP 119/72 | HR 67 | Temp 98.4°F | Ht 72.0 in | Wt 164.0 lb

## 2022-09-29 DIAGNOSIS — Z7901 Long term (current) use of anticoagulants: Secondary | ICD-10-CM | POA: Diagnosis not present

## 2022-09-29 DIAGNOSIS — D6859 Other primary thrombophilia: Secondary | ICD-10-CM | POA: Diagnosis not present

## 2022-09-29 DIAGNOSIS — M353 Polymyalgia rheumatica: Secondary | ICD-10-CM | POA: Diagnosis not present

## 2022-09-29 DIAGNOSIS — J018 Other acute sinusitis: Secondary | ICD-10-CM

## 2022-09-29 DIAGNOSIS — R051 Acute cough: Secondary | ICD-10-CM

## 2022-09-29 DIAGNOSIS — M159 Polyosteoarthritis, unspecified: Secondary | ICD-10-CM | POA: Diagnosis not present

## 2022-09-29 DIAGNOSIS — E559 Vitamin D deficiency, unspecified: Secondary | ICD-10-CM | POA: Diagnosis not present

## 2022-09-29 DIAGNOSIS — M659 Synovitis and tenosynovitis, unspecified: Secondary | ICD-10-CM | POA: Diagnosis not present

## 2022-09-29 DIAGNOSIS — R5382 Chronic fatigue, unspecified: Secondary | ICD-10-CM | POA: Diagnosis not present

## 2022-09-29 DIAGNOSIS — M25551 Pain in right hip: Secondary | ICD-10-CM | POA: Diagnosis not present

## 2022-09-29 DIAGNOSIS — M542 Cervicalgia: Secondary | ICD-10-CM | POA: Diagnosis not present

## 2022-09-29 MED ORDER — AMOXICILLIN-POT CLAVULANATE 875-125 MG PO TABS
1.0000 | ORAL_TABLET | Freq: Two times a day (BID) | ORAL | 0 refills | Status: AC
Start: 2022-09-29 — End: 2022-10-06

## 2022-09-29 MED ORDER — HYDROCOD POLI-CHLORPHE POLI ER 10-8 MG/5ML PO SUER
5.0000 mL | Freq: Two times a day (BID) | ORAL | 0 refills | Status: AC | PRN
Start: 2022-09-29 — End: 2022-10-04

## 2022-09-29 NOTE — Progress Notes (Signed)
Acute Office Visit  Subjective:     Patient ID: Paul Ryan, male    DOB: 1946/01/21, 77 y.o.   MRN: [id number]  Chief Complaint  Patient presents with   Cough     Patient is in today for cough, sinus pressure.   Discussed the use of AI scribe software for clinical note transcription with the patient, who gave verbal consent to proceed.  History of Present Illness   The patient presents with a persistent cough of approximately three weeks duration. Despite treatment with Mucinex, Delsym, and Tessalon, the cough remains unimproved and is described as involuntary and severe. The cough is productive, with clear to slightly yellow phlegm. The patient reports associated chest discomfort during coughing episodes, but denies any baseline chest pain or dyspnea.  The patient also reports a frontal headache and sinus pressure, which have not been managed with analgesics due to concurrent medication use. The patient initially attributed the symptoms to allergies, as they began with sneezing. However, the symptoms have since progressed to include cough, phlegm production, headache, and sinus pressure. The patient also reports a recent return of nasal drainage, which had previously improved.   The patient's symptoms have resulted in sleep disturbances due to the severity of the cough. The patient also reports a sore throat, which they attribute to postnasal drainage. The patient denies any recent antibiotic use. States he did have a few days last week of feeling slightly improved, but is now feeling worse again.            All review of systems negative except what is listed in the HPI      Objective:    BP 119/72   Pulse 67   Temp 98.4 F (36.9 C) (Paul)   Ht 6' (1.829 m)   Wt 164 lb (74.4 kg)   SpO2 100%   BMI 22.24 kg/m    Physical Exam Constitutional:      General: He is awake. He is not in acute distress.    Appearance: Normal appearance. He is well-developed.  HENT:      Head: Normocephalic.     Right Ear: Tympanic membrane normal.     Left Ear: Tympanic membrane normal.     Nose: Congestion present.     Mouth/Throat:     Pharynx: Posterior oropharyngeal erythema present. No oropharyngeal exudate.     Comments: Cobblestoning/PND Eyes:     Conjunctiva/sclera: Conjunctivae normal.  Cardiovascular:     Rate and Rhythm: Normal rate and regular rhythm.     Heart sounds: Normal heart sounds.  Pulmonary:     Effort: Pulmonary effort is normal.     Breath sounds: Normal breath sounds. No wheezing, rhonchi or rales.  Musculoskeletal:     Cervical back: Normal range of motion and neck supple. No tenderness.  Lymphadenopathy:     Cervical: No cervical adenopathy.  Skin:    General: Skin is warm.  Neurological:     Mental Status: He is alert and oriented to person, place, and time.  Psychiatric:        Attention and Perception: Attention normal.        Mood and Affect: Mood normal.        Speech: Speech normal.        Behavior: Behavior normal. Behavior is cooperative.     No results found for any visits on 09/29/22.      Assessment & Plan:   Problem List Items Addressed This Visit   None  Visit Diagnoses     Acute cough    -  Primary   Relevant Medications   chlorpheniramine-HYDROcodone (TUSSIONEX) 10-8 MG/5ML   amoxicillin-clavulanate (AUGMENTIN) 875-125 MG tablet   Acute non-recurrent sinusitis of other sinus       Relevant Medications   chlorpheniramine-HYDROcodone (TUSSIONEX) 10-8 MG/5ML   amoxicillin-clavulanate (AUGMENTIN) 875-125 MG tablet      Upper Respiratory Infection: Persistent cough for three weeks with clear to slightly yellow phlegm. No chest pain or trouble breathing. Noted sinus pressure and frontal headache. Initial improvement with Tessalon, Mucinex, and Flonase, but symptoms worsened again. -Start Augmentin. -Continue Tessalon, Mucinex, and Flonase. -Add an allergy pill (Zyrtec or Claritin) to help with the  drainage. -If symptoms persist after antibiotics, follow-up. -Add nighttime cough syrup with hydrocodone for bedtime use.      Meds ordered this encounter  Medications   chlorpheniramine-HYDROcodone (TUSSIONEX) 10-8 MG/5ML    Sig: Take 5 mLs by mouth every 12 (twelve) hours as needed for up to 5 days.    Dispense:  50 mL    Refill:  0    Order Specific Question:   Supervising Provider    Answer:   Danise Edge A [[id number]]   amoxicillin-clavulanate (AUGMENTIN) 875-125 MG tablet    Sig: Take 1 tablet by mouth 2 (two) times daily for 7 days.    Dispense:  14 tablet    Refill:  0    Order Specific Question:   Supervising Provider    Answer:   Danise Edge A [[id number]]    Return if symptoms worsen or fail to improve.  Clayborne Dana, NP

## 2022-10-03 DIAGNOSIS — H04123 Dry eye syndrome of bilateral lacrimal glands: Secondary | ICD-10-CM | POA: Diagnosis not present

## 2022-10-03 DIAGNOSIS — M542 Cervicalgia: Secondary | ICD-10-CM | POA: Diagnosis not present

## 2022-10-03 DIAGNOSIS — Z961 Presence of intraocular lens: Secondary | ICD-10-CM | POA: Diagnosis not present

## 2022-10-13 DIAGNOSIS — G629 Polyneuropathy, unspecified: Secondary | ICD-10-CM | POA: Diagnosis not present

## 2022-10-13 NOTE — Progress Notes (Unsigned)
[facility] [address] [place] [address] Phone: [phone] Subjective:   Paul Ryan, am serving as a scribe for Dr. Antoine Primas.  I'm seeing this patient by the request  of:  [name], MD  CC: left elbow pain follow up   [id number]  09/01/2022 Patient does have neurologic aspect that I am concerned that some of this could be secondary to a pinched nerve in the neck.  Start gabapentin 200 at night.  Should do relatively well.  Follow-up again in 5 to 6 weeks     Patient given injection and tolerated the procedure well, discussed icing regimen and home exercises. Discussed which activities to do and which ones to avoid. Discussed bracing day and night for 1 week and then nightly thereafter. Patient will be traveling and would like to see him again in 5 to 6 weeks.   Update 10/14/2022 Paul Ryan is a 77 y.o. male coming in with complaint of L elbow pain.  Was having impingement that was concerning for more of the gabapentin.  Patient states that his elbow did great during his project.   When seated his R hip hurts over lateral aspect. With movement his pain subsided.   Also notices crunching in R knee and pain with lateral movements. Pain occurring in anterior aspect of knee.          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   Hyperglycemia 12/26/2014   Hyperlipidemia    Hypothyroid 06/10/2011   Insomnia 04/24/2016   Left hip pain 12/17/2010   MCI (mild cognitive impairment) 07/22/2016   Nasal lesion 07/22/2016   Polymyalgia ([name]) 03/21/2014   Positive QuantiFERON-TB Gold test  04/01/2015   Preventative health care 12/17/2010   Shingles 1982   right abdominal wall   Staph skin infection 07/22/2016   Thyroid nodule 11/29/2014   Urinary hesitancy 09/30/2016   Vitamin D deficiency 10/14/2011   Past Surgical History:  Procedure Laterality Date   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   Social History   Socioeconomic History   Marital status: Married    Spouse name: Not on file   Number of children: Not on file   Years of education: Not on file   Highest education level: Not on file  Occupational History   Not on file  Tobacco Use   Smoking status: Former    Current packs/day: 0.00    Average packs/day: 1 pack/day for 20.0 years (20.0 ttl pk-yrs)    Types: Cigarettes    Start date: 03/03/1960    Quit date: 03/03/1980    Years since quitting: 42.6   Smokeless tobacco: Never  Substance and Sexual Activity   Alcohol use: No    Alcohol/week: 0.0 standard drinks of alcohol   Drug use: No   Sexual activity: Not on file  Other Topics Concern   Not on file  Social History Narrative   Not on file   Social Determinants of Health   Financial Resource Strain: Low Risk  (02/19/2022)   Overall Physicist, medical Strain (  CARDIA)    Difficulty of Paying Living Expenses: Not hard at all  Food Insecurity: No Food Insecurity (02/19/2022)   Hunger Vital Sign    Worried About Running Out of Food in the Last Year: Never true    Ran Out of Food in the Last Year: Never true  Transportation Needs: No Transportation Needs (02/19/2022)   PRAPARE - Administrator, Civil Service (Medical): No    Lack of Transportation (Non-Medical): No  Physical Activity: Sufficiently Active (02/19/2022)   Exercise Vital Sign    Days of Exercise per Week: 7 days    Minutes of Exercise per Session: 50 min  Stress: No Stress Concern Present (02/19/2022)   [employer] of Occupational Health - Occupational Stress Questionnaire    Feeling of Stress : Not at all   Social Connections: Moderately Isolated (02/19/2022)   Social Connection and Isolation Panel [NHANES]    Frequency of Communication with Friends and Family: More than three times a week    Frequency of Social Gatherings with Friends and Family: More than three times a week    Attends Religious Services: Never    Database administrator or Organizations: No    Attends Engineer, structural: Never    Marital Status: Married   No Known Allergies Family History  Problem Relation Age of Onset   Cancer Mother        unknown- everywhere   Cancer Sister        breast    Current Outpatient Medications (Endocrine & Metabolic):    predniSONE (DELTASONE) 1 MG tablet, TAKE 1 TABLET BY MOUTH EVERY DAY WITH BREAKFAST   thyroid (ARMOUR THYROID) 60 MG tablet, TAKE ONE TABLET BY MOUTH ON MONDAY, WEDNESDAY, THURSDAY, FRIDAY, AND SUNDAY.  Current Outpatient Medications (Cardiovascular):    rosuvastatin (CRESTOR) 10 MG tablet, TAKE 1 TABLET BY MOUTH EVERY DAY  Current Outpatient Medications (Respiratory):    benzonatate (TESSALON) 100 MG capsule, Take 1 capsule (100 mg total) by mouth 2 (two) times daily as needed for cough.   Current Outpatient Medications (Hematological):    warfarin (COUMADIN) 4 MG tablet, TAKE 1 TABLET BY MOUTH EVERY DAY  Current Outpatient Medications (Other):    Ascorbic Acid (VITAMIN C ER PO), Take by mouth daily.   Blood Glucose Monitoring Suppl (ONETOUCH VERIO FLEX SYSTEM) w/Device KIT, Use to check blood sugar once a day. DX E11.9   Cholecalciferol 4000 UNITS TABS, Take 1 tablet by mouth daily.   DULoxetine (CYMBALTA) 30 MG capsule, Take 30 mg by mouth 2 (two) times daily.   gabapentin (NEURONTIN) 100 MG capsule, Take 2 capsules (200 mg total) by mouth at bedtime.   glucose blood (ONETOUCH VERIO) test strip, Use to check blood sugar . E11.9   Lancets (ONETOUCH DELICA PLUS LANCET33G) MISC, Use as directed to check sugar.E11.9   NON FORMULARY, AHCC immune  support   pantoprazole (PROTONIX) 40 MG tablet, TAKE 1 TABLET BY MOUTH EVERY DAY   DULoxetine (CYMBALTA) 60 MG capsule, Take 60 mg by mouth daily. 60 mg at night   Reviewed prior external information including notes and imaging from  primary care provider As well as notes that were available from care everywhere and other healthcare systems.  Past medical history, social, surgical and family history all reviewed in electronic medical record.  No pertanent information unless stated regarding to the chief complaint.   Review of Systems:  No headache, visual changes, nausea, vomiting, diarrhea, constipation, dizziness, abdominal pain, skin  rash, fevers, chills, night sweats, weight loss, swollen lymph nodes, body aches, joint swelling, chest pain, shortness of breath, mood changes. POSITIVE muscle aches  Objective  Blood pressure 106/72, pulse 86, height 6' (1.829 m), weight 164 lb (74.4 kg), SpO2 96%.   General: No apparent distress alert and oriented x3 mood and affect normal, dressed appropriately.  HEENT: Pupils equal, extraocular movements intact  Respiratory: Patient's speak in full sentences and does not appear short of breath  Cardiovascular: No lower extremity edema, non tender, no erythema   Neck exam shows does have some mild loss lordosis but nothing severe.  Arm exam shows elbow is doing very well overall.  Right greater trochanteric area is tender to palpation.  Patient's right knee does have some tenderness to palpation minorly over the patellofemoral joint.  No significant instability.  Does have some crepitus noted.    Impression and Recommendations:    The above documentation has been reviewed and is accurate and complete Judi Saa, DO

## 2022-10-14 ENCOUNTER — Ambulatory Visit: Payer: Medicare PPO | Admitting: Family Medicine

## 2022-10-14 ENCOUNTER — Other Ambulatory Visit: Payer: Self-pay

## 2022-10-14 VITALS — BP 106/72 | HR 86 | Ht 72.0 in | Wt 164.0 lb

## 2022-10-14 DIAGNOSIS — M25522 Pain in left elbow: Secondary | ICD-10-CM | POA: Diagnosis not present

## 2022-10-14 DIAGNOSIS — M7061 Trochanteric bursitis, right hip: Secondary | ICD-10-CM | POA: Diagnosis not present

## 2022-10-14 DIAGNOSIS — M1711 Unilateral primary osteoarthritis, right knee: Secondary | ICD-10-CM

## 2022-10-14 DIAGNOSIS — G609 Hereditary and idiopathic neuropathy, unspecified: Secondary | ICD-10-CM | POA: Diagnosis not present

## 2022-10-14 MED ORDER — CYANOCOBALAMIN 1000 MCG/ML IJ SOLN
1000.0000 ug | Freq: Once | INTRAMUSCULAR | Status: AC
Start: 2022-10-14 — End: 2022-10-14
  Administered 2022-10-14: 1000 ug via INTRAMUSCULAR

## 2022-10-14 NOTE — Assessment & Plan Note (Signed)
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  Given rehab exercises handout for VMO, hip abductors, core, entire kinetic chain including proprioception exercises.  Could benefit from PT, regular exercise, upright biking, and a PFS knee brace to assist with tracking abnormalities.  

## 2022-10-14 NOTE — Assessment & Plan Note (Signed)
Patient previously had some difficulty with this.  Discussed with patient about potential injections with patient declined.  Given hip abductor strengthening exercises which I think will be beneficial.  Will follow-up with me again in 6 to 8 weeks otherwise

## 2022-10-14 NOTE — Patient Instructions (Addendum)
B12 injection Take B12 and 100mg  B6 If injection helps it can be done monthly Do prescribed exercises at least 3x a week  See you again in 2-3 months

## 2022-10-14 NOTE — Assessment & Plan Note (Signed)
Patient had nerve conduction study that did show some mild progression noted.  Discussed with patient about B12 treatment potential and given injection.  And following up with neurology in the near future otherwise.  Last B12 was 268

## 2022-10-18 DIAGNOSIS — I2699 Other pulmonary embolism without acute cor pulmonale: Secondary | ICD-10-CM | POA: Diagnosis not present

## 2022-10-18 DIAGNOSIS — D6859 Other primary thrombophilia: Secondary | ICD-10-CM | POA: Diagnosis not present

## 2022-10-18 DIAGNOSIS — Z7901 Long term (current) use of anticoagulants: Secondary | ICD-10-CM | POA: Diagnosis not present

## 2022-10-20 DIAGNOSIS — G3184 Mild cognitive impairment, so stated: Secondary | ICD-10-CM | POA: Diagnosis not present

## 2022-10-20 DIAGNOSIS — G250 Essential tremor: Secondary | ICD-10-CM | POA: Diagnosis not present

## 2022-10-20 DIAGNOSIS — G609 Hereditary and idiopathic neuropathy, unspecified: Secondary | ICD-10-CM | POA: Diagnosis not present

## 2022-10-30 ENCOUNTER — Encounter: Payer: Self-pay | Admitting: Family Medicine

## 2022-10-31 DIAGNOSIS — M542 Cervicalgia: Secondary | ICD-10-CM | POA: Diagnosis not present

## 2022-11-04 ENCOUNTER — Other Ambulatory Visit: Payer: Self-pay | Admitting: Family Medicine

## 2022-11-11 ENCOUNTER — Encounter: Payer: Self-pay | Admitting: Family Medicine

## 2022-11-12 ENCOUNTER — Other Ambulatory Visit: Payer: Self-pay | Admitting: Oncology

## 2022-11-12 ENCOUNTER — Other Ambulatory Visit: Payer: Self-pay | Admitting: Cardiology

## 2022-11-12 DIAGNOSIS — I82509 Chronic embolism and thrombosis of unspecified deep veins of unspecified lower extremity: Secondary | ICD-10-CM

## 2022-11-13 ENCOUNTER — Encounter: Payer: Self-pay | Admitting: Family Medicine

## 2022-11-13 ENCOUNTER — Ambulatory Visit: Payer: Medicare PPO | Admitting: Family Medicine

## 2022-11-13 VITALS — BP 106/61 | HR 65 | Temp 97.5°F | Ht 72.0 in | Wt 166.0 lb

## 2022-11-13 DIAGNOSIS — H6593 Unspecified nonsuppurative otitis media, bilateral: Secondary | ICD-10-CM

## 2022-11-13 NOTE — Patient Instructions (Addendum)
Prednisone 20 mg for 5 days (he has some at home already), if needed you can taper down to 10 mg for 3 days, then 5 mg for 3 days. Will go ahead and send a referral to ENT in case this doesn't help since you have been struggling with this for long.  Continue Nasacort.

## 2022-11-13 NOTE — Progress Notes (Signed)
   Acute Office Visit  Subjective:     Patient ID: Paul Ryan, male    DOB: [birth year], 77 y.o.   MRN: [id number]  Chief Complaint  Patient presents with   Hearing Loss    HPI Patient is in today for muffled hearing and ear pressure.    Discussed the use of AI scribe software for clinical note transcription with the patient, who gave verbal consent to proceed.  History of Present Illness   The patient, with a history of recent respiratory infection and sinus issues, presents with persistent hearing impairment. They describe the sensation as being 'deaf' in one ear, with muffled hearing and occasional pressure, likening the sensation to being on an airplane. Despite daily use of Mucinex and Nasacort, the patient believes there is residual fluid in the ear. They deny any associated pain.  The patient also reports a very dry cough at night. They have a history of polymyalgia rheumatica (PMR) and have taken prednisone in the past when their PMR symptoms flare. They tolerate prednisone well. 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         ROS All review of systems negative except what is listed in the HPI      Objective:    BP 106/61   Pulse 65   Temp (!) 97.5 F (36.4 C) (Oral)   Ht 6' (1.829 m)   Wt 166 lb (75.3 kg)   SpO2 99%   BMI 22.51 kg/m    Physical Exam Vitals reviewed.  Constitutional:      Appearance: Normal appearance.  HENT:     Head: Normocephalic and atraumatic.     Ears:     Comments: Bilateral middle ear effusions; small amount of dry cerumen to left ear but not impacted Cardiovascular:     Heart sounds: Normal heart sounds.  Pulmonary:     Effort: Pulmonary effort is normal.  Skin:    General: Skin is warm and dry.  Neurological:     Mental Status: He is alert and oriented to person, place, and time.  Psychiatric:        Mood and Affect: Mood normal.        Behavior: Behavior  normal.        Thought Content: Thought content normal.        Judgment: Judgment normal.     No results found for any visits on 11/13/22.      Assessment & Plan:   Problem List Items Addressed This Visit   None Visit Diagnoses     Fluid level behind tympanic membrane of both ears    -  Primary   Relevant Orders   Ambulatory referral to ENT     Prednisone 20 mg for 5 days (he has some at home already), if needed you can taper down to 10 mg for 3 days, then 5 mg for 3 days. Will go ahead and send a referral to ENT in case this doesn't help since you have been struggling with this for long.  Continue Nasacort.   No orders of the defined types were placed in this encounter.   Return if symptoms worsen or fail to improve.  Clayborne Dana, NP

## 2022-11-18 ENCOUNTER — Ambulatory Visit (INDEPENDENT_AMBULATORY_CARE_PROVIDER_SITE_OTHER): Payer: Medicare PPO

## 2022-11-18 DIAGNOSIS — E538 Deficiency of other specified B group vitamins: Secondary | ICD-10-CM

## 2022-11-18 MED ORDER — CYANOCOBALAMIN 1000 MCG/ML IJ SOLN
1000.0000 ug | Freq: Once | INTRAMUSCULAR | Status: AC
Start: 2022-11-18 — End: 2022-11-18
  Administered 2022-11-18: 1000 ug via INTRAMUSCULAR

## 2022-11-18 NOTE — Progress Notes (Signed)
Patient received B12 injection Per Dr. Katrinka Blazing. Patent tolerated injection well

## 2022-11-20 ENCOUNTER — Encounter: Payer: Self-pay | Admitting: Oncology

## 2022-11-21 DIAGNOSIS — M542 Cervicalgia: Secondary | ICD-10-CM | POA: Diagnosis not present

## 2022-12-08 NOTE — Progress Notes (Unsigned)
[facility] [address] [place] [address] Phone: [phone] Subjective:   Paul Ryan, am serving as a scribe for Dr. Antoine Primas.  I'm seeing this patient by the request  of:  [name], MD  CC: Allover pain, elbow pain, B12 deficiency  FUX:[id number]  10/14/2022 Patient had nerve conduction study that did show some mild progression noted.  Discussed with patient about B12 treatment potential and given injection.  And following up with neurology in the near future otherwise.  Last B12 was 268       Paul Ryan is a 77 y.o. male coming in with complaint of neuropathy. Follow up on B12. Everything from last time doing great. No new concerns.  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   Hyperglycemia 12/26/2014   Hyperlipidemia    Hypothyroid 06/10/2011   Insomnia 04/24/2016   Left hip pain 12/17/2010   MCI (mild cognitive impairment) 07/22/2016   Nasal lesion 07/22/2016   Polymyalgia ([name]) 03/21/2014   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   Preventative health care 12/17/2010   Shingles 1982   right abdominal wall   Staph skin infection 07/22/2016   Thyroid nodule 11/29/2014   Urinary hesitancy 09/30/2016   Vitamin D deficiency 10/14/2011   Past Surgical History:  Procedure Laterality Date   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   Social History   Socioeconomic History   Marital status: Married    Spouse name: Not on file   Number of  children: Not on file   Years of education: Not on file   Highest education level: Not on file  Occupational History   Not on file  Tobacco Use   Smoking status: Former    Current packs/day: 0.00    Average packs/day: 1 pack/day for 20.0 years (20.0 ttl pk-yrs)    Types: Cigarettes    Start date: 03/03/1960    Quit date: 03/03/1980    Years since quitting: 42.8   Smokeless tobacco: Never  Substance and Sexual Activity   Alcohol use: No    Alcohol/week: 0.0 standard drinks of alcohol   Drug use: No   Sexual activity: Not on file  Other Topics Concern   Not on file  Social History Narrative   Not on file   Social Determinants of Health   Financial Resource Strain: Low Risk  (02/19/2022)   Overall Financial Resource Strain (CARDIA)    Difficulty of Paying Living Expenses: Not hard at all  Food Insecurity: No Food Insecurity (02/19/2022)   Hunger Vital Sign    Worried About Running Out of Food in the Last Year: Never true    Ran Out of Food in the Last Year: Never true  Transportation Needs: No Transportation Needs (02/19/2022)   PRAPARE - Administrator, Civil Service (Medical): No    Lack of Transportation (Non-Medical): No  Physical Activity: Sufficiently Active (02/19/2022)   Exercise Vital Sign  Days of Exercise per Week: 7 days    Minutes of Exercise per Session: 50 min  Stress: No Stress Concern Present (02/19/2022)   [employer] of Occupational Health - Occupational Stress Questionnaire    Feeling of Stress : Not at all  Social Connections: Moderately Isolated (02/19/2022)   Social Connection and Isolation Panel [NHANES]    Frequency of Communication with Friends and Family: More than three times a week    Frequency of Social Gatherings with Friends and Family: More than three times a week    Attends Religious Services: Never    Database administrator or Organizations: No    Attends Engineer, structural: Never    Marital Status: Married    No Known Allergies Family History  Problem Relation Age of Onset   Cancer Mother        unknown- everywhere   Cancer Sister        breast    Current Outpatient Medications (Endocrine & Metabolic):    predniSONE (DELTASONE) 1 MG tablet, TAKE 1 TABLET BY MOUTH EVERY DAY WITH BREAKFAST   thyroid (ARMOUR THYROID) 60 MG tablet, TAKE ONE TABLET BY MOUTH ON MONDAY, WEDNESDAY, THURSDAY, FRIDAY, AND SUNDAY.  Current Outpatient Medications (Cardiovascular):    rosuvastatin (CRESTOR) 10 MG tablet, TAKE 1 TABLET BY MOUTH EVERY DAY  Current Outpatient Medications (Respiratory):    benzonatate (TESSALON) 100 MG capsule, Take 1 capsule (100 mg total) by mouth 2 (two) times daily as needed for cough.   Current Outpatient Medications (Hematological):    warfarin (COUMADIN) 4 MG tablet, TAKE 1 TABLET BY MOUTH EVERY DAY  Current Outpatient Medications (Other):    ACCU-CHEK GUIDE test strip, USE TO CHECK BLOOD SUGAR ONCE DAILY . E11.9   Accu-Chek Softclix Lancets lancets, USE AS DIRECTED TO CHECK SUGAR ONCE DAILY.E11.9   Ascorbic Acid (VITAMIN C ER PO), Take by mouth daily.   Blood Glucose Monitoring Suppl (ONETOUCH VERIO FLEX SYSTEM) w/Device KIT, Use to check blood sugar once a day. DX E11.9   Cholecalciferol 4000 UNITS TABS, Take 1 tablet by mouth daily.   DULoxetine (CYMBALTA) 30 MG capsule, Take 30 mg by mouth 2 (two) times daily.   DULoxetine (CYMBALTA) 60 MG capsule, Take 60 mg by mouth daily. 60 mg at night   gabapentin (NEURONTIN) 100 MG capsule, Take 2 capsules (200 mg total) by mouth at bedtime.   NON FORMULARY, AHCC immune support   pantoprazole (PROTONIX) 40 MG tablet, TAKE 1 TABLET BY MOUTH EVERY DAY   Review of Systems:  No headache, visual changes, nausea, vomiting, diarrhea, constipation, dizziness, abdominal pain, skin rash, fevers, chills, night sweats, weight loss, swollen lymph nodes, body aches, joint swelling, chest pain, shortness of breath, mood changes. POSITIVE muscle  aches very mild with improvement  Objective  Blood pressure 118/84, pulse 70, height 6' (1.829 m), weight 167 lb (75.8 kg), SpO2 96%.   General: No apparent distress alert and oriented x3 mood and affect normal, dressed appropriately.  HEENT: Pupils equal, extraocular movements intact  Respiratory: Patient's speak in full sentences and does not appear short of breath  Cardiovascular: No lower extremity edema, non tender, no erythema  Patient does not have significant neuropathy at the moment.  Able to get out of a sitting position without any difficulty.  Patient does have some tightness noted in the back when he does stand.  Hand exam shows good strength today.    Impression and Recommendations:     The above documentation  has been reviewed and is accurate and complete Judi Saa, DO

## 2022-12-10 ENCOUNTER — Other Ambulatory Visit: Payer: Self-pay | Admitting: Cardiology

## 2022-12-11 ENCOUNTER — Ambulatory Visit: Payer: Medicare PPO | Admitting: Family Medicine

## 2022-12-11 ENCOUNTER — Encounter: Payer: Self-pay | Admitting: Family Medicine

## 2022-12-11 VITALS — BP 118/84 | HR 70 | Ht 72.0 in | Wt 167.0 lb

## 2022-12-11 DIAGNOSIS — E538 Deficiency of other specified B group vitamins: Secondary | ICD-10-CM

## 2022-12-11 LAB — CBC WITH DIFFERENTIAL/PLATELET
Basophils Absolute: 0 10*3/uL (ref 0.0–0.1)
Basophils Relative: 0.3 % (ref 0.0–3.0)
Eosinophils Absolute: 0.2 10*3/uL (ref 0.0–0.7)
Eosinophils Relative: 2.4 % (ref 0.0–5.0)
HCT: 46.7 % (ref 39.0–52.0)
Hemoglobin: 15.1 g/dL (ref 13.0–17.0)
Lymphocytes Relative: 19 % (ref 12.0–46.0)
Lymphs Abs: 1.7 10*3/uL (ref 0.7–4.0)
MCHC: 32.3 g/dL (ref 30.0–36.0)
MCV: 89.4 fL (ref 78.0–100.0)
Monocytes Absolute: 0.6 10*3/uL (ref 0.1–1.0)
Monocytes Relative: 7.3 % (ref 3.0–12.0)
Neutro Abs: 6.2 10*3/uL (ref 1.4–7.7)
Neutrophils Relative %: 71 % (ref 43.0–77.0)
Platelets: 262 10*3/uL (ref 150.0–400.0)
RBC: 5.22 Mil/uL (ref 4.22–5.81)
RDW: 15.3 % (ref 11.5–15.5)
WBC: 8.7 10*3/uL (ref 4.0–10.5)

## 2022-12-11 LAB — VITAMIN B12: Vitamin B-12: 1086 pg/mL — ABNORMAL HIGH (ref 211–911)

## 2022-12-11 LAB — FOLATE: Folate: 13.5 ng/mL (ref >5.9)

## 2022-12-11 MED ORDER — CYANOCOBALAMIN 1000 MCG/ML IJ SOLN
1000.0000 ug | Freq: Once | INTRAMUSCULAR | Status: AC
Start: 2022-12-11 — End: 2022-12-11
  Administered 2022-12-11: 1000 ug via INTRAMUSCULAR

## 2022-12-11 NOTE — Assessment & Plan Note (Signed)
Has had difficulty with B12 and has responded well to the injections in the past.  Has been 2 weeks since his last injection and would like to check labs today.  Patient is feeling better with increasing energy.  Patient will be traveling for the next 5 weeks.  Will give him another injection after the lab draw today and then will follow-up for another 1 in 5 weeks when he returns can follow-up with me as needed for any of his other ailments.

## 2022-12-13 DIAGNOSIS — J029 Acute pharyngitis, unspecified: Secondary | ICD-10-CM | POA: Diagnosis not present

## 2022-12-16 ENCOUNTER — Ambulatory Visit: Payer: Medicare PPO | Admitting: Family Medicine

## 2022-12-24 ENCOUNTER — Encounter: Payer: Self-pay | Admitting: Oncology

## 2023-01-13 DIAGNOSIS — Z7901 Long term (current) use of anticoagulants: Secondary | ICD-10-CM | POA: Diagnosis not present

## 2023-01-13 DIAGNOSIS — I2699 Other pulmonary embolism without acute cor pulmonale: Secondary | ICD-10-CM | POA: Diagnosis not present

## 2023-01-13 DIAGNOSIS — D6859 Other primary thrombophilia: Secondary | ICD-10-CM | POA: Diagnosis not present

## 2023-01-22 ENCOUNTER — Ambulatory Visit (INDEPENDENT_AMBULATORY_CARE_PROVIDER_SITE_OTHER): Payer: Self-pay | Admitting: Plastic Surgery

## 2023-01-22 ENCOUNTER — Ambulatory Visit (INDEPENDENT_AMBULATORY_CARE_PROVIDER_SITE_OTHER): Payer: Medicare PPO | Admitting: Otolaryngology

## 2023-01-22 ENCOUNTER — Encounter (INDEPENDENT_AMBULATORY_CARE_PROVIDER_SITE_OTHER): Payer: Self-pay | Admitting: Otolaryngology

## 2023-01-22 VITALS — BP 129/89 | HR 90 | Ht 71.0 in | Wt 163.0 lb

## 2023-01-22 DIAGNOSIS — J343 Hypertrophy of nasal turbinates: Secondary | ICD-10-CM | POA: Diagnosis not present

## 2023-01-22 DIAGNOSIS — K219 Gastro-esophageal reflux disease without esophagitis: Secondary | ICD-10-CM | POA: Diagnosis not present

## 2023-01-22 DIAGNOSIS — J329 Chronic sinusitis, unspecified: Secondary | ICD-10-CM | POA: Diagnosis not present

## 2023-01-22 DIAGNOSIS — R0981 Nasal congestion: Secondary | ICD-10-CM

## 2023-01-22 DIAGNOSIS — H6990 Unspecified Eustachian tube disorder, unspecified ear: Secondary | ICD-10-CM | POA: Diagnosis not present

## 2023-01-22 DIAGNOSIS — Z719 Counseling, unspecified: Secondary | ICD-10-CM

## 2023-01-22 DIAGNOSIS — R053 Chronic cough: Secondary | ICD-10-CM | POA: Diagnosis not present

## 2023-01-22 DIAGNOSIS — R0982 Postnasal drip: Secondary | ICD-10-CM | POA: Diagnosis not present

## 2023-01-22 DIAGNOSIS — J342 Deviated nasal septum: Secondary | ICD-10-CM

## 2023-01-22 DIAGNOSIS — J3089 Other allergic rhinitis: Secondary | ICD-10-CM

## 2023-01-22 MED ORDER — FLUTICASONE PROPIONATE 50 MCG/ACT NA SUSP: 2.0000 | Freq: Every day | NASAL | 6 refills | Status: DC

## 2023-01-22 MED ORDER — CETIRIZINE HCL 10 MG PO TABS: 10.0000 mg | ORAL_TABLET | Freq: Every day | ORAL | 11 refills | Status: DC

## 2023-01-22 NOTE — Patient Instructions (Signed)
-   start Flonase and Zyrtec for nasal drainage - continue reflux medication - try reflux gourmet - see information below - return if your cough sx will not go away  - Take Reflux Gourmet (natural supplement available on Amazon) to help with symptoms of chronic throat irritation

## 2023-01-22 NOTE — Progress Notes (Signed)
ENT CONSULT:  Reason for Consult: dry cough for several months and muffled hearing/fluid in the ears   HPI: Discussed the use of AI scribe software for clinical note transcription with the patient, who gave verbal consent to proceed.  The patient is a 77 yoM, with a history of sleep apnea/using oral appliance, and acid reflux, presents with a persistent dry cough that has been present for several months. The cough is more pronounced at night and has been associated with waking from sleep. The patient denies any associated fever or flu-like symptoms. There has been some difficulty swallowing, which has improved recently. The patient has been using a nasal corticosteroid spray daily and has been on a course of antibiotics (Amoxicillin) over two months ago, which did not resolve the cough.  The patient also reports that there has been some fluid noted behind the ears when he was seen by PCP, but no other symptoms. The patient has been managing acid reflux with daily Pantoprazole and has had a GI scope in the past with no significant findings per report.  The patient has a history of smoking but quit approximately 35 years ago. Being f/b Pulm with annual CT scans, one currently ordered and pending (had ground glass changes on CT chest 2023).   Records Reviewed:  Visit with PCP 09/29/22 Given Augmentin and cough syrup   Upper Respiratory Infection: Persistent cough for three weeks with clear to slightly yellow phlegm. No chest pain or trouble breathing. Noted sinus pressure and frontal headache. Initial improvement with Tessalon, Mucinex, and Flonase, but symptoms worsened again. -Start Augmentin. -Continue Tessalon, Mucinex, and Flonase. -Add an allergy pill (Zyrtec or Claritin) to help with the drainage. -If symptoms persist after antibiotics, follow-up. -Add nighttime cough syrup with hydrocodone for bedtime use.  Pulm note by Dr Tonia Brooms 07/09/22 This is a 77 year old gentleman, past medical  history of DVT, protein S deficiency, hyperlipidemia, former smoker quit 40+ years ago.  He works as a Psychologist, educational.  He has been a long standing advocate for science and art.  And spends significant amount of time lecturing and traveling.  He had a cardiac scoring CT complete which revealed a small groundglass opacity in the right lung that was 6 mm in size.  Here today for evaluation of this nodule and appropriate neck steps.  We also reviewed his CT imaging today in the office.   OV 07/09/2022:Patient here today for CT scan follow-up for pulmonary nodule.  CT completed on 06/30/2022.  Shows a stable 6 mm groundglass nodule in the right upper lobe.  No respiratory symptoms.  Reviewed CT imaging today in the office.  Chest Imaging: 06/04/2021 CT cardiac scoring. Small 6 mm right-sided groundglass opacity. The patient's images have been independently reviewed by me.      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   Hyperglycemia 12/26/2014   Hyperlipidemia    Hypothyroid 06/10/2011   Insomnia 04/24/2016   Left hip pain 12/17/2010   MCI (mild cognitive impairment) 07/22/2016   Nasal lesion 07/22/2016   Polymyalgia ([name]) 03/21/2014   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 His smoking use included cigarettes. He started smoking about 62 years ago. He has a 20 pack-year smoking history. He has never used smokeless tobacco. He reports that he does not drink alcohol and does not use drugs.  Allergies: No Known Allergies  Medications: I have reviewed the patient's current medications.  The PMH, PSH, Medications, Allergies, and SH were reviewed and updated.  ROS: Constitutional: Negative for fever, weight loss and weight gain. Cardiovascular: Negative for chest pain and dyspnea on exertion. Respiratory: Is not experiencing shortness of breath at rest. Gastrointestinal: Negative for nausea and vomiting. Neurological: Negative for headaches. Psychiatric: The patient is not nervous/anxious  Blood pressure 129/89, pulse 90, height 5\' 11"  (1.803 m), weight 163 lb (73.9 kg), SpO2 96%.  PHYSICAL EXAM:  Exam: General: Well-developed, well-nourished Communication and Voice:weak projection  Respiratory Respiratory effort: Equal inspiration and expiration without stridor Cardiovascular Peripheral Vascular: Warm extremities with equal color/perfusion Eyes: No nystagmus with equal extraocular motion bilaterally Neuro/Psych/Balance: Patient oriented to person, place, and time; Appropriate mood and affect; Gait is intact with no imbalance; Cranial nerves I-XII are intact Head and Face Inspection: Normocephalic and atraumatic without mass or lesion Palpation: Facial skeleton intact without bony stepoffs Salivary Glands: No mass or tenderness Facial Strength: Facial motility symmetric and full bilaterally ENT Pinna: External ear intact and fully developed External canal: Canal is patent with intact skin Tympanic Membrane: Clear and mobile External Nose: No scar or anatomic deformity Internal Nose: Septum is deviated to the left. No polyp, or  purulence. Mucosal edema and erythema present.  Bilateral inferior turbinate hypertrophy.  Lips, Teeth, and gums: Mucosa and teeth intact and viable TMJ: No pain to palpation with full mobility Oral cavity/oropharynx: No erythema or exudate, no lesions present Nasopharynx: No mass or lesion with intact mucosa Hypopharynx: Intact mucosa without pooling of secretions Larynx Glottic: Full true vocal cord mobility without lesion or mass, mild VF atrophy Supraglottic: Normal appearing epiglottis and AE folds Interarytenoid Space: moderate pachydermia edema Subglottic Space: Patent without lesion or edema Neck Neck and Trachea: Midline trachea without mass or lesion Thyroid: No mass or nodularity Lymphatics: No lymphadenopathy  Procedure: Preoperative diagnosis: persistent chronic cough   Postoperative diagnosis:   Same + GERD LPR  Procedure: Flexible fiberoptic laryngoscopy  Surgeon: Ashok Croon, MD  Anesthesia: Topical lidocaine and Afrin Complications: None Condition is stable throughout exam  Indications and consent:  The patient presents to the clinic with Indirect laryngoscopy view was incomplete. Thus it was recommended that they undergo a flexible fiberoptic laryngoscopy. All of the risks, benefits, and potential complications were reviewed with the patient preoperatively and verbal informed consent was obtained.  Procedure: The patient was seated upright in the clinic. Topical lidocaine and Afrin were applied to the nasal cavity. After adequate anesthesia had occurred, I then proceeded to pass the flexible telescope into the nasal cavity. The  nasal cavity was patent without rhinorrhea or polyp. The nasopharynx was also patent without mass or lesion. The base of tongue was visualized and was normal. There were no signs of pooling of secretions in the piriform sinuses. The true vocal folds were mobile bilaterally. There were no signs of glottic or supraglottic mucosal lesion or  mass. There was moderate to severe interarytenoid pachydermia and post cricoid edema. The telescope was then slowly withdrawn and the patient tolerated the procedure throughout.    PROCEDURE NOTE: nasal endoscopy  Preoperative diagnosis: chronic sinusitis symptoms  Postoperative diagnosis: same  Procedure: Diagnostic nasal endoscopy (09811)  Surgeon: Ashok Croon, M.D.  Anesthesia: Topical lidocaine and Afrin  H&P REVIEW: The patient's history and physical were reviewed today prior to procedure. All medications were reviewed and updated as well. Complications: None Condition is stable throughout exam Indications and consent: The patient presents with symptoms of chronic sinusitis not responding to previous therapies. All the risks, benefits, and potential complications were reviewed with the patient preoperatively and informed consent was obtained. The time out was completed with confirmation of the correct procedure.   Procedure: The patient was seated upright in the clinic. Topical lidocaine and Afrin were applied to the nasal cavity. After adequate anesthesia had occurred, the rigid nasal endoscope was passed into the nasal cavity. The nasal mucosa, turbinates, septum, and sinus drainage pathways were visualized bilaterally. This revealed no purulence or significant secretions that might be cultured. There were no polyps or sites of significant inflammation. The mucosa was intact and there was no crusting present. The scope was then slowly withdrawn and the patient tolerated the procedure well. There were no complications or blood loss.    Studies Reviewed: CT chest 06/30/22 FINDINGS: Cardiovascular: No pericardial effusion. Coronary artery calcifications are seen. Normal caliber thoracic aorta on this noncontrast examination with mild atherosclerotic calcified plaque. Heart is nonenlarged.   Mediastinum/Nodes: Patulous esophagus with small hiatal hernia. Small thyroid gland. On  this non IV contrast exam there is no specific abnormal lymph node enlargement identified in the axillary regions, hilum or mediastinum.   Lungs/Pleura: There is a linear opacity seen at the lung bases likely scar or atelectasis. No consolidation, pneumothorax or effusion. Areas of bronchiectasis are identified. The ill-defined ground-glass area seen in the inferior aspect of the right upper lobe on the prior examination measuring 6 mm is stable today on series 5 image 69 measuring 6 mm today again. No new lung nodule or mass lesion. Apical pleural thickening.   Upper Abdomen: The adrenal glands are preserved in the upper abdomen.   Musculoskeletal: Mild degenerative changes along the spine.   IMPRESSION: Stable 6 mm ground-glass right upper lobe lung nodule. Based on prior recommendation and with the current stability, would recommend next follow up in 2 years.  Assessment/Plan: Encounter Diagnoses  Name Primary?   Chronic cough Yes   Chronic GERD    Environmental and seasonal allergies    Post-nasal drip    Nasal septal deviation    Hypertrophy of both inferior nasal turbinates    Dysfunction of Eustachian tube, unspecified laterality     Assessment and Plan    Chronic Dry Cough Persistent nocturnal dry cough for several months. No fever or systemic symptoms. Previous amoxicillin treatment ineffective. Possible etiologies include untreated GERD, postnasal drainage, or neurogenic cough. Nasal endoscopy showed postnasal drainage and post-cricoid edema pachydermia indicative of reflux. Discussed reflux as a likely cause due to nocturnal positioning, with postnasal drainage and neurogenic cough as potential  contributors. Recommended alginate therapy for reflux and combination of allergy pill with nasal spray for postnasal drainage. He already had CT chest with Pulm 06/2022 for pulmonary nodules, and has another scan pending. If chronic cough persists, will consider CXR if CT scan  will not get done sooner.  - Continue pantoprazole 40 mg daily - Add alginates after meals and before bedtime - Use Flonase nasal spray 2 puffs b/l nares BID  - Take Zyrtec 10 mg daily - Follow up if symptoms persist or worsen - will consider CXR and SLN block if cough is deemed neurogenic based on clinical presentation   Chronic nasal congestion and Postnasal Drip/Suspected environmental vs seasonal allergies  Clear secretions and postnasal drainage noted during endoscopy, likely contributing to nocturnal cough. Recommended Flonase and Zyrtec for management. - Use Flonase nasal spray daily - Take Zyrtec daily - Follow up if symptoms persist or worsen  Gastroesophageal Reflux Disease (GERD) Ongoing reflux managed with pantoprazole. Endoscopy revealed significant esophageal swelling, suggesting ongoing reflux. Discussed untreated reflux leading to nocturnal cough and throat irritation. Recommended alginate paste to prevent reflux. - Continue pantoprazole daily - Add alginate paste after meals and before bedtime - Follow up if symptoms persist or worsen  Eustachian Tube Dysfunction Complaints of ear fullness and occasional fluid behind the ear. Examination showed no fluid today. Likely related to nasal congestion causing ETD - Continue Flonase nasal spray daily - Monitor symptoms and follow up if ear fullness persists  Septal deviation and inferior turbinate hypertrophy on exam - monitor for now, medical management of nasal congestion as above  Follow-up - Follow up if symptoms persist or worsen - Chest CT ordered by pulmonary specialist.        Thank you for allowing me to participate in the care of this patient. Please do not hesitate to contact me with any questions or concerns.   Ashok Croon, MD Otolaryngology [facility] Phone: [phone] Fax: [phone]    01/22/2023, 11:03 AM

## 2023-01-22 NOTE — Progress Notes (Signed)
Patient ID: Paul Ryan, male    DOB: 1945-09-29, 77 y.o.   MRN: [id number]   Chief Complaint  Patient presents with   Skin Problem    The patient is a 77 year old male here for evaluation of his face.  He has laser in the past which helped with some of the hyperpigmentation.  He is interested in seeing if it would help again.  He is half [place] and half [place] so he would fall into a Fitzpatrick 5 category.  We will need to be very careful so as not to create any hyper or hypopigmentation.  We went ahead and did a test dose on 2 spots 1 on the right cheek laterally and 1 on the left cheek inferior laterally.    Review of Systems  Constitutional: Negative.   Respiratory: Negative.    Endocrine: Negative.   Genitourinary: Negative.     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   Hyperglycemia 12/26/2014   Hyperlipidemia    Hypothyroid 06/10/2011   Insomnia 04/24/2016   Left hip pain 12/17/2010   MCI (mild cognitive impairment) 07/22/2016   Nasal lesion 07/22/2016   Polymyalgia ([name]) 03/21/2014   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 E11.9, Disp: 100 strip, Rfl: 1   Accu-Chek Softclix Lancets lancets, USE AS  DIRECTED TO CHECK SUGAR ONCE DAILY.E11.9, Disp: 100 each, Rfl: 1   Ascorbic Acid (VITAMIN C ER PO), Take by mouth daily., Disp: , Rfl:    benzonatate (TESSALON) 100 MG capsule, Take 1 capsule (100 mg total) by mouth 2 (two) times daily as needed for cough., Disp: 20 capsule, Rfl: 0   Blood Glucose Monitoring Suppl (ONETOUCH VERIO FLEX SYSTEM) w/Device KIT, Use to check blood sugar once a day. DX E11.9, Disp: 1 kit, Rfl: 0   cetirizine (ZYRTEC) 10 MG tablet, Take 1 tablet (10 mg total) by mouth daily., Disp: 30 tablet, Rfl: 11   Cholecalciferol 4000 UNITS TABS, Take 1 tablet by mouth daily., Disp: , Rfl:    DULoxetine (CYMBALTA) 30 MG capsule, Take 30 mg by mouth 2 (two) times daily., Disp: , Rfl:    DULoxetine (CYMBALTA) 60 MG capsule, Take 60 mg by mouth daily. 60 mg at night, Disp: , Rfl:    fluticasone (FLONASE) 50 MCG/ACT nasal spray, Place 2 sprays into both nostrils daily., Disp: 16 g, Rfl: 6   gabapentin (NEURONTIN) 100 MG capsule, Take 2 capsules (200 mg total) by mouth at bedtime., Disp: 180 capsule, Rfl: 0   NON FORMULARY, AHCC immune support, Disp: , Rfl:    pantoprazole (PROTONIX) 40 MG tablet, TAKE 1 TABLET BY MOUTH EVERY  DAY, Disp: 90 tablet, Rfl: 1   predniSONE (DELTASONE) 1 MG tablet, TAKE 1 TABLET BY MOUTH EVERY DAY WITH BREAKFAST, Disp: 30 tablet, Rfl: 2   rosuvastatin (CRESTOR) 10 MG tablet, TAKE 1 TABLET BY MOUTH EVERY DAY, Disp: 30 tablet, Rfl: 0   thyroid (ARMOUR THYROID) 60 MG tablet, TAKE ONE TABLET BY MOUTH ON MONDAY, WEDNESDAY, THURSDAY, FRIDAY, AND SUNDAY., Disp: 65 tablet, Rfl: 1   warfarin (COUMADIN) 4 MG tablet, TAKE 1 TABLET BY MOUTH EVERY DAY, Disp: 90 tablet, Rfl: 0   Objective:   There were no vitals filed for this visit.  Physical Exam Constitutional:      Appearance: Normal appearance.  Musculoskeletal:        General: No swelling or deformity.  Skin:    General: Skin is warm.     Capillary Refill: Capillary refill takes less than 2 seconds.   Neurological:     Mental Status: He is alert and oriented to person, place, and time.  Psychiatric:        Mood and Affect: Mood normal.        Behavior: Behavior normal.        Thought Content: Thought content normal.        Judgment: Judgment normal.     Assessment & Plan:  Encounter for counseling  Pictures were obtained of the patient and placed in the chart with the patient's or guardian's permission.  Muscle going to set the patient up with [facility] for a consultation so he can get on some ZO or Sente.  I will plan to see the patient back in 1 month.  Alena Bills Wendy Mikles, DO

## 2023-01-26 ENCOUNTER — Ambulatory Visit: Payer: Self-pay

## 2023-01-26 ENCOUNTER — Encounter: Payer: Self-pay | Admitting: Family Medicine

## 2023-01-26 DIAGNOSIS — Z719 Counseling, unspecified: Secondary | ICD-10-CM

## 2023-01-27 DIAGNOSIS — Z85828 Personal history of other malignant neoplasm of skin: Secondary | ICD-10-CM | POA: Diagnosis not present

## 2023-01-27 DIAGNOSIS — L820 Inflamed seborrheic keratosis: Secondary | ICD-10-CM | POA: Diagnosis not present

## 2023-01-27 DIAGNOSIS — L814 Other melanin hyperpigmentation: Secondary | ICD-10-CM | POA: Diagnosis not present

## 2023-01-27 DIAGNOSIS — L538 Other specified erythematous conditions: Secondary | ICD-10-CM | POA: Diagnosis not present

## 2023-01-27 DIAGNOSIS — Z08 Encounter for follow-up examination after completed treatment for malignant neoplasm: Secondary | ICD-10-CM | POA: Diagnosis not present

## 2023-01-27 DIAGNOSIS — L821 Other seborrheic keratosis: Secondary | ICD-10-CM | POA: Diagnosis not present

## 2023-01-27 DIAGNOSIS — D225 Melanocytic nevi of trunk: Secondary | ICD-10-CM | POA: Diagnosis not present

## 2023-02-07 ENCOUNTER — Other Ambulatory Visit: Payer: Self-pay | Admitting: Oncology

## 2023-02-07 ENCOUNTER — Other Ambulatory Visit: Payer: Self-pay | Admitting: Family Medicine

## 2023-02-07 DIAGNOSIS — I82509 Chronic embolism and thrombosis of unspecified deep veins of unspecified lower extremity: Secondary | ICD-10-CM

## 2023-02-09 ENCOUNTER — Telehealth: Payer: Self-pay | Admitting: Cardiology

## 2023-02-09 ENCOUNTER — Encounter: Payer: Self-pay | Admitting: Oncology

## 2023-02-09 NOTE — Telephone Encounter (Signed)
*  STAT* If patient is at the pharmacy, call can be transferred to refill team.   1. Which medications need to be refilled? (please list name of each medication and dose if known) rosuvastatin (CRESTOR) 10 MG tablet   2. Which pharmacy/location (including street and city if local pharmacy) is medication to be sent to?  [facility] [phone] [facility] - [place], [place] - [address]      3. Do they need a 30 day or 90 day supply? 90 day    Pt has scheduled office visit for 1/16.

## 2023-02-10 ENCOUNTER — Encounter: Payer: Self-pay | Admitting: Family Medicine

## 2023-02-10 MED ORDER — ROSUVASTATIN CALCIUM 10 MG PO TABS: 10.0000 mg | ORAL_TABLET | Freq: Every day | ORAL | 0 refills | Status: DC

## 2023-02-12 ENCOUNTER — Other Ambulatory Visit: Payer: Self-pay | Admitting: Family Medicine

## 2023-02-12 DIAGNOSIS — G6289 Other specified polyneuropathies: Secondary | ICD-10-CM

## 2023-02-13 DIAGNOSIS — M542 Cervicalgia: Secondary | ICD-10-CM | POA: Diagnosis not present

## 2023-02-23 ENCOUNTER — Encounter: Payer: Self-pay | Admitting: Oncology

## 2023-02-26 ENCOUNTER — Ambulatory Visit: Payer: Self-pay | Admitting: Plastic Surgery

## 2023-02-26 DIAGNOSIS — Z719 Counseling, unspecified: Secondary | ICD-10-CM

## 2023-02-26 NOTE — Progress Notes (Signed)
The patient is a 77 year old male here for follow-up after having test dose of laser the BBL.  There does not seem to be any ill effect so we went ahead and did test dose again on the same 2 spots on the cheeks.  Will have him come back in a few weeks and do his whole face.

## 2023-03-03 ENCOUNTER — Ambulatory Visit: Payer: Medicare PPO

## 2023-03-03 VITALS — Ht 71.0 in | Wt 163.0 lb

## 2023-03-03 DIAGNOSIS — Z0000 Encounter for general adult medical examination without abnormal findings: Secondary | ICD-10-CM

## 2023-03-03 NOTE — Progress Notes (Signed)
 Subjective:   Paul Ryan is a 77 y.o. male who presents for Medicare Annual/Subsequent preventive examination.  Visit Complete: Virtual I connected with  [name] on 03/03/23 by a audio enabled telemedicine application and verified that I am speaking with the correct person using two identifiers.  Patient Location: Home  Provider Location: Home Office  I discussed the limitations of evaluation and management by telemedicine. The patient expressed understanding and agreed to proceed.  Vital Signs: Because this visit was a virtual/telehealth visit, some criteria may be missing or patient reported. Any vitals not documented were not able to be obtained and vitals that have been documented are patient reported.   Cardiac Risk Factors include: advanced age (>81men, >9 women);diabetes mellitus;male gender     Objective:    Today's Vitals   03/03/23 0944  Weight: 163 lb (73.9 kg)  Height: 5' 11 (1.803 m)   Body mass index is 22.73 kg/m.     03/03/2023    9:51 AM 07/01/2022   10:46 AM 02/19/2022    2:08 PM 09/19/2020   10:24 AM 03/07/2020    8:19 AM 07/28/2019    9:33 AM 06/20/2019   11:50 AM  Advanced Directives  Does Patient Have a Medical Advance Directive? Yes Yes Yes Yes Yes Yes No  Type of Estate Agent of [place];Living will Healthcare Power of [place];Living will Healthcare Power of [place];Living will Healthcare Power of [place];Living will Healthcare Power of [place];Living will Healthcare Power of [place];Living will   Does patient want to make changes to medical advance directive?  No - Patient declined   No - Patient declined No - Patient declined   Copy of Healthcare Power of Attorney in Chart? No - copy requested No - copy requested No - copy requested No - copy requested No - copy requested No - copy requested   Would patient like information on creating a medical advance directive?       No - Patient declined    Current Medications  (verified) Outpatient Encounter Medications as of 03/03/2023  Medication Sig   ACCU-CHEK GUIDE test strip USE TO CHECK BLOOD SUGAR ONCE DAILY . E11.9   Accu-Chek Softclix Lancets lancets USE AS DIRECTED TO CHECK SUGAR ONCE DAILY.E11.9   Ascorbic Acid (VITAMIN C ER PO) Take by mouth daily.   benzonatate  (TESSALON ) 100 MG capsule Take 1 capsule (100 mg total) by mouth 2 (two) times daily as needed for cough.   Blood Glucose Monitoring Suppl (ONETOUCH VERIO FLEX SYSTEM) w/Device KIT Use to check blood sugar once a day. DX E11.9   cetirizine  (ZYRTEC ) 10 MG tablet Take 1 tablet (10 mg total) by mouth daily.   Cholecalciferol 4000 UNITS TABS Take 1 tablet by mouth daily.   DULoxetine  (CYMBALTA ) 30 MG capsule Take 30 mg by mouth 2 (two) times daily.   DULoxetine  (CYMBALTA ) 60 MG capsule Take 60 mg by mouth daily. 60 mg at night   fluticasone  (FLONASE ) 50 MCG/ACT nasal spray Place 2 sprays into both nostrils daily.   gabapentin  (NEURONTIN ) 100 MG capsule TAKE 2 CAPSULES BY MOUTH AT BEDTIME.   NON FORMULARY AHCC immune support   pantoprazole  (PROTONIX ) 40 MG tablet TAKE 1 TABLET BY MOUTH EVERY DAY   predniSONE  (DELTASONE ) 1 MG tablet TAKE 1 TABLET BY MOUTH EVERY DAY WITH BREAKFAST   rosuvastatin  (CRESTOR ) 10 MG tablet Take 1 tablet (10 mg total) by mouth daily.   thyroid  (ARMOUR THYROID ) 60 MG tablet TAKE ONE TABLET BY MOUTH ON MONDAY,  WEDNESDAY, THURSDAY, FRIDAY, AND SUNDAY.   warfarin (COUMADIN ) 4 MG tablet TAKE 1 TABLET BY MOUTH EVERY DAY   No facility-administered encounter medications on file as of 03/03/2023.    Allergies (verified) Patient has no known allergies.   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   Hyperglycemia 12/26/2014   Hyperlipidemia    Hypothyroid 06/10/2011   Insomnia 04/24/2016   Left hip pain 12/17/2010   MCI (mild cognitive impairment) 07/22/2016   Nasal lesion 07/22/2016   Polymyalgia ([name]) 03/21/2014   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   Preventative health care 12/17/2010   Shingles 1982   right abdominal wall   Staph skin infection 07/22/2016   Thyroid  nodule 11/29/2014   Urinary hesitancy 09/30/2016   Vitamin D  deficiency 10/14/2011   Past Surgical History:  Procedure Laterality Date   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   Family History  Problem Relation Age of Onset   Cancer Mother        unknown- everywhere   Cancer Sister        breast   Social History   Socioeconomic History   Marital status: Married    Spouse name: Not on file   Number of children: Not on file   Years of education: Not on file   Highest education level: Not on file  Occupational History   Not on file  Tobacco Use   Smoking status: Former    Current packs/day: 0.00    Average packs/day: 1 pack/day for 20.0 years (20.0 ttl pk-yrs)    Types: Cigarettes    Start date: 03/03/1960    Quit date: 03/03/1980    Years since quitting: 43.0   Smokeless tobacco: Never  Substance and Sexual Activity   Alcohol use: No    Alcohol/week: 0.0 standard drinks of alcohol   Drug use: No   Sexual activity: Not on file  Other Topics Concern   Not on file  Social History Narrative   Not on file   Social Drivers of Health   Financial Resource Strain: Low Risk  (03/03/2023)   Overall Financial Resource Strain (CARDIA)    Difficulty of Paying Living Expenses: Not hard at all  Food Insecurity: No Food Insecurity (03/03/2023)   Hunger Vital Sign    Worried About Running Out of Food in the Last Year: Never true    Ran Out of Food in the Last Year: Never true  Transportation Needs: No  Transportation Needs (03/03/2023)   PRAPARE - Administrator, Civil Service (Medical): No    Lack of Transportation (Non-Medical): No  Physical Activity: Sufficiently Active (03/03/2023)   Exercise Vital Sign    Days of Exercise per Week: 5 days    Minutes of Exercise per Session: 30 min  Stress: No Stress Concern Present (03/03/2023)   [employer] of Occupational Health - Occupational Stress Questionnaire    Feeling of Stress : Not at all  Social Connections: Moderately Isolated (03/03/2023)   Social Connection and Isolation Panel [NHANES]    Frequency of Communication with Friends and  Family: More than three times a week    Frequency of Social Gatherings with Friends and Family: More than three times a week    Attends Religious Services: Never    Database Administrator or Organizations: No    Attends Engineer, Structural: Never    Marital Status: Married    Tobacco Counseling Counseling given: Not Answered   Clinical Intake:  Pre-visit preparation completed: Yes  Pain : No/denies pain     BMI - recorded: 22.73 Nutritional Status: BMI of 19-24  Normal Nutritional Risks: None Diabetes: No  How often do you need to have someone help you when you read instructions, pamphlets, or other written materials from your doctor or pharmacy?: 1 - Never  Interpreter Needed?: No  Information entered by :: Rojelio Blush LPN   Activities of Daily Living    03/03/2023    9:49 AM  In your present state of health, do you have any difficulty performing the following activities:  Hearing? 0  Vision? 0  Difficulty concentrating or making decisions? 0  Walking or climbing stairs? 0  Dressing or bathing? 0  Doing errands, shopping? 0  Preparing Food and eating ? N  Using the Toilet? N  In the past six months, have you accidently leaked urine? N  Do you have problems with loss of bowel control? N  Managing your Medications? N  Managing your Finances? N   Housekeeping or managing your Housekeeping? N    Patient Care Team: Domenica Harlene LABOR, MD as PCP - General (Family Medicine) Jeffrie Oneil BROCKS, MD as PCP - Cardiology (Cardiology) Nonie Lonni RAMAN, MD (Ophthalmology) Luke Delon PARAS, PharmD (Inactive) as Pharmacist (Pharmacist) Gershon Donnice SAUNDERS, DPM as Consulting Physician (Podiatry)  Indicate any recent Medical Services you may have received from other than [facility] providers in the past year (date may be approximate).     Assessment:   This is a routine wellness examination for [place].  Hearing/Vision screen Hearing Screening - Comments:: Denies hearing difficulties   Vision Screening - Comments:: Wears rx glasses - up to date with routine eye exams with  Dr Toi   Goals Addressed             This Visit's Progress    Patient Stated       Stay active and healthy!       Depression Screen    03/03/2023    9:49 AM 04/15/2022    9:39 AM 02/19/2022    2:07 PM 12/17/2021    1:33 PM 07/25/2021    2:06 PM 09/24/2020   10:52 AM 09/19/2020   10:26 AM  PHQ 2/9 Scores  PHQ - 2 Score 0 0 0 0 0 0 0  PHQ- 9 Score  0  0       Fall Risk    03/03/2023    9:50 AM 04/15/2022    9:39 AM 02/19/2022    2:09 PM 02/18/2022    5:39 PM 12/17/2021    1:33 PM  Fall Risk   Falls in the past year? 0 0 0 0 1  Number falls in past yr: 0 0 0  0  Injury with Fall? 0 0 0 0 0  Risk for fall due to : No Fall Risks  Impaired vision    Follow up Falls prevention discussed Falls evaluation completed Falls prevention discussed  Falls evaluation completed    MEDICARE RISK AT HOME: Medicare Risk at Home Any stairs in or around the  home?: Yes If so, are there any without handrails?: No Home free of loose throw rugs in walkways, pet beds, electrical cords, etc?: Yes Adequate lighting in your home to reduce risk of falls?: Yes Life alert?: No Use of a cane, walker or w/c?: No Grab bars in the bathroom?: Yes Shower chair or bench in shower?:  No Elevated toilet seat or a handicapped toilet?: No  TIMED UP AND GO:  Was the test performed?  No    Cognitive Function:      09/21/2017    9:00 AM  Montreal Cognitive Assessment   Visuospatial/ Executive (0/5) 5  Naming (0/3) 3  Attention: Read list of digits (0/2) 2  Attention: Read list of letters (0/1) 1  Attention: Serial 7 subtraction starting at 100 (0/3) 2  Language: Repeat phrase (0/2) 2  Language : Fluency (0/1) 1  Abstraction (0/2) 2  Delayed Recall (0/5) 4  Orientation (0/6) 6  Total 28      03/03/2023    9:51 AM 02/19/2022    2:10 PM  6CIT Screen  What Year? 0 points 0 points  What month? 0 points 0 points  What time? 0 points 0 points  Count back from 20 0 points 0 points  Months in reverse 0 points 0 points  Repeat phrase 0 points 0 points  Total Score 0 points 0 points    Immunizations Immunization History  Administered Date(s) Administered   Fluad Quad(high Dose 65+) 11/15/2021   Hepatitis A 11/09/1998   IPV 11/09/1998   Influenza Split 11/22/2015   Influenza Whole 12/13/2012   Influenza, High Dose Seasonal PF 10/31/2016, 11/01/2018, 11/09/2019, 11/01/2022   Influenza,inj,Quad PF,6+ Mos 10/25/2013, 12/26/2014   Influenza-Unspecified 12/02/2011, 12/02/2017, 12/02/2017   PFIZER(Purple Top)SARS-COV-2 Vaccination 04/08/2019, 05/02/2019, 12/13/2019, 12/14/2020   PNEUMOCOCCAL CONJUGATE-20 08/29/2021   PPD Test 03/23/2015   Pfizer(Comirnaty )Fall Seasonal Vaccine 12 years and older 12/20/2021, 11/01/2022   Pneumococcal Conjugate-13 10/25/2013   Pneumococcal Polysaccharide-23 06/19/2009, 12/26/2014   Respiratory Syncytial Virus Vaccine,Recomb Aduvanted(Arexvy) 11/15/2021   Td 03/03/1990, 07/01/2001   Td (Adult), 2 Lf Tetanus Toxid, Preservative Free 03/03/1990, 07/01/2001   Tdap 12/17/2010, 04/15/2022   Typhoid Inactivated 11/09/1998, 03/09/2001   Unspecified SARS-COV-2 Vaccination 09/12/2020   Zoster Recombinant(Shingrix) 11/03/2018, 05/19/2019    Zoster, Live 08/01/2008    TDAP status: Up to date  Flu Vaccine status: Up to date  Pneumococcal vaccine status: Up to date  Covid-19 vaccine status: Declined, Education has been provided regarding the importance of this vaccine but patient still declined. Advised may receive this vaccine at local pharmacy or Health Dept.or vaccine clinic. Aware to provide a copy of the vaccination record if obtained from local pharmacy or Health Dept. Verbalized acceptance and understanding.  Qualifies for Shingles Vaccine? Yes   Zostavax completed Yes   Shingrix Completed?: Yes  Screening Tests Health Maintenance  Topic Date Due   FOOT EXAM  09/21/2021   OPHTHALMOLOGY EXAM  05/02/2022   COVID-19 Vaccine (8 - 2024-25 season) 12/27/2022   HEMOGLOBIN A1C  02/13/2023   Diabetic kidney evaluation - eGFR measurement  08/14/2023   Diabetic kidney evaluation - Urine ACR  08/14/2023   Medicare Annual Wellness (AWV)  03/02/2024   DTaP/Tdap/Td (6 - Td or Tdap) 04/15/2032   Pneumonia Vaccine 69+ Years old  Completed   INFLUENZA VACCINE  Completed   Hepatitis C Screening  Completed   Zoster Vaccines- Shingrix  Completed   HPV VACCINES  Aged Out   Colonoscopy  Discontinued    Health  Maintenance  Health Maintenance Due  Topic Date Due   FOOT EXAM  09/21/2021   OPHTHALMOLOGY EXAM  05/02/2022   COVID-19 Vaccine (8 - 2024-25 season) 12/27/2022   HEMOGLOBIN A1C  02/13/2023        Additional Screening:  Hepatitis C Screening: does qualify; Completed 03/30/15  Vision Screening: Recommended annual ophthalmology exams for early detection of glaucoma and other disorders of the eye. Is the patient up to date with their annual eye exam?  Yes  Who is the provider or what is the name of the office in which the patient attends annual eye exams? Dr Toi If pt is not established with a provider, would they like to be referred to a provider to establish care? No .   Dental Screening: Recommended annual  dental exams for proper oral hygiene   Community Resource Referral / Chronic Care Management:  CRR required this visit?  No   CCM required this visit?  No     Plan:     I have personally reviewed and noted the following in the patient's chart:   Medical and social history Use of alcohol, tobacco or illicit drugs  Current medications and supplements including opioid prescriptions. Patient is not currently taking opioid prescriptions. Functional ability and status Nutritional status Physical activity Advanced directives List of other physicians Hospitalizations, surgeries, and ER visits in previous 12 months Vitals Screenings to include cognitive, depression, and falls Referrals and appointments  In addition, I have reviewed and discussed with patient certain preventive protocols, quality metrics, and best practice recommendations. A written personalized care plan for preventive services as well as general preventive health recommendations were provided to patient.     Rojelio LELON Blush, LPN   [id number]   After Visit Summary: (MyChart) Due to this being a telephonic visit, the after visit summary with patients personalized plan was offered to patient via MyChart   Nurse Notes: None

## 2023-03-03 NOTE — Patient Instructions (Addendum)
 Paul Ryan , Thank you for taking time to come for your Medicare Wellness Visit. I appreciate your ongoing commitment to your health goals. Please review the following plan we discussed and let me know if I can assist you in the future.   Referrals/Orders/Follow-Ups/Clinician Recommendations:   This is a list of the screening recommended for you and due dates:  Health Maintenance  Topic Date Due   Complete foot exam   09/21/2021   Eye exam for diabetics  05/02/2022   COVID-19 Vaccine (8 - 2024-25 season) 12/27/2022   Hemoglobin A1C  02/13/2023   Yearly kidney function blood test for diabetes  08/14/2023   Yearly kidney health urinalysis for diabetes  08/14/2023   Medicare Annual Wellness Visit  03/02/2024   DTaP/Tdap/Td vaccine (6 - Td or Tdap) 04/15/2032   Pneumonia Vaccine  Completed   Flu Shot  Completed   Hepatitis C Screening  Completed   Zoster (Shingles) Vaccine  Completed   HPV Vaccine  Aged Out   Colon Cancer Screening  Discontinued    Advanced directives: (Copy Requested) Please bring a copy of your health care power of attorney and living will to the office to be added to your chart at your convenience.  Next Medicare Annual Wellness Visit scheduled for next year: Yes

## 2023-03-09 ENCOUNTER — Encounter (INDEPENDENT_AMBULATORY_CARE_PROVIDER_SITE_OTHER): Payer: Medicare PPO

## 2023-03-10 DIAGNOSIS — M542 Cervicalgia: Secondary | ICD-10-CM | POA: Diagnosis not present

## 2023-03-12 DIAGNOSIS — H43813 Vitreous degeneration, bilateral: Secondary | ICD-10-CM | POA: Diagnosis not present

## 2023-03-12 DIAGNOSIS — H524 Presbyopia: Secondary | ICD-10-CM | POA: Diagnosis not present

## 2023-03-12 DIAGNOSIS — H35362 Drusen (degenerative) of macula, left eye: Secondary | ICD-10-CM | POA: Diagnosis not present

## 2023-03-17 ENCOUNTER — Other Ambulatory Visit: Payer: Medicare PPO | Admitting: Plastic Surgery

## 2023-03-17 NOTE — Progress Notes (Signed)
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 male with a PMH of coronary calcifications, HLD, protein S deficiency with DVT (on chronic warfarin) hypothyroidism GERD, former tobacco abuse, pulmonary nodules, peripheral neuropathy who presents today for 50-month follow-up.  Paul Ryan was seen initially by Dr. Anne Fu and 04/2021 for evaluation of hyperlipidemia.  His LDLs were 123 with triglycerides at 172 and LP(a) of 512.  He completed a coronary calcium score due to elevated LP(a) that showed a score of 613 which was 67 percentile and Crestor was increased to 20 mg. The CT also discovered lung nodule on and he was advised to follow-up with pulmonology.  He was seen by Dr. Myra Gianotti for complaint of burning and numbness on bottom of feet.  He had normal ABIs and was started on gabapentin for peripheral  neuropathy.  Mr. Wragg presents today for 1 year follow-up.  The patient reports no changes in heart health since the last visit, with no chest pain, shortness of breath, or skipped beats. The patient remains active, walking about three miles daily. However, the patient admits to a poor diet and struggles with dietary changes. The patient's A1c is 6.7, indicating prediabetes. The patient also reports burning and numbness in the feet, diagnosed as neuropathy, and is currently on Gabapentin for symptom management. The patient has a history of blood clot and is on Coumadin. The patient also has nodules on the lungs, which are being monitored annually. He denies chest pain, palpitations, dyspnea, PND, orthopnea, nausea, vomiting, dizziness, syncope, edema, weight gain, or early satiety.   Review of Systems  Please see the history of present illness.    All other systems reviewed and are otherwise negative except as noted above.  Physical Exam    Wt Readings from Last 3 Encounters:  03/19/23 168 lb 3.2 oz (76.3 kg)  03/03/23 163 lb (73.9 kg)  01/22/23 163 lb (73.9 kg)   VS: Vitals:   03/19/23 1029  BP: 136/88  Pulse: 63  SpO2: 99%  ,Body mass index is 23.46 kg/m. GEN: Well nourished, well developed in no acute distress Neck: No JVD; No carotid bruits Pulmonary: Clear to auscultation without rales, wheezing or rhonchi  Cardiovascular: Normal rate. Regular rhythm. Normal S1. Normal S2.   Murmurs: There is no murmur.  ABDOMEN: Soft, non-tender, non-distended EXTREMITIES:  No  edema; No deformity   EKG/LABS/ Recent Cardiac Studies   ECG personally reviewed by me today -sinus rhythm rate of 63 and no acute changes consistent with previous EKG. Risk Assessment/Calculations:          Lab Results  Component Value Date   WBC 8.7 12/11/2022   HGB 15.1 12/11/2022   HCT 46.7 12/11/2022   MCV 89.4 12/11/2022   PLT 262.0 12/11/2022   Lab Results  Component Value Date   CREATININE 1.11  08/14/2022   BUN 15 08/14/2022   NA 139 08/14/2022   K 4.4 08/14/2022   CL 106 08/14/2022   CO2 26 08/14/2022   Lab Results  Component Value Date   CHOL 201 (H) 04/14/2022   HDL 36.90 (L) 04/14/2022   LDLCALC 129 (H) 04/14/2022   LDLDIRECT 156.0 12/17/2021   TRIG 174.0 (H) 04/14/2022   CHOLHDL 5 04/14/2022    Lab Results  Component Value Date   HGBA1C 6.7 (H) 08/14/2022   Assessment & Plan    1.  Hyperlipidemia: -Persistent elevated LDL despite Crestor 10mg  daily. History of elevated calcium score and lipoprotein A. Patient has been on higher doses of Crestor in the past with minimal change in LDL levels. -Refer to lipid clinic for further management and consideration of PCSK9 inhibitors.  2.  Coronary calcifications -Calcium score completed in 06/2021 showing score of 613 which was 67 percentile and Crestor was increased to 20 mg -He reports no chest pain or angina since previous follow-up. -He reports elevated LDL and will be referred to lipid clinic as noted above.  3.  History of protein S deficiency: -History of blood clot in lung, currently managed with Warfarin. No recent issues with bleeding. -Continue Warfarin as prescribed.  4. Prediabetes: Recent HbA1c of 6.7, indicating progression towards type 2 diabetes. Patient acknowledges suboptimal diet. -Encourage moderation in diet and regular physical activity. -Consider rechecking HbA1c in June and possible initiation of Metformin if HbA1c remains elevated.  5.  History of lung nodule: -Located in [place] currently followed by pulmonology  6.  Idiopathic peripheral Neuropathy: Burning and numbness in feet, managed with Gabapentin. Patient reports some relief with current treatment. -Continue Gabapentin as prescribed.   Disposition: Follow-up with Donato Schultz, MD or APP in 12 months    Signed, Napoleon Form, Leodis Rains, NP 03/19/2023, 10:55 AM [facility]

## 2023-03-19 ENCOUNTER — Encounter: Payer: Self-pay | Admitting: Nurse Practitioner

## 2023-03-19 ENCOUNTER — Encounter: Payer: Self-pay | Admitting: Family Medicine

## 2023-03-19 ENCOUNTER — Ambulatory Visit: Payer: Medicare PPO | Attending: Nurse Practitioner | Admitting: Nurse Practitioner

## 2023-03-19 VITALS — BP 136/88 | HR 63 | Ht 71.0 in | Wt 168.2 lb

## 2023-03-19 DIAGNOSIS — E785 Hyperlipidemia, unspecified: Secondary | ICD-10-CM | POA: Diagnosis not present

## 2023-03-19 DIAGNOSIS — I2510 Atherosclerotic heart disease of native coronary artery without angina pectoris: Secondary | ICD-10-CM | POA: Diagnosis not present

## 2023-03-19 DIAGNOSIS — D6859 Other primary thrombophilia: Secondary | ICD-10-CM

## 2023-03-19 DIAGNOSIS — I739 Peripheral vascular disease, unspecified: Secondary | ICD-10-CM

## 2023-03-19 DIAGNOSIS — R911 Solitary pulmonary nodule: Secondary | ICD-10-CM

## 2023-03-19 DIAGNOSIS — I82599 Chronic embolism and thrombosis of other specified deep vein of unspecified lower extremity: Secondary | ICD-10-CM | POA: Diagnosis not present

## 2023-03-19 DIAGNOSIS — G609 Hereditary and idiopathic neuropathy, unspecified: Secondary | ICD-10-CM | POA: Diagnosis not present

## 2023-03-19 NOTE — Patient Instructions (Signed)
Medication Instructions:  Your physician recommends that you continue on your current medications as directed. Please refer to the Current Medication list given to you today.  *If you need a refill on your cardiac medications before your next appointment, please call your pharmacy*   Lab Work: None ordered   If you have labs (blood work) drawn today and your tests are completely normal, you will receive your results only by: MyChart Message (if you have MyChart) OR A paper copy in the mail If you have any lab test that is abnormal or we need to change your treatment, we will call you to review the results.   Testing/Procedures:  Your physician has referred you to our lipid clinic    Follow-Up: At [facility], you and your health needs are our priority.  As part of our continuing mission to provide you with exceptional heart care, we have created designated Provider Care Teams.  These Care Teams include your primary Cardiologist (physician) and Advanced Practice Providers (APPs -  Physician Assistants and Nurse Practitioners) who all work together to provide you with the care you need, when you need it.  We recommend signing up for the patient portal called "MyChart".  Sign up information is provided on this After Visit Summary.  MyChart is used to connect with patients for Virtual Visits (Telemedicine).  Patients are able to view lab/test results, encounter notes, upcoming appointments, etc.  Non-urgent messages can be sent to your provider as well.   To learn more about what you can do with MyChart, go to [email or web address].    Your next appointment:   12 month(s)  Provider:   Donato Schultz, MD     Other Instructions

## 2023-03-20 ENCOUNTER — Encounter: Payer: Self-pay | Admitting: Plastic Surgery

## 2023-03-20 ENCOUNTER — Ambulatory Visit (INDEPENDENT_AMBULATORY_CARE_PROVIDER_SITE_OTHER): Payer: Self-pay | Admitting: Plastic Surgery

## 2023-03-20 VITALS — BP 113/75 | HR 63

## 2023-03-20 DIAGNOSIS — Z719 Counseling, unspecified: Secondary | ICD-10-CM

## 2023-03-20 NOTE — Progress Notes (Signed)
Preoperative Dx: hyperpigmentation of face  Postoperative Dx:  same  Procedure: laser to face   Anesthesia: none  Description of Procedure:  Risks and complications were explained to the patient. Consent was confirmed and signed. Eye protection was placed. Time out was called and all information was confirmed to be correct. The area  area was prepped with alcohol and wiped dry. The BBL laser was set at 590 and 560 nm preset J/cm2. The face was lasered. The patient tolerated the procedure well and there were no complications. The patient is to follow up in 4 weeks.

## 2023-03-24 ENCOUNTER — Other Ambulatory Visit: Payer: Self-pay | Admitting: Family Medicine

## 2023-03-26 DIAGNOSIS — M542 Cervicalgia: Secondary | ICD-10-CM | POA: Diagnosis not present

## 2023-04-02 ENCOUNTER — Encounter: Payer: Self-pay | Admitting: Oncology

## 2023-04-15 DIAGNOSIS — D6859 Other primary thrombophilia: Secondary | ICD-10-CM | POA: Diagnosis not present

## 2023-04-15 DIAGNOSIS — Z7901 Long term (current) use of anticoagulants: Secondary | ICD-10-CM | POA: Diagnosis not present

## 2023-04-15 DIAGNOSIS — I2699 Other pulmonary embolism without acute cor pulmonale: Secondary | ICD-10-CM | POA: Diagnosis not present

## 2023-04-21 ENCOUNTER — Encounter: Payer: Self-pay | Admitting: Plastic Surgery

## 2023-04-21 ENCOUNTER — Ambulatory Visit (INDEPENDENT_AMBULATORY_CARE_PROVIDER_SITE_OTHER): Payer: Self-pay | Admitting: Plastic Surgery

## 2023-04-21 DIAGNOSIS — Z719 Counseling, unspecified: Secondary | ICD-10-CM

## 2023-04-21 NOTE — Progress Notes (Signed)
Preoperative Dx: hyperpigmentation of face  Postoperative Dx:  same  Procedure: laser to face   Anesthesia: none  Description of Procedure:  Risks and complications were explained to the patient. Consent was confirmed and signed. Eye protection was placed. Time out was called and all information was confirmed to be correct. The area  area was prepped with alcohol and wiped dry. The Heroic laser was set at 590 at base and then 560 for spot treatments at the presettings and -1 J/cm2 respectively. The face was lasered. The patient tolerated the procedure well and there were no complications. The patient is to follow up in 4 weeks.   Pictures were obtained of the patient and placed in the chart with the patient's or guardian's permission.

## 2023-04-30 ENCOUNTER — Telehealth: Payer: Self-pay | Admitting: Cardiology

## 2023-04-30 DIAGNOSIS — R072 Precordial pain: Secondary | ICD-10-CM

## 2023-04-30 NOTE — Telephone Encounter (Signed)
Patient is returning Sherri's call.  °

## 2023-04-30 NOTE — Telephone Encounter (Signed)
 Patient reports some CP/discomfort on Tuesday & Wednesday. On Tuesday he went out into the garden and started to dig/take out a big rose bush.  After about "the 3rd blow" he was out of breath and experienced CP of 4/10, last about 2-3 minutes.  He sat down and pain relieved, but he was very tired the remainder of the day.  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  Denies radiating pain, dizziness, edema. Informed pt that I do not think this is heart related CP, however if this pain reoccurs/worsens/radiating pain accompanies the CP to go to ER for evaluation. Aware forwarding to MD for review/advisement.  He said we could reach out w/ MyChart with recommendation

## 2023-04-30 NOTE — Telephone Encounter (Signed)
  Pt c/o of Chest Pain: STAT if active CP, including tightness, pressure, jaw pain, radiating pain to shoulder/upper arm/back, CP unrelieved by Nitro. Symptoms reported of SOB, nausea, vomiting, sweating.  1. Are you having CP right now?   No  2. Are you experiencing any other symptoms (ex. SOB, nausea, vomiting, sweating)?   SOB  3. Is your CP continuous or coming and going?   Continuous for about two minutes  4. Have you taken Nitroglycerin?   No  5. How long have you been experiencing CP?    Patient stated he had chest tightness on Tuesday (100% tightness) and Wednesday (60% tightness).    6. If NO CP at time of call then end call with telling Pt to call back or call 911 if Chest pain returns prior to return call from triage team.   Patient stated he had some chest pains (tightness in chest) yesterday with SOB.

## 2023-04-30 NOTE — Progress Notes (Unsigned)
 Patient ID: Paul Ryan                 DOB: 10-19-1945                    MRN: [id number]      HPI: Paul Ryan is a 78 y.o. male patient referred to lipid clinic by Robin Searing, NP. 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  Rosuvastatin 10 or 20mg ? Compliance?    Reviewed options for lowering LDL cholesterol, including ezetimibe, PCSK-9 inhibitors, bempedoic acid and inclisiran.  Discussed mechanisms of action, dosing, side effects and potential decreases in LDL cholesterol.  Also reviewed cost information and potential options for patient assistance.   Current Medications: rosuvastatin 20mg  daily Intolerances:  Risk Factors: age, CAC >400, elevated LP(a), DM LDL-C goal: <70 could consider <55 ApoB goal:   Diet:   Exercise:   Family History:   Social History:   Labs: Lipid Panel     Component Value Date/Time   CHOL 201 (H) 04/14/2022 0801   CHOL 173 05/22/2011 0914   TRIG 174.0 (H) 04/14/2022 0801   TRIG 57 05/22/2011 0914   HDL 36.90 (L) 04/14/2022 0801   HDL 49 05/22/2011 0914   CHOLHDL 5 04/14/2022 0801   VLDL 34.8 04/14/2022 0801   LDLCALC 129 (H) 04/14/2022 0801   LDLCALC 113 (H) 05/22/2011 0914   LDLDIRECT 156.0 12/17/2021 1411    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   Hyperglycemia 12/26/2014   Hyperlipidemia    Hypothyroid 06/10/2011   Insomnia 04/24/2016   Left hip pain 12/17/2010   MCI (mild cognitive impairment) 07/22/2016   Nasal  lesion 07/22/2016   Polymyalgia ([name]) 03/21/2014   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 E11.9 100 strip 1   Accu-Chek Softclix Lancets lancets USE AS DIRECTED TO CHECK SUGAR ONCE DAILY.E11.9 100 each 1   Ascorbic Acid (VITAMIN C ER PO) Take by mouth daily.     benzonatate (TESSALON) 100 MG capsule Take 1 capsule (100 mg total) by mouth 2 (two) times daily as needed for cough. 20 capsule 0   Blood Glucose Monitoring Suppl (ONETOUCH VERIO FLEX SYSTEM) w/Device KIT Use to check blood sugar once a day. DX E11.9 1 kit 0   cetirizine (ZYRTEC) 10 MG tablet Take 1 tablet (10 mg total) by mouth daily. 30 tablet 11   Cholecalciferol 4000 UNITS TABS Take 1 tablet by mouth daily.     DULoxetine (CYMBALTA) 30 MG capsule Take 30 mg by mouth 2 (two) times daily.     DULoxetine (CYMBALTA) 60 MG capsule Take 60  mg by mouth daily. 60 mg at night     DULoxetine (CYMBALTA) 60 MG capsule Take 60 mg by mouth daily.     fluticasone (FLONASE) 50 MCG/ACT nasal spray Place 2 sprays into both nostrils daily. 16 g 6   gabapentin (NEURONTIN) 100 MG capsule TAKE 2 CAPSULES BY MOUTH AT BEDTIME. 180 capsule 0   NON FORMULARY AHCC immune support     pantoprazole (PROTONIX) 40 MG tablet TAKE 1 TABLET BY MOUTH EVERY DAY 90 tablet 1   predniSONE (DELTASONE) 1 MG tablet TAKE 1 TABLET BY MOUTH EVERY DAY WITH BREAKFAST 30 tablet 2   rosuvastatin (CRESTOR) 10 MG tablet Take 1 tablet (10 mg total) by mouth daily. 90 tablet 0   thyroid (ARMOUR THYROID) 60 MG tablet TAKE ONE TABLET BY MOUTH ON MONDAY, WEDNESDAY, THURSDAY, FRIDAY, AND SUNDAY. 65 tablet 1   warfarin  (COUMADIN) 4 MG tablet TAKE 1 TABLET BY MOUTH EVERY DAY 90 tablet 0   No current facility-administered medications on file prior to visit.    No Known Allergies  Assessment/Plan:  1. Hyperlipidemia -  No problem-specific Assessment & Plan notes found for this encounter.    Thank you,  Olene Floss, Pharm.D, BCACP, CPP [facility] of [facility] [address], [place], [place] [address]  Phone: [phone]; Fax: [phone]

## 2023-04-30 NOTE — Telephone Encounter (Signed)
 Left message to call back

## 2023-05-01 ENCOUNTER — Other Ambulatory Visit (HOSPITAL_COMMUNITY): Payer: Self-pay

## 2023-05-01 ENCOUNTER — Ambulatory Visit: Payer: Medicare PPO | Attending: Cardiovascular Disease | Admitting: Pharmacist

## 2023-05-01 ENCOUNTER — Encounter: Payer: Self-pay | Admitting: Pharmacist

## 2023-05-01 ENCOUNTER — Telehealth: Payer: Self-pay | Admitting: Pharmacy Technician

## 2023-05-01 DIAGNOSIS — E785 Hyperlipidemia, unspecified: Secondary | ICD-10-CM

## 2023-05-01 MED ORDER — REPATHA SURECLICK 140 MG/ML ~~LOC~~ SOAJ: 1.0000 mL | SUBCUTANEOUS | 3 refills | Status: AC

## 2023-05-01 NOTE — Telephone Encounter (Signed)
 Jake Bathe, MD  You; Baird Lyons, RN; Gaston Islam., NP; Loni [address] [phone] hours ago (1:56 PM)    With his known coronary artery disease (Ca score 600 range) and recent CP following exertion, let's see if he can be approved for a Cardiac PET stress test. Thanks Donato Schultz, MD    Attempted to contact pt to advise of order for PET scan.  Started to leave message and call was disconnected.  Will place order and send instructions through MyChart.      Please report to Radiology at the [facility] Main Entrance 30 minutes early for your test.  [address] [place], [place] [address]           How to Prepare for Your Cardiac PET/CT Stress Test:  Nothing to eat or drink, except water, 3 hours prior to arrival time.  NO caffeine/decaffeinated products, or chocolate 12 hours prior to arrival. (Please note decaffeinated beverages (teas/coffees) still contain caffeine).  If you have caffeine within 12 hours prior, the test will need to be rescheduled.  Medication instructions: Do not take erectile dysfunction medications for 72 hours prior to test (sildenafil, tadalafil) Do not take nitrates (isosorbide mononitrate, Ranexa) the day before or day of test Do not take tamsulosin the day before or morning of test Hold theophylline containing medications for 12 hours. Hold Dipyridamole 48 hours prior to the test.  Diabetic Preparation: If able to eat breakfast prior to 3 hour fasting, you may take all medications, including your insulin. Do not worry if you miss your breakfast dose of insulin - start at your next meal. If you do not eat prior to 3 hour fast-Hold all diabetes (oral and insulin) medications. Patients who wear a continuous glucose monitor MUST remove the device prior to scanning.  You may take your remaining medications with water.  NO perfume, cologne or lotion on chest or abdomen area. FEMALES - Please avoid wearing dresses to this  appointment.  Total time is 1 to 2 hours; you may want to bring reading material for the waiting time.  IF YOU THINK YOU MAY BE PREGNANT, OR ARE NURSING PLEASE INFORM THE TECHNOLOGIST.  In preparation for your appointment, medication and supplies will be purchased.  Appointment availability is limited, so if you need to cancel or reschedule, please call the Radiology Department Scheduler at [phone] 24 hours in advance to avoid a cancellation fee of $100.00  What to Expect When you Arrive:  Once you arrive and check in for your appointment, you will be taken to a preparation room within the Radiology Department.  A technologist or Nurse will obtain your medical history, verify that you are correctly prepped for the exam, and explain the procedure.  Afterwards, an IV will be started in your arm and electrodes will be placed on your skin for EKG monitoring during the stress portion of the exam. Then you will be escorted to the PET/CT scanner.  There, staff will get you positioned on the scanner and obtain a blood pressure and EKG.  During the exam, you will continue to be connected to the EKG and blood pressure machines.  A small, safe amount of a radioactive tracer will be injected in your IV to obtain a series of pictures of your heart along with an injection of a stress agent.    After your Exam:  It is recommended that you eat a meal and drink a caffeinated beverage to counter  act any effects of the stress agent.  Drink plenty of fluids for the remainder of the day and urinate frequently for the first couple of hours after the exam.  Your doctor will inform you of your test results within 7-10 business days.  For more information and frequently asked questions, please visit our website: [email or web address]  For questions about your test or how to prepare for your test, please call: Cardiac Imaging Nurse Navigators Office: [phone]

## 2023-05-01 NOTE — Patient Instructions (Signed)
 I will submit a prior authorization for Repatha. I will call you once I hear back. Please call me at [phone] with any questions.   Repatha is a cholesterol medication that improved your body's ability to get rid of "bad cholesterol" known as LDL. It can lower your LDL up to 60%! It is an injection that is given under the skin every 2 weeks. The medication often requires a prior authorization from your insurance company. We will take care of submitting all the necessary information to your insurance company to get it approved. The most common side effects of Repatha include runny nose, symptoms of the common cold, rarely flu or flu-like symptoms, back/muscle pain in about 3-4% of the patients, and redness, pain, or bruising at the injection site. Tell your healthcare provider if you have any side effect that bothers you or that does not go away.

## 2023-05-01 NOTE — Telephone Encounter (Signed)
-----   Message from Olene Floss sent at 05/01/2023 11:25 AM EST ----- Please submit prior authorization for Repatha.  On rosuvastatin with LDL-C 129.

## 2023-05-01 NOTE — Addendum Note (Signed)
 Addended by: Malena Peer D on: 05/01/2023 02:17 PM   Modules accepted: Orders

## 2023-05-01 NOTE — Telephone Encounter (Signed)
 Pharmacy Patient Advocate Encounter  Received notification from [facility] that Prior Authorization for repatha has been APPROVED from 03/04/23 to 03/02/24. Ran test claim, Copay is $40.00. This test claim was processed through [facility]- copay amounts may vary at other pharmacies due to pharmacy/plan contracts, or as the patient moves through the different stages of their insurance plan.   PA #/Case ID/Reference #: [id number]

## 2023-05-01 NOTE — Telephone Encounter (Signed)
 Pharmacy Patient Advocate Encounter   Received notification from Physician's Office that prior authorization for Repatha is required/requested.   Insurance verification completed.   The patient is insured through [employer] .   Per test claim: PA required; PA submitted to above mentioned insurance via CoverMyMeds Key/confirmation #/EOC [id number] Status is pending

## 2023-05-01 NOTE — Assessment & Plan Note (Signed)
 Assessment: LDL-C is above goal of less than 55 due to elevated coronary calcium score and significantly elevated LP(a) and diabetes Patient is very active walking close to 3 miles every day On rosuvastatin 10 mg daily LDL-C 129 We discussed PCSK9, its side effects and potential benefit.  Reviewed injection technique.  Plan: Submit prior authorization for Repatha Labs in 3 months.  Lab slip given

## 2023-05-10 ENCOUNTER — Other Ambulatory Visit: Payer: Self-pay | Admitting: Cardiology

## 2023-05-12 ENCOUNTER — Other Ambulatory Visit: Payer: Medicare PPO | Admitting: Plastic Surgery

## 2023-05-12 DIAGNOSIS — M542 Cervicalgia: Secondary | ICD-10-CM | POA: Diagnosis not present

## 2023-05-18 ENCOUNTER — Encounter: Payer: Self-pay | Admitting: Oncology

## 2023-05-25 ENCOUNTER — Encounter (HOSPITAL_COMMUNITY): Payer: Self-pay

## 2023-05-26 DIAGNOSIS — M542 Cervicalgia: Secondary | ICD-10-CM | POA: Diagnosis not present

## 2023-05-27 ENCOUNTER — Encounter (HOSPITAL_COMMUNITY)
Admission: RE | Admit: 2023-05-27 | Discharge: 2023-05-27 | Disposition: A | Discharge status: Home / Self Care | Source: Ambulatory Visit | Attending: Cardiology | Admitting: Cardiology

## 2023-05-27 DIAGNOSIS — R072 Precordial pain: Secondary | ICD-10-CM | POA: Diagnosis not present

## 2023-05-27 MED ORDER — REGADENOSON 0.4 MG/5ML IV SOLN
INTRAVENOUS | Status: AC
  Filled 2023-05-27: qty 5

## 2023-05-27 MED ORDER — RUBIDIUM RB82 GENERATOR (RUBYFILL)
20.0000 | PACK | Freq: Once | INTRAVENOUS | Status: AC
  Administered 2023-05-27: 19.74 via INTRAVENOUS

## 2023-05-27 MED ORDER — REGADENOSON 0.4 MG/5ML IV SOLN
0.4000 mg | Freq: Once | INTRAVENOUS | Status: AC
  Administered 2023-05-27: 0.4 mg via INTRAVENOUS

## 2023-05-27 MED ORDER — RUBIDIUM RB82 GENERATOR (RUBYFILL)
20.0000 | PACK | Freq: Once | INTRAVENOUS | Status: AC
  Administered 2023-05-27: 19.75 via INTRAVENOUS

## 2023-05-28 LAB — NM PET CT CARDIAC PERFUSION MULTI W/ABSOLUTE BLOODFLOW
MBFR: 1.8
Nuc Rest EF: 48 %
Nuc Stress EF: 60 %
Peak HR: 68 {beats}/min
Rest HR: 60 {beats}/min
Rest MBF: 0.75 ml/g/min
Rest Nuclear Isotope Dose: 19.8 mCi
ST Depression (mm): 0 mm
Stress MBF: 1.35 ml/g/min
Stress Nuclear Isotope Dose: 19.7 mCi
TID: 1.08

## 2023-05-29 ENCOUNTER — Encounter: Payer: Self-pay | Admitting: Cardiology

## 2023-06-03 ENCOUNTER — Telehealth: Payer: Self-pay | Admitting: Acute Care

## 2023-06-03 NOTE — Telephone Encounter (Signed)
 Paul Ryan, I see this PT is being seen for a Lung Nodule previously with Dr. Tonia Brooms. I wanted to make sure if the Pt still needs the Ct Chest since he previously had a CTPET scan on 05/28/23.

## 2023-06-04 ENCOUNTER — Encounter: Payer: Self-pay | Admitting: Cardiology

## 2023-06-04 ENCOUNTER — Ambulatory Visit: Attending: Cardiology | Admitting: Cardiology

## 2023-06-04 ENCOUNTER — Ambulatory Visit: Admitting: Cardiology

## 2023-06-04 ENCOUNTER — Encounter: Payer: Self-pay | Admitting: Oncology

## 2023-06-04 VITALS — BP 108/74 | HR 69 | Ht 71.0 in | Wt 171.2 lb

## 2023-06-04 DIAGNOSIS — Z79899 Other long term (current) drug therapy: Secondary | ICD-10-CM | POA: Diagnosis not present

## 2023-06-04 DIAGNOSIS — I2510 Atherosclerotic heart disease of native coronary artery without angina pectoris: Secondary | ICD-10-CM

## 2023-06-04 DIAGNOSIS — R072 Precordial pain: Secondary | ICD-10-CM | POA: Diagnosis not present

## 2023-06-04 DIAGNOSIS — E785 Hyperlipidemia, unspecified: Secondary | ICD-10-CM

## 2023-06-04 NOTE — Patient Instructions (Addendum)
 Medication Instructions:  The current medical regimen is effective;  continue present plan and medications.  *If you need a refill on your cardiac medications before your next appointment, please call your pharmacy*  Lab Work: Please have blood work as instructed (BMP, CBC, PT/INR)  If you have labs (blood work) drawn today and your tests are completely normal, you will receive your results only by: MyChart Message (if you have MyChart) OR A paper copy in the mail If you have any lab test that is abnormal or we need to change your treatment, we will call you to review the results.  Testing/Procedures: Your physician has requested that you have a cardiac catheterization. Cardiac catheterization is used to diagnose and/or treat various heart conditions. Doctors may recommend this procedure for a number of different reasons. The most common reason is to evaluate chest pain. Chest pain can be a symptom of coronary artery disease (CAD), and cardiac catheterization can show whether plaque is narrowing or blocking your heart's arteries. This procedure is also used to evaluate the valves, as well as measure the blood flow and oxygen levels in different parts of your heart. For further information please visit [email or web address] Please follow instruction sheet, as given.    Follow-Up: At [facility], you and your health needs are our priority.  As part of our continuing mission to provide you with exceptional heart care, our providers are all part of one team.  This team includes your primary Cardiologist (physician) and Advanced Practice Providers or APPs (Physician Assistants and Nurse Practitioners) who all work together to provide you with the care you need, when you need it.  Your next appointment:   6 month(s)  Provider:   Donato Schultz, MD      You are scheduled for a Cardiac Catheterization on Thursday, April 10 with Dr. Alverda Skeans.  1. Please arrive at the [facility] (Main  Entrance A) at [facility]: [address] [place], [place] [address] at 10:30 AM (This time is 2 hour(s) before your procedure to ensure your preparation).   Free valet parking service is available. You will check in at ADMITTING. The support person will be asked to wait in the waiting room.  It is OK to have someone drop you off and come back when you are ready to be discharged.    Special note: Every effort is made to have your procedure done on time. Please understand that emergencies sometimes delay scheduled procedures.  2. Diet: Do not eat solid foods after midnight.  The patient may have clear liquids until 5am upon the day of the procedure.  3. Labs: You will need to have blood drawn TODAY at [facility].   4. Medication instructions in preparation for your procedure:   Contrast Allergy: No    Stop taking Coumadin (Warfarin) on Saturday, April 5.     On the morning of your procedure, take your Aspirin 81 mg and any morning medicines NOT listed above.  You may use sips of water.  5. Plan to go home the same day, you will only stay overnight if medically necessary. 6. Bring a current list of your medications and current insurance cards. 7. You MUST have a responsible person to drive you home. 8. Someone MUST be with you the first 24 hours after you arrive home or your discharge will be delayed. 9. Please wear clothes that are easy to get on and off and wear slip-on shoes.  Thank you for allowing  [place] to care for you!   -- [facility]      1st Floor: - Lobby - Registration  - Pharmacy  - Lab - Cafe  2nd Floor: - PV Lab - Diagnostic Testing (echo, CT, nuclear med)  3rd Floor: - Vacant  4th Floor: - TCTS (cardiothoracic surgery) - AFib Clinic - Structural Heart Clinic - Vascular Surgery  - Vascular Ultrasound  5th Floor: - HeartCare Cardiology (general and EP) - Clinical Pharmacy for coumadin, hypertension, lipid,  weight-loss medications, and med management appointments    Valet parking services will be available as well.

## 2023-06-04 NOTE — H&P (View-Only) (Signed)
 Cardiology Office Note:  .   Date:  06/04/2023  ID:  Paul Ryan, DOB 03-May-1945, MRN [id number] PCP: [name], MD  [facility] Providers Cardiologist:  Donato Schultz, MD    History of Present Illness: .   Paul Ryan is a 78 y.o. male Discussed the use of AI scribe software for clinical note transcription with the patient, who gave verbal consent to proceed.  History of Present Illness Paul Ryan is a 78 year old male with hyperlipidemia and protein S deficiency who presents to discuss an abnormal cardiac PET scan.  An abnormal cardiac PET scan performed on May 27, 2023, revealed abnormal left ventricular perfusion with reversible ischemia in the interlateral region and an ejection fraction of 48%.  He has a history of hyperlipidemia and was previously on Crestor 10 mg daily but has discontinued its use. He is in discussions with the lipid clinic regarding Repatha. His lipoprotein(a) level is 512, and his LDL is 123. A coronary calcium score on June 04, 2021, was 613, predominantly in the LAD distribution.  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  His family history includes a brother who recently experienced a heart attack and had two stents placed in [place].  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 He enjoys volunteering at [facility] and was active with [employer]. His wife had cancer and was followed by a hematologist there.     Studies Reviewed: [name] Kitchen   EKG Interpretation Date/Time:  Thursday June 04 2023 13:26:00 EDT Ventricular Rate:  66 PR Interval:  200 QRS Duration:  78 QT Interval:  382 QTC Calculation: 400 R Axis:   67  Text Interpretation: Normal sinus rhythm Septal infarct , age undetermined When compared with ECG of 19-Mar-2023 10:28, Septal infarct  is now Present Nonspecific T wave abnormality now evident in Lateral leads Confirmed by Donato Schultz ([id number]) on 06/04/2023 1:48:23 PM    Results LABS LP(a): 512 LDL: 123 mg/dL  RADIOLOGY Cardiac PET scan: LV perfusion abnormal with evidence of ischemia in the interlateral region, reversible. EF 48% (05/27/2023) Coronary calcium score: 613, majority in the LAD distribution (06/04/2021)  DIAGNOSTIC Bilateral lower extremity ABIs: Normal Risk Assessment/Calculations:            Physical Exam:   VS:  BP 108/74   Pulse 69   Ht 5\' 11"  (1.803 m)   Wt 171 lb 3.2 oz (77.7 kg)   SpO2 97%   BMI 23.88 kg/m    Wt Readings from Last 3 Encounters:  06/04/23 171 lb 3.2 oz (77.7 kg)  03/19/23 168 lb 3.2 oz (76.3 kg)  03/03/23 163 lb (73.9 kg)    GEN: Well nourished, well developed in no acute distress NECK: No JVD; No carotid bruits CARDIAC: RRR, no murmurs, no rubs, no gallops RESPIRATORY:  Clear to auscultation without rales, wheezing or rhonchi  ABDOMEN: Soft, non-tender, non-distended EXTREMITIES:  No edema; No deformity   ASSESSMENT AND PLAN: .    Assessment and Plan Assessment & Plan Myocardial Ischemia Abnormal cardiac PET scan on 05/27/2023 showing reversible ischemia in the interlateral region with an ejection fraction of 48%. High coronary calcium score of 613, predominantly in the LAD distribution, indicating significant coronary artery disease. Family history of heart disease with brother having a recent  myocardial infarction and stent placement. Decision to proceed with cardiac catheterization to further evaluate and potentially treat the ischemia. Risks of the procedure, including stroke, myocardial infarction, death, renal impairment, and bleeding, were discussed. He is willing to proceed with the catheterization. -Hold warfarin per protocol - Proceed with cardiac catheterization.  Hyperlipidemia Hyperlipidemia with an LDL level of 123 mg/dL. Previously on Crestor 10 mg  daily but discontinued. Elevated lipoprotein(a) level of 512. Discussion with the lipid clinic regarding Repatha as a potential treatment option.  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 History of right lower extremity DVT, likely related to the protein S deficiency. Followed by hematology for this condition.  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 His wife was followed by a hematologist at [facility] for cancer.       Informed Consent   Shared Decision Making/Informed Consent The risks [stroke (1 in 1000), death (1 in 1000), kidney failure [usually temporary] (1 in 500), bleeding (1 in 200), allergic reaction [possibly serious] (1 in 200)], benefits (diagnostic support and management of coronary artery disease) and alternatives of a cardiac catheterization were discussed in detail with Paul Ryan and he is willing to proceed.     Dispo: post cath  Signed, Donato Schultz, MD

## 2023-06-04 NOTE — Progress Notes (Signed)
 Cardiology Office Note:  .   Date:  06/04/2023  ID:  Paul Ryan, DOB 03-May-1945, MRN [id number] PCP: [name], MD  [facility] Providers Cardiologist:  Donato Schultz, MD    History of Present Illness: .   Paul Ryan is a 78 y.o. male Discussed the use of AI scribe software for clinical note transcription with the patient, who gave verbal consent to proceed.  History of Present Illness Paul Ryan is a 78 year old male with hyperlipidemia and protein S deficiency who presents to discuss an abnormal cardiac PET scan.  An abnormal cardiac PET scan performed on May 27, 2023, revealed abnormal left ventricular perfusion with reversible ischemia in the interlateral region and an ejection fraction of 48%.  He has a history of hyperlipidemia and was previously on Crestor 10 mg daily but has discontinued its use. He is in discussions with the lipid clinic regarding Repatha. His lipoprotein(a) level is 512, and his LDL is 123. A coronary calcium score on June 04, 2021, was 613, predominantly in the LAD distribution.  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  His family history includes a brother who recently experienced a heart attack and had two stents placed in [place].  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 He enjoys volunteering at [facility] and was active with Tai [employer]. His wife had cancer and was followed by a hematologist there.     Studies Reviewed: [name] Kitchen   EKG Interpretation Date/Time:  Thursday June 04 2023 13:26:00 EDT Ventricular Rate:  66 PR Interval:  200 QRS Duration:  78 QT Interval:  382 QTC Calculation: 400 R Axis:   67  Text Interpretation: Normal sinus rhythm Septal infarct , age undetermined When compared with ECG of 19-Mar-2023 10:28, Septal infarct  is now Present Nonspecific T wave abnormality now evident in Lateral leads Confirmed by Donato Schultz ([id number]) on 06/04/2023 1:48:23 PM    Results LABS LP(a): 512 LDL: 123 mg/dL  RADIOLOGY Cardiac PET scan: LV perfusion abnormal with evidence of ischemia in the interlateral region, reversible. EF 48% (05/27/2023) Coronary calcium score: 613, majority in the LAD distribution (06/04/2021)  DIAGNOSTIC Bilateral lower extremity ABIs: Normal Risk Assessment/Calculations:            Physical Exam:   VS:  BP 108/74   Pulse 69   Ht 5\' 11"  (1.803 m)   Wt 171 lb 3.2 oz (77.7 kg)   SpO2 97%   BMI 23.88 kg/m    Wt Readings from Last 3 Encounters:  06/04/23 171 lb 3.2 oz (77.7 kg)  03/19/23 168 lb 3.2 oz (76.3 kg)  03/03/23 163 lb (73.9 kg)    GEN: Well nourished, well developed in no acute distress NECK: No JVD; No carotid bruits CARDIAC: RRR, no murmurs, no rubs, no gallops RESPIRATORY:  Clear to auscultation without rales, wheezing or rhonchi  ABDOMEN: Soft, non-tender, non-distended EXTREMITIES:  No edema; No deformity   ASSESSMENT AND PLAN: .    Assessment and Plan Assessment & Plan Myocardial Ischemia Abnormal cardiac PET scan on 05/27/2023 showing reversible ischemia in the interlateral region with an ejection fraction of 48%. High coronary calcium score of 613, predominantly in the LAD distribution, indicating significant coronary artery disease. Family history of heart disease with brother having a recent  myocardial infarction and stent placement. Decision to proceed with cardiac catheterization to further evaluate and potentially treat the ischemia. Risks of the procedure, including stroke, myocardial infarction, death, renal impairment, and bleeding, were discussed. He is willing to proceed with the catheterization. -Hold warfarin per protocol - Proceed with cardiac catheterization.  Hyperlipidemia Hyperlipidemia with an LDL level of 123 mg/dL. Previously on Crestor 10 mg  daily but discontinued. Elevated lipoprotein(a) level of 512. Discussion with the lipid clinic regarding Repatha as a potential treatment option.  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 History of right lower extremity DVT, likely related to the protein S deficiency. Followed by hematology for this condition.  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 His wife was followed by a hematologist at [facility] for cancer.       Informed Consent   Shared Decision Making/Informed Consent The risks [stroke (1 in 1000), death (1 in 1000), kidney failure [usually temporary] (1 in 500), bleeding (1 in 200), allergic reaction [possibly serious] (1 in 200)], benefits (diagnostic support and management of coronary artery disease) and alternatives of a cardiac catheterization were discussed in detail with Mr. Chance and he is willing to proceed.     Dispo: post cath  Signed, Donato Schultz, MD

## 2023-06-04 NOTE — Addendum Note (Signed)
 Addended by: Jake Bathe on: 06/04/2023 04:54 PM   Modules accepted: Orders

## 2023-06-05 ENCOUNTER — Ambulatory Visit (INDEPENDENT_AMBULATORY_CARE_PROVIDER_SITE_OTHER): Payer: Self-pay | Admitting: Plastic Surgery

## 2023-06-05 ENCOUNTER — Encounter: Payer: Self-pay | Admitting: Plastic Surgery

## 2023-06-05 ENCOUNTER — Encounter: Payer: Self-pay | Admitting: Oncology

## 2023-06-05 ENCOUNTER — Encounter: Payer: Self-pay | Admitting: Cardiology

## 2023-06-05 DIAGNOSIS — Z719 Counseling, unspecified: Secondary | ICD-10-CM

## 2023-06-05 LAB — BASIC METABOLIC PANEL WITH GFR
BUN/Creatinine Ratio: 14 (ref 10–24)
BUN: 16 mg/dL (ref 8–27)
CO2: 21 mmol/L (ref 20–29)
Calcium: 9.8 mg/dL (ref 8.6–10.2)
Chloride: 103 mmol/L (ref 96–106)
Creatinine, Ser: 1.15 mg/dL (ref 0.76–1.27)
Glucose: 107 mg/dL — ABNORMAL HIGH (ref 70–99)
Potassium: 4.8 mmol/L (ref 3.5–5.2)
Sodium: 143 mmol/L (ref 134–144)
eGFR: 65 mL/min/{1.73_m2} (ref >59)

## 2023-06-05 LAB — CBC
Hematocrit: 45.6 % (ref 37.5–51.0)
Hemoglobin: 15.3 g/dL (ref 13.0–17.7)
MCH: 29.4 pg (ref 26.6–33.0)
MCHC: 33.6 g/dL (ref 31.5–35.7)
MCV: 88 fL (ref 79–97)
Platelets: 257 10*3/uL (ref 150–450)
RBC: 5.2 x10E6/uL (ref 4.14–5.80)
RDW: 13.4 % (ref 11.6–15.4)
WBC: 8.4 10*3/uL (ref 3.4–10.8)

## 2023-06-05 LAB — PROTIME-INR
INR: 2.4 — ABNORMAL HIGH (ref 0.9–1.2)
Prothrombin Time: 25.6 s — ABNORMAL HIGH (ref 9.1–12.0)

## 2023-06-05 NOTE — Progress Notes (Signed)
 Preoperative Dx: Hyperpigmentation of face  Postoperative Dx:  same  Procedure: laser to face  Anesthesia: none  Description of Procedure:  Risks and complications were explained to the patient. Consent was confirmed and signed. Eye protection was placed. Time out was called and all information was confirmed to be correct. The area  area was prepped with alcohol and wiped dry. The heroic laser was set at 590 nm and 5 J/cm2 for the base and then 560 nm for the spots. The face was lasered. The patient tolerated the procedure well and there were no complications. The patient is to follow up in 4 weeks.

## 2023-06-07 ENCOUNTER — Encounter: Payer: Self-pay | Admitting: Family Medicine

## 2023-06-08 ENCOUNTER — Ambulatory Visit: Payer: Medicare PPO | Admitting: Diagnostic Neuroimaging

## 2023-06-09 ENCOUNTER — Telehealth: Payer: Self-pay | Admitting: *Deleted

## 2023-06-09 ENCOUNTER — Ambulatory Visit (HOSPITAL_COMMUNITY)

## 2023-06-09 ENCOUNTER — Encounter: Payer: Self-pay | Admitting: *Deleted

## 2023-06-09 ENCOUNTER — Telehealth: Payer: Self-pay | Admitting: Cardiology

## 2023-06-09 MED ORDER — ENOXAPARIN SODIUM 120 MG/0.8ML IJ SOSY: 120.0000 mg | PREFILLED_SYRINGE | INTRAMUSCULAR | 0 refills | Status: DC

## 2023-06-09 NOTE — Telephone Encounter (Signed)
 I spoke with patient-see other phone note from today.

## 2023-06-09 NOTE — Telephone Encounter (Addendum)
 Cardiac Catheterization scheduled at [facility] for: Thursday June 11, 2023 12:30 PM Arrival time [facility] Main Entrance A at: 10:30 AM  Nothing to eat after midnight prior to procedure, clear liquids until 5 AM day of procedure.  Medication instructions: -Hold:  Warfarin/Lovenox bridge-pt has not taken warfarin since 06/06/23 and knows to hold until post procedure.  He tells me he has received instructions for Lovenox to take 4/8, 4/9, 4/11, 4/12 and 4/13, restart warfarin evening of 4/10 after procedure. -Other usual morning medications can be taken with sips of water including aspirin 81 mg.  Plan to go home the same day, you will only stay overnight if medically necessary.  You must have responsible adult to drive you home.  Someone must be with you the first 24 hours after you arrive home.  Reviewed procedure instructions with patient.

## 2023-06-09 NOTE — Telephone Encounter (Signed)
 Patient is calling stating that he stopped his coumadin on Saturday as advised for his procedure. His INR this morning read 1.2. He reports he is waiting to hear back from his hematologist regarding it, but he normally bridges with lovenox. He just wanted to make the office aware incase this would change anything regarding his procedure.   Patient left his wife's number for callback due to having spam blocker on his phone, and not knowing how to take it off.   He is requesting a callback in at least 15 mins from now to give him time to get back to her to answer the call, due to being on a walk at this time.  Please advise.

## 2023-06-09 NOTE — Telephone Encounter (Signed)
 Checked the patient's chart and appointment has already been made

## 2023-06-09 NOTE — Telephone Encounter (Signed)
 Left a message for the pt to call back... will send to Pam/ Thurston Hole for [place].

## 2023-06-09 NOTE — Telephone Encounter (Signed)
 Thanks Revonda Standard. Pt is aware of CT appointment and follow up!

## 2023-06-11 ENCOUNTER — Encounter (HOSPITAL_COMMUNITY)
Admission: RE | Disposition: A | Discharge status: Home / Self Care | Source: Home / Self Care | Attending: Internal Medicine

## 2023-06-11 ENCOUNTER — Ambulatory Visit (HOSPITAL_COMMUNITY)

## 2023-06-11 ENCOUNTER — Ambulatory Visit (HOSPITAL_COMMUNITY)
Admission: RE | Admit: 2023-06-11 | Discharge: 2023-06-11 | Disposition: A | Discharge status: Home / Self Care | Attending: Internal Medicine | Admitting: Internal Medicine

## 2023-06-11 ENCOUNTER — Other Ambulatory Visit: Payer: Self-pay

## 2023-06-11 DIAGNOSIS — Z7901 Long term (current) use of anticoagulants: Secondary | ICD-10-CM | POA: Insufficient documentation

## 2023-06-11 DIAGNOSIS — E785 Hyperlipidemia, unspecified: Secondary | ICD-10-CM | POA: Diagnosis not present

## 2023-06-11 DIAGNOSIS — Z01812 Encounter for preprocedural laboratory examination: Secondary | ICD-10-CM

## 2023-06-11 DIAGNOSIS — Z79899 Other long term (current) drug therapy: Secondary | ICD-10-CM | POA: Insufficient documentation

## 2023-06-11 DIAGNOSIS — I25110 Atherosclerotic heart disease of native coronary artery with unstable angina pectoris: Secondary | ICD-10-CM | POA: Diagnosis not present

## 2023-06-11 DIAGNOSIS — R9439 Abnormal result of other cardiovascular function study: Secondary | ICD-10-CM | POA: Diagnosis present

## 2023-06-11 DIAGNOSIS — I2510 Atherosclerotic heart disease of native coronary artery without angina pectoris: Secondary | ICD-10-CM | POA: Diagnosis not present

## 2023-06-11 DIAGNOSIS — R072 Precordial pain: Secondary | ICD-10-CM

## 2023-06-11 DIAGNOSIS — D6859 Other primary thrombophilia: Secondary | ICD-10-CM | POA: Insufficient documentation

## 2023-06-11 DIAGNOSIS — R918 Other nonspecific abnormal finding of lung field: Secondary | ICD-10-CM | POA: Insufficient documentation

## 2023-06-11 DIAGNOSIS — Z8249 Family history of ischemic heart disease and other diseases of the circulatory system: Secondary | ICD-10-CM | POA: Insufficient documentation

## 2023-06-11 HISTORY — PX: LEFT HEART CATH AND CORONARY ANGIOGRAPHY: CATH118249

## 2023-06-11 LAB — ECHOCARDIOGRAM COMPLETE
Area-P 1/2: 3.37 cm2
Calc EF: 60.1 %
Height: 71 in
S' Lateral: 2.6 cm
Single Plane A2C EF: 62.5 %
Single Plane A4C EF: 63.2 %
Weight: 2736 [oz_av]

## 2023-06-11 LAB — GLUCOSE, CAPILLARY: Glucose-Capillary: 101 mg/dL — ABNORMAL HIGH (ref 70–99)

## 2023-06-11 LAB — PROTIME-INR
INR: 1 (ref 0.8–1.2)
Prothrombin Time: 13.6 s (ref 11.4–15.2)

## 2023-06-11 SURGERY — LEFT HEART CATH AND CORONARY ANGIOGRAPHY
Anesthesia: LOCAL

## 2023-06-11 MED ORDER — ACETAMINOPHEN 325 MG PO TABS: 650.0000 mg | ORAL_TABLET | ORAL | Status: DC | PRN

## 2023-06-11 MED ORDER — VERAPAMIL HCL 2.5 MG/ML IV SOLN
INTRAVENOUS | Status: AC
  Filled 2023-06-11: qty 2

## 2023-06-11 MED ORDER — SODIUM CHLORIDE 0.9 % WEIGHT BASED INFUSION: 1.0000 mL/kg/h | INTRAVENOUS | Status: DC

## 2023-06-11 MED ORDER — MIDAZOLAM HCL 2 MG/2ML IJ SOLN
INTRAMUSCULAR | Status: DC | PRN
  Administered 2023-06-11: 1 mg via INTRAVENOUS

## 2023-06-11 MED ORDER — HYDRALAZINE HCL 20 MG/ML IJ SOLN: 10.0000 mg | INTRAMUSCULAR | Status: DC | PRN

## 2023-06-11 MED ORDER — VERAPAMIL HCL 2.5 MG/ML IV SOLN
INTRAVENOUS | Status: AC
Start: 2023-06-11 — End: ?
  Filled 2023-06-11: qty 2

## 2023-06-11 MED ORDER — ASPIRIN 81 MG PO CHEW: 81.0000 mg | CHEWABLE_TABLET | Freq: Every day | ORAL | Status: DC

## 2023-06-11 MED ORDER — SODIUM CHLORIDE 0.9 % WEIGHT BASED INFUSION: 3.0000 mL/kg/h | INTRAVENOUS | Status: DC

## 2023-06-11 MED ORDER — ASPIRIN 81 MG PO CHEW
81.0000 mg | CHEWABLE_TABLET | ORAL | Status: DC
Start: 2023-06-11 — End: 2023-06-11

## 2023-06-11 MED ORDER — ASPIRIN 81 MG PO CHEW: 81.0000 mg | CHEWABLE_TABLET | ORAL | Status: DC

## 2023-06-11 MED ORDER — SODIUM CHLORIDE 0.9 % IV SOLN: 250.0000 mL | INTRAVENOUS | Status: DC | PRN

## 2023-06-11 MED ORDER — SODIUM CHLORIDE 0.9% FLUSH: 3.0000 mL | INTRAVENOUS | Status: DC | PRN

## 2023-06-11 MED ORDER — HEPARIN SODIUM (PORCINE) 1000 UNIT/ML IJ SOLN
INTRAMUSCULAR | Status: DC | PRN
  Administered 2023-06-11: 5000 [IU] via INTRA_ARTERIAL

## 2023-06-11 MED ORDER — FENTANYL CITRATE (PF) 100 MCG/2ML IJ SOLN
INTRAMUSCULAR | Status: DC | PRN
  Administered 2023-06-11: 25 ug via INTRAVENOUS

## 2023-06-11 MED ORDER — LIDOCAINE HCL (PF) 1 % IJ SOLN
INTRAMUSCULAR | Status: DC | PRN
  Administered 2023-06-11: 2 mL

## 2023-06-11 MED ORDER — MIDAZOLAM HCL 2 MG/2ML IJ SOLN
INTRAMUSCULAR | Status: AC
Start: 2023-06-11 — End: ?
  Filled 2023-06-11: qty 2

## 2023-06-11 MED ORDER — VERAPAMIL HCL 2.5 MG/ML IV SOLN
INTRAVENOUS | Status: DC | PRN
  Administered 2023-06-11: 10 mL via INTRA_ARTERIAL

## 2023-06-11 MED ORDER — HEPARIN SODIUM (PORCINE) 1000 UNIT/ML IJ SOLN
INTRAMUSCULAR | Status: AC
  Filled 2023-06-11: qty 10

## 2023-06-11 MED ORDER — LIDOCAINE HCL (PF) 1 % IJ SOLN
INTRAMUSCULAR | Status: AC
  Filled 2023-06-11: qty 30

## 2023-06-11 MED ORDER — HEPARIN (PORCINE) IN NACL 1000-0.9 UT/500ML-% IV SOLN
INTRAVENOUS | Status: DC | PRN
  Administered 2023-06-11: 1000 mL via INTRA_ARTERIAL

## 2023-06-11 MED ORDER — FENTANYL CITRATE (PF) 100 MCG/2ML IJ SOLN
INTRAMUSCULAR | Status: AC
  Filled 2023-06-11: qty 2

## 2023-06-11 MED ORDER — IOHEXOL 350 MG/ML SOLN
INTRAVENOUS | Status: DC | PRN
  Administered 2023-06-11: 30 mL via INTRA_ARTERIAL

## 2023-06-11 MED ORDER — SODIUM CHLORIDE 0.9% FLUSH: 3.0000 mL | Freq: Two times a day (BID) | INTRAVENOUS | Status: DC

## 2023-06-11 MED ORDER — ONDANSETRON HCL 4 MG/2ML IJ SOLN: 4.0000 mg | Freq: Four times a day (QID) | INTRAMUSCULAR | Status: DC | PRN

## 2023-06-11 MED ORDER — LABETALOL HCL 5 MG/ML IV SOLN: 10.0000 mg | INTRAVENOUS | Status: DC | PRN

## 2023-06-11 SURGICAL SUPPLY — 7 items
CATH INFINITI 5FR ANG PIGTAIL (CATHETERS) IMPLANT
CATH INFINITI AMBI 6FR TG (CATHETERS) IMPLANT
DEVICE RAD COMP TR BAND LRG (VASCULAR PRODUCTS) IMPLANT
GLIDESHEATH SLEND SS 6F .021 (SHEATH) IMPLANT
PACK CARDIAC CATHETERIZATION (CUSTOM PROCEDURE TRAY) ×1 IMPLANT
SET ATX-X65L (MISCELLANEOUS) IMPLANT
WIRE EMERALD 3MM-J .035X260CM (WIRE) IMPLANT

## 2023-06-11 NOTE — Progress Notes (Signed)
  Echocardiogram 2D Echocardiogram has been performed.  Paul Ryan 06/11/2023, 3:44 PM

## 2023-06-11 NOTE — Discharge Instructions (Signed)

## 2023-06-11 NOTE — Interval H&P Note (Signed)
 History and Physical Interval Note:  06/11/2023 11:25 AM  Paul Ryan  has presented today for surgery, with the diagnosis of abnormal stress.  The various methods of treatment have been discussed with the patient and family. After consideration of risks, benefits and other options for treatment, the patient has consented to  Procedure(s): LEFT HEART CATH AND CORONARY ANGIOGRAPHY (N/A) as a surgical intervention.  The patient's history has been reviewed, patient examined, no change in status, stable for surgery.  I have reviewed the patient's chart and labs.  Questions were answered to the patient's satisfaction.     Orbie Pyo

## 2023-06-12 ENCOUNTER — Encounter (HOSPITAL_COMMUNITY): Payer: Self-pay | Admitting: Internal Medicine

## 2023-06-13 ENCOUNTER — Ambulatory Visit (HOSPITAL_BASED_OUTPATIENT_CLINIC_OR_DEPARTMENT_OTHER)
Admission: RE | Admit: 2023-06-13 | Discharge: 2023-06-13 | Disposition: A | Discharge status: Home / Self Care | Source: Ambulatory Visit | Attending: Pulmonary Disease | Admitting: Pulmonary Disease

## 2023-06-13 DIAGNOSIS — R911 Solitary pulmonary nodule: Secondary | ICD-10-CM | POA: Diagnosis not present

## 2023-06-13 DIAGNOSIS — Z8249 Family history of ischemic heart disease and other diseases of the circulatory system: Secondary | ICD-10-CM | POA: Diagnosis not present

## 2023-06-13 DIAGNOSIS — Q2546 Tortuous aortic arch: Secondary | ICD-10-CM | POA: Diagnosis not present

## 2023-06-13 DIAGNOSIS — I25110 Atherosclerotic heart disease of native coronary artery with unstable angina pectoris: Secondary | ICD-10-CM | POA: Diagnosis not present

## 2023-06-13 DIAGNOSIS — R918 Other nonspecific abnormal finding of lung field: Secondary | ICD-10-CM | POA: Diagnosis not present

## 2023-06-13 DIAGNOSIS — Z79899 Other long term (current) drug therapy: Secondary | ICD-10-CM | POA: Diagnosis not present

## 2023-06-13 DIAGNOSIS — D6859 Other primary thrombophilia: Secondary | ICD-10-CM | POA: Diagnosis not present

## 2023-06-13 DIAGNOSIS — J439 Emphysema, unspecified: Secondary | ICD-10-CM | POA: Diagnosis not present

## 2023-06-13 DIAGNOSIS — E785 Hyperlipidemia, unspecified: Secondary | ICD-10-CM | POA: Diagnosis not present

## 2023-06-13 DIAGNOSIS — Z7901 Long term (current) use of anticoagulants: Secondary | ICD-10-CM | POA: Diagnosis not present

## 2023-06-16 ENCOUNTER — Telehealth (INDEPENDENT_AMBULATORY_CARE_PROVIDER_SITE_OTHER): Admitting: Family Medicine

## 2023-06-16 ENCOUNTER — Encounter: Payer: Self-pay | Admitting: Family Medicine

## 2023-06-16 VITALS — BP 120/85

## 2023-06-16 DIAGNOSIS — E559 Vitamin D deficiency, unspecified: Secondary | ICD-10-CM | POA: Diagnosis not present

## 2023-06-16 DIAGNOSIS — E039 Hypothyroidism, unspecified: Secondary | ICD-10-CM

## 2023-06-16 DIAGNOSIS — R739 Hyperglycemia, unspecified: Secondary | ICD-10-CM

## 2023-06-16 DIAGNOSIS — E785 Hyperlipidemia, unspecified: Secondary | ICD-10-CM | POA: Diagnosis not present

## 2023-06-16 DIAGNOSIS — E1169 Type 2 diabetes mellitus with other specified complication: Secondary | ICD-10-CM

## 2023-06-16 DIAGNOSIS — E538 Deficiency of other specified B group vitamins: Secondary | ICD-10-CM | POA: Diagnosis not present

## 2023-06-16 NOTE — Assessment & Plan Note (Signed)
 hgba1c acceptable, minimize simple carbs. Increase exercise as tolerated. Continue current meds labs reordered. Consider adding Farxiga or Jardiance after surgery. Needs protein intake every 3-4 hours while awake.

## 2023-06-16 NOTE — Progress Notes (Signed)
 MyChart Video Visit    Virtual Visit via Video Note   This patient is at least at moderate risk for complications without adequate follow up. This format is felt to be most appropriate for this patient at this time. Physical exam was limited by quality of the video and audio technology used for the visit. Paul Ryan, CMA was able to get the patient set up on a video visit.  Patient location: home Patient and provider in visit Provider location: Office  I discussed the limitations of evaluation and management by telemedicine and the availability of in person appointments. The patient expressed understanding and agreed to proceed.  Visit Date: 06/16/2023  Today's healthcare provider: Danise Edge, MD  Subjective:    Patient ID: Paul Ryan, male    DOB: 28-Oct-1945, 78 y.o.   MRN: [id number]  Chief Complaint  Patient presents with   Follow-up    HPI Discussed the use of AI scribe software for clinical note transcription with the patient, who gave verbal consent to proceed.  History of Present Illness Paul Ryan is a 78 year old male with prediabetes and known heart disease who presents with chest pain.  Three weeks ago, he experienced chest pain described as 'quite tight' while exerting himself working on the [employer]. The pain was not severe enough to warrant an immediate hospital visit but was different from his usual fatigue. Since then, he has continued to walk regularly, about three miles in 30 to 40 minutes, but recently experienced pain after walking less than a quarter of the way, prompting him to stop.  He has a history of early diabetes. Recent glucose tests showed high levels, with a glucose reading of 107 earlier this week. His last A1c was 6.7, indicating early or diet-controlled diabetes. At home, his glucose readings have ranged from 112 to 147. He uses a glucometer to monitor his blood sugar levels, checking daily and as needed based on symptoms.  He has started on  Repatha to manage his cholesterol levels. He is aware of his heart disease, cholesterol issues, and clotting disorder, and is mindful of the need to manage his blood sugar levels to prevent complications.    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   Hyperglycemia 12/26/2014   Hyperlipidemia    Hypothyroid 06/10/2011   Insomnia 04/24/2016   Left hip pain 12/17/2010   MCI (mild cognitive impairment) 07/22/2016   Nasal lesion 07/22/2016   Polymyalgia ([name]) 03/21/2014   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   Preventative health care 12/17/2010   Shingles 1982   right abdominal wall   Staph skin infection 07/22/2016   Thyroid nodule 11/29/2014   Urinary hesitancy 09/30/2016   Vitamin D deficiency 10/14/2011    Past Surgical History:  Procedure Laterality Date   LEFT HEART CATH AND CORONARY ANGIOGRAPHY N/A 06/11/2023   Procedure: LEFT HEART CATH AND CORONARY ANGIOGRAPHY;  Surgeon: Orbie Pyo, MD;  Location: [facility];  Service: Cardiovascular;  Laterality: N/A;   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   4 surgeries    Family History  Problem Relation Age  of Onset   Cancer Mother        unknown- everywhere   Cancer Sister        breast    Social History   Socioeconomic History   Marital status: Married    Spouse name: Not on file   Number of children: Not on file   Years of education: Not on file   Highest education level: Not on file  Occupational History   Not on file  Tobacco Use   Smoking status: Former    Current packs/day: 0.00    Average packs/day: 1 pack/day for 20.0 years (20.0 ttl pk-yrs)    Types:  Cigarettes    Start date: 03/03/1960    Quit date: 03/03/1980    Years since quitting: 43.3   Smokeless tobacco: Never  Substance and Sexual Activity   Alcohol use: No    Alcohol/week: 0.0 standard drinks of alcohol   Drug use: No   Sexual activity: Not on file  Other Topics Concern   Not on file  Social History Narrative   Not on file   Social Drivers of Health   Financial Resource Strain: Low Risk  (03/03/2023)   Overall Financial Resource Strain (CARDIA)    Difficulty of Paying Living Expenses: Not hard at all  Food Insecurity: No Food Insecurity (03/03/2023)   Hunger Vital Sign    Worried About Running Out of Food in the Last Year: Never true    Ran Out of Food in the Last Year: Never true  Transportation Needs: No Transportation Needs (03/03/2023)   PRAPARE - Administrator, Civil Service (Medical): No    Lack of Transportation (Non-Medical): No  Physical Activity: Sufficiently Active (03/03/2023)   Exercise Vital Sign    Days of Exercise per Week: 5 days    Minutes of Exercise per Session: 30 min  Stress: No Stress Concern Present (03/03/2023)   [employer] of Occupational Health - Occupational Stress Questionnaire    Feeling of Stress : Not at all  Social Connections: Moderately Isolated (03/03/2023)   Social Connection and Isolation Panel [NHANES]    Frequency of Communication with Friends and Family: More than three times a week    Frequency of Social Gatherings with Friends and Family: More than three times a week    Attends Religious Services: Never    Database administrator or Organizations: No    Attends Banker Meetings: Never    Marital Status: Married  Catering manager Violence: Not At Risk (03/03/2023)   Humiliation, Afraid, Rape, and Kick questionnaire    Fear of Current or Ex-Partner: No    Emotionally Abused: No    Physically Abused: No    Sexually Abused: No    Outpatient Medications Prior to Visit  Medication Sig  Dispense Refill   ACCU-CHEK GUIDE TEST test strip USE TO CHECK BLOOD SUGAR ONCE DAILY . E11.9 100 strip 1   Accu-Chek Softclix Lancets lancets USE AS DIRECTED TO CHECK SUGAR ONCE DAILY.E11.9 100 each 1   ascorbic acid (VITAMIN C) 500 MG tablet Take 500 mg by mouth daily.     Blood Glucose Monitoring Suppl (ONETOUCH VERIO FLEX SYSTEM) w/Device KIT Use to check blood sugar once a day. DX E11.9 1 kit 0   cetirizine (ZYRTEC) 10 MG tablet Take 10 mg by mouth daily as needed for allergies.     Cholecalciferol 4000 UNITS TABS Take 1 tablet by mouth daily.     Coenzyme Q10 (COQ10)  100 MG CAPS Take 100 mg by mouth daily.     cyanocobalamin (VITAMIN B12) 1000 MCG tablet Take 1,000 mcg by mouth daily.     DULoxetine (CYMBALTA) 30 MG capsule Take 30 mg by mouth at bedtime.     DULoxetine (CYMBALTA) 60 MG capsule Take 60 mg by mouth daily.     enoxaparin (LOVENOX) 120 MG/0.8ML injection Inject 0.8 mLs (120 mg total) into the skin daily. Inject on 4/09 and on 4/11, 4/12 and 4/13 3.2 mL 0   Evolocumab (REPATHA SURECLICK) 140 MG/ML SOAJ Inject 140 mg into the skin every 14 (fourteen) days. 6 mL 3   fluticasone (FLONASE) 50 MCG/ACT nasal spray Place 2 sprays into both nostrils daily. (Patient taking differently: Place 2 sprays into both nostrils daily as needed for allergies.) 16 g 6   gabapentin (NEURONTIN) 100 MG capsule TAKE 2 CAPSULES BY MOUTH AT BEDTIME. (Patient taking differently: Take 200 mg by mouth at bedtime as needed (nerve pain).) 180 capsule 0   pantoprazole (PROTONIX) 40 MG tablet TAKE 1 TABLET BY MOUTH EVERY DAY 90 tablet 1   predniSONE (DELTASONE) 1 MG tablet TAKE 1 TABLET BY MOUTH EVERY DAY WITH BREAKFAST (Patient taking differently: Take 1 mg by mouth daily as needed (Pain).) 30 tablet 2   rosuvastatin (CRESTOR) 10 MG tablet TAKE 1 TABLET BY MOUTH EVERY DAY 90 tablet 3   thyroid (ARMOUR THYROID) 60 MG tablet TAKE ONE TABLET BY MOUTH ON MONDAY, WEDNESDAY, THURSDAY, FRIDAY, AND SUNDAY. (Patient  taking differently: Take 60 mg by mouth daily before breakfast.) 65 tablet 1   warfarin (COUMADIN) 4 MG tablet TAKE 1 TABLET BY MOUTH EVERY DAY (Patient taking differently: Take 2-4 mg by mouth See admin instructions. 4 mg on Sun Mon Wed Fri and 2 mg Tues Thurs and Sat) 90 tablet 0   zinc gluconate 50 MG tablet Take 50 mg by mouth daily.     No facility-administered medications prior to visit.    No Known Allergies  Review of Systems  Constitutional:  Positive for malaise/fatigue. Negative for fever.  HENT:  Negative for congestion.   Eyes:  Negative for blurred vision.  Respiratory:  Negative for shortness of breath.   Cardiovascular:  Positive for chest pain. Negative for palpitations and leg swelling.  Gastrointestinal:  Negative for abdominal pain, blood in stool and nausea.  Genitourinary:  Negative for dysuria and frequency.  Musculoskeletal:  Negative for falls.  Skin:  Negative for rash.  Neurological:  Negative for dizziness, loss of consciousness and headaches.  Endo/Heme/Allergies:  Negative for environmental allergies.  Psychiatric/Behavioral:  Negative for depression. The patient is not nervous/anxious.        Objective:    Physical Exam Constitutional:      General: He is not in acute distress.    Appearance: Normal appearance. He is not ill-appearing or toxic-appearing.  HENT:     Head: Normocephalic and atraumatic.     Right Ear: External ear normal.     Left Ear: External ear normal.     Nose: Nose normal.  Eyes:     General:        Right eye: No discharge.        Left eye: No discharge.  Pulmonary:     Effort: Pulmonary effort is normal.  Skin:    Findings: No rash.  Neurological:     Mental Status: He is alert and oriented to person, place, and time.  Psychiatric:  Behavior: Behavior normal.     BP 120/85 Comment: 3 days ago Wt Readings from Last 3 Encounters:  06/11/23 171 lb (77.6 kg)  06/04/23 171 lb 3.2 oz (77.7 kg)  03/19/23 168 lb  3.2 oz (76.3 kg)    Diabetic Foot Exam - Simple   No data filed    Lab Results  Component Value Date   WBC 8.4 06/04/2023   HGB 15.3 06/04/2023   HCT 45.6 06/04/2023   PLT 257 06/04/2023   GLUCOSE 107 (H) 06/04/2023   CHOL 201 (H) 04/14/2022   TRIG 174.0 (H) 04/14/2022   HDL 36.90 (L) 04/14/2022   LDLDIRECT 156.0 12/17/2021   LDLCALC 129 (H) 04/14/2022   ALT 27 08/14/2022   AST 28 08/14/2022   NA 143 06/04/2023   K 4.8 06/04/2023   CL 103 06/04/2023   CREATININE 1.15 06/04/2023   BUN 16 06/04/2023   CO2 21 06/04/2023   TSH 1.34 08/14/2022   PSA 4.29 (H) 06/20/2020   INR 1.0 06/11/2023   HGBA1C 6.7 (H) 08/14/2022   MICROALBUR <0.7 08/14/2022    Lab Results  Component Value Date   TSH 1.34 08/14/2022   Lab Results  Component Value Date   WBC 8.4 06/04/2023   HGB 15.3 06/04/2023   HCT 45.6 06/04/2023   MCV 88 06/04/2023   PLT 257 06/04/2023   Lab Results  Component Value Date   NA 143 06/04/2023   K 4.8 06/04/2023   CHLORIDE 109 11/16/2012   CO2 21 06/04/2023   GLUCOSE 107 (H) 06/04/2023   BUN 16 06/04/2023   CREATININE 1.15 06/04/2023   BILITOT 0.6 08/14/2022   ALKPHOS 59 08/14/2022   AST 28 08/14/2022   ALT 27 08/14/2022   PROT 7.0 08/14/2022   ALBUMIN 4.1 08/14/2022   CALCIUM 9.8 06/04/2023   ANIONGAP 8 09/11/2014   EGFR 65 06/04/2023   GFR 63.84 08/14/2022   Lab Results  Component Value Date   CHOL 201 (H) 04/14/2022   Lab Results  Component Value Date   HDL 36.90 (L) 04/14/2022   Lab Results  Component Value Date   LDLCALC 129 (H) 04/14/2022   Lab Results  Component Value Date   TRIG 174.0 (H) 04/14/2022   Lab Results  Component Value Date   CHOLHDL 5 04/14/2022   Lab Results  Component Value Date   HGBA1C 6.7 (H) 08/14/2022       Assessment & Plan:  B12 deficiency -     CBC with Differential/Platelet; Future -     Vitamin B12; Future  Hyperlipidemia, unspecified hyperlipidemia type -     Lipid panel;  Future  Hypothyroidism, unspecified type -     TSH; Future  Vitamin D deficiency -     VITAMIN D 25 Hydroxy (Vit-D Deficiency, Fractures); Future  Type 2 diabetes mellitus with hyperlipidemia ([name]) Assessment & Plan: hgba1c acceptable, minimize simple carbs. Increase exercise as tolerated. Continue current meds labs reordered. Consider adding Marcelline Deist or Jardiance after surgery. Needs protein intake every 3-4 hours while awake.   Orders: -     Comprehensive metabolic panel with GFR; Future -     Hemoglobin A1c; Future  Hyperglycemia    Assessment and Plan Assessment & Plan Chest Pain Intermittent exertional chest pain with known heart disease. Cardiologist evaluation pending. - Advise to avoid exertion until cardiologist evaluation. - Follow up with cardiologist Dr. Nonnie Done next Tuesday.  Type 2 Diabetes Mellitus A1c at 6.7 indicates early diabetes. Discussed diet and monitoring. Consideration  of Jardiance or Farxiga post-surgery with cardiologist consultation. - Order labs: A1c, cholesterol, thyroid, and B12. - Advise daily glucose monitoring, focusing on fasting and postprandial levels. - Discuss potential initiation of Jardiance or Farxiga with cardiologist post-surgery. - Emphasize dietary management with focus on protein intake and pairing carbohydrates with protein.  Hyperlipidemia On Repatha as prescribed by cardiologist to manage cholesterol levels. - Continue Repatha as prescribed by cardiologist.  General Health Maintenance Encouraged safe physical activity given recent chest pain. Active in volunteer work. - Encourage safe levels of physical activity. - Support volunteer activities.  Follow-up Scheduled for surgery with Dr. Sherene Dilling. Plan for post-surgery follow-ups. - Schedule virtual follow-up visit in 4-6 weeks post-surgery. - Schedule in-person follow-up visit in 3-4 months post-surgery. - Coordinate with cardiologist and surgeon for post-surgery  care.     Randie Bustle, MD

## 2023-06-18 ENCOUNTER — Other Ambulatory Visit: Payer: Self-pay | Admitting: *Deleted

## 2023-06-18 DIAGNOSIS — I82509 Chronic embolism and thrombosis of unspecified deep veins of unspecified lower extremity: Secondary | ICD-10-CM

## 2023-06-18 MED ORDER — WARFARIN SODIUM 4 MG PO TABS: 2.0000 mg | ORAL_TABLET | ORAL | 1 refills | Status: DC

## 2023-06-20 DIAGNOSIS — I2699 Other pulmonary embolism without acute cor pulmonale: Secondary | ICD-10-CM | POA: Diagnosis not present

## 2023-06-20 DIAGNOSIS — D6859 Other primary thrombophilia: Secondary | ICD-10-CM | POA: Diagnosis not present

## 2023-06-20 DIAGNOSIS — Z7901 Long term (current) use of anticoagulants: Secondary | ICD-10-CM | POA: Diagnosis not present

## 2023-06-22 ENCOUNTER — Encounter: Payer: Self-pay | Admitting: Family Medicine

## 2023-06-22 ENCOUNTER — Other Ambulatory Visit: Payer: Self-pay | Admitting: Emergency Medicine

## 2023-06-22 ENCOUNTER — Other Ambulatory Visit (INDEPENDENT_AMBULATORY_CARE_PROVIDER_SITE_OTHER)

## 2023-06-22 DIAGNOSIS — E538 Deficiency of other specified B group vitamins: Secondary | ICD-10-CM

## 2023-06-22 DIAGNOSIS — E1169 Type 2 diabetes mellitus with other specified complication: Secondary | ICD-10-CM | POA: Diagnosis not present

## 2023-06-22 DIAGNOSIS — E039 Hypothyroidism, unspecified: Secondary | ICD-10-CM

## 2023-06-22 DIAGNOSIS — E785 Hyperlipidemia, unspecified: Secondary | ICD-10-CM

## 2023-06-22 DIAGNOSIS — E559 Vitamin D deficiency, unspecified: Secondary | ICD-10-CM | POA: Diagnosis not present

## 2023-06-22 LAB — HEMOGLOBIN A1C: Hgb A1c MFr Bld: 6.8 % — ABNORMAL HIGH (ref 4.6–6.5)

## 2023-06-22 LAB — CBC WITH DIFFERENTIAL/PLATELET
Basophils Absolute: 0 10*3/uL (ref 0.0–0.1)
Basophils Relative: 0.2 % (ref 0.0–3.0)
Eosinophils Absolute: 0.1 10*3/uL (ref 0.0–0.7)
Eosinophils Relative: 2 % (ref 0.0–5.0)
HCT: 47.4 % (ref 39.0–52.0)
Hemoglobin: 15.5 g/dL (ref 13.0–17.0)
Lymphocytes Relative: 25.9 % (ref 12.0–46.0)
Lymphs Abs: 1.9 10*3/uL (ref 0.7–4.0)
MCHC: 32.7 g/dL (ref 30.0–36.0)
MCV: 89.7 fl (ref 78.0–100.0)
Monocytes Absolute: 0.6 10*3/uL (ref 0.1–1.0)
Monocytes Relative: 8.1 % (ref 3.0–12.0)
Neutro Abs: 4.6 10*3/uL (ref 1.4–7.7)
Neutrophils Relative %: 63.8 % (ref 43.0–77.0)
Platelets: 224 10*3/uL (ref 150.0–400.0)
RBC: 5.29 Mil/uL (ref 4.22–5.81)
RDW: 14.3 % (ref 11.5–15.5)
WBC: 7.2 10*3/uL (ref 4.0–10.5)

## 2023-06-22 LAB — COMPREHENSIVE METABOLIC PANEL WITH GFR
ALT: 28 U/L (ref 0–53)
AST: 27 U/L (ref 0–37)
Albumin: 4.5 g/dL (ref 3.5–5.2)
Alkaline Phosphatase: 59 U/L (ref 39–117)
BUN: 17 mg/dL (ref 6–23)
CO2: 26 meq/L (ref 19–32)
Calcium: 8.9 mg/dL (ref 8.4–10.5)
Chloride: 104 meq/L (ref 96–112)
Creatinine, Ser: 1.25 mg/dL (ref 0.40–1.50)
GFR: 55.03 mL/min — ABNORMAL LOW (ref >60.00)
Glucose, Bld: 126 mg/dL — ABNORMAL HIGH (ref 70–99)
Potassium: 4.3 meq/L (ref 3.5–5.1)
Sodium: 140 meq/L (ref 135–145)
Total Bilirubin: 0.6 mg/dL (ref 0.2–1.2)
Total Protein: 7.4 g/dL (ref 6.0–8.3)

## 2023-06-22 LAB — LIPID PANEL
Cholesterol: 77 mg/dL (ref 0–200)
HDL: 31.6 mg/dL — ABNORMAL LOW (ref >39.00)
LDL Cholesterol: 30 mg/dL (ref 0–99)
NonHDL: 45.19
Total CHOL/HDL Ratio: 2
Triglycerides: 77 mg/dL (ref 0.0–149.0)
VLDL: 15.4 mg/dL (ref 0.0–40.0)

## 2023-06-22 LAB — T4, FREE: Free T4: 0.52 ng/dL — ABNORMAL LOW (ref 0.60–1.60)

## 2023-06-22 LAB — VITAMIN B12: Vitamin B-12: 1493 pg/mL — ABNORMAL HIGH (ref 211–911)

## 2023-06-22 LAB — VITAMIN D 25 HYDROXY (VIT D DEFICIENCY, FRACTURES): VITD: 75.1 ng/mL (ref 30.00–100.00)

## 2023-06-22 LAB — TSH: TSH: 6.08 u[IU]/mL — ABNORMAL HIGH (ref 0.35–5.50)

## 2023-06-22 NOTE — Telephone Encounter (Unsigned)
 Copied from CRM [id number]. Topic: Clinical - Medical Advice >> Jun 19, 2023  8:54 AM Lovett Ruck C wrote: Reason for CRM: Reason for CRM: patient was given instructions to go to lab that was closed today 4/18. He was wondering if there was somewhere else he could go or another location and I let the patient know that his primary care office was also closed today because it is Good Friday. That may be why his lab was also closed but if he could receive a follow up call on Monday he may appreciate it. Thank you.

## 2023-06-23 ENCOUNTER — Other Ambulatory Visit: Payer: Self-pay | Admitting: *Deleted

## 2023-06-23 ENCOUNTER — Encounter: Payer: Self-pay | Admitting: Surgery

## 2023-06-23 ENCOUNTER — Institutional Professional Consult (permissible substitution): Admitting: Surgery

## 2023-06-23 VITALS — BP 113/73 | HR 83 | Resp 20 | Ht 71.0 in | Wt 171.0 lb

## 2023-06-23 DIAGNOSIS — I2510 Atherosclerotic heart disease of native coronary artery without angina pectoris: Secondary | ICD-10-CM | POA: Insufficient documentation

## 2023-06-23 DIAGNOSIS — I25119 Atherosclerotic heart disease of native coronary artery with unspecified angina pectoris: Secondary | ICD-10-CM

## 2023-06-23 NOTE — Progress Notes (Signed)
 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  He has a brother who had a heart attack and 2 stents placed in [place].  The patient had an episode of substernal chest discomfort and called his brother to see what his chest pain symptoms were like and they matched his so he called Dr. Renna Cary.  He had a PET stress test performed on 05/27/2023 which showed flow reduction in the anterior wall distribution consistent with ischemia.  Left ventricular ejection fraction is 48%.  He subsequently underwent coronary angiography showing a 70 to 80% proximal LAD stenosis and 95% stenosis at a large first diagonal branch.  There is about 20% in a marginal branch and the RCA had a long area of 80% proximal to mid vessel stenosis.  The patient is here today with his wife and daughter.  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  He volunteers at [facility].  His wife had cancer and was followed by oncology there. 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   Collagen vascular disease ([name])    Cough 12/01/2016   DVT (deep venous thrombosis) ([name])    takes coumadin    Dysuria 02/27/2013   Erectile dysfunction 12/28/2016   Hereditary protein S deficiency ([name]) 05/16/2011   Hyperglycemia 12/26/2014   Hyperlipidemia    Hypothyroid 06/10/2011   Insomnia 04/24/2016   Left hip pain 12/17/2010   MCI (mild cognitive impairment) 07/22/2016   Nasal  lesion 07/22/2016   Polymyalgia ([name]) 03/21/2014   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   Preventative health care 12/17/2010   Shingles 1982   right abdominal wall   Staph skin infection 07/22/2016   Thyroid  nodule 11/29/2014   Urinary hesitancy 09/30/2016   Vitamin D  deficiency 10/14/2011    Past Surgical History:  Procedure Laterality Date   LEFT HEART CATH AND CORONARY ANGIOGRAPHY N/A 06/11/2023   Procedure: LEFT HEART CATH AND CORONARY ANGIOGRAPHY;  Surgeon: Thukkani, Arun K, MD;  Location: [facility] INVASIVE CV LAB;  Service: Cardiovascular;  Laterality: N/A;   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   4 surgeries    Family History  Problem Relation Age of Onset   Cancer Mother        unknown- everywhere   Cancer Sister        breast    Social History Social History   Tobacco Use   Smoking status: Former    Current packs/day: 0.00    Average packs/day: 1 pack/day for 20.0 years (20.0 ttl pk-yrs)    Types: Cigarettes    Start date: 03/03/1960    Quit date: 03/03/1980    Years since quitting: 43.3   Smokeless tobacco: Never  Substance Use Topics   Alcohol use: No    Alcohol/week: 0.0 standard drinks of alcohol   Drug use: No    Current Outpatient Medications  Medication Sig Dispense Refill   ACCU-CHEK GUIDE TEST test strip  USE TO CHECK BLOOD SUGAR ONCE DAILY . E11.9 100 strip 1   Accu-Chek Softclix Lancets lancets USE AS DIRECTED TO CHECK SUGAR ONCE DAILY.E11.9 100 each 1   Blood Glucose Monitoring Suppl (ONETOUCH VERIO FLEX SYSTEM) w/Device KIT Use to check blood sugar once a day. DX E11.9 1 kit 0   cetirizine  (ZYRTEC ) 10 MG tablet Take 10 mg by mouth daily as needed for allergies.     Cholecalciferol 4000 UNITS TABS Take 1 tablet by mouth daily.     Coenzyme Q10 (COQ10) 100 MG CAPS Take 100 mg by mouth daily.     DULoxetine  (CYMBALTA ) 30 MG capsule Take 30 mg by mouth at bedtime.     DULoxetine  (CYMBALTA ) 60 MG capsule Take 60 mg by mouth daily.     Evolocumab   (REPATHA  SURECLICK) 140 MG/ML SOAJ Inject 140 mg into the skin every 14 (fourteen) days. 6 mL 3   pantoprazole  (PROTONIX ) 40 MG tablet TAKE 1 TABLET BY MOUTH EVERY DAY 90 tablet 1   rosuvastatin  (CRESTOR ) 10 MG tablet TAKE 1 TABLET BY MOUTH EVERY DAY 90 tablet 3   thyroid  (ARMOUR THYROID ) 60 MG tablet TAKE ONE TABLET BY MOUTH ON MONDAY, WEDNESDAY, THURSDAY, FRIDAY, AND SUNDAY. (Patient taking differently: Take 60 mg by mouth daily before breakfast.) 65 tablet 1   warfarin (COUMADIN ) 4 MG tablet Take 0.5-1 tablets (2-4 mg total) by mouth See admin instructions. 4 mg on Sun Mon Wed Fri and 2 mg Tues Thurs and Sat 90 tablet 1   zinc gluconate 50 MG tablet Take 50 mg by mouth daily.     enoxaparin  (LOVENOX ) 120 MG/0.8ML injection Inject 0.8 mLs (120 mg total) into the skin daily. Inject on 4/09 and on 4/11, 4/12 and 4/13 3.2 mL 0   No current facility-administered medications for this visit.    No Known Allergies  Review of Systems  Constitutional:  Positive for activity change and fatigue.  HENT: Negative.    Eyes: Negative.   Respiratory:  Negative for shortness of breath.   Cardiovascular:  Positive for chest pain. Negative for palpitations and leg swelling.  Gastrointestinal: Negative.   Endocrine: Negative.   Genitourinary: Negative.   Musculoskeletal: Negative.   Allergic/Immunologic: Negative.   Neurological:  Negative for dizziness and syncope.  Hematological: Negative.   Psychiatric/Behavioral: Negative.      BP 113/73   Pulse 83   Resp 20   Ht 5\' 11"  (1.803 m)   Wt 171 lb (77.6 kg)   SpO2 97% Comment: RA  BMI 23.85 kg/m  Physical Exam Constitutional:      Appearance: Normal appearance. He is normal weight.  HENT:     Head: Normocephalic and atraumatic.  Eyes:     Extraocular Movements: Extraocular movements intact.     Conjunctiva/sclera: Conjunctivae normal.     Pupils: Pupils are equal, round, and reactive to light.  Cardiovascular:     Rate and Rhythm: Normal  rate and regular rhythm.     Pulses: Normal pulses.     Heart sounds: Normal heart sounds. No murmur heard. Pulmonary:     Effort: Pulmonary effort is normal.     Breath sounds: Normal breath sounds.  Abdominal:     General: Abdomen is flat. There is no distension.     Palpations: Abdomen is soft.     Tenderness: There is no abdominal tenderness.  Musculoskeletal:        General: No swelling.     Cervical back: Normal range of motion  and neck supple.  Skin:    General: Skin is warm and dry.  Neurological:     General: No focal deficit present.     Mental Status: He is alert and oriented to person, place, and time.  Psychiatric:        Mood and Affect: Mood normal.        Behavior: Behavior normal.      Diagnostic Tests:  Physicians  Panel Physicians Referring Physician Case Authorizing Physician  Thukkani, Arun K, MD (Primary)     Procedures  LEFT HEART CATH AND CORONARY ANGIOGRAPHY   Conclusion      1st Diag lesion is 95% stenosed.   Prox LAD lesion is 70% stenosed.   3rd Mrg lesion is 20% stenosed.   Prox RCA lesion is 80% stenosed.   1.  Severe multivessel disease consisting of proximal LAD, first diagonal, and proximal right coronary artery lesions. 2.  LVEDP of 16 mmHg.   Recommendation: Cardiothoracic surgical consultation.  Results reviewed with patient's wife.   Indications  Abnormal stress test [R94.39 (ICD-10-CM)]   Procedural Details  Technical Details The patient is a 78 year old male with a history of hyperlipidemia and a protein S deficiency on chronic warfarin therapy who is seen in the outpatient setting due to exertional angina.  He is referred for stress test which demonstrated intermediate risk findings.  He is referred for coronary angiography for further evaluation of an abnormal stress test.  After obtaining consent, the patient brought to the cardiac catheterization laboratory and prepped draped in sterile fashion.  Xylocaine  is used to  anesthetize the right wrist and a 6 French Terumo glide sheath was placed.  5000 units heparin  and 5 mg verapamil  were administered through the sheath.  A 6 [place] TIG catheter was used for selective coronary angiography.  A 5 French pigtail catheter was used for left ventricular end-diastolic pressure assessment.  After review of the angiogram, no further interventions were pursued.  A TR band was placed.  There were no acute complications. Estimated blood loss <50 mL.   During this procedure medications were administered to achieve and maintain moderate conscious sedation while the patient's heart rate, blood pressure, and oxygen saturation were continuously monitored and I was present face-to-face 100% of this time.     Logan Boothe Rad Tech and [employer] are independent, trained observers who assisted in the monitoring of the patient's level of consciousness.   Medications (Filter: Administrations occurring from 1247 to 1326 on 06/11/23) midazolam  (VERSED ) injection (mg)  Total dose: 1 mg Date/Time Rate/Dose/Volume Action   06/11/23 1301 1 mg Given   fentaNYL  (SUBLIMAZE ) injection (mcg)  Total dose: 25 mcg Date/Time Rate/Dose/Volume Action   06/11/23 1301 25 mcg Given   lidocaine  (PF) (XYLOCAINE ) 1 % injection (mL)  Total volume: 2 mL Date/Time Rate/Dose/Volume Action   06/11/23 1305 2 mL Given   Radial Cocktail/Verapamil  only (mL)  Total volume: 10 mL Date/Time Rate/Dose/Volume Action   06/11/23 1306 10 mL Given   heparin  sodium (porcine) injection (Units)  Total dose: 5,000 Units Date/Time Rate/Dose/Volume Action   06/11/23 1307 5,000 Units Given   Heparin  (Porcine) in NaCl 1000-0.9 UT/500ML-% SOLN (mL)  Total volume: 1,000 mL Date/Time Rate/Dose/Volume Action   06/11/23 1307 1,000 mL Given   iohexol  (OMNIPAQUE ) 350 MG/ML injection (mL)  Total volume: 30 mL Date/Time Rate/Dose/Volume Action   06/11/23 1320 30 mL Given    Sedation Time  Sedation Time  Physician-1: 15 minutes 21 seconds Contrast  Administrations occurring from 1247 to 1326 on 06/11/23:  Medication Name Total Dose  iohexol  (OMNIPAQUE ) 350 MG/ML injection 30 mL   Radiation/Fluoro  Fluoro time: 2.4 (min) DAP: 19983 (mGycm2) Cumulative Air Kerma: 254 (mGy) Complications  Complications documented before study signed (06/11/2023  1:35 PM)   No complications were associated with this study.  Documented by Ned Balint, RT - 06/11/2023  1:25 PM     Coronary Findings  Diagnostic Dominance: Right Left Anterior Descending  Prox LAD lesion is 70% stenosed.    First Diagonal Branch  1st Diag lesion is 95% stenosed.    Left Circumflex    Third Obtuse Marginal Branch  3rd Mrg lesion is 20% stenosed.    Right Coronary Artery  Prox RCA lesion is 80% stenosed.    Intervention   No interventions have been documented.   Coronary Diagrams  Diagnostic Dominance: Right  Intervention   Implants   No implant documentation for this case.   Syngo Images   Show images for CARDIAC CATHETERIZATION Images on Long Term Storage   Show images for Nichlas, Pitera to Procedure Log  Procedure Log    Hemo Data  Flowsheet Row Most Recent Value  Aortic Mean Gradient 4.62 mmHg  Aortic Peak Gradient 2.3 mmHg  LV Systolic Pressure 117 mmHg  LV Diastolic Pressure 2 mmHg  LV EDP 16 mmHg  Arterial Occlusion Pressure Extended Systolic Pressure 114 mmHg  Arterial Occlusion Pressure Extended Diastolic Pressure 68 mmHg  Arterial Occlusion Pressure Extended Mean Pressure 87 mmHg  Left Ventricular Apex Extended Systolic Pressure 114 mmHg  LVp Diastolic Pressure 2 mmHg  Left Ventricular Apex Extended EDP Pressure 15 mmHg   ECHOCARDIOGRAM REPORT       Patient Name:   Paul Ryan  Date of Exam: 06/11/2023  Medical Rec #:  [id number]  Height:       71.0 in  Accession #:    [id number] Weight:       171.0 lb  Date of Birth:  03-15-1945  BSA:          1.973 m  Patient  Age:    77 years   BP:           99/67 mmHg  Patient Gender: M          HR:           51 bpm.  Exam Location:  Inpatient   Procedure: 2D Echo, Cardiac Doppler and Color Doppler (Both Spectral and  Color            Flow Doppler were utilized during procedure).   Indications:    Z01.818 Encounter for other preprocedural examination.;  I25.110                 Atherosclerotic heart disease of native coronary artery  with                 unstable angina pectoris    History:        Patient has prior history of Echocardiogram examinations,  most                 recent 03/01/2018. CAD, Signs/Symptoms:Dyspnea and  Shortness of                  Breath; Risk Factors:Hypertension and Diabetes. Pulmonary                  embolus.    Sonographer:    Raynelle Callow RDCS  Referring Phys: [id number] ARUN K [facility]     Sonographer Comments: Suboptimal subcostal window. Patient post op with  arm across chest.  IMPRESSIONS     1. Left ventricular ejection fraction, by estimation, is 55 to 60%. The  left ventricle has normal function. The left ventricle has no regional  wall motion abnormalities. Left ventricular diastolic parameters are  indeterminate.   2. Right ventricular systolic function is normal. The right ventricular  size is normal. Tricuspid regurgitation signal is inadequate for assessing  PA pressure.   3. The mitral valve is degenerative. Trivial mitral valve regurgitation.  No evidence of mitral stenosis.   4. The aortic valve is tricuspid. Aortic valve regurgitation is trivial.  Aortic valve sclerosis is present, with no evidence of aortic valve  stenosis.   5. Aortic dilatation noted. There is dilatation of the aortic root,  measuring 40 mm.   FINDINGS   Left Ventricle: Left ventricular ejection fraction, by estimation, is 55  to 60%. The left ventricle has normal function. The left ventricle has no  regional wall motion abnormalities. The left ventricular internal cavity  size  was normal in size. There is   no left ventricular hypertrophy. Left ventricular diastolic parameters  are indeterminate.   Right Ventricle: The right ventricular size is normal. No increase in  right ventricular wall thickness. Right ventricular systolic function is  normal. Tricuspid regurgitation signal is inadequate for assessing PA  pressure.   Left Atrium: Left atrial size was normal in size.   Right Atrium: Right atrial size was normal in size.   Pericardium: There is no evidence of pericardial effusion.   Mitral Valve: The mitral valve is degenerative in appearance. Mild to  moderate mitral annular calcification. Trivial mitral valve regurgitation.  No evidence of mitral valve stenosis.   Tricuspid Valve: The tricuspid valve is normal in structure. Tricuspid  valve regurgitation is not demonstrated. No evidence of tricuspid  stenosis.   Aortic Valve: The aortic valve is tricuspid. Aortic valve regurgitation is  trivial. Aortic valve sclerosis is present, with no evidence of aortic  valve stenosis.   Pulmonic Valve: The pulmonic valve was normal in structure. Pulmonic valve  regurgitation is not visualized. No evidence of pulmonic stenosis.   Aorta: Aortic dilatation noted. There is dilatation of the aortic root,  measuring 40 mm.   IAS/Shunts: No atrial level shunt detected by color flow Doppler.     LEFT VENTRICLE  PLAX 2D  LVIDd:         4.05 cm     Diastology  LVIDs:         2.60 cm     LV e' medial:    7.07 cm/s  LV PW:         0.95 cm     LV E/e' medial:  10.9  LV IVS:        0.90 cm     LV e' lateral:   11.00 cm/s  LVOT diam:     2.20 cm     LV E/e' lateral: 7.0  LV SV:         77  LV SV Index:   39  LVOT Area:     3.80 cm    LV Volumes (MOD)  LV vol d, MOD A2C: 68.6 ml  LV vol d, MOD A4C: 95.6 ml  LV vol s, MOD A2C: 25.7 ml  LV vol s, MOD A4C: 35.2 ml  LV SV MOD A2C:  42.9 ml  LV SV MOD A4C:     95.6 ml  LV SV MOD BP:      48.7 ml   RIGHT  VENTRICLE             IVC  RV S prime:     11.20 cm/s  IVC diam: 1.10 cm  TAPSE (M-mode): 2.2 cm   LEFT ATRIUM             Index        RIGHT ATRIUM           Index  LA diam:        3.10 cm 1.57 cm/m   RA Area:     10.30 cm  LA Vol (A2C):   42.4 ml 21.49 ml/m  RA Volume:   19.70 ml  9.99 ml/m  LA Vol (A4C):   17.6 ml 8.92 ml/m  LA Biplane Vol: 28.4 ml 14.40 ml/m   AORTIC VALVE  LVOT Vmax:   94.00 cm/s  LVOT Vmean:  59.000 cm/s  LVOT VTI:    0.203 m    AORTA  Ao Root diam: 4.00 cm  Ao Asc diam:  3.80 cm   MITRAL VALVE  MV Area (PHT): 3.37 cm    SHUNTS  MV Decel Time: 225 msec    Systemic VTI:  0.20 m  MV E velocity: 77.00 cm/s  Systemic Diam: 2.20 cm  MV A velocity: 77.00 cm/s  MV E/A ratio:  1.00   Kardie Tobb DO  Electronically signed by Jerryl Morin DO  Signature Date/Time: 06/11/2023/4:42:10 PM        Final      Impression:  This 78 year old gentleman has severe multivessel coronary disease involving the proximal LAD, first diagonal, and right coronary arteries.  I agree that coronary artery bypass graft surgery is the best treatment to prevent ischemia and infarction and improve his quality of life. I discussed the operative procedure with the patient, wife and daughter including alternatives, benefits and risks; including but not limited to bleeding, blood transfusion, infection, stroke, myocardial infarction, graft failure, heart block requiring a permanent pacemaker, organ dysfunction, and death.  Paul Ryan understands and agrees to proceed.     Plan:  We will schedule CABG for Friday 07/03/2023.  He will need to stop warfarin 5 days preoperatively and will need a Lovenox  bridge with his history of protein S deficiency with DVT and PE.  Will discuss this with Dr. Scherrie Curt.  I spent 60 minutes performing this consultation and > 50% of this time was spent face to face counseling and coordinating the care of this patient's severe multivessel coronary  disease.   Bartley Lightning, MD [facility] [phone]

## 2023-06-24 ENCOUNTER — Other Ambulatory Visit: Payer: Self-pay | Admitting: *Deleted

## 2023-06-24 ENCOUNTER — Telehealth: Payer: Self-pay | Admitting: Emergency Medicine

## 2023-06-24 ENCOUNTER — Telehealth: Payer: Self-pay | Admitting: *Deleted

## 2023-06-24 ENCOUNTER — Encounter: Payer: Self-pay | Admitting: *Deleted

## 2023-06-24 DIAGNOSIS — E039 Hypothyroidism, unspecified: Secondary | ICD-10-CM

## 2023-06-24 MED ORDER — THYROID 60 MG PO TABS: ORAL_TABLET | ORAL | 0 refills | Status: DC

## 2023-06-24 NOTE — Progress Notes (Signed)
 Tablets are unable to be split so he will take 2 tablets on day a week and 1 tablet the rest of the week.  He will get labs done at [facility] in 3 months.  Lab order placed.

## 2023-06-24 NOTE — Telephone Encounter (Signed)
 Copied from CRM [id number]. Topic: Clinical - Medication Question >> Jun 24, 2023  1:44 PM Earnestine Goes B wrote: Reason for CRM: Pt called to speak with CMA Shekita. Pt is requesting a call back regarding labs. Please call pt at [phone]

## 2023-06-24 NOTE — Telephone Encounter (Signed)
 Pt notified of changes with thyroid  medication and new dose sent into pharmacy.

## 2023-06-24 NOTE — Telephone Encounter (Signed)
 Patient just wanted to let you know that he is having triple bypass surgery on 07/03/23

## 2023-06-25 ENCOUNTER — Encounter: Payer: Self-pay | Admitting: Oncology

## 2023-06-30 ENCOUNTER — Telehealth: Payer: Self-pay | Admitting: *Deleted

## 2023-06-30 MED ORDER — ENOXAPARIN SODIUM 80 MG/0.8ML IJ SOSY: 80.0000 mg | PREFILLED_SYRINGE | Freq: Two times a day (BID) | INTRAMUSCULAR | 0 refills | Status: DC

## 2023-06-30 NOTE — Pre-Procedure Instructions (Signed)
 Surgical Instructions   Your procedure is scheduled on Jul 03, 2023. Report to [facility] Main Entrance "A" at 5:30 A.M., then check in with the Admitting office. Any questions or running late day of surgery: call [phone]  Questions prior to your surgery date: call [phone], Monday-Friday, 8am-4pm. If you experience any cold or flu symptoms such as cough, fever, chills, shortness of breath, etc. between now and your scheduled surgery, please notify us  at the above number.     Remember:  Do not eat or drink after midnight the night before your surgery    Take these medicines the morning of surgery with A SIP OF WATER: DULoxetine (CYMBALTA)  pantoprazole (PROTONIX)  thyroid  (ARMOUR THYROID )    May take these medicines IF NEEDED: predniSONE  (DELTASONE )    STOP taking your warfarin (COUMADIN ) five days prior to surgery. Your last dose will be April 26th. Please follow Lovenox  injection instructions per Dr. Enedina Harrow office.   One week prior to surgery, STOP taking any Aspirin  (unless otherwise instructed by your surgeon) Aleve, Naproxen, Ibuprofen, Motrin, Advil, Goody's, BC's, all herbal medications, fish oil, and non-prescription vitamins.                     Do NOT Smoke (Tobacco/Vaping) for 24 hours prior to your procedure.  If you use a CPAP at night, you may bring your mask/headgear for your overnight stay.   You will be asked to remove any contacts, glasses, piercing's, hearing aid's, dentures/partials prior to surgery. Please bring cases for these items if needed.    Patients discharged the day of surgery will not be allowed to drive home, and someone needs to stay with them for 24 hours.  SURGICAL WAITING ROOM VISITATION Patients may have no more than 2 support people in the waiting area - these visitors may rotate.   Pre-op nurse will coordinate an appropriate time for 1 ADULT support person, who may not rotate, to accompany patient in pre-op.  Children under  the age of 63 must have an adult with them who is not the patient and must remain in the main waiting area with an adult.  If the patient needs to stay at the hospital during part of their recovery, the visitor guidelines for inpatient rooms apply.  Please refer to the [facility] website for the visitor guidelines for any additional information.   If you received a COVID test during your pre-op visit  it is requested that you wear a mask when out in public, stay away from anyone that may not be feeling well and notify your surgeon if you develop symptoms. If you have been in contact with anyone that has tested positive in the last 10 days please notify you surgeon.      Pre-operative CHG Bathing Instructions   You can play a key role in reducing the risk of infection after surgery. Your skin needs to be as free of germs as possible. You can reduce the number of germs on your skin by washing with CHG (chlorhexidine gluconate) soap before surgery. CHG is an antiseptic soap that kills germs and continues to kill germs even after washing.   DO NOT use if you have an allergy to chlorhexidine/CHG or antibacterial soaps. If your skin becomes reddened or irritated, stop using the CHG and notify one of our RNs at [phone].              TAKE A SHOWER THE NIGHT BEFORE SURGERY AND THE DAY  OF SURGERY    Please keep in mind the following:  DO NOT shave, including legs and underarms, 48 hours prior to surgery.   You may shave your face before/day of surgery.  Place clean sheets on your bed the night before surgery Use a clean washcloth (not used since being washed) for each shower. DO NOT sleep with pet's night before surgery.  CHG Shower Instructions:  Wash your face and private area with normal soap. If you choose to wash your hair, wash first with your normal shampoo.  After you use shampoo/soap, rinse your hair and body thoroughly to remove shampoo/soap residue.  Turn the water OFF and apply  half the bottle of CHG soap to a CLEAN washcloth.  Apply CHG soap ONLY FROM YOUR NECK DOWN TO YOUR TOES (washing for 3-5 minutes)  DO NOT use CHG soap on face, private areas, open wounds, or sores.  Pay special attention to the area where your surgery is being performed.  If you are having back surgery, having someone wash your back for you may be helpful. Wait 2 minutes after CHG soap is applied, then you may rinse off the CHG soap.  Pat dry with a clean towel  Put on clean pajamas    Additional instructions for the day of surgery: DO NOT APPLY any lotions, deodorants, cologne, or perfumes.   Do not wear jewelry or makeup Do not wear nail polish, gel polish, artificial nails, or any other type of covering on natural nails (fingers and toes) Do not bring valuables to the hospital. [facility] is not responsible for valuables/personal belongings. Put on clean/comfortable clothes.  Please brush your teeth.  Ask your nurse before applying any prescription medications to the skin.

## 2023-06-30 NOTE — Telephone Encounter (Signed)
 LVM for [name] that script for enoxaparin  sent to [facility] [employer] on [address] to start tomorrow x 3 doses.

## 2023-07-01 ENCOUNTER — Ambulatory Visit (HOSPITAL_BASED_OUTPATIENT_CLINIC_OR_DEPARTMENT_OTHER)
Admission: RE | Admit: 2023-07-01 | Discharge: 2023-07-01 | Disposition: A | Discharge status: Home / Self Care | Source: Ambulatory Visit | Attending: Surgery | Admitting: Surgery

## 2023-07-01 ENCOUNTER — Other Ambulatory Visit: Payer: Self-pay

## 2023-07-01 ENCOUNTER — Other Ambulatory Visit (HOSPITAL_COMMUNITY)

## 2023-07-01 ENCOUNTER — Inpatient Hospital Stay: Payer: Medicare PPO | Admitting: Oncology

## 2023-07-01 ENCOUNTER — Encounter (HOSPITAL_COMMUNITY)
Admission: RE | Admit: 2023-07-01 | Discharge: 2023-07-01 | Disposition: A | Discharge status: Home / Self Care | Source: Ambulatory Visit | Attending: Surgery | Admitting: Surgery

## 2023-07-01 ENCOUNTER — Encounter (HOSPITAL_COMMUNITY): Payer: Self-pay

## 2023-07-01 ENCOUNTER — Ambulatory Visit (HOSPITAL_COMMUNITY)
Admission: RE | Admit: 2023-07-01 | Discharge: 2023-07-01 | Disposition: A | Discharge status: Home / Self Care | Source: Ambulatory Visit | Attending: Surgery | Admitting: Surgery

## 2023-07-01 VITALS — BP 105/69 | HR 68 | Temp 98.1°F | Resp 18 | Ht 71.0 in | Wt 165.7 lb

## 2023-07-01 VITALS — BP 118/77 | HR 60 | Temp 97.7°F | Resp 18 | Ht 71.0 in | Wt 166.3 lb

## 2023-07-01 DIAGNOSIS — G4730 Sleep apnea, unspecified: Secondary | ICD-10-CM | POA: Diagnosis not present

## 2023-07-01 DIAGNOSIS — Z8615 Personal history of latent tuberculosis infection: Secondary | ICD-10-CM | POA: Insufficient documentation

## 2023-07-01 DIAGNOSIS — E1169 Type 2 diabetes mellitus with other specified complication: Secondary | ICD-10-CM | POA: Diagnosis present

## 2023-07-01 DIAGNOSIS — Z01818 Encounter for other preprocedural examination: Secondary | ICD-10-CM | POA: Insufficient documentation

## 2023-07-01 DIAGNOSIS — Z79890 Hormone replacement therapy (postmenopausal): Secondary | ICD-10-CM | POA: Diagnosis not present

## 2023-07-01 DIAGNOSIS — R110 Nausea: Secondary | ICD-10-CM | POA: Diagnosis not present

## 2023-07-01 DIAGNOSIS — I2510 Atherosclerotic heart disease of native coronary artery without angina pectoris: Secondary | ICD-10-CM | POA: Diagnosis present

## 2023-07-01 DIAGNOSIS — I82509 Chronic embolism and thrombosis of unspecified deep veins of unspecified lower extremity: Secondary | ICD-10-CM | POA: Diagnosis not present

## 2023-07-01 DIAGNOSIS — Z4682 Encounter for fitting and adjustment of non-vascular catheter: Secondary | ICD-10-CM | POA: Diagnosis not present

## 2023-07-01 DIAGNOSIS — I10 Essential (primary) hypertension: Secondary | ICD-10-CM | POA: Diagnosis not present

## 2023-07-01 DIAGNOSIS — Z7901 Long term (current) use of anticoagulants: Secondary | ICD-10-CM | POA: Insufficient documentation

## 2023-07-01 DIAGNOSIS — R918 Other nonspecific abnormal finding of lung field: Secondary | ICD-10-CM | POA: Diagnosis not present

## 2023-07-01 DIAGNOSIS — M353 Polymyalgia rheumatica: Secondary | ICD-10-CM | POA: Diagnosis present

## 2023-07-01 DIAGNOSIS — I700 Atherosclerosis of aorta: Secondary | ICD-10-CM | POA: Diagnosis not present

## 2023-07-01 DIAGNOSIS — D6859 Other primary thrombophilia: Secondary | ICD-10-CM | POA: Diagnosis present

## 2023-07-01 DIAGNOSIS — E039 Hypothyroidism, unspecified: Secondary | ICD-10-CM | POA: Insufficient documentation

## 2023-07-01 DIAGNOSIS — E876 Hypokalemia: Secondary | ICD-10-CM | POA: Diagnosis present

## 2023-07-01 DIAGNOSIS — Z951 Presence of aortocoronary bypass graft: Secondary | ICD-10-CM | POA: Diagnosis not present

## 2023-07-01 DIAGNOSIS — Z87891 Personal history of nicotine dependence: Secondary | ICD-10-CM | POA: Diagnosis not present

## 2023-07-01 DIAGNOSIS — J90 Pleural effusion, not elsewhere classified: Secondary | ICD-10-CM | POA: Diagnosis not present

## 2023-07-01 DIAGNOSIS — Z86711 Personal history of pulmonary embolism: Secondary | ICD-10-CM | POA: Diagnosis not present

## 2023-07-01 DIAGNOSIS — N400 Enlarged prostate without lower urinary tract symptoms: Secondary | ICD-10-CM | POA: Diagnosis present

## 2023-07-01 DIAGNOSIS — E1151 Type 2 diabetes mellitus with diabetic peripheral angiopathy without gangrene: Secondary | ICD-10-CM | POA: Diagnosis present

## 2023-07-01 DIAGNOSIS — Z86718 Personal history of other venous thrombosis and embolism: Secondary | ICD-10-CM | POA: Insufficient documentation

## 2023-07-01 DIAGNOSIS — Z79899 Other long term (current) drug therapy: Secondary | ICD-10-CM | POA: Diagnosis not present

## 2023-07-01 DIAGNOSIS — J9811 Atelectasis: Secondary | ICD-10-CM | POA: Diagnosis not present

## 2023-07-01 DIAGNOSIS — K219 Gastro-esophageal reflux disease without esophagitis: Secondary | ICD-10-CM | POA: Diagnosis present

## 2023-07-01 DIAGNOSIS — E119 Type 2 diabetes mellitus without complications: Secondary | ICD-10-CM | POA: Diagnosis not present

## 2023-07-01 DIAGNOSIS — Z48812 Encounter for surgical aftercare following surgery on the circulatory system: Secondary | ICD-10-CM | POA: Diagnosis not present

## 2023-07-01 DIAGNOSIS — J939 Pneumothorax, unspecified: Secondary | ICD-10-CM | POA: Diagnosis not present

## 2023-07-01 DIAGNOSIS — Z8249 Family history of ischemic heart disease and other diseases of the circulatory system: Secondary | ICD-10-CM | POA: Diagnosis not present

## 2023-07-01 DIAGNOSIS — I440 Atrioventricular block, first degree: Secondary | ICD-10-CM | POA: Diagnosis present

## 2023-07-01 DIAGNOSIS — E785 Hyperlipidemia, unspecified: Secondary | ICD-10-CM | POA: Diagnosis present

## 2023-07-01 HISTORY — DX: Peripheral vascular disease, unspecified: I73.9

## 2023-07-01 HISTORY — DX: Atherosclerotic heart disease of native coronary artery without angina pectoris: I25.10

## 2023-07-01 HISTORY — DX: Sleep apnea, unspecified: G47.30

## 2023-07-01 HISTORY — DX: Type 2 diabetes mellitus without complications: E11.9

## 2023-07-01 LAB — URINALYSIS, ROUTINE W REFLEX MICROSCOPIC
Bilirubin Urine: NEGATIVE
Glucose, UA: 50 mg/dL — AB
Hgb urine dipstick: NEGATIVE
Ketones, ur: NEGATIVE mg/dL
Leukocytes,Ua: NEGATIVE
Nitrite: NEGATIVE
Protein, ur: NEGATIVE mg/dL
Specific Gravity, Urine: 1.02 (ref 1.005–1.030)
pH: 5 (ref 5.0–8.0)

## 2023-07-01 LAB — COMPREHENSIVE METABOLIC PANEL WITH GFR
ALT: 28 U/L (ref 0–44)
AST: 30 U/L (ref 15–41)
Albumin: 3.9 g/dL (ref 3.5–5.0)
Alkaline Phosphatase: 53 U/L (ref 38–126)
Anion gap: 7 (ref 5–15)
BUN: 20 mg/dL (ref 8–23)
CO2: 25 mmol/L (ref 22–32)
Calcium: 9.4 mg/dL (ref 8.9–10.3)
Chloride: 107 mmol/L (ref 98–111)
Creatinine, Ser: 1.22 mg/dL (ref 0.61–1.24)
GFR, Estimated: >60 mL/min (ref >60)
Glucose, Bld: 125 mg/dL — ABNORMAL HIGH (ref 70–99)
Potassium: 4.3 mmol/L (ref 3.5–5.1)
Sodium: 139 mmol/L (ref 135–145)
Total Bilirubin: 1 mg/dL (ref 0.0–1.2)
Total Protein: 7.4 g/dL (ref 6.5–8.1)

## 2023-07-01 LAB — CBC
HCT: 44.9 % (ref 39.0–52.0)
Hemoglobin: 14.3 g/dL (ref 13.0–17.0)
MCH: 28.7 pg (ref 26.0–34.0)
MCHC: 31.8 g/dL (ref 30.0–36.0)
MCV: 90.2 fL (ref 80.0–100.0)
Platelets: 245 10*3/uL (ref 150–400)
RBC: 4.98 MIL/uL (ref 4.22–5.81)
RDW: 13.6 % (ref 11.5–15.5)
WBC: 7.4 10*3/uL (ref 4.0–10.5)
nRBC: 0 % (ref 0.0–0.2)

## 2023-07-01 LAB — APTT: aPTT: 33 s (ref 24–36)

## 2023-07-01 LAB — VAS US DOPPLER PRE CABG
Left ABI: 1.11
Right ABI: 1.24

## 2023-07-01 LAB — PROTIME-INR
INR: 1.4 — ABNORMAL HIGH (ref 0.8–1.2)
Prothrombin Time: 17.7 s — ABNORMAL HIGH (ref 11.4–15.2)

## 2023-07-01 LAB — SURGICAL PCR SCREEN
MRSA, PCR: NEGATIVE
Staphylococcus aureus: NEGATIVE

## 2023-07-01 LAB — HEMOGLOBIN A1C
Hgb A1c MFr Bld: 6.3 % — ABNORMAL HIGH (ref 4.8–5.6)
Mean Plasma Glucose: 134.11 mg/dL

## 2023-07-01 LAB — GLUCOSE, CAPILLARY: Glucose-Capillary: 110 mg/dL — ABNORMAL HIGH (ref 70–99)

## 2023-07-01 NOTE — Progress Notes (Signed)
 [facility] OFFICE PROGRESS NOTE   Diagnosis: Protein S deficiency  INTERVAL HISTORY:   Mr. Hyder returns as scheduled.  He continues Coumadin  anticoagulation.  No symptom of recurrent thrombosis.  No bleeding.  He monitors the PT/INR at home.  The PT/INR is generally in the 2-3 range.  He checks the PT/INR every 3 weeks. He had a recent episode of chest pain.  He saw cardiology and underwent a cardiac catheterization procedure 06/11/2023.  He received bridging Lovenox  anticoagulation surrounding the cardiac catheterization.  He was noted to have severe multivessel disease.  A cardiac perfusion PET on 05/27/2023 revealed ischemia. He saw Dr. Sherene Dilling and is scheduled for coronary artery bypass surgery on 07/03/2023.  Mr. Arrindell generally feels well.  He has cough that he relates to allergies. Objective:  Vital signs in last 24 hours:  Blood pressure 105/69, pulse 68, temperature 98.1 F (36.7 C), temperature source Temporal, resp. rate 18, height 5\' 11"  (1.803 m), weight 165 lb 11.2 oz (75.2 kg), SpO2 98%.    Resp: Faint end inspiratory rhonchi at the left upper posterior chest, the lungs are otherwise clear, no respiratory distress Cardio: Regular rate and rhythm GI: No hepatosplenomegaly Vascular: No leg edema   Lab Results:  Lab Results  Component Value Date   WBC 7.4 07/01/2023   HGB 14.3 07/01/2023   HCT 44.9 07/01/2023   MCV 90.2 07/01/2023   PLT 245 07/01/2023   NEUTROABS 4.6 06/22/2023    CMP  Lab Results  Component Value Date   NA 139 07/01/2023   K 4.3 07/01/2023   CL 107 07/01/2023   CO2 25 07/01/2023   GLUCOSE 125 (H) 07/01/2023   BUN 20 07/01/2023   CREATININE 1.22 07/01/2023   CALCIUM  9.4 07/01/2023   PROT 7.4 07/01/2023   ALBUMIN 3.9 07/01/2023   AST 30 07/01/2023   ALT 28 07/01/2023   ALKPHOS 53 07/01/2023   BILITOT 1.0 07/01/2023   GFRNONAA >60 07/01/2023   GFRAA 58 (L) 09/11/2014    Imaging:  DG Chest 2 View Result Date:  07/01/2023 CLINICAL DATA:  Preop chest exam.  Upcoming CABG. EXAM: CHEST - 2 VIEW COMPARISON:  Chest radiograph 02/08/2015.  CT 06/13/2023 FINDINGS: The cardiomediastinal contours are normal. Aortic atherosclerosis. Pulmonary vasculature is normal. No consolidation, pleural effusion, or pneumothorax. No acute osseous abnormalities are seen. IMPRESSION: No active cardiopulmonary disease. Electronically Signed   By: Chadwick Colonel M.D.   On: 07/01/2023 11:44   VAS US  DOPPLER PRE CABG Result Date: 07/01/2023 PREOPERATIVE VASCULAR EVALUATION Patient Name:  FYODOR KRUZAN  Date of Exam:   07/01/2023 Medical Rec #: [id number]  Accession #:    [id number] Date of Birth: [birth year]  Patient Gender: M Patient Age:   74 years Exam Location:  [facility] Procedure:      VAS US  DOPPLER PRE CABG Referring Phys: Linder Revere --------------------------------------------------------------------------------  Indications:   Pre-CABG. Risk Factors:  Hyperlipidemia, coronary artery disease. 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 Performing Technologist: Franky Ivanoff Sturdivant-Jones RDMS, RVT  Examination Guidelines: A complete evaluation includes B-mode imaging, spectral Doppler, color Doppler, and power Doppler as needed of all accessible portions of each vessel. Bilateral testing is considered an integral part of a complete examination. Limited examinations for reoccurring indications may be performed as noted.  Right Carotid Findings: +----------+--------+--------+--------+--------+------------------+           PSV  cm/sEDV cm/sStenosisDescribeComments           +----------+--------+--------+--------+--------+------------------+ CCA Prox  69      18                                         +----------+--------+--------+--------+--------+------------------+ CCA Distal61      26                      intimal thickening  +----------+--------+--------+--------+--------+------------------+ ICA Prox  46      18      1-39%   calcific                   +----------+--------+--------+--------+--------+------------------+ ICA Distal63      26                                         +----------+--------+--------+--------+--------+------------------+ ECA       58      12                                         +----------+--------+--------+--------+--------+------------------+ +----------+--------+-------+----------------+------------+           PSV cm/sEDV cmsDescribe        Arm Pressure +----------+--------+-------+----------------+------------+ Subclavian93             Multiphasic, WNL             +----------+--------+-------+----------------+------------+ +---------+--------+--+--------+--+---------+ VertebralPSV cm/s41EDV cm/s11Antegrade +---------+--------+--+--------+--+---------+ Left Carotid Findings: +----------+--------+--------+--------+------------+------------------+           PSV cm/sEDV cm/sStenosisDescribe    Comments           +----------+--------+--------+--------+------------+------------------+ CCA Prox  81      27                                             +----------+--------+--------+--------+------------+------------------+ CCA Distal64      23                          intimal thickening +----------+--------+--------+--------+------------+------------------+ ICA Prox  63      27      1-39%   heterogenous                   +----------+--------+--------+--------+------------+------------------+ ICA Distal80      29                          tortuous           +----------+--------+--------+--------+------------+------------------+ ECA       58      14                                             +----------+--------+--------+--------+------------+------------------+ +----------+--------+--------+----------------+------------+ SubclavianPSV  cm/sEDV cm/sDescribe        Arm Pressure +----------+--------+--------+----------------+------------+           65  Multiphasic, WNL             +----------+--------+--------+----------------+------------+ +---------+--------+--+--------+--+---------+ VertebralPSV cm/s28EDV cm/s12Antegrade +---------+--------+--+--------+--+---------+  ABI Findings: +------------------+-----+---------+ Rt Pressure (mmHg)IndexWaveform  +------------------+-----+---------+ 120                    triphasic +------------------+-----+---------+ 158               1.24 triphasic +------------------+-----+---------+ 152               1.20 triphasic +------------------+-----+---------+ +------------------+-----+---------+ Lt Pressure (mmHg)IndexWaveform  +------------------+-----+---------+ 127                    triphasic +------------------+-----+---------+ 141               1.11 triphasic +------------------+-----+---------+ 133               1.05 triphasic +------------------+-----+---------+ +-------+---------------+----------------+ ABI/TBIToday's ABI/TBIPrevious ABI/TBI +-------+---------------+----------------+ Right  1.24           1.21/0.87        +-------+---------------+----------------+ Left   1.11           1.16/0.80        +-------+---------------+----------------+ Bilateral ABIs appear essentially unchanged compared to prior study on 12/30/2021. Right Doppler Findings: +-----------+--------+---------+ Site       PressureDoppler   +-----------+--------+---------+ Brachial   120     triphasic +-----------+--------+---------+ Radial             triphasic +-----------+--------+---------+ Palmar Arch        triphasic +-----------+--------+---------+  Left Doppler Findings: +--------+--------+---------+ Site    PressureDoppler   +--------+--------+---------+ [id number]     triphasic +--------+--------+---------+ Radial           triphasic +--------+--------+---------+ Ulnar           triphasic +--------+--------+---------+   Summary: Right Carotid: Velocities in the right ICA are consistent with a 1-39% stenosis. Left Carotid: Velocities in the left ICA are consistent with a 1-39% stenosis. Vertebrals:  Bilateral vertebral arteries demonstrate antegrade flow. Subclavians: Normal flow hemodynamics were seen in bilateral subclavian              arteries. Right ABI: Resting right ankle-brachial index is within normal range. Left ABI: Resting left ankle-brachial index is within normal range. Right Upper Extremity: Doppler waveforms remain within normal limits with right radial compression. Doppler waveforms decrease 50% with right ulnar compression. Left Upper Extremity: Doppler waveforms decrease 50% with left radial compression. Doppler waveforms remain within normal limits with left ulnar compression.     Preliminary     Medications: I have reviewed the patient's current medications.   Assessment/Plan: Protein S deficiency Right lower extremity deep vein thrombosis, unprovoked, June 2010 treated with 1 year of Coumadin  anticoagulation Placed back on Coumadin  after protein S level returned low while off of Coumadin  Anticoagulation changed to Xarelto  April 2015 Pulmonary embolism 08/07/2014- anticoagulation changed back to Coumadin   2.  History of latent tuberculosis treated with [name] in 2017 3.  Hypothyroidism 4.  History of collagen vascular disease-unspecified 5.  Right abdominal wall zoster 1982 6.  Coronary artery disease-scheduled for coronary artery bypass surgery 07/03/2023     Disposition: Mr. Mullally has a history of protein S deficiency.  He has a history of recurrent venous thrombosis.  He is maintained on indefinite Coumadin  anticoagulation.  He is scheduled for coronary artery bypass surgery 07/03/2023.  We discussed the indication for bridging anticoagulation.  I reviewed the case with Dr. Sherene Dilling.  Mr. Canning has  received bridging anticoagulation with procedures in the past.  I explained we not recommend bridging anticoagulation for most patients, but given his history of recurrent thrombosis and bridging therapy in the past he will receive Lovenox .  He is comfortable with this approach.  He discontinued Coumadin  after the dose on 06/27/2023.  He reports the PT/INR returned at 1.4 on his home monitor this morning.  He will take a 1 mg/kilogram dose of Lovenox  this evening and again in the morning.  He will then discontinue Lovenox .  Dr. Sherene Dilling will resume Coumadin  with prophylactic dosing of Lovenox  postoperatively.  I am available to see him in the hospital as needed.  He will resume Coumadin  with home PT/INR monitoring as an outpatient.  Mr. Lightfoot will return for an office visit in 1 year.  Coni Deep, MD  07/01/2023  12:19 PM

## 2023-07-01 NOTE — Progress Notes (Signed)
 PCP - Dr. Randie Bustle Cardiologist - Dr. Dorothye Gathers Hematologist - Dr. Coni Deep  PPM/ICD - Denies Device Orders - n/a Rep Notified - n/a  Chest x-ray - 07/01/2023 EKG - 06/04/2023 Stress Test - 05/27/2023 ECHO - 06/11/2023 Cardiac Cath - 06/11/2023  Sleep Study - +OSA. Pt wears oral device nightly.  Pt recently diagnosed with DM2. He checks his blood sugar everyday. Normal fasting range is 110s to 120s. CBG at pre-op appointment 110. A1c result pending (ordered by surgeon)  Last dose of GLP1 agonist- n/a GLP1 instructions: n/a  Blood Thinner Instructions: Pt instructed to stop Warfarin 5 days prior to surgery. Last dose was April 26th. He will complete a Lovenox  Bridge with instructions per Dr. Scherrie Curt (Pt hs Protein Type S) Aspirin  Instructions: n/a  NPO after midnight  COVID TEST- n/a   Anesthesia review: Yes. Hx of HTN, DM2, OSA, Protein Type S (lifelong warfarin).   Patient denies shortness of breath, fever, cough and chest pain at PAT appointment. Pt denies any respiratory illness/infection in the last two months.   All instructions explained to the patient, with a verbal understanding of the material. Patient agrees to go over the instructions while at home for a better understanding. Patient also instructed to self quarantine after being tested for COVID-19. The opportunity to ask questions was provided.

## 2023-07-02 ENCOUNTER — Encounter (HOSPITAL_COMMUNITY): Payer: Self-pay

## 2023-07-02 ENCOUNTER — Encounter: Payer: Self-pay | Admitting: Oncology

## 2023-07-02 ENCOUNTER — Telehealth: Payer: Self-pay | Admitting: *Deleted

## 2023-07-02 MED ORDER — CEFAZOLIN SODIUM-DEXTROSE 2-4 GM/100ML-% IV SOLN
2.0000 g | INTRAVENOUS | Status: AC
Start: 2023-07-03 — End: 2023-07-03
  Administered 2023-07-03 (×2): 2 g via INTRAVENOUS
  Filled 2023-07-02: qty 100

## 2023-07-02 MED ORDER — PHENYLEPHRINE HCL-NACL 20-0.9 MG/250ML-% IV SOLN
30.0000 ug/min | INTRAVENOUS | Status: AC
  Administered 2023-07-03: 30 ug/min via INTRAVENOUS
  Filled 2023-07-02: qty 250

## 2023-07-02 MED ORDER — NOREPINEPHRINE 4 MG/250ML-% IV SOLN
0.0000 ug/min | INTRAVENOUS | Status: DC
  Filled 2023-07-02: qty 250

## 2023-07-02 MED ORDER — POTASSIUM CHLORIDE 2 MEQ/ML IV SOLN
80.0000 meq | INTRAVENOUS | Status: DC
  Filled 2023-07-02: qty 40

## 2023-07-02 MED ORDER — TRANEXAMIC ACID (OHS) PUMP PRIME SOLUTION
2.0000 mg/kg | INTRAVENOUS | Status: DC
  Filled 2023-07-02: qty 1.5

## 2023-07-02 MED ORDER — INSULIN REGULAR(HUMAN) IN NACL 100-0.9 UT/100ML-% IV SOLN
INTRAVENOUS | Status: AC
Start: 2023-07-03 — End: 2023-07-03
  Administered 2023-07-03: .9 [IU]/h via INTRAVENOUS
  Filled 2023-07-02: qty 100

## 2023-07-02 MED ORDER — HEPARIN 30,000 UNITS/1000 ML (OHS) CELLSAVER SOLUTION
Status: DC
  Filled 2023-07-02: qty 1000

## 2023-07-02 MED ORDER — EPINEPHRINE HCL 5 MG/250ML IV SOLN IN NS
0.0000 ug/min | INTRAVENOUS | Status: DC
  Filled 2023-07-02: qty 250

## 2023-07-02 MED ORDER — MILRINONE LACTATE IN DEXTROSE 20-5 MG/100ML-% IV SOLN
0.3000 ug/kg/min | INTRAVENOUS | Status: DC
  Filled 2023-07-02: qty 100

## 2023-07-02 MED ORDER — PAPAVERINE HCL 30 MG/ML IJ SOLN
INTRAMUSCULAR | Status: DC
  Filled 2023-07-02: qty 2.5

## 2023-07-02 MED ORDER — TRANEXAMIC ACID (OHS) BOLUS VIA INFUSION
15.0000 mg/kg | INTRAVENOUS | Status: AC
  Administered 2023-07-03: 1128 mg via INTRAVENOUS
  Filled 2023-07-02: qty 1128

## 2023-07-02 MED ORDER — VANCOMYCIN HCL 1250 MG/250ML IV SOLN
1250.0000 mg | INTRAVENOUS | Status: AC
  Administered 2023-07-03: 1250 mg via INTRAVENOUS
  Filled 2023-07-02: qty 250

## 2023-07-02 MED ORDER — NITROGLYCERIN IN D5W 200-5 MCG/ML-% IV SOLN
2.0000 ug/min | INTRAVENOUS | Status: DC
  Filled 2023-07-02: qty 250

## 2023-07-02 MED ORDER — CEFAZOLIN SODIUM-DEXTROSE 2-4 GM/100ML-% IV SOLN
2.0000 g | INTRAVENOUS | Status: DC
  Filled 2023-07-02: qty 100

## 2023-07-02 MED ORDER — MAGNESIUM SULFATE 50 % IJ SOLN
40.0000 meq | INTRAMUSCULAR | Status: DC
  Filled 2023-07-02: qty 9.85

## 2023-07-02 MED ORDER — DEXMEDETOMIDINE HCL IN NACL 400 MCG/100ML IV SOLN
0.1000 ug/kg/h | INTRAVENOUS | Status: AC
Start: 2023-07-03 — End: 2023-07-03
  Administered 2023-07-03: .3 ug/kg/h via INTRAVENOUS
  Filled 2023-07-02: qty 100

## 2023-07-02 MED ORDER — TRANEXAMIC ACID 1000 MG/10ML IV SOLN
1.5000 mg/kg/h | INTRAVENOUS | Status: AC
  Administered 2023-07-03: 1.5 mg/kg/h via INTRAVENOUS
  Filled 2023-07-02: qty 25

## 2023-07-02 NOTE — Anesthesia Preprocedure Evaluation (Addendum)
 Anesthesia Evaluation  Patient identified by MRN, date of birth, ID band Patient awake    Reviewed: Allergy & Precautions, NPO status , Patient's Chart, lab work & pertinent test results  Airway Mallampati: I  TM Distance: >3 FB Neck ROM: Full    Dental  (+) Teeth Intact, Dental Advisory Given, Caps   Pulmonary sleep apnea , former smoker   breath sounds clear to auscultation       Cardiovascular + CAD and + Peripheral Vascular Disease   Rhythm:Regular Rate:Normal  Echo:  1. Left ventricular ejection fraction, by estimation, is 55 to 60%. The  left ventricle has normal function. The left ventricle has no regional  wall motion abnormalities. Left ventricular diastolic parameters are  indeterminate.   2. Right ventricular systolic function is normal. The right ventricular  size is normal. Tricuspid regurgitation signal is inadequate for assessing  PA pressure.   3. The mitral valve is degenerative. Trivial mitral valve regurgitation.  No evidence of mitral stenosis.   4. The aortic valve is tricuspid. Aortic valve regurgitation is trivial.  Aortic valve sclerosis is present, with no evidence of aortic valve  stenosis.   5. Aortic dilatation noted. There is dilatation of the aortic root,  measuring 40 mm.   Cath:  - 1st Diag lesion is 95% stenosed.   Prox LAD lesion is 70% stenosed.   3rd Mrg lesion is 20% stenosed.   Prox RCA lesion is 80% stenosed.    Neuro/Psych   Anxiety      Neuromuscular disease    GI/Hepatic Neg liver ROS,GERD  Medicated,,  Endo/Other  diabetes    Renal/GU negative Renal ROS     Musculoskeletal  (+) Arthritis ,    Abdominal   Peds  Hematology   Anesthesia Other Findings   Reproductive/Obstetrics                             Anesthesia Physical Anesthesia Plan  ASA: 4  Anesthesia Plan: General   Post-op Pain Management:    Induction:  Intravenous  PONV Risk Score and Plan: 3 and Ondansetron , Dexamethasone  and Midazolam   Airway Management Planned: Oral ETT  Additional Equipment: Arterial line, CVP, PA Cath, TEE and Ultrasound Guidance Line Placement  Intra-op Plan:   Post-operative Plan: Post-operative intubation/ventilation  Informed Consent: I have reviewed the patients History and Physical, chart, labs and discussed the procedure including the risks, benefits and alternatives for the proposed anesthesia with the patient or authorized representative who has indicated his/her understanding and acceptance.       Plan Discussed with: CRNA  Anesthesia Plan Comments: (On warfarin for protein S deficiency with recurrent venous thrombosis.  Managed by Dr. Sharalyn Dasen. He discontinued Coumadin  after the dose on 06/27/2023 and is on 1 mg/kilogram dose of Lovenox , last dose planned for 07/02/23 AM with plans for prophylactic dosing of postoperatively.  Allison Zelenak, PA-C 51/25 9:17 AM)        Anesthesia Quick Evaluation

## 2023-07-02 NOTE — Telephone Encounter (Signed)
 INR today 1.4. Left VM for patient to check INR tomorrow am and report to Dr. Sherene Dilling. Also sent via MyChart.

## 2023-07-02 NOTE — H&P (Signed)
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  He has a brother who had a heart attack and 2 stents placed in [place].  The patient had an episode of substernal chest discomfort and called his brother to see what his chest pain symptoms were like and they matched his so he called Dr. Renna Cary.  He had a PET stress test performed on 05/27/2023 which showed flow reduction in the anterior wall distribution consistent with ischemia.  Left ventricular ejection fraction is 48%.  He subsequently underwent coronary angiography showing a 70 to 80% proximal LAD stenosis and 95% stenosis at a large first diagonal branch.  There is about 20% in a marginal branch and the RCA had a long area of 80% proximal to mid vessel stenosis.   The patient lives with his wife.   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  He volunteers at [facility].  His wife had cancer and was followed by oncology there.     Past Medical History:  Diagnosis Date   Acid reflux 02/03/2016   Allergy      seasonal   Anxiety 12/01/2016   Chronic anticoagulation 06/14/2013   Coagulopathy ([name]) 05/16/2011   Collagen vascular disease ([name])     Cough 12/01/2016   DVT (deep venous thrombosis) ([name])      takes coumadin    Dysuria 02/27/2013   Erectile dysfunction 12/28/2016   Hereditary protein S deficiency ([name]) 05/16/2011   Hyperglycemia 12/26/2014   Hyperlipidemia     Hypothyroid 06/10/2011   Insomnia 04/24/2016   Left hip  pain 12/17/2010   MCI (mild cognitive impairment) 07/22/2016   Nasal lesion 07/22/2016   Polymyalgia ([name]) 03/21/2014   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   Preventative health care 12/17/2010   Shingles 1982    right abdominal wall   Staph skin infection 07/22/2016   Thyroid  nodule 11/29/2014   Urinary hesitancy 09/30/2016   Vitamin D  deficiency 10/14/2011               Past Surgical History:  Procedure Laterality Date   LEFT HEART CATH AND CORONARY ANGIOGRAPHY N/A 06/11/2023    Procedure: LEFT HEART CATH AND CORONARY ANGIOGRAPHY;  Surgeon: Thukkani, Arun K, MD;  Location: [facility];  Service: Cardiovascular;  Laterality: N/A;   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78 yrs old    4 surgeries               Family History  Problem Relation Age of Onset   Cancer Mother          unknown- everywhere   Cancer Sister          breast          Social History Social History  Social History         Tobacco  Use   Smoking status: Former      Current packs/day: 0.00      Average packs/day: 1 pack/day for 20.0 years (20.0 ttl pk-yrs)      Types: Cigarettes      Start date: 03/03/1960      Quit date: 03/03/1980      Years since quitting: 43.3   Smokeless tobacco: Never  Substance Use Topics   Alcohol use: No      Alcohol/week: 0.0 standard drinks of alcohol   Drug use: No              Current Outpatient Medications  Medication Sig Dispense Refill   ACCU-CHEK GUIDE TEST test strip USE TO CHECK BLOOD SUGAR ONCE DAILY . E11.9 100 strip 1   Accu-Chek Softclix Lancets lancets USE AS DIRECTED TO CHECK SUGAR ONCE DAILY.E11.9 100 each 1   Blood Glucose Monitoring Suppl (ONETOUCH VERIO FLEX SYSTEM) w/Device KIT Use to check blood sugar once a day. DX E11.9 1 kit 0   cetirizine  (ZYRTEC ) 10 MG tablet Take 10 mg by mouth daily as needed for allergies.       Cholecalciferol 4000 UNITS TABS Take 1 tablet by mouth daily.       Coenzyme Q10 (COQ10) 100 MG CAPS Take 100 mg by mouth  daily.       DULoxetine (CYMBALTA) 30 MG capsule Take 30 mg by mouth at bedtime.       DULoxetine (CYMBALTA) 60 MG capsule Take 60 mg by mouth daily.       Evolocumab  (REPATHA  SURECLICK) 140 MG/ML SOAJ Inject 140 mg into the skin every 14 (fourteen) days. 6 mL 3   pantoprazole  (PROTONIX ) 40 MG tablet TAKE 1 TABLET BY MOUTH EVERY DAY 90 tablet 1   rosuvastatin  (CRESTOR ) 10 MG tablet TAKE 1 TABLET BY MOUTH EVERY DAY 90 tablet 3   thyroid  (ARMOUR THYROID ) 60 MG tablet TAKE ONE TABLET BY MOUTH ON MONDAY, WEDNESDAY, THURSDAY, FRIDAY, AND SUNDAY. (Patient taking differently: Take 60 mg by mouth daily before breakfast.) 65 tablet 1   warfarin (COUMADIN ) 4 MG tablet Take 0.5-1 tablets (2-4 mg total) by mouth See admin instructions. 4 mg on Sun Mon Wed Fri and 2 mg Tues Thurs and Sat 90 tablet 1   zinc gluconate 50 MG tablet Take 50 mg by mouth daily.       enoxaparin  (LOVENOX ) 120 MG/0.8ML injection Inject 0.8 mLs (120 mg total) into the skin daily. Inject on 4/09 and on 4/11, 4/12 and 4/13 3.2 mL 0      No current facility-administered medications for this visit.        Allergies  No Known Allergies     Review of Systems  Constitutional:  Positive for activity change and fatigue.  HENT: Negative.    Eyes: Negative.   Respiratory:  Negative for shortness of breath.   Cardiovascular:  Positive for chest pain. Negative for palpitations and leg swelling.  Gastrointestinal: Negative.   Endocrine: Negative.   Genitourinary: Negative.   Musculoskeletal: Negative.   Allergic/Immunologic: Negative.   Neurological:  Negative for dizziness and syncope.  Hematological: Negative.   Psychiatric/Behavioral: Negative.        BP 113/73   Pulse 83   Resp 20   Ht 5\' 11"  (1.803 m)   Wt 171 lb (77.6 kg)   SpO2 97% Comment: RA  BMI 23.85 kg/m  Physical Exam Constitutional:      Appearance: Normal appearance. He is normal weight.  HENT:  Head: Normocephalic and atraumatic.  Eyes:      Extraocular Movements: Extraocular movements intact.     Conjunctiva/sclera: Conjunctivae normal.     Pupils: Pupils are equal, round, and reactive to light.  Cardiovascular:     Rate and Rhythm: Normal rate and regular rhythm.     Pulses: Normal pulses.     Heart sounds: Normal heart sounds. No murmur heard. Pulmonary:     Effort: Pulmonary effort is normal.     Breath sounds: Normal breath sounds.  Abdominal:     General: Abdomen is flat. There is no distension.     Palpations: Abdomen is soft.     Tenderness: There is no abdominal tenderness.  Musculoskeletal:        General: No swelling.     Cervical back: Normal range of motion and neck supple.  Skin:    General: Skin is warm and dry.  Neurological:     General: No focal deficit present.     Mental Status: He is alert and oriented to person, place, and time.  Psychiatric:        Mood and Affect: Mood normal.        Behavior: Behavior normal.          Diagnostic Tests:   Physicians   Panel Physicians Referring Physician Case Authorizing Physician  Thukkani, Arun K, MD (Primary)        Procedures   LEFT HEART CATH AND CORONARY ANGIOGRAPHY    Conclusion       1st Diag lesion is 95% stenosed.   Prox LAD lesion is 70% stenosed.   3rd Mrg lesion is 20% stenosed.   Prox RCA lesion is 80% stenosed.   1.  Severe multivessel disease consisting of proximal LAD, first diagonal, and proximal right coronary artery lesions. 2.  LVEDP of 16 mmHg.   Recommendation: Cardiothoracic surgical consultation.  Results reviewed with patient's wife.   Indications   Abnormal stress test [R94.39 (ICD-10-CM)]    Procedural Details   Technical Details The patient is a 78 year old male with a history of hyperlipidemia and a protein S deficiency on chronic warfarin therapy who is seen in the outpatient setting due to exertional angina.  He is referred for stress test which demonstrated intermediate risk findings.  He is referred for  coronary angiography for further evaluation of an abnormal stress test.  After obtaining consent, the patient brought to the cardiac catheterization laboratory and prepped draped in sterile fashion.  Xylocaine  is used to anesthetize the right wrist and a 6 French Terumo glide sheath was placed.  5000 units heparin  and 5 mg verapamil  were administered through the sheath.  A 6 [place] TIG catheter was used for selective coronary angiography.  A 5 French pigtail catheter was used for left ventricular end-diastolic pressure assessment.  After review of the angiogram, no further interventions were pursued.  A TR band was placed.  There were no acute complications. Estimated blood loss <50 mL.   During this procedure medications were administered to achieve and maintain moderate conscious sedation while the patient's heart rate, blood pressure, and oxygen saturation were continuously monitored and I was present face-to-face 100% of this time.     Logan Boothe Rad Tech and [employer] are independent, trained observers who assisted in the monitoring of the patient's level of consciousness.    Medications (Filter: Administrations occurring from 1247 to 1326 on 06/11/23) midazolam  (VERSED ) injection (mg)  Total dose: 1 mg Date/Time Rate/Dose/Volume Action  06/11/23 1301 1 mg Given    fentaNYL  (SUBLIMAZE ) injection (mcg)  Total dose: 25 mcg Date/Time Rate/Dose/Volume Action    06/11/23 1301 25 mcg Given    lidocaine  (PF) (XYLOCAINE ) 1 % injection (mL)  Total volume: 2 mL Date/Time Rate/Dose/Volume Action    06/11/23 1305 2 mL Given    Radial Cocktail/Verapamil  only (mL)  Total volume: 10 mL Date/Time Rate/Dose/Volume Action    06/11/23 1306 10 mL Given    heparin  sodium (porcine) injection (Units)  Total dose: 5,000 Units Date/Time Rate/Dose/Volume Action    06/11/23 1307 5,000 Units Given    Heparin  (Porcine) in NaCl 1000-0.9 UT/500ML-% SOLN (mL)  Total volume: 1,000  mL Date/Time Rate/Dose/Volume Action    06/11/23 1307 1,000 mL Given    iohexol  (OMNIPAQUE ) 350 MG/ML injection (mL)  Total volume: 30 mL Date/Time Rate/Dose/Volume Action    06/11/23 1320 30 mL Given      Sedation Time   Sedation Time Physician-1: 15 minutes 21 seconds Contrast        Administrations occurring from 1247 to 1326 on 06/11/23:  Medication Name Total Dose  iohexol  (OMNIPAQUE ) 350 MG/ML injection 30 mL    Radiation/Fluoro   Fluoro time: 2.4 (min) DAP: 19983 (mGycm2) Cumulative Air Kerma: 254 (mGy) Complications   Complications documented before study signed (06/11/2023  1:35 PM)    No complications were associated with this study.  Documented by Ned Balint, RT - 06/11/2023  1:25 PM      Coronary Findings   Diagnostic Dominance: Right Left Anterior Descending  Prox LAD lesion is 70% stenosed.    First Diagonal Branch  1st Diag lesion is 95% stenosed.    Left Circumflex    Third Obtuse Marginal Branch  3rd Mrg lesion is 20% stenosed.    Right Coronary Artery  Prox RCA lesion is 80% stenosed.    Intervention    No interventions have been documented.    Coronary Diagrams   Diagnostic Dominance: Right  Intervention    Implants    No implant documentation for this case.    Syngo Images    Show images for CARDIAC CATHETERIZATION Images on Long Term Storage    Show images for Adrius, Lindert to Procedure Log   Procedure Log    Hemo Data   Flowsheet Row Most Recent Value  Aortic Mean Gradient 4.62 mmHg  Aortic Peak Gradient 2.3 mmHg  LV Systolic Pressure 117 mmHg  LV Diastolic Pressure 2 mmHg  LV EDP 16 mmHg  Arterial Occlusion Pressure Extended Systolic Pressure 114 mmHg  Arterial Occlusion Pressure Extended Diastolic Pressure 68 mmHg  Arterial Occlusion Pressure Extended Mean Pressure 87 mmHg  Left Ventricular Apex Extended Systolic Pressure 114 mmHg  LVp Diastolic Pressure 2 mmHg  Left Ventricular Apex Extended EDP  Pressure 15 mmHg    ECHOCARDIOGRAM REPORT       Patient Name:   Paul Ryan  Date of Exam: 06/11/2023  Medical Rec #:  [id number]  Height:       71.0 in  Accession #:    [id number] Weight:       171.0 lb  Date of Birth:  [birth year]  BSA:          1.973 m  Patient Age:    77 years   BP:           99/67 mmHg  Patient Gender: M          HR:  51 bpm.  Exam Location:  Inpatient   Procedure: 2D Echo, Cardiac Doppler and Color Doppler (Both Spectral and  Color            Flow Doppler were utilized during procedure).   Indications:    Z01.818 Encounter for other preprocedural examination.;  I25.110                 Atherosclerotic heart disease of native coronary artery  with                 unstable angina pectoris    History:        Patient has prior history of Echocardiogram examinations,  most                 recent 03/01/2018. CAD, Signs/Symptoms:Dyspnea and  Shortness of                  Breath; Risk Factors:Hypertension and Diabetes. Pulmonary                  embolus.    Sonographer:    Raynelle Callow RDCS  Referring Phys: [id number] [facility]to [facility]     Sonographer Comments: Suboptimal subcostal window. Patient post op with  arm across chest.  IMPRESSIONS     1. Left ventricular ejection fraction, by estimation, is 55 to 60%. The  left ventricle has normal function. The left ventricle has no regional  wall motion abnormalities. Left ventricular diastolic parameters are  indeterminate.   2. Right ventricular systolic function is normal. The right ventricular  size is normal. Tricuspid regurgitation signal is inadequate for assessing  PA pressure.   3. The mitral valve is degenerative. Trivial mitral valve regurgitation.  No evidence of mitral stenosis.   4. The aortic valve is tricuspid. Aortic valve regurgitation is trivial.  Aortic valve sclerosis is present, with no evidence of aortic valve  stenosis.   5. Aortic dilatation noted. There is dilatation of the aortic  root,  measuring 40 mm.   FINDINGS   Left Ventricle: Left ventricular ejection fraction, by estimation, is 55  to 60%. The left ventricle has normal function. The left ventricle has no  regional wall motion abnormalities. The left ventricular internal cavity  size was normal in size. There is   no left ventricular hypertrophy. Left ventricular diastolic parameters  are indeterminate.   Right Ventricle: The right ventricular size is normal. No increase in  right ventricular wall thickness. Right ventricular systolic function is  normal. Tricuspid regurgitation signal is inadequate for assessing PA  pressure.   Left Atrium: Left atrial size was normal in size.   Right Atrium: Right atrial size was normal in size.   Pericardium: There is no evidence of pericardial effusion.   Mitral Valve: The mitral valve is degenerative in appearance. Mild to  moderate mitral annular calcification. Trivial mitral valve regurgitation.  No evidence of mitral valve stenosis.   Tricuspid Valve: The tricuspid valve is normal in structure. Tricuspid  valve regurgitation is not demonstrated. No evidence of tricuspid  stenosis.   Aortic Valve: The aortic valve is tricuspid. Aortic valve regurgitation is  trivial. Aortic valve sclerosis is present, with no evidence of aortic  valve stenosis.   Pulmonic Valve: The pulmonic valve was normal in structure. Pulmonic valve  regurgitation is not visualized. No evidence of pulmonic stenosis.   Aorta: Aortic dilatation noted. There is dilatation of the aortic root,  measuring 40 mm.   IAS/Shunts: No atrial level  shunt detected by color flow Doppler.     LEFT VENTRICLE  PLAX 2D  LVIDd:         4.05 cm     Diastology  LVIDs:         2.60 cm     LV e' medial:    7.07 cm/s  LV PW:         0.95 cm     LV E/e' medial:  10.9  LV IVS:        0.90 cm     LV e' lateral:   11.00 cm/s  LVOT diam:     2.20 cm     LV E/e' lateral: 7.0  LV SV:         77  LV SV  Index:   39  LVOT Area:     3.80 cm    LV Volumes (MOD)  LV vol d, MOD A2C: 68.6 ml  LV vol d, MOD A4C: 95.6 ml  LV vol s, MOD A2C: 25.7 ml  LV vol s, MOD A4C: 35.2 ml  LV SV MOD A2C:     42.9 ml  LV SV MOD A4C:     95.6 ml  LV SV MOD BP:      48.7 ml   RIGHT VENTRICLE             IVC  RV S prime:     11.20 cm/s  IVC diam: 1.10 cm  TAPSE (M-mode): 2.2 cm   LEFT ATRIUM             Index        RIGHT ATRIUM           Index  LA diam:        3.10 cm 1.57 cm/m   RA Area:     10.30 cm  LA Vol (A2C):   42.4 ml 21.49 ml/m  RA Volume:   19.70 ml  9.99 ml/m  LA Vol (A4C):   17.6 ml 8.92 ml/m  LA Biplane Vol: 28.4 ml 14.40 ml/m   AORTIC VALVE  LVOT Vmax:   94.00 cm/s  LVOT Vmean:  59.000 cm/s  LVOT VTI:    0.203 m    AORTA  Ao Root diam: 4.00 cm  Ao Asc diam:  3.80 cm   MITRAL VALVE  MV Area (PHT): 3.37 cm    SHUNTS  MV Decel Time: 225 msec    Systemic VTI:  0.20 m  MV E velocity: 77.00 cm/s  Systemic Diam: 2.20 cm  MV A velocity: 77.00 cm/s  MV E/A ratio:  1.00   Kardie Tobb DO  Electronically signed by Jerryl Morin DO  Signature Date/Time: 06/11/2023/4:42:10 PM        Final        Impression:   This 78 year old gentleman has severe multivessel coronary disease involving the proximal LAD, first diagonal, and right coronary arteries.  I agree that coronary artery bypass graft surgery is the best treatment to prevent ischemia and infarction and improve his quality of life. I discussed the operative procedure with the patient, wife and daughter including alternatives, benefits and risks; including but not limited to bleeding, blood transfusion, infection, stroke, myocardial infarction, graft failure, heart block requiring a permanent pacemaker, organ dysfunction, and death.  [name] understands and agrees to proceed.       Plan:   CABG Friday 07/03/2023.      Bartley Lightning, MD [facility] [phone])  [phone]   

## 2023-07-02 NOTE — Telephone Encounter (Signed)
 FYI

## 2023-07-03 ENCOUNTER — Other Ambulatory Visit: Payer: Self-pay

## 2023-07-03 ENCOUNTER — Encounter (HOSPITAL_COMMUNITY): Payer: Self-pay | Admitting: Surgery

## 2023-07-03 ENCOUNTER — Inpatient Hospital Stay (HOSPITAL_COMMUNITY)
Admission: RE | Admit: 2023-07-03 | Discharge: 2023-07-08 | DRG: 236 | Disposition: A | Discharge status: Home / Self Care | Attending: Surgery | Admitting: Surgery

## 2023-07-03 ENCOUNTER — Inpatient Hospital Stay (HOSPITAL_COMMUNITY): Payer: Self-pay | Admitting: Certified Registered Nurse Anesthetist

## 2023-07-03 ENCOUNTER — Inpatient Hospital Stay (HOSPITAL_COMMUNITY)

## 2023-07-03 ENCOUNTER — Inpatient Hospital Stay (HOSPITAL_COMMUNITY): Payer: Self-pay | Admitting: Vascular Surgery

## 2023-07-03 ENCOUNTER — Inpatient Hospital Stay (HOSPITAL_COMMUNITY)
Admission: RE | Disposition: A | Discharge status: Home / Self Care | Payer: Self-pay | Source: Home / Self Care | Attending: Surgery

## 2023-07-03 DIAGNOSIS — Z79890 Hormone replacement therapy (postmenopausal): Secondary | ICD-10-CM

## 2023-07-03 DIAGNOSIS — R918 Other nonspecific abnormal finding of lung field: Secondary | ICD-10-CM | POA: Diagnosis not present

## 2023-07-03 DIAGNOSIS — D6859 Other primary thrombophilia: Secondary | ICD-10-CM | POA: Diagnosis present

## 2023-07-03 DIAGNOSIS — Z79899 Other long term (current) drug therapy: Secondary | ICD-10-CM

## 2023-07-03 DIAGNOSIS — G4730 Sleep apnea, unspecified: Secondary | ICD-10-CM | POA: Diagnosis not present

## 2023-07-03 DIAGNOSIS — Z7901 Long term (current) use of anticoagulants: Secondary | ICD-10-CM

## 2023-07-03 DIAGNOSIS — Z8249 Family history of ischemic heart disease and other diseases of the circulatory system: Secondary | ICD-10-CM | POA: Diagnosis not present

## 2023-07-03 DIAGNOSIS — Z87891 Personal history of nicotine dependence: Secondary | ICD-10-CM | POA: Diagnosis not present

## 2023-07-03 DIAGNOSIS — K219 Gastro-esophageal reflux disease without esophagitis: Secondary | ICD-10-CM | POA: Diagnosis present

## 2023-07-03 DIAGNOSIS — E876 Hypokalemia: Secondary | ICD-10-CM | POA: Diagnosis present

## 2023-07-03 DIAGNOSIS — Z951 Presence of aortocoronary bypass graft: Secondary | ICD-10-CM | POA: Diagnosis not present

## 2023-07-03 DIAGNOSIS — E119 Type 2 diabetes mellitus without complications: Secondary | ICD-10-CM

## 2023-07-03 DIAGNOSIS — Z86711 Personal history of pulmonary embolism: Secondary | ICD-10-CM | POA: Diagnosis not present

## 2023-07-03 DIAGNOSIS — E1169 Type 2 diabetes mellitus with other specified complication: Secondary | ICD-10-CM | POA: Diagnosis present

## 2023-07-03 DIAGNOSIS — R110 Nausea: Secondary | ICD-10-CM | POA: Diagnosis not present

## 2023-07-03 DIAGNOSIS — I440 Atrioventricular block, first degree: Secondary | ICD-10-CM | POA: Diagnosis present

## 2023-07-03 DIAGNOSIS — I2510 Atherosclerotic heart disease of native coronary artery without angina pectoris: Secondary | ICD-10-CM

## 2023-07-03 DIAGNOSIS — Z48812 Encounter for surgical aftercare following surgery on the circulatory system: Secondary | ICD-10-CM | POA: Diagnosis not present

## 2023-07-03 DIAGNOSIS — Z8615 Personal history of latent tuberculosis infection: Secondary | ICD-10-CM | POA: Diagnosis not present

## 2023-07-03 DIAGNOSIS — Z86718 Personal history of other venous thrombosis and embolism: Secondary | ICD-10-CM

## 2023-07-03 DIAGNOSIS — N400 Enlarged prostate without lower urinary tract symptoms: Secondary | ICD-10-CM | POA: Diagnosis present

## 2023-07-03 DIAGNOSIS — E039 Hypothyroidism, unspecified: Secondary | ICD-10-CM | POA: Diagnosis present

## 2023-07-03 DIAGNOSIS — E785 Hyperlipidemia, unspecified: Secondary | ICD-10-CM | POA: Diagnosis present

## 2023-07-03 DIAGNOSIS — E1151 Type 2 diabetes mellitus with diabetic peripheral angiopathy without gangrene: Secondary | ICD-10-CM | POA: Diagnosis present

## 2023-07-03 DIAGNOSIS — M353 Polymyalgia rheumatica: Secondary | ICD-10-CM | POA: Diagnosis present

## 2023-07-03 DIAGNOSIS — J90 Pleural effusion, not elsewhere classified: Secondary | ICD-10-CM | POA: Diagnosis not present

## 2023-07-03 DIAGNOSIS — J9811 Atelectasis: Secondary | ICD-10-CM | POA: Diagnosis not present

## 2023-07-03 DIAGNOSIS — J939 Pneumothorax, unspecified: Secondary | ICD-10-CM | POA: Diagnosis not present

## 2023-07-03 DIAGNOSIS — Z4682 Encounter for fitting and adjustment of non-vascular catheter: Secondary | ICD-10-CM | POA: Diagnosis not present

## 2023-07-03 HISTORY — PX: INTRAOPERATIVE TRANSESOPHAGEAL ECHOCARDIOGRAM: SHX5062

## 2023-07-03 HISTORY — PX: CORONARY ARTERY BYPASS GRAFT: SHX141

## 2023-07-03 LAB — POCT I-STAT, CHEM 8
BUN: 22 mg/dL (ref 8–23)
BUN: 20 mg/dL (ref 8–23)
BUN: 21 mg/dL (ref 8–23)
BUN: 19 mg/dL (ref 8–23)
Calcium, Ion: 1.2 mmol/L (ref 1.15–1.40)
Calcium, Ion: 1.07 mmol/L — ABNORMAL LOW (ref 1.15–1.40)
Calcium, Ion: 1.23 mmol/L (ref 1.15–1.40)
Calcium, Ion: 1.12 mmol/L — ABNORMAL LOW (ref 1.15–1.40)
Chloride: 108 mmol/L (ref 98–111)
Chloride: 106 mmol/L (ref 98–111)
Chloride: 108 mmol/L (ref 98–111)
Chloride: 104 mmol/L (ref 98–111)
Creatinine, Ser: 1.1 mg/dL (ref 0.61–1.24)
Creatinine, Ser: 1.1 mg/dL (ref 0.61–1.24)
Creatinine, Ser: 1.1 mg/dL (ref 0.61–1.24)
Creatinine, Ser: 0.9 mg/dL (ref 0.61–1.24)
Glucose, Bld: 108 mg/dL — ABNORMAL HIGH (ref 70–99)
Glucose, Bld: 119 mg/dL — ABNORMAL HIGH (ref 70–99)
Glucose, Bld: 125 mg/dL — ABNORMAL HIGH (ref 70–99)
Glucose, Bld: 120 mg/dL — ABNORMAL HIGH (ref 70–99)
HCT: 40 % (ref 39.0–52.0)
HCT: 32 % — ABNORMAL LOW (ref 39.0–52.0)
HCT: 38 % — ABNORMAL LOW (ref 39.0–52.0)
HCT: 28 % — ABNORMAL LOW (ref 39.0–52.0)
Hemoglobin: 13.6 g/dL (ref 13.0–17.0)
Hemoglobin: 10.9 g/dL — ABNORMAL LOW (ref 13.0–17.0)
Hemoglobin: 12.9 g/dL — ABNORMAL LOW (ref 13.0–17.0)
Hemoglobin: 9.5 g/dL — ABNORMAL LOW (ref 13.0–17.0)
Potassium: 4.2 mmol/L (ref 3.5–5.1)
Potassium: 4.7 mmol/L (ref 3.5–5.1)
Potassium: 5 mmol/L (ref 3.5–5.1)
Potassium: 4.3 mmol/L (ref 3.5–5.1)
Sodium: 139 mmol/L (ref 135–145)
Sodium: 138 mmol/L (ref 135–145)
Sodium: 139 mmol/L (ref 135–145)
Sodium: 138 mmol/L (ref 135–145)
TCO2: 24 mmol/L (ref 22–32)
TCO2: 22 mmol/L (ref 22–32)
TCO2: 24 mmol/L (ref 22–32)
TCO2: 25 mmol/L (ref 22–32)

## 2023-07-03 LAB — POCT I-STAT 7, (LYTES, BLD GAS, ICA,H+H)
Acid-base deficit: 5 mmol/L — ABNORMAL HIGH (ref 0.0–2.0)
Acid-base deficit: 2 mmol/L (ref 0.0–2.0)
Acid-base deficit: 3 mmol/L — ABNORMAL HIGH (ref 0.0–2.0)
Acid-base deficit: 3 mmol/L — ABNORMAL HIGH (ref 0.0–2.0)
Acid-base deficit: 4 mmol/L — ABNORMAL HIGH (ref 0.0–2.0)
Bicarbonate: 20.8 mmol/L (ref 20.0–28.0)
Bicarbonate: 23.3 mmol/L (ref 20.0–28.0)
Bicarbonate: 22.4 mmol/L (ref 20.0–28.0)
Bicarbonate: 22.4 mmol/L (ref 20.0–28.0)
Bicarbonate: 21.3 mmol/L (ref 20.0–28.0)
Calcium, Ion: 1.23 mmol/L (ref 1.15–1.40)
Calcium, Ion: 1.02 mmol/L — ABNORMAL LOW (ref 1.15–1.40)
Calcium, Ion: 1.07 mmol/L — ABNORMAL LOW (ref 1.15–1.40)
Calcium, Ion: 1.12 mmol/L — ABNORMAL LOW (ref 1.15–1.40)
Calcium, Ion: 1.11 mmol/L — ABNORMAL LOW (ref 1.15–1.40)
HCT: 40 % (ref 39.0–52.0)
HCT: 29 % — ABNORMAL LOW (ref 39.0–52.0)
HCT: 31 % — ABNORMAL LOW (ref 39.0–52.0)
HCT: 31 % — ABNORMAL LOW (ref 39.0–52.0)
HCT: 29 % — ABNORMAL LOW (ref 39.0–52.0)
Hemoglobin: 13.6 g/dL (ref 13.0–17.0)
Hemoglobin: 9.9 g/dL — ABNORMAL LOW (ref 13.0–17.0)
Hemoglobin: 10.5 g/dL — ABNORMAL LOW (ref 13.0–17.0)
Hemoglobin: 10.5 g/dL — ABNORMAL LOW (ref 13.0–17.0)
Hemoglobin: 9.9 g/dL — ABNORMAL LOW (ref 13.0–17.0)
O2 Saturation: 100 %
O2 Saturation: 100 %
O2 Saturation: 100 %
O2 Saturation: 100 %
O2 Saturation: 99 %
Patient temperature: 36.1
Potassium: 4.2 mmol/L (ref 3.5–5.1)
Potassium: 5.7 mmol/L — ABNORMAL HIGH (ref 3.5–5.1)
Potassium: 5.1 mmol/L (ref 3.5–5.1)
Potassium: 4.3 mmol/L (ref 3.5–5.1)
Potassium: 3.8 mmol/L (ref 3.5–5.1)
Sodium: 140 mmol/L (ref 135–145)
Sodium: 138 mmol/L (ref 135–145)
Sodium: 136 mmol/L (ref 135–145)
Sodium: 139 mmol/L (ref 135–145)
Sodium: 141 mmol/L (ref 135–145)
TCO2: 22 mmol/L (ref 22–32)
TCO2: 25 mmol/L (ref 22–32)
TCO2: 24 mmol/L (ref 22–32)
TCO2: 24 mmol/L (ref 22–32)
TCO2: 22 mmol/L (ref 22–32)
pCO2 arterial: 40.1 mmHg (ref 32–48)
pCO2 arterial: 43.1 mmHg (ref 32–48)
pCO2 arterial: 39.2 mmHg (ref 32–48)
pCO2 arterial: 40.5 mmHg (ref 32–48)
pCO2 arterial: 36.9 mmHg (ref 32–48)
pH, Arterial: 7.323 — ABNORMAL LOW (ref 7.35–7.45)
pH, Arterial: 7.341 — ABNORMAL LOW (ref 7.35–7.45)
pH, Arterial: 7.365 (ref 7.35–7.45)
pH, Arterial: 7.351 (ref 7.35–7.45)
pH, Arterial: 7.366 (ref 7.35–7.45)
pO2, Arterial: 412 mmHg — ABNORMAL HIGH (ref 83–108)
pO2, Arterial: 457 mmHg — ABNORMAL HIGH (ref 83–108)
pO2, Arterial: 287 mmHg — ABNORMAL HIGH (ref 83–108)
pO2, Arterial: 350 mmHg — ABNORMAL HIGH (ref 83–108)
pO2, Arterial: 148 mmHg — ABNORMAL HIGH (ref 83–108)

## 2023-07-03 LAB — HEMOGLOBIN AND HEMATOCRIT, BLOOD
HCT: 30.9 % — ABNORMAL LOW (ref 39.0–52.0)
Hemoglobin: 10.1 g/dL — ABNORMAL LOW (ref 13.0–17.0)

## 2023-07-03 LAB — CBC
HCT: 33.1 % — ABNORMAL LOW (ref 39.0–52.0)
HCT: 31 % — ABNORMAL LOW (ref 39.0–52.0)
Hemoglobin: 10.8 g/dL — ABNORMAL LOW (ref 13.0–17.0)
Hemoglobin: 10.1 g/dL — ABNORMAL LOW (ref 13.0–17.0)
MCH: 29.1 pg (ref 26.0–34.0)
MCH: 29.3 pg (ref 26.0–34.0)
MCHC: 32.6 g/dL (ref 30.0–36.0)
MCHC: 32.6 g/dL (ref 30.0–36.0)
MCV: 89.2 fL (ref 80.0–100.0)
MCV: 89.9 fL (ref 80.0–100.0)
Platelets: 138 10*3/uL — ABNORMAL LOW (ref 150–400)
Platelets: 115 10*3/uL — ABNORMAL LOW (ref 150–400)
RBC: 3.71 MIL/uL — ABNORMAL LOW (ref 4.22–5.81)
RBC: 3.45 MIL/uL — ABNORMAL LOW (ref 4.22–5.81)
RDW: 13.4 % (ref 11.5–15.5)
RDW: 13.6 % (ref 11.5–15.5)
WBC: 14.8 10*3/uL — ABNORMAL HIGH (ref 4.0–10.5)
WBC: 9.1 10*3/uL (ref 4.0–10.5)
nRBC: 0 % (ref 0.0–0.2)
nRBC: 0 % (ref 0.0–0.2)

## 2023-07-03 LAB — GLUCOSE, CAPILLARY
Glucose-Capillary: 114 mg/dL — ABNORMAL HIGH (ref 70–99)
Glucose-Capillary: 123 mg/dL — ABNORMAL HIGH (ref 70–99)
Glucose-Capillary: 102 mg/dL — ABNORMAL HIGH (ref 70–99)
Glucose-Capillary: 114 mg/dL — ABNORMAL HIGH (ref 70–99)
Glucose-Capillary: 97 mg/dL (ref 70–99)
Glucose-Capillary: 123 mg/dL — ABNORMAL HIGH (ref 70–99)
Glucose-Capillary: 118 mg/dL — ABNORMAL HIGH (ref 70–99)
Glucose-Capillary: 131 mg/dL — ABNORMAL HIGH (ref 70–99)
Glucose-Capillary: 115 mg/dL — ABNORMAL HIGH (ref 70–99)

## 2023-07-03 LAB — POCT I-STAT EG7
Acid-base deficit: 3 mmol/L — ABNORMAL HIGH (ref 0.0–2.0)
Bicarbonate: 23.5 mmol/L (ref 20.0–28.0)
Calcium, Ion: 1.07 mmol/L — ABNORMAL LOW (ref 1.15–1.40)
HCT: 31 % — ABNORMAL LOW (ref 39.0–52.0)
Hemoglobin: 10.5 g/dL — ABNORMAL LOW (ref 13.0–17.0)
O2 Saturation: 89 %
Potassium: 4.9 mmol/L (ref 3.5–5.1)
Sodium: 139 mmol/L (ref 135–145)
TCO2: 25 mmol/L (ref 22–32)
pCO2, Ven: 45.8 mmHg (ref 44–60)
pH, Ven: 7.319 (ref 7.25–7.43)
pO2, Ven: 61 mmHg — ABNORMAL HIGH (ref 32–45)

## 2023-07-03 LAB — BASIC METABOLIC PANEL WITH GFR
Anion gap: 8 (ref 5–15)
BUN: 15 mg/dL (ref 8–23)
CO2: 21 mmol/L — ABNORMAL LOW (ref 22–32)
Calcium: 7.4 mg/dL — ABNORMAL LOW (ref 8.9–10.3)
Chloride: 106 mmol/L (ref 98–111)
Creatinine, Ser: 1.03 mg/dL (ref 0.61–1.24)
GFR, Estimated: >60 mL/min (ref >60)
Glucose, Bld: 120 mg/dL — ABNORMAL HIGH (ref 70–99)
Potassium: 4.1 mmol/L (ref 3.5–5.1)
Sodium: 135 mmol/L (ref 135–145)

## 2023-07-03 LAB — APTT: aPTT: 36 s (ref 24–36)

## 2023-07-03 LAB — PLATELET COUNT: Platelets: 155 10*3/uL (ref 150–400)

## 2023-07-03 LAB — PROTIME-INR
INR: 1.4 — ABNORMAL HIGH (ref 0.8–1.2)
Prothrombin Time: 17.6 s — ABNORMAL HIGH (ref 11.4–15.2)

## 2023-07-03 LAB — ABO/RH: ABO/RH(D): AB POS

## 2023-07-03 LAB — MAGNESIUM: Magnesium: 2.9 mg/dL — ABNORMAL HIGH (ref 1.7–2.4)

## 2023-07-03 SURGERY — CORONARY ARTERY BYPASS GRAFTING (CABG)
Anesthesia: General | Site: Chest

## 2023-07-03 MED ORDER — ~~LOC~~ CARDIAC SURGERY, PATIENT & FAMILY EDUCATION
Freq: Once | Status: DC
  Filled 2023-07-03: qty 1

## 2023-07-03 MED ORDER — ACETAMINOPHEN 500 MG PO TABS
1000.0000 mg | ORAL_TABLET | Freq: Four times a day (QID) | ORAL | Status: DC
  Administered 2023-07-03 – 2023-07-08 (×17): 1000 mg via ORAL
  Filled 2023-07-03 (×17): qty 2

## 2023-07-03 MED ORDER — MORPHINE SULFATE (PF) 2 MG/ML IV SOLN
1.0000 mg | INTRAVENOUS | Status: DC | PRN
  Administered 2023-07-03 – 2023-07-04 (×5): 2 mg via INTRAVENOUS
  Filled 2023-07-03 (×5): qty 1

## 2023-07-03 MED ORDER — SODIUM CHLORIDE 0.9% FLUSH
3.0000 mL | INTRAVENOUS | Status: DC | PRN
  Administered 2023-07-05: 8 mL via INTRAVENOUS

## 2023-07-03 MED ORDER — FENTANYL CITRATE (PF) 250 MCG/5ML IJ SOLN
INTRAMUSCULAR | Status: AC
  Filled 2023-07-03: qty 5

## 2023-07-03 MED ORDER — CHLORHEXIDINE GLUCONATE CLOTH 2 % EX PADS
6.0000 | MEDICATED_PAD | Freq: Every day | CUTANEOUS | Status: DC
  Administered 2023-07-03 – 2023-07-05 (×3): 6 via TOPICAL

## 2023-07-03 MED ORDER — POTASSIUM CHLORIDE 10 MEQ/50ML IV SOLN
10.0000 meq | INTRAVENOUS | Status: AC
  Administered 2023-07-03 (×3): 10 meq via INTRAVENOUS

## 2023-07-03 MED ORDER — PHENYLEPHRINE HCL (PRESSORS) 10 MG/ML IV SOLN
INTRAVENOUS | Status: DC | PRN
  Administered 2023-07-03: 40 ug via INTRAVENOUS

## 2023-07-03 MED ORDER — TRAMADOL HCL 50 MG PO TABS
50.0000 mg | ORAL_TABLET | ORAL | Status: DC | PRN
  Administered 2023-07-04: 50 mg via ORAL
  Filled 2023-07-03: qty 1

## 2023-07-03 MED ORDER — EPHEDRINE 5 MG/ML INJ
INTRAVENOUS | Status: AC
  Filled 2023-07-03: qty 5

## 2023-07-03 MED ORDER — SODIUM CHLORIDE 0.45 % IV SOLN: INTRAVENOUS | Status: AC | PRN

## 2023-07-03 MED ORDER — DEXMEDETOMIDINE HCL IN NACL 400 MCG/100ML IV SOLN
0.0000 ug/kg/h | INTRAVENOUS | Status: DC
  Administered 2023-07-03: 0.7 ug/kg/h via INTRAVENOUS
  Filled 2023-07-03: qty 100

## 2023-07-03 MED ORDER — ASPIRIN 81 MG PO CHEW
324.0000 mg | CHEWABLE_TABLET | Freq: Once | ORAL | Status: AC
  Administered 2023-07-03: 324 mg via ORAL
  Filled 2023-07-03: qty 4

## 2023-07-03 MED ORDER — ALBUMIN HUMAN 5 % IV SOLN: INTRAVENOUS | Status: DC | PRN

## 2023-07-03 MED ORDER — NITROGLYCERIN IN D5W 200-5 MCG/ML-% IV SOLN
0.0000 ug/min | INTRAVENOUS | Status: DC
  Filled 2023-07-03: qty 250

## 2023-07-03 MED ORDER — EPHEDRINE SULFATE-NACL 50-0.9 MG/10ML-% IV SOSY
PREFILLED_SYRINGE | INTRAVENOUS | Status: DC | PRN
Start: 2023-07-03 — End: 2023-07-03
  Administered 2023-07-03 (×2): 2.5 mg via INTRAVENOUS

## 2023-07-03 MED ORDER — ROCURONIUM BROMIDE 10 MG/ML (PF) SYRINGE
PREFILLED_SYRINGE | INTRAVENOUS | Status: AC
  Filled 2023-07-03: qty 20

## 2023-07-03 MED ORDER — MIDAZOLAM HCL (PF) 5 MG/ML IJ SOLN
INTRAMUSCULAR | Status: DC | PRN
  Administered 2023-07-03 (×3): 2 mg via INTRAVENOUS

## 2023-07-03 MED ORDER — PROPOFOL 10 MG/ML IV BOLUS
INTRAVENOUS | Status: DC | PRN
  Administered 2023-07-03: 20 mg via INTRAVENOUS
  Administered 2023-07-03: 110 mg via INTRAVENOUS

## 2023-07-03 MED ORDER — SODIUM CHLORIDE 0.9 % IV SOLN
250.0000 mL | INTRAVENOUS | Status: AC
  Administered 2023-07-04: 250 mL via INTRAVENOUS

## 2023-07-03 MED ORDER — PLASMA-LYTE A IV SOLN: INTRAVENOUS | Status: DC | PRN

## 2023-07-03 MED ORDER — FENTANYL CITRATE (PF) 100 MCG/2ML IJ SOLN
INTRAMUSCULAR | Status: DC | PRN
Start: 2023-07-03 — End: 2023-07-03
  Administered 2023-07-03 (×2): 50 ug via INTRAVENOUS
  Administered 2023-07-03 (×3): 100 ug via INTRAVENOUS
  Administered 2023-07-03 (×4): 50 ug via INTRAVENOUS

## 2023-07-03 MED ORDER — HEMOSTATIC AGENTS (NO CHARGE) OPTIME
TOPICAL | Status: DC | PRN
  Administered 2023-07-03: 1 via TOPICAL

## 2023-07-03 MED ORDER — SODIUM CHLORIDE 0.9 % IV SOLN: INTRAVENOUS | Status: AC

## 2023-07-03 MED ORDER — ROCURONIUM BROMIDE 100 MG/10ML IV SOLN
INTRAVENOUS | Status: DC | PRN
  Administered 2023-07-03: 20 mg via INTRAVENOUS
  Administered 2023-07-03: 80 mg via INTRAVENOUS
  Administered 2023-07-03: 40 mg via INTRAVENOUS
  Administered 2023-07-03: 20 mg via INTRAVENOUS

## 2023-07-03 MED ORDER — PROTAMINE SULFATE 10 MG/ML IV SOLN
INTRAVENOUS | Status: AC
  Filled 2023-07-03: qty 25

## 2023-07-03 MED ORDER — LACTATED RINGERS IV SOLN: INTRAVENOUS | Status: DC | PRN

## 2023-07-03 MED ORDER — ONDANSETRON HCL 4 MG/2ML IJ SOLN
4.0000 mg | Freq: Four times a day (QID) | INTRAMUSCULAR | Status: DC | PRN
  Administered 2023-07-03 – 2023-07-04 (×4): 4 mg via INTRAVENOUS
  Filled 2023-07-03 (×4): qty 2

## 2023-07-03 MED ORDER — DOCUSATE SODIUM 100 MG PO CAPS
200.0000 mg | ORAL_CAPSULE | Freq: Every day | ORAL | Status: DC
  Administered 2023-07-04 – 2023-07-05 (×2): 200 mg via ORAL
  Filled 2023-07-03 (×2): qty 2

## 2023-07-03 MED ORDER — INSULIN REGULAR(HUMAN) IN NACL 100-0.9 UT/100ML-% IV SOLN: INTRAVENOUS | Status: DC

## 2023-07-03 MED ORDER — THYROID 60 MG PO TABS
60.0000 mg | ORAL_TABLET | ORAL | Status: DC
  Administered 2023-07-04 – 2023-07-08 (×4): 60 mg via ORAL
  Filled 2023-07-03 (×4): qty 1

## 2023-07-03 MED ORDER — PANTOPRAZOLE SODIUM 40 MG PO TBEC
40.0000 mg | DELAYED_RELEASE_TABLET | Freq: Every day | ORAL | Status: DC
  Administered 2023-07-05: 40 mg via ORAL
  Filled 2023-07-03: qty 1

## 2023-07-03 MED ORDER — ASPIRIN 81 MG PO CHEW: 324.0000 mg | CHEWABLE_TABLET | Freq: Every day | ORAL | Status: DC

## 2023-07-03 MED ORDER — PHENYLEPHRINE HCL-NACL 20-0.9 MG/250ML-% IV SOLN
0.0000 ug/min | INTRAVENOUS | Status: DC
  Administered 2023-07-04: 10 ug/min via INTRAVENOUS
  Filled 2023-07-03: qty 250

## 2023-07-03 MED ORDER — SODIUM CHLORIDE (PF) 0.9 % IJ SOLN
INTRAMUSCULAR | Status: AC
  Filled 2023-07-03: qty 10

## 2023-07-03 MED ORDER — MIDAZOLAM HCL (PF) 10 MG/2ML IJ SOLN
INTRAMUSCULAR | Status: AC
  Filled 2023-07-03: qty 2

## 2023-07-03 MED ORDER — PHENYLEPHRINE 80 MCG/ML (10ML) SYRINGE FOR IV PUSH (FOR BLOOD PRESSURE SUPPORT)
PREFILLED_SYRINGE | INTRAVENOUS | Status: AC
  Filled 2023-07-03: qty 10

## 2023-07-03 MED ORDER — METOPROLOL TARTRATE 12.5 MG HALF TABLET
12.5000 mg | ORAL_TABLET | Freq: Once | ORAL | Status: AC
  Administered 2023-07-03: 12.5 mg via ORAL
  Filled 2023-07-03: qty 1

## 2023-07-03 MED ORDER — CHLORHEXIDINE GLUCONATE 4 % EX SOLN
30.0000 mL | CUTANEOUS | Status: DC
Start: 2023-07-03 — End: 2023-07-03

## 2023-07-03 MED ORDER — PANTOPRAZOLE SODIUM 40 MG IV SOLR
40.0000 mg | Freq: Every day | INTRAVENOUS | Status: AC
  Administered 2023-07-03 – 2023-07-04 (×2): 40 mg via INTRAVENOUS
  Filled 2023-07-03 (×2): qty 10

## 2023-07-03 MED ORDER — ACETAMINOPHEN 160 MG/5ML PO SOLN: 1000.0000 mg | Freq: Four times a day (QID) | ORAL | Status: DC

## 2023-07-03 MED ORDER — CEFAZOLIN SODIUM-DEXTROSE 2-4 GM/100ML-% IV SOLN
2.0000 g | Freq: Three times a day (TID) | INTRAVENOUS | Status: AC
  Administered 2023-07-03 – 2023-07-05 (×6): 2 g via INTRAVENOUS
  Filled 2023-07-03 (×6): qty 100

## 2023-07-03 MED ORDER — METOPROLOL TARTRATE 12.5 MG HALF TABLET: 12.5000 mg | ORAL_TABLET | Freq: Two times a day (BID) | ORAL | Status: DC

## 2023-07-03 MED ORDER — SODIUM CHLORIDE 0.9 % IV SOLN: INTRAVENOUS | Status: DC | PRN

## 2023-07-03 MED ORDER — ALBUMIN HUMAN 5 % IV SOLN
250.0000 mL | INTRAVENOUS | Status: AC | PRN
  Administered 2023-07-03 (×5): 12.5 g via INTRAVENOUS
  Filled 2023-07-03 (×4): qty 250

## 2023-07-03 MED ORDER — CHLORHEXIDINE GLUCONATE 0.12 % MT SOLN
15.0000 mL | OROMUCOSAL | Status: AC
  Administered 2023-07-03: 15 mL via OROMUCOSAL
  Filled 2023-07-03: qty 15

## 2023-07-03 MED ORDER — METOPROLOL TARTRATE 25 MG/10 ML ORAL SUSPENSION: 12.5000 mg | Freq: Two times a day (BID) | ORAL | Status: DC

## 2023-07-03 MED ORDER — SODIUM BICARBONATE 8.4 % IV SOLN
50.0000 meq | Freq: Once | INTRAVENOUS | Status: AC
  Administered 2023-07-03: 50 meq via INTRAVENOUS

## 2023-07-03 MED ORDER — METOPROLOL TARTRATE 5 MG/5ML IV SOLN: 2.5000 mg | INTRAVENOUS | Status: DC | PRN

## 2023-07-03 MED ORDER — ASPIRIN 325 MG PO TBEC: 325.0000 mg | DELAYED_RELEASE_TABLET | Freq: Every day | ORAL | Status: DC

## 2023-07-03 MED ORDER — MIDAZOLAM HCL 2 MG/2ML IJ SOLN
2.0000 mg | INTRAMUSCULAR | Status: DC | PRN
  Administered 2023-07-03: 2 mg via INTRAVENOUS
  Filled 2023-07-03: qty 2

## 2023-07-03 MED ORDER — PROTAMINE SULFATE 10 MG/ML IV SOLN
INTRAVENOUS | Status: DC | PRN
Start: 2023-07-03 — End: 2023-07-03
  Administered 2023-07-03: 250 mg via INTRAVENOUS

## 2023-07-03 MED ORDER — THROMBIN 20000 UNITS EX SOLR: OROMUCOSAL | Status: DC | PRN

## 2023-07-03 MED ORDER — SODIUM CHLORIDE 0.9% FLUSH
10.0000 mL | Freq: Two times a day (BID) | INTRAVENOUS | Status: DC
  Administered 2023-07-03 – 2023-07-06 (×6): 10 mL

## 2023-07-03 MED ORDER — ROSUVASTATIN CALCIUM 5 MG PO TABS
10.0000 mg | ORAL_TABLET | Freq: Every evening | ORAL | Status: DC
  Administered 2023-07-04 – 2023-07-07 (×4): 10 mg via ORAL
  Filled 2023-07-03 (×4): qty 2

## 2023-07-03 MED ORDER — THROMBIN (RECOMBINANT) 20000 UNITS EX SOLR
CUTANEOUS | Status: AC
  Filled 2023-07-03: qty 20000

## 2023-07-03 MED ORDER — SODIUM CHLORIDE 0.9% FLUSH
3.0000 mL | Freq: Two times a day (BID) | INTRAVENOUS | Status: DC
  Administered 2023-07-04: 10 mL via INTRAVENOUS
  Administered 2023-07-04 – 2023-07-06 (×3): 3 mL via INTRAVENOUS

## 2023-07-03 MED ORDER — DULOXETINE HCL 60 MG PO CPEP
60.0000 mg | ORAL_CAPSULE | Freq: Every morning | ORAL | Status: DC
  Administered 2023-07-04 – 2023-07-08 (×5): 60 mg via ORAL
  Filled 2023-07-03 (×5): qty 1

## 2023-07-03 MED ORDER — 0.9 % SODIUM CHLORIDE (POUR BTL) OPTIME
TOPICAL | Status: DC | PRN
  Administered 2023-07-03: 5000 mL

## 2023-07-03 MED ORDER — OXYCODONE HCL 5 MG PO TABS
5.0000 mg | ORAL_TABLET | ORAL | Status: DC | PRN
  Administered 2023-07-03: 5 mg via ORAL
  Administered 2023-07-04: 10 mg via ORAL
  Administered 2023-07-04 (×3): 5 mg via ORAL
  Filled 2023-07-03 (×2): qty 1
  Filled 2023-07-03: qty 2
  Filled 2023-07-03 (×2): qty 1

## 2023-07-03 MED ORDER — ACETAMINOPHEN 160 MG/5ML PO SOLN
650.0000 mg | Freq: Once | ORAL | Status: AC
  Administered 2023-07-03: 650 mg
  Filled 2023-07-03: qty 20.3

## 2023-07-03 MED ORDER — HEPARIN SODIUM (PORCINE) 1000 UNIT/ML IJ SOLN
INTRAMUSCULAR | Status: AC
  Filled 2023-07-03: qty 1

## 2023-07-03 MED ORDER — LACTATED RINGERS IV SOLN: INTRAVENOUS | Status: AC

## 2023-07-03 MED ORDER — HEPARIN SODIUM (PORCINE) 1000 UNIT/ML IJ SOLN
INTRAMUSCULAR | Status: DC | PRN
  Administered 2023-07-03: 27000 [IU] via INTRAVENOUS

## 2023-07-03 MED ORDER — CHLORHEXIDINE GLUCONATE 0.12 % MT SOLN
15.0000 mL | Freq: Once | OROMUCOSAL | Status: AC
Start: 2023-07-04 — End: 2023-07-03
  Administered 2023-07-03: 15 mL via OROMUCOSAL
  Filled 2023-07-03: qty 15

## 2023-07-03 MED ORDER — METOCLOPRAMIDE HCL 5 MG/ML IJ SOLN
10.0000 mg | Freq: Four times a day (QID) | INTRAMUSCULAR | Status: AC
  Administered 2023-07-03 – 2023-07-04 (×6): 10 mg via INTRAVENOUS
  Filled 2023-07-03 (×6): qty 2

## 2023-07-03 MED ORDER — BISACODYL 5 MG PO TBEC
10.0000 mg | DELAYED_RELEASE_TABLET | Freq: Every day | ORAL | Status: DC
  Administered 2023-07-04: 10 mg via ORAL
  Filled 2023-07-03: qty 2

## 2023-07-03 MED ORDER — BISACODYL 10 MG RE SUPP: 10.0000 mg | Freq: Every day | RECTAL | Status: DC

## 2023-07-03 MED ORDER — VANCOMYCIN HCL IN DEXTROSE 1-5 GM/200ML-% IV SOLN
1000.0000 mg | Freq: Once | INTRAVENOUS | Status: AC
  Administered 2023-07-03: 1000 mg via INTRAVENOUS
  Filled 2023-07-03: qty 200

## 2023-07-03 MED ORDER — SODIUM CHLORIDE 0.9% FLUSH: 10.0000 mL | INTRAVENOUS | Status: DC | PRN

## 2023-07-03 MED ORDER — CALCIUM CHLORIDE 10 % IV SOLN
INTRAVENOUS | Status: DC | PRN
  Administered 2023-07-03: 250 mg via INTRAVENOUS

## 2023-07-03 MED ORDER — MAGNESIUM SULFATE 4 GM/100ML IV SOLN
4.0000 g | Freq: Once | INTRAVENOUS | Status: AC
  Administered 2023-07-03: 4 g via INTRAVENOUS
  Filled 2023-07-03: qty 100

## 2023-07-03 MED ORDER — ORAL CARE MOUTH RINSE: 15.0000 mL | OROMUCOSAL | Status: DC | PRN

## 2023-07-03 MED ORDER — PROPOFOL 10 MG/ML IV BOLUS
INTRAVENOUS | Status: AC
  Filled 2023-07-03: qty 20

## 2023-07-03 MED ORDER — THYROID 60 MG PO TABS
120.0000 mg | ORAL_TABLET | ORAL | Status: DC
  Administered 2023-07-05: 120 mg via ORAL
  Filled 2023-07-03: qty 2

## 2023-07-03 MED ORDER — THYROID 60 MG PO TABS: 60.0000 mg | ORAL_TABLET | ORAL | Status: DC

## 2023-07-03 MED ORDER — DEXTROSE 50 % IV SOLN: 0.0000 mL | INTRAVENOUS | Status: DC | PRN

## 2023-07-03 SURGICAL SUPPLY — 80 items
BAG DECANTER FOR FLEXI CONT (MISCELLANEOUS) ×2 IMPLANT
BLADE CLIPPER SURG (BLADE) ×2 IMPLANT
BLADE STERNUM SYSTEM 6 (BLADE) ×2 IMPLANT
BLADE SURG 11 STRL SS (BLADE) IMPLANT
BNDG ELASTIC 4INX 5YD STR LF (GAUZE/BANDAGES/DRESSINGS) IMPLANT
BNDG ELASTIC 4X5.8 VLCR STR LF (GAUZE/BANDAGES/DRESSINGS) ×2 IMPLANT
BNDG ELASTIC 6INX 5YD STR LF (GAUZE/BANDAGES/DRESSINGS) ×2 IMPLANT
BNDG GAUZE DERMACEA FLUFF 4 (GAUZE/BANDAGES/DRESSINGS) ×2 IMPLANT
CANISTER SUCT 3000ML PPV (MISCELLANEOUS) ×2 IMPLANT
CANNULA ARTERIAL VENT 3/8 20FR (CANNULA) IMPLANT
CANNULA MC2 2 STG 36/46 NON-V (CANNULA) IMPLANT
CATH ROBINSON RED A/P 18FR (CATHETERS) ×4 IMPLANT
CATH THORACIC 28FR (CATHETERS) ×2 IMPLANT
CATH THORACIC 36FR (CATHETERS) ×2 IMPLANT
CATH THORACIC 36FR RT ANG (CATHETERS) ×2 IMPLANT
CLIP TI WIDE RED SMALL 24 (CLIP) IMPLANT
CONTAINER PROTECT SURGISLUSH (MISCELLANEOUS) ×4 IMPLANT
DERMABOND ADVANCED .7 DNX6 (GAUZE/BANDAGES/DRESSINGS) IMPLANT
DRAPE SRG 135X102X78XABS (DRAPES) ×2 IMPLANT
DRAPE WARM FLUID 44X44 (DRAPES) ×2 IMPLANT
DRSG COVADERM 4X14 (GAUZE/BANDAGES/DRESSINGS) ×2 IMPLANT
ELECT CAUTERY BLADE 6.4 (BLADE) ×2 IMPLANT
ELECTRODE BLDE 4.0 EZ CLN MEGD (MISCELLANEOUS) IMPLANT
ELECTRODE REM PT RTRN 9FT ADLT (ELECTROSURGICAL) ×4 IMPLANT
FELT TEFLON 1X6 (MISCELLANEOUS) ×2 IMPLANT
GAUZE SPONGE 4X4 12PLY STRL (GAUZE/BANDAGES/DRESSINGS) ×4 IMPLANT
GAUZE SPONGE 4X4 12PLY STRL LF (GAUZE/BANDAGES/DRESSINGS) IMPLANT
GLOVE BIO SURGEON STRL SZ 6 (GLOVE) IMPLANT
GLOVE BIO SURGEON STRL SZ 6.5 (GLOVE) IMPLANT
GLOVE BIOGEL PI IND STRL 6.5 (GLOVE) IMPLANT
GLOVE BIOGEL PI IND STRL 7.0 (GLOVE) IMPLANT
GLOVE ECLIPSE 7.0 STRL STRAW (GLOVE) ×4 IMPLANT
GLOVE ORTHO TXT STRL SZ7.5 (GLOVE) IMPLANT
GOWN STRL REUS W/ TWL LRG LVL3 (GOWN DISPOSABLE) ×8 IMPLANT
GOWN STRL REUS W/ TWL XL LVL3 (GOWN DISPOSABLE) ×2 IMPLANT
HEMOSTAT POWDER SURGIFOAM 1G (HEMOSTASIS) ×6 IMPLANT
HEMOSTAT SURGICEL 2X14 (HEMOSTASIS) ×2 IMPLANT
KIT BASIN OR (CUSTOM PROCEDURE TRAY) ×2 IMPLANT
KIT SUCTION CATH 14FR (SUCTIONS) ×2 IMPLANT
KIT TURNOVER KIT B (KITS) ×2 IMPLANT
KIT VASOVIEW HEMOPRO 2 VH 4000 (KITS) ×2 IMPLANT
KNIFE MICRO-UNI 3.5 30 DEG (BLADE) ×2 IMPLANT
NS IRRIG 1000ML POUR BTL (IV SOLUTION) ×10 IMPLANT
PACK E OPEN HEART (SUTURE) ×2 IMPLANT
PACK OPEN HEART (CUSTOM PROCEDURE TRAY) ×2 IMPLANT
PAD ARMBOARD POSITIONER FOAM (MISCELLANEOUS) ×4 IMPLANT
PAD ELECT DEFIB RADIOL ZOLL (MISCELLANEOUS) ×2 IMPLANT
PENCIL BUTTON HOLSTER BLD 10FT (ELECTRODE) ×2 IMPLANT
POSITIONER HEAD DONUT 9IN (MISCELLANEOUS) ×2 IMPLANT
PUNCH AORTIC ROTATE 4.5MM 8IN (MISCELLANEOUS) ×2 IMPLANT
SET MPS 3-ND DEL (MISCELLANEOUS) IMPLANT
SOLUTION ANTFG W/FOAM PAD STRL (MISCELLANEOUS) IMPLANT
STOPCOCK 4 WAY LG BORE MALE ST (IV SETS) IMPLANT
SUPPORT HEART JANKE-BARRON (MISCELLANEOUS) ×2 IMPLANT
SUT BONE WAX W31G (SUTURE) ×2 IMPLANT
SUT MNCRL AB 4-0 PS2 18 (SUTURE) IMPLANT
SUT PROLENE 3 0 SH1 36 (SUTURE) ×2 IMPLANT
SUT PROLENE 4 0 SH DA (SUTURE) IMPLANT
SUT PROLENE 4-0 RB1 .5 CRCL 36 (SUTURE) IMPLANT
SUT PROLENE 5 0 C 1 36 (SUTURE) IMPLANT
SUT PROLENE 6 0 C 1 30 (SUTURE) IMPLANT
SUT PROLENE 7 0 BV 1 (SUTURE) IMPLANT
SUT PROLENE 7 0 BV1 MDA (SUTURE) ×2 IMPLANT
SUT PROLENE 8 0 BV175 6 (SUTURE) IMPLANT
SUT SILK 1 MH (SUTURE) IMPLANT
SUT SILK 2 0 SH CR/8 (SUTURE) IMPLANT
SUT STEEL 6MS V (SUTURE) IMPLANT
SUT STEEL STERNAL CCS#1 18IN (SUTURE) IMPLANT
SUT STEEL SZ 6 DBL 3X14 BALL (SUTURE) IMPLANT
SUT VIC AB 1 CTX36XBRD ANBCTR (SUTURE) ×4 IMPLANT
SUT VIC AB 2-0 CT1 TAPERPNT 27 (SUTURE) IMPLANT
SYSTEM SAHARA CHEST DRAIN ATS (WOUND CARE) ×2 IMPLANT
TAPE CLOTH SURG 4X10 WHT LF (GAUZE/BANDAGES/DRESSINGS) IMPLANT
TAPE PAPER 2X10 WHT MICROPORE (GAUZE/BANDAGES/DRESSINGS) IMPLANT
TOWEL GREEN STERILE (TOWEL DISPOSABLE) ×2 IMPLANT
TOWEL GREEN STERILE FF (TOWEL DISPOSABLE) ×2 IMPLANT
TRAY FOLEY SLVR 16FR TEMP STAT (SET/KITS/TRAYS/PACK) ×2 IMPLANT
TUBING LAP HI FLOW INSUFFLATIO (TUBING) ×2 IMPLANT
UNDERPAD 30X36 HEAVY ABSORB (UNDERPADS AND DIAPERS) ×2 IMPLANT
WATER STERILE IRR 1000ML POUR (IV SOLUTION) ×4 IMPLANT

## 2023-07-03 NOTE — TOC Initial Note (Signed)
 Transition of Care [facility]) - Initial/Assessment Note    Patient Details  Name: Paul Ryan MRN: [id number] Date of Birth: 02/26/1946  Transition of Care [facility]) CM/SW Contact:    Benjiman Bras, RN Phone Number: [phone] 07/03/2023, 3:48 PM  Clinical Narrative:    s/p CABG 07/03/2023            TOC CM spoke to pt's wife, Paul Ryan to introduce CM. Pt was independent pta. Explained TOCCM/CSW will continue to follow for dc needs.     Expected Discharge Plan: Home w Home Health Services Barriers to Discharge: Continued Medical Work up   Patient Goals and CMS Choice            Expected Discharge Plan and Services                                              Prior Living Arrangements/Services                       Activities of Daily Living      Permission Sought/Granted                  Emotional Assessment   Attitude/Demeanor/Rapport: Intubated (Following Commands or Not Following Commands)          Admission diagnosis:  Coronary artery disease involving native coronary artery of native heart without angina pectoris [I25.10] S/P CABG x 3 [Z95.1] Patient Active Problem List   Diagnosis Date Noted   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   CAD (coronary artery disease) 06/23/2023   B12 deficiency 12/11/2022   Patellofemoral arthritis of right knee 10/14/2022   De Quervain's tenosynovitis, left 09/01/2022   Hordeolum externum of right lower eyelid 06/11/2022   Type 2 diabetes mellitus with hyperlipidemia ([name]) 04/20/2022   BPH (benign prostatic hyperplasia) 09/30/2020   Neck pain 09/30/2020   Essential tremor 08/18/2020   Cervical radiculopathy 12/06/2019   Idiopathic peripheral neuropathy 11/29/2019   Olecranon bursitis of right elbow 05/19/2019   Flank pain 11/05/2017   Erectile dysfunction 12/28/2016   Anxiety 12/01/2016   MCI (mild cognitive impairment) 07/22/2016   Insomnia 04/24/2016   Urinary frequency 04/24/2016   Acid reflux 02/03/2016    Degenerative arthritis of finger, 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   Dyspnea 12/29/2014   Hyperglycemia 12/26/2014   Pulmonary embolism ([name]) 08/07/2014   Polymyalgia rheumatica ([name]) 04/25/2014   Chronic anticoagulation 06/14/2013   Leukocytosis 05/29/2013   Greater trochanteric bursitis of right hip 05/16/2013   Vitamin D  deficiency 10/14/2011   Hypothyroid 06/10/2011   Hereditary protein S deficiency ([name]) 05/16/2011   Left hip pain 12/17/2010   Encounter for counseling 12/17/2010   DVT (deep venous thrombosis) ([name])    Allergy    Hyperlipidemia    PCP:  Neda Balk, MD Pharmacy:   [facility] [address] [place], [place] - [address] [place] [address] Phone: [phone] Fax: [phone]     Social Drivers of Health (SDOH) Social History: SDOH Screenings   Food Insecurity: No Food Insecurity (03/03/2023)  Housing: Unknown (03/03/2023)  Transportation Needs: No Transportation Needs (03/03/2023)  Utilities: Not At Risk (03/03/2023)  Alcohol Screen: Low Risk  (03/03/2023)  Depression (PHQ2-9): Low Risk  (03/03/2023)  Financial Resource Strain: Low Risk  (03/03/2023)  Physical Activity: Sufficiently Active (  03/03/2023)  Social Connections: Moderately Isolated (03/03/2023)  Stress: No Stress Concern Present (03/03/2023)  Tobacco Use: Medium Risk (07/03/2023)  Health Literacy: Adequate Health Literacy (03/03/2023)   SDOH Interventions:     Readmission Risk Interventions     No data to display

## 2023-07-03 NOTE — Transfer of Care (Signed)
 Immediate Anesthesia Transfer of Care Note  Patient: Paul Ryan  Procedure(s) Performed: CORONARY ARTERY BYPASS GRAFTING X 3, USING LEFT INTERNAL MAMMARY ARTERY AND ENDOSCOPIC VEIN HARVEST RIGHT GREATER SAPHENOUS VEIN (Chest) ECHOCARDIOGRAM, TRANSESOPHAGEAL, INTRAOPERATIVE  Patient Location: ICU  Anesthesia Type:General  Level of Consciousness: sedated and Patient remains intubated per anesthesia plan  Airway & Oxygen Therapy: Patient remains intubated per anesthesia plan and Patient placed on Ventilator (see vital sign flow sheet for setting)  Post-op Assessment: Report given to RN and Post -op Vital signs reviewed and stable  Post vital signs: Reviewed and stable  Last Vitals:  Vitals Value Taken Time  BP 103/56   Temp    Pulse 80   Resp 16   SpO2 100     Last Pain:  Vitals:   07/03/23 0621  TempSrc:   PainSc: 0-No pain      Patients Stated Pain Goal: 0 (07/03/23 1610)  Complications: No notable events documented.  Patient transported to ICU with standard monitors (HR, BP, SPO2, RR) and emergency drugs/equipment. Controlled ventilation maintained via ambu bag. Report given to bedside RN and respiratory therapist. Pt connected to ICU monitor and ventilator. All questions answered and vital signs stable before leaving

## 2023-07-03 NOTE — Anesthesia Procedure Notes (Signed)
 Central Venous Catheter Insertion Performed by: Willian Harrow, MD, anesthesiologist Start/End5/04/2023 7:00 AM, 07/03/2023 7:10 AM Patient location: Pre-op. Preanesthetic checklist: patient identified, IV checked, site marked, risks and benefits discussed, surgical consent, monitors and equipment checked, pre-op evaluation, timeout performed and anesthesia consent Lidocaine  1% used for infiltration and patient sedated Hand hygiene performed  and maximum sterile barriers used  Catheter size: 8.5 Fr Sheath introducer Procedure performed using ultrasound guided technique. Ultrasound Notes:anatomy identified, needle tip was noted to be adjacent to the nerve/plexus identified, no ultrasound evidence of intravascular and/or intraneural injection and image(s) printed for medical record Attempts: 1 Following insertion, line sutured, dressing applied and Biopatch. Post procedure assessment: blood return through all ports, free fluid flow and no air  Patient tolerated the procedure well with no immediate complications.

## 2023-07-03 NOTE — Anesthesia Procedure Notes (Signed)
 Procedure Name: Intubation Date/Time: 07/03/2023 7:39 AM  Performed by: Rochelle Chu, CRNAPre-anesthesia Checklist: Patient identified, Emergency Drugs available, Suction available and Patient being monitored Patient Re-evaluated:Patient Re-evaluated prior to induction Oxygen Delivery Method: Circle system utilized Preoxygenation: Pre-oxygenation with 100% oxygen Induction Type: IV induction Ventilation: Mask ventilation without difficulty Laryngoscope Size: 4 and Mac Grade View: Grade I Tube type: Oral Tube size: 8.0 mm Number of attempts: 1 Airway Equipment and Method: Stylet and Oral airway Placement Confirmation: ETT inserted through vocal cords under direct vision, positive ETCO2 and breath sounds checked- equal and bilateral Secured at: 23 cm Tube secured with: Tape Dental Injury: Teeth and Oropharynx as per pre-operative assessment

## 2023-07-03 NOTE — Procedures (Signed)
 Extubation Procedure Note  Patient Details:   Name: Bravin Helman DOB: 05-Jun-1945 MRN: [id number]   Airway Documentation:    Vent end date: 07/03/23 Vent end time: 1630   Evaluation  O2 sats: stable throughout Complications: No apparent complications Patient did tolerate procedure well. Bilateral Breath Sounds: Clear   Yes  NIF-25 1.2L VC  Positive cuff leak prior. Pt on 4L [place]  Bailey Bolus 07/03/2023, 4:34 PM

## 2023-07-03 NOTE — Interval H&P Note (Signed)
 History and Physical Interval Note:  07/03/2023 6:30 AM  Paul Ryan  has presented today for surgery, with the diagnosis of CAD.  The various methods of treatment have been discussed with the patient and family. After consideration of risks, benefits and other options for treatment, the patient has consented to  Procedure(s): CORONARY ARTERY BYPASS GRAFTING (CABG) (N/A) ECHOCARDIOGRAM, TRANSESOPHAGEAL, INTRAOPERATIVE (N/A) as a surgical intervention.  The patient's history has been reviewed, patient examined, no change in status, stable for surgery.  I have reviewed the patient's chart and labs.  Questions were answered to the patient's satisfaction.     [name] K Cletis Muma

## 2023-07-03 NOTE — Anesthesia Procedure Notes (Signed)
 Arterial Line Insertion Start/End5/04/2023 7:11 AM Performed by: Rochelle Chu, CRNA, CRNA  Patient location: Pre-op. Preanesthetic checklist: patient identified, IV checked, site marked, risks and benefits discussed, surgical consent, monitors and equipment checked, pre-op evaluation, timeout performed and anesthesia consent Lidocaine  1% used for infiltration Left, radial was placed Catheter size: 20 G Hand hygiene performed  and maximum sterile barriers used   Attempts: 1 Procedure performed using ultrasound guided technique. Following insertion, dressing applied and Biopatch. Post procedure assessment: normal and unchanged  Patient tolerated the procedure well with no immediate complications.

## 2023-07-03 NOTE — Progress Notes (Signed)
 Patient ID: Paul Ryan, male   DOB: [birth year], 78 y.o.   MRN: [id number]   TCTS Evening Rounds:   Hemodynamically stable on neo. DDD paced 80, sinus brady underneath with long PR interval.  CI = 2.3  Extubated and alert.  Urine output good  CT output low  CBC    Component Value Date/Time   WBC 14.8 (H) 07/03/2023 1305   RBC 3.71 (L) 07/03/2023 1305   HGB 10.8 (L) 07/03/2023 1305   HGB 15.3 06/04/2023 1429   HGB 15.2 05/17/2013 0902   HCT 33.1 (L) 07/03/2023 1305   HCT 45.6 06/04/2023 1429   HCT 44.6 05/17/2013 0902   PLT 138 (L) 07/03/2023 1305   PLT 257 06/04/2023 1429   MCV 89.2 07/03/2023 1305   MCV 88 06/04/2023 1429   MCV 89.4 05/17/2013 0902   MCH 29.1 07/03/2023 1305   MCHC 32.6 07/03/2023 1305   RDW 13.4 07/03/2023 1305   RDW 13.4 06/04/2023 1429   RDW 13.7 05/17/2013 0902   LYMPHSABS 1.9 06/22/2023 0939   LYMPHSABS 2.2 06/10/2016 1033   LYMPHSABS 1.7 05/17/2013 0902   MONOABS 0.6 06/22/2023 0939   MONOABS 0.4 05/17/2013 0902   EOSABS 0.1 06/22/2023 0939   EOSABS 0.2 06/10/2016 1033   BASOSABS 0.0 06/22/2023 0939   BASOSABS 0.0 06/10/2016 1033   BASOSABS 0.0 05/17/2013 0902     BMET    Component Value Date/Time   NA 141 07/03/2023 1251   NA 143 06/04/2023 1429   NA 143 11/16/2012 1145   K 3.8 07/03/2023 1251   K 4.4 11/16/2012 1145   CL 104 07/03/2023 1137   CO2 25 07/01/2023 0827   CO2 27 11/16/2012 1145   GLUCOSE 120 (H) 07/03/2023 1137   GLUCOSE 99 11/16/2012 1145   BUN 19 07/03/2023 1137   BUN 16 06/04/2023 1429   BUN 16.2 11/16/2012 1145   CREATININE 0.90 07/03/2023 1137   CREATININE 0.90 03/21/2014 1521   CREATININE 1.1 11/16/2012 1145   CALCIUM  9.4 07/01/2023 0827   CALCIUM  9.3 11/16/2012 1145   EGFR 65 06/04/2023 1429   GFRNONAA >60 07/01/2023 0827     A/P:  Stable postop course. Continue current plans

## 2023-07-03 NOTE — Brief Op Note (Addendum)
 07/03/2023  2:41 PM  PATIENT:  Paul Ryan  78 y.o. male  PRE-OPERATIVE DIAGNOSIS:  CORONARY ARTERY DISEASE  POST-OPERATIVE DIAGNOSIS:  CORONARY ARTERY DISEASE  PROCEDURE:   CORONARY ARTERY BYPASS GRAFTING X 3, USING LEFT INTERNAL MAMMARY ARTERY AND ENDOSCOPIC VEIN HARVEST RIGHT GREATER SAPHENOUS VEIN  ECHOCARDIOGRAM, TRANSESOPHAGEAL, INTRAOPERATIVE  Vein harvest time: Vein prep time: -LIMA to LAD -SVG to Diagonal -SVG to RCA  SURGEON:  Surgeons and Role:    * Bartley Lightning, MD - Primary  PHYSICIAN ASSISTANT: Debroah Fanning PA-C  ASSISTANTS: Nova Began RNFA   ANESTHESIA:   general  EBL:  675 mL   BLOOD ADMINISTERED:none  DRAINS:  Mediastinal and pleural drains    LOCAL MEDICATIONS USED:  NONE  SPECIMEN:  No Specimen  DISPOSITION OF SPECIMEN:  N/A  COUNTS:  YES  DICTATION: .Dragon Dictation  PLAN OF CARE: Admit to inpatient   PATIENT DISPOSITION:  ICU - intubated and hemodynamically stable.   Delay start of Pharmacological VTE agent (>24hrs) due to surgical blood loss or risk of bleeding: yes

## 2023-07-03 NOTE — Anesthesia Procedure Notes (Signed)
 Central Venous Catheter Insertion Performed by: Willian Harrow, MD, anesthesiologist Start/End5/04/2023 7:10 AM, 07/03/2023 7:15 AM Patient location: Pre-op. Preanesthetic checklist: patient identified, IV checked, site marked, risks and benefits discussed, surgical consent, monitors and equipment checked, pre-op evaluation, timeout performed and anesthesia consent Hand hygiene performed  and maximum sterile barriers used  PA cath was placed.Swan type:thermodilution Procedure performed without using ultrasound guided technique. Attempts: 1 Patient tolerated the procedure well with no immediate complications.

## 2023-07-03 NOTE — Op Note (Signed)
 CARDIOVASCULAR SURGERY OPERATIVE NOTE  07/03/2023  Surgeon:  Bartley Lightning, MD  First Assistant: Debroah Fanning,  PA-C:   An experienced assistant was required given the complexity of this surgery and the standard of surgical care. The assistant was needed for endoscopic vein harvest, exposure, dissection, suctioning, retraction of delicate tissues and sutures, instrument exchange and for overall help during this procedure.   Preoperative Diagnosis:  Severe multi-vessel coronary artery disease   Postoperative Diagnosis:  Same   Procedure:  Median Sternotomy Extracorporeal circulation 3.   Coronary artery bypass grafting x 3  Left internal mammary artery graft to the LAD SVG to diagonal SVG to RCA  4.   Endoscopic vein harvest from the right leg   Anesthesia:  General Endotracheal   Clinical History/Surgical Indication:  This 78 year old gentleman has severe multivessel coronary disease involving the proximal LAD, first diagonal, and right coronary arteries. I agree that coronary artery bypass graft surgery is the best treatment to prevent ischemia and infarction and improve his quality of life. I discussed the operative procedure with the patient, wife and daughter including alternatives, benefits and risks; including but not limited to bleeding, blood transfusion, infection, stroke, myocardial infarction, graft failure, heart block requiring a permanent pacemaker, organ dysfunction, and death. [name] understands and agrees to proceed.   Preparation:  The patient was seen in the preoperative holding area and the correct patient, correct operation were confirmed with the patient after reviewing the medical record and catheterization. The consent was signed by me. Preoperative antibiotics were given. A pulmonary arterial line and radial arterial line were placed by the anesthesia team.  The patient was taken back to the operating room and positioned supine on the operating room table. After being placed under general endotracheal anesthesia by the anesthesia team a foley catheter was placed. The neck, chest, abdomen, and both legs were prepped with betadine soap and solution and draped in the usual sterile manner. A surgical time-out was taken and the correct patient and operative procedure were confirmed with the nursing and anesthesia staff.   Cardiopulmonary Bypass:  A median sternotomy was performed. The pericardium was opened in the midline. Right ventricular function appeared normal. The ascending aorta was of normal size and had no palpable plaque. There were no contraindications to aortic cannulation or cross-clamping. The patient was fully systemically heparinized and the ACT was maintained > 400 sec. The proximal aortic arch was cannulated with a 20 F aortic cannula for arterial inflow. Venous cannulation was performed via the right atrial appendage using a two-staged venous cannula. An antegrade cardioplegia/vent cannula was inserted into the mid-ascending aorta. Aortic occlusion was performed with a single cross-clamp. Systemic cooling to 32 degrees Centigrade and topical cooling of the heart with iced saline were used. Hyperkalemic antegrade cold blood cardioplegia was used to induce diastolic arrest and was then given at about 20 minute intervals throughout the period of arrest to maintain myocardial temperature at or below 10 degrees centigrade. A temperature probe was inserted into the interventricular septum and an insulating pad was placed in the pericardium.   Left internal mammary artery harvest:  The left side of the sternum was retracted using the Rultract retractor. The left internal mammary artery was harvested as a pedicle graft. All side branches were clipped. It was a medium-sized vessel of good quality with excellent blood flow. It was ligated distally and  divided. It was sprayed with topical papaverine solution to prevent vasospasm.   Endoscopic vein harvest: Performed by  Debroah Fanning, PA-C  The right greater saphenous vein was harvested endoscopically through a 2 cm incision medial to the right knee. It was harvested from the upper thigh to below the knee. It was a medium to large-sized vein of good quality. The side branches were all ligated with 4-0 silk ties.    Coronary arteries:  The coronary arteries were examined.  LAD:  large vessel with no distal disease. The diagonal was a medium caliber vessel with proximal disease but no distal disease LCX:  no stenosis on cath.  RCA:  large vessel beyond the acute margin with no disease.   Grafts: Assisted by Debroah Fanning, PA-C  LIMA to the LAD: 2.5 mm. It was sewn end to side using 8-0 prolene continuous suture. SVG to Diagonal:  1.6 mm. It was sewn end to side using 7-0 prolene continuous suture. SVG to RCA:  2.5 mm. It was sewn end to side using 7-0 prolene continuous suture.   The proximal vein graft anastomoses were performed to the mid-ascending aorta using continuous 6-0 prolene suture. Graft markers were placed around the proximal anastomoses.   Completion:  The patient was rewarmed to 37 degrees Centigrade. The clamp was removed from the LIMA pedicle and there was rapid warming of the septum and return of ventricular fibrillation. The crossclamp was removed with a time of 66 minutes. There was spontaneous return of sinus rhythm. The distal and proximal anastomoses were checked for hemostasis. The position of the grafts was satisfactory. Two temporary epicardial pacing wires were placed on the right atrium and two on the right ventricle. The patient was weaned from CPB without difficulty on no inotropes. CPB time was 94 minutes. Cardiac output was 5 LPM. TEE showed normal LV systolic function. Heparin  was fully reversed with protamine  and the aortic and venous cannulas removed.  Hemostasis was achieved. Mediastinal and left pleural drainage tubes were placed. The sternum was closed with #6 stainless steel wires. The fascia was closed with continuous # 1 vicryl suture. The subcutaneous tissue was closed with 2-0 vicryl continuous suture. The skin was closed with 3-0 vicryl subcuticular suture. All sponge, needle, and instrument counts were reported correct at the end of the case. Dry sterile dressings were placed over the incisions and around the chest tubes which were connected to pleurevac suction. The patient was then transported to the surgical intensive care unit in stable condition.

## 2023-07-04 ENCOUNTER — Inpatient Hospital Stay (HOSPITAL_COMMUNITY)

## 2023-07-04 LAB — CBC
HCT: 33.1 % — ABNORMAL LOW (ref 39.0–52.0)
HCT: 33.2 % — ABNORMAL LOW (ref 39.0–52.0)
Hemoglobin: 10.7 g/dL — ABNORMAL LOW (ref 13.0–17.0)
Hemoglobin: 10.9 g/dL — ABNORMAL LOW (ref 13.0–17.0)
MCH: 29.3 pg (ref 26.0–34.0)
MCH: 29.7 pg (ref 26.0–34.0)
MCHC: 32.3 g/dL (ref 30.0–36.0)
MCHC: 32.8 g/dL (ref 30.0–36.0)
MCV: 90.7 fL (ref 80.0–100.0)
MCV: 90.5 fL (ref 80.0–100.0)
Platelets: 127 10*3/uL — ABNORMAL LOW (ref 150–400)
Platelets: 126 10*3/uL — ABNORMAL LOW (ref 150–400)
RBC: 3.65 MIL/uL — ABNORMAL LOW (ref 4.22–5.81)
RBC: 3.67 MIL/uL — ABNORMAL LOW (ref 4.22–5.81)
RDW: 13.6 % (ref 11.5–15.5)
RDW: 13.7 % (ref 11.5–15.5)
WBC: 9.8 10*3/uL (ref 4.0–10.5)
WBC: 11.4 10*3/uL — ABNORMAL HIGH (ref 4.0–10.5)
nRBC: 0 % (ref 0.0–0.2)
nRBC: 0 % (ref 0.0–0.2)

## 2023-07-04 LAB — POCT I-STAT 7, (LYTES, BLD GAS, ICA,H+H)
Acid-base deficit: 3 mmol/L — ABNORMAL HIGH (ref 0.0–2.0)
Acid-base deficit: 4 mmol/L — ABNORMAL HIGH (ref 0.0–2.0)
Bicarbonate: 23.1 mmol/L (ref 20.0–28.0)
Bicarbonate: 21.7 mmol/L (ref 20.0–28.0)
Calcium, Ion: 1.1 mmol/L — ABNORMAL LOW (ref 1.15–1.40)
Calcium, Ion: 1.09 mmol/L — ABNORMAL LOW (ref 1.15–1.40)
HCT: 28 % — ABNORMAL LOW (ref 39.0–52.0)
HCT: 30 % — ABNORMAL LOW (ref 39.0–52.0)
Hemoglobin: 9.5 g/dL — ABNORMAL LOW (ref 13.0–17.0)
Hemoglobin: 10.2 g/dL — ABNORMAL LOW (ref 13.0–17.0)
O2 Saturation: 98 %
O2 Saturation: 98 %
Patient temperature: 36.7
Patient temperature: 36.8
Potassium: 4.2 mmol/L (ref 3.5–5.1)
Potassium: 4.3 mmol/L (ref 3.5–5.1)
Sodium: 141 mmol/L (ref 135–145)
Sodium: 141 mmol/L (ref 135–145)
TCO2: 25 mmol/L (ref 22–32)
TCO2: 23 mmol/L (ref 22–32)
pCO2 arterial: 45 mmHg (ref 32–48)
pCO2 arterial: 40.5 mmHg (ref 32–48)
pH, Arterial: 7.318 — ABNORMAL LOW (ref 7.35–7.45)
pH, Arterial: 7.336 — ABNORMAL LOW (ref 7.35–7.45)
pO2, Arterial: 124 mmHg — ABNORMAL HIGH (ref 83–108)
pO2, Arterial: 115 mmHg — ABNORMAL HIGH (ref 83–108)

## 2023-07-04 LAB — ECHO INTRAOPERATIVE TEE
Height: 71 in
MV M vel: 4.57 m/s
MV Peak grad: 83.5 mmHg
Radius: 0.6 cm
S' Lateral: 3.1 cm
Weight: 2619.2 [oz_av]

## 2023-07-04 LAB — BASIC METABOLIC PANEL WITH GFR
Anion gap: 8 (ref 5–15)
Anion gap: 7 (ref 5–15)
BUN: 10 mg/dL (ref 8–23)
BUN: 11 mg/dL (ref 8–23)
CO2: 22 mmol/L (ref 22–32)
CO2: 27 mmol/L (ref 22–32)
Calcium: 7.3 mg/dL — ABNORMAL LOW (ref 8.9–10.3)
Calcium: 7.5 mg/dL — ABNORMAL LOW (ref 8.9–10.3)
Chloride: 106 mmol/L (ref 98–111)
Chloride: 102 mmol/L (ref 98–111)
Creatinine, Ser: 0.93 mg/dL (ref 0.61–1.24)
Creatinine, Ser: 1.03 mg/dL (ref 0.61–1.24)
GFR, Estimated: >60 mL/min
GFR, Estimated: >60 mL/min (ref >60)
Glucose, Bld: 105 mg/dL — ABNORMAL HIGH (ref 70–99)
Glucose, Bld: 123 mg/dL — ABNORMAL HIGH (ref 70–99)
Potassium: 4 mmol/L (ref 3.5–5.1)
Potassium: 3.8 mmol/L (ref 3.5–5.1)
Sodium: 136 mmol/L (ref 135–145)
Sodium: 136 mmol/L (ref 135–145)

## 2023-07-04 LAB — GLUCOSE, CAPILLARY
Glucose-Capillary: 107 mg/dL — ABNORMAL HIGH (ref 70–99)
Glucose-Capillary: 122 mg/dL — ABNORMAL HIGH (ref 70–99)
Glucose-Capillary: 123 mg/dL — ABNORMAL HIGH (ref 70–99)
Glucose-Capillary: 133 mg/dL — ABNORMAL HIGH (ref 70–99)
Glucose-Capillary: 93 mg/dL (ref 70–99)
Glucose-Capillary: 95 mg/dL (ref 70–99)

## 2023-07-04 LAB — MAGNESIUM
Magnesium: 2.7 mg/dL — ABNORMAL HIGH (ref 1.7–2.4)
Magnesium: 2.4 mg/dL (ref 1.7–2.4)

## 2023-07-04 MED ORDER — WARFARIN - PHYSICIAN DOSING INPATIENT: Freq: Every day | Status: DC

## 2023-07-04 MED ORDER — POTASSIUM CHLORIDE CRYS ER 20 MEQ PO TBCR
20.0000 meq | EXTENDED_RELEASE_TABLET | Freq: Two times a day (BID) | ORAL | Status: AC
Start: 2023-07-04 — End: 2023-07-04
  Administered 2023-07-04 (×2): 20 meq via ORAL
  Filled 2023-07-04 (×2): qty 1

## 2023-07-04 MED ORDER — POTASSIUM CHLORIDE 10 MEQ/50ML IV SOLN
10.0000 meq | INTRAVENOUS | Status: AC
  Administered 2023-07-04 (×2): 10 meq via INTRAVENOUS
  Filled 2023-07-04 (×2): qty 50

## 2023-07-04 MED ORDER — TRAMADOL HCL 50 MG PO TABS
50.0000 mg | ORAL_TABLET | ORAL | Status: DC | PRN
  Administered 2023-07-04 – 2023-07-06 (×3): 50 mg via ORAL
  Filled 2023-07-04 (×3): qty 1

## 2023-07-04 MED ORDER — INSULIN ASPART 100 UNIT/ML IJ SOLN
0.0000 [IU] | INTRAMUSCULAR | Status: DC
  Administered 2023-07-04 – 2023-07-05 (×6): 2 [IU] via SUBCUTANEOUS

## 2023-07-04 MED ORDER — ASPIRIN 81 MG PO TBEC
81.0000 mg | DELAYED_RELEASE_TABLET | Freq: Every day | ORAL | Status: DC
  Administered 2023-07-04 – 2023-07-05 (×2): 81 mg via ORAL
  Filled 2023-07-04 (×2): qty 1

## 2023-07-04 MED ORDER — WARFARIN SODIUM 2 MG PO TABS: 2.0000 mg | ORAL_TABLET | Freq: Once | ORAL | Status: DC

## 2023-07-04 MED ORDER — ASPIRIN 81 MG PO CHEW: 81.0000 mg | CHEWABLE_TABLET | Freq: Every day | ORAL | Status: DC

## 2023-07-04 MED ORDER — ENOXAPARIN SODIUM 40 MG/0.4ML IJ SOSY
40.0000 mg | PREFILLED_SYRINGE | Freq: Every day | INTRAMUSCULAR | Status: DC
  Administered 2023-07-04 – 2023-07-05 (×2): 40 mg via SUBCUTANEOUS
  Filled 2023-07-04 (×2): qty 0.4

## 2023-07-04 MED ORDER — FUROSEMIDE 10 MG/ML IJ SOLN
40.0000 mg | Freq: Two times a day (BID) | INTRAMUSCULAR | Status: AC
  Administered 2023-07-04 (×2): 40 mg via INTRAVENOUS
  Filled 2023-07-04 (×2): qty 4

## 2023-07-04 NOTE — Anesthesia Postprocedure Evaluation (Signed)
 Anesthesia Post Note  Patient: Paul Ryan  Procedure(s) Performed: CORONARY ARTERY BYPASS GRAFTING X 3, USING LEFT INTERNAL MAMMARY ARTERY AND ENDOSCOPIC VEIN HARVEST RIGHT GREATER SAPHENOUS VEIN (Chest) ECHOCARDIOGRAM, TRANSESOPHAGEAL, INTRAOPERATIVE     Patient location during evaluation: SICU Anesthesia Type: General Level of consciousness: sedated Pain management: pain level controlled Vital Signs Assessment: post-procedure vital signs reviewed and stable Respiratory status: patient remains intubated per anesthesia plan Cardiovascular status: stable Postop Assessment: no apparent nausea or vomiting Anesthetic complications: no   No notable events documented.               Ashanti Littles D Danial Hlavac

## 2023-07-04 NOTE — Progress Notes (Signed)
 1 Day Post-Op Procedure(s) (LRB): CORONARY ARTERY BYPASS GRAFTING X 3, USING LEFT INTERNAL MAMMARY ARTERY AND ENDOSCOPIC VEIN HARVEST RIGHT GREATER SAPHENOUS VEIN (N/A) ECHOCARDIOGRAM, TRANSESOPHAGEAL, INTRAOPERATIVE (N/A) Subjective: No complaints.  Objective: Vital signs in last 24 hours: Temp:  [96.3 F (35.7 C)-98.8 F (37.1 C)] 98.4 F (36.9 C) (05/03 0715) Pulse Rate:  [68-90] 81 (05/03 0715) Cardiac Rhythm: A-V Sequential paced (05/03 0015) Resp:  [7-26] 15 (05/03 0715) BP: (82-124)/(56-78) 101/69 (05/03 0715) SpO2:  [95 %-100 %] 95 % (05/03 0715) Arterial Line BP: (80-266)/(42-261) 120/55 (05/03 0715) FiO2 (%):  [40 %-50 %] 40 % (05/02 1609) Weight:  [76.2 kg] 76.2 kg (05/03 0500)  Hemodynamic parameters for last 24 hours: PAP: (16-37)/(3-21) 22/11 CO:  [3.3 L/min-5.9 L/min] 5.9 L/min CI:  [1.7 L/min/m2-3.1 L/min/m2] 3.1 L/min/m2  Intake/Output from previous day: 05/02 0701 - 05/03 0700 In: 5428.6 [P.O.:240; I.V.:1874.8; Blood:470; IV Piggyback:2843.8] Out: 4315 [Urine:3070; Blood:675; Chest Tube:570] Intake/Output this shift: No intake/output data recorded.  General appearance: alert and cooperative Neurologic: intact Heart: regular rate and rhythm Lungs: clear to auscultation bilaterally Extremities: edema mild Wound: dressing dry  Lab Results: Recent Labs    07/03/23 1900 07/04/23 0521  WBC 9.1 9.8  HGB 10.1* 10.7*  HCT 31.0* 33.1*  PLT 115* 127*   BMET:  Recent Labs    07/03/23 1900 07/04/23 0521  NA 135 136  K 4.1 4.0  CL 106 106  CO2 21* 22  GLUCOSE 120* 105*  BUN 15 10  CREATININE 1.03 0.93  CALCIUM  7.4* 7.3*    PT/INR:  Recent Labs    07/03/23 1305  LABPROT 17.6*  INR 1.4*   ABG    Component Value Date/Time   PHART 7.336 (L) 07/03/2023 1734   HCO3 21.7 07/03/2023 1734   TCO2 23 07/03/2023 1734   ACIDBASEDEF 4.0 (H) 07/03/2023 1734   O2SAT 98 07/03/2023 1734   CBG (last 3)  Recent Labs    07/04/23 0000 07/04/23 0351  07/04/23 0717  GLUCAP 95 93 123*   CXR: clear.  ECG: sinus, 1st degree AV block.  Assessment/Plan: S/P Procedure(s) (LRB): CORONARY ARTERY BYPASS GRAFTING X 3, USING LEFT INTERNAL MAMMARY ARTERY AND ENDOSCOPIC VEIN HARVEST RIGHT GREATER SAPHENOUS VEIN (N/A) ECHOCARDIOGRAM, TRANSESOPHAGEAL, INTRAOPERATIVE (N/A)  POD 1 Hemodynamically stable in sinus rhythm 70's. Will hold off on Lopressor  for now with 1st degree AV block.  Keep pacer wires in today.  St is 4 lbs over preop. Start diuresis.  Dangle and DC chest tubes today.  Glucose under good control on SSI.  Hx of protein S deficiency with hypercoagulability. Will start subQ lovenox  and continue ASA. Plan to remove pacing wires tomorrow am if rhythm stable today and then resume Coumadin .  DC arterial line and swan.  IS, OOB, mobilize.   LOS: 1 day    Paul Ryan 07/04/2023

## 2023-07-04 NOTE — Plan of Care (Signed)
  Problem: Education: Goal: Knowledge of General Education information will improve Description: Including pain rating scale, medication(s)/side effects and non-pharmacologic comfort measures Outcome: Progressing   Problem: Health Behavior/Discharge Planning: Goal: Ability to manage health-related needs will improve Outcome: Progressing   Problem: Clinical Measurements: Goal: Ability to maintain clinical measurements within normal limits will improve Outcome: Progressing Goal: Will remain free from infection Outcome: Adequate for Discharge Goal: Diagnostic test results will improve Outcome: Progressing Goal: Respiratory complications will improve Outcome: Progressing Goal: Cardiovascular complication will be avoided Outcome: Progressing   Problem: Activity: Goal: Risk for activity intolerance will decrease Outcome: Progressing   Problem: Coping: Goal: Level of anxiety will decrease Outcome: Progressing   Problem: Pain Managment: Goal: General experience of comfort will improve and/or be controlled Outcome: Progressing   Problem: Safety: Goal: Ability to remain free from injury will improve Outcome: Adequate for Discharge

## 2023-07-04 NOTE — Progress Notes (Signed)
 Patient ID: Paul Ryan, male   DOB: [birth year], 78 y.o.   MRN: [id number]  TCTS Evening Rounds:  Hemodynamically stable in sinus rhythm 77  Awake and alert, comfortable.  Diuresed well today.  CBC    Component Value Date/Time   WBC 11.4 (H) 07/04/2023 1554   RBC 3.67 (L) 07/04/2023 1554   HGB 10.9 (L) 07/04/2023 1554   HGB 15.3 06/04/2023 1429   HGB 15.2 05/17/2013 0902   HCT 33.2 (L) 07/04/2023 1554   HCT 45.6 06/04/2023 1429   HCT 44.6 05/17/2013 0902   PLT 126 (L) 07/04/2023 1554   PLT 257 06/04/2023 1429   MCV 90.5 07/04/2023 1554   MCV 88 06/04/2023 1429   MCV 89.4 05/17/2013 0902   MCH 29.7 07/04/2023 1554   MCHC 32.8 07/04/2023 1554   RDW 13.7 07/04/2023 1554   RDW 13.4 06/04/2023 1429   RDW 13.7 05/17/2013 0902   LYMPHSABS 1.9 06/22/2023 0939   LYMPHSABS 2.2 06/10/2016 1033   LYMPHSABS 1.7 05/17/2013 0902   MONOABS 0.6 06/22/2023 0939   MONOABS 0.4 05/17/2013 0902   EOSABS 0.1 06/22/2023 0939   EOSABS 0.2 06/10/2016 1033   BASOSABS 0.0 06/22/2023 0939   BASOSABS 0.0 06/10/2016 1033   BASOSABS 0.0 05/17/2013 0902   BMET    Component Value Date/Time   NA 136 07/04/2023 1554   NA 143 06/04/2023 1429   NA 143 11/16/2012 1145   K 3.8 07/04/2023 1554   K 4.4 11/16/2012 1145   CL 102 07/04/2023 1554   CO2 27 07/04/2023 1554   CO2 27 11/16/2012 1145   GLUCOSE 123 (H) 07/04/2023 1554   GLUCOSE 99 11/16/2012 1145   BUN 11 07/04/2023 1554   BUN 16 06/04/2023 1429   BUN 16.2 11/16/2012 1145   CREATININE 1.03 07/04/2023 1554   CREATININE 0.90 03/21/2014 1521   CREATININE 1.1 11/16/2012 1145   CALCIUM  7.5 (L) 07/04/2023 1554   CALCIUM  9.3 11/16/2012 1145   EGFR 65 06/04/2023 1429   GFRNONAA >60 07/04/2023 1554

## 2023-07-04 NOTE — Hospital Course (Addendum)
 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 He has a brother who had a heart attack and 2 stents placed in [place]. The patient had an episode of substernal chest discomfort and called his brother to see what his chest pain symptoms were like and they matched his so he called Dr. Renna Cary. He had a PET stress test performed on 05/27/2023 which showed flow reduction in the anterior wall distribution consistent with ischemia. Left ventricular ejection fraction is 48%. He subsequently underwent coronary angiography showing a 70 to 80% proximal LAD stenosis and 95% stenosis at a large first diagonal branch. There is about 20% in a marginal branch and the RCA had a long area of 80% proximal to mid vessel stenosis.   He was evaluated by Dr. Sherene Dilling who was in agreement the patient would be a reasonable candidate for coronary bypass grafting procedure.  The risks and benefits of the procedure were explained to the patient and she was agreeable to proceed.  Hospital Course:  Paul Ryan presented to [facility] on 07/03/2023.  He was taken to the operating room and underwent CABG x 3 utilizing LIMA to LAD, SVG to Diagonal, and SVG to RCA.  He also underwent endoscopic vein from his right leg.  He tolerated the procedure without difficulty and was taken to the SICU in stable condition.  The patient was extubated without difficulty.  His post operative EKG showed prolonged PR interval.  This progressed to 1st degree AV Block and he was not additionally started on Lopressor  due to this.  His heart rate and rhythm did stabilize and ultimately a beta-blocker was initiated.  His chest tubes and arterial lines were removed without difficulty.  His pacing wires were removed on the day of  discharge.  He was started on lasix  to facilitate diuresis.  He has a history of protein S deficiency.  He was initially treated with Lovenox , but will be transitioned to home regimen of Coumadin  prior to discharge.  He had issues with post operative nausea.  He was treated with reglan  and phenergan .  He was hypokalemic and was supplemented with IV replacement.  He made excellent progress in terms of his physical recovery send standard cardiac rehab modalities and working with the mobility team.  Incisions are healing well without evidence of infection.  He is tolerating diet.  Renal function has remained within normal limits.  He did have a mild expected acute blood loss anemia which stabilized.  Oxygen was weaned and he maintained good saturations on room air.  Overall, at the time of discharge she was felt to be quite stable.

## 2023-07-05 ENCOUNTER — Inpatient Hospital Stay (HOSPITAL_COMMUNITY)

## 2023-07-05 LAB — TYPE AND SCREEN
ABO/RH(D): AB POS
Antibody Screen: NEGATIVE
Unit division: 0
Unit division: 0

## 2023-07-05 LAB — BPAM RBC
Blood Product Expiration Date: 202505252359
Blood Product Expiration Date: 202505252359
ISSUE DATE / TIME: 202505020853
ISSUE DATE / TIME: 202505020853
Unit Type and Rh: 6200
Unit Type and Rh: 6200

## 2023-07-05 LAB — CBC
HCT: 36 % — ABNORMAL LOW (ref 39.0–52.0)
Hemoglobin: 11.4 g/dL — ABNORMAL LOW (ref 13.0–17.0)
MCH: 29.1 pg (ref 26.0–34.0)
MCHC: 31.7 g/dL (ref 30.0–36.0)
MCV: 91.8 fL (ref 80.0–100.0)
Platelets: 125 10*3/uL — ABNORMAL LOW (ref 150–400)
RBC: 3.92 MIL/uL — ABNORMAL LOW (ref 4.22–5.81)
RDW: 13.6 % (ref 11.5–15.5)
WBC: 11.6 10*3/uL — ABNORMAL HIGH (ref 4.0–10.5)
nRBC: 0 % (ref 0.0–0.2)

## 2023-07-05 LAB — GLUCOSE, CAPILLARY
Glucose-Capillary: 101 mg/dL — ABNORMAL HIGH (ref 70–99)
Glucose-Capillary: 105 mg/dL — ABNORMAL HIGH (ref 70–99)
Glucose-Capillary: 108 mg/dL — ABNORMAL HIGH (ref 70–99)
Glucose-Capillary: 113 mg/dL — ABNORMAL HIGH (ref 70–99)
Glucose-Capillary: 127 mg/dL — ABNORMAL HIGH (ref 70–99)
Glucose-Capillary: 129 mg/dL — ABNORMAL HIGH (ref 70–99)
Glucose-Capillary: 155 mg/dL — ABNORMAL HIGH (ref 70–99)

## 2023-07-05 LAB — BASIC METABOLIC PANEL WITH GFR
Anion gap: 9 (ref 5–15)
BUN: 11 mg/dL (ref 8–23)
CO2: 27 mmol/L (ref 22–32)
Calcium: 7.9 mg/dL — ABNORMAL LOW (ref 8.9–10.3)
Chloride: 100 mmol/L (ref 98–111)
Creatinine, Ser: 1.07 mg/dL (ref 0.61–1.24)
GFR, Estimated: >60 mL/min (ref >60)
Glucose, Bld: 124 mg/dL — ABNORMAL HIGH (ref 70–99)
Potassium: 3.7 mmol/L (ref 3.5–5.1)
Sodium: 136 mmol/L (ref 135–145)

## 2023-07-05 LAB — PROTIME-INR
INR: 1.3 — ABNORMAL HIGH (ref 0.8–1.2)
Prothrombin Time: 16.2 s — ABNORMAL HIGH (ref 11.4–15.2)

## 2023-07-05 MED ORDER — SODIUM CHLORIDE 0.9 % IV SOLN: 12.5000 mg | Freq: Four times a day (QID) | INTRAVENOUS | Status: DC | PRN

## 2023-07-05 MED ORDER — WARFARIN - PHYSICIAN DOSING INPATIENT: Freq: Every day | Status: DC

## 2023-07-05 MED ORDER — POTASSIUM CHLORIDE 10 MEQ/50ML IV SOLN
10.0000 meq | INTRAVENOUS | Status: AC
  Administered 2023-07-05 (×3): 10 meq via INTRAVENOUS
  Filled 2023-07-05 (×3): qty 50

## 2023-07-05 MED ORDER — POTASSIUM CHLORIDE 10 MEQ/50ML IV SOLN
10.0000 meq | INTRAVENOUS | Status: AC
  Administered 2023-07-05 (×2): 10 meq via INTRAVENOUS
  Filled 2023-07-05 (×2): qty 50

## 2023-07-05 MED ORDER — WARFARIN SODIUM 2 MG PO TABS
4.0000 mg | ORAL_TABLET | Freq: Once | ORAL | Status: AC
  Administered 2023-07-05: 4 mg via ORAL
  Filled 2023-07-05: qty 2

## 2023-07-05 MED ORDER — SODIUM CHLORIDE 0.9 % IV SOLN
12.5000 mg | Freq: Once | INTRAVENOUS | Status: AC
  Administered 2023-07-05: 12.5 mg via INTRAVENOUS
  Filled 2023-07-05: qty 0.5

## 2023-07-05 MED ORDER — METOCLOPRAMIDE HCL 5 MG/ML IJ SOLN
10.0000 mg | Freq: Four times a day (QID) | INTRAMUSCULAR | Status: DC
  Administered 2023-07-05 – 2023-07-06 (×6): 10 mg via INTRAVENOUS
  Filled 2023-07-05 (×6): qty 2

## 2023-07-05 MED ORDER — FUROSEMIDE 10 MG/ML IJ SOLN
20.0000 mg | Freq: Once | INTRAMUSCULAR | Status: AC
  Administered 2023-07-05: 20 mg via INTRAVENOUS
  Filled 2023-07-05: qty 2

## 2023-07-05 NOTE — Plan of Care (Signed)
  Problem: Clinical Measurements: Goal: Respiratory complications will improve Outcome: Adequate for Discharge Goal: Cardiovascular complication will be avoided Outcome: Progressing   Problem: Safety: Goal: Ability to remain free from injury will improve Outcome: Adequate for Discharge

## 2023-07-05 NOTE — Progress Notes (Addendum)
 2 Days Post-Op Procedure(s) (LRB): CORONARY ARTERY BYPASS GRAFTING X 3, USING LEFT INTERNAL MAMMARY ARTERY AND ENDOSCOPIC VEIN HARVEST RIGHT GREATER SAPHENOUS VEIN (N/A) ECHOCARDIOGRAM, TRANSESOPHAGEAL, INTRAOPERATIVE (N/A) Subjective:  Had a lot of nausea yesterday and overnight not responding to Zofran .   Objective: Vital signs in last 24 hours: Temp:  [98.1 F (36.7 C)-99.1 F (37.3 C)] 98.6 F (37 C) (05/04 0800) Pulse Rate:  [64-96] 68 (05/04 0800) Cardiac Rhythm: Normal sinus rhythm (05/04 0800) Resp:  [5-34] 15 (05/04 0800) BP: (89-130)/(53-75) 89/67 (05/04 0800) SpO2:  [88 %-99 %] 95 % (05/04 0800) Weight:  [75.4 kg] 75.4 kg (05/04 0500)  Hemodynamic parameters for last 24 hours:    Intake/Output from previous day: 05/03 0701 - 05/04 0700 In: 651.5 [I.V.:341.4; IV Piggyback:310.1] Out: 3090 [Urine:3050; Chest Tube:40] Intake/Output this shift: Total I/O In: 334.8 [P.O.:240; I.V.:0.4; IV Piggyback:94.4] Out: -   General appearance: alert and cooperative Neurologic: intact Heart: regular rate and rhythm Lungs: clear to auscultation bilaterally Abdomen: soft, non-tender; bowel sounds normal Extremities: edema minimal Wound: incision ok  Lab Results: Recent Labs    07/04/23 1554 07/05/23 0323  WBC 11.4* 11.6*  HGB 10.9* 11.4*  HCT 33.2* 36.0*  PLT 126* 125*   BMET:  Recent Labs    07/04/23 1554 07/05/23 0323  NA 136 136  K 3.8 3.7  CL 102 100  CO2 27 27  GLUCOSE 123* 124*  BUN 11 11  CREATININE 1.03 1.07  CALCIUM  7.5* 7.9*    PT/INR:  Recent Labs    07/05/23 0323  LABPROT 16.2*  INR 1.3*   ABG    Component Value Date/Time   PHART 7.336 (L) 07/03/2023 1734   HCO3 21.7 07/03/2023 1734   TCO2 23 07/03/2023 1734   ACIDBASEDEF 4.0 (H) 07/03/2023 1734   O2SAT 98 07/03/2023 1734   CBG (last 3)  Recent Labs    07/04/23 2027 07/05/23 0006 07/05/23 0737  GLUCAP 122* 101* 129*   CXR: small left effusion and mild basilar  atelectasis  Assessment/Plan: S/P Procedure(s) (LRB): CORONARY ARTERY BYPASS GRAFTING X 3, USING LEFT INTERNAL MAMMARY ARTERY AND ENDOSCOPIC VEIN HARVEST RIGHT GREATER SAPHENOUS VEIN (N/A) ECHOCARDIOGRAM, TRANSESOPHAGEAL, INTRAOPERATIVE (N/A)  POD 2 Hemodynamically stable in sinus rhythm. BP is low normal so will hold off on Lopressor .  -2438 cc yesterday: Wt down 2 lbs. Still 2 lbs over preop. Will give a dose of lasix this am. Continue IV KCL.  Can remove foley later after diuresis.  Keep sleeve in for IV KCL.  DM: glucose under good control on SSI.  Nausea may be related to DM or pain meds or both. Stick with Tylenol . Continue Reglan , phenergan  prn.  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 Resume Coumadin  today. Continue Lovenox  and ASA.  Continue IS, ambulation.  LOS: 2 days    Bartley Lightning 07/05/2023

## 2023-07-05 NOTE — Plan of Care (Signed)
  Problem: Education: Goal: Knowledge of General Education information will improve Description: Including pain rating scale, medication(s)/side effects and non-pharmacologic comfort measures Outcome: Progressing   Problem: Health Behavior/Discharge Planning: Goal: Ability to manage health-related needs will improve Outcome: Progressing   Problem: Clinical Measurements: Goal: Ability to maintain clinical measurements within normal limits will improve Outcome: Progressing Goal: Will remain free from infection Outcome: Progressing Goal: Diagnostic test results will improve Outcome: Progressing Goal: Respiratory complications will improve Outcome: Progressing Goal: Cardiovascular complication will be avoided Outcome: Progressing   Problem: Activity: Goal: Risk for activity intolerance will decrease Outcome: Progressing   Problem: Nutrition: Goal: Adequate nutrition will be maintained Outcome: Progressing   Problem: Coping: Goal: Level of anxiety will decrease Outcome: Progressing   Problem: Elimination: Goal: Will not experience complications related to bowel motility Outcome: Progressing Goal: Will not experience complications related to urinary retention Outcome: Progressing   Problem: Pain Managment: Goal: General experience of comfort will improve and/or be controlled Outcome: Progressing   Problem: Safety: Goal: Ability to remain free from injury will improve Outcome: Progressing   Problem: Skin Integrity: Goal: Risk for impaired skin integrity will decrease Outcome: Progressing   Problem: Education: Goal: Will demonstrate proper wound care and an understanding of methods to prevent future damage Outcome: Progressing Goal: Knowledge of disease or condition will improve Outcome: Progressing Goal: Knowledge of the prescribed therapeutic regimen will improve Outcome: Progressing Goal: Individualized Educational Video(s) Outcome: Progressing   Problem:  Activity: Goal: Risk for activity intolerance will decrease Outcome: Progressing   Problem: Cardiac: Goal: Will achieve and/or maintain hemodynamic stability Outcome: Progressing   Problem: Clinical Measurements: Goal: Postoperative complications will be avoided or minimized Outcome: Progressing   Problem: Respiratory: Goal: Respiratory status will improve Outcome: Progressing   Problem: Skin Integrity: Goal: Wound healing without signs and symptoms of infection Outcome: Progressing Goal: Risk for impaired skin integrity will decrease Outcome: Progressing   Problem: Urinary Elimination: Goal: Ability to achieve and maintain adequate renal perfusion and functioning will improve Outcome: Progressing

## 2023-07-05 NOTE — Plan of Care (Signed)
 The patient remains on [facility] at time of writing. Introducer ([facility]) and foley catheter remain in place. Patient reports neck pain overnight that he attributes to bed/positing; comfort measures provided, including PRN tramadol  (per orders). Patient ambulates to restroom with front wheel walker and 1 person assisting without complication.   Problem: Education: Goal: Knowledge of General Education information will improve Description: Including pain rating scale, medication(s)/side effects and non-pharmacologic comfort measures 07/05/2023 2230 by Mackey Sayers, RN Outcome: Progressing 07/05/2023 2230 by Mackey Sayers, RN Outcome: Progressing   Problem: Health Behavior/Discharge Planning: Goal: Ability to manage health-related needs will improve 07/05/2023 2230 by Mackey Sayers, RN Outcome: Progressing 07/05/2023 2230 by Mackey Sayers, RN Outcome: Progressing   Problem: Clinical Measurements: Goal: Ability to maintain clinical measurements within normal limits will improve 07/05/2023 2230 by Mackey Sayers, RN Outcome: Progressing 07/05/2023 2230 by Mackey Sayers, RN Outcome: Progressing Goal: Will remain free from infection 07/05/2023 2230 by Mackey Sayers, RN Outcome: Progressing 07/05/2023 2230 by Mackey Sayers, RN Outcome: Progressing Goal: Diagnostic test results will improve 07/05/2023 2230 by Mackey Sayers, RN Outcome: Progressing 07/05/2023 2230 by Mackey Sayers, RN Outcome: Progressing Goal: Respiratory complications will improve 07/05/2023 2230 by Mackey Sayers, RN Outcome: Progressing 07/05/2023 2230 by Mackey Sayers, RN Outcome: Progressing Goal: Cardiovascular complication will be avoided 07/05/2023 2230 by Mackey Sayers, RN Outcome: Progressing 07/05/2023 2230 by Mackey Sayers, RN Outcome: Progressing   Problem: Activity: Goal: Risk for activity intolerance will decrease 07/05/2023 2230 by Mackey Sayers, RN Outcome: Progressing 07/05/2023 2230 by Mackey Sayers, RN Outcome: Progressing   Problem: Nutrition: Goal: Adequate nutrition will be maintained 07/05/2023 2230 by Mackey Sayers, RN Outcome: Progressing 07/05/2023 2230 by Mackey Sayers, RN Outcome: Progressing   Problem: Coping: Goal: Level of anxiety will decrease 07/05/2023 2230 by Mackey Sayers, RN Outcome: Progressing 07/05/2023 2230 by Mackey Sayers, RN Outcome: Progressing   Problem: Elimination: Goal: Will not experience complications related to bowel motility 07/05/2023 2230 by Mackey Sayers, RN Outcome: Progressing 07/05/2023 2230 by Mackey Sayers, RN Outcome: Progressing Goal: Will not experience complications related to urinary retention 07/05/2023 2230 by Mackey Sayers, RN Outcome: Progressing 07/05/2023 2230 by Mackey Sayers, RN Outcome: Progressing   Problem: Pain Managment: Goal: General experience of comfort will improve and/or be controlled 07/05/2023 2230 by Mackey Sayers, RN Outcome: Progressing 07/05/2023 2230 by Mackey Sayers, RN Outcome: Progressing   Problem: Safety: Goal: Ability to remain free from injury will improve 07/05/2023 2230 by Mackey Sayers, RN Outcome: Progressing 07/05/2023 2230 by Mackey Sayers, RN Outcome: Progressing   Problem: Skin Integrity: Goal: Risk for impaired skin integrity will decrease 07/05/2023 2230 by Mackey Sayers, RN Outcome: Progressing 07/05/2023 2230 by Mackey Sayers, RN Outcome: Progressing   Problem: Education: Goal: Will demonstrate proper wound care and an understanding of methods to prevent future damage 07/05/2023 2230 by Mackey Sayers, RN Outcome: Progressing 07/05/2023 2230 by Mackey Sayers, RN Outcome: Progressing Goal: Knowledge of disease or condition will improve 07/05/2023 2230 by Mackey Sayers, RN Outcome: Progressing 07/05/2023 2230 by Mackey Sayers, RN Outcome: Progressing Goal: Knowledge of the prescribed therapeutic regimen will improve 07/05/2023 2230 by Mackey Sayers,  RN Outcome: Progressing 07/05/2023 2230 by Mackey Sayers, RN Outcome: Progressing Goal: Individualized Educational Video(s) 07/05/2023 2230 by Mackey Sayers, RN Outcome: Progressing 07/05/2023 2230 by Mackey Sayers,  RN Outcome: Progressing   Problem: Activity: Goal: Risk for activity intolerance will decrease 07/05/2023 2230 by Mackey Sayers, RN Outcome: Progressing 07/05/2023 2230 by Mackey Sayers, RN Outcome: Progressing   Problem: Cardiac: Goal: Will achieve and/or maintain hemodynamic stability 07/05/2023 2230 by Mackey Sayers, RN Outcome: Progressing 07/05/2023 2230 by Mackey Sayers, RN Outcome: Progressing   Problem: Clinical Measurements: Goal: Postoperative complications will be avoided or minimized 07/05/2023 2230 by Mackey Sayers, RN Outcome: Progressing 07/05/2023 2230 by Mackey Sayers, RN Outcome: Progressing   Problem: Respiratory: Goal: Respiratory status will improve 07/05/2023 2230 by Mackey Sayers, RN Outcome: Progressing 07/05/2023 2230 by Mackey Sayers, RN Outcome: Progressing   Problem: Skin Integrity: Goal: Wound healing without signs and symptoms of infection 07/05/2023 2230 by Mackey Sayers, RN Outcome: Progressing 07/05/2023 2230 by Mackey Sayers, RN Outcome: Progressing Goal: Risk for impaired skin integrity will decrease 07/05/2023 2230 by Mackey Sayers, RN Outcome: Progressing 07/05/2023 2230 by Mackey Sayers, RN Outcome: Progressing   Problem: Urinary Elimination: Goal: Ability to achieve and maintain adequate renal perfusion and functioning will improve 07/05/2023 2230 by Mackey Sayers, RN Outcome: Progressing 07/05/2023 2230 by Mackey Sayers, RN Outcome: Progressing

## 2023-07-05 NOTE — Progress Notes (Signed)
 Patient ID: Paul Ryan, male   DOB: Jan 11, 1946, 78 y.o.   MRN: [id number] [facility] Evening Rounds:  Hemodynamically stable in sinus rhythm.  Ambulated.   Nausea resolved.  Diuresing well. Will give him a little extra KCL this evening.

## 2023-07-06 ENCOUNTER — Encounter (HOSPITAL_COMMUNITY): Payer: Self-pay | Admitting: Surgery

## 2023-07-06 ENCOUNTER — Ambulatory Visit: Payer: Medicare PPO | Admitting: Neurology

## 2023-07-06 LAB — GLUCOSE, CAPILLARY
Glucose-Capillary: 107 mg/dL — ABNORMAL HIGH (ref 70–99)
Glucose-Capillary: 167 mg/dL — ABNORMAL HIGH (ref 70–99)
Glucose-Capillary: 73 mg/dL (ref 70–99)
Glucose-Capillary: 102 mg/dL — ABNORMAL HIGH (ref 70–99)
Glucose-Capillary: 118 mg/dL — ABNORMAL HIGH (ref 70–99)

## 2023-07-06 LAB — PROTIME-INR
INR: 1.2 (ref 0.8–1.2)
Prothrombin Time: 15.1 s (ref 11.4–15.2)

## 2023-07-06 MED ORDER — SODIUM CHLORIDE 0.9% FLUSH
3.0000 mL | Freq: Two times a day (BID) | INTRAVENOUS | Status: DC
  Administered 2023-07-06 – 2023-07-08 (×5): 3 mL via INTRAVENOUS

## 2023-07-06 MED ORDER — ~~LOC~~ CARDIAC SURGERY, PATIENT & FAMILY EDUCATION: Freq: Once | Status: AC

## 2023-07-06 MED ORDER — METOPROLOL TARTRATE 12.5 MG HALF TABLET
12.5000 mg | ORAL_TABLET | Freq: Two times a day (BID) | ORAL | Status: DC
  Administered 2023-07-06 – 2023-07-08 (×5): 12.5 mg via ORAL
  Filled 2023-07-06 (×5): qty 1

## 2023-07-06 MED ORDER — SENNOSIDES-DOCUSATE SODIUM 8.6-50 MG PO TABS
1.0000 | ORAL_TABLET | Freq: Two times a day (BID) | ORAL | Status: DC
  Administered 2023-07-06 – 2023-07-08 (×5): 1 via ORAL
  Filled 2023-07-06 (×5): qty 1

## 2023-07-06 MED ORDER — SODIUM CHLORIDE 0.9 % IV SOLN: 250.0000 mL | INTRAVENOUS | Status: AC | PRN

## 2023-07-06 MED ORDER — INSULIN ASPART 100 UNIT/ML IJ SOLN
0.0000 [IU] | Freq: Three times a day (TID) | INTRAMUSCULAR | Status: DC
  Administered 2023-07-06: 4 [IU] via SUBCUTANEOUS
  Administered 2023-07-07: 2 [IU] via SUBCUTANEOUS

## 2023-07-06 MED ORDER — PANTOPRAZOLE SODIUM 40 MG PO TBEC
40.0000 mg | DELAYED_RELEASE_TABLET | Freq: Every day | ORAL | Status: DC
  Administered 2023-07-07 – 2023-07-08 (×2): 40 mg via ORAL
  Filled 2023-07-06 (×2): qty 1

## 2023-07-06 MED ORDER — ASPIRIN 81 MG PO TBEC
81.0000 mg | DELAYED_RELEASE_TABLET | Freq: Every day | ORAL | Status: DC
  Administered 2023-07-06 – 2023-07-08 (×3): 81 mg via ORAL
  Filled 2023-07-06 (×3): qty 1

## 2023-07-06 MED ORDER — SODIUM CHLORIDE 0.9% FLUSH: 3.0000 mL | INTRAVENOUS | Status: DC | PRN

## 2023-07-06 MED ORDER — WARFARIN SODIUM 2 MG PO TABS
4.0000 mg | ORAL_TABLET | Freq: Once | ORAL | Status: AC
  Administered 2023-07-06: 4 mg via ORAL
  Filled 2023-07-06: qty 2

## 2023-07-06 MED FILL — Thrombin (Recombinant) For Soln 20000 Unit: CUTANEOUS | Qty: 1 | Status: AC

## 2023-07-06 NOTE — Progress Notes (Signed)
 CARDIAC REHAB PHASE I   Stopped by to offer walk in hallway. Pt in bathroom assisted via unit staff. Per pt wife, mobilizing well, only complaint, fatigues easily. Will return later today as time permits.     [id number] Ronny Colas, RN BSN 07/06/2023 11:31 AM

## 2023-07-06 NOTE — Plan of Care (Signed)
   Problem: Education: Goal: Knowledge of General Education information will improve Description: Including pain rating scale, medication(s)/side effects and non-pharmacologic comfort measures Outcome: Progressing   Problem: Clinical Measurements: Goal: Respiratory complications will improve Outcome: Progressing   Problem: Activity: Goal: Risk for activity intolerance will decrease Outcome: Progressing

## 2023-07-06 NOTE — Progress Notes (Signed)
 3 Days Post-Op Procedure(s) (LRB): CORONARY ARTERY BYPASS GRAFTING X 3, USING LEFT INTERNAL MAMMARY ARTERY AND ENDOSCOPIC VEIN HARVEST RIGHT GREATER SAPHENOUS VEIN (N/A) ECHOCARDIOGRAM, TRANSESOPHAGEAL, INTRAOPERATIVE (N/A) Subjective: No complaints. Slept well, ambulated. No further nausea. Ate breakfast.  Objective: Vital signs in last 24 hours: Temp:  [97.7 F (36.5 C)-98.8 F (37.1 C)] 98.7 F (37.1 C) (05/05 0400) Pulse Rate:  [67-93] 92 (05/05 0600) Cardiac Rhythm: Heart block;Normal sinus rhythm (05/05 0400) Resp:  [10-26] 26 (05/05 0600) BP: (89-126)/(60-92) 115/75 (05/05 0600) SpO2:  [90 %-99 %] 93 % (05/05 0600) Weight:  [74.4 kg] 74.4 kg (05/05 0500)  Hemodynamic parameters for last 24 hours:    Intake/Output from previous day: 05/04 0701 - 05/05 0700 In: 629.8 [P.O.:385; I.V.:0.4; IV Piggyback:194.5] Out: 2270 [Urine:2270] Intake/Output this shift: No intake/output data recorded.  General appearance: alert and cooperative Neurologic: intact Heart: regular rate and rhythm Lungs: clear to auscultation bilaterally Extremities: no edema Wound: incision healing well  Lab Results: Recent Labs    07/04/23 1554 07/05/23 0323  WBC 11.4* 11.6*  HGB 10.9* 11.4*  HCT 33.2* 36.0*  PLT 126* 125*   BMET:  Recent Labs    07/04/23 1554 07/05/23 0323  NA 136 136  K 3.8 3.7  CL 102 100  CO2 27 27  GLUCOSE 123* 124*  BUN 11 11  CREATININE 1.03 1.07  CALCIUM  7.5* 7.9*    PT/INR:  Recent Labs    07/06/23 0449  LABPROT 15.1  INR 1.2   ABG    Component Value Date/Time   PHART 7.336 (L) 07/03/2023 1734   HCO3 21.7 07/03/2023 1734   TCO2 23 07/03/2023 1734   ACIDBASEDEF 4.0 (H) 07/03/2023 1734   O2SAT 98 07/03/2023 1734   CBG (last 3)  Recent Labs    07/05/23 2005 07/05/23 2326 07/06/23 0354  GLUCAP 155* 113* 107*    Assessment/Plan: S/P Procedure(s) (LRB): CORONARY ARTERY BYPASS GRAFTING X 3, USING LEFT INTERNAL MAMMARY ARTERY AND ENDOSCOPIC  VEIN HARVEST RIGHT GREATER SAPHENOUS VEIN (N/A) ECHOCARDIOGRAM, TRANSESOPHAGEAL, INTRAOPERATIVE (N/A)  POD 3 Hemodynamically stable. Will start low dose Lopressor .  -1600 yesterday and wt about at preop. No further diuresis needed.  Protein S def with hx of DVT and PE on chronic Coumadin . Will continue 4 mg daily until INR bumps and then resume home dose of alternating 4mg /2mg .   DM: glucose under good control. Continue SSI.   DC sleeve and foley.  Transfer to 4E and continue IS, ambulation.  Plan home Wed.   LOS: 3 days    Paul Ryan 07/06/2023

## 2023-07-07 LAB — PROTIME-INR
INR: 1.2 (ref 0.8–1.2)
Prothrombin Time: 14.9 s (ref 11.4–15.2)

## 2023-07-07 LAB — GLUCOSE, CAPILLARY
Glucose-Capillary: 94 mg/dL (ref 70–99)
Glucose-Capillary: 104 mg/dL — ABNORMAL HIGH (ref 70–99)
Glucose-Capillary: 152 mg/dL — ABNORMAL HIGH (ref 70–99)
Glucose-Capillary: 117 mg/dL — ABNORMAL HIGH (ref 70–99)

## 2023-07-07 MED ORDER — WARFARIN SODIUM 2 MG PO TABS: 4.0000 mg | ORAL_TABLET | Freq: Once | ORAL | Status: DC

## 2023-07-07 MED ORDER — WARFARIN SODIUM 2 MG PO TABS
4.0000 mg | ORAL_TABLET | Freq: Once | ORAL | Status: AC
  Administered 2023-07-07: 4 mg via ORAL
  Filled 2023-07-07: qty 2

## 2023-07-07 MED ORDER — LIVING WELL WITH DIABETES BOOK
Freq: Once | Status: AC
  Filled 2023-07-07: qty 1

## 2023-07-07 MED FILL — Lidocaine HCl Local Soln Prefilled Syringe 100 MG/5ML (2%): INTRAMUSCULAR | Qty: 5 | Status: AC

## 2023-07-07 MED FILL — Sodium Bicarbonate IV Soln 8.4%: INTRAVENOUS | Qty: 50 | Status: AC

## 2023-07-07 MED FILL — Mannitol IV Soln 20%: INTRAVENOUS | Qty: 500 | Status: AC

## 2023-07-07 MED FILL — Sodium Chloride IV Soln 0.9%: INTRAVENOUS | Qty: 2000 | Status: AC

## 2023-07-07 MED FILL — Heparin Sodium (Porcine) Inj 1000 Unit/ML: INTRAMUSCULAR | Qty: 10 | Status: AC

## 2023-07-07 MED FILL — Electrolyte-R (PH 7.4) Solution: INTRAVENOUS | Qty: 3000 | Status: AC

## 2023-07-07 NOTE — Progress Notes (Signed)
 Mobility Specialist Progress Note:   07/07/23 1036  Mobility  Activity Ambulated independently in hallway;Ambulated independently in room;Ambulated independently to bathroom  Level of Assistance Independent  Assistive Device None  Distance Ambulated (ft) 1025 ft  RUE Weight Bearing Per Provider Order NWB  LUE Weight Bearing Per Provider Order NWB  Activity Response Tolerated well  Mobility Referral Yes  Mobility visit 1 Mobility  Mobility Specialist Start Time (ACUTE ONLY) 1026  Mobility Specialist Stop Time (ACUTE ONLY) 1036  Mobility Specialist Time Calculation (min) (ACUTE ONLY) 10 min   Pt received exiting bathroom, agreeable to mobility session. Ambulated with MS independently in room and hallway, following all sternal precautions. Tolerated well, asx throughout. Eager for d/c. Returned pt to room, left with all needs met.    Avett Reineck Mobility Specialist Please contact via Special educational needs teacher or  Rehab office at [phone]

## 2023-07-07 NOTE — Progress Notes (Addendum)
 4 Days Post-Op Procedure(s) (LRB): CORONARY ARTERY BYPASS GRAFTING X 3, USING LEFT INTERNAL MAMMARY ARTERY AND ENDOSCOPIC VEIN HARVEST RIGHT GREATER SAPHENOUS VEIN (N/A) ECHOCARDIOGRAM, TRANSESOPHAGEAL, INTRAOPERATIVE (N/A) Subjective: Some neck discomfort- chronic issues  Objective: Vital signs in last 24 hours: Temp:  [97.4 F (36.3 C)-99.1 F (37.3 C)] 97.7 F (36.5 C) (05/06 0327) Pulse Rate:  [63-126] 66 (05/06 0423) Cardiac Rhythm: Normal sinus rhythm (05/05 2030) Resp:  [15-20] 20 (05/06 0423) BP: (94-129)/(57-92) 111/92 (05/06 0327) SpO2:  [92 %-100 %] 93 % (05/06 0423) Weight:  [73.8 kg] 73.8 kg (05/06 0423)  Hemodynamic parameters for last 24 hours:    Intake/Output from previous day: 05/05 0701 - 05/06 0700 In: 840 [P.O.:840] Out: 50 [Urine:50] Intake/Output this shift: No intake/output data recorded.  General appearance: alert, cooperative, and no distress Heart: regular rate and rhythm Lungs: clear to auscultation bilaterally Abdomen: benign Extremities: no edema Wound: incis healing well  Lab Results: Recent Labs    07/04/23 1554 07/05/23 0323  WBC 11.4* 11.6*  HGB 10.9* 11.4*  HCT 33.2* 36.0*  PLT 126* 125*   BMET:  Recent Labs    07/04/23 1554 07/05/23 0323  NA 136 136  K 3.8 3.7  CL 102 100  CO2 27 27  GLUCOSE 123* 124*  BUN 11 11  CREATININE 1.03 1.07  CALCIUM  7.5* 7.9*    PT/INR:  Recent Labs    07/06/23 0449  LABPROT 15.1  INR 1.2   ABG    Component Value Date/Time   PHART 7.336 (L) 07/03/2023 1734   HCO3 21.7 07/03/2023 1734   TCO2 23 07/03/2023 1734   ACIDBASEDEF 4.0 (H) 07/03/2023 1734   O2SAT 98 07/03/2023 1734   CBG (last 3)  Recent Labs    07/06/23 1634 07/06/23 2114 07/07/23 0627  GLUCAP 102* 118* 94    Meds Scheduled Meds:  acetaminophen   1,000 mg Oral Q6H   Or   acetaminophen  (TYLENOL ) oral liquid 160 mg/5 mL  1,000 mg Per Tube Q6H   aspirin  EC  81 mg Oral Daily   DULoxetine  60 mg Oral q AM    insulin  aspart  0-24 Units Subcutaneous TID WC   metoprolol  tartrate  12.5 mg Oral BID   pantoprazole   40 mg Oral QAC breakfast   rosuvastatin   10 mg Oral QPM   senna-docusate  1 tablet Oral BID   sodium chloride  flush  3 mL Intravenous Q12H   thyroid   120 mg Oral Q Sun   thyroid   60 mg Oral Once per day on Monday Tuesday Wednesday Thursday Friday Saturday   Warfarin - Physician Dosing Inpatient   Does not apply q1600   Continuous Infusions:  sodium chloride      promethazine  (PHENERGAN ) injection (IM or IVPB)     PRN Meds:.sodium chloride , mouth rinse, promethazine  (PHENERGAN ) injection (IM or IVPB), sodium chloride  flush  Xrays No results found.  Assessment/Plan: S/P Procedure(s) (LRB): CORONARY ARTERY BYPASS GRAFTING X 3, USING LEFT INTERNAL MAMMARY ARTERY AND ENDOSCOPIC VEIN HARVEST RIGHT GREATER SAPHENOUS VEIN (N/A) ECHOCARDIOGRAM, TRANSESOPHAGEAL, INTRAOPERATIVE (N/A) POD#4  1 afeb, VSS, S BP 90's-120's 2 O2 sats good on RA 3 weight below preop, voiding, not all recorded 4 BS well controlled- no DM meds preop 5 INR 1.2- cont coumadin  at 4 mg then home dosing 6 conts with good overall progress , cont routine rehab and pulm hygiene- poss home in am if no new issues   LOS: 4 days    Wayne E Gold PA-C Pager  [phone] 07/07/2023   Chart reviewed, patient examined, agree with above.  Should be able to go home tomorrow and resume his previous Coumadin  dose.

## 2023-07-07 NOTE — Progress Notes (Signed)
 Nutrition Education Note  RD consulted for nutrition education regarding diabetes.   Lab Results  Component Value Date   HGBA1C 6.3 (H) 07/01/2023    RD provided "Carbohydrate Counting for People with Diabetes" and "Plate Method for Diabetes" handout from the Academy of Nutrition and Dietetics. Discussed different food groups and their effects on blood sugar, emphasizing carbohydrate-containing foods. Provided list of carbohydrates and recommended serving sizes of common foods.  Discussed importance of controlled and consistent carbohydrate intake throughout the day. Provided examples of ways to balance meals/snacks. Encouraged the intake of protein with a carbohydrate to assist in the digestion; provided examples. Reviewed pt beverage choices at home; pt often does not drink sugar sweetened beverages at home. Teach back method used.  Expect good compliance.  Body mass index is 22.69 kg/m. Pt meets criteria for normal based on current BMI.  Current diet order is Heart Healthy/Carb Modified, patient is consuming approximately 5-100% of meals at this time. Labs and medications reviewed.   RD placed referral to [facility] for ongoing education outpatient after discharged. Pt appreciative of RD services.   No further nutrition interventions warranted at this time. If additional nutrition issues arise, please re-consult RD.   Doneta Furbish RD, LDN Clinical Dietitian

## 2023-07-07 NOTE — Discharge Instructions (Signed)
Plate Method for Diabetes   Foods with carbohydrates make your blood glucose level go up. The plate method is a simple way to meal plan and control the amount of carbohydrate you eat.         Use the following guidance to build a healthy plate to control carbohydrates. [name] a 9-inch plate into 3 sections, and consider your beverage the 4th section of your meal: Food Group Examples of Foods/Beverages for This Section of your Meal  Section 1: Non-starchy vegetables Fill  of your plate to include non-starchy vegetables Asparagus, broccoli, brussels sprouts, cabbage, carrots, cauliflower, celery, cucumber, green beans, mushrooms, peppers, salad greens, tomatoes, or zucchini.  Section 2: Protein foods Fill  of your plate to include a lean protein Lean meat, poultry, fish, seafood, cheese, eggs, lean deli meat, tofu, beans, lentils, nuts or nut butters.  Section 3: Carbohydrate foods Fill  of your plate to include carbohydrate foods Whole grains, whole wheat bread, brown rice, whole grain pasta, polenta, corn tortillas, fruit, or starchy vegetables (potatoes, green peas, corn, beans, acorn squash, and butternut squash). One cup of milk also counts as a food that contains carbohydrate.  Section 4: Beverage Choose water or a low-calorie drink for your beverage. Unsweetened tea, coffee, or flavored/sparkling water without added sugar.  Image reprinted with permission from The American Diabetes Association.  Copyright 2022 by the American Diabetes Association.   Copyright 2022  Academy of Nutrition and Dietetics. All rights reserved   

## 2023-07-07 NOTE — Progress Notes (Signed)
 CARDIAC REHAB PHASE I    Pt resting in bed, feeling well today. Pt ambulating independently using front wheel walker for safety and balance. Pt has ambulated in hallway two times this am. Verbalized good understanding of proper sternal precautions. Post [facility] education including site care, restrictions, heart healthy diabetic diet, sternal precautions, IS use at home, home needs at discharge, exercise guidelines and CRP2 reviewed. All questions and concerns addressed. Will refer to [facility] for CRP2. Will continue to follow.   [id number] Ronny Colas, RN BSN 07/07/2023 9:28 AM

## 2023-07-08 LAB — PROTIME-INR
INR: 1.4 — ABNORMAL HIGH (ref 0.8–1.2)
Prothrombin Time: 17 s — ABNORMAL HIGH (ref 11.4–15.2)

## 2023-07-08 LAB — GLUCOSE, CAPILLARY
Glucose-Capillary: 108 mg/dL — ABNORMAL HIGH (ref 70–99)
Glucose-Capillary: 123 mg/dL — ABNORMAL HIGH (ref 70–99)

## 2023-07-08 MED ORDER — ACETAMINOPHEN 325 MG PO TABS: 650.0000 mg | ORAL_TABLET | Freq: Four times a day (QID) | ORAL | Status: AC | PRN

## 2023-07-08 MED ORDER — OXYCODONE HCL 5 MG PO TABS: 5.0000 mg | ORAL_TABLET | Freq: Four times a day (QID) | ORAL | 0 refills | Status: AC | PRN

## 2023-07-08 MED ORDER — METOPROLOL TARTRATE 25 MG PO TABS: 12.5000 mg | ORAL_TABLET | Freq: Two times a day (BID) | ORAL | 1 refills | Status: DC

## 2023-07-08 MED ORDER — ASPIRIN 81 MG PO TBEC: 81.0000 mg | DELAYED_RELEASE_TABLET | Freq: Every day | ORAL | Status: AC

## 2023-07-08 MED ORDER — ROSUVASTATIN CALCIUM 10 MG PO TABS: 10.0000 mg | ORAL_TABLET | Freq: Every evening | ORAL | Status: AC

## 2023-07-08 NOTE — Progress Notes (Signed)
 Pacer wires removed per order, Vitals taken

## 2023-07-08 NOTE — Progress Notes (Addendum)
 5 Days Post-Op Procedure(s) (LRB): CORONARY ARTERY BYPASS GRAFTING X 3, USING LEFT INTERNAL MAMMARY ARTERY AND ENDOSCOPIC VEIN HARVEST RIGHT GREATER SAPHENOUS VEIN (N/A) ECHOCARDIOGRAM, TRANSESOPHAGEAL, INTRAOPERATIVE (N/A) Subjective: Feels well, some chronic neck discomfort  Objective: Vital signs in last 24 hours: Temp:  [97.7 F (36.5 C)-98.9 F (37.2 C)] 98.9 F (37.2 C) (05/07 0358) Pulse Rate:  [66-92] 68 (05/07 0358) Cardiac Rhythm: Normal sinus rhythm;Heart block (05/06 1900) Resp:  [18-20] 19 (05/07 0358) BP: (90-122)/(68-81) 122/80 (05/07 0358) SpO2:  [94 %-97 %] 94 % (05/07 0358) Weight:  [72.8 kg] 72.8 kg (05/07 0358)  Hemodynamic parameters for last 24 hours:    Intake/Output from previous day: No intake/output data recorded. Intake/Output this shift: No intake/output data recorded.  General appearance: alert, cooperative, and no distress Heart: regular rate and rhythm Lungs: clear to auscultation bilaterally Abdomen: benign Extremities: no edema Wound: incis healing well  Lab Results: No results for input(s): "WBC", "HGB", "HCT", "PLT" in the last 72 hours. BMET: No results for input(s): "NA", "K", "CL", "CO2", "GLUCOSE", "BUN", "CREATININE", "CALCIUM " in the last 72 hours.  PT/INR:  Recent Labs    07/08/23 0307  LABPROT 17.0*  INR 1.4*   ABG    Component Value Date/Time   PHART 7.336 (L) 07/03/2023 1734   HCO3 21.7 07/03/2023 1734   TCO2 23 07/03/2023 1734   ACIDBASEDEF 4.0 (H) 07/03/2023 1734   O2SAT 98 07/03/2023 1734   CBG (last 3)  Recent Labs    07/07/23 1613 07/07/23 2108 07/08/23 0654  GLUCAP 152* 117* 108*    Meds Scheduled Meds:  acetaminophen   1,000 mg Oral Q6H   Or   acetaminophen  (TYLENOL ) oral liquid 160 mg/5 mL  1,000 mg Per Tube Q6H   aspirin  EC  81 mg Oral Daily   DULoxetine  60 mg Oral q AM   insulin  aspart  0-24 Units Subcutaneous TID WC   metoprolol  tartrate  12.5 mg Oral BID   pantoprazole   40 mg Oral QAC  breakfast   rosuvastatin   10 mg Oral QPM   senna-docusate  1 tablet Oral BID   sodium chloride  flush  3 mL Intravenous Q12H   thyroid   120 mg Oral Q Sun   thyroid   60 mg Oral Once per day on Monday Tuesday Wednesday Thursday Friday Saturday   Warfarin - Physician Dosing Inpatient   Does not apply q1600   Continuous Infusions:  promethazine  (PHENERGAN ) injection (IM or IVPB)     PRN Meds:.mouth rinse, promethazine  (PHENERGAN ) injection (IM or IVPB), sodium chloride  flush  Xrays No results found.  Assessment/Plan: S/P Procedure(s) (LRB): CORONARY ARTERY BYPASS GRAFTING X 3, USING LEFT INTERNAL MAMMARY ARTERY AND ENDOSCOPIC VEIN HARVEST RIGHT GREATER SAPHENOUS VEIN (N/A) ECHOCARDIOGRAM, TRANSESOPHAGEAL, INTRAOPERATIVE (N/A) POD #5  1 afeb, VSS, sinus rhythm 2 sats good on RA 3 weight below preop- no need for diuretics currently 4 INR 1.4 give 4 mg today and resume home dosing- he monitors at home Plus baby ASA 5 stable for discharge, reviewed instructions    LOS: 5 days    Lindi Revering PA-C Pager [phone] 07/08/2023   Chart reviewed, patient examined, agree with above.  He looks good. Plan to send home today on previous Coumadin  dose and ASA 81 mg.

## 2023-07-08 NOTE — Care Management Important Message (Signed)
 Important Message  Patient Details  Name: Paul Ryan MRN: [id number] Date of Birth: [birth year]   Important Message Given:  Yes - Medicare IM     Janith Melnick 07/08/2023, 11:13 AM

## 2023-07-08 NOTE — Progress Notes (Signed)
 CARDIAC REHAB PHASE I   Postop [facility] education completed. Referral for CRP2 sent to [facility]. Plan for discharge home later today.    [phone] Ronny Colas, RN BSN 07/08/2023 9:38 AM

## 2023-07-08 NOTE — Progress Notes (Signed)
 Discharge instructions reviewed with pt, his wife and his daughter.  Copy of instructions given to pt. Pt informed his scripts were sent to his pharmacy for pick up.  Pt will be d/c'd via wheelchair with belongings, with family and will be       escorted by hospital volunteer.   Osmin Welz,RN SWOT   Pt has a skin tear on his right lateral back, current dressing peeling off. Dressing removed, new foam dressing applied. Area pink/red, healthy appearing and healing well. Dressings provided for care at home, discussed with pt/wife/daughter.

## 2023-07-08 NOTE — TOC Transition Note (Signed)
 Transition of Care (TOC) - Discharge Note Sherin Dingwall RN, BSN Transitions of Care Unit 4E- RN Case Manager See Treatment Team for direct phone #   Patient Details  Name: Paul Ryan MRN: [id number] Date of Birth: 07/01/1945  Transition of Care [facility]) CM/SW Contact:  Rox Cope, RN Phone Number: 07/08/2023, 11:37 AM   Clinical Narrative:    Pt stable for transition home today, per Cardiac Rehab pt needs RW for home. Order has been placed.   Pt does not have any preference for DME provider- CM contacted in-house provider- [employer] for DME needs. RW to be delivered to pt prior to discharge.   Pt will have transport after lunch.   No further TOC needs noted.    Final next level of care: Home/Self Care Barriers to Discharge: Barriers Resolved   Patient Goals and CMS Choice Patient states their goals for this hospitalization and ongoing recovery are:: return home   Choice offered to / list presented to : Patient      Discharge Placement                 Home      Discharge Plan and Services Additional resources added to the After Visit Summary for     Discharge Planning Services: CM Consult Post Acute Care Choice: Durable Medical Equipment          DME Arranged: Otho Blitz rolling DME Agency: [facility] Date DME Agency Contacted: 07/08/23 Time DME Agency Contacted: [id number] Representative spoke with at DME Agency: Gladys Lamp HH Arranged: NA HH Agency: NA        Social Drivers of Health (SDOH) Interventions SDOH Screenings   Food Insecurity: No Food Insecurity (07/03/2023)  Housing: Low Risk  (07/03/2023)  Transportation Needs: No Transportation Needs (07/03/2023)  Utilities: Not At Risk (07/03/2023)  Alcohol Screen: Low Risk  (03/03/2023)  Depression (PHQ2-9): Low Risk  (03/03/2023)  Financial Resource Strain: Low Risk  (03/03/2023)  Physical Activity: Sufficiently Active (03/03/2023)  Social Connections: Moderately Isolated (07/03/2023)  Stress: No Stress Concern  Present (03/03/2023)  Tobacco Use: Medium Risk (07/03/2023)  Health Literacy: Adequate Health Literacy (03/03/2023)     Readmission Risk Interventions    07/08/2023   11:37 AM  Readmission Risk Prevention Plan  Post Dischage Appt Complete  Medication Screening Complete  Transportation Screening Complete

## 2023-07-09 ENCOUNTER — Telehealth: Payer: Self-pay | Admitting: *Deleted

## 2023-07-09 NOTE — Transitions of Care (Post Inpatient/ED Visit) (Signed)
   07/09/2023  Name: Paul Ryan MRN: [id number] DOB: 1945-08-06  Today's TOC FU Call Status: Today's TOC FU Call Status:: Unsuccessful Call (1st Attempt) Unsuccessful Call (1st Attempt) Date: 07/09/23  Attempted to reach the patient regarding the most recent Inpatient visit; left HIPAA compliant voice message requesting call back  Follow Up Plan: Additional outreach attempts will be made to reach the patient to complete the Transitions of Care (Post Inpatient visit) call.   Pls call/ message for questions,  Vasilia Dise Mckinney Napolean Sia, RN, BSN, CCRN Alumnus RN Care Manager  Transitions of Care  [facility] [phone]: direct office

## 2023-07-10 ENCOUNTER — Encounter: Payer: Self-pay | Admitting: Family Medicine

## 2023-07-10 ENCOUNTER — Encounter: Payer: Self-pay | Admitting: Surgery

## 2023-07-10 ENCOUNTER — Telehealth: Payer: Self-pay | Admitting: *Deleted

## 2023-07-10 DIAGNOSIS — I25810 Atherosclerosis of coronary artery bypass graft(s) without angina pectoris: Secondary | ICD-10-CM | POA: Diagnosis not present

## 2023-07-10 NOTE — Discharge Summary (Signed)
 [address]       [place] [address]             [phone]    Physician Discharge Summary  Patient ID: Paul Ryan MRN: [id number] DOB/AGE: 05-12-1945 78 y.o.  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   CAD (coronary artery disease) 06/23/2023   B12 deficiency 12/11/2022   Patellofemoral arthritis of right knee 10/14/2022   De Quervain's tenosynovitis, left 09/01/2022   Hordeolum externum of right lower eyelid 06/11/2022   Type 2 diabetes mellitus with hyperlipidemia ([name]) 04/20/2022   BPH (benign prostatic hyperplasia) 09/30/2020   Neck pain 09/30/2020   Essential tremor 08/18/2020   Cervical radiculopathy 12/06/2019   Idiopathic peripheral neuropathy 11/29/2019   Olecranon bursitis of right elbow 05/19/2019   Flank pain 11/05/2017   Erectile dysfunction 12/28/2016   Anxiety 12/01/2016   MCI (mild cognitive impairment) 07/22/2016   Insomnia 04/24/2016   Urinary frequency 04/24/2016   Acid reflux 02/03/2016   Degenerative arthritis of finger, 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 04/01/2015   Dyspnea 12/29/2014   Hyperglycemia 12/26/2014   Pulmonary embolism ([name]) 08/07/2014   Polymyalgia rheumatica ([name]) 04/25/2014   Chronic anticoagulation 06/14/2013   Leukocytosis 05/29/2013   Greater trochanteric bursitis of right hip 05/16/2013   Vitamin D  deficiency 10/14/2011   Hypothyroid 06/10/2011   Hereditary protein S deficiency ([name]) 05/16/2011   Left hip pain 12/17/2010   Encounter for counseling 12/17/2010   DVT (deep venous thrombosis) ([name])    Allergy    Hyperlipidemia      Discharge Diagnoses:  Patient Active Problem List   Diagnosis Date Noted   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   CAD (coronary artery disease) 06/23/2023   B12 deficiency 12/11/2022   Patellofemoral arthritis of right knee 10/14/2022   De Quervain's  tenosynovitis, left 09/01/2022   Hordeolum externum of right lower eyelid 06/11/2022   Type 2 diabetes mellitus with hyperlipidemia ([name]) 04/20/2022   BPH (benign prostatic hyperplasia) 09/30/2020   Neck pain 09/30/2020   Essential tremor 08/18/2020   Cervical radiculopathy 12/06/2019   Idiopathic peripheral neuropathy 11/29/2019   Olecranon bursitis of right elbow 05/19/2019   Flank pain 11/05/2017   Erectile dysfunction 12/28/2016   Anxiety 12/01/2016   MCI (mild cognitive impairment) 07/22/2016   Insomnia 04/24/2016   Urinary frequency 04/24/2016   Acid reflux 02/03/2016   Degenerative arthritis of finger, 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 04/01/2015   Dyspnea 12/29/2014   Hyperglycemia 12/26/2014   Pulmonary embolism ([name]) 08/07/2014   Polymyalgia rheumatica ([name]) 04/25/2014   Chronic anticoagulation 06/14/2013   Leukocytosis 05/29/2013   Greater trochanteric bursitis of right hip 05/16/2013   Vitamin D  deficiency 10/14/2011   Hypothyroid 06/10/2011   Hereditary protein S deficiency ([name]) 05/16/2011   Left hip pain 12/17/2010   Encounter for counseling 12/17/2010   DVT (deep venous thrombosis) ([name])    Allergy    Hyperlipidemia      Discharged Condition: good  History of Present Illness:  Paul Ryan is a 78 year old gentleman with a history of hyperlipidemia, hypothyroidism, 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 He has a brother who had a heart attack and 2 stents placed in [place]. The patient had an episode of  substernal chest discomfort and called his brother to see what his chest pain symptoms were like and they matched his so he called Dr. Renna Cary. He had a PET stress test performed on 05/27/2023 which showed flow reduction in the anterior wall distribution consistent with ischemia. Left ventricular ejection fraction is 48%. He  subsequently underwent coronary angiography showing a 70 to 80% proximal LAD stenosis and 95% stenosis at a large first diagonal branch. There is about 20% in a marginal branch and the RCA had a long area of 80% proximal to mid vessel stenosis.   He was evaluated by Dr. Sherene Dilling who was in agreement the patient would be a reasonable candidate for coronary bypass grafting procedure.  The risks and benefits of the procedure were explained to the patient and she was agreeable to proceed.  Hospital Course:  Tonatiuh Mendicino presented to [facility] on 07/03/2023.  He was taken to the operating room and underwent CABG x 3 utilizing LIMA to LAD, SVG to Diagonal, and SVG to RCA.  He also underwent endoscopic vein from his right leg.  He tolerated the procedure without difficulty and was taken to the SICU in stable condition.  The patient was extubated without difficulty.  His post operative EKG showed prolonged PR interval.  This progressed to 1st degree AV Block and he was not additionally started on Lopressor  due to this.  His heart rate and rhythm did stabilize and ultimately a beta-blocker was initiated.  His chest tubes and arterial lines were removed without difficulty.  His pacing wires were removed on the day of discharge.  He was started on lasix  to facilitate diuresis.  He has a history of protein S deficiency.  He was initially treated with Lovenox , but will be transitioned to home regimen of Coumadin  prior to discharge.  He had issues with post operative nausea.  He was treated with reglan  and phenergan .  He was hypokalemic and was supplemented with IV replacement.  He made excellent progress in terms of his physical recovery send standard cardiac rehab modalities and working with the mobility team.  Incisions are healing well without evidence of infection.  He is tolerating diet.  Renal function has remained within normal limits.  He did have a mild expected acute blood loss anemia which stabilized.  Oxygen  was weaned and he maintained good saturations on room air.  Overall, at the time of discharge she was felt to be quite stable.  Consults: None  Significant Diagnostic Studies:  DG Chest Port 1 View Result Date: 07/05/2023 CLINICAL DATA:  [id number].  History of CABG x3. EXAM: PORTABLE CHEST 1 VIEW COMPARISON:  Portable chest yesterday at 5:25 a.m. FINDINGS: 5:36 a.m. interval removal left chest tube, mediastinal and pericardial drains. Swan-Ganz catheter has also been removed with the right IJ Cordis catheter introducer left in with the tip about the brachiocephalic/SVC junction. There is a trace left apical pneumothorax, new. There is increasing small left pleural effusion, stable minimal right effusion. There is increasing patchy opacity in the left base which could be atelectasis, pneumonia or aspiration. There is increased perihilar linear atelectasis on the right. Remaining lungs are clear. Stable mediastinum with aortic tortuosity and calcification. Stable CABG change. Mild cardiomegaly. No vascular congestion is seen. No new osseous abnormality. IMPRESSION: 1. Interval removal of left chest tube, mediastinal and pericardial drains. 2. New trace left apical pneumothorax. 3. Increasing small left pleural effusion, stable minimal right effusion. 4. Increasing patchy opacity in the left base which could  be atelectasis, pneumonia or aspiration. Fall Electronically Signed   By: Denman Fischer M.D.   On: 07/05/2023 06:14   ECHO INTRAOPERATIVE TEE Result Date: 07/04/2023  *INTRAOPERATIVE TRANSESOPHAGEAL REPORT *  Patient Name:   DAILIN REGIMBAL      Date of Exam: 07/03/2023 Medical Rec #:  [id number]      Height:       71.0 in Accession #:    [id number]     Weight:       163.7 lb Date of Birth:  09-25-1945      BSA:          1.94 m Patient Age:    77 years       BP:           104/75 mmHg Patient Gender: M              HR:           55 bpm. Exam Location:  Anesthesiology Transesophogeal exam was perform intraoperatively  during surgical procedure. Patient was closely monitored under general anesthesia during the entirety of examination. Indications:     CAD Native Vessel i25.10 Sonographer:     Sherline Distel Senior RDCS Performing Phys: Arlyne Lame MD Diagnosing Phys: Arlyne Lame MD Complications: No known complications during this procedure. POST-OP IMPRESSIONS     s/p CABG x3 _ Left Ventricle: LVEF unchanged. CO > 3L/min. No RWMA's. _ Right Ventricle: The right ventricle appears unchanged from pre-bypass. _ Aorta: No dissection noted after cannula removed. _ Left Atrium: The left atrium appears unchanged from pre-bypass. _ Left Atrial Appendage: The left atrial appendage appears unchanged from pre-bypass. _ Aortic Valve: The aortic valve appears unchanged from pre-bypass. _ Mitral Valve: The mitral valve appears unchanged from pre-bypass. _ Tricuspid Valve: The tricuspid valve appears unchanged from pre-bypass. _ Pulmonic Valve: The pulmonic valve appears unchanged from pre-bypass. _ Interatrial Septum: The interatrial septum appears unchanged from pre-bypass. _ Interventricular Septum: The interventricular septum appears unchanged from pre-bypass. _ Pericardium: The pericardium appears unchanged from pre-bypass. PRE-OP FINDINGS  Left Ventricle: The left ventricle has normal systolic function, with an ejection fraction of 60-65%. The cavity size was normal. No evidence of left ventricular regional wall motion abnormalities. Left ventricular diastolic function was not evaluated. Right Ventricle: The right ventricle has normal systolic function. The cavity was normal. There is no increase in right ventricular wall thickness. Catheter present in the right ventricle. There is no aneurysm seen. Left Atrium: Left atrial size was normal in size. No left atrial/left atrial appendage thrombus was detected. The left atrial appendage is well visualized and there is no evidence of thrombus present. Left atrial appendage velocity is reduced at less  than 40 cm/s. Right Atrium: Right atrial size was normal in size. Catheter present in the right atrium. Interatrial Septum: No atrial level shunt detected by color flow Doppler. There is no evidence of a patent foramen ovale. Pericardium: There is no evidence of pericardial effusion. There is no pleural effusion. Mitral Valve: The mitral valve is normal in structure. Mitral valve regurgitation is mild by color flow Doppler. The MR jet is centrally-directed. There is no evidence of mitral valve vegetation. There is no evidence of mitral stenosis. Tricuspid Valve: The tricuspid valve was normal in structure. Tricuspid valve regurgitation is trivial by color flow Doppler. The jet is directed centrally. No evidence of tricuspid stenosis is present. There is no evidence of tricuspid valve vegetation. Aortic Valve: The aortic valve is tricuspid aortic valve regurgitation  is trivial by color flow Doppler. The jet is centrally-directed. There is no stenosis of the aortic valve. There is no evidence of aortic valve vegetation. Pulmonic Valve: The pulmonic valve was normal in structure. Pulmonic valve regurgitation is trivial by color flow Doppler. Aorta: The aortic root, aortic arch and ascending aorta are normal in size and structure. Pulmonary Artery: Harlo Ligas catheter present on the right. The pulmonary artery is of normal size. Shunts: There is no evidence of an atrial septal defect. +--------------+--------++ LEFT VENTRICLE         +--------------+--------++ PLAX 2D                +--------------+--------++ LVIDd:        4.80 cm  +--------------+--------++ LVIDs:        3.10 cm  +--------------+--------++ LVOT diam:    2.40 cm  +--------------+--------++ LV SV:        70 ml    +--------------+--------++ LV SV Index:  36.08    +--------------+--------++ LVOT Area:    4.52 cm +--------------+--------++                        +--------------+--------++ +----------------+-----------++  MR Peak grad:   83.5 mmHg    +--------------+-------+ +----------------+-----------++  SHUNTS                MR Vmax:        457.00 cm/s  +--------------+-------+ +----------------+-----------++  Systemic Diam:2.40 cm MR PISA:        2.26 cm     +--------------+-------+ +----------------+-----------++ MR PISA Eff ROA:17 mm      +----------------+-----------++ MR PISA Radius: 0.60 cm     +----------------+-----------++  Arlyne Lame MD Electronically signed by Arlyne Lame MD Signature Date/Time: 07/04/2023/8:52:29 AM    Final    CT Chest Wo Contrast Result Date: 07/04/2023 CLINICAL DATA:  Ground-glass nodule right lung. EXAM: CT CHEST WITHOUT CONTRAST TECHNIQUE: Multidetector CT imaging of the chest was performed following the standard protocol without IV contrast. RADIATION DOSE REDUCTION: This exam was performed according to the departmental dose-optimization program which includes automated exposure control, adjustment of the mA and/or kV according to patient size and/or use of iterative reconstruction technique. COMPARISON:  PA Lat chest 07/01/2023, chest CTs without contrast 06/30/2022 and 06/04/2021, CTA chest 03/23/2015. FINDINGS: Cardiovascular: There are three-vessel coronary artery calcifications heaviest in the LAD. The cardiac size is normal. There is no significant pericardial fluid. Normal caliber pulmonary veins arteries The aorta is tortuous showing patchy calcification arch. There is no aortic aneurysm. Normal variant 2 vessel aortic arch with short-segment brachiobicarotid trunk with great vessels otherwise unremarkable. Mediastinum/Nodes: No enlarged mediastinal or axillary lymph nodes. Thyroid  gland, trachea, and esophagus demonstrate no significant findings. There is a small hiatal hernia Lungs/Pleura: Ill-defined 7 mm right upper lobe ground-glass nodule or scar, wasn't seen in 2017 but is unchanged since 06/04/2021 is again noted on 3:64. There are scarring changes  in the lung bases and apices, minor paraseptal emphysematous change in the apices. The central airways are widely patent. No new or further lung nodules are seen. No consolidation or effusion. Upper Abdomen: No acute abnormality. Abdominal aortic atherosclerosis. Musculoskeletal: mild osteopenia and degenerative change of the spine. No destructive bone lesions. No chest wall mass is seen. IMPRESSION: 1. Stable 7 mm right upper lobe ground-glass nodule or scar. Follow-up study recommended in 2 years. 2. Aortic and coronary artery atherosclerosis. 3. Small hiatal hernia. 4. Osteopenia and degenerative change. Aortic Atherosclerosis (ICD10-I70.0)  and Emphysema (ICD10-J43.9). Electronically Signed   By: Denman Fischer M.D.   On: 07/04/2023 07:51   DG Chest Port 1 View Result Date: 07/04/2023 CLINICAL DATA:  [id number].  Status post CABG x3. EXAM: PORTABLE CHEST 1 VIEW COMPARISON:  Portable chest yesterday at 12:59 p.m. FINDINGS: 5:25 a.m. ETT/NGT interval removal. Right IJ Swan-Ganz line has been advanced into proximal right pulmonary artery. Left chest tube with tip in the medial apex is unchanged, with no measurable pneumothorax. There is a pericardial and a mediastinal drain in place as before. Mediastinum appears stable. There are CABG changes and aortic atherosclerosis. Mild cardiomegaly. Normal caliber central vessels. Stable appearance of atelectasis in both lung bases. Small left pleural effusion appears unchanged. The remaining lungs are clear with no new abnormality or changes in overall aeration. IMPRESSION: 1. Interval removal of ETT/NGT. 2. Right IJ Swan-Ganz line has been advanced into proximal right pulmonary artery. 3. Left chest tube with tip in the medial apex, with no measurable pneumothorax. 4. Stable appearance of atelectasis in both lung bases and small left pleural effusion. 5. No new abnormality. Electronically Signed   By: Denman Fischer M.D.   On: 07/04/2023 07:33   DG Chest Port 1 View Result  Date: 07/03/2023 CLINICAL DATA:  Status post CABG. EXAM: PORTABLE CHEST 1 VIEW COMPARISON:  Chest radiograph dated 07/01/2023. FINDINGS: Right IJ Swan-Ganz catheter with tip over mediastinum to thing 12 the proximal right pulmonary artery. Endotracheal tube approximately 6 cm above the carina. Enteric tube with tip and side-port in the proximal stomach. No sided chest tube. Small left pleural effusion and left lung base atelectasis. No pneumothorax. Median sternotomy wires and CABG vascular clips. No acute osseous pathology. IMPRESSION: 1. Support apparatus as above. 2. Small left pleural effusion and left lung base atelectasis. Electronically Signed   By: Angus Bark M.D.   On: 07/03/2023 13:38   VAS US  DOPPLER PRE CABG Result Date: 07/01/2023 PREOPERATIVE VASCULAR EVALUATION Patient Name:  DEMARQUS GOON  Date of Exam:   07/01/2023 Medical Rec #: [id number]  Accession #:    [id number] Date of Birth: November 22, 1945  Patient Gender: M Patient Age:   73 years Exam Location:  [facility] Procedure:      VAS US  DOPPLER PRE CABG Referring Phys: Linder Revere --------------------------------------------------------------------------------  Indications:   Pre-CABG. Risk Factors:  Hyperlipidemia, coronary artery disease. 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 Performing Technologist: Franky Ivanoff Sturdivant-Jones RDMS, RVT  Examination Guidelines: A complete evaluation includes B-mode imaging, spectral Doppler, color Doppler, and power Doppler as needed of all accessible portions of each vessel. Bilateral testing is considered an integral part of a complete examination. Limited examinations for reoccurring indications may be performed as noted.  Right Carotid Findings: +----------+--------+--------+--------+--------+------------------+           PSV cm/sEDV cm/sStenosisDescribeComments           +----------+--------+--------+--------+--------+------------------+  CCA Prox  69      18                                         +----------+--------+--------+--------+--------+------------------+ CCA Distal61      26                      intimal thickening +----------+--------+--------+--------+--------+------------------+ ICA Prox  46      18      1-39%   calcific                   +----------+--------+--------+--------+--------+------------------+ ICA Distal63      26                                         +----------+--------+--------+--------+--------+------------------+ ECA       58      12                                         +----------+--------+--------+--------+--------+------------------+ +----------+--------+-------+----------------+------------+           PSV cm/sEDV cmsDescribe        Arm Pressure +----------+--------+-------+----------------+------------+ Subclavian93             Multiphasic, WNL             +----------+--------+-------+----------------+------------+ +---------+--------+--+--------+--+---------+ VertebralPSV cm/s41EDV cm/s11Antegrade +---------+--------+--+--------+--+---------+ Left Carotid Findings: +----------+--------+--------+--------+------------+------------------+           PSV cm/sEDV cm/sStenosisDescribe    Comments           +----------+--------+--------+--------+------------+------------------+ CCA Prox  81      27                                             +----------+--------+--------+--------+------------+------------------+ CCA Distal64      23                          intimal thickening +----------+--------+--------+--------+------------+------------------+ ICA Prox  63      27      1-39%   heterogenous                   +----------+--------+--------+--------+------------+------------------+ ICA Distal80      29                          tortuous           +----------+--------+--------+--------+------------+------------------+ ECA       58       14                                             +----------+--------+--------+--------+------------+------------------+ +----------+--------+--------+----------------+------------+ SubclavianPSV cm/sEDV cm/sDescribe        Arm Pressure +----------+--------+--------+----------------+------------+           65              Multiphasic, WNL             +----------+--------+--------+----------------+------------+ +---------+--------+--+--------+--+---------+ VertebralPSV cm/s28EDV cm/s12Antegrade +---------+--------+--+--------+--+---------+  ABI Findings: +------------------+-----+---------+ Rt Pressure (mmHg)IndexWaveform  +------------------+-----+---------+ 120                    triphasic +------------------+-----+---------+ 158               1.24 triphasic +------------------+-----+---------+ 152               1.20 triphasic +------------------+-----+---------+ +------------------+-----+---------+ Lt Pressure (mmHg)IndexWaveform  +------------------+-----+---------+ 127  triphasic +------------------+-----+---------+ 141               1.11 triphasic +------------------+-----+---------+ 133               1.05 triphasic +------------------+-----+---------+ +-------+---------------+----------------+ ABI/TBIToday's ABI/TBIPrevious ABI/TBI +-------+---------------+----------------+ Right  1.24           1.21/0.87        +-------+---------------+----------------+ Left   1.11           1.16/0.80        +-------+---------------+----------------+ Bilateral ABIs appear essentially unchanged compared to prior study on 12/30/2021. Right Doppler Findings: +-----------+--------+---------+ Site       PressureDoppler   +-----------+--------+---------+ Brachial   120     triphasic +-----------+--------+---------+ Radial             triphasic +-----------+--------+---------+ Palmar Arch        triphasic  +-----------+--------+---------+  Left Doppler Findings: +--------+--------+---------+ Site    PressureDoppler   +--------+--------+---------+ [id number]     triphasic +--------+--------+---------+ Radial          triphasic +--------+--------+---------+ Ulnar           triphasic +--------+--------+---------+   Summary: Right Carotid: Velocities in the right ICA are consistent with a 1-39% stenosis. Left Carotid: Velocities in the left ICA are consistent with a 1-39% stenosis. Vertebrals:  Bilateral vertebral arteries demonstrate antegrade flow. Subclavians: Normal flow hemodynamics were seen in bilateral subclavian              arteries. Right ABI: Resting right ankle-brachial index is within normal range. Left ABI: Resting left ankle-brachial index is within normal range. Right Upper Extremity: Doppler waveforms remain within normal limits with right radial compression. Doppler waveforms decrease 50% with right ulnar compression. Left Upper Extremity: Doppler waveforms decrease 50% with left radial compression. Doppler waveforms remain within normal limits with left ulnar compression.  Electronically signed by Genny Kid MD on 07/01/2023 at 8:29:38 PM.    Final    DG Chest 2 View Result Date: 07/01/2023 CLINICAL DATA:  Preop chest exam.  Upcoming CABG. EXAM: CHEST - 2 VIEW COMPARISON:  Chest radiograph 02/08/2015.  CT 06/13/2023 FINDINGS: The cardiomediastinal contours are normal. Aortic atherosclerosis. Pulmonary vasculature is normal. No consolidation, pleural effusion, or pneumothorax. No acute osseous abnormalities are seen. IMPRESSION: No active cardiopulmonary disease. Electronically Signed   By: Chadwick Colonel M.D.   On: 07/01/2023 11:44   ECHOCARDIOGRAM COMPLETE Result Date: 06/11/2023    ECHOCARDIOGRAM REPORT   Patient Name:   LIO GUESS  Date of Exam: 06/11/2023 Medical Rec #:  [id number]  Height:       71.0 in Accession #:    [id number] Weight:       171.0 lb Date of Birth:   1946/01/15  BSA:          1.973 m Patient Age:    77 years   BP:           99/67 mmHg Patient Gender: M          HR:           51 bpm. Exam Location:  Inpatient Procedure: 2D Echo, Cardiac Doppler and Color Doppler (Both Spectral and Color            Flow Doppler were utilized during procedure). Indications:    Z01.818 Encounter for other preprocedural examination.; I25.110                 Atherosclerotic heart disease of native coronary artery with  unstable angina pectoris  History:        Patient has prior history of Echocardiogram examinations, most                 recent 03/01/2018. CAD, Signs/Symptoms:Dyspnea and Shortness of                 Breath; Risk Factors:Hypertension and Diabetes. Pulmonary                 embolus.  Sonographer:    Raynelle Callow RDCS Referring Phys: [id number] Alberto Hughes [facility]  Sonographer Comments: Suboptimal subcostal window. Patient post op with arm across chest. IMPRESSIONS  1. Left ventricular ejection fraction, by estimation, is 55 to 60%. The left ventricle has normal function. The left ventricle has no regional wall motion abnormalities. Left ventricular diastolic parameters are indeterminate.  2. Right ventricular systolic function is normal. The right ventricular size is normal. Tricuspid regurgitation signal is inadequate for assessing PA pressure.  3. The mitral valve is degenerative. Trivial mitral valve regurgitation. No evidence of mitral stenosis.  4. The aortic valve is tricuspid. Aortic valve regurgitation is trivial. Aortic valve sclerosis is present, with no evidence of aortic valve stenosis.  5. Aortic dilatation noted. There is dilatation of the aortic root, measuring 40 mm. FINDINGS  Left Ventricle: Left ventricular ejection fraction, by estimation, is 55 to 60%. The left ventricle has normal function. The left ventricle has no regional wall motion abnormalities. The left ventricular internal cavity size was normal in size. There is  no left ventricular  hypertrophy. Left ventricular diastolic parameters are indeterminate. Right Ventricle: The right ventricular size is normal. No increase in right ventricular wall thickness. Right ventricular systolic function is normal. Tricuspid regurgitation signal is inadequate for assessing PA pressure. Left Atrium: Left atrial size was normal in size. Right Atrium: Right atrial size was normal in size. Pericardium: There is no evidence of pericardial effusion. Mitral Valve: The mitral valve is degenerative in appearance. Mild to moderate mitral annular calcification. Trivial mitral valve regurgitation. No evidence of mitral valve stenosis. Tricuspid Valve: The tricuspid valve is normal in structure. Tricuspid valve regurgitation is not demonstrated. No evidence of tricuspid stenosis. Aortic Valve: The aortic valve is tricuspid. Aortic valve regurgitation is trivial. Aortic valve sclerosis is present, with no evidence of aortic valve stenosis. Pulmonic Valve: The pulmonic valve was normal in structure. Pulmonic valve regurgitation is not visualized. No evidence of pulmonic stenosis. Aorta: Aortic dilatation noted. There is dilatation of the aortic root, measuring 40 mm. IAS/Shunts: No atrial level shunt detected by color flow Doppler.  LEFT VENTRICLE PLAX 2D LVIDd:         4.05 cm     Diastology LVIDs:         2.60 cm     LV e' medial:    7.07 cm/s LV PW:         0.95 cm     LV E/e' medial:  10.9 LV IVS:        0.90 cm     LV e' lateral:   11.00 cm/s LVOT diam:     2.20 cm     LV E/e' lateral: 7.0 LV SV:         77 LV SV Index:   39 LVOT Area:     3.80 cm  LV Volumes (MOD) LV vol d, MOD A2C: 68.6 ml LV vol d, MOD A4C: 95.6 ml LV vol s, MOD A2C: 25.7 ml LV vol s, MOD  A4C: 35.2 ml LV SV MOD A2C:     42.9 ml LV SV MOD A4C:     95.6 ml LV SV MOD BP:      48.7 ml RIGHT VENTRICLE             IVC RV S prime:     11.20 cm/s  IVC diam: 1.10 cm TAPSE (M-mode): 2.2 cm LEFT ATRIUM             Index        RIGHT ATRIUM           Index LA  diam:        3.10 cm 1.57 cm/m   RA Area:     10.30 cm LA Vol (A2C):   42.4 ml 21.49 ml/m  RA Volume:   19.70 ml  9.99 ml/m LA Vol (A4C):   17.6 ml 8.92 ml/m LA Biplane Vol: 28.4 ml 14.40 ml/m  AORTIC VALVE LVOT Vmax:   94.00 cm/s LVOT Vmean:  59.000 cm/s LVOT VTI:    0.203 m  AORTA Ao Root diam: 4.00 cm Ao Asc diam:  3.80 cm MITRAL VALVE MV Area (PHT): 3.37 cm    SHUNTS MV Decel Time: 225 msec    Systemic VTI:  0.20 m MV E velocity: 77.00 cm/s  Systemic Diam: 2.20 cm MV A velocity: 77.00 cm/s MV E/A ratio:  1.00 Kardie Tobb DO Electronically signed by Jerryl Morin DO Signature Date/Time: 06/11/2023/4:42:10 PM    Final    CARDIAC CATHETERIZATION Result Date: 06/11/2023   1st Diag lesion is 95% stenosed.   Prox LAD lesion is 70% stenosed.   3rd Mrg lesion is 20% stenosed.   Prox RCA lesion is 80% stenosed. 1.  Severe multivessel disease consisting of proximal LAD, first diagonal, and proximal right coronary artery lesions. 2.  LVEDP of 16 mmHg. Recommendation: Cardiothoracic surgical consultation.  Results reviewed with patient's wife.    Results for orders placed or performed during the hospital encounter of 07/03/23 (from the past 72 hours)  Protime-INR     Status: None   Collection Time: 07/07/23  8:47 AM  Result Value Ref Range   Prothrombin Time 14.9 11.4 - 15.2 seconds   INR 1.2 0.8 - 1.2    Comment: (NOTE) INR goal varies based on device and disease states. Performed at [facility], [address], [place], [place] [address]   Glucose, capillary     Status: Abnormal   Collection Time: 07/07/23  1:09 PM  Result Value Ref Range   Glucose-Capillary 104 (H) 70 - 99 mg/dL    Comment: Glucose reference range applies only to samples taken after fasting for at least 8 hours.  Glucose, capillary     Status: Abnormal   Collection Time: 07/07/23  4:13 PM  Result Value Ref Range   Glucose-Capillary 152 (H) 70 - 99 mg/dL    Comment: Glucose reference range applies only to samples taken  after fasting for at least 8 hours.  Glucose, capillary     Status: Abnormal   Collection Time: 07/07/23  9:08 PM  Result Value Ref Range   Glucose-Capillary 117 (H) 70 - 99 mg/dL    Comment: Glucose reference range applies only to samples taken after fasting for at least 8 hours.   Comment 1 Notify RN    Comment 2 Document in Chart   Protime-INR     Status: Abnormal   Collection Time: 07/08/23  3:07 AM  Result Value Ref  Range   Prothrombin Time 17.0 (H) 11.4 - 15.2 seconds   INR 1.4 (H) 0.8 - 1.2    Comment: (NOTE) INR goal varies based on device and disease states. Performed at [facility], [address], [place], [place] [address]   Glucose, capillary     Status: Abnormal   Collection Time: 07/08/23  6:54 AM  Result Value Ref Range   Glucose-Capillary 108 (H) 70 - 99 mg/dL    Comment: Glucose reference range applies only to samples taken after fasting for at least 8 hours.   Comment 1 Notify RN    Comment 2 Document in Chart   Glucose, capillary     Status: Abnormal   Collection Time: 07/08/23 11:05 AM  Result Value Ref Range   Glucose-Capillary 123 (H) 70 - 99 mg/dL    Comment: Glucose reference range applies only to samples taken after fasting for at least 8 hours.   *Note: Due to a large number of results and/or encounters for the requested time period, some results have not been displayed. A complete set of results can be found in Results Review.       Treatments: surgery:   07/03/2023   Surgeon:  Bartley Lightning, MD   First Assistant: Debroah Fanning,  PA-C:     Preoperative Diagnosis:  Severe multi-vessel coronary artery disease     Postoperative Diagnosis:  Same     Procedure:   Median Sternotomy Extracorporeal circulation 3.   Coronary artery bypass grafting x 3   Left internal mammary artery graft to the LAD SVG to diagonal SVG to RCA   4.   Endoscopic vein harvest from the right leg     Anesthesia:  General Endotracheal  Discharge  Exam: Blood pressure 112/75, pulse 62, temperature 98 F (36.7 C), temperature source Oral, resp. rate 15, height 5\' 11"  (1.803 m), weight 72.8 kg, SpO2 98%.   General appearance: alert, cooperative, and no distress Heart: regular rate and rhythm Lungs: clear to auscultation bilaterally Abdomen: benign Extremities: no edema Wound: incis healing well  Discharge Medications:  The patient has been discharged on:   1.Beta Blocker:  Yes [  y ]                              No   [   ]                              If No, reason:  2.Ace Inhibitor/ARB: Yes [   ]                                     No  [  n  ]                                     If No, reason:not needed  3.Statin:   Yes Davis.Dad   ]                  No  [   ]                  If No, reason:  4.Ecasa:  Yes  [ y  ]  No   [   ]                  If No, reason:  Patient had ACS upon admission:n  Plavix/P2Y12 inhibitor: Yes [   ]                                      No  [  n ]     Discharge Instructions     Amb Referral to Cardiac Rehabilitation   Complete by: As directed    Diagnosis: CABG   CABG X ___: 3   After initial evaluation and assessments completed: Virtual Based Care may be provided alone or in conjunction with Phase 2 Cardiac Rehab based on patient barriers.: Yes   Intensive Cardiac Rehabilitation (ICR) [facility] location only OR Traditional Cardiac Rehabilitation (TCR) *If criteria for ICR are not met will enroll in TCR [facility] only): Yes   Amb Referral to Nutrition and Diabetic Education   Complete by: As directed       Allergies as of 07/08/2023   No Known Allergies      Medication List     STOP taking these medications    predniSONE  10 MG tablet Commonly known as: DELTASONE        TAKE these medications    Accu-Chek Guide Test test strip Generic drug: glucose blood USE TO CHECK BLOOD SUGAR ONCE DAILY . E11.9   Accu-Chek Softclix Lancets lancets USE AS DIRECTED TO CHECK SUGAR  ONCE DAILY.E11.9   acetaminophen  325 MG tablet Commonly known as: Tylenol  Take 2 tablets (650 mg total) by mouth every 6 (six) hours as needed for mild pain (pain score 1-3).   ALPHA LIPOIC ACID PO Take 1 capsule by mouth daily.   aspirin  EC 81 MG tablet Take 1 tablet (81 mg total) by mouth daily. Swallow whole.   cetirizine  10 MG tablet Commonly known as: ZYRTEC  Take 10 mg by mouth at bedtime.   CoQ10 100 MG Caps Take 1 capsule by mouth daily.   DULoxetine  60 MG capsule Commonly known as: CYMBALTA  Take 60 mg by mouth in the morning.   enoxaparin  80 MG/0.8ML injection Commonly known as: LOVENOX  Inject 0.8 mLs (80 mg total) into the skin every 12 (twelve) hours for 3 doses. Start on 07/01/23   Magic Mushroom Mix Caps Take 1 capsule by mouth daily. 5-in-1 Mushroom Complex   MAGNESIUM  PO Take 1 capsule by mouth daily.   metoprolol  tartrate 25 MG tablet Commonly known as: LOPRESSOR  Take 0.5 tablets (12.5 mg total) by mouth 2 (two) times daily.   OneTouch Verio Flex System w/Device Kit Use to check blood sugar once a day. DX E11.9   oxyCODONE  5 MG immediate release tablet Commonly known as: Oxy IR/ROXICODONE  Take 1 tablet (5 mg total) by mouth every 6 (six) hours as needed for up to 7 days for severe pain (pain score 7-10).   pantoprazole  40 MG tablet Commonly known as: PROTONIX  TAKE 1 TABLET BY MOUTH EVERY DAY   Repatha  SureClick 140 MG/ML Soaj Generic drug: Evolocumab  Inject 140 mg into the skin every 14 (fourteen) days.   rosuvastatin  10 MG tablet Commonly known as: CRESTOR  Take 1 tablet (10 mg total) by mouth every evening.   thyroid  60 MG tablet Commonly known as: Armour Thyroid  Take 1 tablet daily on MONDAY, TUESDAY, WEDNESDAY, THURSDAY, FRIDAY, SATURDAY and 2 tablets on SUNDAYS.   VITAMIN  D-3 PO Take 1 capsule by mouth daily.   warfarin 4 MG tablet Commonly known as: COUMADIN  Take as directed. If you are unsure how to take this medication, talk to  your nurse or doctor. Original instructions: Take 0.5-1 tablets (2-4 mg total) by mouth See admin instructions. 4 mg on Sun Mon Wed Fri and 2 mg Tues Thurs and Sat   zinc gluconate 50 MG tablet Take 50 mg by mouth daily.         Signed:  Cariah Salatino E Marlet Korte, PA-C  07/10/2023, 8:38 AM

## 2023-07-10 NOTE — Transitions of Care (Post Inpatient/ED Visit) (Addendum)
 07/10/2023  Name: Paul Ryan MRN: [id number] DOB: 22-Feb-1946  Today's TOC FU Call Status: Today's TOC FU Call Status:: Successful TOC FU Call Completed TOC FU Call Complete Date: 07/10/23 Patient's Name and Date of Birth confirmed.  Transition Care Management Follow-up Telephone Call Date of Discharge: 07/08/23 Discharge Facility: [facility]) Type of Discharge: Inpatient Admission Primary Inpatient Discharge Diagnosis:: planned surgical CABG x 3 How have you been since you were released from the hospital?: Better ("I am doing fine; it was a planned surgery and I am back to my normal self; I am independent and know to call the doctors right away if I have any problems or concerns; I don't need any regular follow up calls- I will call them if problems arise") Any questions or concerns?: No  Items Reviewed: Did you receive and understand the discharge instructions provided?: Yes (thoroughly reviewed with patient who verbalizes excellent understanding of same) Medications obtained,verified, and reconciled?: Yes (Medications Reviewed) (Full medication reconciliation/ review completed; no concerns or discrepancies identified; confirmed patient obtained/ is taking all newly Rx'd medications as instructed; self-manages medications and denies questions/ concerns around medications today) Any new allergies since your discharge?: No Dietary orders reviewed?: Yes Type of Diet Ordered:: "As healthy as possible" Do you have support at home?: Yes People in Home [RPT]: child(ren), adult, spouse Name of Support/Comfort Primary Source: Reports independent in self-care activities; resides with supportive spouse and adult daughter- assists as/ if needed/ indicated  Medications Reviewed Today: Medications Reviewed Today     Reviewed by Harace Mccluney M, RN (Registered Nurse) on 07/10/23 at 1229  Med List Status: <None>   Medication Order Taking? Sig Documenting Provider Last Dose Status Informant   ACCU-CHEK GUIDE TEST test strip [id number] Yes USE TO CHECK BLOOD SUGAR ONCE DAILY . E11.9 Neda Balk, MD Taking Active Self  Accu-Chek Softclix Lancets lancets [id number] Yes USE AS DIRECTED TO CHECK SUGAR ONCE DAILY.E11.9 Neda Balk, MD Taking Active Self  acetaminophen  (TYLENOL ) 325 MG tablet [id number] Yes Take 2 tablets (650 mg total) by mouth every 6 (six) hours as needed for mild pain (pain score 1-3). Gold, Wayne E, PA-C Taking Active   ALPHA LIPOIC ACID PO [id number] Yes Take 1 capsule by mouth daily. [provider] Taking Active Self  aspirin  EC 81 MG tablet [id number] Yes Take 1 tablet (81 mg total) by mouth daily. Swallow whole. Gold, Wayne E, PA-C Taking Active   Blood Glucose Monitoring Suppl (ONETOUCH VERIO FLEX SYSTEM) w/Device KIT [id number] Yes Use to check blood sugar once a day. DX E11.9 Neda Balk, MD Taking Active Self  cetirizine  (ZYRTEC ) 10 MG tablet [id number] Yes Take 10 mg by mouth at bedtime. [provider] Taking Active Self  Cholecalciferol (VITAMIN D -3 PO) [id number] Yes Take 1 capsule by mouth daily. [provider] Taking Active Self  Coenzyme Q10 (COQ10) 100 MG CAPS [id number] Yes Take 1 capsule by mouth daily. [provider] Taking Active Self  DULoxetine  (CYMBALTA ) 60 MG capsule [id number] Yes Take 60 mg by mouth in the morning. [provider] Taking Active Self  enoxaparin  (LOVENOX ) 80 MG/0.8ML injection [id number]  Inject 0.8 mLs (80 mg total) into the skin every 12 (twelve) hours for 3 doses. Start on 07/01/23 Sumner Ends, MD  Expired 07/03/23 2359            Med Note (Tabetha Haraway M   Fri Jul 10, 2023 12:13 PM) 07/10/23- Reports during TOC call is no longer taking  Evolocumab  (REPATHA  SURECLICK) 140 MG/ML SOAJ [id number] Yes Inject 140 mg into the skin every 14 (fourteen) days. Hugh Madura, MD Taking Active Self  gabapentin  (NEURONTIN ) 100 MG capsule [id number] Yes Take 100 mg by mouth 3 (three) times  daily. 07/10/23- Reports during TOC call takes PRN for neuropathy- per neurology provider Neda Balk, MD Taking Active Self           Med Note (Margueritte Guthridge M   Fri Jul 10, 2023 12:20 PM) 07/10/23- Reports during TOC call takes PRN for neuropathy- per neurology provider   MAGNESIUM  PO [id number] Yes Take 1 capsule by mouth daily. [provider] Taking Active Self  metoprolol  tartrate (LOPRESSOR ) 25 MG tablet [id number] Yes Take 0.5 tablets (12.5 mg total) by mouth 2 (two) times daily. Gold, Wayne E, PA-C Taking Active   Misc Natural Products (MAGIC MUSHROOM MIX) CAPS [id number] Yes Take 1 capsule by mouth daily. 5-in-1 Mushroom Complex [provider] Taking Active Self  oxyCODONE  (OXY IR/ROXICODONE ) 5 MG immediate release tablet [id number] Yes Take 1 tablet (5 mg total) by mouth every 6 (six) hours as needed for up to 7 days for severe pain (pain score 7-10). Gold, Wayne E, PA-C Taking Active            Med Note (Amos Gaber M   Fri Jul 10, 2023 12:12 PM) 07/10/23- Reports during TOC call has not needed to use often post-recent CABG  pantoprazole  (PROTONIX ) 40 MG tablet [id number] Yes TAKE 1 TABLET BY MOUTH EVERY DAY Neda Balk, MD Taking Active Self  predniSONE  (DELTASONE ) 10 MG tablet [id number] Yes Take 10 mg by mouth as needed (pain). 07/10/23- Reports during TOC call takes PRN for rheumatologist- per rheumatologist provider Neda Balk, MD Taking Active Self           Med Note (Kyan Yurkovich M   Fri Jul 10, 2023 12:21 PM) 07/10/23- Reports during [facility] call takes PRN for rheumatologist- per rheumatologist provider   rosuvastatin  (CRESTOR ) 10 MG tablet [id number] Yes Take 1 tablet (10 mg total) by mouth every evening. Gold, Wayne E, PA-C Taking Active   thyroid  (ARMOUR THYROID ) 60 MG tablet [id number] Yes Take 1 tablet daily on MONDAY, TUESDAY, WEDNESDAY, THURSDAY, FRIDAY, SATURDAY and 2 tablets on SUNDAYS. Neda Balk, MD Taking Active Self  warfarin (COUMADIN ) 4 MG  tablet [id number] Yes Take 0.5-1 tablets (2-4 mg total) by mouth See admin instructions. 4 mg on Sun Mon Wed Fri and 2 mg Tues Thurs and Sat Sumner Ends, MD Taking Active Self  zinc gluconate 50 MG tablet [id number] Yes Take 50 mg by mouth daily. [provider] Taking Active Self           Home Care and Equipment/Supplies: Were Home Health Services Ordered?: No Any new equipment or medical supplies ordered?: Yes (rolling walker)- reports during TOC call, not needing to use walker at this time; reports "I am very steady in my walking" Name of Medical supply agency?: [employer] Were you able to get the equipment/medical supplies?: Yes Do you have any questions related to the use of the equipment/supplies?: No  Functional Questionnaire: Do you need assistance with bathing/showering or dressing?: No Do you need assistance with meal preparation?: No Do you need assistance with eating?: No Do you have difficulty maintaining continence: No Do you need assistance with getting out of bed/getting out of a chair/moving?: No Do you have difficulty managing or taking your medications?: No  Follow up appointments reviewed: PCP  Follow-up appointment confirmed?: Yes Date of PCP follow-up appointment?: 07/21/23 (verified this is recommended time frame for follow up per hospital discharging provider notes) Follow-up Provider: PCP- virtual visit- per PCP instruction pre-surgery Specialist Hospital Follow-up appointment confirmed?: Yes Date of Specialist follow-up appointment?: 07/24/23 (verified this is recommended time frame for follow up per hospital discharging provider notes) Follow-Up Specialty Provider:: pulmonary provider- 07/24/23; cardiology provider-07/28/23; 08/04/23- neurology provider; 08/05/23- cardiothoracic surgeon Do you need transportation to your follow-up appointment?: No Do you understand care options if your condition(s) worsen?: Yes-patient verbalized understanding  SDOH  Interventions Today    Flowsheet Row Most Recent Value  SDOH Interventions   Food Insecurity Interventions Intervention Not Indicated  Housing Interventions Intervention Not Indicated  Transportation Interventions Intervention Not Indicated  [drives self at baseline: family assisting post-recent surgery]  Utilities Interventions Intervention Not Indicated      See TOC assessment tabs for additional assessment/ TOC intervention information  Patient declines need for ongoing/ further care management/ coordination outreach; declines enrollment in 30-day TOC program- declines taking my direct phone number should needs/ concerns arise post-TOC call   Pls call/ message for questions,  Chasidy Janak Mckinney Alphonza Tramell, RN, BSN, Media planner  Transitions of Care  [facility] - [facility] [phone]: direct office

## 2023-07-13 ENCOUNTER — Encounter

## 2023-07-13 ENCOUNTER — Telehealth (HOSPITAL_COMMUNITY): Payer: Self-pay

## 2023-07-13 NOTE — Telephone Encounter (Signed)
 Pt called in regards to CR and stated he is interested. Explained scheduling process and went over insurance, patient verbalized understanding. Will contact patient for scheduling once f/u has been completed.

## 2023-07-13 NOTE — Telephone Encounter (Signed)
 Pt insurance is active and benefits verified through [facility]. Co-pay $20.00, DED $0.00/$0.00 met, out of pocket $4,000.00/$712.17 met, co-insurance 0%. No pre-authorization required. [employer], 07/13/23 @ 10:17AM, REF#[id number]   How many CR sessions are covered? (36 visits for TCR, 72 visits for ICR)72 Is this a lifetime maximum or an annual maximum? Annual Has the member used any of these services to date? No Is there a time limit (weeks/months) on start of program and/or program completion? No     Will contact patient to see if he is interested in the Cardiac Rehab Program. If interested, patient will need to complete follow up appt. Once completed, patient will be contacted for scheduling upon review by the RN Navigator.

## 2023-07-14 ENCOUNTER — Other Ambulatory Visit: Payer: Self-pay | Admitting: Family Medicine

## 2023-07-19 NOTE — Assessment & Plan Note (Signed)
 hgba1c acceptable, minimize simple carbs. Increase exercise as tolerated. Continue current meds labs reordered. . Needs protein intake every 3-4 hours while awake.

## 2023-07-19 NOTE — Assessment & Plan Note (Signed)
 Supplement and monitor

## 2023-07-19 NOTE — Assessment & Plan Note (Signed)
Stable on Armour Thyroid 

## 2023-07-19 NOTE — Assessment & Plan Note (Signed)
 Tolerating statin, encouraged heart healthy diet, avoid trans fats, minimize simple carbs and saturated fats. Increase exercise as tolerated

## 2023-07-19 NOTE — Assessment & Plan Note (Signed)
 Recently underwent CABG with Dr Sherene Dilling.

## 2023-07-19 NOTE — Assessment & Plan Note (Deleted)
 hgba1c acceptable, minimize simple carbs. Increase exercise as tolerated.

## 2023-07-20 ENCOUNTER — Telehealth: Payer: Self-pay | Admitting: *Deleted

## 2023-07-20 NOTE — Telephone Encounter (Signed)
 Patient called regarding BP. States this morning his BP was 86/67, HR 46 upon waking. He ate breakfast and rechecked BP, 98/58, HR 41. States he felt lightheaded and somewhat SOB this morning. Recent BPs's ranging from 122-112/86-71, HR between 73-59. BP while on the telephone 97/65, HR 64. Discussed with Lorrin Rotter, PA. Advised patient to hold metoprolol  for the day if SBP is less than 100 or HR is less than 60. Patient verbalized understanding.

## 2023-07-21 ENCOUNTER — Telehealth: Admitting: Family Medicine

## 2023-07-21 ENCOUNTER — Encounter: Payer: Self-pay | Admitting: Family Medicine

## 2023-07-21 ENCOUNTER — Other Ambulatory Visit: Payer: Self-pay | Admitting: Family Medicine

## 2023-07-21 VITALS — BP 118/77 | HR 60 | Ht 71.0 in | Wt 155.4 lb

## 2023-07-21 DIAGNOSIS — E1169 Type 2 diabetes mellitus with other specified complication: Secondary | ICD-10-CM

## 2023-07-21 DIAGNOSIS — R739 Hyperglycemia, unspecified: Secondary | ICD-10-CM

## 2023-07-21 DIAGNOSIS — E538 Deficiency of other specified B group vitamins: Secondary | ICD-10-CM

## 2023-07-21 DIAGNOSIS — E559 Vitamin D deficiency, unspecified: Secondary | ICD-10-CM | POA: Diagnosis not present

## 2023-07-21 DIAGNOSIS — E785 Hyperlipidemia, unspecified: Secondary | ICD-10-CM

## 2023-07-21 DIAGNOSIS — I25119 Atherosclerotic heart disease of native coronary artery with unspecified angina pectoris: Secondary | ICD-10-CM

## 2023-07-21 DIAGNOSIS — E039 Hypothyroidism, unspecified: Secondary | ICD-10-CM

## 2023-07-21 MED ORDER — TIZANIDINE HCL 2 MG PO TABS: 1.0000 mg | ORAL_TABLET | Freq: Two times a day (BID) | ORAL | 1 refills | Status: AC | PRN

## 2023-07-22 ENCOUNTER — Encounter: Payer: Self-pay | Admitting: Family Medicine

## 2023-07-22 ENCOUNTER — Ambulatory Visit: Admitting: Acute Care

## 2023-07-22 MED ORDER — EMPAGLIFLOZIN 10 MG PO TABS
10.0000 mg | ORAL_TABLET | Freq: Every day | ORAL | 3 refills | Status: DC
Start: 2023-07-22 — End: 2023-10-08

## 2023-07-22 NOTE — Progress Notes (Signed)
 MyChart Video Visit    Virtual Visit via Video Note   This patient is at least at moderate risk for complications without adequate follow up. This format is felt to be most appropriate for this patient at this time. Physical exam was limited by quality of the video and audio technology used for the visit. Porsha, CMA was able to get the patient set up on a video visit.  Patient location: home Patient and provider in visit Provider location: Office  I discussed the limitations of evaluation and management by telemedicine and the availability of in person appointments. The patient expressed understanding and agreed to proceed.  Visit Date: 07/21/2023  Today's healthcare provider: Randie Bustle, MD     Subjective:    Patient ID: Paul Ryan, male    DOB: [birth year], 78 y.o.   MRN: [id number]  Chief Complaint  Patient presents with   Medical Management of Chronic Issues    Patient presents for a 8 month follow-up.   Quality Metric Gaps    Foot & eye exam, urine microalbumin    HPI Discussed the use of AI scribe software for clinical note transcription with the patient, who gave verbal consent to proceed.  History of Present Illness Paul Ryan is a 78 year old male with prediabetes who presents for evaluation of blood sugar levels.  He has been monitoring his blood sugar levels, which have been in the prediabetic range for the past two years. Recently, he suspects his levels may have progressed to diabetes. Over the past ten days, his morning blood sugar readings have been around 101 mg/dL, with postprandial readings reaching up to 148 mg/dL.  A recent hemoglobin A1c test at the end of April was 6.3%. He also mentions a fasting blood sugar level of 124 mg/dL, though he is unsure if this reading was fasting but believes it likely was, as it was taken early in the morning.  He has a history of heart surgery and notes that his blood pressure and heart rate have been stable and within  normal ranges. He experiences muscle spasms in his neck, a condition persisting for 20-30 years, which he finds more discomforting than the heart surgery itself.  His brother has been diabetic for six years and is currently on Jardiance, a medication he tolerates well. His brother had a negative experience with metformin , which he found intolerable, though specific symptoms were not detailed.  No recent significant changes in thirst or urination. He is mindful of dietary adjustments, such as minimizing processed foods and simple carbohydrates, and ensuring regular protein intake every three to four hours to maintain stable blood sugar levels.    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   Hyperglycemia 12/26/2014   Hyperlipidemia    Hypothyroid 06/10/2011   Insomnia 04/24/2016   Left hip pain 12/17/2010   MCI (mild cognitive impairment) 07/22/2016   Nasal lesion 07/22/2016   Peripheral vascular disease ([name])    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   Polymyalgia ([name])  03/21/2014   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   "treated with [name]"   Preventative health care 12/17/2010   Shingles 1982   right abdominal wall   Sleep apnea    Staph skin infection 07/22/2016   Thyroid  nodule 11/29/2014   Urinary hesitancy 09/30/2016   Vitamin D  deficiency 10/14/2011    Past Surgical History:  Procedure Laterality Date   CATARACT EXTRACTION Bilateral    COLONOSCOPY     CORONARY ARTERY BYPASS GRAFT N/A 07/03/2023   Procedure: CORONARY ARTERY BYPASS GRAFTING X 3, USING  LEFT INTERNAL MAMMARY ARTERY AND ENDOSCOPIC VEIN HARVEST RIGHT GREATER SAPHENOUS VEIN;  Surgeon: Bartley Lightning, MD;  Location: [facility] OR;  Service: Open Heart Surgery;  Laterality: N/A;   INTRAOPERATIVE TRANSESOPHAGEAL ECHOCARDIOGRAM N/A 07/03/2023   Procedure: ECHOCARDIOGRAM, TRANSESOPHAGEAL, INTRAOPERATIVE;  Surgeon: Bartley Lightning, MD;  Location: [facility] OR;  Service: Open Heart Surgery;  Laterality: N/A;   LEFT HEART CATH AND CORONARY ANGIOGRAPHY N/A 06/11/2023   Procedure: LEFT HEART CATH AND CORONARY ANGIOGRAPHY;  Surgeon: Kyra Phy, MD;  Location: [facility] INVASIVE [facility];  Service: Cardiovascular;  Laterality: N/A;   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   4 surgeries    Family History  Problem Relation Age of Onset   Cancer Mother        unknown- everywhere   Cancer Sister        breast    Social History   Socioeconomic History   Marital status: Married    Spouse name: Not on file   Number of children: Not on file   Years of education: Not on file   Highest education level: Not on file  Occupational History   Not on file  Tobacco Use   Smoking status: Former    Current packs/day: 0.00    Average packs/day: 1 pack/day for 20.0 years (20.0 ttl pk-yrs)    Types: Cigarettes    Start date: 03/03/1960    Quit date: 03/03/1980    Years since quitting: 43.4   Smokeless tobacco: Never  Vaping Use   Vaping status: Never Used  Substance and Sexual Activity   Alcohol use: No    Alcohol/week: 0.0 standard drinks of alcohol   Drug use: No   Sexual activity: Not on file  Other Topics Concern   Not on file  Social History Narrative   Not on file   Social Drivers of Health   Financial Resource Strain: Low Risk  (03/03/2023)   Overall Financial Resource Strain (CARDIA)    Difficulty of Paying Living Expenses: Not hard at all  Food Insecurity: No Food Insecurity (07/10/2023)   Hunger Vital Sign    Worried About Running Out of Food in the Last Year: Never true    Ran Out of Food in  the Last Year: Never true  Transportation Needs: No Transportation Needs (07/10/2023)   PRAPARE - Administrator, Civil Service (Medical): No    Lack of Transportation (Non-Medical): No  Physical Activity: Sufficiently Active (03/03/2023)   Exercise Vital Sign    Days of Exercise per Week: 5 days    Minutes of Exercise per Session: 30 min  Stress: No Stress Concern Present (03/03/2023)   [employer] of Occupational Health - Occupational Stress Questionnaire    Feeling of Stress : Not at all  Social Connections: Moderately Isolated (07/03/2023)   Social Connection and Isolation Panel [NHANES]    Frequency of Communication with Friends and Family: More than three  times a week    Frequency of Social Gatherings with Friends and Family: More than three times a week    Attends Religious Services: Never    Database administrator or Organizations: No    Attends Banker Meetings: Never    Marital Status: Married  Catering manager Violence: Not At Risk (07/10/2023)   Humiliation, Afraid, Rape, and Kick questionnaire    Fear of Current or Ex-Partner: No    Emotionally Abused: No    Physically Abused: No    Sexually Abused: No    Outpatient Medications Prior to Visit  Medication Sig Dispense Refill   ACCU-CHEK GUIDE TEST test strip USE TO CHECK BLOOD SUGAR ONCE DAILY . E11.9 100 strip 1   Accu-Chek Softclix Lancets lancets USE AS DIRECTED TO CHECK SUGAR ONCE DAILY.E11.9 100 each 1   acetaminophen  (TYLENOL ) 325 MG tablet Take 2 tablets (650 mg total) by mouth every 6 (six) hours as needed for mild pain (pain score 1-3).     ALPHA LIPOIC ACID PO Take 1 capsule by mouth daily.     aspirin  EC 81 MG tablet Take 1 tablet (81 mg total) by mouth daily. Swallow whole.     Blood Glucose Monitoring Suppl (ONETOUCH VERIO FLEX SYSTEM) w/Device KIT Use to check blood sugar once a day. DX E11.9 1 kit 0   cetirizine  (ZYRTEC ) 10 MG tablet Take 10 mg by mouth at bedtime.      Cholecalciferol (VITAMIN D -3 PO) Take 1 capsule by mouth daily.     Coenzyme Q10 (COQ10) 100 MG CAPS Take 1 capsule by mouth daily.     DULoxetine  (CYMBALTA ) 60 MG capsule Take 60 mg by mouth in the morning.     Evolocumab  (REPATHA  SURECLICK) 140 MG/ML SOAJ Inject 140 mg into the skin every 14 (fourteen) days. 6 mL 3   MAGNESIUM  PO Take 1 capsule by mouth daily.     metoprolol  tartrate (LOPRESSOR ) 25 MG tablet Take 0.5 tablets (12.5 mg total) by mouth 2 (two) times daily. 30 tablet 1   Misc Natural Products (MAGIC MUSHROOM MIX) CAPS Take 1 capsule by mouth daily. 5-in-1 Mushroom Complex     pantoprazole  (PROTONIX ) 40 MG tablet TAKE 1 TABLET BY MOUTH EVERY DAY 90 tablet 1   rosuvastatin  (CRESTOR ) 10 MG tablet Take 1 tablet (10 mg total) by mouth every evening.     thyroid  (ARMOUR THYROID ) 60 MG tablet TAKE ONE TABLET BY MOUTH ON MONDAY, WEDNESDAY, THURSDAY, FRIDAY, AND SUNDAY. 65 tablet 1   warfarin (COUMADIN ) 4 MG tablet Take 0.5-1 tablets (2-4 mg total) by mouth See admin instructions. 4 mg on Sun Mon Wed Fri and 2 mg Tues Thurs and Sat 90 tablet 1   zinc gluconate 50 MG tablet Take 50 mg by mouth daily.     enoxaparin  (LOVENOX ) 80 MG/0.8ML injection Inject 0.8 mLs (80 mg total) into the skin every 12 (twelve) hours for 3 doses. Start on 07/01/23 2.4 mL 0   gabapentin  (NEURONTIN ) 100 MG capsule Take 100 mg by mouth 3 (three) times daily. 07/10/23- Reports during TOC call takes PRN for neuropathy- per neurology provider     predniSONE  (DELTASONE ) 10 MG tablet Take 10 mg by mouth as needed (pain). 07/10/23- Reports during TOC call takes PRN for rheumatologist- per rheumatologist provider     No facility-administered medications prior to visit.    No Known Allergies  Review of Systems  Constitutional:  Negative for fever and malaise/fatigue.  HENT:  Negative for  congestion.   Eyes:  Negative for blurred vision.  Respiratory:  Negative for shortness of breath.   Cardiovascular:  Negative for  chest pain, palpitations and leg swelling.  Gastrointestinal:  Negative for abdominal pain, blood in stool and nausea.  Genitourinary:  Negative for dysuria and frequency.  Musculoskeletal:  Positive for back pain, joint pain, myalgias and neck pain. Negative for falls.  Skin:  Negative for rash.  Neurological:  Negative for dizziness, loss of consciousness and headaches.  Endo/Heme/Allergies:  Negative for environmental allergies.  Psychiatric/Behavioral:  Negative for depression. The patient is not nervous/anxious.        Objective:     Physical Exam Constitutional:      General: He is not in acute distress.    Appearance: Normal appearance. He is not ill-appearing or toxic-appearing.  HENT:     Head: Normocephalic and atraumatic.     Right Ear: External ear normal.     Left Ear: External ear normal.     Nose: Nose normal.  Eyes:     General:        Right eye: No discharge.        Left eye: No discharge.  Pulmonary:     Effort: Pulmonary effort is normal.  Skin:    Findings: No rash.  Neurological:     Mental Status: He is alert and oriented to person, place, and time.  Psychiatric:        Behavior: Behavior normal.     BP 118/77 Comment: pt reported  Pulse 60 Comment: pt reported  Ht 5\' 11"  (1.803 m)   Wt 155 lb 6.4 oz (70.5 kg) Comment: pt reported  SpO2 97% Comment: pt reported  BMI 21.67 kg/m  Wt Readings from Last 3 Encounters:  07/21/23 155 lb 6.4 oz (70.5 kg)  07/08/23 160 lb 7.9 oz (72.8 kg)  07/01/23 165 lb 11.2 oz (75.2 kg)       Assessment & Plan:  B12 deficiency Assessment & Plan: Supplement and monitor   Orders: -     Vitamin B12; Future  Coronary artery disease involving native coronary artery of native heart with angina pectoris [facility]) Assessment & Plan: Recently underwent CABG with Dr Sherene Dilling.    Hyperglycemia  Hyperlipidemia, unspecified hyperlipidemia type Assessment & Plan: Tolerating statin, encouraged heart healthy diet, avoid  trans fats, minimize simple carbs and saturated fats. Increase exercise as tolerated  Orders: -     Comprehensive metabolic panel with GFR; Future -     CBC with Differential/Platelet; Future -     Lipid panel; Future  Hypothyroidism, unspecified type Assessment & Plan: Stable on Armour Thyroid   Orders: -     TSH; Future  Type 2 diabetes mellitus with hyperlipidemia ([name]) Assessment & Plan: hgba1c acceptable, minimize simple carbs. Increase exercise as tolerated. Continue current meds labs reordered. . Needs protein intake every 3-4 hours while awake.   Orders: -     Microalbumin / creatinine urine ratio; Future -     Comprehensive metabolic panel with GFR; Future -     CBC with Differential/Platelet; Future -     Hemoglobin A1c; Future  Vitamin D  deficiency Assessment & Plan: Supplement and monitor  Orders: -     VITAMIN D  25 Hydroxy (Vit-D Deficiency, Fractures); Future  Other orders -     Empagliflozin; Take 1 tablet (10 mg total) by mouth daily before breakfast.  Dispense: 30 tablet; Refill: 3     Assessment and Plan Assessment & Plan Type  2 Diabetes Mellitus Blood glucose levels elevated but not diagnostic for diabetes. Hemoglobin A1c 6.3, fasting blood sugar 124. Recent surgery may have elevated levels. Jardiance preferred for cardiovascular and renal benefits, but insurance may require metformin  trial. Brother's metformin  reaction may support Jardiance approval. Long-term management advised to prevent complications. - Order hemoglobin A1c and full panel blood work for July 30 or later. - Instruct to check blood glucose daily, alternating fasting and postprandial. - Educate on dietary modifications: minimize simple carbohydrates, avoid extended fasting, consume protein every 3-4 hours. - Discuss Jardiance benefits for long-term management and cardiovascular protection. - Send Jardiance prescription to [facility], fill upon request. - Schedule follow-up for August 7 to  review labs and discuss treatment.     I discussed the assessment and treatment plan with the patient. The patient was provided an opportunity to ask questions and all were answered. The patient agreed with the plan and demonstrated an understanding of the instructions.   The patient was advised to call back or seek an in-person evaluation if the symptoms worsen or if the condition fails to improve as anticipated.  Randie Bustle, MD [facility] [phone] (phone) [phone] (fax)  [facility]

## 2023-07-24 ENCOUNTER — Ambulatory Visit: Admitting: Acute Care

## 2023-07-24 ENCOUNTER — Encounter: Payer: Self-pay | Admitting: Acute Care

## 2023-07-24 VITALS — BP 94/61 | HR 58 | Ht 71.0 in | Wt 160.8 lb

## 2023-07-24 DIAGNOSIS — Z87891 Personal history of nicotine dependence: Secondary | ICD-10-CM

## 2023-07-24 DIAGNOSIS — R911 Solitary pulmonary nodule: Secondary | ICD-10-CM | POA: Diagnosis not present

## 2023-07-24 DIAGNOSIS — Z951 Presence of aortocoronary bypass graft: Secondary | ICD-10-CM | POA: Diagnosis not present

## 2023-07-24 NOTE — Patient Instructions (Addendum)
 It is good to see you today. The lung nodule we have been watching is stable. This is great news. We will do a 12 month follow up scan to ensure continued stability. This will be due 06/2024. You will get a call closer to the time to get this scheduled.  Call Dr. Everlina Hock office for any other issues with your BP.  Follow up with Marlyse Single PA next week as is scheduled.  Call us  if you need us .  Please contact office for sooner follow up if symptoms do not improve or worsen or seek emergency care

## 2023-07-24 NOTE — Progress Notes (Signed)
 History of Present Illness Paul Ryan is a 78 y.o. male former smoker, quit 1982, with a 20 pack year smoking history. He is a former patient of Dr. Thelda Finney.He will be followed by Dr. Baldwin Levee   07/24/2023 Pt. Presents for follow up after CT Chest to monitor a pulmonary nodule. He states he has been doing well. He recently had CABG x 3 on 07/03/2023.He is recovering well.  He states his breathing has been at his baseline. His BP was low today in the office. 94/61. He is on a Beta Blocker post CABG.He has called Dr. Everlina Hock office  earlier in the week for low BP and light headedness and they gave him criteria for holding his Metoprolol . He was 111 systolic today at home and he did take his medication. I have encouraged him to call Dr. Leeroy Pulley office if this continues.  We have reviewed the results of his CT Chest. The 7 mm pulmonary nodule in the right upper lobe is stable. Plan will be for a 1 year follow up, and if stable we can consider scans every other year, Pt. Is in agreement with this plan.  Test Results: CT Chest 06/13/2023 Ill-defined 7 mm right upper lobe ground-glass nodule or scar, wasn't seen in 2017 but is unchanged since 06/04/2021 is again noted on 3:64.   There are scarring changes in the lung bases and apices, minor paraseptal emphysematous change in the apices.   The central airways are widely patent. No new or further lung nodules are seen  Stable 7 mm right upper lobe ground-glass nodule or scar. Follow-up study recommended in 2 years. 2. Aortic and coronary artery atherosclerosis. 3. Small hiatal hernia. 4. Osteopenia and degenerative change.     Latest Ref Rng & Units 07/05/2023    3:23 AM 07/04/2023    3:54 PM 07/04/2023    5:21 AM  CBC  WBC 4.0 - 10.5 K/uL 11.6  11.4  9.8   Hemoglobin 13.0 - 17.0 g/dL 16.1  09.6  04.5   Hematocrit 39.0 - 52.0 % 36.0  33.2  33.1   Platelets 150 - 400 K/uL 125  126  127        Latest Ref Rng & Units 07/05/2023    3:23 AM 07/04/2023     3:54 PM 07/04/2023    5:21 AM  BMP  Glucose 70 - 99 mg/dL 409  811  914   BUN 8 - 23 mg/dL 11  11  10    Creatinine 0.61 - 1.24 mg/dL 7.82  9.56  2.13   Sodium 135 - 145 mmol/L 136  136  136   Potassium 3.5 - 5.1 mmol/L 3.7  3.8  4.0   Chloride 98 - 111 mmol/L 100  102  106   CO2 22 - 32 mmol/L 27  27  22    Calcium  8.9 - 10.3 mg/dL 7.9  7.5  7.3     BNP No results found for: "BNP"  ProBNP No results found for: "PROBNP"  PFT No results found for: "FEV1PRE", "FEV1POST", "FVCPRE", "FVCPOST", "TLC", "DLCOUNC", "PREFEV1FVCRT", "PSTFEV1FVCRT"  DG Chest Port 1 View Result Date: 07/05/2023 CLINICAL DATA:  [id number].  History of CABG x3. EXAM: PORTABLE CHEST 1 VIEW COMPARISON:  Portable chest yesterday at 5:25 a.m. FINDINGS: 5:36 a.m. interval removal left chest tube, mediastinal and pericardial drains. Swan-Ganz catheter has also been removed with the right IJ Cordis catheter introducer left in with the tip about the brachiocephalic/SVC junction. There is a trace left apical pneumothorax,  new. There is increasing small left pleural effusion, stable minimal right effusion. There is increasing patchy opacity in the left base which could be atelectasis, pneumonia or aspiration. There is increased perihilar linear atelectasis on the right. Remaining lungs are clear. Stable mediastinum with aortic tortuosity and calcification. Stable CABG change. Mild cardiomegaly. No vascular congestion is seen. No new osseous abnormality. IMPRESSION: 1. Interval removal of left chest tube, mediastinal and pericardial drains. 2. New trace left apical pneumothorax. 3. Increasing small left pleural effusion, stable minimal right effusion. 4. Increasing patchy opacity in the left base which could be atelectasis, pneumonia or aspiration. Fall Electronically Signed   By: Denman Fischer M.D.   On: 07/05/2023 06:14   ECHO INTRAOPERATIVE TEE Result Date: 07/04/2023  *INTRAOPERATIVE TRANSESOPHAGEAL REPORT *  Patient Name:   Paul Ryan      Date of Exam: 07/03/2023 Medical Rec #:  [id number]      Height:       71.0 in Accession #:    [id number]     Weight:       163.7 lb Date of Birth:  Jul 12, 1945      BSA:          1.94 m Patient Age:    77 years       BP:           104/75 mmHg Patient Gender: M              HR:           55 bpm. Exam Location:  Anesthesiology Transesophogeal exam was perform intraoperatively during surgical procedure. Patient was closely monitored under general anesthesia during the entirety of examination. Indications:     CAD Native Vessel i25.10 Sonographer:     Sherline Distel Senior RDCS Performing Phys: Arlyne Lame MD Diagnosing Phys: Arlyne Lame MD Complications: No known complications during this procedure. POST-OP IMPRESSIONS     s/p CABG x3 _ Left Ventricle: LVEF unchanged. CO > 3L/min. No RWMA's. _ Right Ventricle: The right ventricle appears unchanged from pre-bypass. _ Aorta: No dissection noted after cannula removed. _ Left Atrium: The left atrium appears unchanged from pre-bypass. _ Left Atrial Appendage: The left atrial appendage appears unchanged from pre-bypass. _ Aortic Valve: The aortic valve appears unchanged from pre-bypass. _ Mitral Valve: The mitral valve appears unchanged from pre-bypass. _ Tricuspid Valve: The tricuspid valve appears unchanged from pre-bypass. _ Pulmonic Valve: The pulmonic valve appears unchanged from pre-bypass. _ Interatrial Septum: The interatrial septum appears unchanged from pre-bypass. _ Interventricular Septum: The interventricular septum appears unchanged from pre-bypass. _ Pericardium: The pericardium appears unchanged from pre-bypass. PRE-OP FINDINGS  Left Ventricle: The left ventricle has normal systolic function, with an ejection fraction of 60-65%. The cavity size was normal. No evidence of left ventricular regional wall motion abnormalities. Left ventricular diastolic function was not evaluated. Right Ventricle: The right ventricle has normal systolic function. The cavity was  normal. There is no increase in right ventricular wall thickness. Catheter present in the right ventricle. There is no aneurysm seen. Left Atrium: Left atrial size was normal in size. No left atrial/left atrial appendage thrombus was detected. The left atrial appendage is well visualized and there is no evidence of thrombus present. Left atrial appendage velocity is reduced at less than 40 cm/s. Right Atrium: Right atrial size was normal in size. Catheter present in the right atrium. Interatrial Septum: No atrial level shunt detected by color flow Doppler. There is no evidence of a patent foramen  ovale. Pericardium: There is no evidence of pericardial effusion. There is no pleural effusion. Mitral Valve: The mitral valve is normal in structure. Mitral valve regurgitation is mild by color flow Doppler. The MR jet is centrally-directed. There is no evidence of mitral valve vegetation. There is no evidence of mitral stenosis. Tricuspid Valve: The tricuspid valve was normal in structure. Tricuspid valve regurgitation is trivial by color flow Doppler. The jet is directed centrally. No evidence of tricuspid stenosis is present. There is no evidence of tricuspid valve vegetation. Aortic Valve: The aortic valve is tricuspid aortic valve regurgitation is trivial by color flow Doppler. The jet is centrally-directed. There is no stenosis of the aortic valve. There is no evidence of aortic valve vegetation. Pulmonic Valve: The pulmonic valve was normal in structure. Pulmonic valve regurgitation is trivial by color flow Doppler. Aorta: The aortic root, aortic arch and ascending aorta are normal in size and structure. Pulmonary Artery: Harlo Ligas catheter present on the right. The pulmonary artery is of normal size. Shunts: There is no evidence of an atrial septal defect. +--------------+--------++ LEFT VENTRICLE         +--------------+--------++ PLAX 2D                +--------------+--------++ LVIDd:        4.80 cm   +--------------+--------++ LVIDs:        3.10 cm  +--------------+--------++ LVOT diam:    2.40 cm  +--------------+--------++ LV SV:        70 ml    +--------------+--------++ LV SV Index:  36.08    +--------------+--------++ LVOT Area:    4.52 cm +--------------+--------++                        +--------------+--------++ +----------------+-----------++ MR Peak grad:   83.5 mmHg    +--------------+-------+ +----------------+-----------++  SHUNTS                MR Vmax:        457.00 cm/s  +--------------+-------+ +----------------+-----------++  Systemic Diam:2.40 cm MR PISA:        2.26 cm     +--------------+-------+ +----------------+-----------++ MR PISA Eff ROA:17 mm      +----------------+-----------++ MR PISA Radius: 0.60 cm     +----------------+-----------++  Arlyne Lame MD Electronically signed by Arlyne Lame MD Signature Date/Time: 07/04/2023/8:52:29 AM    Final    DG Chest Port 1 View Result Date: 07/04/2023 CLINICAL DATA:  [id number].  Status post CABG x3. EXAM: PORTABLE CHEST 1 VIEW COMPARISON:  Portable chest yesterday at 12:59 p.m. FINDINGS: 5:25 a.m. ETT/NGT interval removal. Right IJ Swan-Ganz line has been advanced into proximal right pulmonary artery. Left chest tube with tip in the medial apex is unchanged, with no measurable pneumothorax. There is a pericardial and a mediastinal drain in place as before. Mediastinum appears stable. There are CABG changes and aortic atherosclerosis. Mild cardiomegaly. Normal caliber central vessels. Stable appearance of atelectasis in both lung bases. Small left pleural effusion appears unchanged. The remaining lungs are clear with no new abnormality or changes in overall aeration. IMPRESSION: 1. Interval removal of ETT/NGT. 2. Right IJ Swan-Ganz line has been advanced into proximal right pulmonary artery. 3. Left chest tube with tip in the medial apex, with no measurable pneumothorax. 4. Stable appearance of  atelectasis in both lung bases and small left pleural effusion. 5. No new abnormality. Electronically Signed   By: Denman Fischer M.D.   On: 07/04/2023 07:33   DG  Chest Port 1 View Result Date: 07/03/2023 CLINICAL DATA:  Status post CABG. EXAM: PORTABLE CHEST 1 VIEW COMPARISON:  Chest radiograph dated 07/01/2023. FINDINGS: Right IJ Swan-Ganz catheter with tip over mediastinum to thing 12 the proximal right pulmonary artery. Endotracheal tube approximately 6 cm above the carina. Enteric tube with tip and side-port in the proximal stomach. No sided chest tube. Small left pleural effusion and left lung base atelectasis. No pneumothorax. Median sternotomy wires and CABG vascular clips. No acute osseous pathology. IMPRESSION: 1. Support apparatus as above. 2. Small left pleural effusion and left lung base atelectasis. Electronically Signed   By: Angus Bark M.D.   On: 07/03/2023 13:38   VAS US  DOPPLER PRE CABG Result Date: 07/01/2023 PREOPERATIVE VASCULAR EVALUATION Patient Name:  KALLEN MCCRYSTAL  Date of Exam:   07/01/2023 Medical Rec #: [id number]  Accession #:    [id number] Date of Birth: 12-May-1945  Patient Gender: M Patient Age:   89 years Exam Location:  [facility] Procedure:      VAS US  DOPPLER PRE CABG Referring Phys: Linder Revere --------------------------------------------------------------------------------  Indications:   Pre-CABG. Risk Factors:  Hyperlipidemia, coronary artery disease. 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 Performing Technologist: Franky Ivanoff Sturdivant-Jones RDMS, RVT  Examination Guidelines: A complete evaluation includes B-mode imaging, spectral Doppler, color Doppler, and power Doppler as needed of all accessible portions of each vessel. Bilateral testing is considered an integral part of a complete examination. Limited examinations for reoccurring indications may be performed as noted.  Right Carotid Findings:  +----------+--------+--------+--------+--------+------------------+           PSV cm/sEDV cm/sStenosisDescribeComments           +----------+--------+--------+--------+--------+------------------+ CCA Prox  69      18                                         +----------+--------+--------+--------+--------+------------------+ CCA Distal61      26                      intimal thickening +----------+--------+--------+--------+--------+------------------+ ICA Prox  46      18      1-39%   calcific                   +----------+--------+--------+--------+--------+------------------+ ICA Distal63      26                                         +----------+--------+--------+--------+--------+------------------+ ECA       58      12                                         +----------+--------+--------+--------+--------+------------------+ +----------+--------+-------+----------------+------------+           PSV cm/sEDV cmsDescribe        Arm Pressure +----------+--------+-------+----------------+------------+ Subclavian93             Multiphasic, WNL             +----------+--------+-------+----------------+------------+ +---------+--------+--+--------+--+---------+ VertebralPSV cm/s41EDV cm/s11Antegrade +---------+--------+--+--------+--+---------+ Left Carotid Findings: +----------+--------+--------+--------+------------+------------------+  PSV cm/sEDV cm/sStenosisDescribe    Comments           +----------+--------+--------+--------+------------+------------------+ CCA Prox  81      27                                             +----------+--------+--------+--------+------------+------------------+ CCA Distal64      23                          intimal thickening +----------+--------+--------+--------+------------+------------------+ ICA Prox  63      27      1-39%   heterogenous                    +----------+--------+--------+--------+------------+------------------+ ICA Distal80      29                          tortuous           +----------+--------+--------+--------+------------+------------------+ ECA       58      14                                             +----------+--------+--------+--------+------------+------------------+ +----------+--------+--------+----------------+------------+ SubclavianPSV cm/sEDV cm/sDescribe        Arm Pressure +----------+--------+--------+----------------+------------+           65              Multiphasic, WNL             +----------+--------+--------+----------------+------------+ +---------+--------+--+--------+--+---------+ VertebralPSV cm/s28EDV cm/s12Antegrade +---------+--------+--+--------+--+---------+  ABI Findings: +------------------+-----+---------+ Rt Pressure (mmHg)IndexWaveform  +------------------+-----+---------+ 120                    triphasic +------------------+-----+---------+ 158               1.24 triphasic +------------------+-----+---------+ 152               1.20 triphasic +------------------+-----+---------+ +------------------+-----+---------+ Lt Pressure (mmHg)IndexWaveform  +------------------+-----+---------+ 127                    triphasic +------------------+-----+---------+ 141               1.11 triphasic +------------------+-----+---------+ 133               1.05 triphasic +------------------+-----+---------+ +-------+---------------+----------------+ ABI/TBIToday's ABI/TBIPrevious ABI/TBI +-------+---------------+----------------+ Right  1.24           1.21/0.87        +-------+---------------+----------------+ Left   1.11           1.16/0.80        +-------+---------------+----------------+ Bilateral ABIs appear essentially unchanged compared to prior study on 12/30/2021. Right Doppler Findings: +-----------+--------+---------+ Site        PressureDoppler   +-----------+--------+---------+ Brachial   120     triphasic +-----------+--------+---------+ Radial             triphasic +-----------+--------+---------+ Palmar Arch        triphasic +-----------+--------+---------+  Left Doppler Findings: +--------+--------+---------+ Site    PressureDoppler   +--------+--------+---------+ [id number]     triphasic +--------+--------+---------+ Radial          triphasic +--------+--------+---------+ Ulnar           triphasic +--------+--------+---------+  Summary: Right Carotid: Velocities in the right ICA are consistent with a 1-39% stenosis. Left Carotid: Velocities in the left ICA are consistent with a 1-39% stenosis. Vertebrals:  Bilateral vertebral arteries demonstrate antegrade flow. Subclavians: Normal flow hemodynamics were seen in bilateral subclavian              arteries. Right ABI: Resting right ankle-brachial index is within normal range. Left ABI: Resting left ankle-brachial index is within normal range. Right Upper Extremity: Doppler waveforms remain within normal limits with right radial compression. Doppler waveforms decrease 50% with right ulnar compression. Left Upper Extremity: Doppler waveforms decrease 50% with left radial compression. Doppler waveforms remain within normal limits with left ulnar compression.  Electronically signed by Genny Kid MD on 07/01/2023 at 8:29:38 PM.    Final    DG Chest 2 View Result Date: 07/01/2023 CLINICAL DATA:  Preop chest exam.  Upcoming CABG. EXAM: CHEST - 2 VIEW COMPARISON:  Chest radiograph 02/08/2015.  CT 06/13/2023 FINDINGS: The cardiomediastinal contours are normal. Aortic atherosclerosis. Pulmonary vasculature is normal. No consolidation, pleural effusion, or pneumothorax. No acute osseous abnormalities are seen. IMPRESSION: No active cardiopulmonary disease. Electronically Signed   By: Chadwick Colonel M.D.   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   Hyperglycemia 12/26/2014   Hyperlipidemia    Hypothyroid 06/10/2011   Insomnia 04/24/2016   Left hip pain 12/17/2010   MCI (mild cognitive impairment) 07/22/2016   Nasal lesion 07/22/2016   Peripheral vascular disease ([name])    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   Polymyalgia ([name]) 03/21/2014   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   "treated with [name]"   Preventative health care 12/17/2010   Shingles 1982   right abdominal wall   Sleep apnea    Staph skin infection 07/22/2016   Thyroid  nodule 11/29/2014   Urinary hesitancy 09/30/2016   Vitamin D  deficiency 10/14/2011     Social History   Tobacco Use   Smoking status: Former    Current packs/day: 0.00    Average packs/day: 1 pack/day for 20.0 years (20.0 ttl pk-yrs)    Types: Cigarettes    Start date: 03/03/1960    Quit date: 03/03/1980    Years since quitting: 43.4   Smokeless tobacco: Never  Vaping Use   Vaping status: Never Used  Substance Use Topics   Alcohol use: No    Alcohol/week: 0.0 standard drinks of alcohol   Drug use: No    Paul Ryan reports that he quit smoking about 43 years ago. His smoking use included cigarettes. He started smoking about 63 years ago. He has a 20 pack-year smoking history. He has never used smokeless tobacco. He reports that he does not drink alcohol and does not use drugs.  Tobacco Cessation: Counseling given: Not Answered Former smoker, quit 43 years ago. 20 pack year smoking history  Past surgical hx, Family hx, Social hx  all reviewed.  Current Outpatient Medications on File Prior to Visit  Medication Sig   ACCU-CHEK GUIDE TEST test strip USE TO CHECK BLOOD SUGAR ONCE DAILY . E11.9   Accu-Chek Softclix Lancets lancets USE AS DIRECTED TO CHECK SUGAR ONCE DAILY.E11.9   acetaminophen  (TYLENOL ) 325 MG tablet Take 2 tablets (650 mg total) by mouth every 6 (six) hours as needed for mild pain (pain score 1-3).   ALPHA LIPOIC ACID PO Take 1 capsule by mouth daily.   aspirin  EC 81 MG tablet Take 1 tablet (81 mg total) by mouth daily. Swallow whole.   Blood Glucose Monitoring Suppl (ONETOUCH VERIO FLEX SYSTEM) w/Device KIT Use to check blood sugar once a day. DX E11.9   cetirizine  (ZYRTEC ) 10 MG tablet Take 10 mg by mouth at bedtime.   Cholecalciferol (VITAMIN D -3 PO) Take 1 capsule by mouth daily.   Coenzyme Q10 (COQ10) 100 MG CAPS Take 1 capsule by mouth daily.   DULoxetine  (CYMBALTA ) 60 MG capsule Take 60 mg by mouth in the morning.   empagliflozin (JARDIANCE) 10 MG TABS tablet Take 1 tablet (10 mg total) by mouth daily before breakfast.   Evolocumab  (REPATHA  SURECLICK) 140 MG/ML SOAJ Inject 140 mg into the skin every 14 (fourteen) days.   MAGNESIUM  PO Take 1 capsule by mouth daily.   metoprolol  tartrate (LOPRESSOR ) 25 MG tablet Take 0.5 tablets (12.5 mg total) by mouth 2 (two) times daily.   Misc Natural Products (MAGIC MUSHROOM MIX) CAPS Take 1 capsule by mouth daily. 5-in-1 Mushroom Complex   pantoprazole  (PROTONIX ) 40 MG tablet TAKE 1 TABLET BY MOUTH EVERY DAY   rosuvastatin  (CRESTOR ) 10 MG tablet Take 1 tablet (10 mg total) by mouth every evening.   thyroid  (ARMOUR THYROID ) 60 MG tablet TAKE ONE TABLET BY MOUTH ON MONDAY, WEDNESDAY, THURSDAY, FRIDAY, AND SUNDAY.   tiZANidine  (ZANAFLEX ) 2 MG tablet Take 0.5-2 tablets (1-4 mg total) by mouth 2 (two) times daily as needed for muscle spasms.   warfarin (COUMADIN ) 4 MG tablet Take 0.5-1 tablets (2-4 mg total) by mouth See admin instructions. 4 mg on Sun Mon Wed Fri  and 2 mg Tues Thurs and Sat   zinc gluconate 50 MG tablet Take 50 mg by mouth daily.   No current facility-administered medications on file prior to visit.     No Known Allergies  Review Of Systems:  Constitutional:   No  weight loss, night sweats,  Fevers, chills, + fatigue, or  lassitude.  HEENT:   No headaches,  Difficulty swallowing,  Tooth/dental problems, or  Sore throat,                No sneezing, itching, ear ache, nasal congestion, post nasal drip,   CV:  + post op  chest pain,  No Orthopnea, PND, swelling in lower extremities, anasarca, dizziness, palpitations, syncope.   GI  No heartburn, indigestion, abdominal pain, nausea, vomiting, diarrhea, change in bowel habits, loss of appetite, bloody stools.   Resp: No shortness of breath with exertion or at rest.  No excess mucus, no productive cough,  No non-productive cough,  No coughing up of blood.  No change in color of mucus.  No wheezing.  No chest wall deformity  Skin: no rash or lesions.  GU: no dysuria, change in color of urine, no urgency or frequency.  No flank pain, no hematuria   MS:  No joint pain or swelling.  No decreased range of motion.  No back pain.  Psych:  No change in mood or affect. No depression or anxiety.  No memory  loss.   Vital Signs BP 94/61 (BP Location: Right Arm, Patient Position: Sitting, Cuff Size: Normal)   Pulse (!) 58   Ht 5\' 11"  (1.803 m)   Wt 160 lb 12.8 oz (72.9 kg)   SpO2 96%   BMI 22.43 kg/m    Physical Exam:  General- No distress,  A&Ox3, pleasant  ENT: No sinus tenderness, TM clear, pale nasal mucosa, no oral exudate,no post nasal drip, no LAN Cardiac: S1, S2, regular rate and rhythm, no murmur Chest: No wheeze/ rales/ dullness; no accessory muscle use, no nasal flaring, no sternal retractions Abd.: Soft Non-tender, ND, BS +, Body mass index is 22.43 kg/m.  Ext: No clubbing cyanosis, edema, no obvious deformities Neuro:  normal strength, MAE x 4, A&O x 3,  appropriate Skin: No rashes, warm and dry, no obvious lesions  Psych: normal mood and behavior   Assessment/Plan Stable pulmonary nodule in a remote former smoker with a 20 pack year smoking Hx. SP CABG x 3 on 07/03/2023 Soft BP in office Plan The lung nodule we have been watching is stable. This is great news. We will do a 12 month follow up scan to ensure continued stability. This will be due 06/2024. You will get a call closer to the time to get this scheduled.  Call Dr. Everlina Hock office for any other issues with your BP.  Follow up with Marlyse Single PA next week as is scheduled.  Call us  if you need us .  Please contact office for sooner follow up if symptoms do not improve or worsen or seek emergency care    I spent 20 minutes dedicated to the care of this patient on the date of this encounter to include pre-visit review of records, face-to-face time with the patient discussing conditions above, post visit ordering of testing, clinical documentation with the electronic health record, making appropriate referrals as documented, and communicating necessary information to the patient's healthcare team.      Raejean Bullock, NP 07/24/2023  2:08 PM

## 2023-07-27 NOTE — Progress Notes (Unsigned)
   OFFICE NOTE Date:  07/27/2023  ID:  [name], DOB 01-Oct-1945, MRN [id number] PCP: Neda Balk, MD  [facility] Providers Cardiologist:  Dorothye Gathers, MD { Click to update primary MD,subspecialty MD or APP then REFRESH:1}    Patient Profile:     Coronary artery disease s/p CABG 06/2023 (L-LaD, S-Dx, S-RCA) PET MPI 05/27/23: Ant ischemia, EF 60, global MBFR abnormal LHC 06/11/23: pLAD 70, D1 95, OM3 20, pRCA 80  TTE 06/11/23: EF 55-60, no RWMA, NL RVSF, trivial MR, trivial AI, AV sclerosis, aortic root 40 mm  Hyperlipidemia  Lp(a) 512 Diabetes mellitus  FHx of CAD  Protein S deficiency  Hx of DVT  Chronic anticoagulation - Warfarin Carotid stenosis Pre CABG dopplers 07/01/23: Bilat ICA 1-39        Discussed the use of AI scribe software for clinical note transcription with the patient, who gave verbal consent to proceed. History of Present Illness Paul Ryan is a 78 y.o. male who returns for post hospitalization follow up. He was admitted 5/2-5/9 and underwent CABG with Dr. Sherene Dilling. Post op course was notable for expected blood loss anemia and transient 10 AV block.   ROS-See HPI***    Studies Reviewed:      *** Results  Risk Assessment/Calculations: {Does this patient have ATRIAL FIBRILLATION?:[phone]} No BP recorded.  {Refresh Note OR Click here to enter BP  :1}***      Physical Exam:  VS:  There were no vitals taken for this visit.   Wt Readings from Last 3 Encounters:  07/24/23 160 lb 12.8 oz (72.9 kg)  07/21/23 155 lb 6.4 oz (70.5 kg)  07/08/23 160 lb 7.9 oz (72.8 kg)    Physical Exam***     Assessment and Plan: Assessment & Plan Coronary artery disease involving native coronary artery of native heart without angina pectoris  Pure hypercholesterolemia  Assessment and Plan Assessment & Plan    {      :1}   {The patient has an active order for outpatient cardiac rehabilitation.   Please indicate if the patient is ready to start. Do NOT delete  this.  It will auto delete.  Refresh note, then sign.              Click here to document readiness and see contraindications.  :1}  Cardiac Rehabilitation Eligibility Assessment    {Are you ordering a CV Procedure (e.g. stress test, cath, DCCV, TEE, etc)?   Press F2        :[id number]}  Dispo:  No follow-ups on file. Signed, Marlyse Single, PA-C

## 2023-07-28 ENCOUNTER — Encounter: Payer: Self-pay | Admitting: Cardiology

## 2023-07-28 ENCOUNTER — Ambulatory Visit: Attending: Physician Assistant | Admitting: Cardiology

## 2023-07-28 VITALS — BP 110/72 | HR 83 | Ht 71.0 in | Wt 164.0 lb

## 2023-07-28 DIAGNOSIS — E7800 Pure hypercholesterolemia, unspecified: Secondary | ICD-10-CM

## 2023-07-28 DIAGNOSIS — I2510 Atherosclerotic heart disease of native coronary artery without angina pectoris: Secondary | ICD-10-CM

## 2023-07-28 DIAGNOSIS — D6859 Other primary thrombophilia: Secondary | ICD-10-CM

## 2023-07-28 NOTE — Patient Instructions (Signed)
 Medication Instructions:  Your physician recommends that you continue on your current medications as directed. Please refer to the Current Medication list given to you today.  *If you need a refill on your cardiac medications before your next appointment, please call your pharmacy*  Lab Work: None ordered  If you have labs (blood work) drawn today and your tests are completely normal, you will receive your results only by: MyChart Message (if you have MyChart) OR A paper copy in the mail If you have any lab test that is abnormal or we need to change your treatment, we will call you to review the results.  Testing/Procedures: None orderex  Follow-Up: At [facility], you and your health needs are our priority.  As part of our continuing mission to provide you with exceptional heart care, our providers are all part of one team.  This team includes your primary Cardiologist (physician) and Advanced Practice Providers or APPs (Physician Assistants and Nurse Practitioners) who all work together to provide you with the care you need, when you need it.  Your next appointment:   3 month(s)  Provider:   Morgan Arab, PA-C          We recommend signing up for the patient portal called "MyChart".  Sign up information is provided on this After Visit Summary.  MyChart is used to connect with patients for Virtual Visits (Telemedicine).  Patients are able to view lab/test results, encounter notes, upcoming appointments, etc.  Non-urgent messages can be sent to your provider as well.   To learn more about what you can do with MyChart, go to [email or web address].   Other Instructions

## 2023-07-28 NOTE — Progress Notes (Signed)
 Cardiology Office Note:  .   Date:  07/28/2023  ID:  Paul Ryan, DOB 11-22-1945, MRN [id number] PCP: Neda Balk, MD  [facility] Providers Cardiologist:  Dorothye Gathers, MD {  History of Present Illness: .   Paul Ryan is a 78 y.o. male with history of CAD status post CABG 06/2023 x 3, first-degree AV block, hyperlipidemia, diabetes, protein S deficiency with history of DVT on lifetime warfarin, mild carotid artery stenosis, hypothyroidism, prediabetes, 7 mm pulmonary nodule in the right upper lobe      Coronary artery disease s/p CABG 06/2023 LIMA to LAD, SVG to Diagonal, and SVG to RCA.  Abnormal PET/CT 05/27/23: Ant ischemia, EF 60, mildly reduced MBFR concerning for Ischemia.  Resting EF 48%.  Stress EF 60% MVD on LHC 06/11/23: pLAD 70, D1 95, OM3 20, pRCA 80  Echo 06/11/23: Preserved biventricular function with no significant valvular disease.  Mild dilatation of aortic root 40 mm. Hyperlipidemia  Lp(a) 512 Protein S deficiency  Hx of DVT  Lifetime anticoagulation on Warfarin, followed by oncology Pre CABG dopplers 07/01/23: Bilat ICA 1-39   Pulmonary nodule  Previous smoker 20-pack-year history. Followed by pulmonology with stable 7 mm right upper lobe nodule.     Patient had abnormal PET/stress test which prompted heart cath showing multivessel disease.  Now here for follow-up after recent CABG x 3 with admission from 5/2 - 5/9.  Overall tolerated procedure very well, no significant postoperative complications.   Patient here for postoperative follow-up.  Overall doing well.  No chest pain.  Notes some mild shortness of breath sometimes in the morning but not functionally limiting or of significant concern.  He still is walking for 15 to 20 minutes 3-4 times daily without any issues.  Denies any peripheral edema, orthopnea.  Compliant with all of his medications.  Wife is accompanied with him today.  ROS: Denies: Chest pain, orthopnea, peripheral edema, palpitations, decreased  exercise intolerance, fatigue, lightheadedness.   Studies Reviewed: Paul Ryan    EKG Interpretation Date/Time:  Tuesday Jul 28 2023 14:07:46 EDT Ventricular Rate:  56 PR Interval:  210 QRS Duration:  86 QT Interval:  432 QTC Calculation: 416 R Axis:   67  Text Interpretation: Sinus bradycardia with 1st degree A-V block Septal infarct (cited on or before 04-Jul-2023)  When compared with ECG of 04-Jul-2023 23:34, Anterior T wave inversion more prominent.  Confirmed by Morgan Arab [phone]) on 07/28/2023 2:09:25 PM    Risk Assessment/Calculations:             Physical Exam:   VS:  BP 110/72   Pulse 83   Ht 5\' 11"  (1.803 m)   Wt 164 lb (74.4 kg)   SpO2 98%   BMI 22.87 kg/m    Wt Readings from Last 3 Encounters:  07/28/23 164 lb (74.4 kg)  07/24/23 160 lb 12.8 oz (72.9 kg)  07/21/23 155 lb 6.4 oz (70.5 kg)    GEN: Well nourished, well developed in no acute distress NECK: No JVD; No carotid bruits CARDIAC: RRR, no murmurs, rubs, gallops RESPIRATORY:  Clear to auscultation without rales, wheezing or rhonchi  ABDOMEN: Soft, non-tender, non-distended EXTREMITIES:  No edema; No deformity   ASSESSMENT AND PLAN: .    CAD status post CABG x 3 LIMA to LAD, SVG to Diagonal, and SVG to RCA.  Postoperatively doing well, having very mild shortness of breath but no signs of volume.  Postoperative EKG with more pronounced T wave inversions anteriorly likely related  to postoperative changes.  In the absence of any symptoms do not feel this is clinically significant.  Additionally echocardiogram showing preserved biventricular function with no significant valvular disease.  Incision scar looks to be well-healed without any obvious complications. Continue with aspirin , Lopressor  12.5 mg twice daily, Repatha , co-Q10, rosuvastatin  10 mg.  Hyperlipidemia LDL 30 in April 2025.  Well-controlled, goal less than 55. Continue Repatha  140 mg every 2 weeks, Rosuvastatin  10 mg once daily.   Protein S  deficiency History of DVT On lifetime warfarin followed by oncology.   Prediabetes A1c 6.3% April 2025, has not started Jardiance yet.  Started by PCP, he has a lot to benefit from this medication, recommended taking.  First-degree AV block Heart rates in the 80s here.  This appears to be stable on serial echocardiograms for many years now.  Okay to continue beta-blocker.  Mild dilatation of aortic root 40 mm on echocardiogram as above.  Can monitor this annually on echo.    Cardiac Rehabilitation Eligibility Assessment  The patient is ready to start cardiac rehabilitation from a cardiac standpoint.       Dispo: 66-month follow-up.  Follow-up on his shortness of breath, plan for cardiac rehab.  He will see CT surgery June 4.  Signed, Burnetta Cart, PA-C

## 2023-07-29 ENCOUNTER — Encounter: Payer: Self-pay | Admitting: Oncology

## 2023-07-29 ENCOUNTER — Telehealth: Payer: Self-pay

## 2023-07-29 NOTE — Telephone Encounter (Signed)
   Patient Name: Paul Ryan  DOB: 05-Aug-1945 MRN: [id number]  Primary Cardiologist: Dorothye Gathers, MD  Chart reviewed as part of pre-operative protocol coverage.   Simple dental extractions (i.e. 1-2 teeth), cleanings, and crowns are considered low risk procedures per guidelines and generally do not require any specific cardiac clearance. It is also generally accepted that for simple extractions and dental cleanings, there is no need to interrupt blood thinner therapy. Given recent bypass surgery, we strongly recommend continuation of ASA throughout the perioperative period.    Regarding patient's Warfarin (takes due to history of DVT and protein S deficiency), peri procedural guidance will need to come from his oncology provider who prescribes.   I will route this recommendation to the requesting party via Epic fax function and remove from pre-op pool.  Please call with questions.  Leala Prince, PA-C 07/29/2023, 12:37 PM

## 2023-07-29 NOTE — Telephone Encounter (Signed)
   Pre-operative Risk Assessment    Patient Name: Paul Ryan  DOB: Jun 17, 1945 MRN: [id number]   Date of last office visit: 07/28/23 Morgan Arab, PA-C Date of next office visit: 10/28/23 [facility], NP   Request for Surgical Clearance    Procedure:  A CROWN AND B/U FOR 1 TOOTH  Date of Surgery:  Clearance TBD                                Surgeon:  NOT INDICATED Surgeon's Group or Practice Name:  [facility] Phone number:  [phone] Fax number:  [phone]   Type of Clearance Requested:   - Medical  - Pharmacy:  Hold Aspirin  and Warfarin (Coumadin )     Type of Anesthesia:  Local    Additional requests/questions:    Signed, Collin Deal   07/29/2023, 12:26 PM

## 2023-07-30 ENCOUNTER — Other Ambulatory Visit: Payer: Self-pay | Admitting: Surgical

## 2023-08-04 ENCOUNTER — Ambulatory Visit: Admitting: Diagnostic Neuroimaging

## 2023-08-04 ENCOUNTER — Other Ambulatory Visit: Payer: Self-pay | Admitting: Surgery

## 2023-08-04 DIAGNOSIS — I2510 Atherosclerotic heart disease of native coronary artery without angina pectoris: Secondary | ICD-10-CM

## 2023-08-04 DIAGNOSIS — I25119 Atherosclerotic heart disease of native coronary artery with unspecified angina pectoris: Secondary | ICD-10-CM

## 2023-08-05 ENCOUNTER — Ambulatory Visit (HOSPITAL_COMMUNITY)
Admission: RE | Admit: 2023-08-05 | Discharge: 2023-08-05 | Disposition: A | Discharge status: Home / Self Care | Source: Ambulatory Visit | Attending: Cardiology | Admitting: Cardiology

## 2023-08-05 ENCOUNTER — Ambulatory Visit: Payer: Self-pay | Attending: Surgery | Admitting: Surgery

## 2023-08-05 ENCOUNTER — Encounter: Payer: Self-pay | Admitting: Surgery

## 2023-08-05 VITALS — BP 99/64 | HR 65 | Resp 18 | Ht 71.0 in | Wt 164.0 lb

## 2023-08-05 DIAGNOSIS — Z48812 Encounter for surgical aftercare following surgery on the circulatory system: Secondary | ICD-10-CM | POA: Diagnosis not present

## 2023-08-05 DIAGNOSIS — Z951 Presence of aortocoronary bypass graft: Secondary | ICD-10-CM | POA: Diagnosis not present

## 2023-08-05 DIAGNOSIS — I2510 Atherosclerotic heart disease of native coronary artery without angina pectoris: Secondary | ICD-10-CM | POA: Diagnosis not present

## 2023-08-05 DIAGNOSIS — I25119 Atherosclerotic heart disease of native coronary artery with unspecified angina pectoris: Secondary | ICD-10-CM | POA: Insufficient documentation

## 2023-08-05 NOTE — Progress Notes (Signed)
 [address] [place] [address]             [phone]     HPI: Patient returns for routine postoperative follow-up having undergone coronary bypass graft surgery x 3 on 07/03/2023. The patient's early postoperative recovery while in the hospital was notable for an uncomplicated postoperative course. Since hospital discharge the patient reports that he has been walking daily without chest pain or shortness of breath.  His stamina continues to improve.   Current Outpatient Medications  Medication Sig Dispense Refill   ACCU-CHEK GUIDE TEST test strip USE TO CHECK BLOOD SUGAR ONCE DAILY . E11.9 100 strip 1   Accu-Chek Softclix Lancets lancets USE AS DIRECTED TO CHECK SUGAR ONCE DAILY.E11.9 100 each 1   acetaminophen  (TYLENOL ) 325 MG tablet Take 2 tablets (650 mg total) by mouth every 6 (six) hours as needed for mild pain (pain score 1-3).     ALPHA LIPOIC ACID PO Take 1 capsule by mouth daily.     aspirin  EC 81 MG tablet Take 1 tablet (81 mg total) by mouth daily. Swallow whole.     Blood Glucose Monitoring Suppl (ONETOUCH VERIO FLEX SYSTEM) w/Device KIT Use to check blood sugar once a day. DX E11.9 1 kit 0   cetirizine  (ZYRTEC ) 10 MG tablet Take 10 mg by mouth at bedtime.     Cholecalciferol (VITAMIN D -3 PO) Take 1 capsule by mouth daily.     Coenzyme Q10 (COQ10) 100 MG CAPS Take 1 capsule by mouth daily.     DULoxetine  (CYMBALTA ) 60 MG capsule Take 60 mg by mouth in the morning.     empagliflozin  (JARDIANCE ) 10 MG TABS tablet Take 1 tablet (10 mg total) by mouth daily before breakfast. 30 tablet 3   Evolocumab  (REPATHA  SURECLICK) 140 MG/ML SOAJ Inject 140 mg into the skin every 14 (fourteen) days. 6 mL 3   famotidine  (PEPCID ) 40 MG tablet Take 40 mg by mouth daily.     MAGNESIUM  PO Take 1 capsule by mouth daily.     metoprolol  tartrate (LOPRESSOR ) 25 MG tablet Take 0.5 tablets (12.5 mg total) by mouth 2 (two) times daily. 30 tablet 1   Misc Natural Products  (MAGIC MUSHROOM MIX) CAPS Take 1 capsule by mouth daily. 5-in-1 Mushroom Complex     pantoprazole  (PROTONIX ) 40 MG tablet TAKE 1 TABLET BY MOUTH EVERY DAY 90 tablet 1   rosuvastatin  (CRESTOR ) 10 MG tablet Take 1 tablet (10 mg total) by mouth every evening.     thyroid  (ARMOUR THYROID ) 60 MG tablet TAKE ONE TABLET BY MOUTH ON MONDAY, WEDNESDAY, THURSDAY, FRIDAY, AND SUNDAY. 65 tablet 1   tiZANidine  (ZANAFLEX ) 2 MG tablet Take 0.5-2 tablets (1-4 mg total) by mouth 2 (two) times daily as needed for muscle spasms. 30 tablet 1   warfarin (COUMADIN ) 4 MG tablet Take 0.5-1 tablets (2-4 mg total) by mouth See admin instructions. 4 mg on Sun Mon Wed Fri and 2 mg Tues Thurs and Sat 90 tablet 1   zinc gluconate 50 MG tablet Take 50 mg by mouth daily.     No current facility-administered medications for this visit.    Physical Exam: BP 99/64 (BP Location: Left Arm)   Pulse 65   Resp 18   Ht 5\' 11"  (1.803 m)   Wt 164 lb (74.4 kg)   SpO2 98% Comment: RA  BMI 22.87 kg/m  He looks well. Cardiac exam shows a regular rate and rhythm  with normal heart sounds. Lung exam is clear. The chest incision is healing well and the sternum is stable. There is no peripheral edema.  Diagnostic Tests:  Narrative & Impression  CLINICAL DATA:  Status post coronary artery bypass graft.   EXAM: CHEST - 2 VIEW   COMPARISON:  Jul 05, 2023.   FINDINGS: The heart size and mediastinal contours are within normal limits. Sternotomy wires are noted. No acute pulmonary disease is noted. No pneumothorax is noted. The visualized skeletal structures are unremarkable.   IMPRESSION: No active cardiopulmonary disease.     Electronically Signed   By: Rosalene Colon M.D.   On: 08/05/2023 16:49      Impression:  Overall he is making a very good recovery following his surgery.  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  I  think he can begin cardiac rehab at any time.  Plan:  He will continue to follow-up with cardiology and will return to see me if he has any problems with his incisions.   Bartley Lightning, MD [facility] [phone]

## 2023-08-07 DIAGNOSIS — N5201 Erectile dysfunction due to arterial insufficiency: Secondary | ICD-10-CM | POA: Diagnosis not present

## 2023-08-07 DIAGNOSIS — R351 Nocturia: Secondary | ICD-10-CM | POA: Diagnosis not present

## 2023-08-07 DIAGNOSIS — N403 Nodular prostate with lower urinary tract symptoms: Secondary | ICD-10-CM | POA: Diagnosis not present

## 2023-08-08 DIAGNOSIS — Z7901 Long term (current) use of anticoagulants: Secondary | ICD-10-CM | POA: Diagnosis not present

## 2023-08-08 DIAGNOSIS — D6859 Other primary thrombophilia: Secondary | ICD-10-CM | POA: Diagnosis not present

## 2023-08-08 DIAGNOSIS — I2699 Other pulmonary embolism without acute cor pulmonale: Secondary | ICD-10-CM | POA: Diagnosis not present

## 2023-08-11 ENCOUNTER — Encounter (HOSPITAL_COMMUNITY): Payer: Self-pay

## 2023-08-13 ENCOUNTER — Telehealth (HOSPITAL_COMMUNITY): Payer: Self-pay

## 2023-08-13 NOTE — Telephone Encounter (Signed)
 Pt returned CR phone call and stated he is interested. Patient will come in for orientation on 08/25/23 @ 8AM and will attend the 1:45PM exercise class.   Pensions consultant.

## 2023-08-13 NOTE — Telephone Encounter (Signed)
Cardiac Rehab

## 2023-08-21 ENCOUNTER — Encounter: Payer: Self-pay | Admitting: Family Medicine

## 2023-08-24 ENCOUNTER — Telehealth (HOSPITAL_COMMUNITY): Payer: Self-pay

## 2023-08-24 NOTE — Telephone Encounter (Signed)
 Called pt and confirmed his Cardiac Rehab appointment tomorrow at 8am. Completed his Nursing Assessment, directions and appointment details provided. Pt understands without assistance.

## 2023-08-25 ENCOUNTER — Encounter (HOSPITAL_COMMUNITY)
Admission: RE | Admit: 2023-08-25 | Discharge: 2023-08-25 | Disposition: A | Discharge status: Home / Self Care | Source: Ambulatory Visit | Attending: Cardiology | Admitting: Cardiology

## 2023-08-25 VITALS — BP 102/64 | HR 60 | Ht 71.0 in | Wt 167.1 lb

## 2023-08-25 DIAGNOSIS — Z951 Presence of aortocoronary bypass graft: Secondary | ICD-10-CM | POA: Insufficient documentation

## 2023-08-25 NOTE — Progress Notes (Signed)
 Cardiac Rehab Medication Review   Does the patient  feel that his/her medications are working for him/her?  yes  Has the patient been experiencing any side effects to the medications prescribed?  no  Does the patient measure his/her own blood pressure or blood glucose at home?  yes   Does the patient have any problems obtaining medications due to transportation or finances?   no  Understanding of regimen: excellent Understanding of indications: excellent Potential of compliance: excellent    Comments: Pt checks blood pressure/blood sugars daily. No questions regarding medications today.    Paul Ryan 08/25/2023 8:04 AM

## 2023-08-25 NOTE — Progress Notes (Signed)
 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 E11.9, Disp: 100 strip, Rfl: 1   Accu-Chek Softclix Lancets lancets, USE AS DIRECTED TO CHECK SUGAR ONCE DAILY.E11.9, Disp: 100 each, Rfl: 1   acetaminophen  (TYLENOL ) 325 MG tablet, Take 2 tablets (650 mg total) by mouth every 6 (six) hours as needed for mild pain (pain score 1-3)., Disp: , Rfl:    ALPHA LIPOIC ACID PO, Take 1 capsule by mouth daily., Disp: , Rfl:    aspirin  EC 81 MG tablet, Take 1 tablet (81 mg total) by mouth daily. Swallow whole., Disp: , Rfl:    Blood Glucose Monitoring Suppl (ONETOUCH VERIO FLEX SYSTEM) w/Device KIT, Use to check blood sugar once a day. DX E11.9, Disp: 1 kit, Rfl: 0   cetirizine  (ZYRTEC ) 10 MG tablet, Take 10 mg by mouth at bedtime., Disp: , Rfl:    Cholecalciferol (VITAMIN D -3 PO), Take 1 capsule by mouth daily., Disp: , Rfl:    Coenzyme Q10 (COQ10) 100 MG CAPS, Take 1 capsule by mouth daily., Disp: , Rfl:    DULoxetine  (CYMBALTA ) 60 MG capsule, Take 60 mg by mouth in the morning., Disp: , Rfl:    Evolocumab  (REPATHA  SURECLICK) 140 MG/ML SOAJ, Inject 140 mg into the skin every 14 (fourteen) days., Disp: 6 mL, Rfl: 3   famotidine  (PEPCID ) 40 MG tablet, Take 40 mg by mouth daily., Disp: , Rfl:    MAGNESIUM  PO, Take 1 capsule by mouth daily., Disp: , Rfl:    metoprolol  tartrate (LOPRESSOR ) 25  MG tablet, Take 0.5 tablets (12.5 mg total) by mouth 2 (two) times daily., Disp: 30 tablet, Rfl: 1   Misc Natural Products (MAGIC MUSHROOM MIX) CAPS, Take 1 capsule by mouth daily. 5-in-1 Mushroom Complex, Disp: , Rfl:    pantoprazole  (PROTONIX ) 40 MG tablet, TAKE 1 TABLET BY MOUTH EVERY DAY, Disp: 90 tablet, Rfl: 1   rosuvastatin  (CRESTOR ) 10 MG tablet, Take 1 tablet (10 mg total) by mouth every evening., Disp: , Rfl:    thyroid  (ARMOUR THYROID ) 60 MG tablet, TAKE ONE TABLET BY MOUTH ON MONDAY, WEDNESDAY, THURSDAY, FRIDAY, AND SUNDAY., Disp: 65 tablet, Rfl: 1   tiZANidine  (ZANAFLEX ) 2 MG tablet, Take 0.5-2 tablets (1-4 mg total) by mouth 2 (two) times daily as needed for muscle spasms., Disp: 30 tablet, Rfl: 1   warfarin (COUMADIN ) 4 MG tablet, Take 0.5-1 tablets (2-4 mg total) by mouth See admin instructions. 4 mg on Sun Mon Wed Fri and 2 mg Tues Thurs and Sat, Disp: 90 tablet, Rfl: 1   zinc gluconate 50 MG tablet, Take 50 mg by mouth daily., Disp: , Rfl:    empagliflozin  (JARDIANCE ) 10 MG TABS tablet, Take 1 tablet (10 mg total) by mouth daily before breakfast. 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   Hyperglycemia 12/26/2014   Hyperlipidemia    Hypothyroid 06/10/2011   Insomnia 04/24/2016   Left hip pain 12/17/2010   MCI (mild cognitive impairment) 07/22/2016   Nasal lesion 07/22/2016   Peripheral vascular disease ([name])    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   Polymyalgia  ([name]) 03/21/2014   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   Preventative health care 12/17/2010   Shingles 1982   right abdominal wall   Sleep apnea    Staph skin infection 07/22/2016   Thyroid  nodule 11/29/2014   Urinary hesitancy 09/30/2016   Vitamin D  deficiency 10/14/2011    Tobacco Use: Social History   Tobacco Use  Smoking Status Former   Current packs/day: 0.00   Average packs/day: 1 pack/day for 20.0 years (20.0 ttl pk-yrs)   Types: Cigarettes   Start date: 03/03/1960   Quit date: 03/03/1980   Years since quitting: 43.5  Smokeless Tobacco Never    Labs: Review Flowsheet  More data exists      Latest Ref Rng & Units 04/14/2022 08/14/2022 06/22/2023 07/01/2023 07/03/2023  Labs for ITP Cardiac and Pulmonary Rehab  Cholestrol 0 - 200 mg/dL 798  - 77  - -  LDL (calc) 0 - 99 mg/dL 870  - 30  - -  HDL-C >60.99 mg/dL 63.09  - 68.39  - -  Trlycerides 0.0 - 149.0 mg/dL 825.9  - 22.9  - -  Hemoglobin A1c 4.8 - 5.6 % 6.8  6.7  6.8  6.3  -  PH, Arterial 7.35 - 7.45 - - - - 7.336  7.318  7.366  7.351  7.365  7.341  7.323   PCO2 arterial 32 - 48 mmHg - - - - 40.5  45.0  36.9  40.5  39.2  43.1  40.1   Bicarbonate 20.0 - 28.0 mmol/L - - - - 21.7  23.1  21.3  22.4  22.4  23.5  23.3  20.8   TCO2 22 - 32 mmol/L - - - - 23  25  22  24  25  24  24  25  25  22  22  24    Acid-base deficit 0.0 - 2.0 mmol/L - - - - 4.0  3.0  4.0  3.0  3.0  3.0  2.0  5.0   O2 Saturation % - - - - 98  98  99  100  100  89  100  100     Details       Multiple values from one day are sorted in reverse-chronological order         Capillary Blood Glucose: Lab Results  Component Value Date   GLUCAP 123 (H) 07/08/2023   GLUCAP 108 (H) 07/08/2023   GLUCAP 117 (H) 07/07/2023   GLUCAP 152 (H) 07/07/2023   GLUCAP 104 (H) 07/07/2023     Exercise Target Goals: Exercise Program Goal: Individual exercise prescription set using results from initial 6 min walk test and THRR while  considering  patient's activity barriers and  safety.   Exercise Prescription Goal: Initial exercise prescription builds to 30-45 minutes a day of aerobic activity, 2-3 days per week.  Home exercise guidelines will be given to patient during program as part of exercise prescription that the participant will acknowledge.  Activity Barriers & Risk Stratification:  Activity Barriers & Cardiac Risk Stratification - 08/25/23 0825       Activity Barriers & Cardiac Risk Stratification   Activity Barriers Arthritis;Joint Problems;Neck/Spine Problems;Back Problems;Balance Concerns    Cardiac Risk Stratification High          6 Minute Walk:  6 Minute Walk     Row Name 08/25/23 0950         6 Minute Walk   Phase Initial     Distance 1912 feet     Walk Time 6 minutes     # of Rest Breaks 0     MPH 3.62     METS 3.7     RPE 11     Perceived Dyspnea  0     VO2 Peak 12.9     Symptoms No     Resting HR 60 bpm     Resting BP 102/64     Resting Oxygen Saturation  100 %     Exercise Oxygen Saturation  during 6 min walk 97 %     Max Ex. HR 98 bpm     Max Ex. BP 116/70     2 Minute Post BP 108/70        Oxygen Initial Assessment:   Oxygen Re-Evaluation:   Oxygen Discharge (Final Oxygen Re-Evaluation):   Initial Exercise Prescription:  Initial Exercise Prescription - 08/25/23 0800       Date of Initial Exercise RX and Referring Provider   Date 08/25/23    Referring Provider [name], MD    Expected Discharge Date 11/18/23      Bike   Level 2    Watts 40    Minutes 15    METs 3.7      Recumbant Elliptical   Level 2    RPM 60    Watts 75    Minutes 15    METs 3.7      Prescription Details   Frequency (times per week) 3    Duration Progress to 30 minutes of continuous aerobic without signs/symptoms of physical distress      Intensity   THRR 40-80% of Max Heartrate 56-113    Ratings of Perceived Exertion 11-13    Perceived Dyspnea 0-4      Progression    Progression Continue progressive overload as per policy without signs/symptoms or physical distress.      Resistance Training   Training Prescription Yes    Weight 4 lbs    Reps 10-15          Perform Capillary Blood Glucose checks as needed.  Exercise Prescription Changes:   Exercise Comments:   Exercise Goals and Review:   Exercise Goals     Row Name 08/25/23 0809             Exercise Goals   Increase Physical Activity Yes       Intervention Develop an individualized exercise prescription for aerobic and resistive training based on initial evaluation findings, risk stratification, comorbidities and participant's personal goals.;Provide advice, education, support and counseling about physical activity/exercise needs.       Expected Outcomes Short Term: Attend rehab on a regular basis to increase amount of physical activity.;Long  Term: Exercising regularly at least 3-5 days a week.;Long Term: Add in home exercise to make exercise part of routine and to increase amount of physical activity.       Increase Strength and Stamina Yes       Intervention Provide advice, education, support and counseling about physical activity/exercise needs.;Develop an individualized exercise prescription for aerobic and resistive training based on initial evaluation findings, risk stratification, comorbidities and participant's personal goals.       Expected Outcomes Short Term: Increase workloads from initial exercise prescription for resistance, speed, and METs.;Short Term: Perform resistance training exercises routinely during rehab and add in resistance training at home;Long Term: Improve cardiorespiratory fitness, muscular endurance and strength as measured by increased METs and functional capacity ( )       Able to understand and use rate of perceived exertion (RPE) scale Yes       Intervention Provide education and explanation on how to use RPE scale       Expected Outcomes Short Term: Able  to use RPE daily in rehab to express subjective intensity level;Long Term:  Able to use RPE to guide intensity level when exercising independently       Knowledge and understanding of Target Heart Rate Range (THRR) Yes       Intervention Provide education and explanation of THRR including how the numbers were predicted and where they are located for reference       Expected Outcomes Short Term: Able to state/look up THRR;Long Term: Able to use THRR to govern intensity when exercising independently;Short Term: Able to use daily as guideline for intensity in rehab       Understanding of Exercise Prescription Yes       Intervention Provide education, explanation, and written materials on patient's individual exercise prescription       Expected Outcomes Short Term: Able to explain program exercise prescription;Long Term: Able to explain home exercise prescription to exercise independently          Exercise Goals Re-Evaluation :   Discharge Exercise Prescription (Final Exercise Prescription Changes):   Nutrition:  Target Goals: Understanding of nutrition guidelines, daily intake of sodium 1500mg , cholesterol 200mg , calories 30% from fat and 7% or less from saturated fats, daily to have 5 or more servings of fruits and vegetables.  Biometrics:   Post Biometrics - 08/25/23 0814        Post  Biometrics   Waist Circumference 37 inches    Hip Circumference 39 inches    Waist to Hip Ratio 0.95 %    Triceps Skinfold 10 mm    % Body Fat 23.1 %    Grip Strength 37 kg    Flexibility 13.3 in    Single Leg Stand 12.87 seconds          Nutrition Therapy Plan and Nutrition Goals:   Nutrition Assessments:  MEDIFICTS Score Key: >=70 Need to make dietary changes  40-70 Heart Healthy Diet <= 40 Therapeutic Level Cholesterol Diet    Picture Your Plate Scores: <59 Unhealthy dietary pattern with much room for improvement. 41-50 Dietary pattern unlikely to meet recommendations for good  health and room for improvement. 51-60 More healthful dietary pattern, with some room for improvement.  >60 Healthy dietary pattern, although there may be some specific behaviors that could be improved.    Nutrition Goals Re-Evaluation:   Nutrition Goals Re-Evaluation:   Nutrition Goals Discharge (Final Nutrition Goals Re-Evaluation):   Psychosocial: Target Goals: Acknowledge presence or absence of significant  depression and/or stress, maximize coping skills, provide positive support system. Participant is able to verbalize types and ability to use techniques and skills needed for reducing stress and depression.  Initial Review & Psychosocial Screening:  Initial Psych Review & Screening - 08/25/23 0811       Initial Review   Current issues with None Identified   Pt is on cymbalta      Family Dynamics   Good Support System? Yes   Pt has spouse and daughter for support     Barriers   Psychosocial barriers to participate in program There are no identifiable barriers or psychosocial needs.      Screening Interventions   Interventions Encouraged to exercise          Quality of Life Scores:  Quality of Life - 08/25/23 0948       Quality of Life   Select Quality of Life      Quality of Life Scores   Health/Function Pre 29.2 %    Socioeconomic Pre 30 %    Psych/Spiritual Pre 28.93 %    Family Pre 30 %    GLOBAL Pre 29.43 %         Scores of 19 and below usually indicate a poorer quality of life in these areas.  A difference of  2-3 points is a clinically meaningful difference.  A difference of 2-3 points in the total score of the Quality of Life Index has been associated with significant improvement in overall quality of life, self-image, physical symptoms, and general health in studies assessing change in quality of life.  PHQ-9: Review Flowsheet  More data exists      08/25/2023 07/10/2023 03/03/2023 04/15/2022 02/19/2022  Depression screen PHQ 2/9  Decreased  Interest 0 0 0 0 0  Down, Depressed, Hopeless 0 0 0 0 0  PHQ - 2 Score 0 0 0 0 0  Altered sleeping 0 - - 0 -  Tired, decreased energy 0 - - 0 -  Change in appetite 0 - - 0 -  Feeling bad or failure about yourself  0 - - 0 -  Trouble concentrating 0 - - 0 -  Moving slowly or fidgety/restless 0 - - 0 -  Suicidal thoughts - - - 0 -  PHQ-9 Score 0 - - 0 -  Difficult doing work/chores Not difficult at all - - Not difficult at all -   Interpretation of Total Score  Total Score Depression Severity:  1-4 = Minimal depression, 5-9 = Mild depression, 10-14 = Moderate depression, 15-19 = Moderately severe depression, 20-27 = Severe depression   Psychosocial Evaluation and Intervention:   Psychosocial Re-Evaluation:   Psychosocial Discharge (Final Psychosocial Re-Evaluation):   Vocational Rehabilitation: Provide vocational rehab assistance to qualifying candidates.   Vocational Rehab Evaluation & Intervention:  Vocational Rehab - 08/25/23 9188       Initial Vocational Rehab Evaluation & Intervention   Assessment shows need for Vocational Rehabilitation No   Pt is retired         Education: Education Goals: Education classes will be provided on a weekly basis, covering required topics. Participant will state understanding/return demonstration of topics presented.     Core Videos: Exercise    Move It!  Clinical staff conducted group or individual video education with verbal and written material and guidebook.  Patient learns the recommended Pritikin exercise program. Exercise with the goal of living a long, healthy life. Some of the health benefits of exercise include controlled diabetes,  healthier blood pressure levels, improved cholesterol levels, improved heart and lung capacity, improved sleep, and better body composition. Everyone should speak with their doctor before starting or changing an exercise routine.  Biomechanical Limitations Clinical staff conducted group or  individual video education with verbal and written material and guidebook.  Patient learns how biomechanical limitations can impact exercise and how we can mitigate and possibly overcome limitations to have an impactful and balanced exercise routine.  Body Composition Clinical staff conducted group or individual video education with verbal and written material and guidebook.  Patient learns that body composition (ratio of muscle mass to fat mass) is a key component to assessing overall fitness, rather than body weight alone. Increased fat mass, especially visceral belly fat, can put us  at increased risk for metabolic syndrome, type 2 diabetes, heart disease, and even death. It is recommended to combine diet and exercise (cardiovascular and resistance training) to improve your body composition. Seek guidance from your physician and exercise physiologist before implementing an exercise routine.  Exercise Action Plan Clinical staff conducted group or individual video education with verbal and written material and guidebook.  Patient learns the recommended strategies to achieve and enjoy long-term exercise adherence, including variety, self-motivation, self-efficacy, and positive decision making. Benefits of exercise include fitness, good health, weight management, more energy, better sleep, less stress, and overall well-being.  Medical   Heart Disease Risk Reduction Clinical staff conducted group or individual video education with verbal and written material and guidebook.  Patient learns our heart is our most vital organ as it circulates oxygen, nutrients, white blood cells, and hormones throughout the entire body, and carries waste away. Data supports a plant-based eating plan like the Pritikin Program for its effectiveness in slowing progression of and reversing heart disease. The video provides a number of recommendations to address heart disease.   Metabolic Syndrome and Belly Fat  Clinical staff  conducted group or individual video education with verbal and written material and guidebook.  Patient learns what metabolic syndrome is, how it leads to heart disease, and how one can reverse it and keep it from coming back. You have metabolic syndrome if you have 3 of the following 5 criteria: abdominal obesity, high blood pressure, high triglycerides, low HDL cholesterol, and high blood sugar.  Hypertension and Heart Disease Clinical staff conducted group or individual video education with verbal and written material and guidebook.  Patient learns that high blood pressure, or hypertension, is very common in the [place] . Hypertension is largely due to excessive salt intake, but other important risk factors include being overweight, physical inactivity, drinking too much alcohol, smoking, and not eating enough potassium from fruits and vegetables. High blood pressure is a leading risk factor for heart attack, stroke, congestive heart failure, dementia, kidney failure, and premature death. Long-term effects of excessive salt intake include stiffening of the arteries and thickening of heart muscle and organ damage. Recommendations include ways to reduce hypertension and the risk of heart disease.  Diseases of Our Time - Focusing on Diabetes Clinical staff conducted group or individual video education with verbal and written material and guidebook.  Patient learns why the best way to stop diseases of our time is prevention, through food and other lifestyle changes. Medicine (such as prescription pills and surgeries) is often only a Band-Aid on the problem, not a long-term solution. Most common diseases of our time include obesity, type 2 diabetes, hypertension, heart disease, and cancer. The Pritikin Program is recommended and has been proven  to help reduce, reverse, and/or prevent the damaging effects of metabolic syndrome.  Nutrition   Overview of the Pritikin Eating Plan  Clinical staff conducted  group or individual video education with verbal and written material and guidebook.  Patient learns about the Pritikin Eating Plan for disease risk reduction. The Pritikin Eating Plan emphasizes a wide variety of unrefined, minimally-processed carbohydrates, like fruits, vegetables, whole grains, and legumes. Go, Caution, and Stop food choices are explained. Plant-based and lean animal proteins are emphasized. Rationale provided for low sodium intake for blood pressure control, low added sugars for blood sugar stabilization, and low added fats and oils for coronary artery disease risk reduction and weight management.  Calorie Density  Clinical staff conducted group or individual video education with verbal and written material and guidebook.  Patient learns about calorie density and how it impacts the Pritikin Eating Plan. Knowing the characteristics of the food you choose will help you decide whether those foods will lead to weight gain or weight loss, and whether you want to consume more or less of them. Weight loss is usually a side effect of the Pritikin Eating Plan because of its focus on low calorie-dense foods.  Label Reading  Clinical staff conducted group or individual video education with verbal and written material and guidebook.  Patient learns about the Pritikin recommended label reading guidelines and corresponding recommendations regarding calorie density, added sugars, sodium content, and whole grains.  Dining Out - Part 1  Clinical staff conducted group or individual video education with verbal and written material and guidebook.  Patient learns that restaurant meals can be sabotaging because they can be so high in calories, fat, sodium, and/or sugar. Patient learns recommended strategies on how to positively address this and avoid unhealthy pitfalls.  Facts on Fats  Clinical staff conducted group or individual video education with verbal and written material and guidebook.  Patient  learns that lifestyle modifications can be just as effective, if not more so, as many medications for lowering your risk of heart disease. A Pritikin lifestyle can help to reduce your risk of inflammation and atherosclerosis (cholesterol build-up, or plaque, in the artery walls). Lifestyle interventions such as dietary choices and physical activity address the cause of atherosclerosis. A review of the types of fats and their impact on blood cholesterol levels, along with dietary recommendations to reduce fat intake is also included.  Nutrition Action Plan  Clinical staff conducted group or individual video education with verbal and written material and guidebook.  Patient learns how to incorporate Pritikin recommendations into their lifestyle. Recommendations include planning and keeping personal health goals in mind as an important part of their success.  Healthy Mind-Set    Healthy Minds, Bodies, Hearts  Clinical staff conducted group or individual video education with verbal and written material and guidebook.  Patient learns how to identify when they are stressed. Video will discuss the impact of that stress, as well as the many benefits of stress management. Patient will also be introduced to stress management techniques. The way we think, act, and feel has an impact on our hearts.  How Our Thoughts Can Heal Our Hearts  Clinical staff conducted group or individual video education with verbal and written material and guidebook.  Patient learns that negative thoughts can cause depression and anxiety. This can result in negative lifestyle behavior and serious health problems. Cognitive behavioral therapy is an effective method to help control our thoughts in order to change and improve our emotional outlook.  Additional Videos:  Exercise    Improving Performance  Clinical staff conducted group or individual video education with verbal and written material and guidebook.  Patient learns to use a  non-linear approach by alternating intensity levels and lengths of time spent exercising to help burn more calories and lose more body fat. Cardiovascular exercise helps improve heart health, metabolism, hormonal balance, blood sugar control, and recovery from fatigue. Resistance training improves strength, endurance, balance, coordination, reaction time, metabolism, and muscle mass. Flexibility exercise improves circulation, posture, and balance. Seek guidance from your physician and exercise physiologist before implementing an exercise routine and learn your capabilities and proper form for all exercise.  Introduction to Yoga  Clinical staff conducted group or individual video education with verbal and written material and guidebook.  Patient learns about yoga, a discipline of the coming together of mind, breath, and body. The benefits of yoga include improved flexibility, improved range of motion, better posture and core strength, increased lung function, weight loss, and positive self-image. Yoga's heart health benefits include lowered blood pressure, healthier heart rate, decreased cholesterol and triglyceride levels, improved immune function, and reduced stress. Seek guidance from your physician and exercise physiologist before implementing an exercise routine and learn your capabilities and proper form for all exercise.  Medical   Aging: Enhancing Your Quality of Life  Clinical staff conducted group or individual video education with verbal and written material and guidebook.  Patient learns key strategies and recommendations to stay in good physical health and enhance quality of life, such as prevention strategies, having an advocate, securing a Health Care Proxy and Power of Attorney, and keeping a list of medications and system for tracking them. It also discusses how to avoid risk for bone loss.  Biology of Weight Control  Clinical staff conducted group or individual video education with  verbal and written material and guidebook.  Patient learns that weight gain occurs because we consume more calories than we burn (eating more, moving less). Even if your body weight is normal, you may have higher ratios of fat compared to muscle mass. Too much body fat puts you at increased risk for cardiovascular disease, heart attack, stroke, type 2 diabetes, and obesity-related cancers. In addition to exercise, following the Pritikin Eating Plan can help reduce your risk.  Decoding Lab Results  Clinical staff conducted group or individual video education with verbal and written material and guidebook.  Patient learns that lab test reflects one measurement whose values change over time and are influenced by many factors, including medication, stress, sleep, exercise, food, hydration, pre-existing medical conditions, and more. It is recommended to use the knowledge from this video to become more involved with your lab results and evaluate your numbers to speak with your doctor.   Diseases of Our Time - Overview  Clinical staff conducted group or individual video education with verbal and written material and guidebook.  Patient learns that according to the CDC, 50% to 70% of chronic diseases (such as obesity, type 2 diabetes, elevated lipids, hypertension, and heart disease) are avoidable through lifestyle improvements including healthier food choices, listening to satiety cues, and increased physical activity.  Sleep Disorders Clinical staff conducted group or individual video education with verbal and written material and guidebook.  Patient learns how good quality and duration of sleep are important to overall health and well-being. Patient also learns about sleep disorders and how they impact health along with recommendations to address them, including discussing with a physician.  Nutrition  Dining  Out - Part 2 Clinical staff conducted group or individual video education with verbal and  written material and guidebook.  Patient learns how to plan ahead and communicate in order to maximize their dining experience in a healthy and nutritious manner. Included are recommended food choices based on the type of restaurant the patient is visiting.   Fueling a Banker conducted group or individual video education with verbal and written material and guidebook.  There is a strong connection between our food choices and our health. Diseases like obesity and type 2 diabetes are very prevalent and are in large-part due to lifestyle choices. The Pritikin Eating Plan provides plenty of food and hunger-curbing satisfaction. It is easy to follow, affordable, and helps reduce health risks.  Menu Workshop  Clinical staff conducted group or individual video education with verbal and written material and guidebook.  Patient learns that restaurant meals can sabotage health goals because they are often packed with calories, fat, sodium, and sugar. Recommendations include strategies to plan ahead and to communicate with the manager, chef, or server to help order a healthier meal.  Planning Your Eating Strategy  Clinical staff conducted group or individual video education with verbal and written material and guidebook.  Patient learns about the Pritikin Eating Plan and its benefit of reducing the risk of disease. The Pritikin Eating Plan does not focus on calories. Instead, it emphasizes high-quality, nutrient-rich foods. By knowing the characteristics of the foods, we choose, we can determine their calorie density and make informed decisions.  Targeting Your Nutrition Priorities  Clinical staff conducted group or individual video education with verbal and written material and guidebook.  Patient learns that lifestyle habits have a tremendous impact on disease risk and progression. This video provides eating and physical activity recommendations based on your personal health goals,  such as reducing LDL cholesterol, losing weight, preventing or controlling type 2 diabetes, and reducing high blood pressure.  Vitamins and Minerals  Clinical staff conducted group or individual video education with verbal and written material and guidebook.  Patient learns different ways to obtain key vitamins and minerals, including through a recommended healthy diet. It is important to discuss all supplements you take with your doctor.   Healthy Mind-Set    Smoking Cessation  Clinical staff conducted group or individual video education with verbal and written material and guidebook.  Patient learns that cigarette smoking and tobacco addiction pose a serious health risk which affects millions of people. Stopping smoking will significantly reduce the risk of heart disease, lung disease, and many forms of cancer. Recommended strategies for quitting are covered, including working with your doctor to develop a successful plan.  Culinary   Becoming a Set designer conducted group or individual video education with verbal and written material and guidebook.  Patient learns that cooking at home can be healthy, cost-effective, quick, and puts them in control. Keys to cooking healthy recipes will include looking at your recipe, assessing your equipment needs, planning ahead, making it simple, choosing cost-effective seasonal ingredients, and limiting the use of added fats, salts, and sugars.  Cooking - Breakfast and Snacks  Clinical staff conducted group or individual video education with verbal and written material and guidebook.  Patient learns how important breakfast is to satiety and nutrition through the entire day. Recommendations include key foods to eat during breakfast to help stabilize blood sugar levels and to prevent overeating at meals later in the day. Planning ahead is also  a key component.  Cooking - Educational psychologist conducted group or individual video  education with verbal and written material and guidebook.  Patient learns eating strategies to improve overall health, including an approach to cook more at home. Recommendations include thinking of animal protein as a side on your plate rather than center stage and focusing instead on lower calorie dense options like vegetables, fruits, whole grains, and plant-based proteins, such as beans. Making sauces in large quantities to freeze for later and leaving the skin on your vegetables are also recommended to maximize your experience.  Cooking - Healthy Salads and Dressing Clinical staff conducted group or individual video education with verbal and written material and guidebook.  Patient learns that vegetables, fruits, whole grains, and legumes are the foundations of the Pritikin Eating Plan. Recommendations include how to incorporate each of these in flavorful and healthy salads, and how to create homemade salad dressings. Proper handling of ingredients is also covered. Cooking - Soups and [employer] - Soups and Desserts Clinical staff conducted group or individual video education with verbal and written material and guidebook.  Patient learns that Pritikin soups and desserts make for easy, nutritious, and delicious snacks and meal components that are low in sodium, fat, sugar, and calorie density, while high in vitamins, minerals, and filling fiber. Recommendations include simple and healthy ideas for soups and desserts.   Overview     The Pritikin Solution Program Overview Clinical staff conducted group or individual video education with verbal and written material and guidebook.  Patient learns that the results of the Pritikin Program have been documented in more than 100 articles published in peer-reviewed journals, and the benefits include reducing risk factors for (and, in some cases, even reversing) high cholesterol, high blood pressure, type 2 diabetes, obesity, and more! An overview of  the three key pillars of the Pritikin Program will be covered: eating well, doing regular exercise, and having a healthy mind-set.  WORKSHOPS  Exercise: Exercise Basics: Building Your Action Plan Clinical staff led group instruction and group discussion with PowerPoint presentation and patient guidebook. To enhance the learning environment the use of posters, models and videos may be added. At the conclusion of this workshop, patients will comprehend the difference between physical activity and exercise, as well as the benefits of incorporating both, into their routine. Patients will understand the FITT (Frequency, Intensity, Time, and Type) principle and how to use it to build an exercise action plan. In addition, safety concerns and other considerations for exercise and cardiac rehab will be addressed by the presenter. The purpose of this lesson is to promote a comprehensive and effective weekly exercise routine in order to improve patients' overall level of fitness.   Managing Heart Disease: Your Path to a Healthier Heart Clinical staff led group instruction and group discussion with PowerPoint presentation and patient guidebook. To enhance the learning environment the use of posters, models and videos may be added.At the conclusion of this workshop, patients will understand the anatomy and physiology of the heart. Additionally, they will understand how Pritikin's three pillars impact the risk factors, the progression, and the management of heart disease.  The purpose of this lesson is to provide a high-level overview of the heart, heart disease, and how the Pritikin lifestyle positively impacts risk factors.  Exercise Biomechanics Clinical staff led group instruction and group discussion with PowerPoint presentation and patient guidebook. To enhance the learning environment the use of posters, models and videos may  be added. Patients will learn how the structural parts of their bodies  function and how these functions impact their daily activities, movement, and exercise. Patients will learn how to promote a neutral spine, learn how to manage pain, and identify ways to improve their physical movement in order to promote healthy living. The purpose of this lesson is to expose patients to common physical limitations that impact physical activity. Participants will learn practical ways to adapt and manage aches and pains, and to minimize their effect on regular exercise. Patients will learn how to maintain good posture while sitting, walking, and lifting.  Balance Training and Fall Prevention  Clinical staff led group instruction and group discussion with PowerPoint presentation and patient guidebook. To enhance the learning environment the use of posters, models and videos may be added. At the conclusion of this workshop, patients will understand the importance of their sensorimotor skills (vision, proprioception, and the vestibular system) in maintaining their ability to balance as they age. Patients will apply a variety of balancing exercises that are appropriate for their current level of function. Patients will understand the common causes for poor balance, possible solutions to these problems, and ways to modify their physical environment in order to minimize their fall risk. The purpose of this lesson is to teach patients about the importance of maintaining balance as they age and ways to minimize their risk of falling.  WORKSHOPS   Nutrition:  Fueling a Ship broker led group instruction and group discussion with PowerPoint presentation and patient guidebook. To enhance the learning environment the use of posters, models and videos may be added. Patients will review the foundational principles of the Pritikin Eating Plan and understand what constitutes a serving size in each of the food groups. Patients will also learn Pritikin-friendly foods that are better  choices when away from home and review make-ahead meal and snack options. Calorie density will be reviewed and applied to three nutrition priorities: weight maintenance, weight loss, and weight gain. The purpose of this lesson is to reinforce (in a group setting) the key concepts around what patients are recommended to eat and how to apply these guidelines when away from home by planning and selecting Pritikin-friendly options. Patients will understand how calorie density may be adjusted for different weight management goals.  Mindful Eating  Clinical staff led group instruction and group discussion with PowerPoint presentation and patient guidebook. To enhance the learning environment the use of posters, models and videos may be added. Patients will briefly review the concepts of the Pritikin Eating Plan and the importance of low-calorie dense foods. The concept of mindful eating will be introduced as well as the importance of paying attention to internal hunger signals. Triggers for non-hunger eating and techniques for dealing with triggers will be explored. The purpose of this lesson is to provide patients with the opportunity to review the basic principles of the Pritikin Eating Plan, discuss the value of eating mindfully and how to measure internal cues of hunger and fullness using the Hunger Scale. Patients will also discuss reasons for non-hunger eating and learn strategies to use for controlling emotional eating.  Targeting Your Nutrition Priorities Clinical staff led group instruction and group discussion with PowerPoint presentation and patient guidebook. To enhance the learning environment the use of posters, models and videos may be added. Patients will learn how to determine their genetic susceptibility to disease by reviewing their family history. Patients will gain insight into the importance of diet as part of  an overall healthy lifestyle in mitigating the impact of genetics and other  environmental insults. The purpose of this lesson is to provide patients with the opportunity to assess their personal nutrition priorities by looking at their family history, their own health history and current risk factors. Patients will also be able to discuss ways of prioritizing and modifying the Pritikin Eating Plan for their highest risk areas  Menu  Clinical staff led group instruction and group discussion with PowerPoint presentation and patient guidebook. To enhance the learning environment the use of posters, models and videos may be added. Using menus brought in from [employer], or printed from [employer], patients will apply the Pritikin dining out guidelines that were presented in the [employer] video. Patients will also be able to practice these guidelines in a variety of provided scenarios. The purpose of this lesson is to provide patients with the opportunity to practice hands-on learning of the Pritikin Dining Out guidelines with actual menus and practice scenarios.  Label Reading Clinical staff led group instruction and group discussion with PowerPoint presentation and patient guidebook. To enhance the learning environment the use of posters, models and videos may be added. Patients will review and discuss the Pritikin label reading guidelines presented in Pritikin's Label Reading Educational series video. Using fool labels brought in from local grocery stores and markets, patients will apply the label reading guidelines and determine if the packaged food meet the Pritikin guidelines. The purpose of this lesson is to provide patients with the opportunity to review, discuss, and practice hands-on learning of the Pritikin Label Reading guidelines with actual packaged food labels. Cooking School  Pritikin's [employer] are designed to teach patients ways to prepare quick, simple, and affordable recipes at home. The importance of nutrition's role in  chronic disease risk reduction is reflected in its emphasis in the overall Pritikin program. By learning how to prepare essential core Pritikin Eating Plan recipes, patients will increase control over what they eat; be able to customize the flavor of foods without the use of added salt, sugar, or fat; and improve the quality of the food they consume. By learning a set of core recipes which are easily assembled, quickly prepared, and affordable, patients are more likely to prepare more healthy foods at home. These workshops focus on convenient breakfasts, simple entres, side dishes, and desserts which can be prepared with minimal effort and are consistent with nutrition recommendations for cardiovascular risk reduction. Cooking [employer] are taught by a Armed forces logistics/support/administrative officer (RD) who has been trained by the [employer]. The chef or RD has a clear understanding of the importance of minimizing - if not completely eliminating - added fat, sugar, and sodium in recipes. Throughout the series of Cooking School Workshop sessions, patients will learn about healthy ingredients and efficient methods of cooking to build confidence in their capability to prepare    Cooking School weekly topics:  Adding Flavor- Sodium-Free  Fast and Healthy Breakfasts  Powerhouse Plant-Based Proteins  Satisfying Salads and Dressings  Simple Sides and Sauces  International Cuisine-Spotlight on the [employer] Zones  Delicious Desserts  Savory Soups  [employer] - Meals in a Astronomer Appetizers and Snacks  Comforting Weekend Breakfasts  One-Pot Wonders   Fast Evening Meals  Landscape architect Your Pritikin Plate  WORKSHOPS   Healthy Mindset (Psychosocial):  Focused Goals, Sustainable Changes Clinical staff led group instruction and group discussion with PowerPoint presentation and patient  guidebook. To enhance the learning environment the use of posters, models and videos may  be added. Patients will be able to apply effective goal setting strategies to establish at least one personal goal, and then take consistent, meaningful action toward that goal. They will learn to identify common barriers to achieving personal goals and develop strategies to overcome them. Patients will also gain an understanding of how our mind-set can impact our ability to achieve goals and the importance of cultivating a positive and growth-oriented mind-set. The purpose of this lesson is to provide patients with a deeper understanding of how to set and achieve personal goals, as well as the tools and strategies needed to overcome common obstacles which may arise along the way.  From Head to Heart: The Power of a Healthy Outlook  Clinical staff led group instruction and group discussion with PowerPoint presentation and patient guidebook. To enhance the learning environment the use of posters, models and videos may be added. Patients will be able to recognize and describe the impact of emotions and mood on physical health. They will discover the importance of self-care and explore self-care practices which may work for them. Patients will also learn how to utilize the 4 C's to cultivate a healthier outlook and better manage stress and challenges. The purpose of this lesson is to demonstrate to patients how a healthy outlook is an essential part of maintaining good health, especially as they continue their cardiac rehab journey.  Healthy Sleep for a Healthy Heart Clinical staff led group instruction and group discussion with PowerPoint presentation and patient guidebook. To enhance the learning environment the use of posters, models and videos may be added. At the conclusion of this workshop, patients will be able to demonstrate knowledge of the importance of sleep to overall health, well-being, and quality of life. They will understand the symptoms of, and treatments for, common sleep disorders. Patients  will also be able to identify daytime and nighttime behaviors which impact sleep, and they will be able to apply these tools to help manage sleep-related challenges. The purpose of this lesson is to provide patients with a general overview of sleep and outline the importance of quality sleep. Patients will learn about a few of the most common sleep disorders. Patients will also be introduced to the concept of "sleep hygiene," and discover ways to self-manage certain sleeping problems through simple daily behavior changes. Finally, the workshop will motivate patients by clarifying the links between quality sleep and their goals of heart-healthy living.   Recognizing and Reducing Stress Clinical staff led group instruction and group discussion with PowerPoint presentation and patient guidebook. To enhance the learning environment the use of posters, models and videos may be added. At the conclusion of this workshop, patients will be able to understand the types of stress reactions, differentiate between acute and chronic stress, and recognize the impact that chronic stress has on their health. They will also be able to apply different coping mechanisms, such as reframing negative self-talk. Patients will have the opportunity to practice a variety of stress management techniques, such as deep abdominal breathing, progressive muscle relaxation, and/or guided imagery.  The purpose of this lesson is to educate patients on the role of stress in their lives and to provide healthy techniques for coping with it.  Learning Barriers/Preferences:  Learning Barriers/Preferences - 08/25/23 0954       Learning Barriers/Preferences   Learning Barriers --          Education Topics:  Knowledge Questionnaire Score:  Knowledge Questionnaire Score - 08/25/23 0845       Knowledge Questionnaire Score   Pre Score 20/24          Core Components/Risk Factors/Patient Goals at Admission:  Personal Goals and Risk  Factors at Admission - 08/25/23 0845       Core Components/Risk Factors/Patient Goals on Admission    Weight Management Weight Maintenance    Diabetes Yes    Intervention Provide education about signs/symptoms and action to take for hypo/hyperglycemia.;Provide education about proper nutrition, including hydration, and aerobic/resistive exercise prescription along with prescribed medications to achieve blood glucose in normal ranges: Fasting glucose 65-99 mg/dL    Expected Outcomes Short Term: Participant verbalizes understanding of the signs/symptoms and immediate care of hyper/hypoglycemia, proper foot care and importance of medication, aerobic/resistive exercise and nutrition plan for blood glucose control.;Long Term: Attainment of HbA1C < 7%.    Hypertension Yes    Intervention Provide education on lifestyle modifcations including regular physical activity/exercise, weight management, moderate sodium restriction and increased consumption of fresh fruit, vegetables, and low fat dairy, alcohol moderation, and smoking cessation.;Monitor prescription use compliance.    Expected Outcomes Short Term: Continued assessment and intervention until BP is < 140/24mm HG in hypertensive participants. < 130/60mm HG in hypertensive participants with diabetes, heart failure or chronic kidney disease.;Long Term: Maintenance of blood pressure at goal levels.    Lipids Yes    Intervention Provide education and support for participant on nutrition & aerobic/resistive exercise along with prescribed medications to achieve LDL 70mg , HDL >40mg .    Expected Outcomes Short Term: Participant states understanding of desired cholesterol values and is compliant with medications prescribed. Participant is following exercise prescription and nutrition guidelines.;Long Term: Cholesterol controlled with medications as prescribed, with individualized exercise RX and with personalized nutrition plan. Value goals: LDL < 70mg , HDL > 40  mg.          Core Components/Risk Factors/Patient Goals Review:    Core Components/Risk Factors/Patient Goals at Discharge (Final Review):    ITP Comments:  ITP Comments     Row Name 08/25/23 0810           ITP Comments Wilbert Bihari, MD: Medical Director.  Introduction to the [facility] / Intensive Cardiac Rehab.  Initial orienation packet reviewed witht he patient.          Comments: Participant attended orientation for the cardiac rehabilitation program on  08/25/2023  to perform initial intake and exercise walk test. Patient introduced to the [facility] education and orientation packet was reviewed. Completed 6-minute walk test, measurements, initial ITP, and exercise prescription. Vital signs stable. Telemetry-normal sinus rhythm, 1st degree AVB, asymptomatic.   Service time was from 7:52 to 9:30.

## 2023-08-30 ENCOUNTER — Other Ambulatory Visit: Payer: Self-pay | Admitting: Surgical

## 2023-09-01 ENCOUNTER — Encounter: Payer: Self-pay | Admitting: Cardiology

## 2023-09-01 MED ORDER — METOPROLOL TARTRATE 25 MG PO TABS: 12.5000 mg | ORAL_TABLET | Freq: Two times a day (BID) | ORAL | 1 refills | Status: DC

## 2023-09-01 NOTE — Telephone Encounter (Signed)
 Per OV note by Thom Sluder, PA on 07/28/23:  Continue with aspirin , Lopressor  12.5 mg twice daily, Repatha , co-Q10, rosuvastatin  10 mg.   Pt also saw [facility] on 08/05/23 and no changes were made to medication regimen. Refill sent at this time and pt made aware.

## 2023-09-02 ENCOUNTER — Encounter: Attending: Surgery | Admitting: Skilled Nursing Facility1

## 2023-09-02 ENCOUNTER — Encounter: Payer: Self-pay | Admitting: Skilled Nursing Facility1

## 2023-09-02 DIAGNOSIS — E1169 Type 2 diabetes mellitus with other specified complication: Secondary | ICD-10-CM | POA: Insufficient documentation

## 2023-09-02 DIAGNOSIS — E785 Hyperlipidemia, unspecified: Secondary | ICD-10-CM | POA: Insufficient documentation

## 2023-09-02 NOTE — Progress Notes (Signed)
 Other diagnoses: Allergies OSA Hyperlipidemia GERD Thyroid  Nerve/muscle  Pt states Jardiance  has been dicussed. 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 Pt states he does not know what his A1C is.  Pt states he does check his blood sugar: fasting: 111, 112; 1-2 hours post prandial: around 130.  Pt is currently eating a plant based diet which is his usual for him. Pt is well informed and arrives with relevant informed questions.   Diabetes Self-Management Education  Visit Type: First/Initial  Appt. Start Time: 4:37 Appt. End Time: 5:40  09/02/2023  [name], identified by name and date of birth, is a 78 y.o. male with a diagnosis of Diabetes: Type 2.   ASSESSMENT  There were no vitals taken for this visit. There is no height or weight on file to calculate BMI.   Diabetes Self-Management Education - 09/02/23 1644       Visit Information   Visit Type First/Initial      Initial Visit   Diabetes Type Type 2    Are you currently following a meal plan? No    Are you taking your medications as prescribed? Not on Medications      Health Coping   How would you rate your overall health? Good      Psychosocial Assessment   Patient Belief/Attitude about Diabetes Motivated to manage diabetes    What is the hardest part about your diabetes right now, causing you the most concern, or is the most worrisome to you about your diabetes?   Making healty food and beverage choices      Complications   Number of hyperglycemic episodes ( >200mg /dL): Never    Have you had a dilated eye exam in the past 12 months? Yes    Have you had a dental exam in the past 12 months? Yes    Are you checking your feet? Yes      Dietary Intake   Breakfast oatmeal + milk + fruit      Activity / Exercise   Activity / Exercise Type Moderate (swimming / aerobic walking)    How many days per week do you exercise? 7    How many minutes per day do you  exercise? 30    Total minutes per week of exercise 210      Patient Education   Previous Diabetes Education No    Disease Pathophysiology Definition of diabetes, type 1 and 2, and the diagnosis of diabetes;Factors that contribute to the development of diabetes    Healthy Eating Role of diet in the treatment of diabetes and the relationship between the three main macronutrients and blood glucose level;Food label reading, portion sizes and measuring food.;Information on hints to eating out and maintain blood glucose control.;Reviewed blood glucose goals for pre and post meals and how to evaluate the patients' food intake on their blood glucose level.;Plate Method    Being Active Role of exercise on diabetes management, blood pressure control and cardiac health.    Medications Reviewed patients medication for diabetes, action, purpose, timing of dose and side effects.    Monitoring Taught/evaluated SMBG meter.;Daily foot exams;Taught/discussed recording of test results and interpretation of SMBG.;Purpose and frequency of SMBG.    Chronic complications Dental care;Retinopathy and reason for yearly dilated eye exams;Nephropathy, what it is, prevention of, the use of ACE, ARB's and early detection of through urine microalbumia.;Lipid levels, blood glucose control and heart disease;Assessed and discussed foot  care and prevention of foot problems      Individualized Goals (developed by patient)   Nutrition Follow meal plan discussed;General guidelines for healthy choices and portions discussed    Physical Activity Exercise 5-7 days per week;60 minutes per day;30 minutes per day;45 minutes per day    Medications take my medication as prescribed    Monitoring  Test my blood glucose as discussed;Test blood glucose pre and post meals as discussed    Reducing Risk treat hypoglycemia with 15 grams of carbs if blood glucose less than 70mg /dL      Post-Education Assessment   Patient understands the diabetes  disease and treatment process. Demonstrates understanding / competency    Patient understands incorporating nutritional management into lifestyle. Demonstrates understanding / competency    Patient undertands incorporating physical activity into lifestyle. Demonstrates understanding / competency    Patient understands using medications safely. Demonstrates understanding / competency    Patient understands monitoring blood glucose, interpreting and using results Demonstrates understanding / competency    Patient understands prevention, detection, and treatment of acute complications. Demonstrates understanding / competency    Patient understands prevention, detection, and treatment of chronic complications. Demonstrates understanding / competency    Patient understands how to develop strategies to address psychosocial issues. Demonstrates understanding / competency    Patient understands how to develop strategies to promote health/change behavior. Demonstrates understanding / competency      Outcomes   Expected Outcomes Demonstrated interest in learning. Expect positive outcomes    Future DMSE PRN    Program Status Completed          Individualized Plan for Diabetes Self-Management Training:   Learning Objective:  Patient will have a greater understanding of diabetes self-management. Patient education plan is to attend individual and/or group sessions per assessed needs and concerns.   Expected Outcomes:  Demonstrated interest in learning. Expect positive outcomes  Education material provided: ADA - How to Thrive: A Guide for Your Journey with Diabetes, Meal plan card, and My Plate  If problems or questions, patient to contact team via:  Phone and Email  Future DSME appointment: PRN

## 2023-09-07 ENCOUNTER — Encounter (HOSPITAL_COMMUNITY)
Admission: RE | Admit: 2023-09-07 | Discharge: 2023-09-07 | Disposition: A | Discharge status: Home / Self Care | Source: Ambulatory Visit | Attending: Cardiology | Admitting: Cardiology

## 2023-09-07 DIAGNOSIS — Z951 Presence of aortocoronary bypass graft: Secondary | ICD-10-CM | POA: Insufficient documentation

## 2023-09-07 DIAGNOSIS — Z48812 Encounter for surgical aftercare following surgery on the circulatory system: Secondary | ICD-10-CM | POA: Insufficient documentation

## 2023-09-07 NOTE — Progress Notes (Signed)
 Daily Session Note  Patient Details  Name: Paul Ryan MRN: [id number] Date of Birth: 05-Jan-1946 Referring Provider:   Flowsheet Row INTENSIVE CARDIAC REHAB ORIENT from 08/25/2023 in [facility]  Referring Provider Paul Parchment, MD    Encounter Date: 09/07/2023  Check In:  Session Check In - 09/07/23 1503       Check-In   Supervising physician immediately available to respond to emergencies [facility] MD immediately available    Physician(s) Paul Braver, NP    Location [facility]    Staff Present Paul Parkins, MS, ACSM-CEP, CCRP, Exercise Physiologist;Paul Elnor, MS, Exercise Physiologist;Paul Valere, MS, ACSM-CEP, Exercise Physiologist;Paul Ryan BS, ACSM-CEP, Exercise Physiologist;Paul Fayette, MS, Exercise Physiologist;Paul Krage, RN, BSN    Virtual Visit No    Medication changes reported     No    Fall or balance concerns reported    No    Tobacco Cessation No Change    Warm-up and Cool-down Performed as group-led instruction    Resistance Training Performed Yes    VAD Patient? No    PAD/SET Patient? No      Pain Assessment   Currently in Pain? No/denies    Pain Score 0-No pain    Multiple Pain Sites No          Capillary Blood Glucose: No results found. However, due to the size of the patient record, not all encounters were searched. Please check Results Review for a complete set of results.   Exercise Prescription Changes - 09/07/23 1623       Response to Exercise   Blood Pressure (Admit) 104/60    Blood Pressure (Exercise) 120/68    Blood Pressure (Exit) 88/64    Heart Rate (Admit) 64 bpm    Heart Rate (Exercise) 116 bpm    Heart Rate (Exit) 73 bpm    Rating of Perceived Exertion (Exercise) 8.5    Perceived Dyspnea (Exercise) 0    Symptoms low BP at discharge, asymptomatic    Comments Pt frist day in the[facility] program    Duration Progress to 30 minutes of  aerobic without  signs/symptoms of physical distress    Intensity THRR unchanged      Progression   Progression Continue to progress workloads to maintain intensity without signs/symptoms of physical distress.    Average METs 4      Resistance Training   Training Prescription Yes    Weight 4 lbs    Reps 10-15    Time 10 Minutes      Bike   Level 2    Watts 38    Minutes 15    METs 3.6      Recumbant Elliptical   Level 2    RPM 68    Watts 92    Minutes 15    METs 4.4          Social History   Tobacco Use  Smoking Status Former   Current packs/day: 0.00   Average packs/day: 1 pack/day for 20.0 years (20.0 ttl pk-yrs)   Types: Cigarettes   Start date: 03/03/1960   Quit date: 03/03/1980   Years since quitting: 43.5  Smokeless Tobacco Never    Goals Met:  Exercise tolerated well No report of concerns or symptoms today Strength training completed today  Goals Unmet:  Not Applicable  Comments: Pt started cardiac rehab today.  Pt tolerated light exercise without difficulty. VSS, telemetry-Sinus rhythm, asymptomatic.  Medication  list reconciled. Pt denies barriers to medicaiton compliance.  PSYCHOSOCIAL ASSESSMENT:  PHQ-0. Pt exhibits positive coping skills, hopeful outlook with supportive family. No psychosocial needs identified at this time, no psychosocial interventions necessary.    Pt enjoys sculptiong, reading,  and volunteering .   Pt oriented to exercise equipment and routine.    Understanding verbalized.Paul Elpidio Quan RN BSN    Dr. Wilbert Ryan is Medical Director for Cardiac Rehab at [facility].

## 2023-09-09 ENCOUNTER — Encounter (HOSPITAL_COMMUNITY): Admission: RE | Admit: 2023-09-09 | Source: Ambulatory Visit

## 2023-09-11 ENCOUNTER — Ambulatory Visit: Payer: Self-pay | Admitting: Plastic Surgery

## 2023-09-11 ENCOUNTER — Encounter (HOSPITAL_COMMUNITY)
Admission: RE | Admit: 2023-09-11 | Discharge: 2023-09-11 | Disposition: A | Discharge status: Home / Self Care | Source: Ambulatory Visit | Attending: Cardiology | Admitting: Cardiology

## 2023-09-11 ENCOUNTER — Encounter: Payer: Self-pay | Admitting: Oncology

## 2023-09-11 DIAGNOSIS — Z719 Counseling, unspecified: Secondary | ICD-10-CM

## 2023-09-11 DIAGNOSIS — Z951 Presence of aortocoronary bypass graft: Secondary | ICD-10-CM | POA: Diagnosis not present

## 2023-09-11 DIAGNOSIS — Z48812 Encounter for surgical aftercare following surgery on the circulatory system: Secondary | ICD-10-CM | POA: Diagnosis not present

## 2023-09-11 NOTE — Progress Notes (Signed)
 Preoperative Dx: hyperpigmentation of face  Postoperative Dx:  same  Procedure: laser to face   Anesthesia: none  Description of Procedure:  Risks and complications were explained to the patient. Consent was confirmed and signed. Eye protection was placed. Time out was called and all information was confirmed to be correct. The area  area was prepped with alcohol and wiped dry. The Heroic laser was set at 560 nm and 9 J/cm2. The face was lasered. The patient tolerated the procedure well and there were no complications. The patient is to follow up in 4 weeks.

## 2023-09-14 ENCOUNTER — Encounter (HOSPITAL_COMMUNITY)
Admission: RE | Admit: 2023-09-14 | Discharge: 2023-09-14 | Disposition: A | Discharge status: Home / Self Care | Source: Ambulatory Visit | Attending: Cardiology

## 2023-09-14 DIAGNOSIS — Z951 Presence of aortocoronary bypass graft: Secondary | ICD-10-CM | POA: Diagnosis not present

## 2023-09-14 DIAGNOSIS — Z48812 Encounter for surgical aftercare following surgery on the circulatory system: Secondary | ICD-10-CM | POA: Diagnosis not present

## 2023-09-15 NOTE — Progress Notes (Signed)
 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 E11.9, Disp: 100 strip, Rfl: 1   Accu-Chek Softclix Lancets lancets, USE AS DIRECTED TO CHECK SUGAR ONCE DAILY.E11.9, Disp: 100 each, Rfl: 1   acetaminophen  (TYLENOL ) 325 MG tablet, Take 2 tablets (650 mg total) by mouth every 6 (six) hours as needed for mild pain (pain score 1-3)., Disp: , Rfl:    ALPHA LIPOIC ACID PO, Take 1 capsule by mouth daily., Disp: , Rfl:    aspirin  EC 81 MG tablet, Take 1 tablet (81 mg total) by mouth daily. Swallow whole., Disp: , Rfl:    Blood Glucose Monitoring Suppl (ONETOUCH VERIO FLEX SYSTEM) w/Device KIT, Use to check blood sugar once a day. DX E11.9, Disp: 1 kit, Rfl: 0   cetirizine  (ZYRTEC ) 10 MG tablet, Take 10 mg by mouth at bedtime., Disp: , Rfl:    Cholecalciferol (VITAMIN D -3 PO), Take 1 capsule by mouth daily., Disp: , Rfl:    Coenzyme Q10 (COQ10) 100 MG CAPS, Take 1 capsule by mouth daily., Disp: , Rfl:    DULoxetine  (CYMBALTA ) 60 MG capsule, Take 60 mg by mouth in the morning., Disp: , Rfl:    empagliflozin  (JARDIANCE ) 10 MG TABS tablet, Take 1 tablet (10 mg total) by mouth daily before breakfast. (Patient not taking: Reported on 08/25/2023), Disp: 30 tablet, Rfl: 3   Evolocumab  (REPATHA  SURECLICK) 140 MG/ML SOAJ, Inject 140 mg into the skin every 14 (fourteen) days., Disp: 6 mL, Rfl: 3    famotidine  (PEPCID ) 40 MG tablet, Take 40 mg by mouth daily., Disp: , Rfl:    MAGNESIUM  PO, Take 1 capsule by mouth daily., Disp: , Rfl:    metoprolol  tartrate (LOPRESSOR ) 25 MG tablet, Take 0.5 tablets (12.5 mg total) by mouth 2 (two) times daily., Disp: 30 tablet, Rfl: 1   Misc Natural Products (MAGIC MUSHROOM MIX) CAPS, Take 1 capsule by mouth daily. 5-in-1 Mushroom Complex, Disp: , Rfl:    pantoprazole  (PROTONIX ) 40 MG tablet, TAKE 1 TABLET BY MOUTH EVERY DAY, Disp: 90 tablet, Rfl: 1   rosuvastatin  (CRESTOR ) 10 MG tablet, Take 1 tablet (10 mg total) by mouth every evening., Disp: , Rfl:    thyroid  (ARMOUR THYROID ) 60 MG tablet, TAKE ONE TABLET BY MOUTH ON MONDAY, WEDNESDAY, THURSDAY, FRIDAY, AND SUNDAY., Disp: 65 tablet, Rfl: 1   tiZANidine  (ZANAFLEX ) 2 MG tablet, Take 0.5-2 tablets (1-4 mg total) by mouth 2 (two) times daily as needed for muscle spasms., Disp: 30 tablet, Rfl: 1   warfarin (COUMADIN ) 4 MG tablet, Take 0.5-1 tablets (2-4 mg total) by mouth See admin instructions. 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   Hyperglycemia 12/26/2014   Hyperlipidemia    Hypothyroid 06/10/2011   Insomnia 04/24/2016   Left hip pain 12/17/2010   MCI (mild cognitive impairment) 07/22/2016   Nasal lesion 07/22/2016   Peripheral vascular disease ([name])    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   Polymyalgia  ([name]) 03/21/2014   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   Preventative health care 12/17/2010   Shingles 1982   right abdominal wall   Sleep apnea    Staph skin infection 07/22/2016   Thyroid  nodule 11/29/2014   Urinary hesitancy 09/30/2016   Vitamin D  deficiency 10/14/2011    Tobacco Use: Social History   Tobacco Use  Smoking Status Former   Current packs/day: 0.00   Average packs/day: 1 pack/day for 20.0 years (20.0 ttl pk-yrs)   Types: Cigarettes   Start date: 03/03/1960   Quit date: 03/03/1980   Years since quitting: 43.5  Smokeless Tobacco Never    Labs: Review Flowsheet  More data exists      Latest Ref Rng & Units 04/14/2022 08/14/2022 06/22/2023 07/01/2023 07/03/2023  Labs for ITP Cardiac and Pulmonary Rehab  Cholestrol 0 - 200 mg/dL 798  - 77  - -  LDL (calc) 0 - 99 mg/dL 870  - 30  - -  HDL-C >60.99 mg/dL 63.09  - 68.39  - -  Trlycerides 0.0 - 149.0 mg/dL 825.9  - 22.9  - -  Hemoglobin A1c 4.8 - 5.6 % 6.8  6.7  6.8  6.3  -  PH, Arterial 7.35 - 7.45 - - - - 7.336  7.318  7.366  7.351  7.365  7.341  7.323   PCO2 arterial 32 - 48 mmHg - - - - 40.5  45.0  36.9  40.5  39.2  43.1  40.1   Bicarbonate 20.0 - 28.0 mmol/L - - - - 21.7  23.1  21.3  22.4  22.4  23.5  23.3  20.8   TCO2 22 - 32 mmol/L - - - - 23  25  22  24  25  24  24  25  25  22  22  24    Acid-base deficit 0.0 - 2.0 mmol/L - - - - 4.0  3.0  4.0  3.0  3.0  3.0  2.0  5.0   O2 Saturation % - - - - 98  98  99  100  100  89  100  100     Details       Multiple values from one day are sorted in reverse-chronological order         Capillary Blood Glucose: Lab Results  Component Value Date   GLUCAP 123 (H) 07/08/2023   GLUCAP 108 (H) 07/08/2023   GLUCAP 117 (H) 07/07/2023   GLUCAP 152 (H) 07/07/2023   GLUCAP 104 (H) 07/07/2023     Exercise Target Goals: Exercise Program Goal: Individual exercise prescription set using results from initial 6 min walk test and THRR while  considering  patient's activity barriers and  safety.   Exercise Prescription Goal: Initial exercise prescription builds to 30-45 minutes a day of aerobic activity, 2-3 days per week.  Home exercise guidelines will be given to patient during program as part of exercise prescription that the participant will acknowledge.  Activity Barriers & Risk Stratification:  Activity Barriers & Cardiac Risk Stratification - 08/25/23 0825       Activity Barriers & Cardiac Risk Stratification   Activity Barriers Arthritis;Joint Problems;Neck/Spine Problems;Back Problems;Balance Concerns    Cardiac Risk Stratification High          6 Minute Walk:  6 Minute Walk     Row Name 08/25/23 0950         6 Minute Walk   Phase Initial     Distance 1912 feet     Walk Time 6 minutes     # of Rest Breaks 0     MPH 3.62     METS 3.7     RPE 11     Perceived Dyspnea  0     VO2 Peak 12.9     Symptoms No     Resting HR 60 bpm     Resting BP 102/64     Resting Oxygen Saturation  100 %     Exercise Oxygen Saturation  during 6 min walk 97 %     Max Ex. HR 98 bpm     Max Ex. BP 116/70     2 Minute Post BP 108/70        Oxygen Initial Assessment:   Oxygen Re-Evaluation:   Oxygen Discharge (Final Oxygen Re-Evaluation):   Initial Exercise Prescription:  Initial Exercise Prescription - 08/25/23 0800       Date of Initial Exercise RX and Referring Provider   Date 08/25/23    Referring Provider [name], MD    Expected Discharge Date 11/18/23      Bike   Level 2    Watts 40    Minutes 15    METs 3.7      Recumbant Elliptical   Level 2    RPM 60    Watts 75    Minutes 15    METs 3.7      Prescription Details   Frequency (times per week) 3    Duration Progress to 30 minutes of continuous aerobic without signs/symptoms of physical distress      Intensity   THRR 40-80% of Max Heartrate 56-113    Ratings of Perceived Exertion 11-13    Perceived Dyspnea 0-4      Progression    Progression Continue progressive overload as per policy without signs/symptoms or physical distress.      Resistance Training   Training Prescription Yes    Weight 4 lbs    Reps 10-15          Perform Capillary Blood Glucose checks as needed.  Exercise Prescription Changes:   Exercise Prescription Changes     Row Name 09/07/23 1623             Response to Exercise   Blood Pressure (Admit) 104/60       Blood Pressure (Exercise) 120/68       Blood Pressure (Exit) 88/64       Heart Rate (Admit) 64 bpm       Heart Rate (Exercise) 116 bpm       Heart Rate (Exit) 73 bpm       Rating of Perceived Exertion (Exercise) 8.5  Perceived Dyspnea (Exercise) 0       Symptoms low BP at discharge, asymptomatic       Comments Pt frist day in the[facility] ICR program       Duration Progress to 30 minutes of  aerobic without signs/symptoms of physical distress       Intensity THRR unchanged         Progression   Progression Continue to progress workloads to maintain intensity without signs/symptoms of physical distress.       Average METs 4         Resistance Training   Training Prescription Yes       Weight 4 lbs       Reps 10-15       Time 10 Minutes         Bike   Level 2       Watts 38       Minutes 15       METs 3.6         Recumbant Elliptical   Level 2       RPM 68       Watts 92       Minutes 15       METs 4.4          Exercise Comments:   Exercise Comments     Row Name 09/07/23 1628           Exercise Comments Pt first day in the [facility]in ICR program. Pt tolerated exercisew ell with an average MET level of 4. Pt is off to a good start and is learning his THRR, RPE and ExRx          Exercise Goals and Review:   Exercise Goals     Row Name 08/25/23 0809             Exercise Goals   Increase Physical Activity Yes       Intervention Develop an individualized exercise prescription for aerobic and resistive training based on initial evaluation  findings, risk stratification, comorbidities and participant's personal goals.;Provide advice, education, support and counseling about physical activity/exercise needs.       Expected Outcomes Short Term: Attend rehab on a regular basis to increase amount of physical activity.;Long Term: Exercising regularly at least 3-5 days a week.;Long Term: Add in home exercise to make exercise part of routine and to increase amount of physical activity.       Increase Strength and Stamina Yes       Intervention Provide advice, education, support and counseling about physical activity/exercise needs.;Develop an individualized exercise prescription for aerobic and resistive training based on initial evaluation findings, risk stratification, comorbidities and participant's personal goals.       Expected Outcomes Short Term: Increase workloads from initial exercise prescription for resistance, speed, and METs.;Short Term: Perform resistance training exercises routinely during rehab and add in resistance training at home;Long Term: Improve cardiorespiratory fitness, muscular endurance and strength as measured by increased METs and functional capacity ( )       Able to understand and use rate of perceived exertion (RPE) scale Yes       Intervention Provide education and explanation on how to use RPE scale       Expected Outcomes Short Term: Able to use RPE daily in rehab to express subjective intensity level;Long Term:  Able to use RPE to guide intensity level when exercising independently       Knowledge and understanding of Target  Heart Rate Range (THRR) Yes       Intervention Provide education and explanation of THRR including how the numbers were predicted and where they are located for reference       Expected Outcomes Short Term: Able to state/look up THRR;Long Term: Able to use THRR to govern intensity when exercising independently;Short Term: Able to use daily as guideline for intensity in rehab        Understanding of Exercise Prescription Yes       Intervention Provide education, explanation, and written materials on patient's individual exercise prescription       Expected Outcomes Short Term: Able to explain program exercise prescription;Long Term: Able to explain home exercise prescription to exercise independently          Exercise Goals Re-Evaluation :  Exercise Goals Re-Evaluation     Row Name 09/07/23 1627             Exercise Goal Re-Evaluation   Exercise Goals Review Increase Physical Activity;Understanding of Exercise Prescription;Increase Strength and Stamina;Knowledge and understanding of Target Heart Rate Range (THRR);Able to understand and use rate of perceived exertion (RPE) scale       Comments Pt first day in the [facility] ICR program. Pt tolerated exercisew ell with an average MET level of 4. Pt is off to a good start and is learning his THRR, RPE and ExRx       Expected Outcomes Will continue to monitor pt and progress workloads as tolerated without sign or symptom          Discharge Exercise Prescription (Final Exercise Prescription Changes):  Exercise Prescription Changes - 09/07/23 1623       Response to Exercise   Blood Pressure (Admit) 104/60    Blood Pressure (Exercise) 120/68    Blood Pressure (Exit) 88/64    Heart Rate (Admit) 64 bpm    Heart Rate (Exercise) 116 bpm    Heart Rate (Exit) 73 bpm    Rating of Perceived Exertion (Exercise) 8.5    Perceived Dyspnea (Exercise) 0    Symptoms low BP at discharge, asymptomatic    Comments Pt frist day in theP[facility] ICR program    Duration Progress to 30 minutes of  aerobic without signs/symptoms of physical distress    Intensity THRR unchanged      Progression   Progression Continue to progress workloads to maintain intensity without signs/symptoms of physical distress.    Average METs 4      Resistance Training   Training Prescription Yes    Weight 4 lbs    Reps 10-15    Time 10 Minutes       Bike   Level 2    Watts 38    Minutes 15    METs 3.6      Recumbant Elliptical   Level 2    RPM 68    Watts 92    Minutes 15    METs 4.4          Nutrition:  Target Goals: Understanding of nutrition guidelines, daily intake of sodium 1500mg , cholesterol 200mg , calories 30% from fat and 7% or less from saturated fats, daily to have 5 or more servings of fruits and vegetables.  Biometrics:   Post Biometrics - 08/25/23 0814        Post  Biometrics   Waist Circumference 37 inches    Hip Circumference 39 inches    Waist to Hip Ratio 0.95 %    Triceps Skinfold 10 mm    %  Body Fat 23.1 %    Grip Strength 37 kg    Flexibility 13.3 in    Single Leg Stand 12.87 seconds          Nutrition Therapy Plan and Nutrition Goals:  Nutrition Therapy & Goals - 09/07/23 1550       Nutrition Therapy   Diet Heart Healthy Diet    Drug/Food Interactions Statins/Certain Fruits;Coumadin /Vit K      Personal Nutrition Goals   Nutrition Goal Patient to identify strategies for reducing cardiovascular risk by attending the [facility] education and nutrition series weekly.    Personal Goal #2 Patient to improve diet quality by using the plate method as a guide for meal planning to include lean protein/plant protein, fruits, vegetables, whole grains, nonfat dairy as part of a well-balanced diet.    Comments Patient has medical history of CAD, CABGx3, Protein S deficency (treated with warfarin), preDM, hyperlipidemia. A1c is in a prediabetic range; will consider starting jardiance . LDL is at goal (repatha , crestor ). Patient will benefit from participation in intensive cardiac rehab for nutrition education, exercise, and lifestyle modification      Intervention Plan   Intervention Prescribe, educate and counsel regarding individualized specific dietary modifications aiming towards targeted core components such as weight, hypertension, lipid management, diabetes, heart failure and other  comorbidities.;Nutrition handout(s) given to patient.    Expected Outcomes Short Term Goal: Understand basic principles of dietary content, such as calories, fat, sodium, cholesterol and nutrients.;Long Term Goal: Adherence to prescribed nutrition plan.          Nutrition Assessments:  MEDIFICTS Score Key: >=70 Need to make dietary changes  40-70 Heart Healthy Diet <= 40 Therapeutic Level Cholesterol Diet    Picture Your Plate Scores: <59 Unhealthy dietary pattern with much room for improvement. 41-50 Dietary pattern unlikely to meet recommendations for good health and room for improvement. 51-60 More healthful dietary pattern, with some room for improvement.  >60 Healthy dietary pattern, although there may be some specific behaviors that could be improved.    Nutrition Goals Re-Evaluation:  Nutrition Goals Re-Evaluation     Row Name 09/07/23 1550             Goals   Current Weight 167 lb 1.7 oz (75.8 kg)       Comment HDL 3`, LDL 30, A1c 6.3       Expected Outcome Patient has medical history of CAD, CABGx3, Protein S deficency (treated with warfarin), preDM, hyperlipidemia. A1c is in a prediabetic range; will consider starting jardiance . LDL is at goal (repatha , crestor ). Patient will benefit from participation in intensive cardiac rehab for nutrition education, exercise, and lifestyle modification          Nutrition Goals Re-Evaluation:  Nutrition Goals Re-Evaluation     Row Name 09/07/23 1550             Goals   Current Weight 167 lb 1.7 oz (75.8 kg)       Comment HDL 3`, LDL 30, A1c 6.3       Expected Outcome Patient has medical history of CAD, CABGx3, Protein S deficency (treated with warfarin), preDM, hyperlipidemia. A1c is in a prediabetic range; will consider starting jardiance . LDL is at goal (repatha , crestor ). Patient will benefit from participation in intensive cardiac rehab for nutrition education, exercise, and lifestyle modification           Nutrition Goals Discharge (Final Nutrition Goals Re-Evaluation):  Nutrition Goals Re-Evaluation - 09/07/23 1550  Goals   Current Weight 167 lb 1.7 oz (75.8 kg)    Comment HDL 3`, LDL 30, A1c 6.3    Expected Outcome Patient has medical history of CAD, CABGx3, Protein S deficency (treated with warfarin), preDM, hyperlipidemia. A1c is in a prediabetic range; will consider starting jardiance . LDL is at goal (repatha , crestor ). Patient will benefit from participation in intensive cardiac rehab for nutrition education, exercise, and lifestyle modification          Psychosocial: Target Goals: Acknowledge presence or absence of significant depression and/or stress, maximize coping skills, provide positive support system. Participant is able to verbalize types and ability to use techniques and skills needed for reducing stress and depression.  Initial Review & Psychosocial Screening:  Initial Psych Review & Screening - 08/25/23 0811       Initial Review   Current issues with None Identified   Pt is on cymbalta      Family Dynamics   Good Support System? Yes   Pt has spouse and daughter for support     Barriers   Psychosocial barriers to participate in program There are no identifiable barriers or psychosocial needs.      Screening Interventions   Interventions Encouraged to exercise          Quality of Life Scores:  Quality of Life - 08/25/23 0948       Quality of Life   Select Quality of Life      Quality of Life Scores   Health/Function Pre 29.2 %    Socioeconomic Pre 30 %    Psych/Spiritual Pre 28.93 %    Family Pre 30 %    GLOBAL Pre 29.43 %         Scores of 19 and below usually indicate a poorer quality of life in these areas.  A difference of  2-3 points is a clinically meaningful difference.  A difference of 2-3 points in the total score of the Quality of Life Index has been associated with significant improvement in overall quality of life, self-image,  physical symptoms, and general health in studies assessing change in quality of life.  PHQ-9: Review Flowsheet  More data exists      09/02/2023 08/25/2023 07/10/2023 03/03/2023 04/15/2022  Depression screen PHQ 2/9  Decreased Interest 0 0 0 0 0  Down, Depressed, Hopeless 0 0 0 0 0  PHQ - 2 Score 0 0 0 0 0  Altered sleeping - 0 - - 0  Tired, decreased energy - 0 - - 0  Change in appetite - 0 - - 0  Feeling bad or failure about yourself  - 0 - - 0  Trouble concentrating - 0 - - 0  Moving slowly or fidgety/restless - 0 - - 0  Suicidal thoughts - - - - 0  PHQ-9 Score - 0 - - 0  Difficult doing work/chores - Not difficult at all - - Not difficult at all   Interpretation of Total Score  Total Score Depression Severity:  1-4 = Minimal depression, 5-9 = Mild depression, 10-14 = Moderate depression, 15-19 = Moderately severe depression, 20-27 = Severe depression   Psychosocial Evaluation and Intervention:   Psychosocial Re-Evaluation:  Psychosocial Re-Evaluation     Row Name 09/07/23 1733             Psychosocial Re-Evaluation   Current issues with None Identified       Interventions Encouraged to attend Cardiac Rehabilitation for the exercise  Continue Psychosocial Services  No Follow up required          Psychosocial Discharge (Final Psychosocial Re-Evaluation):  Psychosocial Re-Evaluation - 09/07/23 1733       Psychosocial Re-Evaluation   Current issues with None Identified    Interventions Encouraged to attend Cardiac Rehabilitation for the exercise    Continue Psychosocial Services  No Follow up required          Vocational Rehabilitation: Provide vocational rehab assistance to qualifying candidates.   Vocational Rehab Evaluation & Intervention:  Vocational Rehab - 08/25/23 9188       Initial Vocational Rehab Evaluation & Intervention   Assessment shows need for Vocational Rehabilitation No   Pt is retired         Education: Education Goals:  Education classes will be provided on a weekly basis, covering required topics. Participant will state understanding/return demonstration of topics presented.    Education     Row Name 09/07/23 1600     Education   Cardiac Education Topics [facility]   Geographical information systems officer Psychosocial   Psychosocial Workshop Healthy Sleep for a Healthy Heart   Instruction Review Code 1- Verbalizes Understanding   Class Start Time 1400   Class Stop Time 1440   Class Time Calculation (min) 40 min    Row Name 09/11/23 1400     Education   Cardiac Education Topics [facility]   Nurse, children's Exercise Physiologist   Select Psychosocial   Psychosocial How Our Thoughts Can Heal Our Hearts   Instruction Review Code 1- Verbalizes Understanding   Class Start Time 1400   Class Stop Time 1440   Class Time Calculation (min) 40 min    Row Name 09/14/23 1400     Education   Cardiac Education Topics [facility]   Hospital doctor Education   General Education Hypertension and Heart Disease   Instruction Review Code 1- Verbalizes Understanding   Class Start Time 1403   Class Stop Time 1445   Class Time Calculation (min) 42 min      Core Videos: Exercise    Move It!  Clinical staff conducted group or individual video education with verbal and written material and guidebook.  Patient learns the recommended [facility] exercise program. Exercise with the goal of living a long, healthy life. Some of the health benefits of exercise include controlled diabetes, healthier blood pressure levels, improved cholesterol levels, improved heart and lung capacity, improved sleep, and better body composition. Everyone should speak with their doctor before starting or changing an exercise routine.  Biomechanical Limitations Clinical staff conducted group or individual  video education with verbal and written material and guidebook.  Patient learns how biomechanical limitations can impact exercise and how we can mitigate and possibly overcome limitations to have an impactful and balanced exercise routine.  Body Composition Clinical staff conducted group or individual video education with verbal and written material and guidebook.  Patient learns that body composition (ratio of muscle mass to fat mass) is a key component to assessing overall fitness, rather than body weight alone. Increased fat mass, especially visceral belly fat, can put us  at increased risk for metabolic syndrome, type 2 diabetes, heart disease, and even death. It is recommended to combine diet and exercise (cardiovascular and resistance training) to improve  your body composition. Seek guidance from your physician and exercise physiologist before implementing an exercise routine.  Exercise Action Plan Clinical staff conducted group or individual video education with verbal and written material and guidebook.  Patient learns the recommended strategies to achieve and enjoy long-term exercise adherence, including variety, self-motivation, self-efficacy, and positive decision making. Benefits of exercise include fitness, good health, weight management, more energy, better sleep, less stress, and overall well-being.  Medical   Heart Disease Risk Reduction Clinical staff conducted group or individual video education with verbal and written material and guidebook.  Patient learns our heart is our most vital organ as it circulates oxygen, nutrients, white blood cells, and hormones throughout the entire body, and carries waste away. Data supports a plant-based eating plan like the Pritikin Program for its effectiveness in slowing progression of and reversing heart disease. The video provides a number of recommendations to address heart disease.   Metabolic Syndrome and Belly Fat  Clinical staff conducted  group or individual video education with verbal and written material and guidebook.  Patient learns what metabolic syndrome is, how it leads to heart disease, and how one can reverse it and keep it from coming back. You have metabolic syndrome if you have 3 of the following 5 criteria: abdominal obesity, high blood pressure, high triglycerides, low HDL cholesterol, and high blood sugar.  Hypertension and Heart Disease Clinical staff conducted group or individual video education with verbal and written material and guidebook.  Patient learns that high blood pressure, or hypertension, is very common in the [place] . Hypertension is largely due to excessive salt intake, but other important risk factors include being overweight, physical inactivity, drinking too much alcohol, smoking, and not eating enough potassium from fruits and vegetables. High blood pressure is a leading risk factor for heart attack, stroke, congestive heart failure, dementia, kidney failure, and premature death. Long-term effects of excessive salt intake include stiffening of the arteries and thickening of heart muscle and organ damage. Recommendations include ways to reduce hypertension and the risk of heart disease.  Diseases of Our Time - Focusing on Diabetes Clinical staff conducted group or individual video education with verbal and written material and guidebook.  Patient learns why the best way to stop diseases of our time is prevention, through food and other lifestyle changes. Medicine (such as prescription pills and surgeries) is often only a Band-Aid on the problem, not a long-term solution. Most common diseases of our time include obesity, type 2 diabetes, hypertension, heart disease, and cancer. The Pritikin Program is recommended and has been proven to help reduce, reverse, and/or prevent the damaging effects of metabolic syndrome.  Nutrition   Overview of the Pritikin Eating Plan  Clinical staff conducted group or  individual video education with verbal and written material and guidebook.  Patient learns about the Pritikin Eating Plan for disease risk reduction. The Pritikin Eating Plan emphasizes a wide variety of unrefined, minimally-processed carbohydrates, like fruits, vegetables, whole grains, and legumes. Go, Caution, and Stop food choices are explained. Plant-based and lean animal proteins are emphasized. Rationale provided for low sodium intake for blood pressure control, low added sugars for blood sugar stabilization, and low added fats and oils for coronary artery disease risk reduction and weight management.  Calorie Density  Clinical staff conducted group or individual video education with verbal and written material and guidebook.  Patient learns about calorie density and how it impacts the Pritikin Eating Plan. Knowing the characteristics of the food you choose  will help you decide whether those foods will lead to weight gain or weight loss, and whether you want to consume more or less of them. Weight loss is usually a side effect of the Pritikin Eating Plan because of its focus on low calorie-dense foods.  Label Reading  Clinical staff conducted group or individual video education with verbal and written material and guidebook.  Patient learns about the Pritikin recommended label reading guidelines and corresponding recommendations regarding calorie density, added sugars, sodium content, and whole grains.  Dining Out - Part 1  Clinical staff conducted group or individual video education with verbal and written material and guidebook.  Patient learns that restaurant meals can be sabotaging because they can be so high in calories, fat, sodium, and/or sugar. Patient learns recommended strategies on how to positively address this and avoid unhealthy pitfalls.  Facts on Fats  Clinical staff conducted group or individual video education with verbal and written material and guidebook.  Patient learns  that lifestyle modifications can be just as effective, if not more so, as many medications for lowering your risk of heart disease. A Pritikin lifestyle can help to reduce your risk of inflammation and atherosclerosis (cholesterol build-up, or plaque, in the artery walls). Lifestyle interventions such as dietary choices and physical activity address the cause of atherosclerosis. A review of the types of fats and their impact on blood cholesterol levels, along with dietary recommendations to reduce fat intake is also included.  Nutrition Action Plan  Clinical staff conducted group or individual video education with verbal and written material and guidebook.  Patient learns how to incorporate Pritikin recommendations into their lifestyle. Recommendations include planning and keeping personal health goals in mind as an important part of their success.  Healthy Mind-Set    Healthy Minds, Bodies, Hearts  Clinical staff conducted group or individual video education with verbal and written material and guidebook.  Patient learns how to identify when they are stressed. Video will discuss the impact of that stress, as well as the many benefits of stress management. Patient will also be introduced to stress management techniques. The way we think, act, and feel has an impact on our hearts.  How Our Thoughts Can Heal Our Hearts  Clinical staff conducted group or individual video education with verbal and written material and guidebook.  Patient learns that negative thoughts can cause depression and anxiety. This can result in negative lifestyle behavior and serious health problems. Cognitive behavioral therapy is an effective method to help control our thoughts in order to change and improve our emotional outlook.  Additional Videos:  Exercise    Improving Performance  Clinical staff conducted group or individual video education with verbal and written material and guidebook.  Patient learns to use a  non-linear approach by alternating intensity levels and lengths of time spent exercising to help burn more calories and lose more body fat. Cardiovascular exercise helps improve heart health, metabolism, hormonal balance, blood sugar control, and recovery from fatigue. Resistance training improves strength, endurance, balance, coordination, reaction time, metabolism, and muscle mass. Flexibility exercise improves circulation, posture, and balance. Seek guidance from your physician and exercise physiologist before implementing an exercise routine and learn your capabilities and proper form for all exercise.  Introduction to Yoga  Clinical staff conducted group or individual video education with verbal and written material and guidebook.  Patient learns about yoga, a discipline of the coming together of mind, breath, and body. The benefits of yoga include improved flexibility, improved range  of motion, better posture and core strength, increased lung function, weight loss, and positive self-image. Yoga's heart health benefits include lowered blood pressure, healthier heart rate, decreased cholesterol and triglyceride levels, improved immune function, and reduced stress. Seek guidance from your physician and exercise physiologist before implementing an exercise routine and learn your capabilities and proper form for all exercise.  Medical   Aging: Enhancing Your Quality of Life  Clinical staff conducted group or individual video education with verbal and written material and guidebook.  Patient learns key strategies and recommendations to stay in good physical health and enhance quality of life, such as prevention strategies, having an advocate, securing a Health Care Proxy and Power of Attorney, and keeping a list of medications and system for tracking them. It also discusses how to avoid risk for bone loss.  Biology of Weight Control  Clinical staff conducted group or individual video education with  verbal and written material and guidebook.  Patient learns that weight gain occurs because we consume more calories than we burn (eating more, moving less). Even if your body weight is normal, you may have higher ratios of fat compared to muscle mass. Too much body fat puts you at increased risk for cardiovascular disease, heart attack, stroke, type 2 diabetes, and obesity-related cancers. In addition to exercise, following the Pritikin Eating Plan can help reduce your risk.  Decoding Lab Results  Clinical staff conducted group or individual video education with verbal and written material and guidebook.  Patient learns that lab test reflects one measurement whose values change over time and are influenced by many factors, including medication, stress, sleep, exercise, food, hydration, pre-existing medical conditions, and more. It is recommended to use the knowledge from this video to become more involved with your lab results and evaluate your numbers to speak with your doctor.   Diseases of Our Time - Overview  Clinical staff conducted group or individual video education with verbal and written material and guidebook.  Patient learns that according to the CDC, 50% to 70% of chronic diseases (such as obesity, type 2 diabetes, elevated lipids, hypertension, and heart disease) are avoidable through lifestyle improvements including healthier food choices, listening to satiety cues, and increased physical activity.  Sleep Disorders Clinical staff conducted group or individual video education with verbal and written material and guidebook.  Patient learns how good quality and duration of sleep are important to overall health and well-being. Patient also learns about sleep disorders and how they impact health along with recommendations to address them, including discussing with a physician.  Nutrition  Dining Out - Part 2 Clinical staff conducted group or individual video education with verbal and  written material and guidebook.  Patient learns how to plan ahead and communicate in order to maximize their dining experience in a healthy and nutritious manner. Included are recommended food choices based on the type of restaurant the patient is visiting.   Fueling a Banker conducted group or individual video education with verbal and written material and guidebook.  There is a strong connection between our food choices and our health. Diseases like obesity and type 2 diabetes are very prevalent and are in large-part due to lifestyle choices. The Pritikin Eating Plan provides plenty of food and hunger-curbing satisfaction. It is easy to follow, affordable, and helps reduce health risks.  Menu Workshop  Clinical staff conducted group or individual video education with verbal and written material and guidebook.  Patient learns that restaurant meals can  sabotage health goals because they are often packed with calories, fat, sodium, and sugar. Recommendations include strategies to plan ahead and to communicate with the manager, chef, or server to help order a healthier meal.  Planning Your Eating Strategy  Clinical staff conducted group or individual video education with verbal and written material and guidebook.  Patient learns about the Pritikin Eating Plan and its benefit of reducing the risk of disease. The Pritikin Eating Plan does not focus on calories. Instead, it emphasizes high-quality, nutrient-rich foods. By knowing the characteristics of the foods, we choose, we can determine their calorie density and make informed decisions.  Targeting Your Nutrition Priorities  Clinical staff conducted group or individual video education with verbal and written material and guidebook.  Patient learns that lifestyle habits have a tremendous impact on disease risk and progression. This video provides eating and physical activity recommendations based on your personal health goals,  such as reducing LDL cholesterol, losing weight, preventing or controlling type 2 diabetes, and reducing high blood pressure.  Vitamins and Minerals  Clinical staff conducted group or individual video education with verbal and written material and guidebook.  Patient learns different ways to obtain key vitamins and minerals, including through a recommended healthy diet. It is important to discuss all supplements you take with your doctor.   Healthy Mind-Set    Smoking Cessation  Clinical staff conducted group or individual video education with verbal and written material and guidebook.  Patient learns that cigarette smoking and tobacco addiction pose a serious health risk which affects millions of people. Stopping smoking will significantly reduce the risk of heart disease, lung disease, and many forms of cancer. Recommended strategies for quitting are covered, including working with your doctor to develop a successful plan.  Culinary   Becoming a Set designer conducted group or individual video education with verbal and written material and guidebook.  Patient learns that cooking at home can be healthy, cost-effective, quick, and puts them in control. Keys to cooking healthy recipes will include looking at your recipe, assessing your equipment needs, planning ahead, making it simple, choosing cost-effective seasonal ingredients, and limiting the use of added fats, salts, and sugars.  Cooking - Breakfast and Snacks  Clinical staff conducted group or individual video education with verbal and written material and guidebook.  Patient learns how important breakfast is to satiety and nutrition through the entire day. Recommendations include key foods to eat during breakfast to help stabilize blood sugar levels and to prevent overeating at meals later in the day. Planning ahead is also a key component.  Cooking - Educational psychologist conducted group or individual video  education with verbal and written material and guidebook.  Patient learns eating strategies to improve overall health, including an approach to cook more at home. Recommendations include thinking of animal protein as a side on your plate rather than center stage and focusing instead on lower calorie dense options like vegetables, fruits, whole grains, and plant-based proteins, such as beans. Making sauces in large quantities to freeze for later and leaving the skin on your vegetables are also recommended to maximize your experience.  Cooking - Healthy Salads and Dressing Clinical staff conducted group or individual video education with verbal and written material and guidebook.  Patient learns that vegetables, fruits, whole grains, and legumes are the foundations of the Pritikin Eating Plan. Recommendations include how to incorporate each of these in flavorful and healthy salads, and how to create  homemade salad dressings. Proper handling of ingredients is also covered. Cooking - Soups and [employer] - Soups and Desserts Clinical staff conducted group or individual video education with verbal and written material and guidebook.  Patient learns that Pritikin soups and desserts make for easy, nutritious, and delicious snacks and meal components that are low in sodium, fat, sugar, and calorie density, while high in vitamins, minerals, and filling fiber. Recommendations include simple and healthy ideas for soups and desserts.   Overview     The Pritikin Solution Program Overview Clinical staff conducted group or individual video education with verbal and written material and guidebook.  Patient learns that the results of the Pritikin Program have been documented in more than 100 articles published in peer-reviewed journals, and the benefits include reducing risk factors for (and, in some cases, even reversing) high cholesterol, high blood pressure, type 2 diabetes, obesity, and more! An overview of  the three key pillars of the Pritikin Program will be covered: eating well, doing regular exercise, and having a healthy mind-set.  WORKSHOPS  Exercise: Exercise Basics: Building Your Action Plan Clinical staff led group instruction and group discussion with PowerPoint presentation and patient guidebook. To enhance the learning environment the use of posters, models and videos may be added. At the conclusion of this workshop, patients will comprehend the difference between physical activity and exercise, as well as the benefits of incorporating both, into their routine. Patients will understand the FITT (Frequency, Intensity, Time, and Type) principle and how to use it to build an exercise action plan. In addition, safety concerns and other considerations for exercise and cardiac rehab will be addressed by the presenter. The purpose of this lesson is to promote a comprehensive and effective weekly exercise routine in order to improve patients' overall level of fitness.   Managing Heart Disease: Your Path to a Healthier Heart Clinical staff led group instruction and group discussion with PowerPoint presentation and patient guidebook. To enhance the learning environment the use of posters, models and videos may be added.At the conclusion of this workshop, patients will understand the anatomy and physiology of the heart. Additionally, they will understand how Pritikin's three pillars impact the risk factors, the progression, and the management of heart disease.  The purpose of this lesson is to provide a high-level overview of the heart, heart disease, and how the Pritikin lifestyle positively impacts risk factors.  Exercise Biomechanics Clinical staff led group instruction and group discussion with PowerPoint presentation and patient guidebook. To enhance the learning environment the use of posters, models and videos may be added. Patients will learn how the structural parts of their bodies  function and how these functions impact their daily activities, movement, and exercise. Patients will learn how to promote a neutral spine, learn how to manage pain, and identify ways to improve their physical movement in order to promote healthy living. The purpose of this lesson is to expose patients to common physical limitations that impact physical activity. Participants will learn practical ways to adapt and manage aches and pains, and to minimize their effect on regular exercise. Patients will learn how to maintain good posture while sitting, walking, and lifting.  Balance Training and Fall Prevention  Clinical staff led group instruction and group discussion with PowerPoint presentation and patient guidebook. To enhance the learning environment the use of posters, models and videos may be added. At the conclusion of this workshop, patients will understand the importance of their sensorimotor skills (vision, proprioception, and the  vestibular system) in maintaining their ability to balance as they age. Patients will apply a variety of balancing exercises that are appropriate for their current level of function. Patients will understand the common causes for poor balance, possible solutions to these problems, and ways to modify their physical environment in order to minimize their fall risk. The purpose of this lesson is to teach patients about the importance of maintaining balance as they age and ways to minimize their risk of falling.  WORKSHOPS   Nutrition:  Fueling a Ship broker led group instruction and group discussion with PowerPoint presentation and patient guidebook. To enhance the learning environment the use of posters, models and videos may be added. Patients will review the foundational principles of the Pritikin Eating Plan and understand what constitutes a serving size in each of the food groups. Patients will also learn Pritikin-friendly foods that are better  choices when away from home and review make-ahead meal and snack options. Calorie density will be reviewed and applied to three nutrition priorities: weight maintenance, weight loss, and weight gain. The purpose of this lesson is to reinforce (in a group setting) the key concepts around what patients are recommended to eat and how to apply these guidelines when away from home by planning and selecting Pritikin-friendly options. Patients will understand how calorie density may be adjusted for different weight management goals.  Mindful Eating  Clinical staff led group instruction and group discussion with PowerPoint presentation and patient guidebook. To enhance the learning environment the use of posters, models and videos may be added. Patients will briefly review the concepts of the Pritikin Eating Plan and the importance of low-calorie dense foods. The concept of mindful eating will be introduced as well as the importance of paying attention to internal hunger signals. Triggers for non-hunger eating and techniques for dealing with triggers will be explored. The purpose of this lesson is to provide patients with the opportunity to review the basic principles of the Pritikin Eating Plan, discuss the value of eating mindfully and how to measure internal cues of hunger and fullness using the Hunger Scale. Patients will also discuss reasons for non-hunger eating and learn strategies to use for controlling emotional eating.  Targeting Your Nutrition Priorities Clinical staff led group instruction and group discussion with PowerPoint presentation and patient guidebook. To enhance the learning environment the use of posters, models and videos may be added. Patients will learn how to determine their genetic susceptibility to disease by reviewing their family history. Patients will gain insight into the importance of diet as part of an overall healthy lifestyle in mitigating the impact of genetics and other  environmental insults. The purpose of this lesson is to provide patients with the opportunity to assess their personal nutrition priorities by looking at their family history, their own health history and current risk factors. Patients will also be able to discuss ways of prioritizing and modifying the Pritikin Eating Plan for their highest risk areas  Menu  Clinical staff led group instruction and group discussion with PowerPoint presentation and patient guidebook. To enhance the learning environment the use of posters, models and videos may be added. Using menus brought in from [employer], or printed from [employer], patients will apply the Pritikin dining out guidelines that were presented in the [employer] video. Patients will also be able to practice these guidelines in a variety of provided scenarios. The purpose of this lesson is to provide patients with the opportunity to  practice hands-on learning of the [employer] guidelines with actual menus and practice scenarios.  Label Reading Clinical staff led group instruction and group discussion with PowerPoint presentation and patient guidebook. To enhance the learning environment the use of posters, models and videos may be added. Patients will review and discuss the Pritikin label reading guidelines presented in Pritikin's Label Reading Educational series video. Using fool labels brought in from local grocery stores and markets, patients will apply the label reading guidelines and determine if the packaged food meet the Pritikin guidelines. The purpose of this lesson is to provide patients with the opportunity to review, discuss, and practice hands-on learning of the Pritikin Label Reading guidelines with actual packaged food labels. Cooking School  Pritikin's [employer] are designed to teach patients ways to prepare quick, simple, and affordable recipes at home. The importance of nutrition's role in  chronic disease risk reduction is reflected in its emphasis in the overall Pritikin program. By learning how to prepare essential core Pritikin Eating Plan recipes, patients will increase control over what they eat; be able to customize the flavor of foods without the use of added salt, sugar, or fat; and improve the quality of the food they consume. By learning a set of core recipes which are easily assembled, quickly prepared, and affordable, patients are more likely to prepare more healthy foods at home. These workshops focus on convenient breakfasts, simple entres, side dishes, and desserts which can be prepared with minimal effort and are consistent with nutrition recommendations for cardiovascular risk reduction. Cooking [employer] are taught by a Armed forces logistics/support/administrative officer (RD) who has been trained by the [employer]. The chef or RD has a clear understanding of the importance of minimizing - if not completely eliminating - added fat, sugar, and sodium in recipes. Throughout the series of Cooking School Workshop sessions, patients will learn about healthy ingredients and efficient methods of cooking to build confidence in their capability to prepare    Cooking School weekly topics:  Adding Flavor- Sodium-Free  Fast and Healthy Breakfasts  Powerhouse Plant-Based Proteins  Satisfying Salads and Dressings  Simple Sides and Sauces  International Cuisine-Spotlight on the [employer] Zones  Delicious Desserts  Savory Soups  [employer] - Meals in a Astronomer Appetizers and Snacks  Comforting Weekend Breakfasts  One-Pot Wonders   Fast Evening Meals  Landscape architect Your Pritikin Plate  WORKSHOPS   Healthy Mindset (Psychosocial):  Focused Goals, Sustainable Changes Clinical staff led group instruction and group discussion with PowerPoint presentation and patient guidebook. To enhance the learning environment the use of posters, models and videos may  be added. Patients will be able to apply effective goal setting strategies to establish at least one personal goal, and then take consistent, meaningful action toward that goal. They will learn to identify common barriers to achieving personal goals and develop strategies to overcome them. Patients will also gain an understanding of how our mind-set can impact our ability to achieve goals and the importance of cultivating a positive and growth-oriented mind-set. The purpose of this lesson is to provide patients with a deeper understanding of how to set and achieve personal goals, as well as the tools and strategies needed to overcome common obstacles which may arise along the way.  From Head to Heart: The Power of a Healthy Outlook  Clinical staff led group instruction and group discussion with PowerPoint presentation and patient guidebook. To enhance the learning environment the  use of posters, models and videos may be added. Patients will be able to recognize and describe the impact of emotions and mood on physical health. They will discover the importance of self-care and explore self-care practices which may work for them. Patients will also learn how to utilize the 4 C's to cultivate a healthier outlook and better manage stress and challenges. The purpose of this lesson is to demonstrate to patients how a healthy outlook is an essential part of maintaining good health, especially as they continue their cardiac rehab journey.  Healthy Sleep for a Healthy Heart Clinical staff led group instruction and group discussion with PowerPoint presentation and patient guidebook. To enhance the learning environment the use of posters, models and videos may be added. At the conclusion of this workshop, patients will be able to demonstrate knowledge of the importance of sleep to overall health, well-being, and quality of life. They will understand the symptoms of, and treatments for, common sleep disorders. Patients  will also be able to identify daytime and nighttime behaviors which impact sleep, and they will be able to apply these tools to help manage sleep-related challenges. The purpose of this lesson is to provide patients with a general overview of sleep and outline the importance of quality sleep. Patients will learn about a few of the most common sleep disorders. Patients will also be introduced to the concept of "sleep hygiene," and discover ways to self-manage certain sleeping problems through simple daily behavior changes. Finally, the workshop will motivate patients by clarifying the links between quality sleep and their goals of heart-healthy living.   Recognizing and Reducing Stress Clinical staff led group instruction and group discussion with PowerPoint presentation and patient guidebook. To enhance the learning environment the use of posters, models and videos may be added. At the conclusion of this workshop, patients will be able to understand the types of stress reactions, differentiate between acute and chronic stress, and recognize the impact that chronic stress has on their health. They will also be able to apply different coping mechanisms, such as reframing negative self-talk. Patients will have the opportunity to practice a variety of stress management techniques, such as deep abdominal breathing, progressive muscle relaxation, and/or guided imagery.  The purpose of this lesson is to educate patients on the role of stress in their lives and to provide healthy techniques for coping with it.  Learning Barriers/Preferences:  Learning Barriers/Preferences - 08/25/23 0954       Learning Barriers/Preferences   Learning Barriers --          Education Topics:  Knowledge Questionnaire Score:  Knowledge Questionnaire Score - 08/25/23 0845       Knowledge Questionnaire Score   Pre Score 20/24          Core Components/Risk Factors/Patient Goals at Admission:  Personal Goals and Risk  Factors at Admission - 08/25/23 0845       Core Components/Risk Factors/Patient Goals on Admission    Weight Management Weight Maintenance    Diabetes Yes    Intervention Provide education about signs/symptoms and action to take for hypo/hyperglycemia.;Provide education about proper nutrition, including hydration, and aerobic/resistive exercise prescription along with prescribed medications to achieve blood glucose in normal ranges: Fasting glucose 65-99 mg/dL    Expected Outcomes Short Term: Participant verbalizes understanding of the signs/symptoms and immediate care of hyper/hypoglycemia, proper foot care and importance of medication, aerobic/resistive exercise and nutrition plan for blood glucose control.;Long Term: Attainment of HbA1C < 7%.    Hypertension  Yes    Intervention Provide education on lifestyle modifcations including regular physical activity/exercise, weight management, moderate sodium restriction and increased consumption of fresh fruit, vegetables, and low fat dairy, alcohol moderation, and smoking cessation.;Monitor prescription use compliance.    Expected Outcomes Short Term: Continued assessment and intervention until BP is < 140/41mm HG in hypertensive participants. < 130/105mm HG in hypertensive participants with diabetes, heart failure or chronic kidney disease.;Long Term: Maintenance of blood pressure at goal levels.    Lipids Yes    Intervention Provide education and support for participant on nutrition & aerobic/resistive exercise along with prescribed medications to achieve LDL 70mg , HDL >40mg .    Expected Outcomes Short Term: Participant states understanding of desired cholesterol values and is compliant with medications prescribed. Participant is following exercise prescription and nutrition guidelines.;Long Term: Cholesterol controlled with medications as prescribed, with individualized exercise RX and with personalized nutrition plan. Value goals: LDL < 70mg , HDL > 40  mg.          Core Components/Risk Factors/Patient Goals Review:   Goals and Risk Factor Review     Row Name 09/07/23 1734 09/15/23 1653           Core Components/Risk Factors/Patient Goals Review   Personal Goals Review Weight Management/Obesity;Lipids;Hypertension;Diabetes Weight Management/Obesity;Lipids;Hypertension;Diabetes      Review Kostantinos started cardiac rehab on 09/07/23. Paydon did well with exercise. Post exercise BP was  88/64 asymptomatic. patient left before recheck bP. Will continue to monitor. Seon started cardiac rehab on 09/07/23. Jassen did well with exercise. Resting systolic BP's remains in the 90's. Will conitnue to monitor BP's. Asymptomatic.      Expected Outcomes Usman will contiunue to participate in caridac rehab for exercise, nutrtion and lifestyle modifications. Keyshon will contiunue to participate in caridac rehab for exercise, nutrtion and lifestyle modifications.         Core Components/Risk Factors/Patient Goals at Discharge (Final Review):   Goals and Risk Factor Review - 09/15/23 1653       Core Components/Risk Factors/Patient Goals Review   Personal Goals Review Weight Management/Obesity;Lipids;Hypertension;Diabetes    Review Elena started cardiac rehab on 09/07/23. Tito did well with exercise. Resting systolic BP's remains in the 90's. Will conitnue to monitor BP's. Asymptomatic.    Expected Outcomes Dametri will contiunue to participate in caridac rehab for exercise, nutrtion and lifestyle modifications.          ITP Comments:  ITP Comments     Row Name 08/25/23 0810 09/07/23 1732 09/15/23 1652       ITP Comments Wilbert Bihari, MD: Medical Director.  Introduction to the [employer] / Intensive Cardiac Rehab.  Initial orienation packet reviewed witht he patient. 30 Day ITP Review. Garris started cardiac rehab on 09/07/23. Kalonji did well with exercise. 30 Day ITP Review. Darren started cardiac rehab on 09/07/23. Jermanie is off to a  good start with exercise.        Comments: See ITP comments

## 2023-09-16 ENCOUNTER — Encounter (HOSPITAL_COMMUNITY)
Admission: RE | Admit: 2023-09-16 | Discharge: 2023-09-16 | Disposition: A | Discharge status: Home / Self Care | Source: Ambulatory Visit | Attending: Cardiology

## 2023-09-16 DIAGNOSIS — Z951 Presence of aortocoronary bypass graft: Secondary | ICD-10-CM

## 2023-09-16 DIAGNOSIS — Z48812 Encounter for surgical aftercare following surgery on the circulatory system: Secondary | ICD-10-CM | POA: Diagnosis not present

## 2023-09-18 ENCOUNTER — Encounter (HOSPITAL_COMMUNITY)
Admission: RE | Admit: 2023-09-18 | Discharge: 2023-09-18 | Disposition: A | Discharge status: Home / Self Care | Source: Ambulatory Visit | Attending: Cardiology | Admitting: Cardiology

## 2023-09-18 DIAGNOSIS — Z951 Presence of aortocoronary bypass graft: Secondary | ICD-10-CM

## 2023-09-18 DIAGNOSIS — Z48812 Encounter for surgical aftercare following surgery on the circulatory system: Secondary | ICD-10-CM | POA: Diagnosis not present

## 2023-09-21 ENCOUNTER — Encounter (HOSPITAL_COMMUNITY)
Admission: RE | Admit: 2023-09-21 | Discharge: 2023-09-21 | Disposition: A | Discharge status: Home / Self Care | Source: Ambulatory Visit | Attending: Cardiology | Admitting: Cardiology

## 2023-09-21 DIAGNOSIS — Z951 Presence of aortocoronary bypass graft: Secondary | ICD-10-CM

## 2023-09-21 NOTE — Progress Notes (Addendum)
 Entry blood pressure 88/62 sitting standing BP 87/60 after drinking water. Patient asymptomatic. Telemetry rhythm Sinus brady 52. Will consult with onsite provider Rosaline Bane NP. Onsite provider Rosaline Bane NP instructed the  patient to hold his metoprolol  on Cardiac Rehab days. Recheck BP 86/48. Paul Ryan said that he had a piece of toast cheese and fruit before coming to exercise at cardiac rehab. I advised Paul Ryan to exercise today due to low BP. Patient was given some wheat/cheese crackers and apple juice. Repeat exit BP 104/68. Will forward today's vital signs to Dr Casimiro for review. Paul Ryan left cardiac rehab without complaints or symptoms and plans to return to exercise on Wednesday.Will continue to monitor the patient throughout  the program.Paul Ryan Paul Quan RN BSN

## 2023-09-22 DIAGNOSIS — K219 Gastro-esophageal reflux disease without esophagitis: Secondary | ICD-10-CM | POA: Diagnosis not present

## 2023-09-22 DIAGNOSIS — K449 Diaphragmatic hernia without obstruction or gangrene: Secondary | ICD-10-CM | POA: Diagnosis not present

## 2023-09-23 ENCOUNTER — Encounter (HOSPITAL_COMMUNITY)
Admission: RE | Admit: 2023-09-23 | Discharge: 2023-09-23 | Disposition: A | Discharge status: Home / Self Care | Source: Ambulatory Visit | Attending: Cardiology | Admitting: Cardiology

## 2023-09-23 DIAGNOSIS — Z951 Presence of aortocoronary bypass graft: Secondary | ICD-10-CM | POA: Diagnosis not present

## 2023-09-23 DIAGNOSIS — Z48812 Encounter for surgical aftercare following surgery on the circulatory system: Secondary | ICD-10-CM | POA: Diagnosis not present

## 2023-09-23 NOTE — Progress Notes (Signed)
 CARDIAC REHAB PHASE 2  Reviewed home exercise with pt today. Pt is tolerating exercise well. Pt will continue to exercise on his own by walking, doing tai chi and squats for 30-45 minutes per session 1-2 days a week in addition to the 3 days in CRP2. Advised pt on THRR, RPE scale, hydration and temperature/humidity precautions. Reinforced S/S to stop exercise and when to call MD vs 911. Encouraged warm up cool down and stretches with exercise sessions. Pt verbalized understanding, all questions were answered and pt was given a copy to take home.    Alec GORMAN Finder ACSM-CEP 09/23/2023 4:50 PM

## 2023-09-25 ENCOUNTER — Telehealth (HOSPITAL_COMMUNITY): Payer: Self-pay | Admitting: *Deleted

## 2023-09-25 ENCOUNTER — Encounter (HOSPITAL_COMMUNITY)
Admission: RE | Admit: 2023-09-25 | Discharge: 2023-09-25 | Disposition: A | Discharge status: Home / Self Care | Source: Ambulatory Visit | Attending: Cardiology | Admitting: Cardiology

## 2023-09-25 ENCOUNTER — Telehealth: Payer: Self-pay | Admitting: *Deleted

## 2023-09-25 DIAGNOSIS — Z48812 Encounter for surgical aftercare following surgery on the circulatory system: Secondary | ICD-10-CM | POA: Diagnosis not present

## 2023-09-25 DIAGNOSIS — Z951 Presence of aortocoronary bypass graft: Secondary | ICD-10-CM

## 2023-09-25 NOTE — Telephone Encounter (Signed)
 Left message on pt's voicemail for pt to discontinue his Metoprolol  per Dr Jeffrie.  Requested pt call back if any questions or concerns. Medication removed from his med list.  Jeffrie Oneil BROCKS, MD  Cyrus Hadassah ORN, RN; Theotis Sharlet PARAS, RN; Percy Rosaline HERO, NP OK to stop metoprolol . Thanks Oneil Jeffrie, MD       Previous Messages    Cyrus, Hadassah ORN, RN  Registered Nurse Cardiac Rehab   Progress Notes    Addendum   Date of Service: 09/21/2023  1:45 PM    Entry blood pressure 88/62 sitting standing BP 87/60 after drinking water. Patient asymptomatic. Telemetry rhythm Sinus brady 52. Will consult with onsite provider Rosaline Percy NP. Onsite provider Rosaline Percy NP instructed the  patient to hold his metoprolol  on Cardiac Rehab days. Recheck BP 86/48. Paul Ryan said that he had a piece of toast cheese and fruit before coming to exercise at cardiac rehab. I advised [name] to exercise today due to low BP. Patient was given some wheat/cheese crackers and apple juice. Repeat exit BP 104/68. Will forward today's vital signs to Dr Casimiro for review. Paul Ryan left cardiac rehab without complaints or symptoms and plans to return to exercise on Wednesday.Will continue to monitor the patient throughout  the program.[name] Elpidio Cyrus RN BSN     Electronically signed by Cyrus Hadassah ORN, RN at 09/21/2023  3:26 PM Electronically signed by Cyrus Hadassah ORN, RN at 09/21/2023  3:58 PM

## 2023-09-25 NOTE — Telephone Encounter (Signed)
-----   Message from [name] sent at 09/24/2023  3:51 PM EDT ----- OK to stop metoprolol .  Thanks [name], MD ----- Message ----- From: Cyrus Hadassah ORN, RN Sent: 09/21/2023   4:03 PM EDT To: [name], MD; Sharlet JINNY Servant, RN; Mic#  Good afternoon Dr Parchment,  Mr Brunet's systolic BP's were in the 80's this afternoon he did not exercise today. Rosaline told Mr Fricker to hold his AM metoprolol  on exercise days at cardiac rehab on Monday/ Wed and Fridays.  Please let me know if you have any further concerns or recommendations.  Thank you  Sincerely, [facility]  Cardiac Rehab

## 2023-09-25 NOTE — Progress Notes (Signed)
 Patient instructed to stop metoprolol  per Dr Jeffrie.[name] RN BSN

## 2023-09-28 ENCOUNTER — Encounter (HOSPITAL_COMMUNITY)
Admission: RE | Admit: 2023-09-28 | Discharge: 2023-09-28 | Disposition: A | Discharge status: Home / Self Care | Source: Ambulatory Visit | Attending: Cardiology | Admitting: Cardiology

## 2023-09-28 DIAGNOSIS — Z48812 Encounter for surgical aftercare following surgery on the circulatory system: Secondary | ICD-10-CM | POA: Diagnosis not present

## 2023-09-28 DIAGNOSIS — Z951 Presence of aortocoronary bypass graft: Secondary | ICD-10-CM | POA: Diagnosis not present

## 2023-09-29 DIAGNOSIS — S6992XA Unspecified injury of left wrist, hand and finger(s), initial encounter: Secondary | ICD-10-CM | POA: Diagnosis not present

## 2023-09-29 DIAGNOSIS — S60012A Contusion of left thumb without damage to nail, initial encounter: Secondary | ICD-10-CM | POA: Diagnosis not present

## 2023-09-30 ENCOUNTER — Encounter: Payer: Self-pay | Admitting: Diagnostic Neuroimaging

## 2023-09-30 ENCOUNTER — Other Ambulatory Visit (INDEPENDENT_AMBULATORY_CARE_PROVIDER_SITE_OTHER)

## 2023-09-30 ENCOUNTER — Ambulatory Visit: Admitting: Diagnostic Neuroimaging

## 2023-09-30 ENCOUNTER — Encounter (HOSPITAL_COMMUNITY)
Admission: RE | Admit: 2023-09-30 | Discharge: 2023-09-30 | Disposition: A | Discharge status: Home / Self Care | Source: Ambulatory Visit | Attending: Cardiology

## 2023-09-30 ENCOUNTER — Ambulatory Visit: Payer: Self-pay | Admitting: Family Medicine

## 2023-09-30 VITALS — BP 112/76 | HR 87 | Ht 71.0 in | Wt 166.0 lb

## 2023-09-30 DIAGNOSIS — G4752 REM sleep behavior disorder: Secondary | ICD-10-CM

## 2023-09-30 DIAGNOSIS — E1169 Type 2 diabetes mellitus with other specified complication: Secondary | ICD-10-CM | POA: Diagnosis not present

## 2023-09-30 DIAGNOSIS — E039 Hypothyroidism, unspecified: Secondary | ICD-10-CM | POA: Diagnosis not present

## 2023-09-30 DIAGNOSIS — E538 Deficiency of other specified B group vitamins: Secondary | ICD-10-CM

## 2023-09-30 DIAGNOSIS — E785 Hyperlipidemia, unspecified: Secondary | ICD-10-CM | POA: Diagnosis not present

## 2023-09-30 DIAGNOSIS — Z951 Presence of aortocoronary bypass graft: Secondary | ICD-10-CM

## 2023-09-30 DIAGNOSIS — G4733 Obstructive sleep apnea (adult) (pediatric): Secondary | ICD-10-CM

## 2023-09-30 DIAGNOSIS — E559 Vitamin D deficiency, unspecified: Secondary | ICD-10-CM | POA: Diagnosis not present

## 2023-09-30 DIAGNOSIS — Z48812 Encounter for surgical aftercare following surgery on the circulatory system: Secondary | ICD-10-CM | POA: Diagnosis not present

## 2023-09-30 LAB — CBC WITH DIFFERENTIAL/PLATELET
Basophils Absolute: 0 K/uL (ref 0.0–0.1)
Basophils Relative: 0.4 % (ref 0.0–3.0)
Eosinophils Absolute: 0.2 K/uL (ref 0.0–0.7)
Eosinophils Relative: 2.9 % (ref 0.0–5.0)
HCT: 39.6 % (ref 39.0–52.0)
Hemoglobin: 12.9 g/dL — ABNORMAL LOW (ref 13.0–17.0)
Lymphocytes Relative: 24.2 % (ref 12.0–46.0)
Lymphs Abs: 1.6 K/uL (ref 0.7–4.0)
MCHC: 32.5 g/dL (ref 30.0–36.0)
MCV: 87 fl (ref 78.0–100.0)
Monocytes Absolute: 0.6 K/uL (ref 0.1–1.0)
Monocytes Relative: 8.9 % (ref 3.0–12.0)
Neutro Abs: 4.1 K/uL (ref 1.4–7.7)
Neutrophils Relative %: 63.6 % (ref 43.0–77.0)
Platelets: 220 K/uL (ref 150.0–400.0)
RBC: 4.56 Mil/uL (ref 4.22–5.81)
RDW: 14.9 % (ref 11.5–15.5)
WBC: 6.5 K/uL (ref 4.0–10.5)

## 2023-09-30 LAB — COMPREHENSIVE METABOLIC PANEL WITH GFR
ALT: 15 U/L (ref 0–53)
AST: 19 U/L (ref 0–37)
Albumin: 4 g/dL (ref 3.5–5.2)
Alkaline Phosphatase: 70 U/L (ref 39–117)
BUN: 17 mg/dL (ref 6–23)
CO2: 26 meq/L (ref 19–32)
Calcium: 8.7 mg/dL (ref 8.4–10.5)
Chloride: 106 meq/L (ref 96–112)
Creatinine, Ser: 1.09 mg/dL (ref 0.40–1.50)
GFR: 64.74 mL/min (ref >60.00)
Glucose, Bld: 124 mg/dL — ABNORMAL HIGH (ref 70–99)
Potassium: 4.9 meq/L (ref 3.5–5.1)
Sodium: 139 meq/L (ref 135–145)
Total Bilirubin: 0.5 mg/dL (ref 0.2–1.2)
Total Protein: 6.7 g/dL (ref 6.0–8.3)

## 2023-09-30 LAB — LIPID PANEL
Cholesterol: 86 mg/dL (ref 0–200)
HDL: 34.5 mg/dL — ABNORMAL LOW (ref >39.00)
LDL Cholesterol: 27 mg/dL (ref 0–99)
NonHDL: 51.82
Total CHOL/HDL Ratio: 3
Triglycerides: 125 mg/dL (ref 0.0–149.0)
VLDL: 25 mg/dL (ref 0.0–40.0)

## 2023-09-30 LAB — VITAMIN D 25 HYDROXY (VIT D DEFICIENCY, FRACTURES): VITD: 63.66 ng/mL (ref 30.00–100.00)

## 2023-09-30 LAB — MICROALBUMIN / CREATININE URINE RATIO
Creatinine,U: 98.9 mg/dL
Microalb Creat Ratio: UNDETERMINED mg/g (ref 0.0–30.0)
Microalb, Ur: <0.7 mg/dL

## 2023-09-30 LAB — VITAMIN B12: Vitamin B-12: 804 pg/mL (ref 211–911)

## 2023-09-30 LAB — TSH: TSH: 0.78 u[IU]/mL (ref 0.35–5.50)

## 2023-09-30 LAB — HEMOGLOBIN A1C: Hgb A1c MFr Bld: 6.9 % — ABNORMAL HIGH (ref 4.6–6.5)

## 2023-09-30 MED ORDER — DULOXETINE HCL 30 MG PO CPEP: 30.0000 mg | ORAL_CAPSULE | Freq: Every day | ORAL | 4 refills | Status: DC

## 2023-09-30 MED ORDER — DULOXETINE HCL 60 MG PO CPEP: 60.0000 mg | ORAL_CAPSULE | Freq: Every morning | ORAL | 4 refills | Status: AC

## 2023-09-30 NOTE — Patient Instructions (Signed)
 BURNING PAIN IN RIGHT FOOT --> IDIOPATHIC NEUROPATHY (started ~2021; prior workup negative by Dr. Payton) - continue duloxetine  60mg  in AM, 30mg  in PM - consider pregabalin in future if pain worsens  [facility] - try to stay active physically and get some exercise (at least 15-30 minutes per day) - eat a nutritious diet with lean protein, plants / vegetables, whole grains; avoid ultra-processed foods - increase social activities, brain stimulation, games, puzzles, hobbies, crafts, arts, music; try new activities; keep it fun! 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

## 2023-09-30 NOTE — Progress Notes (Signed)
 [facility]  PATIENT: Paul Ryan DOB: 03-11-1945  REFERRING CLINICIAN: Domenica Harlene LABOR, MD HISTORY FROM: patient  REASON FOR VISIT: new consult   HISTORICAL  CHIEF COMPLAINT:  Chief Complaint  Patient presents with   Numbness    Rm 6 alone Pt is well, reports burning in R foot for 2-3 years. symptoms have progressed over time. No other concerns. Cymbalta  does help    HISTORY OF PRESENT ILLNESS:   78 year old male here for evaluation of right foot numbness and pain.  Symptoms started around 2021.  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  Has been on Cymbalta  60 in the morning and 30 at night with mild relief.  Previous workup and treatment by Dr.Tellez.  Now looking for a local neurologist.  Lately patient has noted some problems with acting out dreams, punching and kicking during sleep.  Has history of sleep apnea treated with dental appliance.  He did not tolerate CPAP in the past.  Last sleep study greater than 5 years ago.  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  He has continued to work in this field on projects since retirement.   REVIEW OF SYSTEMS: Full 14 system review of systems performed and negative with exception of: As per HPI.  ALLERGIES: No Known Allergies  HOME MEDICATIONS: Outpatient Medications Prior to Visit  Medication Sig Dispense Refill   ACCU-CHEK GUIDE TEST test strip USE TO CHECK BLOOD SUGAR ONCE DAILY . E11.9 100 strip 1   Accu-Chek Softclix Lancets lancets USE AS DIRECTED TO CHECK SUGAR ONCE DAILY.E11.9 100 each 1   acetaminophen  (TYLENOL ) 325 MG tablet Take 2 tablets (650 mg total) by mouth every 6 (six) hours as needed for mild pain (pain score 1-3).     ALPHA LIPOIC ACID PO Take 1 capsule by mouth daily.     aspirin  EC 81 MG tablet Take 1 tablet (81 mg total) by mouth daily. Swallow whole.     Blood Glucose Monitoring Suppl (ONETOUCH VERIO FLEX  SYSTEM) w/Device KIT Use to check blood sugar once a day. DX E11.9 1 kit 0   cetirizine  (ZYRTEC ) 10 MG tablet Take 10 mg by mouth at bedtime.     Cholecalciferol (VITAMIN D -3 PO) Take 1 capsule by mouth daily.     Coenzyme Q10 (COQ10) 100 MG CAPS Take 1 capsule by mouth daily.     DULoxetine  (CYMBALTA ) 60 MG capsule Take 60 mg by mouth in the morning.     Evolocumab  (REPATHA  SURECLICK) 140 MG/ML SOAJ Inject 140 mg into the skin every 14 (fourteen) days. 6 mL 3   famotidine  (PEPCID ) 40 MG tablet Take 40 mg by mouth daily.     MAGNESIUM  PO Take 1 capsule by mouth daily.     Misc Natural Products (MAGIC MUSHROOM MIX) CAPS Take 1 capsule by mouth daily. 5-in-1 Mushroom Complex     pantoprazole  (PROTONIX ) 40 MG tablet TAKE 1 TABLET BY MOUTH EVERY DAY 90 tablet 1   rosuvastatin  (CRESTOR ) 10 MG tablet Take 1 tablet (10 mg total) by mouth every evening.     thyroid  (ARMOUR THYROID ) 60 MG tablet TAKE ONE TABLET BY MOUTH ON MONDAY, WEDNESDAY, THURSDAY, FRIDAY, AND SUNDAY. 65 tablet 1   tiZANidine  (ZANAFLEX ) 2 MG tablet Take 0.5-2 tablets (1-4 mg total) by mouth 2 (two) times daily as needed for muscle spasms. 30 tablet 1   warfarin (COUMADIN ) 4 MG tablet Take 0.5-1  tablets (2-4 mg total) by mouth See admin instructions. 4 mg on Sun Mon Wed Fri and 2 mg Tues Thurs and Sat 90 tablet 1   zinc gluconate 50 MG tablet Take 50 mg by mouth daily.     empagliflozin  (JARDIANCE ) 10 MG TABS tablet Take 1 tablet (10 mg total) by mouth daily before breakfast. (Patient not taking: Reported on 09/30/2023) 30 tablet 3   No facility-administered medications prior to visit.    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   Hyperglycemia 12/26/2014   Hyperlipidemia    Hypothyroid 06/10/2011   Insomnia 04/24/2016   Left hip pain 12/17/2010   MCI (mild cognitive impairment) 07/22/2016   Nasal lesion 07/22/2016   Peripheral vascular disease ([name])    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   Polymyalgia ([name]) 03/21/2014   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   Preventative health care 12/17/2010   Shingles 1982   right abdominal wall   Sleep apnea    Staph skin infection 07/22/2016   Thyroid  nodule 11/29/2014   Urinary hesitancy 09/30/2016   Vitamin D  deficiency 10/14/2011    PAST SURGICAL HISTORY: Past Surgical History:  Procedure Laterality Date   CATARACT EXTRACTION Bilateral    COLONOSCOPY     CORONARY ARTERY BYPASS GRAFT N/A 07/03/2023   Procedure: CORONARY ARTERY BYPASS GRAFTING X 3, USING LEFT INTERNAL MAMMARY ARTERY AND ENDOSCOPIC VEIN HARVEST RIGHT GREATER SAPHENOUS VEIN;  Surgeon: Lucas Dorise POUR, MD;  Location: [facility] OR;  Service: Open Heart Surgery;  Laterality: N/A;   INTRAOPERATIVE TRANSESOPHAGEAL ECHOCARDIOGRAM N/A 07/03/2023   Procedure: ECHOCARDIOGRAM, TRANSESOPHAGEAL, INTRAOPERATIVE;  Surgeon: Lucas Dorise POUR, MD;  Location: [facility] OR;  Service: Open Heart Surgery;  Laterality: N/A;   LEFT HEART CATH AND CORONARY ANGIOGRAPHY N/A 06/11/2023   Procedure: LEFT HEART CATH AND CORONARY ANGIOGRAPHY;  Surgeon: Wendel Lurena POUR, MD;  Location: [facility] INVASIVE CV LAB;  Service: Cardiovascular;  Laterality: N/A;   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   4 surgeries    FAMILY HISTORY: Family History  Problem Relation Age of Onset   Cancer Mother        unknown- everywhere   Cancer Sister        breast    SOCIAL HISTORY: Social History   Socioeconomic History   Marital status: Married    Spouse name: Not on file    Number of children: Not on file   Years of education: Not on file   Highest education level: Not on file  Occupational History   Occupation: retired  Tobacco Use   Smoking status: Former    Current packs/day: 0.00    Average packs/day: 1 pack/day for 20.0 years (20.0 ttl pk-yrs)    Types: Cigarettes    Start date: 03/03/1960    Quit date: 03/03/1980    Years since quitting: 43.6   Smokeless tobacco: Never  Vaping Use   Vaping status: Never Used  Substance and Sexual Activity  Alcohol use: No    Alcohol/week: 0.0 standard drinks of alcohol   Drug use: No   Sexual activity: Not on file  Other Topics Concern   Not on file  Social History Narrative   Not on file   Social Drivers of Health   Financial Resource Strain: Low Risk  (03/03/2023)   Overall Financial Resource Strain (CARDIA)    Difficulty of Paying Living Expenses: Not hard at all  Food Insecurity: Low Risk  (09/22/2023)   Received from [facility]   Hunger Vital Sign    Within the past 12 months, you worried that your food would run out before you got money to buy more: Never true    Within the past 12 months, the food you bought just didn't last and you didn't have money to get more. : Never true  Transportation Needs: No Transportation Needs (09/22/2023)   [facility]   Transportation    In the past 12 months, has lack of reliable transportation kept you from medical appointments, meetings, work or from getting things needed for daily living? : No  Physical Activity: Sufficiently Active (03/03/2023)   Exercise Vital Sign    Days of Exercise per Week: 5 days    Minutes of Exercise per Session: 30 min  Stress: No Stress Concern Present (03/03/2023)   [employer] of Occupational Health - Occupational Stress Questionnaire    Feeling of Stress : Not at all  Social Connections: Moderately Isolated (07/03/2023)   Social Connection and Isolation Panel    Frequency of Communication with Friends and  Family: More than three times a week    Frequency of Social Gatherings with Friends and Family: More than three times a week    Attends Religious Services: Never    Database administrator or Organizations: No    Attends Banker Meetings: Never    Marital Status: Married  Catering manager Violence: Not At Risk (07/10/2023)   Humiliation, Afraid, Rape, and Kick questionnaire    Fear of Current or Ex-Partner: No    Emotionally Abused: No    Physically Abused: No    Sexually Abused: No     PHYSICAL EXAM  GENERAL EXAM/CONSTITUTIONAL: Vitals:  Vitals:   09/30/23 1152  BP: 112/76  Pulse: 87  Weight: 166 lb (75.3 kg)  Height: 5' 11 (1.803 m)   Body mass index is 23.15 kg/m. Wt Readings from Last 3 Encounters:  09/30/23 166 lb (75.3 kg)  08/25/23 167 lb 1.7 oz (75.8 kg)  08/05/23 164 lb (74.4 kg)   Patient is in no distress; well developed, nourished and groomed; neck is supple  CARDIOVASCULAR: Examination of carotid arteries is normal; no carotid bruits Regular rate and rhythm, no murmurs Examination of peripheral vascular system by observation and palpation is normal  EYES: Ophthalmoscopic exam of optic discs and posterior segments is normal; no papilledema or hemorrhages No results found.  MUSCULOSKELETAL: Gait, strength, tone, movements noted in Neurologic exam below  NEUROLOGIC: MENTAL STATUS:      No data to display         awake, alert, oriented to person, place and time recent and remote memory intact normal attention and concentration language fluent, comprehension intact, naming intact fund of knowledge appropriate  CRANIAL NERVE:  2nd - no papilledema on fundoscopic exam 2nd, 3rd, 4th, 6th - pupils equal and reactive to light, visual fields full to confrontation, extraocular muscles intact, no nystagmus 5th - facial sensation symmetric 7th -  facial strength symmetric 8th - hearing intact 9th - palate elevates symmetrically, uvula  midline 11th - shoulder shrug symmetric 12th - tongue protrusion midline  MOTOR:  normal bulk and tone, full strength in the BUE, 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  Lab Results  Component Value Date   WBC 6.5 09/30/2023   HGB 12.9 (L) 09/30/2023   HCT 39.6 09/30/2023   MCV 87.0 09/30/2023   PLT 220.0 09/30/2023      Component Value Date/Time   NA 139 09/30/2023 0829   NA 143 06/04/2023 1429   NA 143 11/16/2012 1145   K 4.9 09/30/2023 0829   K 4.4 11/16/2012 1145   CL 106 09/30/2023 0829   CO2 26 09/30/2023 0829   CO2 27 11/16/2012 1145   GLUCOSE 124 (H) 09/30/2023 0829   GLUCOSE 99 11/16/2012 1145   BUN 17 09/30/2023 0829   BUN 16 06/04/2023 1429   BUN 16.2 11/16/2012 1145   CREATININE 1.09 09/30/2023 0829   CREATININE 0.90 03/21/2014 1521   CREATININE 1.1 11/16/2012 1145   CALCIUM  8.7 09/30/2023 0829   CALCIUM  9.3 11/16/2012 1145   PROT 6.7 09/30/2023 0829   PROT 7.8 11/16/2012 1145   ALBUMIN  4.0 09/30/2023 0829   ALBUMIN  3.8 11/16/2012 1145   AST 19 09/30/2023 0829   AST 23 11/16/2012 1145   ALT 15 09/30/2023 0829   ALT 40 11/16/2012 1145   ALKPHOS 70 09/30/2023 0829   ALKPHOS 48 11/16/2012 1145   BILITOT 0.5 09/30/2023 0829   BILITOT 0.75 11/16/2012 1145   GFRNONAA >60 07/05/2023 0323   GFRAA 58 (L) 09/11/2014 1819   Lab Results  Component Value Date   CHOL 86 09/30/2023   HDL 34.50 (L) 09/30/2023   LDLCALC 27 09/30/2023   LDLDIRECT 156.0 12/17/2021   TRIG 125.0 09/30/2023   CHOLHDL 3 09/30/2023   Lab Results  Component Value Date   HGBA1C 6.9 (H) 09/30/2023   Lab  Results  Component Value Date   VITAMINB12 804 09/30/2023   Lab Results  Component Value Date   TSH 0.78 09/30/2023    10/13/22 [facility] EMG Abnormal study. 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    ASSESSMENT AND PLAN  78 y.o. year old male here with:  Meds tried: gabapentin   Dx:  1. OSA (obstructive sleep apnea)   2. RBD (REM behavioral disorder)     PLAN:  BURNING PAIN IN RIGHT FOOT --> IDIOPATHIC NEUROPATHY (started ~2021; prior workup negative by Dr. Payton) - continue duloxetine  60mg  in AM, 30mg  in PM - consider pregabalin in future if pain worsens  MCI - try to stay active physically and get some exercise (at least 15-30 minutes per day) - eat a nutritious diet with lean protein, plants / vegetables, whole grains; avoid ultra-processed foods - increase social activities, brain stimulation, games, puzzles, hobbies, crafts, arts, music; try new activities; keep it fun! - aim for at least 7-8 hours sleep per night (or more) - avoid smoking and alcohol - caution with medications, finances, driving - safety / supervision issues reviewed  ESSENTIAL TREMOR - mild; monitor  SLEEP APNEA - using dental appliance since > 5 years, but not sure if adequately working - refer to sleep study consult (last sleep study ~2020 or earlier)  REM BEHAVIOR SLEEP DISORDER - consider clonazepam 0.25-0.5mg  at bedtime; will ask sleep consult for guidance  Orders Placed This Encounter  Procedures   Ambulatory referral to Sleep Studies   Meds ordered this encounter  Medications   DULoxetine  (CYMBALTA ) 60 MG capsule    Sig: Take 1 capsule (60 mg total) by mouth in the morning.    Dispense:  90 capsule    Refill:  4   DULoxetine  (CYMBALTA ) 30 MG capsule    Sig: Take 1 capsule (30 mg total) by mouth at bedtime.    Dispense:  90 capsule    Refill:  4   Return in about 1 year (around 09/29/2024)  for MyChart visit (15 min).    EDUARD FABIENE HANLON, MD 09/30/2023, 12:13 PM Certified in Neurology, Neurophysiology and Neuroimaging  [facility] [address], [address] [place], [place] [address] [phone]

## 2023-10-02 ENCOUNTER — Encounter (HOSPITAL_COMMUNITY)
Admission: RE | Admit: 2023-10-02 | Discharge: 2023-10-02 | Disposition: A | Discharge status: Home / Self Care | Source: Ambulatory Visit | Attending: Cardiology | Admitting: Cardiology

## 2023-10-02 DIAGNOSIS — Z48812 Encounter for surgical aftercare following surgery on the circulatory system: Secondary | ICD-10-CM | POA: Insufficient documentation

## 2023-10-02 DIAGNOSIS — Z951 Presence of aortocoronary bypass graft: Secondary | ICD-10-CM | POA: Insufficient documentation

## 2023-10-02 DIAGNOSIS — M542 Cervicalgia: Secondary | ICD-10-CM | POA: Diagnosis not present

## 2023-10-05 ENCOUNTER — Encounter (HOSPITAL_COMMUNITY)
Admission: RE | Admit: 2023-10-05 | Discharge: 2023-10-05 | Disposition: A | Discharge status: Home / Self Care | Source: Ambulatory Visit | Attending: Cardiology | Admitting: Cardiology

## 2023-10-05 DIAGNOSIS — Z48812 Encounter for surgical aftercare following surgery on the circulatory system: Secondary | ICD-10-CM | POA: Diagnosis not present

## 2023-10-05 DIAGNOSIS — Z951 Presence of aortocoronary bypass graft: Secondary | ICD-10-CM

## 2023-10-06 DIAGNOSIS — Z79899 Other long term (current) drug therapy: Secondary | ICD-10-CM | POA: Diagnosis not present

## 2023-10-06 DIAGNOSIS — G609 Hereditary and idiopathic neuropathy, unspecified: Secondary | ICD-10-CM | POA: Diagnosis not present

## 2023-10-06 DIAGNOSIS — G3184 Mild cognitive impairment, so stated: Secondary | ICD-10-CM | POA: Diagnosis not present

## 2023-10-06 DIAGNOSIS — G4733 Obstructive sleep apnea (adult) (pediatric): Secondary | ICD-10-CM | POA: Diagnosis not present

## 2023-10-06 DIAGNOSIS — G250 Essential tremor: Secondary | ICD-10-CM | POA: Diagnosis not present

## 2023-10-06 DIAGNOSIS — Z760 Encounter for issue of repeat prescription: Secondary | ICD-10-CM | POA: Diagnosis not present

## 2023-10-07 ENCOUNTER — Encounter (HOSPITAL_COMMUNITY)
Admission: RE | Admit: 2023-10-07 | Discharge: 2023-10-07 | Disposition: A | Discharge status: Home / Self Care | Source: Ambulatory Visit | Attending: Cardiology

## 2023-10-07 DIAGNOSIS — Z951 Presence of aortocoronary bypass graft: Secondary | ICD-10-CM

## 2023-10-07 DIAGNOSIS — Z48812 Encounter for surgical aftercare following surgery on the circulatory system: Secondary | ICD-10-CM | POA: Diagnosis not present

## 2023-10-07 NOTE — Assessment & Plan Note (Signed)
 Stable on Armour Thyroid . Lab Results  Component Value Date   TSH 0.78 09/30/2023

## 2023-10-07 NOTE — Assessment & Plan Note (Signed)
 Supplement and monitor

## 2023-10-07 NOTE — Assessment & Plan Note (Signed)
 Stable on duloxetine

## 2023-10-07 NOTE — Assessment & Plan Note (Signed)
 Follows with Cardiology.  Tolerating statin.  Encourage heart healthy diet such as MIND or DASH diet, increase exercise, avoid trans fats, simple carbohydrates and processed foods, consider a krill or fish or flaxseed oil cap daily.

## 2023-10-07 NOTE — Assessment & Plan Note (Signed)
 hgba1c acceptable, minimize simple carbs. Increase exercise as tolerated.

## 2023-10-07 NOTE — Assessment & Plan Note (Signed)
-

## 2023-10-07 NOTE — Assessment & Plan Note (Signed)
 No recent flares

## 2023-10-07 NOTE — Progress Notes (Unsigned)
 Subjective:     Patient ID: Paul Ryan, male    DOB: 01/06/1946, 78 y.o.   MRN: [id number]  No chief complaint on file.   HPI  Paul Ryan is a pleasant 78 year old male presents for follow-up chronic conditions.  PMHx-GERD, CAD, DM, history of DVT, HLD, hypothyroid, ED, PVD, OSA, vitamin D  deficiency.  Eating very clean, limiting carbs and simple sugars. Sleeping well and staying active. Walks approx 3 miles most days, practices Tai Chi often.  Follows with-gastroenterology, cardiology, pulmonology, hematology/oncology, neurology.    Patient Care Team: Domenica Harlene LABOR, MD as PCP - General (Family Medicine) Jeffrie Oneil BROCKS, MD as PCP - Cardiology (Cardiology) Nonie Lonni RAMAN, MD (Ophthalmology) Luke Delon PARAS, PharmD (Inactive) as Pharmacist (Pharmacist) Gershon Donnice SAUNDERS, DPM as Consulting Physician (Podiatry)   Follows with Urology as needed- Dr. Brunetta- follows    CAD- s/p CABGx3-May 2025-follows with cardiology  Currently participating in Cardiac Rehab and enjoying the program, which includes healthy cooking, nutrition education, mindfulness practices, and physical activity  DM Pt has been limiting simple carbs and simple sugars in diet, eating more protien.  Blood sugars have been between 70 and 120s.  Denies hypoglycemic events. Lab Results  Component Value Date   HGBA1C 6.9 (H) 09/30/2023    HLD Repatha  140 mg every 2 weeks, rosuvastatin  10 mg daily tablet  Chronic DVT of lower extremity-follows with hematology/oncology On chronic anticoagulation-warfarin  GERD-Protonix  40 mg daily- follows with Gastroenterology  Hypothyroidism-thyroid  (Armour Thyroid ) 60 mg on M,W,Th, Fri, Sun Lab Results  Component Value Date   TSH 0.78 09/30/2023    Allergy-cetirizine  (Zyrtec ) 10 mg daily  Vitamin D  deficiency-cholecalciferol 1 capsule daily  Burning pain in right foot-idiopathic & essential tremor-follows with neurology Duloxetine  60 mg a.m., 30 mg p.m. Reports  medicine has been helping with neuropathy.  OSA-follows with pulmonology  Chronic pain -tizanidine  2 mg 0.5 to 2 tablets by mouth 2 times daily as needed for muscle spasms  Patient denies fever, chills, SOB, CP, palpitations, dyspnea, edema, HA, vision changes, N/V/D, abdominal pain, urinary symptoms, rash, weight changes, and recent illness or hospitalizations.   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   Hyperglycemia 12/26/2014   Hyperlipidemia    Hypothyroid 06/10/2011   Insomnia 04/24/2016   Left hip pain 12/17/2010   MCI (mild cognitive impairment) 07/22/2016   Nasal lesion 07/22/2016   Peripheral vascular disease ([name])    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   Polymyalgia ([name]) 03/21/2014   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   Preventative health care 12/17/2010   Shingles 1982   right abdominal wall   Sleep apnea    Staph skin infection 07/22/2016   Thyroid  nodule 11/29/2014   Urinary hesitancy 09/30/2016   Vitamin D  deficiency 10/14/2011    Past Surgical  History:  Procedure Laterality Date   CATARACT EXTRACTION Bilateral    COLONOSCOPY     CORONARY ARTERY BYPASS GRAFT N/A 07/03/2023   Procedure: CORONARY ARTERY BYPASS GRAFTING X 3, USING LEFT INTERNAL MAMMARY ARTERY AND ENDOSCOPIC VEIN HARVEST RIGHT GREATER SAPHENOUS VEIN;  Surgeon: Lucas Dorise POUR, MD;  Location: [facility] OR;  Service: Open Heart  Surgery;  Laterality: N/A;   INTRAOPERATIVE TRANSESOPHAGEAL ECHOCARDIOGRAM N/A 07/03/2023   Procedure: ECHOCARDIOGRAM, TRANSESOPHAGEAL, INTRAOPERATIVE;  Surgeon: Lucas Dorise POUR, MD;  Location: [facility] OR;  Service: Open Heart Surgery;  Laterality: N/A;   LEFT HEART CATH AND CORONARY ANGIOGRAPHY N/A 06/11/2023   Procedure: LEFT HEART CATH AND CORONARY ANGIOGRAPHY;  Surgeon: Wendel Lurena POUR, MD;  Location: [facility]VASIVE [facility];  Service: Cardiovascular;  Laterality: N/A;   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   4 surgeries    Family History  Problem Relation Age of Onset   Cancer Mother        unknown- everywhere   Cancer Sister        breast    Social History   Socioeconomic History   Marital status: Married    Spouse name: Not on file   Number of children: Not on file   Years of education: Not on file   Highest education level: Not on file  Occupational History   Occupation: retired  Tobacco Use   Smoking status: Former    Current packs/day: 0.00    Average packs/day: 1 pack/day for 20.0 years (20.0 ttl pk-yrs)    Types: Cigarettes    Start date: 03/03/1960    Quit date: 03/03/1980    Years since quitting: 43.6   Smokeless tobacco: Never  Vaping Use   Vaping status: Never Used  Substance and Sexual Activity   Alcohol use: No    Alcohol/week: 0.0 standard drinks of alcohol   Drug use: No   Sexual activity: Not on file  Other Topics Concern   Not on file  Social History Narrative   Not on file   Social Drivers of Health   Financial Resource Strain: Low Risk  (10/08/2023)   Overall Financial Resource Strain (CARDIA)    Difficulty of Paying Living Expenses: Not hard at all  Food Insecurity: No Food Insecurity (10/08/2023)   Hunger Vital Sign    Worried About Running Out of Food in the Last Year: Never true    Ran Out of Food in the Last Year: Never true  Transportation Needs: No Transportation Needs (10/08/2023)   PRAPARE - Administrator, Civil Service (Medical):  No    Lack of Transportation (Non-Medical): No  Physical Activity: Sufficiently Active (10/08/2023)   Exercise Vital Sign    Days of Exercise per Week: 7 days    Minutes of Exercise per Session: 50 min  Stress: No Stress Concern Present (10/08/2023)   [employer] of Occupational Health - Occupational Stress Questionnaire    Feeling of Stress: Only a little  Social Connections: Unknown (10/08/2023)   Social Connection and Isolation Panel    Frequency of Communication with Friends and Family: More than three times a week    Frequency of Social Gatherings with Friends and Family: Three times a week    Attends Religious Services: Never    Active Member of Clubs or Organizations: Not on file    Attends Club or Organization Meetings: Not on file    Marital Status: Married  Intimate Partner Violence: Not At Risk (07/10/2023)  Humiliation, Afraid, Rape, and Kick questionnaire    Fear of Current or Ex-Partner: No    Emotionally Abused: No    Physically Abused: No    Sexually Abused: No    Outpatient Medications Prior to Visit  Medication Sig Dispense Refill   ACCU-CHEK GUIDE TEST test strip USE TO CHECK BLOOD SUGAR ONCE DAILY . E11.9 100 strip 1   Accu-Chek Softclix Lancets lancets USE AS DIRECTED TO CHECK SUGAR ONCE DAILY.E11.9 100 each 1   acetaminophen  (TYLENOL ) 325 MG tablet Take 2 tablets (650 mg total) by mouth every 6 (six) hours as needed for mild pain (pain score 1-3).     ALPHA LIPOIC ACID PO Take 1 capsule by mouth daily.     aspirin  EC 81 MG tablet Take 1 tablet (81 mg total) by mouth daily. Swallow whole.     Blood Glucose Monitoring Suppl (ONETOUCH VERIO FLEX SYSTEM) w/Device KIT Use to check blood sugar once a day. DX E11.9 1 kit 0   cetirizine  (ZYRTEC ) 10 MG tablet Take 10 mg by mouth at bedtime.     Cholecalciferol (VITAMIN D -3 PO) Take 1 capsule by mouth daily.     Coenzyme Q10 (COQ10) 100 MG CAPS Take 1 capsule by mouth daily.     DULoxetine  (CYMBALTA ) 30 MG capsule Take  1 capsule (30 mg total) by mouth at bedtime. 90 capsule 4   DULoxetine  (CYMBALTA ) 60 MG capsule Take 1 capsule (60 mg total) by mouth in the morning. 90 capsule 4   Evolocumab  (REPATHA  SURECLICK) 140 MG/ML SOAJ Inject 140 mg into the skin every 14 (fourteen) days. 6 mL 3   famotidine  (PEPCID ) 40 MG tablet Take 40 mg by mouth daily.     MAGNESIUM  PO Take 1 capsule by mouth daily.     Misc Natural Products (MAGIC MUSHROOM MIX) CAPS Take 1 capsule by mouth daily. 5-in-1 Mushroom Complex     pantoprazole  (PROTONIX ) 40 MG tablet TAKE 1 TABLET BY MOUTH EVERY DAY 90 tablet 1   rosuvastatin  (CRESTOR ) 10 MG tablet Take 1 tablet (10 mg total) by mouth every evening.     thyroid  (ARMOUR THYROID ) 60 MG tablet TAKE ONE TABLET BY MOUTH ON MONDAY, WEDNESDAY, THURSDAY, FRIDAY, AND SUNDAY. 65 tablet 1   tiZANidine  (ZANAFLEX ) 2 MG tablet Take 0.5-2 tablets (1-4 mg total) by mouth 2 (two) times daily as needed for muscle spasms. 30 tablet 1   warfarin (COUMADIN ) 4 MG tablet Take 0.5-1 tablets (2-4 mg total) by mouth See admin instructions. 4 mg on Sun Mon Wed Fri and 2 mg Tues Thurs and Sat 90 tablet 1   zinc gluconate 50 MG tablet Take 50 mg by mouth daily.     empagliflozin  (JARDIANCE ) 10 MG TABS tablet Take 1 tablet (10 mg total) by mouth daily before breakfast. (Patient not taking: Reported on 10/08/2023) 30 tablet 3   No facility-administered medications prior to visit.    No Known Allergies  ROS    See HPI Objective:    Physical Exam  General: No acute distress. Awake and conversant.  Eyes: Normal conjunctiva, anicteric. Round symmetric pupils.  ENT: Hearing grossly intact. No nasal discharge.  Neck: Neck is supple. No masses or thyromegaly.  Respiratory: CTAB. Respirations are non-labored. No wheezing.  Skin: Warm. No rashes or ulcers.  Psych: Alert and oriented. Cooperative, Appropriate mood and affect, Normal judgment.  CV: RRR. No murmur. No lower extremity edema.  MSK: No clubbing or  cyanosis.  Neuro:  CN II-XII grossly normal.  DM Foot exam-Sensation intact throughout both feet except for mild decreased sensitivity over b/l great toes. Dorsalis pedis +1 b/l   BP 108/68 (BP Location: Left Arm, Patient Position: Sitting, Cuff Size: Normal)   Pulse 65   Temp 97.8 F (36.6 C) (Oral)   Resp 12   Ht 5' 11 (1.803 m)   Wt 164 lb 9.6 oz (74.7 kg)   SpO2 96%   BMI 22.96 kg/m  Wt Readings from Last 3 Encounters:  10/08/23 164 lb 9.6 oz (74.7 kg)  09/30/23 166 lb (75.3 kg)  08/25/23 167 lb 1.7 oz (75.8 kg)       Assessment & Plan:   Problem List Items Addressed This Visit     Anxiety - Primary   Stable on duloxetine .      B12 deficiency   Supplement and monitor.      BPH (benign prostatic hyperplasia)   Follows with Urology      Hyperlipidemia   Follows with Cardiology.  Tolerating statin.  Encourage heart healthy diet such as MIND or DASH diet, increase exercise, avoid trans fats, simple carbohydrates and processed foods, consider a krill or fish or flaxseed oil cap daily.        Hypothyroid   Stable on Armour Thyroid . Lab Results  Component Value Date   TSH 0.78 09/30/2023         Polymyalgia rheumatica ([name])   No recent flares.      Type 2 diabetes mellitus with hyperlipidemia ([name])   hgba1c acceptable, minimize simple carbs. Increase exercise as tolerated.       Vitamin D  deficiency   Supplement and monitor       Portions of this note were dictated using DRAGON voice recognition software. Please disregard any errors in transcription.    I have discontinued Hayzen Mcgann's empagliflozin . I am also having him maintain his OneTouch Verio Flex System, pantoprazole , Accu-Chek Softclix Lancets, Accu-Chek Guide Test, Repatha  SureClick, zinc gluconate, CoQ10, cetirizine , warfarin, Cholecalciferol (VITAMIN D -3 PO), ALPHA LIPOIC ACID PO, MAGNESIUM  PO, Magic Mushroom Mix, aspirin  EC, rosuvastatin , acetaminophen , thyroid , tiZANidine , famotidine ,  DULoxetine , and DULoxetine .  No orders of the defined types were placed in this encounter.

## 2023-10-08 ENCOUNTER — Ambulatory Visit: Admitting: Family Medicine

## 2023-10-08 ENCOUNTER — Ambulatory Visit: Admitting: Student

## 2023-10-08 ENCOUNTER — Encounter: Payer: Self-pay | Admitting: Student

## 2023-10-08 VITALS — BP 108/68 | HR 65 | Temp 97.8°F | Resp 12 | Ht 71.0 in | Wt 164.6 lb

## 2023-10-08 DIAGNOSIS — E7800 Pure hypercholesterolemia, unspecified: Secondary | ICD-10-CM

## 2023-10-08 DIAGNOSIS — E1169 Type 2 diabetes mellitus with other specified complication: Secondary | ICD-10-CM

## 2023-10-08 DIAGNOSIS — E559 Vitamin D deficiency, unspecified: Secondary | ICD-10-CM | POA: Diagnosis not present

## 2023-10-08 DIAGNOSIS — F419 Anxiety disorder, unspecified: Secondary | ICD-10-CM

## 2023-10-08 DIAGNOSIS — M353 Polymyalgia rheumatica: Secondary | ICD-10-CM | POA: Diagnosis not present

## 2023-10-08 DIAGNOSIS — Z125 Encounter for screening for malignant neoplasm of prostate: Secondary | ICD-10-CM

## 2023-10-08 DIAGNOSIS — I25810 Atherosclerosis of coronary artery bypass graft(s) without angina pectoris: Secondary | ICD-10-CM

## 2023-10-08 DIAGNOSIS — N400 Enlarged prostate without lower urinary tract symptoms: Secondary | ICD-10-CM

## 2023-10-08 DIAGNOSIS — E785 Hyperlipidemia, unspecified: Secondary | ICD-10-CM

## 2023-10-08 DIAGNOSIS — E538 Deficiency of other specified B group vitamins: Secondary | ICD-10-CM | POA: Diagnosis not present

## 2023-10-08 DIAGNOSIS — E039 Hypothyroidism, unspecified: Secondary | ICD-10-CM

## 2023-10-08 NOTE — Addendum Note (Signed)
 Addended by: WHEELER HARLENE CROME on: 10/08/2023 01:21 PM   Modules accepted: Orders

## 2023-10-08 NOTE — Assessment & Plan Note (Signed)
 CABG May 2025, Dr. Lucas.  Patient is doing well, participating in cardiac rehab.  Continue to follow-up with cardiology.

## 2023-10-09 ENCOUNTER — Encounter (HOSPITAL_COMMUNITY)
Admission: RE | Admit: 2023-10-09 | Discharge: 2023-10-09 | Disposition: A | Discharge status: Home / Self Care | Source: Ambulatory Visit | Attending: Cardiology | Admitting: Cardiology

## 2023-10-09 ENCOUNTER — Encounter: Payer: Self-pay | Admitting: Surgery

## 2023-10-09 DIAGNOSIS — Z951 Presence of aortocoronary bypass graft: Secondary | ICD-10-CM

## 2023-10-09 DIAGNOSIS — Z48812 Encounter for surgical aftercare following surgery on the circulatory system: Secondary | ICD-10-CM | POA: Diagnosis not present

## 2023-10-09 NOTE — Progress Notes (Signed)
 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 E11.9, Disp: 100 strip, Rfl: 1   Accu-Chek Softclix Lancets lancets, USE AS DIRECTED TO CHECK SUGAR ONCE DAILY.E11.9, Disp: 100 each, Rfl: 1   acetaminophen  (TYLENOL ) 325 MG tablet, Take 2 tablets (650 mg total) by mouth every 6 (six) hours as needed for mild pain (pain score 1-3)., Disp: , Rfl:    ALPHA LIPOIC ACID PO, Take 1 capsule by mouth daily., Disp: , Rfl:    aspirin  EC 81 MG tablet, Take 1 tablet (81 mg total) by mouth daily. Swallow whole., Disp: , Rfl:    Blood Glucose Monitoring Suppl (ONETOUCH VERIO FLEX SYSTEM) w/Device KIT, Use to check blood sugar once a day. DX E11.9, Disp: 1 kit, Rfl: 0   cetirizine  (ZYRTEC ) 10 MG tablet, Take 10 mg by mouth at bedtime., Disp: , Rfl:    Cholecalciferol (VITAMIN D -3 PO), Take 1 capsule by mouth daily., Disp: , Rfl:    Coenzyme Q10 (COQ10) 100 MG CAPS, Take 1 capsule by mouth daily., Disp: , Rfl:    DULoxetine  (CYMBALTA ) 30 MG capsule, Take 1 capsule (30 mg total) by mouth at bedtime., Disp: 90 capsule, Rfl: 4   DULoxetine  (CYMBALTA ) 60 MG capsule, Take 1 capsule (60 mg total) by mouth in the morning., Disp: 90 capsule, Rfl: 4   Evolocumab  (REPATHA  SURECLICK) 140 MG/ML SOAJ, Inject 140 mg into the skin every 14 (fourteen) days., Disp: 6 mL, Rfl: 3   famotidine  (PEPCID ) 40 MG  tablet, Take 40 mg by mouth daily., Disp: , Rfl:    MAGNESIUM  PO, Take 1 capsule by mouth daily., Disp: , Rfl:    Misc Natural Products (MAGIC MUSHROOM MIX) CAPS, Take 1 capsule by mouth daily. 5-in-1 Mushroom Complex, Disp: , Rfl:    pantoprazole  (PROTONIX ) 40 MG tablet, TAKE 1 TABLET BY MOUTH EVERY DAY, Disp: 90 tablet, Rfl: 1   rosuvastatin  (CRESTOR ) 10 MG tablet, Take 1 tablet (10 mg total) by mouth every evening., Disp: , Rfl:    thyroid  (ARMOUR THYROID ) 60 MG tablet, TAKE ONE TABLET BY MOUTH ON MONDAY, WEDNESDAY, THURSDAY, FRIDAY, AND SUNDAY., Disp: 65 tablet, Rfl: 1   tiZANidine  (ZANAFLEX ) 2 MG tablet, Take 0.5-2 tablets (1-4 mg total) by mouth 2 (two) times daily as needed for muscle spasms., Disp: 30 tablet, Rfl: 1   warfarin (COUMADIN ) 4 MG tablet, Take 0.5-1 tablets (2-4 mg total) by mouth See admin instructions. 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   Hyperglycemia 12/26/2014   Hyperlipidemia    Hypothyroid 06/10/2011   Insomnia 04/24/2016   Left hip pain 12/17/2010   MCI (mild cognitive impairment) 07/22/2016   Nasal lesion 07/22/2016   Peripheral vascular disease ([name])    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   Polymyalgia ([name]) 03/21/2014   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   Preventative health care 12/17/2010   Shingles 1982   right abdominal wall    Sleep apnea    Staph skin infection 07/22/2016   Thyroid  nodule 11/29/2014   Urinary hesitancy 09/30/2016   Vitamin D  deficiency 10/14/2011    Tobacco Use: Social History   Tobacco Use  Smoking Status Former   Current packs/day: 0.00   Average packs/day: 1 pack/day for 20.0 years (20.0 ttl pk-yrs)   Types: Cigarettes   Start date: 03/03/1960   Quit date: 03/03/1980   Years since quitting: 43.6  Smokeless Tobacco Never    Labs: Review Flowsheet  More data exists      Latest Ref Rng & Units 08/14/2022 06/22/2023 07/01/2023 07/03/2023 09/30/2023  Labs for ITP Cardiac and Pulmonary Rehab  Cholestrol 0 - 200 mg/dL - 77  - - 86   LDL (calc) 0 - 99 mg/dL - 30  - - 27   HDL-C >60.99 mg/dL - 68.39  - - 65.49   Trlycerides 0.0 - 149.0 mg/dL - 22.9  - - 874.9   Hemoglobin A1c 4.6 - 6.5 % 6.7  6.8  6.3  - 6.9   PH, Arterial 7.35 - 7.45 - - - 7.336  7.318  7.366  7.351  7.365  7.341  7.323  -  PCO2 arterial 32 - 48 mmHg - - - 40.5  45.0  36.9  40.5  39.2  43.1  40.1  -  Bicarbonate 20.0 - 28.0 mmol/L - - - 21.7  23.1  21.3  22.4  22.4  23.5  23.3  20.8  -  TCO2 22 - 32 mmol/L - - - 23  25  22  24  25  24  24  25  25  22  22  24   -  Acid-base deficit 0.0 - 2.0 mmol/L - - - 4.0  3.0  4.0  3.0  3.0  3.0  2.0  5.0  -  O2 Saturation % - - - 98  98  99  100  100  89  100  100  -    Details       Multiple values from one day are sorted in reverse-chronological order         Capillary Blood Glucose: Lab Results  Component Value Date   GLUCAP 123 (H) 07/08/2023   GLUCAP 108 (H) 07/08/2023   GLUCAP 117 (H) 07/07/2023   GLUCAP 152 (H) 07/07/2023   GLUCAP 104 (H) 07/07/2023     Exercise Target Goals: Exercise Program Goal: Individual exercise prescription set using results from initial 6 min walk test and THRR while considering  patient's activity barriers and safety.   Exercise Prescription Goal: Initial exercise prescription builds to 30-45 minutes a day of aerobic activity, 2-3  days per week.  Home exercise guidelines will be given to  patient during program as part of exercise prescription that the participant will acknowledge.  Activity Barriers & Risk Stratification:  Activity Barriers & Cardiac Risk Stratification - 08/25/23 0825       Activity Barriers & Cardiac Risk Stratification   Activity Barriers Arthritis;Joint Problems;Neck/Spine Problems;Back Problems;Balance Concerns    Cardiac Risk Stratification High          6 Minute Walk:  6 Minute Walk     Row Name 08/25/23 0950         6 Minute Walk   Phase Initial     Distance 1912 feet     Walk Time 6 minutes     # of Rest Breaks 0     MPH 3.62     METS 3.7     RPE 11     Perceived Dyspnea  0     VO2 Peak 12.9     Symptoms No     Resting HR 60 bpm     Resting BP 102/64     Resting Oxygen Saturation  100 %     Exercise Oxygen Saturation  during 6 min walk 97 %     Max Ex. HR 98 bpm     Max Ex. BP 116/70     2 Minute Post BP 108/70        Oxygen Initial Assessment:   Oxygen Re-Evaluation:   Oxygen Discharge (Final Oxygen Re-Evaluation):   Initial Exercise Prescription:  Initial Exercise Prescription - 08/25/23 0800       Date of Initial Exercise RX and Referring Provider   Date 08/25/23    Referring Provider [name], MD    Expected Discharge Date 11/18/23      Bike   Level 2    Watts 40    Minutes 15    METs 3.7      Recumbant Elliptical   Level 2    RPM 60    Watts 75    Minutes 15    METs 3.7      Prescription Details   Frequency (times per week) 3    Duration Progress to 30 minutes of continuous aerobic without signs/symptoms of physical distress      Intensity   THRR 40-80% of Max Heartrate 56-113    Ratings of Perceived Exertion 11-13    Perceived Dyspnea 0-4      Progression   Progression Continue progressive overload as per policy without signs/symptoms or physical distress.      Resistance Training   Training Prescription Yes    Weight 4  lbs    Reps 10-15          Perform Capillary Blood Glucose checks as needed.  Exercise Prescription Changes:   Exercise Prescription Changes     Row Name 09/07/23 1623 09/23/23 1640 10/09/23 1709         Response to Exercise   Blood Pressure (Admit) 104/60 98/60 96/72      Blood Pressure (Exercise) 120/68 118/70 --     Blood Pressure (Exit) 88/64 92/60 102/64     Heart Rate (Admit) 64 bpm 73 bpm 67 bpm     Heart Rate (Exercise) 116 bpm 125 bpm 121 bpm     Heart Rate (Exit) 73 bpm 81 bpm 76 bpm     Rating of Perceived Exertion (Exercise) 8.5 8 9      Perceived Dyspnea (Exercise) 0 0 0     Symptoms low BP at discharge, asymptomatic -- --  Comments Pt frist day in the[facility] program Reviewed MET's, goals and home ExRx Pt graduated the [employer] program     Duration Progress to 30 minutes of  aerobic without signs/symptoms of physical distress Progress to 30 minutes of  aerobic without signs/symptoms of physical distress Progress to 30 minutes of  aerobic without signs/symptoms of physical distress     Intensity THRR unchanged THRR unchanged THRR unchanged       Progression   Progression Continue to progress workloads to maintain intensity without signs/symptoms of physical distress. Continue to progress workloads to maintain intensity without signs/symptoms of physical distress. Continue to progress workloads to maintain intensity without signs/symptoms of physical distress.     Average METs 4 6.35 6.55       Resistance Training   Training Prescription Yes No Yes     Weight 4 lbs 4 lbs 4 lbs     Reps 10-15 10-15 10-15     Time 10 Minutes 10 Minutes 10 Minutes       Bike   Level 2 3 4.5     Watts 38 71 71     Minutes 15 74 15     METs 3.6 7.3 7.3       Recumbant Elliptical   Level 2 3 3      RPM 68 70 68     Watts 92 101 108     Minutes 15 15 15      METs 4.4 5.4 5.8       Home Exercise Plan   Plans to continue exercise at -- Home (comment) Home (comment)      Frequency -- Add 4 additional days to program exercise sessions. Add 4 additional days to program exercise sessions.     Initial Home Exercises Provided -- 09/23/23 09/23/23        Exercise Comments:   Exercise Comments     Row Name 09/07/23 1628 09/23/23 1649 10/09/23 1712       Exercise Comments Pt first day in the [facility] program. Pt tolerated exercisew ell with an average MET level of 4. Pt is off to a good start and is learning his THRR, RPE and ExRx REVD MET's, goals and home ExRx. Pt tolerated exercise well with an average MET level of 6.35. Pt is doing well with exercise and is increasing WL, strength and stamina. He is feeling good with his goals and plans to add in weight lifting when his restrictions lift. Until then, he is walking, doing tai chi and squats for 30-45 mins 2-4 days a week. REVD MET's. Pt tolerated exercise well with an average MET level of 6.55. Pt is doing well and increasing MET's. He would like to push a little more, but his THRR is limiting. Will reach out to cardiology and see about THRR increase        Exercise Goals and Review:   Exercise Goals     Row Name 08/25/23 0809             Exercise Goals   Increase Physical Activity Yes       Intervention Develop an individualized exercise prescription for aerobic and resistive training based on initial evaluation findings, risk stratification, comorbidities and participant's personal goals.;Provide advice, education, support and counseling about physical activity/exercise needs.       Expected Outcomes Short Term: Attend rehab on a regular basis to increase amount of physical activity.;Long Term: Exercising regularly at least 3-5 days a week.;Long Term: Add in  home exercise to make exercise part of routine and to increase amount of physical activity.       Increase Strength and Stamina Yes       Intervention Provide advice, education, support and counseling about physical activity/exercise  needs.;Develop an individualized exercise prescription for aerobic and resistive training based on initial evaluation findings, risk stratification, comorbidities and participant's personal goals.       Expected Outcomes Short Term: Increase workloads from initial exercise prescription for resistance, speed, and METs.;Short Term: Perform resistance training exercises routinely during rehab and add in resistance training at home;Long Term: Improve cardiorespiratory fitness, muscular endurance and strength as measured by increased METs and functional capacity ( )       Able to understand and use rate of perceived exertion (RPE) scale Yes       Intervention Provide education and explanation on how to use RPE scale       Expected Outcomes Short Term: Able to use RPE daily in rehab to express subjective intensity level;Long Term:  Able to use RPE to guide intensity level when exercising independently       Knowledge and understanding of Target Heart Rate Range (THRR) Yes       Intervention Provide education and explanation of THRR including how the numbers were predicted and where they are located for reference       Expected Outcomes Short Term: Able to state/look up THRR;Long Term: Able to use THRR to govern intensity when exercising independently;Short Term: Able to use daily as guideline for intensity in rehab       Understanding of Exercise Prescription Yes       Intervention Provide education, explanation, and written materials on patient's individual exercise prescription       Expected Outcomes Short Term: Able to explain program exercise prescription;Long Term: Able to explain home exercise prescription to exercise independently          Exercise Goals Re-Evaluation :  Exercise Goals Re-Evaluation     Row Name 09/07/23 1627 09/23/23 1643           Exercise Goal Re-Evaluation   Exercise Goals Review Increase Physical Activity;Understanding of Exercise Prescription;Increase Strength and  Stamina;Knowledge and understanding of Target Heart Rate Range (THRR);Able to understand and use rate of perceived exertion (RPE) scale Increase Physical Activity;Understanding of Exercise Prescription;Increase Strength and Stamina;Knowledge and understanding of Target Heart Rate Range (THRR);Able to understand and use rate of perceived exertion (RPE) scale      Comments Pt first day in the [facility] program. Pt tolerated exercisew ell with an average MET level of 4. Pt is off to a good start and is learning his THRR, RPE and ExRx REVD MET's, goals and home ExRx. Pt tolerated exercise well with an average MET level of 6.35. Pt is doing well with exercise and is increasing WL, strength and stamina. He is feeling good with his goals and plans to add in weight lifting when his restrictions lift. Until then, he is walking, doing tai chi and squats for 30-45 mins 2-4 days a week.      Expected Outcomes Will continue to monitor pt and progress workloads as tolerated without sign or symptom Will continue to monitor pt and progress workloads as tolerated without sign or symptom         Discharge Exercise Prescription (Final Exercise Prescription Changes):  Exercise Prescription Changes - 10/09/23 1709       Response to Exercise   Blood Pressure (Admit) 96/72  Blood Pressure (Exit) 102/64    Heart Rate (Admit) 67 bpm    Heart Rate (Exercise) 121 bpm    Heart Rate (Exit) 76 bpm    Rating of Perceived Exertion (Exercise) 9    Perceived Dyspnea (Exercise) 0    Comments Pt graduated the [employer] program    Duration Progress to 30 minutes of  aerobic without signs/symptoms of physical distress    Intensity THRR unchanged      Progression   Progression Continue to progress workloads to maintain intensity without signs/symptoms of physical distress.    Average METs 6.55      Resistance Training   Training Prescription Yes    Weight 4 lbs    Reps 10-15    Time 10 Minutes      Bike   Level  4.5    Watts 71    Minutes 15    METs 7.3      Recumbant Elliptical   Level 3    RPM 68    Watts 108    Minutes 15    METs 5.8      Home Exercise Plan   Plans to continue exercise at Home (comment)    Frequency Add 4 additional days to program exercise sessions.    Initial Home Exercises Provided 09/23/23          Nutrition:  Target Goals: Understanding of nutrition guidelines, daily intake of sodium 1500mg , cholesterol 200mg , calories 30% from fat and 7% or less from saturated fats, daily to have 5 or more servings of fruits and vegetables.  Biometrics:   Post Biometrics - 08/25/23 0814        Post  Biometrics   Waist Circumference 37 inches    Hip Circumference 39 inches    Waist to Hip Ratio 0.95 %    Triceps Skinfold 10 mm    % Body Fat 23.1 %    Grip Strength 37 kg    Flexibility 13.3 in    Single Leg Stand 12.87 seconds          Nutrition Therapy Plan and Nutrition Goals:  Nutrition Therapy & Goals - 10/09/23 1102       Nutrition Therapy   Diet Heart Healthy Diet    Drug/Food Interactions Statins/Certain Fruits;Coumadin /Vit K      Personal Nutrition Goals   Nutrition Goal Patient to identify strategies for reducing cardiovascular risk by attending the [facility] education and nutrition series weekly.   goal in action.   Personal Goal #2 Patient to improve diet quality by using the plate method as a guide for meal planning to include lean protein/plant protein, fruits, vegetables, whole grains, nonfat dairy as part of a well-balanced diet.   goal in progress.   Comments Patient has medical history of CAD, CABGx3, Protein S deficency (treated with warfarin), DM2, hyperlipidemia. A1c has actually worsed but still remains <7%; jardiance  was stopped at 8/7 PCP follow-up. LDL is at goal (repatha , crestor ). He has maintained his weight since starting with our program. He continues to attend the [facility] education and nutrition series regularly. Patient will  benefit from participation in intensive cardiac rehab for nutrition education, exercise, and lifestyle modification      Intervention Plan   Intervention Prescribe, educate and counsel regarding individualized specific dietary modifications aiming towards targeted core components such as weight, hypertension, lipid management, diabetes, heart failure and other comorbidities.;Nutrition handout(s) given to patient.    Expected Outcomes Short Term Goal: Understand basic principles of  dietary content, such as calories, fat, sodium, cholesterol and nutrients.;Long Term Goal: Adherence to prescribed nutrition plan.          Nutrition Assessments:  MEDIFICTS Score Key: >=70 Need to make dietary changes  40-70 Heart Healthy Diet <= 40 Therapeutic Level Cholesterol Diet    Picture Your Plate Scores: <59 Unhealthy dietary pattern with much room for improvement. 41-50 Dietary pattern unlikely to meet recommendations for good health and room for improvement. 51-60 More healthful dietary pattern, with some room for improvement.  >60 Healthy dietary pattern, although there may be some specific behaviors that could be improved.    Nutrition Goals Re-Evaluation:  Nutrition Goals Re-Evaluation     Row Name 09/07/23 1550 10/09/23 1102           Goals   Current Weight 167 lb 1.7 oz (75.8 kg) 165 lb 5.5 oz (75 kg)      Comment HDL 3`, LDL 30, A1c 6.3 LDL 27, HDL 34, A1c 6.9      Expected Outcome Patient has medical history of CAD, CABGx3, Protein S deficency (treated with warfarin), preDM, hyperlipidemia. A1c is in a prediabetic range; will consider starting jardiance . LDL is at goal (repatha , crestor ). Patient will benefit from participation in intensive cardiac rehab for nutrition education, exercise, and lifestyle modification Patient has medical history of CAD, CABGx3, Protein S deficency (treated with warfarin), DM2, hyperlipidemia. A1c has actually worsed but still remains <7%; jardiance  was  stopped at 8/7 PCP follow-up. LDL is at goal (repatha , crestor ). He has maintained his weight since starting with our program. He continues to attend the [facility] education and nutrition series regularly. Patient will benefit from participation in intensive cardiac rehab for nutrition education, exercise, and lifestyle modification         Nutrition Goals Re-Evaluation:  Nutrition Goals Re-Evaluation     Row Name 09/07/23 1550 10/09/23 1102           Goals   Current Weight 167 lb 1.7 oz (75.8 kg) 165 lb 5.5 oz (75 kg)      Comment HDL 3`, LDL 30, A1c 6.3 LDL 27, HDL 34, A1c 6.9      Expected Outcome Patient has medical history of CAD, CABGx3, Protein S deficency (treated with warfarin), preDM, hyperlipidemia. A1c is in a prediabetic range; will consider starting jardiance . LDL is at goal (repatha , crestor ). Patient will benefit from participation in intensive cardiac rehab for nutrition education, exercise, and lifestyle modification Patient has medical history of CAD, CABGx3, Protein S deficency (treated with warfarin), DM2, hyperlipidemia. A1c has actually worsed but still remains <7%; jardiance  was stopped at 8/7 PCP follow-up. LDL is at goal (repatha , crestor ). He has maintained his weight since starting with our program. He continues to attend the [facility] education and nutrition series regularly. Patient will benefit from participation in intensive cardiac rehab for nutrition education, exercise, and lifestyle modification         Nutrition Goals Discharge (Final Nutrition Goals Re-Evaluation):  Nutrition Goals Re-Evaluation - 10/09/23 1102       Goals   Current Weight 165 lb 5.5 oz (75 kg)    Comment LDL 27, HDL 34, A1c 6.9    Expected Outcome Patient has medical history of CAD, CABGx3, Protein S deficency (treated with warfarin), DM2, hyperlipidemia. A1c has actually worsed but still remains <7%; jardiance  was stopped at 8/7 PCP follow-up. LDL is at goal (repatha , crestor ). He has  maintained his weight since starting with our program. He continues to attend  the [facility] education and nutrition series regularly. Patient will benefit from participation in intensive cardiac rehab for nutrition education, exercise, and lifestyle modification          Psychosocial: Target Goals: Acknowledge presence or absence of significant depression and/or stress, maximize coping skills, provide positive support system. Participant is able to verbalize types and ability to use techniques and skills needed for reducing stress and depression.  Initial Review & Psychosocial Screening:  Initial Psych Review & Screening - 08/25/23 0811       Initial Review   Current issues with None Identified   Pt is on cymbalta      Family Dynamics   Good Support System? Yes   Pt has spouse and daughter for support     Barriers   Psychosocial barriers to participate in program There are no identifiable barriers or psychosocial needs.      Screening Interventions   Interventions Encouraged to exercise          Quality of Life Scores:  Quality of Life - 08/25/23 0948       Quality of Life   Select Quality of Life      Quality of Life Scores   Health/Function Pre 29.2 %    Socioeconomic Pre 30 %    Psych/Spiritual Pre 28.93 %    Family Pre 30 %    GLOBAL Pre 29.43 %         Scores of 19 and below usually indicate a poorer quality of life in these areas.  A difference of  2-3 points is a clinically meaningful difference.  A difference of 2-3 points in the total score of the Quality of Life Index has been associated with significant improvement in overall quality of life, self-image, physical symptoms, and general health in studies assessing change in quality of life.  PHQ-9: Review Flowsheet  More data exists      10/08/2023 09/02/2023 08/25/2023 07/10/2023 03/03/2023  Depression screen PHQ 2/9  Decreased Interest 0 0 0 0 0  Down, Depressed, Hopeless 0 0 0 0 0  PHQ - 2 Score 0 0 0 0 0   Altered sleeping 0 - 0 - -  Tired, decreased energy 0 - 0 - -  Change in appetite 0 - 0 - -  Feeling bad or failure about yourself  0 - 0 - -  Trouble concentrating 0 - 0 - -  Moving slowly or fidgety/restless 0 - 0 - -  Suicidal thoughts 0 - - - -  PHQ-9 Score 0 - 0 - -  Difficult doing work/chores - - Not difficult at all - -   Interpretation of Total Score  Total Score Depression Severity:  1-4 = Minimal depression, 5-9 = Mild depression, 10-14 = Moderate depression, 15-19 = Moderately severe depression, 20-27 = Severe depression   Psychosocial Evaluation and Intervention:   Psychosocial Re-Evaluation:  Psychosocial Re-Evaluation     Row Name 09/07/23 1733 10/09/23 0940           Psychosocial Re-Evaluation   Current issues with None Identified None Identified      Interventions Encouraged to attend Cardiac Rehabilitation for the exercise Encouraged to attend Cardiac Rehabilitation for the exercise      Continue Psychosocial Services  No Follow up required No Follow up required         Psychosocial Discharge (Final Psychosocial Re-Evaluation):  Psychosocial Re-Evaluation - 10/09/23 0940       Psychosocial Re-Evaluation   Current issues with None  Identified    Interventions Encouraged to attend Cardiac Rehabilitation for the exercise    Continue Psychosocial Services  No Follow up required          Vocational Rehabilitation: Provide vocational rehab assistance to qualifying candidates.   Vocational Rehab Evaluation & Intervention:  Vocational Rehab - 08/25/23 9188       Initial Vocational Rehab Evaluation & Intervention   Assessment shows need for Vocational Rehabilitation No   Pt is retired         Education: Education Goals: Education classes will be provided on a weekly basis, covering required topics. Participant will state understanding/return demonstration of topics presented.    Education     Row Name 09/07/23 1600     Education   Cardiac  Education Topics Pritikin   Geographical information systems officer Psychosocial   Psychosocial Workshop Healthy Sleep for a Healthy Heart   Instruction Review Code 1- Verbalizes Understanding   Class Start Time 1400   Class Stop Time 1440   Class Time Calculation (min) 40 min    Row Name 09/11/23 1400     Education   Cardiac Education Topics Pritikin   Nurse, children's Exercise Physiologist   [facility] Psychosocial   Psychosocial How Our Thoughts Can Heal Our Hearts   Instruction Review Code 1- Verbalizes Understanding   Class Start Time 1400   Class Stop Time 1440   Class Time Calculation (min) 40 min    Row Name 09/14/23 1400     Education   Cardiac Education Topics Pritikin   Hospital doctor Education   General Education Hypertension and Heart Disease   Instruction Review Code 1- Verbalizes Understanding   Class Start Time 1403   Class Stop Time 1445   Class Time Calculation (min) 42 min    Row Name 09/16/23 1400     Education   Cardiac Education Topics Pritikin   Customer service manager   Weekly Topic Powerhouse Plant-Based Proteins   Instruction Review Code 1- Verbalizes Understanding   Class Start Time 1400   Class Stop Time 1440   Class Time Calculation (min) 40 min    Row Name 09/18/23 1500     Education   Cardiac Education Topics Pritikin   [facility] Workshops     Workshops   Educator Nurse   Select Exercise   Exercise Workshop Managing Heart Disease: Your Path to a Healthier Heart   Instruction Review Code 1- Verbalizes Understanding   Class Start Time 1403   Class Stop Time 1453   Class Time Calculation (min) 50 min    Row Name 09/21/23 1400     Education   Cardiac Education Topics Pritikin   Geographical information systems officer  Psychosocial   Psychosocial Workshop From Head to Heart: The Power of a Healthy Outlook   Instruction Review Code 1- Verbalizes Understanding   Class Start Time 1400   Class Stop Time 1445   Class Time Calculation (min) 45 min    Row Name 09/23/23 1400     Education   Cardiac Education Topics Pritikin   Customer service manager  Weekly Topic Tasty Appetizers and Snacks   Instruction Review Code 1- Verbalizes Understanding   Class Start Time 1400   Class Stop Time 1440   Class Time Calculation (min) 40 min    Row Name 09/25/23 1300     Education   Cardiac Education Topics Pritikin   Hospital doctor Education   General Education Heart Disease Risk Reduction   Instruction Review Code 1- Verbalizes Understanding   Class Start Time 1403   Class Stop Time 1437   Class Time Calculation (min) 34 min    Row Name 09/28/23 1300     Education   Cardiac Education Topics Pritikin   Nurse, children's Exercise Physiologist   [facility] Psychosocial   Instruction Review Code 1- Verbalizes Understanding   Class Start Time 1415   Class Stop Time 1450   Class Time Calculation (min) 35 min    Row Name 09/30/23 1400     Education   Cardiac Education Topics Pritikin   Customer service manager   Weekly Topic Adding Flavor - Sodium-Free   Instruction Review Code 1- Verbalizes Understanding   Class Start Time 1345   Class Stop Time 1435   Class Time Calculation (min) 50 min    Row Name 10/02/23 1400     Education   Cardiac Education Topics Pritikin   [facility] Workshops     Workshops   Educator Exercise Physiologist   Select Exercise   Exercise Workshop Location manager and Fall Prevention   Instruction Review Code 1- Verbalizes Understanding   Class Start Time 1402   Class Stop Time 1441   Class Time  Calculation (min) 39 min    Row Name 10/05/23 1500     Education   Cardiac Education Topics Pritikin   Glass blower/designer Nutrition   Nutrition Workshop Label Reading   Instruction Review Code 1- Teaching laboratory technician Start Time 1400   Class Stop Time 1450   Class Time Calculation (min) 50 min    Row Name 10/07/23 1500     Education   Cardiac Education Topics Pritikin   Customer service manager   Weekly Topic Fast and Healthy Breakfasts   Instruction Review Code 1- Verbalizes Understanding   Class Start Time 1400   Class Stop Time 1440   Class Time Calculation (min) 40 min    Row Name 10/09/23 1400     Education   Cardiac Education Topics Pritikin   Licensed conveyancer Nutrition   Nutrition Other   Instruction Review Code 1- Verbalizes Understanding   Class Start Time 1400   Class Stop Time 1445   Class Time Calculation (min) 45 min      Core Videos: Exercise    Move It!  Clinical staff conducted group or individual video education with verbal and written material and guidebook.  Patient learns the recommended Pritikin exercise program. Exercise with the goal of living a long, healthy life. Some of the health benefits of exercise include controlled diabetes, healthier blood pressure levels, improved cholesterol levels, improved heart and lung capacity, improved sleep, and better body composition.  Everyone should speak with their doctor before starting or changing an exercise routine.  Biomechanical Limitations Clinical staff conducted group or individual video education with verbal and written material and guidebook.  Patient learns how biomechanical limitations can impact exercise and how we can mitigate and possibly overcome limitations to have an impactful and balanced exercise routine.  Body Composition Clinical staff  conducted group or individual video education with verbal and written material and guidebook.  Patient learns that body composition (ratio of muscle mass to fat mass) is a key component to assessing overall fitness, rather than body weight alone. Increased fat mass, especially visceral belly fat, can put us  at increased risk for metabolic syndrome, type 2 diabetes, heart disease, and even death. It is recommended to combine diet and exercise (cardiovascular and resistance training) to improve your body composition. Seek guidance from your physician and exercise physiologist before implementing an exercise routine.  Exercise Action Plan Clinical staff conducted group or individual video education with verbal and written material and guidebook.  Patient learns the recommended strategies to achieve and enjoy long-term exercise adherence, including variety, self-motivation, self-efficacy, and positive decision making. Benefits of exercise include fitness, good health, weight management, more energy, better sleep, less stress, and overall well-being.  Medical   Heart Disease Risk Reduction Clinical staff conducted group or individual video education with verbal and written material and guidebook.  Patient learns our heart is our most vital organ as it circulates oxygen, nutrients, white blood cells, and hormones throughout the entire body, and carries waste away. Data supports a plant-based eating plan like the Pritikin Program for its effectiveness in slowing progression of and reversing heart disease. The video provides a number of recommendations to address heart disease.   Metabolic Syndrome and Belly Fat  Clinical staff conducted group or individual video education with verbal and written material and guidebook.  Patient learns what metabolic syndrome is, how it leads to heart disease, and how one can reverse it and keep it from coming back. You have metabolic syndrome if you have 3 of the following 5  criteria: abdominal obesity, high blood pressure, high triglycerides, low HDL cholesterol, and high blood sugar.  Hypertension and Heart Disease Clinical staff conducted group or individual video education with verbal and written material and guidebook.  Patient learns that high blood pressure, or hypertension, is very common in the [place] . Hypertension is largely due to excessive salt intake, but other important risk factors include being overweight, physical inactivity, drinking too much alcohol, smoking, and not eating enough potassium from fruits and vegetables. High blood pressure is a leading risk factor for heart attack, stroke, congestive heart failure, dementia, kidney failure, and premature death. Long-term effects of excessive salt intake include stiffening of the arteries and thickening of heart muscle and organ damage. Recommendations include ways to reduce hypertension and the risk of heart disease.  Diseases of Our Time - Focusing on Diabetes Clinical staff conducted group or individual video education with verbal and written material and guidebook.  Patient learns why the best way to stop diseases of our time is prevention, through food and other lifestyle changes. Medicine (such as prescription pills and surgeries) is often only a Band-Aid on the problem, not a long-term solution. Most common diseases of our time include obesity, type 2 diabetes, hypertension, heart disease, and cancer. The Pritikin Program is recommended and has been proven to help reduce, reverse, and/or prevent the damaging effects of metabolic syndrome.  Nutrition   Overview of  the Pritikin Eating Plan  Clinical staff conducted group or individual video education with verbal and written material and guidebook.  Patient learns about the Pritikin Eating Plan for disease risk reduction. The Pritikin Eating Plan emphasizes a wide variety of unrefined, minimally-processed carbohydrates, like fruits, vegetables,  whole grains, and legumes. Go, Caution, and Stop food choices are explained. Plant-based and lean animal proteins are emphasized. Rationale provided for low sodium intake for blood pressure control, low added sugars for blood sugar stabilization, and low added fats and oils for coronary artery disease risk reduction and weight management.  Calorie Density  Clinical staff conducted group or individual video education with verbal and written material and guidebook.  Patient learns about calorie density and how it impacts the Pritikin Eating Plan. Knowing the characteristics of the food you choose will help you decide whether those foods will lead to weight gain or weight loss, and whether you want to consume more or less of them. Weight loss is usually a side effect of the Pritikin Eating Plan because of its focus on low calorie-dense foods.  Label Reading  Clinical staff conducted group or individual video education with verbal and written material and guidebook.  Patient learns about the Pritikin recommended label reading guidelines and corresponding recommendations regarding calorie density, added sugars, sodium content, and whole grains.  Dining Out - Part 1  Clinical staff conducted group or individual video education with verbal and written material and guidebook.  Patient learns that restaurant meals can be sabotaging because they can be so high in calories, fat, sodium, and/or sugar. Patient learns recommended strategies on how to positively address this and avoid unhealthy pitfalls.  Facts on Fats  Clinical staff conducted group or individual video education with verbal and written material and guidebook.  Patient learns that lifestyle modifications can be just as effective, if not more so, as many medications for lowering your risk of heart disease. A Pritikin lifestyle can help to reduce your risk of inflammation and atherosclerosis (cholesterol build-up, or plaque, in the artery walls).  Lifestyle interventions such as dietary choices and physical activity address the cause of atherosclerosis. A review of the types of fats and their impact on blood cholesterol levels, along with dietary recommendations to reduce fat intake is also included.  Nutrition Action Plan  Clinical staff conducted group or individual video education with verbal and written material and guidebook.  Patient learns how to incorporate Pritikin recommendations into their lifestyle. Recommendations include planning and keeping personal health goals in mind as an important part of their success.  Healthy Mind-Set    Healthy Minds, Bodies, Hearts  Clinical staff conducted group or individual video education with verbal and written material and guidebook.  Patient learns how to identify when they are stressed. Video will discuss the impact of that stress, as well as the many benefits of stress management. Patient will also be introduced to stress management techniques. The way we think, act, and feel has an impact on our hearts.  How Our Thoughts Can Heal Our Hearts  Clinical staff conducted group or individual video education with verbal and written material and guidebook.  Patient learns that negative thoughts can cause depression and anxiety. This can result in negative lifestyle behavior and serious health problems. Cognitive behavioral therapy is an effective method to help control our thoughts in order to change and improve our emotional outlook.  Additional Videos:  Exercise    Improving Performance  Clinical staff conducted group or individual video education  with verbal and written material and guidebook.  Patient learns to use a non-linear approach by alternating intensity levels and lengths of time spent exercising to help burn more calories and lose more body fat. Cardiovascular exercise helps improve heart health, metabolism, hormonal balance, blood sugar control, and recovery from fatigue. Resistance  training improves strength, endurance, balance, coordination, reaction time, metabolism, and muscle mass. Flexibility exercise improves circulation, posture, and balance. Seek guidance from your physician and exercise physiologist before implementing an exercise routine and learn your capabilities and proper form for all exercise.  Introduction to Yoga  Clinical staff conducted group or individual video education with verbal and written material and guidebook.  Patient learns about yoga, a discipline of the coming together of mind, breath, and body. The benefits of yoga include improved flexibility, improved range of motion, better posture and core strength, increased lung function, weight loss, and positive self-image. Yoga's heart health benefits include lowered blood pressure, healthier heart rate, decreased cholesterol and triglyceride levels, improved immune function, and reduced stress. Seek guidance from your physician and exercise physiologist before implementing an exercise routine and learn your capabilities and proper form for all exercise.  Medical   Aging: Enhancing Your Quality of Life  Clinical staff conducted group or individual video education with verbal and written material and guidebook.  Patient learns key strategies and recommendations to stay in good physical health and enhance quality of life, such as prevention strategies, having an advocate, securing a Health Care Proxy and Power of Attorney, and keeping a list of medications and system for tracking them. It also discusses how to avoid risk for bone loss.  Biology of Weight Control  Clinical staff conducted group or individual video education with verbal and written material and guidebook.  Patient learns that weight gain occurs because we consume more calories than we burn (eating more, moving less). Even if your body weight is normal, you may have higher ratios of fat compared to muscle mass. Too much body fat puts you at  increased risk for cardiovascular disease, heart attack, stroke, type 2 diabetes, and obesity-related cancers. In addition to exercise, following the Pritikin Eating Plan can help reduce your risk.  Decoding Lab Results  Clinical staff conducted group or individual video education with verbal and written material and guidebook.  Patient learns that lab test reflects one measurement whose values change over time and are influenced by many factors, including medication, stress, sleep, exercise, food, hydration, pre-existing medical conditions, and more. It is recommended to use the knowledge from this video to become more involved with your lab results and evaluate your numbers to speak with your doctor.   Diseases of Our Time - Overview  Clinical staff conducted group or individual video education with verbal and written material and guidebook.  Patient learns that according to the CDC, 50% to 70% of chronic diseases (such as obesity, type 2 diabetes, elevated lipids, hypertension, and heart disease) are avoidable through lifestyle improvements including healthier food choices, listening to satiety cues, and increased physical activity.  Sleep Disorders Clinical staff conducted group or individual video education with verbal and written material and guidebook.  Patient learns how good quality and duration of sleep are important to overall health and well-being. Patient also learns about sleep disorders and how they impact health along with recommendations to address them, including discussing with a physician.  Nutrition  Dining Out - Part 2 Clinical staff conducted group or individual video education with verbal and written material and  guidebook.  Patient learns how to plan ahead and communicate in order to maximize their dining experience in a healthy and nutritious manner. Included are recommended food choices based on the type of restaurant the patient is visiting.   Fueling a Fish farm manager conducted group or individual video education with verbal and written material and guidebook.  There is a strong connection between our food choices and our health. Diseases like obesity and type 2 diabetes are very prevalent and are in large-part due to lifestyle choices. The Pritikin Eating Plan provides plenty of food and hunger-curbing satisfaction. It is easy to follow, affordable, and helps reduce health risks.  Menu Workshop  Clinical staff conducted group or individual video education with verbal and written material and guidebook.  Patient learns that restaurant meals can sabotage health goals because they are often packed with calories, fat, sodium, and sugar. Recommendations include strategies to plan ahead and to communicate with the manager, chef, or server to help order a healthier meal.  Planning Your Eating Strategy  Clinical staff conducted group or individual video education with verbal and written material and guidebook.  Patient learns about the Pritikin Eating Plan and its benefit of reducing the risk of disease. The Pritikin Eating Plan does not focus on calories. Instead, it emphasizes high-quality, nutrient-rich foods. By knowing the characteristics of the foods, we choose, we can determine their calorie density and make informed decisions.  Targeting Your Nutrition Priorities  Clinical staff conducted group or individual video education with verbal and written material and guidebook.  Patient learns that lifestyle habits have a tremendous impact on disease risk and progression. This video provides eating and physical activity recommendations based on your personal health goals, such as reducing LDL cholesterol, losing weight, preventing or controlling type 2 diabetes, and reducing high blood pressure.  Vitamins and Minerals  Clinical staff conducted group or individual video education with verbal and written material and guidebook.  Patient learns different  ways to obtain key vitamins and minerals, including through a recommended healthy diet. It is important to discuss all supplements you take with your doctor.   Healthy Mind-Set    Smoking Cessation  Clinical staff conducted group or individual video education with verbal and written material and guidebook.  Patient learns that cigarette smoking and tobacco addiction pose a serious health risk which affects millions of people. Stopping smoking will significantly reduce the risk of heart disease, lung disease, and many forms of cancer. Recommended strategies for quitting are covered, including working with your doctor to develop a successful plan.  Culinary   Becoming a Set designer conducted group or individual video education with verbal and written material and guidebook.  Patient learns that cooking at home can be healthy, cost-effective, quick, and puts them in control. Keys to cooking healthy recipes will include looking at your recipe, assessing your equipment needs, planning ahead, making it simple, choosing cost-effective seasonal ingredients, and limiting the use of added fats, salts, and sugars.  Cooking - Breakfast and Snacks  Clinical staff conducted group or individual video education with verbal and written material and guidebook.  Patient learns how important breakfast is to satiety and nutrition through the entire day. Recommendations include key foods to eat during breakfast to help stabilize blood sugar levels and to prevent overeating at meals later in the day. Planning ahead is also a key component.  Cooking - Educational psychologist conducted group or individual video education  with verbal and written material and guidebook.  Patient learns eating strategies to improve overall health, including an approach to cook more at home. Recommendations include thinking of animal protein as a side on your plate rather than center stage and focusing instead on  lower calorie dense options like vegetables, fruits, whole grains, and plant-based proteins, such as beans. Making sauces in large quantities to freeze for later and leaving the skin on your vegetables are also recommended to maximize your experience.  Cooking - Healthy Salads and Dressing Clinical staff conducted group or individual video education with verbal and written material and guidebook.  Patient learns that vegetables, fruits, whole grains, and legumes are the foundations of the Pritikin Eating Plan. Recommendations include how to incorporate each of these in flavorful and healthy salads, and how to create homemade salad dressings. Proper handling of ingredients is also covered. Cooking - Soups and [employer] - Soups and Desserts Clinical staff conducted group or individual video education with verbal and written material and guidebook.  Patient learns that Pritikin soups and desserts make for easy, nutritious, and delicious snacks and meal components that are low in sodium, fat, sugar, and calorie density, while high in vitamins, minerals, and filling fiber. Recommendations include simple and healthy ideas for soups and desserts.   Overview     The Pritikin Solution Program Overview Clinical staff conducted group or individual video education with verbal and written material and guidebook.  Patient learns that the results of the Pritikin Program have been documented in more than 100 articles published in peer-reviewed journals, and the benefits include reducing risk factors for (and, in some cases, even reversing) high cholesterol, high blood pressure, type 2 diabetes, obesity, and more! An overview of the three key pillars of the Pritikin Program will be covered: eating well, doing regular exercise, and having a healthy mind-set.  WORKSHOPS  Exercise: Exercise Basics: Building Your Action Plan Clinical staff led group instruction and group discussion with PowerPoint presentation  and patient guidebook. To enhance the learning environment the use of posters, models and videos may be added. At the conclusion of this workshop, patients will comprehend the difference between physical activity and exercise, as well as the benefits of incorporating both, into their routine. Patients will understand the FITT (Frequency, Intensity, Time, and Type) principle and how to use it to build an exercise action plan. In addition, safety concerns and other considerations for exercise and cardiac rehab will be addressed by the presenter. The purpose of this lesson is to promote a comprehensive and effective weekly exercise routine in order to improve patients' overall level of fitness.   Managing Heart Disease: Your Path to a Healthier Heart Clinical staff led group instruction and group discussion with PowerPoint presentation and patient guidebook. To enhance the learning environment the use of posters, models and videos may be added.At the conclusion of this workshop, patients will understand the anatomy and physiology of the heart. Additionally, they will understand how Pritikin's three pillars impact the risk factors, the progression, and the management of heart disease.  The purpose of this lesson is to provide a high-level overview of the heart, heart disease, and how the Pritikin lifestyle positively impacts risk factors.  Exercise Biomechanics Clinical staff led group instruction and group discussion with PowerPoint presentation and patient guidebook. To enhance the learning environment the use of posters, models and videos may be added. Patients will learn how the structural parts of their bodies function and how these functions impact  their daily activities, movement, and exercise. Patients will learn how to promote a neutral spine, learn how to manage pain, and identify ways to improve their physical movement in order to promote healthy living. The purpose of this lesson is to  expose patients to common physical limitations that impact physical activity. Participants will learn practical ways to adapt and manage aches and pains, and to minimize their effect on regular exercise. Patients will learn how to maintain good posture while sitting, walking, and lifting.  Balance Training and Fall Prevention  Clinical staff led group instruction and group discussion with PowerPoint presentation and patient guidebook. To enhance the learning environment the use of posters, models and videos may be added. At the conclusion of this workshop, patients will understand the importance of their sensorimotor skills (vision, proprioception, and the vestibular system) in maintaining their ability to balance as they age. Patients will apply a variety of balancing exercises that are appropriate for their current level of function. Patients will understand the common causes for poor balance, possible solutions to these problems, and ways to modify their physical environment in order to minimize their fall risk. The purpose of this lesson is to teach patients about the importance of maintaining balance as they age and ways to minimize their risk of falling.  WORKSHOPS   Nutrition:  Fueling a Ship broker led group instruction and group discussion with PowerPoint presentation and patient guidebook. To enhance the learning environment the use of posters, models and videos may be added. Patients will review the foundational principles of the Pritikin Eating Plan and understand what constitutes a serving size in each of the food groups. Patients will also learn Pritikin-friendly foods that are better choices when away from home and review make-ahead meal and snack options. Calorie density will be reviewed and applied to three nutrition priorities: weight maintenance, weight loss, and weight gain. The purpose of this lesson is to reinforce (in a group setting) the key concepts around  what patients are recommended to eat and how to apply these guidelines when away from home by planning and selecting Pritikin-friendly options. Patients will understand how calorie density may be adjusted for different weight management goals.  Mindful Eating  Clinical staff led group instruction and group discussion with PowerPoint presentation and patient guidebook. To enhance the learning environment the use of posters, models and videos may be added. Patients will briefly review the concepts of the Pritikin Eating Plan and the importance of low-calorie dense foods. The concept of mindful eating will be introduced as well as the importance of paying attention to internal hunger signals. Triggers for non-hunger eating and techniques for dealing with triggers will be explored. The purpose of this lesson is to provide patients with the opportunity to review the basic principles of the Pritikin Eating Plan, discuss the value of eating mindfully and how to measure internal cues of hunger and fullness using the Hunger Scale. Patients will also discuss reasons for non-hunger eating and learn strategies to use for controlling emotional eating.  Targeting Your Nutrition Priorities Clinical staff led group instruction and group discussion with PowerPoint presentation and patient guidebook. To enhance the learning environment the use of posters, models and videos may be added. Patients will learn how to determine their genetic susceptibility to disease by reviewing their family history. Patients will gain insight into the importance of diet as part of an overall healthy lifestyle in mitigating the impact of genetics and other environmental insults. The purpose of this  lesson is to provide patients with the opportunity to assess their personal nutrition priorities by looking at their family history, their own health history and current risk factors. Patients will also be able to discuss ways of prioritizing and  modifying the Pritikin Eating Plan for their highest risk areas  Menu  Clinical staff led group instruction and group discussion with PowerPoint presentation and patient guidebook. To enhance the learning environment the use of posters, models and videos may be added. Using menus brought in from [employer], or printed from [employer], patients will apply the Pritikin dining out guidelines that were presented in the [employer] video. Patients will also be able to practice these guidelines in a variety of provided scenarios. The purpose of this lesson is to provide patients with the opportunity to practice hands-on learning of the Pritikin Dining Out guidelines with actual menus and practice scenarios.  Label Reading Clinical staff led group instruction and group discussion with PowerPoint presentation and patient guidebook. To enhance the learning environment the use of posters, models and videos may be added. Patients will review and discuss the Pritikin label reading guidelines presented in Pritikin's Label Reading Educational series video. Using fool labels brought in from local grocery stores and markets, patients will apply the label reading guidelines and determine if the packaged food meet the Pritikin guidelines. The purpose of this lesson is to provide patients with the opportunity to review, discuss, and practice hands-on learning of the Pritikin Label Reading guidelines with actual packaged food labels. Cooking School  Pritikin's [employer] are designed to teach patients ways to prepare quick, simple, and affordable recipes at home. The importance of nutrition's role in chronic disease risk reduction is reflected in its emphasis in the overall Pritikin program. By learning how to prepare essential core Pritikin Eating Plan recipes, patients will increase control over what they eat; be able to customize the flavor of foods without the use of added salt,  sugar, or fat; and improve the quality of the food they consume. By learning a set of core recipes which are easily assembled, quickly prepared, and affordable, patients are more likely to prepare more healthy foods at home. These workshops focus on convenient breakfasts, simple entres, side dishes, and desserts which can be prepared with minimal effort and are consistent with nutrition recommendations for cardiovascular risk reduction. Cooking [employer] are taught by a Armed forces logistics/support/administrative officer (RD) who has been trained by the [employer]. The chef or RD has a clear understanding of the importance of minimizing - if not completely eliminating - added fat, sugar, and sodium in recipes. Throughout the series of Cooking School Workshop sessions, patients will learn about healthy ingredients and efficient methods of cooking to build confidence in their capability to prepare    Cooking School weekly topics:  Adding Flavor- Sodium-Free  Fast and Healthy Breakfasts  Powerhouse Plant-Based Proteins  Satisfying Salads and Dressings  Simple Sides and Sauces  International Cuisine-Spotlight on the [employer] Zones  Delicious Desserts  Savory Soups  [employer] - Meals in a Astronomer Appetizers and Snacks  Comforting Weekend Breakfasts  One-Pot Wonders   Fast Evening Meals  Landscape architect Your Pritikin Plate  WORKSHOPS   Healthy Mindset (Psychosocial):  Focused Goals, Sustainable Changes Clinical staff led group instruction and group discussion with PowerPoint presentation and patient guidebook. To enhance the learning environment the use of posters, models and videos may be added. Patients will  be able to apply effective goal setting strategies to establish at least one personal goal, and then take consistent, meaningful action toward that goal. They will learn to identify common barriers to achieving personal goals and develop strategies to overcome  them. Patients will also gain an understanding of how our mind-set can impact our ability to achieve goals and the importance of cultivating a positive and growth-oriented mind-set. The purpose of this lesson is to provide patients with a deeper understanding of how to set and achieve personal goals, as well as the tools and strategies needed to overcome common obstacles which may arise along the way.  From Head to Heart: The Power of a Healthy Outlook  Clinical staff led group instruction and group discussion with PowerPoint presentation and patient guidebook. To enhance the learning environment the use of posters, models and videos may be added. Patients will be able to recognize and describe the impact of emotions and mood on physical health. They will discover the importance of self-care and explore self-care practices which may work for them. Patients will also learn how to utilize the 4 C's to cultivate a healthier outlook and better manage stress and challenges. The purpose of this lesson is to demonstrate to patients how a healthy outlook is an essential part of maintaining good health, especially as they continue their cardiac rehab journey.  Healthy Sleep for a Healthy Heart Clinical staff led group instruction and group discussion with PowerPoint presentation and patient guidebook. To enhance the learning environment the use of posters, models and videos may be added. At the conclusion of this workshop, patients will be able to demonstrate knowledge of the importance of sleep to overall health, well-being, and quality of life. They will understand the symptoms of, and treatments for, common sleep disorders. Patients will also be able to identify daytime and nighttime behaviors which impact sleep, and they will be able to apply these tools to help manage sleep-related challenges. The purpose of this lesson is to provide patients with a general overview of sleep and outline the importance of quality  sleep. Patients will learn about a few of the most common sleep disorders. Patients will also be introduced to the concept of "sleep hygiene," and discover ways to self-manage certain sleeping problems through simple daily behavior changes. Finally, the workshop will motivate patients by clarifying the links between quality sleep and their goals of heart-healthy living.   Recognizing and Reducing Stress Clinical staff led group instruction and group discussion with PowerPoint presentation and patient guidebook. To enhance the learning environment the use of posters, models and videos may be added. At the conclusion of this workshop, patients will be able to understand the types of stress reactions, differentiate between acute and chronic stress, and recognize the impact that chronic stress has on their health. They will also be able to apply different coping mechanisms, such as reframing negative self-talk. Patients will have the opportunity to practice a variety of stress management techniques, such as deep abdominal breathing, progressive muscle relaxation, and/or guided imagery.  The purpose of this lesson is to educate patients on the role of stress in their lives and to provide healthy techniques for coping with it.  Learning Barriers/Preferences:  Learning Barriers/Preferences - 08/25/23 0954       Learning Barriers/Preferences   Learning Barriers --          Education Topics:  Knowledge Questionnaire Score:  Knowledge Questionnaire Score - 08/25/23 0845       Knowledge Questionnaire  Score   Pre Score 20/24          Core Components/Risk Factors/Patient Goals at Admission:  Personal Goals and Risk Factors at Admission - 08/25/23 0845       Core Components/Risk Factors/Patient Goals on Admission    Weight Management Weight Maintenance    Diabetes Yes    Intervention Provide education about signs/symptoms and action to take for hypo/hyperglycemia.;Provide education about proper  nutrition, including hydration, and aerobic/resistive exercise prescription along with prescribed medications to achieve blood glucose in normal ranges: Fasting glucose 65-99 mg/dL    Expected Outcomes Short Term: Participant verbalizes understanding of the signs/symptoms and immediate care of hyper/hypoglycemia, proper foot care and importance of medication, aerobic/resistive exercise and nutrition plan for blood glucose control.;Long Term: Attainment of HbA1C < 7%.    Hypertension Yes    Intervention Provide education on lifestyle modifcations including regular physical activity/exercise, weight management, moderate sodium restriction and increased consumption of fresh fruit, vegetables, and low fat dairy, alcohol moderation, and smoking cessation.;Monitor prescription use compliance.    Expected Outcomes Short Term: Continued assessment and intervention until BP is < 140/51mm HG in hypertensive participants. < 130/53mm HG in hypertensive participants with diabetes, heart failure or chronic kidney disease.;Long Term: Maintenance of blood pressure at goal levels.    Lipids Yes    Intervention Provide education and support for participant on nutrition & aerobic/resistive exercise along with prescribed medications to achieve LDL 70mg , HDL >40mg .    Expected Outcomes Short Term: Participant states understanding of desired cholesterol values and is compliant with medications prescribed. Participant is following exercise prescription and nutrition guidelines.;Long Term: Cholesterol controlled with medications as prescribed, with individualized exercise RX and with personalized nutrition plan. Value goals: LDL < 70mg , HDL > 40 mg.          Core Components/Risk Factors/Patient Goals Review:   Goals and Risk Factor Review     Row Name 09/07/23 1734 09/15/23 1653 10/09/23 0943         Core Components/Risk Factors/Patient Goals Review   Personal Goals Review Weight  Management/Obesity;Lipids;Hypertension;Diabetes Weight Management/Obesity;Lipids;Hypertension;Diabetes Weight Management/Obesity;Lipids;Hypertension;Diabetes     Review Shi started cardiac rehab on 09/07/23. Jordie did well with exercise. Post exercise BP was  88/64 asymptomatic. patient left before recheck bP. Will continue to monitor. Eirik started cardiac rehab on 09/07/23. Azim did well with exercise. Resting systolic BP's remains in the 90's. Will conitnue to monitor BP's. Asymptomatic. Vinicius iis doing well with exercise. Resting systolic BP's remains in the 90's. Asymptomatic. Morad's metoprolol  has been discontiued.     Expected Outcomes Hayes will contiunue to participate in caridac rehab for exercise, nutrtion and lifestyle modifications. Menashe will contiunue to participate in caridac rehab for exercise, nutrtion and lifestyle modifications. Juron will contiunue to participate in caridac rehab for exercise, nutrtion and lifestyle modifications.        Core Components/Risk Factors/Patient Goals at Discharge (Final Review):   Goals and Risk Factor Review - 10/09/23 0943       Core Components/Risk Factors/Patient Goals Review   Personal Goals Review Weight Management/Obesity;Lipids;Hypertension;Diabetes    Review Kenon iis doing well with exercise. Resting systolic BP's remains in the 90's. Asymptomatic. Miguel's metoprolol  has been discontiued.    Expected Outcomes Theador will contiunue to participate in caridac rehab for exercise, nutrtion and lifestyle modifications.          ITP Comments:  ITP Comments     Row Name 08/25/23 [phone] 09/07/23 1732 09/15/23 1652 10/09/23 9060  ITP Comments Wilbert Bihari, MD: Medical Director.  Introduction to the [employer] / Intensive Cardiac Rehab.  Initial orienation packet reviewed witht he patient. 30 Day ITP Review. Crystal started cardiac rehab on 09/07/23. Cline did well with exercise. 30 Day ITP Review. Layken started cardiac rehab  on 09/07/23. Oziel is off to a good start with exercise. 30 Day ITP Review. Pranit has good attendance and participation with exercise at cardiac rehab.       Comments: See ITP comments.[name] RN BSN

## 2023-10-12 ENCOUNTER — Encounter (HOSPITAL_COMMUNITY)
Admission: RE | Admit: 2023-10-12 | Discharge: 2023-10-12 | Disposition: A | Discharge status: Home / Self Care | Source: Ambulatory Visit | Attending: Cardiology

## 2023-10-12 DIAGNOSIS — Z48812 Encounter for surgical aftercare following surgery on the circulatory system: Secondary | ICD-10-CM | POA: Diagnosis not present

## 2023-10-12 DIAGNOSIS — Z951 Presence of aortocoronary bypass graft: Secondary | ICD-10-CM | POA: Diagnosis not present

## 2023-10-14 ENCOUNTER — Telehealth (HOSPITAL_COMMUNITY): Payer: Self-pay

## 2023-10-14 ENCOUNTER — Encounter (HOSPITAL_COMMUNITY)
Admission: RE | Admit: 2023-10-14 | Discharge: 2023-10-14 | Disposition: A | Discharge status: Home / Self Care | Source: Ambulatory Visit | Attending: Cardiology | Admitting: Cardiology

## 2023-10-14 DIAGNOSIS — Z48812 Encounter for surgical aftercare following surgery on the circulatory system: Secondary | ICD-10-CM | POA: Diagnosis not present

## 2023-10-14 DIAGNOSIS — Z951 Presence of aortocoronary bypass graft: Secondary | ICD-10-CM | POA: Diagnosis not present

## 2023-10-14 NOTE — Telephone Encounter (Signed)
-----   Message from [name] sent at 10/13/2023  6:15 PM EDT ----- Regarding: RE: THRR increase request Agree to increase [name], MD ----- Message ----- From: Vannie Alec RAMAN Sent: 10/09/2023   5:22 PM EDT To: [name], MD Subject: THRR increase request                          CARDIAC REHAB PHASE 2  Paul Ryan has been in rehab approximately 5 weeks and is doing well.  Blood pressure is a little on the lower side but is doing better and he remains asymptomatic, but exercise heart rates are beginning to exceed Target Heart Rate Range(THRR) of 56-113 bpm (40%-80% of age predicted max HR).  If no GXT is planned for the near future and MD agrees, request to increase THR to 40% -90% of age predicted max HR 56-127 bpm.   Thanks for our assistance,   Alec RAMAN Vannie ACSM-CEP 10/09/2023 5:20 PM

## 2023-10-16 ENCOUNTER — Encounter (HOSPITAL_COMMUNITY)
Admission: RE | Admit: 2023-10-16 | Discharge: 2023-10-16 | Disposition: A | Discharge status: Home / Self Care | Source: Ambulatory Visit | Attending: Cardiology | Admitting: Cardiology

## 2023-10-16 DIAGNOSIS — Z48812 Encounter for surgical aftercare following surgery on the circulatory system: Secondary | ICD-10-CM | POA: Diagnosis not present

## 2023-10-16 DIAGNOSIS — Z951 Presence of aortocoronary bypass graft: Secondary | ICD-10-CM

## 2023-10-19 ENCOUNTER — Encounter (HOSPITAL_COMMUNITY)
Admission: RE | Admit: 2023-10-19 | Discharge: 2023-10-19 | Disposition: A | Discharge status: Home / Self Care | Source: Ambulatory Visit | Attending: Cardiology | Admitting: Cardiology

## 2023-10-19 DIAGNOSIS — Z48812 Encounter for surgical aftercare following surgery on the circulatory system: Secondary | ICD-10-CM | POA: Diagnosis not present

## 2023-10-19 DIAGNOSIS — Z951 Presence of aortocoronary bypass graft: Secondary | ICD-10-CM

## 2023-10-20 DIAGNOSIS — M542 Cervicalgia: Secondary | ICD-10-CM | POA: Diagnosis not present

## 2023-10-21 ENCOUNTER — Encounter (HOSPITAL_COMMUNITY)
Admission: RE | Admit: 2023-10-21 | Discharge: 2023-10-21 | Disposition: A | Discharge status: Home / Self Care | Source: Ambulatory Visit | Attending: Cardiology | Admitting: Cardiology

## 2023-10-21 DIAGNOSIS — Z951 Presence of aortocoronary bypass graft: Secondary | ICD-10-CM | POA: Diagnosis not present

## 2023-10-21 DIAGNOSIS — Z48812 Encounter for surgical aftercare following surgery on the circulatory system: Secondary | ICD-10-CM | POA: Diagnosis not present

## 2023-10-23 ENCOUNTER — Encounter (HOSPITAL_COMMUNITY)
Admission: RE | Admit: 2023-10-23 | Discharge: 2023-10-23 | Disposition: A | Discharge status: Home / Self Care | Source: Ambulatory Visit | Attending: Cardiology | Admitting: Cardiology

## 2023-10-23 DIAGNOSIS — Z951 Presence of aortocoronary bypass graft: Secondary | ICD-10-CM

## 2023-10-23 NOTE — Progress Notes (Signed)
 Upon arrival pt informed cardiac rehab staff that he had a possible syncopal episode night before last (10/21/23) when getting up from his bed to go to his bathroom (awoke on the floor). Wife awoke from sleep upon hearing him hit the floor. 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 Has not yet followed up with PCP since fall, states he has an appt for next week. Taking blood thinners as prescribed.  Provider stated that pt should not exercise today.  Pt informed that he should always present to the ED after hitting his head and losing consciousness to rule out possible brain bleed.  Pt educated that sudden severe headache, nausea and vomiting, confusion and disorientation, loss of consciousness, seizures, weakness or numbness on one side of the body, slurred speech, difficulty swallowing, loss of coordination or balance, and/or changes in behavior or personality could all be symptoms of an intracranial hemorrhage and that if he experiences any of these--or his wife notices any of these about him--for him to present immediately to the ED.  Pt acknowledged understanding of the same.  Pt sent home.

## 2023-10-26 ENCOUNTER — Encounter (HOSPITAL_COMMUNITY): Admission: RE | Admit: 2023-10-26 | Source: Ambulatory Visit

## 2023-10-27 NOTE — Progress Notes (Unsigned)
 Cardiology Office Note:  .   Date:  10/28/2023  ID:  Paul Ryan, DOB 06-Oct-1945, MRN [id number] PCP: Domenica Harlene LABOR, MD  [facility] Providers Cardiologist:  [name], MD {  History of Present Illness: .   Paul Ryan is a 78 y.o. 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  Resting EF 48%.  Stress EF 60% 06/2023 LIMA to LAD, SVG to Diagonal, and SVG to RCA.  MVD on [facility] 06/11/23. LIMA to LAD, SVG to Diagonal, and SVG to RCA. Echo with preserved biventricular function with no significant valvular disease.  Mild dilatation of aortic root 40 mm.  Protein S deficiency History of DVT Lifetime anticoagulation on warfarin  Mild carotid artery stenosis Pre-CABG workup 1 to 39% bilateral ICA  Pulmonary nodule Previous smoker of 20 pack plus year history Followed by pulmonology with stable 7 mm right upper lobe nodule  Social history Walking for 15 to 20 minutes 3-4 times daily. Radiation protection practitioner, previously a professor, volunteers at [employer] and downstairs at [employer].      Patient with history of CAD status post CABG 06/2023.  Postoperatively with no significant complications.  I saw patient last 07/2023 posthospitalization and overall doing well.  Reported mild shortness of breath but not functionally limited.  Today patient presents for 89-month follow-up.  He reports an episode of syncope last week.  Says he was getting out of bed in the middle of the night to use the bathroom and started to have a very sharp/very painful sensation in his leg and feels that he passed out and fell on the floor.  He does not think he was down on the  ground for very long, maybe a second.  Reported no orthostatic positional syncopal episodes previously.  Has not had any episodes like this.  Denied any chest pain, palpitations, shortness of breath.  Otherwise with no acute complaints.  He says he has been very busy and has been working on a proposal for [place] on a new sculpture.  He has been going to cardiac rehab for about 2 months now but this is starting to get in his way of his other hobbies and projects so he is wanting to quit but feels guilty for doing so as he has never quit anything before but it is disruptive in his life.  Continues to exhibit very excellent functional capacity with no exertional symptoms.  ROS: Denies: Chest pain, shortness of breath, orthopnea, peripheral edema, palpitations, decreased exercise intolerance, fatigue, lightheadedness.   Studies Reviewed: .         Risk Assessment/Calculations:             Physical Exam:   VS:  BP 100/72 (BP Location: Left Arm, Patient Position: Sitting, Cuff Size: Normal)   Pulse 99   Ht 5' 11 (1.803 m)   Wt 164 lb (74.4 kg)   SpO2 98%   BMI 22.87 kg/m    Wt Readings from Last 3 Encounters:  10/28/23 164 lb (74.4 kg)  10/08/23 164 lb 9.6 oz (74.7 kg)  09/30/23 166 lb (75.3 kg)    GEN: Well nourished, well developed in no acute distress  NECK: No JVD; No carotid bruits CARDIAC: RRR, no murmurs, rubs, gallops RESPIRATORY:  Clear to auscultation without rales, wheezing or rhonchi  ABDOMEN: Soft, non-tender, non-distended EXTREMITIES:  No edema; No deformity   ASSESSMENT AND PLAN: .    Syncope Suspect this is vasovagal in nature.  Symptoms were preceded by severe pain, isolated episode, happened in the middle the night so likely some element of dehydration.  Technically does not meet criteria for orthostatic hypotension but BP did drop while performing.  Although with history of CABG it is worrisome for cardiogenic causes.  Discussed heart monitor/echo with patient  but he would prefer to monitor and will notify us  if he has another event like this. No significant valvular diseases auscultated and had normal echo recently. In the future would consider echocardiogram and monitor if this happens again. EKG is benign and does not show any significant conduction disease or pauses.  CAD status post CABG x 3 LIMA to LAD, SVG to Diagonal, and SVG to RCA.  Has done extremely well postoperatively.  Has been going through cardiac rehab in which now has been getting in his way of other hobbies/activities.  Defer to cardiac rehab but I think it is reasonable to discontinue given he has done 2 months of this already and has done excellent post rehab with no functional limitations. Continue with aspirin , Repatha , co-Q10, rosuvastatin  10 mg. He was previously on Lopressor  12.5 mg twice daily, I think there was some miscommunication in MyChart messaging when he had asked for refill.  This was discontinued at some point.  BP is soft here so reasonable to continue to hold.   Hyperlipidemia LDL 27 in 09/2023.  Well-controlled, goal less than 55. Continue Repatha  140 mg every 2 weeks, Rosuvastatin  10 mg once daily.    Protein S deficiency History of DVT On lifetime warfarin followed by oncology.    DM 2 A1c 6.9% April 2025.  At some point he was going to be on Jardiance  but this was either never started or discontinued for unclear reasons.   First-degree AV block Heart rates in the 90 here.  This appears to be stable on serial EKGs for many years now.  EKG here does not show any progression.   Mild dilatation of aortic root 40 mm on echocardiogram as above.  Can monitor this annually on echo.  OSA  Reports he is on inspire.  Actually followed by sleep specialist for this and has appointment tomorrow.  Dispo: 16-month follow-up with Dr. Jeffrie.  I have encouraged him to call office again should he have any acute symptoms/complaints.  Signed, Thom LITTIE Sluder, PA-C

## 2023-10-28 ENCOUNTER — Telehealth (HOSPITAL_COMMUNITY): Payer: Self-pay

## 2023-10-28 ENCOUNTER — Ambulatory Visit: Attending: Cardiology | Admitting: Cardiology

## 2023-10-28 ENCOUNTER — Encounter: Payer: Self-pay | Admitting: Nurse Practitioner

## 2023-10-28 ENCOUNTER — Telehealth (HOSPITAL_COMMUNITY): Payer: Self-pay | Admitting: *Deleted

## 2023-10-28 VITALS — BP 115/80 | HR 89 | Ht 71.0 in | Wt 164.0 lb

## 2023-10-28 DIAGNOSIS — E7800 Pure hypercholesterolemia, unspecified: Secondary | ICD-10-CM | POA: Diagnosis not present

## 2023-10-28 DIAGNOSIS — R55 Syncope and collapse: Secondary | ICD-10-CM | POA: Diagnosis not present

## 2023-10-28 DIAGNOSIS — I2510 Atherosclerotic heart disease of native coronary artery without angina pectoris: Secondary | ICD-10-CM | POA: Diagnosis not present

## 2023-10-28 DIAGNOSIS — D6859 Other primary thrombophilia: Secondary | ICD-10-CM | POA: Diagnosis not present

## 2023-10-28 DIAGNOSIS — I2699 Other pulmonary embolism without acute cor pulmonale: Secondary | ICD-10-CM | POA: Diagnosis not present

## 2023-10-28 DIAGNOSIS — E785 Hyperlipidemia, unspecified: Secondary | ICD-10-CM | POA: Diagnosis not present

## 2023-10-28 DIAGNOSIS — Z7901 Long term (current) use of anticoagulants: Secondary | ICD-10-CM | POA: Diagnosis not present

## 2023-10-28 NOTE — Telephone Encounter (Signed)
 Patient left message stating he missed Monday 1:45 CR class due to having a fall over the weekend and he thought it best to wait until his cardiology appt today before coming back to cardiac rehab. Patient will not be in class today as well.

## 2023-10-28 NOTE — Progress Notes (Signed)
 Discharge Progress Report  Patient Details  Name: Paul Ryan MRN: [id number] Date of Birth: 30-Dec-1945 Referring Provider:   Flowsheet Row INTENSIVE CARDIAC REHAB ORIENT from 08/25/2023 in [facility]  Referring Provider Paul Parchment, MD     Number of Visits: 64  Reason for Discharge:  Patient reached a stable level of exercise. Patient independent in their exercise. Patient has met program and personal goals.  Smoking History:  Social History   Tobacco Use  Smoking Status Former   Current packs/day: 0.00   Average packs/day: 1 pack/day for 20.0 years (20.0 ttl pk-yrs)   Types: Cigarettes   Start date: 03/03/1960   Quit date: 03/03/1980   Years since quitting: 43.7  Smokeless Tobacco Never    Diagnosis:  07/03/23 S/P CABG x 3  ADL UCSD:   Initial Exercise Prescription:  Initial Exercise Prescription - 08/25/23 0800       Date of Initial Exercise RX and Referring Provider   Date 08/25/23    Referring Provider Paul Parchment, MD    Expected Discharge Date 11/18/23      Bike   Level 2    Watts 40    Minutes 15    METs 3.7      Recumbant Elliptical   Level 2    RPM 60    Watts 75    Minutes 15    METs 3.7      Prescription Details   Frequency (times per week) 3    Duration Progress to 30 minutes of continuous aerobic without signs/symptoms of physical distress      Intensity   THRR 40-80% of Max Heartrate 56-113    Ratings of Perceived Exertion 11-13    Perceived Dyspnea 0-4      Progression   Progression Continue progressive overload as per policy without signs/symptoms or physical distress.      Resistance Training   Training Prescription Yes    Weight 4 lbs    Reps 10-15          Discharge Exercise Prescription (Final Exercise Prescription Changes):  Exercise Prescription Changes - 10/30/23 1637       Response to Exercise   Blood Pressure (Admit) 98/60    Blood Pressure (Exercise)  128/72    Blood Pressure (Exit) 104/62    Heart Rate (Admit) 79 bpm    Heart Rate (Exercise) 134 bpm    Heart Rate (Exit) 95 bpm    Rating of Perceived Exertion (Exercise) 10    Perceived Dyspnea (Exercise) 0    Comments Pt graduated the [employer] program    Duration Progress to 30 minutes of  aerobic without signs/symptoms of physical distress    Intensity THRR unchanged      Progression   Progression Continue to progress workloads to maintain intensity without signs/symptoms of physical distress.    Average METs 5.5      Resistance Training   Training Prescription Yes    Weight 4 lbs    Reps 10-15    Time 10 Minutes      Bike   Level 4.6    Watts 60    Minutes 15    METs 6.8      Track   Laps 10    Minutes 6    METs 4.67   2236ft     Home Exercise Plan   Plans to continue exercise at Home (comment)    Frequency Add 4 additional  days to program exercise sessions.    Initial Home Exercises Provided 09/23/23          Functional Capacity:  6 Minute Walk     Row Name 08/25/23 0950 10/30/23 1616       6 Minute Walk   Phase Initial Discharge    Distance 1912 feet 2200 feet    Distance Feet Change -- 288 ft    Walk Time 6 minutes 6 minutes    # of Rest Breaks 0 0    MPH 3.62 4.17    METS 3.7 4.67    RPE 11 9    Perceived Dyspnea  0 0    VO2 Peak 12.9 16.35    Symptoms No No    Resting HR 60 bpm 79 bpm    Resting BP 102/64 96/60    Resting Oxygen Saturation  100 % --    Exercise Oxygen Saturation  during 6 min walk 97 % --    Max Ex. HR 98 bpm 134 bpm    Max Ex. BP 116/70 128/72    2 Minute Post BP 108/70 120/68       Psychological, QOL, Others - Outcomes: PHQ 2/9:    10/28/2023    4:22 PM 10/08/2023   11:12 AM 09/02/2023    4:43 PM 08/25/2023    8:09 AM 07/10/2023   12:00 PM  Depression screen PHQ 2/9  Decreased Interest 0 0 0 0 0  Down, Depressed, Hopeless 0 0 0 0 0  PHQ - 2 Score 0 0 0 0 0  Altered sleeping 0 0  0   Tired, decreased energy 0 0   0   Change in appetite 0 0  0   Feeling bad or failure about yourself  0 0  0   Trouble concentrating 0 0  0   Moving slowly or fidgety/restless 0 0  0   Suicidal thoughts 0 0     PHQ-9 Score 0 0  0   Difficult doing work/chores Not difficult at all   Not difficult at all     Quality of Life:  Quality of Life - 10/30/23 1648       Quality of Life   Select Quality of Life      Quality of Life Scores   Health/Function Post 30 %    Socioeconomic Post 30 %    Psych/Spiritual Post 30 %    Family Post 30 %    GLOBAL Post 30 %          Personal Goals: Goals established at orientation with interventions provided to work toward goal.  Personal Goals and Risk Factors at Admission - 08/25/23 0845       Core Components/Risk Factors/Patient Goals on Admission    Weight Management Weight Maintenance    Diabetes Yes    Intervention Provide education about signs/symptoms and action to take for hypo/hyperglycemia.;Provide education about proper nutrition, including hydration, and aerobic/resistive exercise prescription along with prescribed medications to achieve blood glucose in normal ranges: Fasting glucose 65-99 mg/dL    Expected Outcomes Short Term: Participant verbalizes understanding of the signs/symptoms and immediate care of hyper/hypoglycemia, proper foot care and importance of medication, aerobic/resistive exercise and nutrition plan for blood glucose control.;Long Term: Attainment of HbA1C < 7%.    Hypertension Yes    Intervention Provide education on lifestyle modifcations including regular physical activity/exercise, weight management, moderate sodium restriction and increased consumption of fresh fruit, vegetables, and low fat dairy, alcohol  moderation, and smoking cessation.;Monitor prescription use compliance.    Expected Outcomes Short Term: Continued assessment and intervention until BP is < 140/6mm HG in hypertensive participants. < 130/19mm HG in hypertensive  participants with diabetes, heart failure or chronic kidney disease.;Long Term: Maintenance of blood pressure at goal levels.    Lipids Yes    Intervention Provide education and support for participant on nutrition & aerobic/resistive exercise along with prescribed medications to achieve LDL 70mg , HDL >40mg .    Expected Outcomes Short Term: Participant states understanding of desired cholesterol values and is compliant with medications prescribed. Participant is following exercise prescription and nutrition guidelines.;Long Term: Cholesterol controlled with medications as prescribed, with individualized exercise RX and with personalized nutrition plan. Value goals: LDL < 70mg , HDL > 40 mg.           Personal Goals Discharge:  Goals and Risk Factor Review     Row Name 09/07/23 1734 09/15/23 1653 10/09/23 0943 11/03/23 1618       Core Components/Risk Factors/Patient Goals Review   Personal Goals Review Weight Management/Obesity;Lipids;Hypertension;Diabetes Weight Management/Obesity;Lipids;Hypertension;Diabetes Weight Management/Obesity;Lipids;Hypertension;Diabetes Weight Management/Obesity;Lipids;Hypertension;Diabetes    Review Khaleed started cardiac rehab on 09/07/23. Ercell did well with exercise. Post exercise BP was  88/64 asymptomatic. patient left before recheck bP. Will continue to monitor. Cristina started cardiac rehab on 09/07/23. Sukhdeep did well with exercise. Resting systolic BP's remains in the 90's. Will conitnue to monitor BP's. Asymptomatic. Javonte iis doing well with exercise. Resting systolic BP's remains in the 90's. Asymptomatic. Sadiel's metoprolol  has been discontiued. Brendan completed cardiac rehab on 10/30/23.    Expected Outcomes Tymir will contiunue to participate in caridac rehab for exercise, nutrtion and lifestyle modifications. Samay will contiunue to participate in caridac rehab for exercise, nutrtion and lifestyle modifications. Chuckie will contiunue to participate in caridac  rehab for exercise, nutrtion and lifestyle modifications. Eldrick will contiunue to  exercise, follow  nutrtion and lifestyle modifications upon completion of cardiac rehab.       Exercise Goals and Review:  Exercise Goals     Row Name 08/25/23 0809             Exercise Goals   Increase Physical Activity Yes       Intervention Develop an individualized exercise prescription for aerobic and resistive training based on initial evaluation findings, risk stratification, comorbidities and participant's personal goals.;Provide advice, education, support and counseling about physical activity/exercise needs.       Expected Outcomes Short Term: Attend rehab on a regular basis to increase amount of physical activity.;Long Term: Exercising regularly at least 3-5 days a week.;Long Term: Add in home exercise to make exercise part of routine and to increase amount of physical activity.       Increase Strength and Stamina Yes       Intervention Provide advice, education, support and counseling about physical activity/exercise needs.;Develop an individualized exercise prescription for aerobic and resistive training based on initial evaluation findings, risk stratification, comorbidities and participant's personal goals.       Expected Outcomes Short Term: Increase workloads from initial exercise prescription for resistance, speed, and METs.;Short Term: Perform resistance training exercises routinely during rehab and add in resistance training at home;Long Term: Improve cardiorespiratory fitness, muscular endurance and strength as measured by increased METs and functional capacity ( )       Able to understand and use rate of perceived exertion (RPE) scale Yes       Intervention Provide education and explanation on how to use RPE scale  Expected Outcomes Short Term: Able to use RPE daily in rehab to express subjective intensity level;Long Term:  Able to use RPE to guide intensity level when exercising  independently       Knowledge and understanding of Target Heart Rate Range (THRR) Yes       Intervention Provide education and explanation of THRR including how the numbers were predicted and where they are located for reference       Expected Outcomes Short Term: Able to state/look up THRR;Long Term: Able to use THRR to govern intensity when exercising independently;Short Term: Able to use daily as guideline for intensity in rehab       Understanding of Exercise Prescription Yes       Intervention Provide education, explanation, and written materials on patient's individual exercise prescription       Expected Outcomes Short Term: Able to explain program exercise prescription;Long Term: Able to explain home exercise prescription to exercise independently          Exercise Goals Re-Evaluation:  Exercise Goals Re-Evaluation     Row Name 09/07/23 1627 09/23/23 1643 10/30/23 1643         Exercise Goal Re-Evaluation   Exercise Goals Review Increase Physical Activity;Understanding of Exercise Prescription;Increase Strength and Stamina;Knowledge and understanding of Target Heart Rate Range (THRR);Able to understand and use rate of perceived exertion (RPE) scale Increase Physical Activity;Understanding of Exercise Prescription;Increase Strength and Stamina;Knowledge and understanding of Target Heart Rate Range (THRR);Able to understand and use rate of perceived exertion (RPE) scale Increase Physical Activity;Understanding of Exercise Prescription;Increase Strength and Stamina;Knowledge and understanding of Target Heart Rate Range (THRR);Able to understand and use rate of perceived exertion (RPE) scale     Comments Pt first day in the [facility] ICR program. Pt tolerated exercisew ell with an average MET level of 4. Pt is off to a good start and is learning his THRR, RPE and ExRx REVD MET's, goals and home ExRx. Pt tolerated exercise well with an average MET level of 6.35. Pt is doing well with exercise and  is increasing WL, strength and stamina. He is feeling good with his goals and plans to add in weight lifting when his restrictions lift. Until then, he is walking, doing tai chi and squats for 30-45 mins 2-4 days a week. Pt graduated the [employer] today. Pt tolerated exercise well with an average MET level of 6.85. He feels good about his progress and increased his distance by 259ft to a total of 223ft on post . Pt will continue to exercise on his own by walking, Tai Chi, hand weights (reviewed gradual progression today) and [employer] for 5-7 days for 30-60 mins per session.     Expected Outcomes Will continue to monitor pt and progress workloads as tolerated without sign or symptom Will continue to monitor pt and progress workloads as tolerated without sign or symptom Pt will continue to exercise on his own and gain strength.        Nutrition & Weight - Outcomes:   Post Biometrics - 10/30/23 1647        Post  Biometrics   Height 5' 11 (1.803 m)    Weight 73 kg    Waist Circumference 34 inches    Hip Circumference 37.25 inches    Waist to Hip Ratio 0.91 %    BMI (Calculated) 22.46    Triceps Skinfold 9 mm    % Body Fat 20.9 %    Grip Strength 36 kg  Flexibility 13.5 in    Single Leg Stand 11.5 seconds          Nutrition:  Nutrition Therapy & Goals - 10/09/23 1102       Nutrition Therapy   Diet Heart Healthy Diet    Drug/Food Interactions Statins/Certain Fruits;Coumadin /Vit K      Personal Nutrition Goals   Nutrition Goal Patient to identify strategies for reducing cardiovascular risk by attending the [employer] education and nutrition series weekly.   goal in action.   Personal Goal #2 Patient to improve diet quality by using the plate method as a guide for meal planning to include lean protein/plant protein, fruits, vegetables, whole grains, nonfat dairy as part of a well-balanced diet.   goal in progress.   Comments Patient has medical history of CAD, CABGx3,  Protein S deficency (treated with warfarin), DM2, hyperlipidemia. A1c has actually worsed but still remains <7%; jardiance  was stopped at 8/7 PCP follow-up. LDL is at goal (repatha , crestor ). He has maintained his weight since starting with our program. He continues to attend the [employer] education and nutrition series regularly. Patient will benefit from participation in intensive cardiac rehab for nutrition education, exercise, and lifestyle modification      Intervention Plan   Intervention Prescribe, educate and counsel regarding individualized specific dietary modifications aiming towards targeted core components such as weight, hypertension, lipid management, diabetes, heart failure and other comorbidities.;Nutrition handout(s) given to patient.    Expected Outcomes Short Term Goal: Understand basic principles of dietary content, such as calories, fat, sodium, cholesterol and nutrients.;Long Term Goal: Adherence to prescribed nutrition plan.          Nutrition Discharge:  Nutrition Assessments - 10/30/23 1649       Rate Your Plate Scores   Pre Score 87          Education Questionnaire Score:  Knowledge Questionnaire Score - 10/30/23 1650       Knowledge Questionnaire Score   Post Score 22/24         Pt graduates from  Intensive/Traditional cardiac rehab program today with completion of 41 exercise and education sessions. Pt maintained good attendance and progressed nicely during their participation in rehab as evidenced by increased MET level.   Medication list reconciled. Repeat  PHQ score 0 .  Pt has made significant lifestyle changes and should be commended for their success.  Usman achieved his goals during cardiac rehab.   Pt plans to continue exercise at the [facility].  Goals reviewed with patient; copy given to patient.

## 2023-10-28 NOTE — Patient Instructions (Addendum)
 Medication Instructions:  Your physician recommends that you continue on your current medications as directed. Please refer to the Current Medication list given to you today.  *If you need a refill on your cardiac medications before your next appointment, please call your pharmacy*  Lab Work: NONE ordered at this time of appointment   Testing/Procedures: NONE ordered at this time of appointment   Follow-Up: At [facility], you and your health needs are our priority.  As part of our continuing mission to provide you with exceptional heart care, our providers are all part of one team.  This team includes your primary Cardiologist (physician) and Advanced Practice Providers or APPs (Physician Assistants and Nurse Practitioners) who all work together to provide you with the care you need, when you need it.  Your next appointment:   6 month(s)  Provider:   Dorothye Gathers, MD    We recommend signing up for the patient portal called MyChart.  Sign up information is provided on this After Visit Summary.  MyChart is used to connect with patients for Virtual Visits (Telemedicine).  Patients are able to view lab/test results, encounter notes, upcoming appointments, etc.  Non-urgent messages can be sent to your provider as well.   To learn more about what you can do with MyChart, go to [email or web address].

## 2023-10-28 NOTE — Telephone Encounter (Signed)
 Paul Ryan came by to let us  know that he will finish up on Friday to complete cardiac rehab. Paul Ryan plans to exercise at the [facility].[name] RN BSN

## 2023-10-29 ENCOUNTER — Encounter: Payer: Self-pay | Admitting: Neurology

## 2023-10-29 ENCOUNTER — Other Ambulatory Visit: Payer: Self-pay | Admitting: Family Medicine

## 2023-10-29 ENCOUNTER — Ambulatory Visit (INDEPENDENT_AMBULATORY_CARE_PROVIDER_SITE_OTHER): Admitting: Neurology

## 2023-10-29 VITALS — BP 112/66 | HR 66 | Ht 71.0 in | Wt 162.0 lb

## 2023-10-29 DIAGNOSIS — G4752 REM sleep behavior disorder: Secondary | ICD-10-CM | POA: Diagnosis not present

## 2023-10-29 NOTE — Progress Notes (Signed)
 @[facility]   Provider:  Dedra Gores, MD  Primary Care Physician:  Domenica Harlene LABOR, MD [address] [place] [address]  Referring Provider: Margaret Eduard SAUNDERS, Md [address] [place],  [place] [address]  Primary Neurologist : Dr Aquino/ Dr Margaret        Chief Concern for this Consultation:   Patient presents with          HPI: I have the pleasure of meeting with Paul Ryan Ryan, on 10-29-2023,, who is a 78 y.o.  male patient of [place] ancestry,  born in [place],   grew up in [place], seen upon a referral by Dr Margaret  for a  Sleep Medicine Consultation.   The patient's referral information asked for an evaluation of Dream Enactment.  Chief concern according to patient:  I am afraid of hurting someone by my vivid dreams- and acting upon these. This started 6 months ago - it  started after his DULOXETINE   dose was increased, By [employer], is now followed Dr Margaret.   He states that he had a SS completed in [place] around 5 + years ago. OSA was diagnosed ,  He has been wearing a dental device for treatment by Dr Melba for that for the last 5 years, had been previously treated in [place]. .  When he saw Dr Margaret his wife mentioned concerns with him kicking in sleep. His wife has had to wake him up from sleep because he is grabbing her wrist.  He states he was saving her from falling off a cliff in this dream. Very realistic dreams and concerns of hurting wife or dog in sleep while he is dreaming. This started 5 months ago      He presented with a medical history of : most recent CAD  dx after angina attack, [place] surgery 3 vessels, 07-02-2023.   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  NO ENT surgery, no Sleep walking,  has GERD,  has Thyroid  disease ,  Mood disorders : Depression.   This patient had a previous sleep study/ studies in [place] with a resulting diagnosis of OSA treated by dental device.  Dr Melba has followed by [facility] in the past.  Family medical history: There are no  biological family members affected by Sleep apnea ( /), or Insomnia (/) , by excessive daytime sleepiness (/).     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 He lives in a private home, with spouse ( who has cancer one adult daughter in [place]), and has one dog. Nicotine use: remote Hx  , quit 45 years ago  ETOH use: none Caffeine intake in form of: Coffee (rare ), Soft drinks (/), Tea ( mostly hot tea) ,no  Energy drinks ( including those containing  taurine ).  Caffeine is last consumed at 11 AM.  Exercises regularly in form of walking daily - .  Hobbies ; his art ,  Volunteering: he likes to lecture,  ( member of  several groups and engagements).      Sleep habits and routines are as follows: The patient's dinner time is around 6-6.30 PM.  Evening time is spent by reading/ computer . The patient goes to bed at, or close to 11 PM. The bedroom is shared with wife and dog and is described as cool, quiet, and dark.  The patient reports that it takes 10-15 minutes to  fall asleep, then continues to sleep for 8-9 hours, uninterrupted or woken up by the dog's snoring, by the need to void (Nocturia) 2-3 times .   The preferred sleep position is lateral , with support of one pillow, (non- adjustable bed/ recliner ).  The total estimated sleep time is circa 8-9 hours.  Dreams are reportedly very frequent/ and can be vivid. Dream enactment has been reported.  It happened 4 times in the last months.  8 AM is the usual week- day rise time. The patient wakes up spontaneously. [name]  reports mostly feeling refreshed and restored in the morning, waking with : no morning headaches, stiffness , no neck  pain, and fatigue.  No sleep paralysis has been experienced.  Naps in daytime are taken seldomly  (there is no desire to nap and plenty of opportunity),.   Review of Systems: Out of a complete 14 system review, the patient complains of only the following symptoms, and all other reviewed systems are negative.:  Depression/ anxiety: none now Snoring ; not while using dental appliance ,  Nocturia 2-3 times.   How likely are you to doze in the following situations: 0 = not likely, 1 = slight chance, 2 = moderate chance, 3 = high chance Sitting and Reading? Watching Television? Sitting inactive in a public place (theater or meeting)? As a passenger in a car for an hour without a break? Lying down in the afternoon when circumstances permit? Sitting and talking to someone? Sitting quietly after lunch without alcohol? In a car, while stopped for a few minutes in traffic?   Total ESS =3 / 24 points.    FSS endorsed at 15/ 63 points.  GDS: 0/ 15   Worried about his wife's health  Social History   Socioeconomic History   Marital status: Married    Spouse name: Not on file   Number of children: Not on file   Years of education: Not on file   Highest education level: Not on file  Occupational History   Occupation: retired  Tobacco Use   Smoking status: Former     Current packs/day: 0.00    Average packs/day: 1 pack/day for 20.0 years (20.0 ttl pk-yrs)    Types: Cigarettes    Start date: 03/03/1960    Quit date: 03/03/1980    Years since quitting: 43.6   Smokeless tobacco: Never  Vaping Use   Vaping status: Never Used  Substance and Sexual Activity   Alcohol use: No    Alcohol/week: 0.0 standard drinks of alcohol   Drug use: No   Sexual activity: Not on file  Other Topics Concern   Not on file  Social History Narrative   Not on file   Social Drivers of Health   Financial Resource Strain: Low Risk  (10/08/2023)   Overall Financial Resource Strain (CARDIA)    Difficulty of Paying Living Expenses: Not hard at all  Food Insecurity: No Food Insecurity (10/08/2023)   Hunger Vital Sign  Worried About Programme researcher, broadcasting/film/video in the Last Year: Never true    Ran Out of Food in the Last Year: Never true  Transportation Needs: No Transportation Needs (10/08/2023)   PRAPARE - Administrator, Civil Service (Medical): No    Lack of Transportation (Non-Medical): No  Physical Activity: Sufficiently Active (10/08/2023)   Exercise Vital Sign    Days of Exercise per Week: 7 days    Minutes of Exercise per Session: 50 min  Stress: No Stress Concern Present (10/08/2023)   [employer] of Occupational Health - Occupational Stress Questionnaire    Feeling of Stress: Only a little  Social Connections: Unknown (10/08/2023)   Social Connection and Isolation Panel    Frequency of Communication with Friends and Family: More than three times a week    Frequency of Social Gatherings with Friends and Family: Three times a week    Attends Religious Services: Never    Active Member of Clubs or Organizations: Not on file    Attends Banker Meetings: Not on file    Marital Status: Married    Family History  Problem Relation Age of Onset   Cancer Mother        unknown- everywhere   Cancer Sister        breast    Past Medical History:  Diagnosis  Date   Acid reflux 02/03/2016   Allergy    seasonal   Chronic anticoagulation 06/14/2013   Coagulopathy ([name]) 05/16/2011   Collagen vascular disease ([name])    Coronary artery disease    Cough 12/01/2016   Diabetes mellitus without complication ([name])    Type 2   DVT (deep venous thrombosis) ([name])    takes coumadin    Dysuria 02/27/2013   Erectile dysfunction 12/28/2016   Hereditary protein S deficiency ([name]) 05/16/2011   Hyperglycemia 12/26/2014   Hyperlipidemia    Hypothyroid 06/10/2011   Insomnia 04/24/2016   Left hip pain 12/17/2010   MCI (mild cognitive impairment) 07/22/2016   Nasal lesion 07/22/2016   Peripheral vascular disease ([name])    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   Polymyalgia ([name]) 03/21/2014   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   Preventative health care 12/17/2010   Shingles 1982   right abdominal wall   Sleep apnea    Staph skin infection 07/22/2016   Thyroid  nodule 11/29/2014   Urinary hesitancy 09/30/2016   Vitamin D  deficiency 10/14/2011    Past Surgical History:  Procedure Laterality Date   CATARACT EXTRACTION Bilateral    COLONOSCOPY     CORONARY ARTERY BYPASS GRAFT N/A 07/03/2023   Procedure: CORONARY ARTERY BYPASS GRAFTING X 3, USING LEFT INTERNAL MAMMARY ARTERY AND ENDOSCOPIC VEIN HARVEST RIGHT GREATER SAPHENOUS VEIN;  Surgeon: Lucas Dorise POUR, MD;  Location: [facility] OR;  Service: Open Heart Surgery;  Laterality: N/A;   INTRAOPERATIVE TRANSESOPHAGEAL ECHOCARDIOGRAM N/A 07/03/2023   Procedure: ECHOCARDIOGRAM, TRANSESOPHAGEAL, INTRAOPERATIVE;  Surgeon: Lucas Dorise POUR, MD;  Location: [facility] OR;  Service: Open Heart Surgery;  Laterality: N/A;   LEFT HEART CATH AND CORONARY ANGIOGRAPHY N/A 06/11/2023   Procedure: LEFT HEART CATH AND CORONARY ANGIOGRAPHY;  Surgeon: Wendel Lurena POUR, MD;  Location: [facility] INVASIVE [facility];  Service: Cardiovascular;  Laterality: N/A;   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     ALPHA LIPOIC ACID PO Take 1 capsule by mouth daily.     aspirin  EC 81 MG tablet Take 1 tablet (81 mg total) by mouth daily. Swallow whole.     Blood Glucose Monitoring Suppl (ONETOUCH VERIO FLEX SYSTEM) w/Device KIT Use to check blood sugar once a day. DX E11.9 1 kit 0   cetirizine  (ZYRTEC ) 10 MG tablet Take 10 mg by mouth at bedtime.     Cholecalciferol (VITAMIN D -3 PO) Take 1 capsule by mouth daily.     Coenzyme Q10 (COQ10) 100 MG CAPS Take 1 capsule by mouth daily.     DULoxetine  (CYMBALTA ) 30 MG capsule Take 1 capsule (30 mg total) by mouth at bedtime. 90 capsule 4   DULoxetine  (CYMBALTA ) 60 MG capsule Take 1 capsule (60 mg total) by mouth in the morning. 90 capsule 4   Evolocumab  (REPATHA  SURECLICK) 140 MG/ML SOAJ Inject 140 mg into the skin every 14 (fourteen) days. 6 mL 3   famotidine  (PEPCID ) 40 MG tablet Take 40 mg by mouth daily.     MAGNESIUM  PO Take 1 capsule by mouth daily.     Misc Natural Products (MAGIC MUSHROOM MIX) CAPS Take 1 capsule by mouth daily. 5-in-1 Mushroom Complex     pantoprazole  (PROTONIX ) 40 MG tablet TAKE 1 TABLET BY MOUTH EVERY DAY 90 tablet 1   rosuvastatin  (CRESTOR ) 10 MG tablet Take 1 tablet (10 mg total) by mouth every evening.     thyroid  (ARMOUR THYROID ) 60 MG tablet TAKE ONE TABLET BY MOUTH ON MONDAY, WEDNESDAY, THURSDAY, FRIDAY, AND SUNDAY. 65 tablet 1   tiZANidine  (ZANAFLEX ) 2 MG tablet Take 0.5-2 tablets (1-4 mg total) by mouth 2 (two) times daily as needed for muscle spasms. 30 tablet 1   warfarin (COUMADIN ) 4 MG tablet Take 0.5-1 tablets (2-4 mg total) by mouth See admin instructions. 4 mg on Sun Mon Wed Fri and 2 mg Tues Thurs  and Sat 90 tablet 1   zinc gluconate 50 MG tablet Take 50 mg by mouth daily.     No current facility-administered medications on file prior to visit.    No Known Allergies  Vitals:   10/29/23 1008  BP: 112/66  Pulse: 66     Physical exam:   General: The patient was alert and appears not in acute distress.  Mood and affect are appropriate .  The patient's interactions are: Cooperative, makes eye contact, follows the instructions and answers questions coherently.  The patient is groomed and appropriately groomed and dressed. Head: Normocephalic, atraumatic.  Neck is supple. Mallampati: 2.  The neck circumference measured 15 inches. Nasal airflow was patent ,   Overbite / Retrognathia was noted.  Dental status: biological  Cardiovascular:  Regular rate and cardiac rhythm by palpable pulse. Respiratory: no audible wheezing, no tachypnoea.   Skin:  Without evidence of ankle edema. No discoloration.  Trunk:  BMI ; 22.6  The patient's posture was erect.   Neurologic exam : The patient was awake and alert, oriented to place and time.   Attention span & concentration ability appeared normal.  Speech was fluent, without dysarthria, dysphonia or aphasia, and of normal volume.     Cranial nerves:  There was no loss of smell or taste reported  Pupils are round, equal in size and briskly reactive to light.  Funduscopic exam was deferred..  Extraocular movements in vertical and horizontal planes were intact and without nystagmus. (No Diplopia reported). Visual fields by finger perimetry are intact. Hearing was intact to soft voice.    Facial sensation intact to fine touch.  Facial motor strength: Symmetric movement and tongue and uvula move midline.  Neck ROM: rotation, tilt and flexion extension were intact for age and shoulder shrug was symmetrical.    Motor exam:  Symmetric bulk, strength and ROM.   Normal tone without cog- wheeling, and symmetric grip strength.   Sensory:   intact   Coordination: The patient reported no problems with button closure and no changes to penmanship.    He has action  tremor,  bilaterally  The Finger-to-nose maneuver was intact without evidence of ataxia, dysmetria or resting  tremor.   Gait and station: Patient could rise unassisted from a seated position, without bracing, and walked without assistive device.  The patient's gait posture was erect   Deep tendon reflexes: Upper extremities did show symmetric DTRs.   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  3) No history of PTSD, not a sleep walker, no RLS history.  4)  familiar tremor  ( essential )  5 ) chronic anticoagulation for protein S deficiency . 6 ) CAD with bypass surgery 07-02-2023, but dream enactment preceded this.    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     I plan to follow up prn personally or through our NP within 4-5 months if the attended sleep study showed REM BD.     I would like to thank Margaret Eduard SAUNDERS, Md [address] [place],  [place] [address] for this referral,    A total time of  40  minutes consistent of a part of face to face encounter , exam and interview,  and additional preparation time for chart review was spent .  At today's visit, we discussed treatment options, associated risk and benefits, and engage in counseling as needed including, but not limited to:  Sleep hygiene, Quality Sleep Habits, and Safety concerns for patients with daytime sleepiness who are warned to not operate machinery/ motor vehicles when drowsy. Additionally, the following were reviewed: Past medical records, past medical and surgical history, family and social background, as well as relevant laboratory results, imaging findings, and medical  notes, where applicable.  This note was generated by myself in part by using dictation software, and as a result, it may contain unintentional typos and errors.  Nevertheless, effort was made to accurately convey the pertinent aspects of the patient's visit.   Dedra Gores, MD  [facility] Sleep Board certified in Sleep Medicine by The [employer] of Sleep Medicine and Diplomate of the [employer] of [employer] (AASM) . Board certified In Neurology, Diplomat of the ABPN,  Fellow of the [employer] of Neu[employer].

## 2023-10-29 NOTE — Patient Instructions (Signed)
   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  3) No history of PTSD, not a sleep walker, no RLS history.  4)  familiar tremor  ( essential )  5 ) chronic anticoagulation for protein S deficiency . 6 ) CAD with bypass surgery 07-02-2023, but dream enactment preceded this.    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     I plan to follow up prn personally or through our NP within 4-5 months if the attended sleep study showed REM BD.     I would like to thank Margaret Eduard SAUNDERS, Md [address] [place],  [place] [address] for this referral,

## 2023-10-30 ENCOUNTER — Encounter (HOSPITAL_COMMUNITY)
Admission: RE | Admit: 2023-10-30 | Discharge: 2023-10-30 | Disposition: A | Discharge status: Home / Self Care | Source: Ambulatory Visit | Attending: Cardiology | Admitting: Cardiology

## 2023-10-30 ENCOUNTER — Encounter: Payer: Self-pay | Admitting: Oncology

## 2023-10-30 VITALS — Ht 71.0 in | Wt 160.9 lb

## 2023-10-30 DIAGNOSIS — Z951 Presence of aortocoronary bypass graft: Secondary | ICD-10-CM

## 2023-10-30 DIAGNOSIS — Z48812 Encounter for surgical aftercare following surgery on the circulatory system: Secondary | ICD-10-CM | POA: Diagnosis not present

## 2023-10-30 NOTE — Progress Notes (Signed)
 Discharge Progress Report  Patient Details  Name: Drayden Lukas MRN: [id number] Date of Birth: [birth year] Referring Provider:   Flowsheet Row INTENSIVE CARDIAC REHAB ORIENT from 08/25/2023 in [facility]  Referring Provider [name], MD     Number of Visits: 22  Reason for Discharge:  Patient reached a stable level of exercise. Patient independent in their exercise. Patient has met program and personal goals.  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  ADL UCSD:   Initial Exercise Prescription:  Initial Exercise Prescription - 08/25/23 0800       Date of Initial Exercise RX and Referring Provider   Date 08/25/23    Referring Provider [name], MD    Expected Discharge Date 11/18/23      Bike   Level 2    Watts 40    Minutes 15    METs 3.7      Recumbant Elliptical   Level 2    RPM 60    Watts 75    Minutes 15    METs 3.7      Prescription Details   Frequency (times per week) 3    Duration Progress to 30 minutes of continuous aerobic without signs/symptoms of physical distress      Intensity   THRR 40-80% of Max Heartrate 56-113    Ratings of Perceived Exertion 11-13    Perceived Dyspnea 0-4      Progression   Progression Continue progressive overload as per policy without signs/symptoms or physical distress.      Resistance Training   Training Prescription Yes    Weight 4 lbs    Reps 10-15          Discharge Exercise Prescription (Final Exercise Prescription Changes):  Exercise Prescription Changes - 10/30/23 1637       Response to Exercise   Blood Pressure (Admit) 98/60    Blood Pressure (Exercise)  128/72    Blood Pressure (Exit) 104/62    Heart Rate (Admit) 79 bpm    Heart Rate (Exercise) 134 bpm    Heart Rate (Exit) 95 bpm    Rating of Perceived Exertion (Exercise) 10    Perceived Dyspnea (Exercise) 0    Comments Pt graduated the [employer] program    Duration Progress to 30 minutes of  aerobic without signs/symptoms of physical distress    Intensity THRR unchanged      Progression   Progression Continue to progress workloads to maintain intensity without signs/symptoms of physical distress.    Average METs 5.5      Resistance Training   Training Prescription Yes    Weight 4 lbs    Reps 10-15    Time 10 Minutes      Bike   Level 4.6    Watts 60    Minutes 15    METs 6.8      Track   Laps 10    Minutes 6    METs 4.67   2240ft     Home Exercise Plan   Plans to continue exercise at Home (comment)    Frequency Add 4 additional  days to program exercise sessions.    Initial Home Exercises Provided 09/23/23          Functional Capacity:  6 Minute Walk     Row Name 08/25/23 0950 10/30/23 1616       6 Minute Walk   Phase Initial Discharge    Distance 1912 feet 2200 feet    Distance Feet Change -- 288 ft    Walk Time 6 minutes 6 minutes    # of Rest Breaks 0 0    MPH 3.62 4.17    METS 3.7 4.67    RPE 11 9    Perceived Dyspnea  0 0    VO2 Peak 12.9 16.35    Symptoms No No    Resting HR 60 bpm 79 bpm    Resting BP 102/64 96/60    Resting Oxygen Saturation  100 % --    Exercise Oxygen Saturation  during 6 min walk 97 % --    Max Ex. HR 98 bpm 134 bpm    Max Ex. BP 116/70 128/72    2 Minute Post BP 108/70 120/68       Psychological, QOL, Others - Outcomes: PHQ 2/9:    10/28/2023    4:22 PM 10/08/2023   11:12 AM 09/02/2023    4:43 PM 08/25/2023    8:09 AM 07/10/2023   12:00 PM  Depression screen PHQ 2/9  Decreased Interest 0 0 0 0 0  Down, Depressed, Hopeless 0 0 0 0 0  PHQ - 2 Score 0 0 0 0 0  Altered sleeping 0 0  0   Tired, decreased energy 0 0   0   Change in appetite 0 0  0   Feeling bad or failure about yourself  0 0  0   Trouble concentrating 0 0  0   Moving slowly or fidgety/restless 0 0  0   Suicidal thoughts 0 0     PHQ-9 Score 0 0  0   Difficult doing work/chores Not difficult at all   Not difficult at all     Quality of Life:  Quality of Life - 10/30/23 1648       Quality of Life   Select Quality of Life      Quality of Life Scores   Health/Function Post 30 %    Socioeconomic Post 30 %    Psych/Spiritual Post 30 %    Family Post 30 %    GLOBAL Post 30 %          Personal Goals: Goals established at orientation with interventions provided to work toward goal.  Personal Goals and Risk Factors at Admission - 08/25/23 0845       Core Components/Risk Factors/Patient Goals on Admission    Weight Management Weight Maintenance    Diabetes Yes    Intervention Provide education about signs/symptoms and action to take for hypo/hyperglycemia.;Provide education about proper nutrition, including hydration, and aerobic/resistive exercise prescription along with prescribed medications to achieve blood glucose in normal ranges: Fasting glucose 65-99 mg/dL    Expected Outcomes Short Term: Participant verbalizes understanding of the signs/symptoms and immediate care of hyper/hypoglycemia, proper foot care and importance of medication, aerobic/resistive exercise and nutrition plan for blood glucose control.;Long Term: Attainment of HbA1C < 7%.    Hypertension Yes    Intervention Provide education on lifestyle modifcations including regular physical activity/exercise, weight management, moderate sodium restriction and increased consumption of fresh fruit, vegetables, and low fat dairy, alcohol  moderation, and smoking cessation.;Monitor prescription use compliance.    Expected Outcomes Short Term: Continued assessment and intervention until BP is < 140/5mm HG in hypertensive participants. < 130/73mm HG in hypertensive  participants with diabetes, heart failure or chronic kidney disease.;Long Term: Maintenance of blood pressure at goal levels.    Lipids Yes    Intervention Provide education and support for participant on nutrition & aerobic/resistive exercise along with prescribed medications to achieve LDL 70mg , HDL >40mg .    Expected Outcomes Short Term: Participant states understanding of desired cholesterol values and is compliant with medications prescribed. Participant is following exercise prescription and nutrition guidelines.;Long Term: Cholesterol controlled with medications as prescribed, with individualized exercise RX and with personalized nutrition plan. Value goals: LDL < 70mg , HDL > 40 mg.           Personal Goals Discharge:  Goals and Risk Factor Review     Row Name 09/07/23 1734 09/15/23 1653 10/09/23 0943 11/03/23 1618       Core Components/Risk Factors/Patient Goals Review   Personal Goals Review Weight Management/Obesity;Lipids;Hypertension;Diabetes Weight Management/Obesity;Lipids;Hypertension;Diabetes Weight Management/Obesity;Lipids;Hypertension;Diabetes Weight Management/Obesity;Lipids;Hypertension;Diabetes    Review Dajohn started cardiac rehab on 09/07/23. Filmore did well with exercise. Post exercise BP was  88/64 asymptomatic. patient left before recheck bP. Will continue to monitor. Eusevio started cardiac rehab on 09/07/23. Desean did well with exercise. Resting systolic BP's remains in the 90's. Will conitnue to monitor BP's. Asymptomatic. Corban iis doing well with exercise. Resting systolic BP's remains in the 90's. Asymptomatic. Geremiah's metoprolol  has been discontiued. Bessie completed cardiac rehab on 10/30/23.    Expected Outcomes Ethen will contiunue to participate in caridac rehab for exercise, nutrtion and lifestyle modifications. Abram will contiunue to participate in caridac rehab for exercise, nutrtion and lifestyle modifications. Raistlin will contiunue to participate in caridac  rehab for exercise, nutrtion and lifestyle modifications. Bear will contiunue to  exercise, follow  nutrtion and lifestyle modifications upon completion of cardiac rehab.       Exercise Goals and Review:  Exercise Goals     Row Name 08/25/23 0809             Exercise Goals   Increase Physical Activity Yes       Intervention Develop an individualized exercise prescription for aerobic and resistive training based on initial evaluation findings, risk stratification, comorbidities and participant's personal goals.;Provide advice, education, support and counseling about physical activity/exercise needs.       Expected Outcomes Short Term: Attend rehab on a regular basis to increase amount of physical activity.;Long Term: Exercising regularly at least 3-5 days a week.;Long Term: Add in home exercise to make exercise part of routine and to increase amount of physical activity.       Increase Strength and Stamina Yes       Intervention Provide advice, education, support and counseling about physical activity/exercise needs.;Develop an individualized exercise prescription for aerobic and resistive training based on initial evaluation findings, risk stratification, comorbidities and participant's personal goals.       Expected Outcomes Short Term: Increase workloads from initial exercise prescription for resistance, speed, and METs.;Short Term: Perform resistance training exercises routinely during rehab and add in resistance training at home;Long Term: Improve cardiorespiratory fitness, muscular endurance and strength as measured by increased METs and functional capacity ( )       Able to understand and use rate of perceived exertion (RPE) scale Yes       Intervention Provide education and explanation on how to use RPE scale  Expected Outcomes Short Term: Able to use RPE daily in rehab to express subjective intensity level;Long Term:  Able to use RPE to guide intensity level when exercising  independently       Knowledge and understanding of Target Heart Rate Range (THRR) Yes       Intervention Provide education and explanation of THRR including how the numbers were predicted and where they are located for reference       Expected Outcomes Short Term: Able to state/look up THRR;Long Term: Able to use THRR to govern intensity when exercising independently;Short Term: Able to use daily as guideline for intensity in rehab       Understanding of Exercise Prescription Yes       Intervention Provide education, explanation, and written materials on patient's individual exercise prescription       Expected Outcomes Short Term: Able to explain program exercise prescription;Long Term: Able to explain home exercise prescription to exercise independently          Exercise Goals Re-Evaluation:  Exercise Goals Re-Evaluation     Row Name 09/07/23 1627 09/23/23 1643 10/30/23 1643         Exercise Goal Re-Evaluation   Exercise Goals Review Increase Physical Activity;Understanding of Exercise Prescription;Increase Strength and Stamina;Knowledge and understanding of Target Heart Rate Range (THRR);Able to understand and use rate of perceived exertion (RPE) scale Increase Physical Activity;Understanding of Exercise Prescription;Increase Strength and Stamina;Knowledge and understanding of Target Heart Rate Range (THRR);Able to understand and use rate of perceived exertion (RPE) scale Increase Physical Activity;Understanding of Exercise Prescription;Increase Strength and Stamina;Knowledge and understanding of Target Heart Rate Range (THRR);Able to understand and use rate of perceived exertion (RPE) scale     Comments Pt first day in the [facility] ICR program. Pt tolerated exercisew ell with an average MET level of 4. Pt is off to a good start and is learning his THRR, RPE and ExRx REVD MET's, goals and home ExRx. Pt tolerated exercise well with an average MET level of 6.35. Pt is doing well with exercise and  is increasing WL, strength and stamina. He is feeling good with his goals and plans to add in weight lifting when his restrictions lift. Until then, he is walking, doing tai chi and squats for 30-45 mins 2-4 days a week. Pt graduated the [employer] today. Pt tolerated exercise well with an average MET level of 6.85. He feels good about his progress and increased his distance by 270ft to a total of 2216ft on post . Pt will continue to exercise on his own by walking, Tai Chi, hand weights (reviewed gradual progression today) and [employer] for 5-7 days for 30-60 mins per session.     Expected Outcomes Will continue to monitor pt and progress workloads as tolerated without sign or symptom Will continue to monitor pt and progress workloads as tolerated without sign or symptom Pt will continue to exercise on his own and gain strength.        Nutrition & Weight - Outcomes:   Post Biometrics - 10/30/23 1647        Post  Biometrics   Height 5' 11 (1.803 m)    Weight 73 kg    Waist Circumference 34 inches    Hip Circumference 37.25 inches    Waist to Hip Ratio 0.91 %    BMI (Calculated) 22.46    Triceps Skinfold 9 mm    % Body Fat 20.9 %    Grip Strength 36 kg  Flexibility 13.5 in    Single Leg Stand 11.5 seconds          Nutrition:  Nutrition Therapy & Goals - 10/09/23 1102       Nutrition Therapy   Diet Heart Healthy Diet    Drug/Food Interactions Statins/Certain Fruits;Coumadin /Vit K      Personal Nutrition Goals   Nutrition Goal Patient to identify strategies for reducing cardiovascular risk by attending the [employer] education and nutrition series weekly.   goal in action.   Personal Goal #2 Patient to improve diet quality by using the plate method as a guide for meal planning to include lean protein/plant protein, fruits, vegetables, whole grains, nonfat dairy as part of a well-balanced diet.   goal in progress.   Comments Patient has medical history of CAD, CABGx3,  Protein S deficency (treated with warfarin), DM2, hyperlipidemia. A1c has actually worsed but still remains <7%; jardiance  was stopped at 8/7 PCP follow-up. LDL is at goal (repatha , crestor ). He has maintained his weight since starting with our program. He continues to attend the [employer] education and nutrition series regularly. Patient will benefit from participation in intensive cardiac rehab for nutrition education, exercise, and lifestyle modification      Intervention Plan   Intervention Prescribe, educate and counsel regarding individualized specific dietary modifications aiming towards targeted core components such as weight, hypertension, lipid management, diabetes, heart failure and other comorbidities.;Nutrition handout(s) given to patient.    Expected Outcomes Short Term Goal: Understand basic principles of dietary content, such as calories, fat, sodium, cholesterol and nutrients.;Long Term Goal: Adherence to prescribed nutrition plan.          Nutrition Discharge:  Nutrition Assessments - 10/30/23 1649       Rate Your Plate Scores   Pre Score 87          Education Questionnaire Score:  Knowledge Questionnaire Score - 10/30/23 1650       Knowledge Questionnaire Score   Post Score 22/24          Goals reviewed with patient; copy given to patient.Pt graduates from  Intensive/Traditional cardiac rehab program 10/30/23 with completion of  41 exercise and education sessions. Pt maintained good attendance and progressed nicely during their participation in rehab as evidenced by increased MET level. Jaryn increased his distance on his post exercise walk test by 288 feet. Ferlin lost 2.8 kg while enrolled in  cardiac rehab.  Medication list reconciled. Repeat  PHQ score- 0 .  Pt has made significant lifestyle changes and should be commended for his success. Khaliq  achieved their goals during cardiac rehab.   Pt plans to continue exercise at the [facility] via silver sneakers. We are  proud of Zinedine's progress![name] RN BSN

## 2023-11-04 ENCOUNTER — Telehealth: Payer: Self-pay | Admitting: Neurology

## 2023-11-04 ENCOUNTER — Encounter (HOSPITAL_COMMUNITY)

## 2023-11-04 NOTE — Telephone Encounter (Signed)
 NPSG [employer] pending

## 2023-11-06 ENCOUNTER — Encounter (HOSPITAL_COMMUNITY)

## 2023-11-07 DIAGNOSIS — H5789 Other specified disorders of eye and adnexa: Secondary | ICD-10-CM | POA: Diagnosis not present

## 2023-11-09 ENCOUNTER — Encounter (HOSPITAL_COMMUNITY)

## 2023-11-09 NOTE — Telephone Encounter (Signed)
 NPSG Mylene barrows: [phone] (exp. 11/04/23 to 01/29/24)

## 2023-11-10 DIAGNOSIS — M542 Cervicalgia: Secondary | ICD-10-CM | POA: Diagnosis not present

## 2023-11-11 ENCOUNTER — Encounter (HOSPITAL_COMMUNITY)

## 2023-11-13 ENCOUNTER — Other Ambulatory Visit: Payer: Self-pay | Admitting: Family Medicine

## 2023-11-13 ENCOUNTER — Encounter (HOSPITAL_COMMUNITY)

## 2023-11-16 ENCOUNTER — Encounter (HOSPITAL_COMMUNITY)

## 2023-11-16 NOTE — Telephone Encounter (Signed)
 I called the patient he did not pick up I left a voicemail w/my direct number on. I also sent him a mychart.

## 2023-11-18 ENCOUNTER — Encounter (HOSPITAL_COMMUNITY)

## 2023-11-18 NOTE — Telephone Encounter (Signed)
 I spoke with the patient.   NPSG Paul Ryan: [phone] (exp. 11/04/23 to 01/29/24)  Patient is scheduled at [facility] for 01/25/24 at 8 pm.  [place] chose that [place].

## 2023-12-01 ENCOUNTER — Encounter

## 2023-12-15 ENCOUNTER — Encounter: Payer: Self-pay | Admitting: Plastic Surgery

## 2023-12-15 ENCOUNTER — Ambulatory Visit (INDEPENDENT_AMBULATORY_CARE_PROVIDER_SITE_OTHER): Payer: Self-pay | Admitting: Plastic Surgery

## 2023-12-15 DIAGNOSIS — Z719 Counseling, unspecified: Secondary | ICD-10-CM

## 2023-12-15 NOTE — Progress Notes (Signed)
 Preoperative Dx: hyperpigmentation of face  Postoperative Dx:  same  Procedure: laser to face   Anesthesia: none  Description of Procedure:  Risks and complications were explained to the patient. Consent was confirmed and signed. Eye protection was placed. Time out was called and all information was confirmed to be correct. The area  area was prepped with alcohol and wiped dry. The heroic laser was set at 590 nm and 560 nm at preset J/cm2. The face was lasered. The patient tolerated the procedure well and there were no complications. The patient is to follow up in 4 weeks.

## 2023-12-23 ENCOUNTER — Other Ambulatory Visit: Payer: Self-pay | Admitting: Oncology

## 2023-12-23 DIAGNOSIS — I82509 Chronic embolism and thrombosis of unspecified deep veins of unspecified lower extremity: Secondary | ICD-10-CM

## 2024-01-20 DIAGNOSIS — I2699 Other pulmonary embolism without acute cor pulmonale: Secondary | ICD-10-CM | POA: Diagnosis not present

## 2024-01-20 DIAGNOSIS — D6859 Other primary thrombophilia: Secondary | ICD-10-CM | POA: Diagnosis not present

## 2024-01-20 DIAGNOSIS — Z7901 Long term (current) use of anticoagulants: Secondary | ICD-10-CM | POA: Diagnosis not present

## 2024-01-25 ENCOUNTER — Encounter: Payer: Self-pay | Admitting: *Deleted

## 2024-01-25 ENCOUNTER — Ambulatory Visit: Admitting: Neurology

## 2024-01-25 DIAGNOSIS — G4737 Central sleep apnea in conditions classified elsewhere: Secondary | ICD-10-CM

## 2024-01-25 DIAGNOSIS — G4730 Sleep apnea, unspecified: Secondary | ICD-10-CM

## 2024-01-25 DIAGNOSIS — G4752 REM sleep behavior disorder: Secondary | ICD-10-CM | POA: Diagnosis not present

## 2024-01-25 DIAGNOSIS — Z8669 Personal history of other diseases of the nervous system and sense organs: Secondary | ICD-10-CM

## 2024-02-01 ENCOUNTER — Telehealth: Payer: Self-pay | Admitting: Diagnostic Neuroimaging

## 2024-02-01 ENCOUNTER — Encounter: Payer: Self-pay | Admitting: Cardiology

## 2024-02-01 ENCOUNTER — Encounter: Payer: Self-pay | Admitting: Diagnostic Neuroimaging

## 2024-02-01 NOTE — Telephone Encounter (Signed)
 Pt called to request to speak to Md about Sleep study Result,  Informed Pt MD has not yet reviewed results once reviewed  Pt will get a call.

## 2024-02-01 NOTE — Telephone Encounter (Signed)
 See note - patient is aware Dr.Penumalli has not reviewed results yet and will receive a call once they have been reviewed.

## 2024-02-02 NOTE — Telephone Encounter (Signed)
 Spoke with patient, appointment made.

## 2024-02-03 ENCOUNTER — Ambulatory Visit: Payer: Self-pay | Admitting: Neurology

## 2024-02-03 DIAGNOSIS — Z8669 Personal history of other diseases of the nervous system and sense organs: Secondary | ICD-10-CM | POA: Insufficient documentation

## 2024-02-03 DIAGNOSIS — G4730 Sleep apnea, unspecified: Secondary | ICD-10-CM | POA: Insufficient documentation

## 2024-02-03 NOTE — Procedures (Signed)
 [facility] at [facility] POLYSOMNOGRAPHY  with PARASOMNIA MONTAGE -REM BD MONTAGE   STUDY DATE:  01/25/2024     PATIENT NAME:  Paul Ryan         DATE OF BIRTH:  [birth year]  PATIENT ID:  [id number]    TYPE OF STUDY:  PSG   PHYSICIAN: DEDRA GORES, MD REFERRED BY: Eduard Hanlon, MD  SCORING TECHNICIAN: Delon Sprung, RPSGT   HISTORY:  This 78 year-old Male patient was seen on 8-28-205 and presented with more than one possible sleep disorder -his wife noted him acting out dreams and REM BD will need to be evaluated, he has a hsitory of OSA and possibly CSA as well, CPAP did not work for him and he has used a materials engineer. Past medical history :  see Dr Chancy referral note and my consultation from 10-29-2023  ADDITIONAL INFORMATION:  The Epworth Sleepiness Scale was endorsed at only 3 /24 points (scores above or equal to 10 are suggestive of hypersomnolence). FSS endorsed at  15 /63 points. GDS 0/ 15 points.    Height: 71 in Weight: 162 lb (BMI 22) Neck Size: 15 in  MEDICATIONS:  see technician's note .   TECHNICAL DESCRIPTION: A registered sleep technologist ( RPSGT)  was in attendance for the duration of the recording.  Data collection, scoring, video monitoring, and reporting were performed in compliance with the AASM Manual for the Scoring of Sleep and Associated Events; (Hypopnea is scored based on the criteria listed in Section VIII D. 1b in the AASM Manual V2.6 using a 4% oxygen desaturation rule or Hypopnea is scored based on the criteria listed in Section VIII D. 1a in the AASM Manual V2.6 using 3% oxygen desaturation and /or arousal rule).   SLEEP CONTINUITY AND SLEEP ARCHITECTURE:  Lights-out was at 20:43: and lights-on at  05:02:, with  8.3 hours of recording time . Total sleep time ( TST) was 406.0 minutes with a slightly decreased sleep efficiency at 81.9%.  Sleep latency was decreased at 4.0 minutes.  REM sleep latency was increased at 221.0  minutes. Of the total sleep time, the percentage of stage N1 sleep was 4.6%, stage N2 sleep was 90%, stage N3 sleep was 0.0%, and REM sleep was 5.0%.  There were 5 Stage R periods observed on this study night, 42 awakenings (i.e. transitions to Stage W from any sleep stage), and 119 total stage transitions. Wake after sleep onset (WASO) time accounted for 85 minutes.   BODY POSITION:  TST was divided  between the sleep positions as follows: supine 257 minutes (63%), non-supine 149 minutes (37%); right 35 minutes (9%), left 113 minutes (28%), and prone 00 minutes (0%).  Total supine REM sleep time was 00 minutes (0% of total REM sleep).   RESPIRATORY MONITORING:   Based on CMS criteria (using a 4% oxygen desaturation rule for scoring hypopneas), there were 18 apneas (1 obstructive; 17 central; 0 mixed), and 286 hypopneas. these hypopneas were not obstructive but part of a cyclic breathing pattern.    Apnea index was 2.7/h. Hypopnea index was 42.3/h. The apnea-hypopnea index (AHI )  was 44.9/h  overall (62.9/h in  supine, versus 13.7/h in non-supine; AHI was 11.7/ h in  REM, and remarkably 47/h in NREM).  There were 0 respiratory effort-related arousals (RERAs).  There were several occurrences of Cheyne Stokes breathing.  Snoring was classified as very mild  .    OXIMETRY: Oxyhemoglobin Saturation Nadir during sleep was at  71%  from a mean of 95%.   Total sleep time (TST)  in hypoxemia (<89%) was 1.6 minutes, or 0.4% of total sleep time.  LIMB MOVEMENTS: There were 0 periodic limb movements of sleep (0.0/hr), of which 0 (0.0/hr) were associated with an arousal. AROUSAL: There were 132 arousals in total.  Of these, 85 were identified as respiratory-related arousals (13 /h), 0 were PLM-related arousals (0 /h), and 72 were non-specific arousals (11 /h). several 'micro- arousals were seen that I would consider respiratory related .  There were 1-2 minute- long hypopneas without discernible respiratory  effort, associated with a less than 3 % desaturation and followed by a  micro-arousal EEG:  PSG EEG was of normal amplitude and frequency, with symmetric manifestation of sleep stages. EKG: The electrocardiogram documented a regular heart rhythm.  The average heart rate during sleep was 67 bpm.  The heart rate during sleep varied between a minimum of  54 bpm and  a maximum of  96 bpm. AUDIO and VIDEO:  frequent changes in sleep position were noted and 2 bathroom visits took place, only mild snoring. The patient slept with an open mouth. there was very little REM sleep recorded and no PLM-activity captured in REM sleep.   IMPRESSION: 1)Severe central sleep apnea was present. This apnea is central dominant sleep apnea (  the brain doesn't pace the breathing rhythm ). Typical finding is that REM sleep has less apneas than NREM sleep- and that central apnea patients are not overweight . Oxygen desaturations were clustered in supine position which the patient should avoid. Mild snoring was captured while the patient wore his mandibular device.  No evidence of REM behaviour disorder ( dream enactment) was present during the study.   Total sleep time was within normal limits at 406.0 minutes.  Sleep efficiency was decreased at 81.9%.  Sleep fragmentation  was noted- The majority of sleep arousals was related to respiratory events, cyclic breathing and central apnea.   RECOMMENDATIONS: Central apnea cannot be treated by dental device, by hypoglossal nerve stimulation and may not respond to CPAP either. most patients with this type of apnea can be treated by BiPAP/ST or an ASV device. This will require a return for titration study at the sleep lab.  Underlying conditions for central apneas are brainstem strokes, or congestive heart-, liver- or kidney failure.  Neuro-degenerative disorders are another possible cause.   DEDRA GORES,  MD

## 2024-02-05 ENCOUNTER — Other Ambulatory Visit: Admitting: Plastic Surgery

## 2024-02-11 NOTE — Telephone Encounter (Signed)
 Pt has called to schedule the my chart vv with Dr Chalice, he is also on wait list

## 2024-02-15 ENCOUNTER — Telehealth: Payer: Self-pay | Admitting: Neurology

## 2024-02-15 NOTE — Telephone Encounter (Signed)
 CPAP [employer] pending

## 2024-02-16 ENCOUNTER — Other Ambulatory Visit: Payer: Self-pay | Admitting: Family Medicine

## 2024-02-16 NOTE — Telephone Encounter (Signed)
 CPAP Mylene barrows: [id number] (exp. 02/15/24 to 04/30/24) *per Dr. Chalice matt as the tech*   Patient is scheduled at [facility] for 03/13/24 at 9 pm.  Mailed packet and sent mychart

## 2024-02-16 NOTE — Telephone Encounter (Signed)
 CPAP Mylene barrows: [id number] (exp. 02/15/24 to 04/30/24)

## 2024-03-03 DIAGNOSIS — G4731 Primary central sleep apnea: Secondary | ICD-10-CM

## 2024-03-03 HISTORY — DX: Primary central sleep apnea: G47.31

## 2024-03-08 ENCOUNTER — Encounter: Payer: Self-pay | Admitting: Plastic Surgery

## 2024-03-08 ENCOUNTER — Ambulatory Visit (INDEPENDENT_AMBULATORY_CARE_PROVIDER_SITE_OTHER): Payer: Self-pay | Admitting: Plastic Surgery

## 2024-03-08 DIAGNOSIS — Z719 Counseling, unspecified: Secondary | ICD-10-CM

## 2024-03-08 NOTE — Progress Notes (Signed)
 Preoperative Dx: hyperpigmentation of face  Postoperative Dx:  same  Procedure: laser to face   Anesthesia: none  Description of Procedure:  Risks and complications were explained to the patient. Consent was confirmed and signed. Eye protection was placed. Time out was called and all information was confirmed to be correct. The area  area was prepped with alcohol and wiped dry. The Heroic laser was set at 515 nm and preset J/cm2. The face was lasered. The patient tolerated the procedure well and there were no complications. The patient is to follow up in 4 weeks.

## 2024-03-13 ENCOUNTER — Encounter

## 2024-03-24 ENCOUNTER — Ambulatory Visit

## 2024-03-24 VITALS — BP 124/72 | HR 72 | Temp 97.4°F | Ht 71.0 in | Wt 171.0 lb

## 2024-03-24 DIAGNOSIS — Z87891 Personal history of nicotine dependence: Secondary | ICD-10-CM | POA: Diagnosis not present

## 2024-03-24 DIAGNOSIS — G4739 Other sleep apnea: Secondary | ICD-10-CM | POA: Diagnosis not present

## 2024-03-24 DIAGNOSIS — R911 Solitary pulmonary nodule: Secondary | ICD-10-CM | POA: Diagnosis not present

## 2024-03-24 DIAGNOSIS — G4731 Primary central sleep apnea: Secondary | ICD-10-CM

## 2024-03-24 DIAGNOSIS — G4752 REM sleep behavior disorder: Secondary | ICD-10-CM

## 2024-03-24 DIAGNOSIS — G4733 Obstructive sleep apnea (adult) (pediatric): Secondary | ICD-10-CM

## 2024-03-24 DIAGNOSIS — G250 Essential tremor: Secondary | ICD-10-CM

## 2024-03-24 NOTE — Patient Instructions (Signed)
" °  VISIT SUMMARY: During your visit, we discussed your severe sleep apnea, REM sleep behavior disorder, and stable right upper lobe lung nodule. We reviewed your recent sleep study and current treatments, and we have made plans for further evaluation and management of your conditions.  YOUR PLAN: -COMPLEX SLEEP APNEA SYNDROME: Complex sleep apnea syndrome is a condition where you experience both obstructive and central sleep apnea, leading to frequent interruptions in breathing during sleep. Despite using a mandibular advancement device, you still have severe sleep apnea with 45 apnea episodes per hour. We suspect a central sleep apnea component and have ordered a titration sleep study at [facility] to determine the optimal BiPAP settings for you. BiPAP therapy can improve sleep quality and reduce cardiovascular risks. We also recommend side sleeping to help improve your apnea. Depending on the results, we may consider an MRI of your brain if significant central sleep apnea is confirmed.  -REM SLEEP BEHAVIOR DISORDER: REM sleep behavior disorder is a condition where you act out vivid dreams during REM sleep. Your symptoms have resolved after reducing your Cymbalta  dosage from 90 mg to 60 mg, although vivid dreams persist. There is no current evidence of Parkinson's disease, but we advise monitoring for any new symptoms. Continue taking Cymbalta  at 60 mg and follow up with your neurologist if you notice any worsening tremors or new symptoms suggestive of Parkinson's disease.  -STABLE RIGHT UPPER LOBE LUNG NODULE: A lung nodule is a small growth in the lung that is usually benign. Your right upper lobe lung nodule has been stable on CT scans from April 2023 to April 2025, and you have no significant smoking history or respiratory symptoms. We will schedule a follow-up CT scan in 2027 to continue monitoring the nodule's stability.  INSTRUCTIONS: Please follow up with the titration sleep study at [facility]  to determine the optimal BiPAP settings. Continue taking Cymbalta  at 60 mg and monitor for any new symptoms. Schedule a follow-up CT scan in 2027 to monitor the stability of your lung nodule.    Contains text generated by [employer].   "

## 2024-03-24 NOTE — Progress Notes (Signed)
 "  Pulmonology Office Visit   Subjective:  Patient ID: Paul Ryan, male    DOB: 12-22-1945  MRN: [id number]  Referred by: Domenica Harlene LABOR, MD  CC:  Chief Complaint  Patient presents with   Consult    Would like to review sleep study from 01/25/2024.  Would also like to get update on R lung nodule from Dr. Harriet visit April 2025.    HPI Paul Ryan is a 79 y.o. male former smoker with DVT, protein S deficiency, hyperlipidemia, CAD s/p CABG presents for follow-up on his right upper lobe nodule and concern for OSA.  Respective notes from provider reviewed as appropriate to gather relevant information for patient care.   Discussed the use of AI scribe software for clinical note transcription with the patient, who gave verbal consent to proceed.  History of Present Illness   Paul Ryan is a 79 year old male with severe sleep apnea who presents for evaluation of his sleep disorder.  He has a history of severe sleep apnea that has persisted despite the use of a mandibular advancement device for over 25 years. The current adjustable version was provided by a specialist. He reports that a recent sleep study was performed while using the device, and he has been under the care of a sleep neurologist.  He experiences vivid dreams and episodes of acting out dreams, initially thought to be related to REM behavior disorder. These episodes ceased after reducing his Cymbalta  dosage from 90 mg to 60 mg, suggesting a possible link to the medication. He is currently taking Cymbalta  60 mg for neuropathy in his feet.  He has a history of smoking, having smoked less than a pack a day for approximately 11 years, quitting when his daughter was born. He has a right upper lobe stable nodule, monitored with CT scans over the years, which remains stable.  He underwent a triple bypass surgery in May and reports no shortness of breath. He maintains an active lifestyle, walking about three miles a day, and engages in  activities such as sculpting and lecturing. He travels frequently to [place] for work, staying for extended periods.  He has a history of familial tremors, which he attributes to his father.       OSA history: Diagnosed with OSA  25 yrs ago and has been on it since then.  Has been on MAD since then with Dr. Melba. S/S of RBD. Sees sleep neurology.  Plan for titration study.  Lung Health: Functional status: no SOB. And unlimited ET.  Smoking: Ex-smoker.  <1 pk/d x 11 yr Quit 40+ years ago. 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   PAP download compliance data: NA Encore/Airview Pressure: Hours of usage: Days used >4hr: Leak: AHI:  PRIOR TESTS and IMAGING: PSG/HSAT:  NPSG November 2025 with MAD on: AHI 45, supine dominant, Cheyne-Stokes breathing seen.  Minimal REM sleep and no RWA seen.  CT Chest: Cardiac CT 4/23: 6 mm right upper lobe ground glass opacity. CT chest 06/30/2022: Stable 6 mm ground glass upper lobe nodule CT chest 06/2023: Reviewed by me: Stable nodule.  However this time measurement change to 7 mm.  Follow-up recommended in 2 years.  Has dilated bronchi possible early bronchiectasis.  ECHO: 06/2023 > EF 55-60%.       No data to display          Allergies: Patient has no known allergies. 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   Nasal lesion 07/22/2016   Peripheral vascular disease    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   Polymyalgia 03/21/2014   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   Preventative health care 12/17/2010   Shingles 1982   right abdominal wall   Sleep apnea    Staph skin infection 07/22/2016   Thyroid  nodule 11/29/2014   Urinary hesitancy 09/30/2016   Vitamin D  deficiency 10/14/2011   Past Surgical History:  Procedure Laterality Date   CATARACT EXTRACTION Bilateral    COLONOSCOPY     CORONARY ARTERY BYPASS GRAFT N/A 07/03/2023   Procedure: CORONARY ARTERY BYPASS GRAFTING X 3, USING LEFT INTERNAL MAMMARY ARTERY AND ENDOSCOPIC VEIN HARVEST RIGHT GREATER SAPHENOUS VEIN;  Surgeon: Lucas Dorise POUR, MD;  Location: [facility] OR;  Service: Open Heart Surgery;  Laterality: N/A;   INTRAOPERATIVE TRANSESOPHAGEAL ECHOCARDIOGRAM N/A 07/03/2023   Procedure: ECHOCARDIOGRAM, TRANSESOPHAGEAL, INTRAOPERATIVE;  Surgeon: Lucas Dorise POUR, MD;  Location: [facility] OR;  Service: Open Heart Surgery;  Laterality: N/A;   LEFT HEART CATH AND CORONARY ANGIOGRAPHY N/A 06/11/2023   Procedure: LEFT HEART CATH AND CORONARY ANGIOGRAPHY;  Surgeon: Wendel Lurena POUR, MD;  Location: [facility] INVASIVE CV LAB;  Service: Cardiovascular;  Laterality: N/A;   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   4 surgeries   Family History  Problem Relation Age of Onset   Cancer Mother        unknown- everywhere   Cancer Sister        breast   Social History   Socioeconomic History   Marital status: Married    Spouse name: Not on file   Number of children: Not on file   Years of education: Not on file   Highest education level: Not on file  Occupational History   Occupation: retired  Tobacco Use   Smoking status: Former    Current packs/day: 0.00    Average packs/day: 1 pack/day for 20.0 years (20.0 ttl pk-yrs)    Types: Cigarettes    Start date: 03/03/1960    Quit date: 03/03/1980    Years since  quitting: 44.0   Smokeless tobacco: Never  Vaping Use   Vaping status: Never Used  Substance and Sexual Activity   Alcohol use: No    Alcohol/week: 0.0 standard drinks of alcohol   Drug use: No   Sexual activity: Not on file  Other Topics Concern   Not on file  Social History Narrative   Not on file   Social Drivers of Health   Tobacco Use: Medium Risk (03/24/2024)   Patient History    Smoking Tobacco Use: Former    Smokeless Tobacco Use: Never    Passive Exposure: Not on file  Financial Resource Strain: Low Risk (10/08/2023)   Overall Financial Resource Strain (CARDIA)    Difficulty of Paying  Living Expenses: Not hard at all  Food Insecurity: No Food Insecurity (10/08/2023)   Epic    Worried About Programme Researcher, Broadcasting/film/video in the Last Year: Never true    Ran Out of Food in the Last Year: Never true  Transportation Needs: No Transportation Needs (10/08/2023)   Epic    Lack of Transportation (Medical): No    Lack of Transportation (Non-Medical): No  Physical Activity: Sufficiently Active (10/08/2023)   Exercise Vital Sign    Days of Exercise per Week: 7 days    Minutes of Exercise per Session: 50 min  Stress: No Stress Concern Present (10/08/2023)   [employer] of Occupational Health - Occupational Stress Questionnaire    Feeling of Stress: Only a little  Social Connections: Unknown (10/08/2023)   Social Connection and Isolation Panel    Frequency of Communication with Friends and Family: More than three times a week    Frequency of Social Gatherings with Friends and Family: Three times a week    Attends Religious Services: Never    Active Member of Clubs or Organizations: Not on file    Attends Club or Organization Meetings: Not on file    Marital Status: Married  Intimate Partner Violence: Not At Risk (07/10/2023)   Humiliation, Afraid, Rape, and Kick questionnaire    Fear of Current or Ex-Partner: No    Emotionally Abused: No    Physically Abused: No    Sexually Abused: No   Depression (PHQ2-9): Low Risk (10/28/2023)   Depression (PHQ2-9)    PHQ-2 Score: 0  Alcohol Screen: Low Risk (10/08/2023)   Alcohol Screen    Last Alcohol Screening Score (AUDIT): 2  Housing: Low Risk (10/08/2023)   Epic    Unable to Pay for Housing in the Last Year: No    Number of Times Moved in the Last Year: 0    Homeless in the Last Year: No  Utilities: Low Risk (09/22/2023)   Received from [facility]   Utilities    In the past 12 months has the electric, gas, oil, or water company threatened to shut off services in your home? : No  Health Literacy: Adequate Health Literacy (03/03/2023)   B1300 Health Literacy    Frequency of need for help with medical instructions: Never       Objective:  BP 124/72   Pulse 72   Temp (!) 97.4 F (36.3 C) (Temporal)   Ht 5' 11 (1.803 m)   Wt 171 lb (77.6 kg)   SpO2 98% Comment: room air  BMI 23.85 kg/m  BMI Readings from Last 3 Encounters:  03/24/24 23.85 kg/m  10/30/23 22.45 kg/m  10/29/23 22.59 kg/m    Physical Exam: Physical Exam   ENT: Normal mucosa. No hypertrophy of inferior turbinates. Tonsils are normal sized. Modified Mallampati score is normal. PULMONARY: Lungs clear to auscultation bilaterally, no adventitious breath sounds. CARDIOVASCULAR: Regular rate and rhythm, S1 S2 normal, no murmurs. ABDOMEN: Abdomen soft, nontender. Bowel sounds are normal. EXTREMITIES: No peripheral edema noted.       Diagnostic Review:  Last metabolic panel Lab Results  Component Value Date   GLUCOSE 124 (H) 09/30/2023   NA 139 09/30/2023   K 4.9 09/30/2023   CL 106 09/30/2023   CO2 26 09/30/2023   BUN 17 09/30/2023   CREATININE 1.09 09/30/2023   GFR 64.74 09/30/2023   CALCIUM  8.7 09/30/2023   PHOS 2.9 10/10/2013   PROT 6.7 09/30/2023   ALBUMIN  4.0 09/30/2023   BILITOT  0.5 09/30/2023   ALKPHOS 70 09/30/2023   AST 19 09/30/2023   ALT 15 09/30/2023   ANIONGAP 9 07/05/2023         Assessment & Plan:   Assessment &  Plan OSA (obstructive sleep apnea)  Orders:   Bipap titration; Future  RBD (REM behavioral disorder)     Lung nodule seen on imaging study RUL GGO stable since 2023. Will get CT chest in April 2027.     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 Central sleep apnea  seen on previous NPSG. Previous PAP trial intolerable. Risks include hypertension, atrial fibrillation, stroke. BiPAP benefits include improved sleep quality, reduced cardiovascular risks. - Ordered titration sleep study at [facility] for optimal BiPAP settings. - Consider MRI brain if significant central sleep apnea confirmed. - Advised side sleeping to improve apnea. - Discuss BiPAP settings and options post-study.  REM sleep behavior disorder Likely due to Cymbalta . Symptoms resolved after dose reduction. No current episodes of acting out dreams. Vivid dreams persist. No evidence of Parkinson's disease, advised monitoring for symptoms. - Continue Cymbalta  at 60 mg. - Monitor for worsening tremors or new symptoms suggestive of Parkinson's disease. - Follow up with neurologist if Parkinson's symptoms develop.  Stable right upper lobe lung nodule Stable on CT from April 2023 to April 2025. No significant smoking history. No respiratory symptoms. - Schedule follow-up CT scan in 2027 to monitor nodule stability.      Notes from Dr. Brenna 07/2022 and 07/24/23 Dr Ruthell and Dr. Marlow from 8/2025Reviewed as to gather relevant information for patient care and formulating plan.  He/She was counselled about not driving while drowsy which is common side effect of sleep related disorders.   Return for 1 month after CPAP setup.   I personally spent a total of 40 minutes in the care of the patient today including preparing to  see the patient, getting/reviewing separately obtained history, performing a medically appropriate exam/evaluation, counseling and educating, placing orders, documenting clinical information in the EHR, independently interpreting results, and communicating results.   Maddock Finigan, MD     [1]  Current Outpatient Medications:    ACCU-CHEK GUIDE TEST test strip, USE TO CHECK BLOOD SUGAR ONCE DAILY . E11.9, Disp: 100 strip, Rfl: 1   Accu-Chek Softclix Lancets lancets, USE AS DIRECTED TO CHECK SUGAR ONCE DAILY.E11.9, Disp: 100 each, Rfl: 1   acetaminophen  (TYLENOL ) 325 MG tablet, Take 2 tablets (650 mg total) by mouth every 6 (six) hours as needed for mild pain (pain score 1-3)., Disp: , Rfl:    ALPHA LIPOIC ACID PO, Take 1 capsule by mouth daily., Disp: , Rfl:    aspirin  EC 81 MG tablet, Take 1 tablet (81 mg total) by mouth daily. Swallow whole., Disp: , Rfl:    Blood Glucose Monitoring Suppl (ONETOUCH VERIO FLEX SYSTEM) w/Device KIT, Use to check blood sugar once a day. DX E11.9, Disp: 1 kit, Rfl: 0   cetirizine  (ZYRTEC ) 10 MG tablet, Take 10 mg by mouth at bedtime., Disp: , Rfl:    Cholecalciferol (VITAMIN D -3 PO), Take 1 capsule by mouth daily., Disp: , Rfl:    Coenzyme Q10 (COQ10) 100 MG CAPS, Take 1 capsule by mouth daily., Disp: , Rfl:    DULoxetine  (CYMBALTA ) 60 MG capsule,  Take 1 capsule (60 mg total) by mouth in the morning., Disp: 90 capsule, Rfl: 4   Evolocumab  (REPATHA  SURECLICK) 140 MG/ML SOAJ, Inject 140 mg into the skin every 14 (fourteen) days., Disp: 6 mL, Rfl: 3   famotidine  (PEPCID ) 40 MG tablet, Take 40 mg by mouth daily., Disp: , Rfl:    MAGNESIUM  PO, Take 1 capsule by mouth daily., Disp: , Rfl:    Misc Natural Products (MAGIC MUSHROOM MIX) CAPS, Take 1 capsule by mouth daily. 5-in-1 Mushroom Complex, Disp: , Rfl:    pantoprazole  (PROTONIX ) 40 MG tablet, TAKE 1 TABLET BY MOUTH EVERY DAY, Disp: 90 tablet, Rfl: 1   rosuvastatin  (CRESTOR ) 10 MG tablet, Take 1 tablet (10 mg total)  by mouth every evening., Disp: , Rfl:    thyroid  (ARMOUR THYROID ) 60 MG tablet, TAKE 1 TABLET DAILY ON MONDAY, TUESDAY, WEDNESDAY, THURSDAY, FRIDAY, SATURDAY AND 2 TABLETS ON SUNDAYS., Disp: 96 tablet, Rfl: 0   tiZANidine  (ZANAFLEX ) 2 MG tablet, Take 0.5-2 tablets (1-4 mg total) by mouth 2 (two) times daily as needed for muscle spasms., Disp: 30 tablet, Rfl: 1   warfarin (COUMADIN ) 4 MG tablet, TAKE 0.5-1 TABLETS (2-4 MG TOTAL) BY MOUTH SEE ADMIN INSTRUCTIONS. 4 MG ON SUN MON WED FRI AND 2 MG TUES THURS AND SAT, Disp: 90 tablet, Rfl: 1   zinc gluconate 50 MG tablet, Take 50 mg by mouth daily., Disp: , Rfl:   "

## 2024-03-25 ENCOUNTER — Ambulatory Visit: Admitting: *Deleted

## 2024-03-25 VITALS — BP 120/75 | Ht 71.0 in | Wt 169.0 lb

## 2024-03-25 DIAGNOSIS — Z0000 Encounter for general adult medical examination without abnormal findings: Secondary | ICD-10-CM

## 2024-03-25 NOTE — Progress Notes (Signed)
 "  Please attest this visit in the absence of patient primary care provider.   Chief Complaint  Patient presents with   Medicare Wellness     Subjective:   Paul Ryan is a 79 y.o. male who presents for a Medicare Annual Wellness Visit.  Visit info / Clinical Intake: Medicare Wellness Visit Type:: Subsequent Annual Wellness Visit Persons participating in visit and providing information:: patient Medicare Wellness Visit Mode:: Telephone If telephone:: video declined Since this visit was completed virtually, some vitals may be partially provided or unavailable. Missing vitals are due to the limitations of the virtual format.: Unable to obtain vitals - no equipment If Telephone or Video please confirm:: I connected with patient using audio/video enable telemedicine. I verified patient identity with two identifiers, discussed telehealth limitations, and patient agreed to proceed. Patient Location:: home Provider Location:: office Interpreter Needed?: No Pre-visit prep was completed: yes AWV questionnaire completed by patient prior to visit?: no Living arrangements:: lives with spouse/significant other Patient's Overall Health Status Rating: excellent Typical amount of pain: some (has intermittent neuropathy) Does pain affect daily life?: no Are you currently prescribed opioids?: no  Dietary Habits and Nutritional Risks How many meals a day?: 3 (2 meals and a light meal) Eats fruit and vegetables daily?: yes Most meals are obtained by: preparing own meals In the last 2 weeks, have you had any of the following?: none Diabetic:: (!) yes Any non-healing wounds?: no How often do you check your BS?: 1 (once every 2 days) Would you like to be referred to a Nutritionist or for Diabetic Management? : no  Functional Status Activities of Daily Living (to include ambulation/medication): Independent Ambulation: Independent Medication Administration: Independent Home Management (perform basic  housework or laundry): Independent Manage your own finances?: yes Primary transportation is: driving Concerns about vision?: no *vision screening is required for [facility]* (Up to date with Delon Ax) Concerns about hearing?: no  Fall Screening Falls in the past year?: 0 Number of falls in past year: 0 Was there an injury with Fall?: 0 Fall Risk Category Calculator: 0 Patient Fall Risk Level: Low Fall Risk  Fall Risk Patient at Risk for Falls Due to: No Fall Risks Fall risk Follow up: Falls evaluation completed  Home and Transportation Safety: All rugs have non-skid backing?: yes All stairs or steps have railings?: yes (3 steps into and outside of home) Grab bars in the bathtub or shower?: yes Have non-skid surface in bathtub or shower?: yes Good home lighting?: yes Regular seat belt use?: yes Hospital stays in the last year:: (!) yes How many hospital stays:: 1 Reason: CABG  Cognitive Assessment Difficulty concentrating, remembering, or making decisions? : no Will 6CIT or Mini Cog be Completed: yes What year is it?: 0 points What month is it?: 0 points Give patient an address phrase to remember (5 components): [address] About what time is it?: 0 points Count backwards from 20 to 1: 0 points Say the months of the year in reverse: 0 points Repeat the address phrase from earlier: 0 points 6 CIT Score: 0 points  Advance Directives (For Healthcare) Does Patient Have a Medical Advance Directive?: Yes Does patient want to make changes to medical advance directive?: No - Patient declined Type of Advance Directive: Healthcare Power of [place]; Living will Copy of Healthcare Power of Attorney in Chart?: No - copy requested Copy of Living Will in Chart?: No - copy requested  Reviewed/Updated  Reviewed/Updated: Reviewed All (Medical, Surgical, Family, Medications, Allergies, Care  Teams, Patient Goals)    Allergies (verified) Patient has no known allergies.    Current Medications (verified) Outpatient Encounter Medications as of 03/25/2024  Medication Sig   ACCU-CHEK GUIDE TEST test strip USE TO CHECK BLOOD SUGAR ONCE DAILY . E11.9   Accu-Chek Softclix Lancets lancets USE AS DIRECTED TO CHECK SUGAR ONCE DAILY.E11.9   acetaminophen  (TYLENOL ) 325 MG tablet Take 2 tablets (650 mg total) by mouth every 6 (six) hours as needed for mild pain (pain score 1-3).   ALPHA LIPOIC ACID PO Take 1 capsule by mouth daily.   aspirin  EC 81 MG tablet Take 1 tablet (81 mg total) by mouth daily. Swallow whole.   Blood Glucose Monitoring Suppl (ONETOUCH VERIO FLEX SYSTEM) w/Device KIT Use to check blood sugar once a day. DX E11.9   cetirizine  (ZYRTEC ) 10 MG tablet Take 10 mg by mouth at bedtime.   Cholecalciferol (VITAMIN D -3 PO) Take 1 capsule by mouth daily.   Coenzyme Q10 (COQ10) 100 MG CAPS Take 1 capsule by mouth daily.   DULoxetine  (CYMBALTA ) 60 MG capsule Take 1 capsule (60 mg total) by mouth in the morning.   Evolocumab  (REPATHA  SURECLICK) 140 MG/ML SOAJ Inject 140 mg into the skin every 14 (fourteen) days.   famotidine  (PEPCID ) 40 MG tablet Take 40 mg by mouth daily.   MAGNESIUM  PO Take 1 capsule by mouth daily.   Misc Natural Products (MAGIC MUSHROOM MIX) CAPS Take 1 capsule by mouth daily. 5-in-1 Mushroom Complex   pantoprazole  (PROTONIX ) 40 MG tablet TAKE 1 TABLET BY MOUTH EVERY DAY   rosuvastatin  (CRESTOR ) 10 MG tablet Take 1 tablet (10 mg total) by mouth every evening.   thyroid  (ARMOUR THYROID ) 60 MG tablet TAKE 1 TABLET DAILY ON MONDAY, TUESDAY, WEDNESDAY, THURSDAY, FRIDAY, SATURDAY AND 2 TABLETS ON SUNDAYS.   tiZANidine  (ZANAFLEX ) 2 MG tablet Take 0.5-2 tablets (1-4 mg total) by mouth 2 (two) times daily as needed for muscle spasms.   warfarin (COUMADIN ) 4 MG tablet TAKE 0.5-1 TABLETS (2-4 MG TOTAL) BY MOUTH SEE ADMIN INSTRUCTIONS. 4 MG ON SUN MON WED FRI AND 2 MG TUES THURS AND SAT   zinc gluconate 50 MG tablet Take 50 mg by mouth daily.   No  facility-administered encounter medications on file as of 03/25/2024.    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   Hyperglycemia 12/26/2014   Hyperlipidemia    Hypothyroid 06/10/2011   Insomnia 04/24/2016   Left hip pain 12/17/2010   MCI (mild cognitive impairment) 07/22/2016   Nasal lesion 07/22/2016   Peripheral vascular disease    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   Polymyalgia 03/21/2014   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   Preventative health care 12/17/2010   Shingles 1982   right abdominal wall   Sleep apnea    Staph skin infection 07/22/2016   Thyroid  nodule 11/29/2014   Urinary hesitancy 09/30/2016   Vitamin D  deficiency 10/14/2011   Past Surgical  History:  Procedure Laterality Date   CATARACT EXTRACTION Bilateral    COLONOSCOPY     CORONARY ARTERY BYPASS GRAFT N/A 07/03/2023   Procedure: CORONARY ARTERY BYPASS GRAFTING X 3, USING LEFT INTERNAL MAMMARY ARTERY AND ENDOSCOPIC VEIN HARVEST RIGHT GREATER SAPHENOUS VEIN;  Surgeon: Lucas Dorise POUR, MD;  Location: [facility] OR;  Service: Open Heart Surgery;  Laterality: N/A;   INTRAOPERATIVE TRANSESOPHAGEAL ECHOCARDIOGRAM N/A 07/03/2023   Procedure: ECHOCARDIOGRAM, TRANSESOPHAGEAL, INTRAOPERATIVE;  Surgeon: Lucas Dorise POUR, MD;  Location: [facility] OR;  Service: Open Heart Surgery;  Laterality: N/A;   LEFT HEART CATH AND CORONARY ANGIOGRAPHY N/A 06/11/2023   Procedure: LEFT HEART CATH AND CORONARY ANGIOGRAPHY;  Surgeon: Wendel Lurena POUR, MD;   Location: [facility] INVASIVE CV [facility];  Service: Cardiovascular;  Laterality: N/A;   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   4 surgeries   Family History  Problem Relation Age of Onset   Cancer Mother        unknown- everywhere   Cancer Sister        breast   Social History   Occupational History   Occupation: retired  Tobacco Use   Smoking status: Former    Current packs/day: 0.00    Average packs/day: 1 pack/day for 20.0 years (20.0 ttl pk-yrs)    Types: Cigarettes    Start date: 03/03/1960    Quit date: 03/03/1980    Years since quitting: 44.0   Smokeless tobacco: Never  Vaping Use   Vaping status: Never Used  Substance and Sexual Activity   Alcohol use: No    Alcohol/week: 0.0 standard drinks of alcohol   Drug use: No   Sexual activity: Not on file   Tobacco Counseling Counseling given: Not Answered  SDOH Screenings   Food Insecurity: No Food Insecurity (03/25/2024)  Housing: Low Risk (03/25/2024)  Transportation Needs: No Transportation Needs (03/25/2024)  Utilities: Not At Risk (03/25/2024)  Alcohol Screen: Low Risk (10/08/2023)  Depression (PHQ2-9): Low Risk (03/25/2024)  Financial Resource Strain: Low Risk (10/08/2023)  Physical Activity: Sufficiently Active (03/25/2024)  Social Connections: Moderately Integrated (03/25/2024)  Stress: No Stress Concern Present (03/25/2024)  Tobacco Use: Medium Risk (03/25/2024)  Health Literacy: Adequate Health Literacy (03/03/2023)   See flowsheets for full screening details  Depression Screen PHQ 2 & 9 Depression Scale- Over the past 2 weeks, how often have you been bothered by any of the following problems? Little interest or pleasure in doing things: 0 Feeling down, depressed, or hopeless (PHQ Adolescent also includes...irritable): 0 PHQ-2 Total Score: 0 Trouble falling or staying asleep, or sleeping too much: 0 Feeling tired or having little energy: 0 Poor appetite or overeating (PHQ Adolescent also includes...weight loss): 0 Feeling  bad about yourself - or that you are a failure or have let yourself or your family down: 0 Trouble concentrating on things, such as reading the newspaper or watching television (PHQ Adolescent also includes...like school work): 0 Moving or speaking so slowly that other people could have noticed. Or the opposite - being so fidgety or restless that you have been moving around a lot more than usual: 0 Thoughts that you would be better off dead, or of hurting yourself in some way: 0 PHQ-9 Total Score: 0 If you checked off any problems, how difficult have these problems made it for you to do your work, take care of things at home, or get along with other people?: Not difficult at all     Goals Addressed  This Visit's Progress    Continue to walk daily and eat a healthy diet   On track            Objective:    Today's Vitals   03/25/24 1301  BP: 120/75  Weight: 169 lb (76.7 kg)  Height: 5' 11 (1.803 m)   Body mass index is 23.57 kg/m.  Hearing/Vision screen No results found. Immunizations and Health Maintenance Health Maintenance  Topic Date Due   OPHTHALMOLOGY EXAM  03/11/2024   Diabetic kidney evaluation - Urine ACR  04/01/2024   HEMOGLOBIN A1C  04/01/2024   COVID-19 Vaccine (10 - Pfizer risk 2025-26 season) 06/08/2024   Diabetic kidney evaluation - eGFR measurement  09/29/2024   FOOT EXAM  10/07/2024   Medicare Annual Wellness (AWV)  03/25/2025   DTaP/Tdap/Td (7 - Td or Tdap) 04/15/2032   Pneumococcal Vaccine: 50+ Years  Completed   Influenza Vaccine  Completed   Hepatitis C Screening  Completed   Zoster Vaccines- Shingrix  Completed   Meningococcal B Vaccine  Aged Out   Colonoscopy  Discontinued        Assessment/Plan:  This is a routine wellness examination for [place].  Patient Care Team: Domenica Harlene LABOR, MD as PCP - General (Family Medicine) Jeffrie Oneil BROCKS, MD as PCP - Cardiology (Cardiology) Nonie Lonni RAMAN, MD (Ophthalmology) Luke Delon PARAS, PharmD (Inactive) as Pharmacist (Pharmacist) Gershon Donnice SAUNDERS, DPM as Consulting Physician (Podiatry) Dohmeier, Dedra, MD as Consulting Physician (Neurology)  I have personally reviewed and noted the following in the patients chart:   Medical and social history Use of alcohol, tobacco or illicit drugs  Current medications and supplements including opioid prescriptions. Functional ability and status Nutritional status Physical activity Advanced directives List of other physicians Hospitalizations, surgeries, and ER visits in previous 12 months Vitals Screenings to include cognitive, depression, and falls Referrals and appointments  No orders of the defined types were placed in this encounter.  In addition, I have reviewed and discussed with patient certain preventive protocols, quality metrics, and best practice recommendations. A written personalized care plan for preventive services as well as general preventive health recommendations were provided to patient.   Lolita Libra, CMA   03/25/2024   Return in 1 year (on 03/25/2025).  After Visit Summary: (MyChart) Due to this being a telephonic visit, the after visit summary with patients personalized plan was offered to patient via MyChart   Nurse Notes: HM Addressed: DM eye exam completed 03/11/24 in [place], will request record.  "

## 2024-03-25 NOTE — Patient Instructions (Addendum)
 Paul Ryan,  Thank you for taking the time for your Medicare Wellness Visit. I appreciate your continued commitment to your health goals. Please review the care plan we discussed, and feel free to reach out if I can assist you further.  Please note that Annual Wellness Visits do not include a physical exam. Some assessments may be limited, especially if the visit was conducted virtually. If needed, we may recommend an in-person follow-up with your provider.  Goal: Continue to walk daily and eat a healthy diet   Ongoing Care Seeing your primary care provider every 3 to 6 months helps us  monitor your health and provide consistent, personalized care.   PCP: Let us  know who you decide to establish with and we can send records if they are not in the [facility] network. Medicare AWV: You will need to schedule this on or after 03/25/25 with the office you establish with.  Referrals If a referral was made during today's visit and you haven't received any updates within two weeks, please contact the referred provider directly to check on the status.  Recommended Screenings:  Health Maintenance  Topic Date Due   Medicare Annual Wellness Visit  03/02/2024   Eye exam for diabetics  03/11/2024   Kidney health urinalysis for diabetes  04/01/2024   Hemoglobin A1C  04/01/2024   COVID-19 Vaccine (10 - Pfizer risk 2025-26 season) 06/08/2024   Yearly kidney function blood test for diabetes  09/29/2024   Complete foot exam   10/07/2024   DTaP/Tdap/Td vaccine (6 - Td or Tdap) 04/15/2032   Pneumococcal Vaccine for age over 51  Completed   Flu Shot  Completed   Hepatitis C Screening  Completed   Zoster (Shingles) Vaccine  Completed   Meningitis B Vaccine  Aged Out   Colon Cancer Screening  Discontinued       07/03/2023    6:16 AM  Advanced Directives  Does Patient Have a Medical Advance Directive? Yes  Type of Estate Agent of [place];Living will  Does patient want to make  changes to medical advance directive? No - Patient declined  Copy of Healthcare Power of Attorney in Chart? No - copy requested  Once completed and notarized, you may return a copy of your Advanced Directive(s) by either of the following:  Bring a copy of your health care power of attorney and living will to the office to be added to your chart at your convenience. You can mail a copy to [facility] [address] [place], [place] [address] or email to [email or web address]   Vision: Annual vision screenings are recommended for early detection of glaucoma, cataracts, and diabetic retinopathy. These exams can also reveal signs of chronic conditions such as diabetes and high blood pressure.  Dental: Annual dental screenings help detect early signs of oral cancer, gum disease, and other conditions linked to overall health, including heart disease and diabetes.  Please see the attached documents for additional preventive care recommendations.

## 2024-04-05 ENCOUNTER — Other Ambulatory Visit: Payer: Self-pay | Admitting: Family Medicine

## 2024-04-05 ENCOUNTER — Encounter: Payer: Self-pay | Admitting: Family Medicine

## 2024-04-05 DIAGNOSIS — R350 Frequency of micturition: Secondary | ICD-10-CM

## 2024-04-05 DIAGNOSIS — R369 Urethral discharge, unspecified: Secondary | ICD-10-CM

## 2024-04-05 MED ORDER — TACROLIMUS 0.1 % EX OINT: TOPICAL_OINTMENT | Freq: Two times a day (BID) | CUTANEOUS | 1 refills | Status: AC

## 2024-04-28 ENCOUNTER — Ambulatory Visit

## 2024-05-10 ENCOUNTER — Other Ambulatory Visit: Admitting: Plastic Surgery

## 2024-05-26 ENCOUNTER — Ambulatory Visit: Admitting: Cardiology

## 2024-06-01 ENCOUNTER — Ambulatory Visit (HOSPITAL_BASED_OUTPATIENT_CLINIC_OR_DEPARTMENT_OTHER)

## 2024-06-28 ENCOUNTER — Telehealth: Admitting: Neurology

## 2024-07-27 ENCOUNTER — Ambulatory Visit: Admitting: Oncology

## 2024-10-03 ENCOUNTER — Telehealth: Admitting: Diagnostic Neuroimaging
